# Patient Record
Sex: Male | Born: 1950 | Race: White | Hispanic: No | Marital: Married | Smoking: Never smoker
Health system: Southern US, Community
[De-identification: ages and names within clinical notes are randomized; demographics above are authoritative.]

## PROBLEM LIST (undated history)

## (undated) DIAGNOSIS — B159 Hepatitis A without hepatic coma: Secondary | ICD-10-CM

## (undated) DIAGNOSIS — G43909 Migraine, unspecified, not intractable, without status migrainosus: Secondary | ICD-10-CM

## (undated) DIAGNOSIS — E785 Hyperlipidemia, unspecified: Secondary | ICD-10-CM

## (undated) DIAGNOSIS — I10 Essential (primary) hypertension: Secondary | ICD-10-CM

## (undated) DIAGNOSIS — E119 Type 2 diabetes mellitus without complications: Secondary | ICD-10-CM

## (undated) DIAGNOSIS — J342 Deviated nasal septum: Secondary | ICD-10-CM

## (undated) DIAGNOSIS — K635 Polyp of colon: Secondary | ICD-10-CM

## (undated) DIAGNOSIS — N400 Enlarged prostate without lower urinary tract symptoms: Secondary | ICD-10-CM

## (undated) DIAGNOSIS — B009 Herpesviral infection, unspecified: Secondary | ICD-10-CM

## (undated) HISTORY — DX: Essential (primary) hypertension: I10

## (undated) HISTORY — PX: NASAL SEPTUM SURGERY: SHX37

## (undated) HISTORY — DX: Migraine, unspecified, not intractable, without status migrainosus: G43.909

## (undated) HISTORY — DX: Hepatitis a without hepatic coma: B15.9

## (undated) HISTORY — DX: Herpesviral infection, unspecified: B00.9

## (undated) HISTORY — DX: Hyperlipidemia, unspecified: E78.5

## (undated) HISTORY — DX: Type 2 diabetes mellitus without complications: E11.9

## (undated) HISTORY — DX: Deviated nasal septum: J34.2

## (undated) HISTORY — DX: Benign prostatic hyperplasia without lower urinary tract symptoms: N40.0

## (undated) HISTORY — DX: Polyp of colon: K63.5

---

## 2010-02-01 LAB — HM COLONOSCOPY

## 2011-12-20 DIAGNOSIS — N400 Enlarged prostate without lower urinary tract symptoms: Secondary | ICD-10-CM

## 2011-12-20 HISTORY — DX: Benign prostatic hyperplasia without lower urinary tract symptoms: N40.0

## 2012-12-26 ENCOUNTER — Other Ambulatory Visit: Payer: Self-pay

## 2012-12-26 ENCOUNTER — Other Ambulatory Visit: Payer: Self-pay | Admitting: Family Medicine

## 2012-12-26 LAB — COMPLETE METABOLIC PANEL WITH GFR
ALT: 53 U/L (ref 0–53)
AST: 46 U/L — ABNORMAL HIGH (ref 0–37)
Albumin: 4.5 g/dL (ref 3.5–5.2)
Alkaline Phosphatase: 36 U/L — ABNORMAL LOW (ref 39–117)
BUN: 22 mg/dL (ref 6–23)
CO2: 24 mEq/L (ref 19–32)
Calcium: 10.5 mg/dL (ref 8.4–10.5)
Chloride: 105 mEq/L (ref 96–112)
Creat: 1.23 mg/dL (ref 0.50–1.35)
GFR, Est African American: 74 mL/min
GFR, Est Non African American: 64 mL/min
Glucose, Bld: 72 mg/dL (ref 70–99)
Potassium: 4.3 mEq/L (ref 3.5–5.3)
Sodium: 141 mEq/L (ref 135–145)
Total Bilirubin: 0.5 mg/dL (ref 0.3–1.2)
Total Protein: 7.7 g/dL (ref 6.0–8.3)

## 2012-12-26 LAB — CBC WITH DIFFERENTIAL/PLATELET
Basophils Absolute: 0 10*3/uL (ref 0.0–0.1)
Basophils Relative: 0 % (ref 0–1)
Eosinophils Absolute: 0.2 10*3/uL (ref 0.0–0.7)
Eosinophils Relative: 3 % (ref 0–5)
HCT: 39.5 % (ref 39.0–52.0)
Hemoglobin: 13.1 g/dL (ref 13.0–17.0)
Lymphocytes Relative: 24 % (ref 12–46)
Lymphs Abs: 1.7 10*3/uL (ref 0.7–4.0)
MCH: 28.5 pg (ref 26.0–34.0)
MCHC: 33.2 g/dL (ref 30.0–36.0)
MCV: 85.9 fL (ref 78.0–100.0)
Monocytes Absolute: 0.6 10*3/uL (ref 0.1–1.0)
Monocytes Relative: 8 % (ref 3–12)
Neutro Abs: 4.8 10*3/uL (ref 1.7–7.7)
Neutrophils Relative %: 65 % (ref 43–77)
Platelets: 311 10*3/uL (ref 150–400)
RBC: 4.6 MIL/uL (ref 4.22–5.81)
RDW: 14.1 % (ref 11.5–15.5)
WBC: 7.3 10*3/uL (ref 4.0–10.5)

## 2012-12-26 LAB — HEMOGLOBIN A1C
Hgb A1c MFr Bld: 8.6 % — ABNORMAL HIGH (ref <5.7)
Mean Plasma Glucose: 200 mg/dL — ABNORMAL HIGH (ref <117)

## 2012-12-27 LAB — VITAMIN D 25 HYDROXY (VIT D DEFICIENCY, FRACTURES): Vit D, 25-Hydroxy: 40 ng/mL (ref 30–89)

## 2012-12-28 ENCOUNTER — Encounter: Payer: Self-pay | Admitting: *Deleted

## 2013-01-02 ENCOUNTER — Ambulatory Visit (INDEPENDENT_AMBULATORY_CARE_PROVIDER_SITE_OTHER): Payer: BC Managed Care – PPO | Admitting: Family Medicine

## 2013-01-02 ENCOUNTER — Encounter: Payer: Self-pay | Admitting: Family Medicine

## 2013-01-02 VITALS — BP 118/76 | HR 89 | Ht 72.0 in | Wt 209.0 lb

## 2013-01-02 DIAGNOSIS — E663 Overweight: Secondary | ICD-10-CM

## 2013-01-02 DIAGNOSIS — E785 Hyperlipidemia, unspecified: Secondary | ICD-10-CM

## 2013-01-02 DIAGNOSIS — N400 Enlarged prostate without lower urinary tract symptoms: Secondary | ICD-10-CM

## 2013-01-02 DIAGNOSIS — IMO0001 Reserved for inherently not codable concepts without codable children: Secondary | ICD-10-CM

## 2013-01-02 DIAGNOSIS — I10 Essential (primary) hypertension: Secondary | ICD-10-CM

## 2013-01-02 MED ORDER — INSULIN DETEMIR 100 UNIT/ML ~~LOC~~ SOLN: SUBCUTANEOUS | Status: DC

## 2013-01-02 MED ORDER — DAPAGLIFLOZIN PROPANEDIOL 10 MG PO TABS: 10.0000 mg | ORAL_TABLET | Freq: Every day | ORAL | Status: DC

## 2013-01-02 MED ORDER — FENOFIBRATE 160 MG PO TABS: 160.0000 mg | ORAL_TABLET | Freq: Every day | ORAL | Status: DC

## 2013-01-02 MED ORDER — SILODOSIN 8 MG PO CAPS: 8.0000 mg | ORAL_CAPSULE | Freq: Every day | ORAL | Status: AC

## 2013-01-02 MED ORDER — ATORVASTATIN CALCIUM 80 MG PO TABS: 80.0000 mg | ORAL_TABLET | Freq: Every day | ORAL | Status: DC

## 2013-01-02 MED ORDER — LISINOPRIL 20 MG PO TABS: 20.0000 mg | ORAL_TABLET | Freq: Every day | ORAL | Status: DC

## 2013-01-02 MED ORDER — PIOGLITAZONE HCL 45 MG PO TABS: 45.0000 mg | ORAL_TABLET | Freq: Every day | ORAL | Status: DC

## 2013-01-02 MED ORDER — SITAGLIPTIN PHOS-METFORMIN HCL 50-1000 MG PO TABS: 1.0000 | ORAL_TABLET | Freq: Two times a day (BID) | ORAL | Status: DC

## 2013-01-02 MED ORDER — NATEGLINIDE 120 MG PO TABS: 120.0000 mg | ORAL_TABLET | Freq: Three times a day (TID) | ORAL | Status: DC

## 2013-01-02 NOTE — Patient Instructions (Addendum)
1)  Type II DM- Your A1c is too high so you need to work on diet and exercise more.  Let's try adding some [place].  Start with 5 mg and increase to 10 mg. We'll recheck your A1c in 3.5 months.  Also look into the V-Go system.     Diabetes and Exercise Regular exercise is important and can help:   Control blood glucose (sugar).  Decrease blood pressure.    Control blood lipids (cholesterol, triglycerides).  Improve overall health. BENEFITS FROM EXERCISE  Improved fitness.  Improved flexibility.  Improved endurance.  Increased bone density.  Weight control.  Increased muscle strength.  Decreased body fat.  Improvement of the body's use of insulin, a hormone.  Increased insulin sensitivity.  Reduction of insulin needs.  Reduced stress and tension.  Helps you feel better. People with diabetes who add exercise to their lifestyle gain additional benefits, including:  Weight loss.  Reduced appetite.  Improvement of the body's use of blood glucose.  Decreased risk factors for heart disease:  Lowering of cholesterol and triglycerides.  Raising the level of good cholesterol (high-density lipoproteins, HDL).  Lowering blood sugar.  Decreased blood pressure. TYPE 1 DIABETES AND EXERCISE  Exercise will usually lower your blood glucose.  If blood glucose is greater than 240 mg/dl, check urine ketones. If ketones are present, do not exercise.  Location of the insulin injection sites may need to be adjusted with exercise. Avoid injecting insulin into areas of the body that will be exercised. For example, avoid injecting insulin into:  The arms when playing tennis.  The legs when jogging. For more information, discuss this with your caregiver.  Keep a record of:  Food intake.  Type and amount of exercise.  Expected peak times of insulin action.  Blood glucose levels. Do this before, during, and after exercise. Review your records with your caregiver.  This will help you to develop guidelines for adjusting food intake and insulin amounts.  TYPE 2 DIABETES AND EXERCISE  Regular physical activity can help control blood glucose.  Exercise is important because it may:  Increase the body's sensitivity to insulin.  Improve blood glucose control.  Exercise reduces the risk of heart disease. It decreases serum cholesterol and triglycerides. It also lowers blood pressure.  Those who take insulin or oral hypoglycemic agents should watch for signs of hypoglycemia. These signs include dizziness, shaking, sweating, chills, and confusion.  Body water is lost during exercise. It must be replaced. This will help to avoid loss of body fluids (dehydration) or heat stroke. Be sure to talk to your caregiver before starting an exercise program to make sure it is safe for you. Remember, any activity is better than none.  Document Released: 11/27/2003 Document Revised: 11/29/2011 Document Reviewed: 03/13/2009 [facility] Patient Information 2013 [place], [place].

## 2013-01-02 NOTE — Progress Notes (Signed)
Subjective:     Patient ID: Nicholas [name], male   DOB: 1951/04/14, 62 y.o.   MRN: [id number]  HPI Nicholas [name] is here today to discuss the medical conditions listed below.  We are going over his most recent lab results.  Overall, he has done well since his last office visit.  He needs some of his medications refilled.   1)  Hyperlipidemia - He is taking his Lipitor and fenofibrate without difficulty.    2)  Type II DM - He has not been working on his diet as much as he knows that he should.   3)  Stress - He has less stress at home since his daughter and granddaughter have moved back to [place] but he has more stress at work.     4)  Hypertension - His pressure is well controlled on lisinopril.    Review of Systems  Constitutional: Positive for activity change (He has not been as active this winter.  ) and fatigue.  Respiratory: Negative for chest tightness and shortness of breath.   Cardiovascular: Negative for chest pain and leg swelling.  Gastrointestinal: Negative for abdominal pain and blood in stool.  Endocrine: Negative for polydipsia, polyphagia and polyuria.  Genitourinary: Negative for urgency and frequency.  Musculoskeletal: Negative for joint swelling and arthralgias.  Skin: Negative for color change.  Neurological: Negative for numbness.  Hematological: Negative.   Psychiatric/Behavioral:       He has a lot of stress at work.         Objective:   Physical Exam  Constitutional: Vital signs are normal. He appears well-nourished. No distress.  HENT:  Head: Normocephalic.  Eyes: Conjunctivae are normal. No scleral icterus.  Neck: No thyromegaly present.  Cardiovascular: Normal rate, regular rhythm and normal heart sounds.   Pulmonary/Chest: Effort normal and breath sounds normal.  Abdominal: Distention: He carries his weight in his belly.    Musculoskeletal: He exhibits no edema and no tenderness.  Neurological: He is alert.  Skin: Skin is warm and dry.  Psychiatric: His  behavior is normal. Judgment and thought content normal.  Stressed        Assessment:     Type II Diabetes Hyperlipidemia HTN BPH    Plan:   1)  His A1c is higher than last check (8.5 vs 7.7).  He was given refills for Janumet, Starlix, Actos and  Levemir.  We'll add some Farxiga to help get his sugar under better control.  We also discussed the V-Go system.  He was given information to review the system.     2)  Refilled his Lipitor.    3)  Refilled the [place].    4)  He is to work harder on his diet and exercise.    TIME 30 MINUTES:  MORE THAN 50 % OF TIME WAS INVOLVED IN COUNSELING.

## 2013-01-05 ENCOUNTER — Encounter: Payer: Self-pay | Admitting: Family Medicine

## 2013-01-05 DIAGNOSIS — N400 Enlarged prostate without lower urinary tract symptoms: Secondary | ICD-10-CM | POA: Insufficient documentation

## 2013-01-05 DIAGNOSIS — I10 Essential (primary) hypertension: Secondary | ICD-10-CM | POA: Insufficient documentation

## 2013-01-05 DIAGNOSIS — IMO0001 Reserved for inherently not codable concepts without codable children: Secondary | ICD-10-CM | POA: Insufficient documentation

## 2013-01-05 DIAGNOSIS — E663 Overweight: Secondary | ICD-10-CM | POA: Insufficient documentation

## 2013-01-25 ENCOUNTER — Other Ambulatory Visit: Payer: Self-pay | Admitting: Family Medicine

## 2013-01-31 ENCOUNTER — Other Ambulatory Visit: Payer: Self-pay | Admitting: *Deleted

## 2013-01-31 MED ORDER — INSULIN PEN NEEDLE 32G X 6 MM MISC: 100.0000 | Freq: Four times a day (QID) | Status: DC

## 2013-04-03 ENCOUNTER — Other Ambulatory Visit: Payer: Self-pay | Admitting: *Deleted

## 2013-04-03 DIAGNOSIS — E785 Hyperlipidemia, unspecified: Secondary | ICD-10-CM

## 2013-04-04 ENCOUNTER — Other Ambulatory Visit: Payer: BC Managed Care – PPO

## 2013-04-05 LAB — LIPID PANEL
Cholesterol: 84 mg/dL (ref 0–200)
HDL: 29 mg/dL — ABNORMAL LOW (ref >39)
LDL Cholesterol: 34 mg/dL (ref 0–99)
Total CHOL/HDL Ratio: 2.9 Ratio
Triglycerides: 107 mg/dL (ref <150)
VLDL: 21 mg/dL (ref 0–40)

## 2013-04-05 LAB — COMPLETE METABOLIC PANEL WITH GFR
ALT: 43 U/L (ref 0–53)
AST: 39 U/L — ABNORMAL HIGH (ref 0–37)
Albumin: 4.3 g/dL (ref 3.5–5.2)
Alkaline Phosphatase: 31 U/L — ABNORMAL LOW (ref 39–117)
BUN: 23 mg/dL (ref 6–23)
CO2: 26 mEq/L (ref 19–32)
Calcium: 10.2 mg/dL (ref 8.4–10.5)
Chloride: 105 mEq/L (ref 96–112)
Creat: 1.1 mg/dL (ref 0.50–1.35)
GFR, Est African American: 84 mL/min
GFR, Est Non African American: 73 mL/min
Glucose, Bld: 53 mg/dL — ABNORMAL LOW (ref 70–99)
Potassium: 4.6 mEq/L (ref 3.5–5.3)
Sodium: 140 mEq/L (ref 135–145)
Total Bilirubin: 0.5 mg/dL (ref 0.3–1.2)
Total Protein: 7.3 g/dL (ref 6.0–8.3)

## 2013-04-12 ENCOUNTER — Ambulatory Visit (INDEPENDENT_AMBULATORY_CARE_PROVIDER_SITE_OTHER): Payer: BC Managed Care – PPO | Admitting: Family Medicine

## 2013-04-12 ENCOUNTER — Encounter: Payer: Self-pay | Admitting: Family Medicine

## 2013-04-12 VITALS — BP 122/75 | HR 87 | Ht 72.0 in | Wt 195.0 lb

## 2013-04-12 DIAGNOSIS — B009 Herpesviral infection, unspecified: Secondary | ICD-10-CM

## 2013-04-12 DIAGNOSIS — E663 Overweight: Secondary | ICD-10-CM

## 2013-04-12 DIAGNOSIS — R7401 Elevation of levels of liver transaminase levels: Secondary | ICD-10-CM

## 2013-04-12 DIAGNOSIS — E119 Type 2 diabetes mellitus without complications: Secondary | ICD-10-CM | POA: Insufficient documentation

## 2013-04-12 DIAGNOSIS — IMO0001 Reserved for inherently not codable concepts without codable children: Secondary | ICD-10-CM

## 2013-04-12 DIAGNOSIS — E785 Hyperlipidemia, unspecified: Secondary | ICD-10-CM

## 2013-04-12 DIAGNOSIS — I10 Essential (primary) hypertension: Secondary | ICD-10-CM

## 2013-04-12 LAB — POCT GLYCOSYLATED HEMOGLOBIN (HGB A1C): Hemoglobin A1C: 6.3

## 2013-04-12 MED ORDER — FAMCICLOVIR 500 MG PO TABS: ORAL_TABLET | ORAL | Status: DC

## 2013-04-12 MED ORDER — CANAGLIFLOZIN 300 MG PO TABS: 300.0000 | ORAL_TABLET | Freq: Every day | ORAL | Status: DC

## 2013-04-12 NOTE — Assessment & Plan Note (Signed)
He has done great with his weight loss on the NutraSystem program.  He is going to continue with this for another 3 months.

## 2013-04-12 NOTE — Assessment & Plan Note (Signed)
His BP is very well controlled on the Zestril.  If he continues to lose weight, he may need to decrease his dosage to keep from getting lightheaded.  He will do this if needed.

## 2013-04-12 NOTE — Assessment & Plan Note (Signed)
His AST was elevated to 46 at his last check in April.  It is down today to 39 which could be due to his improving his diet and weight loss.  I asked about his alcohol use and he says that he only drinks occasionally.  We discussed that the elevation could be due to a "fatty liver" which may get worse when he stops the Actos.  We'll recheck his level in 3 months.

## 2013-04-12 NOTE — Assessment & Plan Note (Signed)
Refilled his Famvir.

## 2013-04-12 NOTE — Patient Instructions (Addendum)
1)  Type II DM - Hold the Actos and Levemir and change from [place] to Invokana 300 mg daily.     Diabetes and Standards of Medical Care  Diabetes is complicated. You may find that your diabetes team includes a dietitian, nurse, diabetes educator, eye doctor, and more. To help everyone know what is going on and to help you get the care you deserve, the following schedule of care was developed to help keep you on track. Below are the tests, exams, vaccines, medicines, education, and plans you will need. A1c test  Performed at least 2 times a year if you are meeting treatment goals.  Performed 4 times a year if therapy has changed or if you are not meeting treatment goals. Blood pressure test  Performed at every routine medical visit. The goal is less than 120/80 mmHg. Dental exam  Follow up with the dentist regularly. Eye exam  Diagnosed with type 1 diabetes as a child: Get an exam upon reaching the age of 10 years or older and having had diabetes for 3 5 years. Yearly eye exams are recommended after that initial eye exam.  Diagnosed with type 1 diabetes as an adult: Get an exam within 5 years of diagnosis and then yearly.  Diagnosed with type 2 diabetes: Get an exam as soon as possible after the diagnosis and then yearly. Foot care exam  Visual foot exams are performed at every routine medical visit. The exams check for cuts, injuries, or other problems with the feet.  A comprehensive foot exam should be done yearly. This includes visual inspection as well as assessing foot pulses and testing for loss of sensation. Kidney function test (urine microalbumin)  Performed once a year.  Type 1 diabetes: The first test is performed 5 years after diagnosis.  Type 2 diabetes: The first test is performed at the time of diagnosis.  A serum creatinine and estimated glomerular filtration rate (eGFR) test is done once a year to tell the level of chronic kidney disease (CKD), if present. Lipid  profile (Cholesterol, HDL, LDL, Triglycerides)  Performed every 5 years for most people.  The goal for LDL is less than 100 mg/dl. If at high risk, the goal is less than 70 mg/dl.  The goal for HDL is 40 mg/dl 50 mg/dl for men and 50 mg/dl 60 mg/dl for women. An HDL cholesterol of 60 mg/dL or higher gives some protection against heart disease.  The goal for triglycerides is less than 150 mg/dl. Influenza vaccine, pneumococcal vaccine, and hepatitis B vaccine  The influenza vaccine is recommended yearly.  The pneumococcal vaccine is generally given once in a lifetime. However, there are some instances when another vaccination is recommended. Check with your caregiver.  The hepatitis B vaccine is also recommended for adults with diabetes. Diabetes self-management education  Recommended at diagnosis and ongoing as needed. Treatment plan  Reviewed at every medical visit. Document Released: 07/04/2009 Document Revised: 08/23/2012 Document Reviewed: 03/09/2011 [facility] Patient Information 2014 [place], [place].

## 2013-04-12 NOTE — Assessment & Plan Note (Signed)
His A1c is much better since adding [place] and with his watching his diet.  He admits to being nervous about Actos so he is going to hold it for now.  He is rarely using Levemir so he is going to stop this and is to be better about taking his Starlix before meals.  Since he is stopping the Actos and Levemir, we'll change him to [facility] which should give more lowering than the [place].  We'll recheck his A1c in 3 months.

## 2013-04-12 NOTE — Progress Notes (Signed)
Subjective:    Patient ID: Nicholas [name], male    DOB: November 08, 1950, 62 y.o.   MRN: [id number]  HPI  Nicholas [name] is here today to go over his most recent lab results, get medication refills and to follow up on the conditions listed below:  1)  Type II DM:  He is taking a combination of Starlix, Farxiga, Actos and Janumet.  He is only taking his Levemir if he knows that he is going to be eating more carbs than usual.  He is currently following a Nutra-System program for weight loss and has done well with it.    2)  HSV:  He needs a refill on his Famvir which he takes for fever blisters.  3)  Hypertension: His blood pressure continues to be well controlled with his lisinopril.   4)  Cholesterol:  He is taking a combination of Lipitor 80 mg every other day and Fenofibrate daily.     Review of Systems  Constitutional: Negative for fatigue and unexpected weight change (He has lost weight due to improving his diet and increasing his exercise.  ).  HENT: Negative.   Eyes: Positive for visual disturbance (He is up to date on his eye exam).  Respiratory: Negative for chest tightness and shortness of breath.   Cardiovascular: Negative for chest pain, palpitations and leg swelling.  Gastrointestinal: Negative.   Endocrine: Negative for polydipsia, polyphagia and polyuria.  Genitourinary: Negative.   Musculoskeletal: Negative.   Skin: Negative.   Neurological: Negative for light-headedness.  Psychiatric/Behavioral: Negative.    Past Medical History  Diagnosis Date  . Diabetes mellitus without complication     Eye exam - 2011  . Migraine headache   . Hyperlipidemia   . Hypertension   . Deviated septum   . Hepatitis A   . Colon polyp     Dr. Vernell Barrier, it was recommended that he have a follow-up colonoscopy in 2010.  Marland Kitchen BPH (benign prostatic hypertrophy) 12/20/2011    Dr. Cleatrice Burke  . Acute upper respiratory infections of other multiple sites   . HSV-1 (herpes simplex virus 1) infection     Family  History  Problem Relation Age of Onset  . Diabetes Mother   . Neuropathy Mother   . Diabetes Father   . Cancer Father     Lung Cancer  . Diabetes Sister   . Diabetes Brother    History   Social History Narrative   Marital Status:  Married Financial planner)    Children:  Daughter (Nicholas [name]) Sons (Nicholas [name], Nicholas [name])   Pets:  Dogs (2)    Living Situation: Lives with spouse, daughter and granddaughter    Occupation: Psychologist, sport and exercise   Education:  Engineer, maintenance (IT)   Tobacco Use/Exposure:  None    Alcohol Use:  Occasional   Drug Use:  None   Diet:  Regular   Exercise:  None   Hobbies:  Computers      Objective:   Physical Exam  Constitutional: He appears well-developed and well-nourished. No distress.  HENT:  Head: Normocephalic.  Nose: Nose normal.  Mouth/Throat: Oropharynx is clear and moist.  Eyes: Conjunctivae are normal. No scleral icterus.  Neck: Neck supple. No thyromegaly present.  Cardiovascular: Normal rate, regular rhythm and normal heart sounds.   Pulmonary/Chest: Effort normal and breath sounds normal.  Abdominal: Soft. He exhibits no mass. There is no tenderness.  Musculoskeletal: Normal range of motion. He exhibits no edema and no tenderness.  Lymphadenopathy:    He has no cervical  adenopathy.  Neurological: He is alert.  Skin: Skin is warm and dry. No rash noted.  Psychiatric: He has a normal mood and affect. His behavior is normal. Judgment and thought content normal.       Assessment & Plan:

## 2013-04-12 NOTE — Assessment & Plan Note (Signed)
His lipid panel is much lower than it needs to be.  He is currently taking the Lipitor QOD along with the fenofibrate daily.  We discussed him taking the Lipitor 1 per week.  He'll do this and we'll see what his level is at his next check.

## 2013-04-13 LAB — MICROALBUMIN, URINE: Microalb, Ur: 0.5 mg/dL (ref 0.00–1.89)

## 2013-06-25 ENCOUNTER — Other Ambulatory Visit: Payer: Self-pay | Admitting: Family Medicine

## 2013-07-17 ENCOUNTER — Encounter: Payer: Self-pay | Admitting: Family Medicine

## 2013-07-17 ENCOUNTER — Ambulatory Visit (INDEPENDENT_AMBULATORY_CARE_PROVIDER_SITE_OTHER): Payer: BC Managed Care – PPO | Admitting: Family Medicine

## 2013-07-17 VITALS — BP 109/74 | HR 89 | Resp 16 | Wt 200.0 lb

## 2013-07-17 DIAGNOSIS — Z23 Encounter for immunization: Secondary | ICD-10-CM | POA: Insufficient documentation

## 2013-07-17 DIAGNOSIS — E785 Hyperlipidemia, unspecified: Secondary | ICD-10-CM

## 2013-07-17 DIAGNOSIS — R7401 Elevation of levels of liver transaminase levels: Secondary | ICD-10-CM

## 2013-07-17 DIAGNOSIS — E663 Overweight: Secondary | ICD-10-CM

## 2013-07-17 DIAGNOSIS — IMO0001 Reserved for inherently not codable concepts without codable children: Secondary | ICD-10-CM

## 2013-07-17 LAB — POCT GLYCOSYLATED HEMOGLOBIN (HGB A1C): Hemoglobin A1C: 6.8

## 2013-07-17 MED ORDER — DAPAGLIFLOZIN PROPANEDIOL 10 MG PO TABS: 10.0000 mg | ORAL_TABLET | Freq: Every day | ORAL | Status: DC

## 2013-07-17 MED ORDER — ATORVASTATIN CALCIUM 80 MG PO TABS: 80.0000 mg | ORAL_TABLET | Freq: Every day | ORAL | Status: DC

## 2013-07-17 MED ORDER — NATEGLINIDE 120 MG PO TABS: 120.0000 mg | ORAL_TABLET | Freq: Three times a day (TID) | ORAL | Status: DC

## 2013-07-17 MED ORDER — SITAGLIPTIN PHOS-METFORMIN HCL 50-1000 MG PO TABS: 1.0000 | ORAL_TABLET | Freq: Two times a day (BID) | ORAL | Status: DC

## 2013-07-17 MED ORDER — CANAGLIFLOZIN 300 MG PO TABS: 300.0000 | ORAL_TABLET | Freq: Every day | ORAL | Status: DC

## 2013-07-17 NOTE — Assessment & Plan Note (Signed)
We discussed bariatric surgery options.  If he could qualify for the sleeve gastrectomy, this would essentially cure his diabetes.  He will look into this.

## 2013-07-17 NOTE — Assessment & Plan Note (Signed)
The plan was to recheck his LFTs but we decided to wait another 3 months so he can get back on his diet.

## 2013-07-17 NOTE — Assessment & Plan Note (Signed)
Refilled his atorvastatin.

## 2013-07-17 NOTE — Patient Instructions (Signed)
1)  Blood Sugar - You have proven to yourself that if you are really good with diet and exercise then you can control your weight and sugar.  The plan for the next 3 months is Janumet 50/1000 2 x a day plus Starlix 3 x per day and either Farxiga 10 mg or Invokana 300 mg.  You may want to consider reading Dr. Andris Baumann book on "The End of Diabetes".  Try to exercise at least 1 hour per day.  We can recheck your A1c in 3 months.  If you do follow Dr. Rebekah Chesterfield plan, you will definitely need to decrease or even stop your diabetes medications.  Order to remove Starlix, Janumet then [place] or Invokana.     Diabetes and Exercise Diabetes mellitus is a common, chronic disease, in which the pancreas is unable to adequately control blood glucose (sugar) levels. There are 2 types of diabetes. Type 1 diabetes patients are unable to produce insulin, a hormone that causes sugar in the blood to be stored in the body. People with type 1 diabetes may compensate by giving themselves injections of insulin. Type 2 diabetes involves not producing adequate amounts of insulin to control blood glucose levels. People with type 2 diabetes control their blood glucose by monitoring their food intake or by taking medicine. Exercise is an important part of diabetes treatment. During exercise, the muscles use a greater amount of glucose from the blood for energy. This lowers your blood glucose, which is the same effect you would get from taking insulin. It has been shown that endurance athletes are more sensitive to insulin than inactive people. SYMPTOMS  Many people with a mild case of diabetes have no symptoms. However, if left uncontrolled, diabetes can lead to several complications that could be prevented with treatment of the disease. General symptoms of diabetes include:   Frequent urination (polyuria).  Frequent thirst and drinking (polydipsia).  Increased food consumption (polyphagia).  Fatigue.  Poor exercise  performance.  Blurred vision.  Inflammation of the vagina (vaginitis) caused by fungal infections.  Skin infections (uncommon).  Numbness in the feet,caused by nerve injury.  Kidney disease. CAUSES  The cause of most cases of diabetes is unknown. In children, diabetes is often due to an autoimmune response to the cells in the pancreas that make insulin. It is also linked with other diseases, such as cystic fibrosis. Diabetes may have a genetic link. PREVENTION  Athletes should strive to begin exercise with blood glucose in a well-controlled state.  Feet should always be kept clean and dry.  Activities in which low blood sugar levels cannot be treated easily (scuba diving, rock climbing, swimming) should be avoided.  Anticipate alterations in diet or training to avoid low blood sugar (hypoglycemia) and high blood sugar (hyperglycemia).  Athletes should try to increase sugar consumption after strenuous exercise to avoid hypoglycemia.  Short-acting insulin should not be injected into an actively exercising muscle. The athlete should rest the injection site for about 1 hour after exercise.  Patients with diabetes should get routine checkups of the feet to prevent complications. PROGNOSIS  Exercise provides many benefits to the person with diabetes:   Reduced body fat.  Lower blood pressure.  Often, reduced need for medicines.  Improved exercise tolerance.  Lower insulin levels.  Weight loss.  Improved lipid profile (decreased cholesterol and low-density lipoproteins). RELATED COMPLICATIONS  If performed incorrectly, exercise can result in complications of diabetes:   Poor control of blood sugar, when exercise is performed at  the wrong time.  Increase in renal disease, from loss of body fluids (dehydration).  Increased risk of nerve injury (neuropathy) when performing exercises that increase foot injury.  Increased risk of eye problems when performingactivities that  involve breath holding or lowering or jarring the head.  Increased risk of sudden death from exercise in patients with heart disease.  Worsening of hypertension with heavy lifting (more than 10 lb/4.5 kg). Altered blood glucose and insulin dose as a result of mild illness that produces loss of appetite.  Altered uptake of insulin after injection when insulin injection site is changed. NOTE: Exercise can lower blood glucose effectively, but the effects are short-lasting (no more than a couple of days). Exercise has been shown to improve your sensitivity to insulin. This may alter how your body responds to a given dose of injected insulin. It is important for every patient with diabetes to know how his or her body may react to exercise, and to adjust insulin dosages accordingly. TREATMENT  Eat about 1 to 3 hours before exercise.  Check blood glucose immediately before and after exercise.  Stop exercise if blood glucose is more than 250 mg/dL.  Stop exercise if blood glucose is less than 100 mg/dL.  Do not exercise within 1 hour of an insulin injection.  Be prepared to treat low blood glucose while exercising. Keep some sugar product with you, such as a candy bar.  For prolonged exercise, use a sports drink to maintain your glucose level.  Replace used up glucose in the body after exercise.  Consume fluids during and after exercise to avoid dehydration. SEEK MEDICAL CARE IF:  You have vision changes after a run.  You notice a loss of sensation in your feet after exercise.  You have increased numbness, tingling, or pins and needles sensations after exercise.  You have chest pain during or after exercise.  You have a fast, irregular heartbeat (palpitations) during or after exercise.  Your exercise tolerance gets worse.  You have fainting or dizzy spells for brief periods during or after exercise. Document Released: 09/06/2005 Document Revised: 11/29/2011 Document Reviewed:  12/19/2008 [facility] Patient Information 2014 [place], [place].

## 2013-07-17 NOTE — Progress Notes (Signed)
Subjective:    Patient ID: Nicholas [name], male    DOB: 01-16-1951, 62 y.o.   MRN: [id number]  HPI  Nicholas [name] is here today to discuss his Type II Diabetes.  At his last visit, he was eating Nutrisystem and had done great with weight loss and improving his sugars.  His A1c had dropped to 6.3% and he got down to 195 lbs.  He was unable to continue on the program due to some other financial obligations and has gained back some weight (5 lbs).  The plan at his last visit (assuming he was going to continue on his diet) was that he was going to stop the Levemir and Actos and change from [place] to [place].  Since he did not continue on his diet and he had a lot of Actos, he continued taking it in addition to the Janumet 50/1000 and Farxiga 10 mg.  He tried the samples of Invokana and developed hives. He is not sure if this reaction was due to the Invokana or a laundry detergent he was using.  He has a month of Invokana and would like to try it again.     Review of Systems  Constitutional: Positive for unexpected weight change.  HENT: Negative.   Eyes: Negative.   Respiratory: Negative.   Cardiovascular: Negative.   Gastrointestinal: Negative.   Endocrine: Negative.   Genitourinary: Negative.   Musculoskeletal: Negative.   Skin: Negative.   Allergic/Immunologic: Negative.   Neurological: Negative.   Hematological: Negative.   Psychiatric/Behavioral: Negative.     Past Medical History  Diagnosis Date  . Diabetes mellitus without complication     Eye exam - 2011  . Migraine headache   . Hyperlipidemia   . Hypertension   . Deviated septum   . Hepatitis A   . Colon polyp     Dr. Vernell Barrier, it was recommended that he have a follow-up colonoscopy in 2010.  Marland Kitchen BPH (benign prostatic hypertrophy) 12/20/2011    Dr. Cleatrice Burke  . Acute upper respiratory infections of other multiple sites   . HSV-1 (herpes simplex virus 1) infection      Family History  Problem Relation Age of Onset  . Diabetes Mother    . Neuropathy Mother   . Diabetes Father   . Cancer Father     Lung Cancer  . Diabetes Sister   . Diabetes Brother     History   Social History Narrative   Marital Status:  Married Financial planner)    Children:  Daughter (Meta) Sons Burman Riis, [place])   Pets:  Dogs (2)    Living Situation: Lives with spouse, daughter and granddaughter    Occupation: Psychologist, sport and exercise   Education:  Engineer, maintenance (IT)   Tobacco Use/Exposure:  None    Alcohol Use:  Occasional   Drug Use:  None   Diet:  Regular   Exercise:  None   Hobbies:  Computers       Objective:   Physical Exam  Vitals reviewed. Constitutional: He is oriented to person, place, and time. He appears well-developed and well-nourished. No distress.  HENT:  Head: Normocephalic.  Eyes: No scleral icterus.  Neck: Neck supple. No thyromegaly present.  Cardiovascular: Normal rate, regular rhythm and normal heart sounds.   Pulmonary/Chest: Effort normal and breath sounds normal.  Musculoskeletal: Normal range of motion. He exhibits no edema.  Neurological: He is alert and oriented to person, place, and time.  Skin: Skin is warm and dry. No rash noted.  Psychiatric:  He has a normal mood and affect. His behavior is normal. Judgment and thought content normal.          Assessment & Plan:

## 2013-07-17 NOTE — Assessment & Plan Note (Addendum)
His A1c has increased to 6.8%.  He is going to challenge himself again with the Invokana.  He is also going to work harder on diet and exercise.  His medications were refilled.

## 2013-08-23 ENCOUNTER — Encounter: Payer: Self-pay | Admitting: *Deleted

## 2013-10-16 ENCOUNTER — Other Ambulatory Visit: Payer: Self-pay | Admitting: *Deleted

## 2013-10-16 DIAGNOSIS — R5383 Other fatigue: Secondary | ICD-10-CM

## 2013-10-16 DIAGNOSIS — R5381 Other malaise: Secondary | ICD-10-CM

## 2013-10-16 DIAGNOSIS — I10 Essential (primary) hypertension: Secondary | ICD-10-CM

## 2013-10-16 DIAGNOSIS — E785 Hyperlipidemia, unspecified: Secondary | ICD-10-CM

## 2013-10-16 DIAGNOSIS — E119 Type 2 diabetes mellitus without complications: Secondary | ICD-10-CM

## 2013-10-17 ENCOUNTER — Other Ambulatory Visit (INDEPENDENT_AMBULATORY_CARE_PROVIDER_SITE_OTHER): Payer: BC Managed Care – PPO

## 2013-10-17 LAB — COMPLETE METABOLIC PANEL WITH GFR
ALT: 35 U/L (ref 0–53)
AST: 23 U/L (ref 0–37)
Albumin: 4.1 g/dL (ref 3.5–5.2)
Alkaline Phosphatase: 35 U/L — ABNORMAL LOW (ref 39–117)
BUN: 19 mg/dL (ref 6–23)
CO2: 27 mEq/L (ref 19–32)
Calcium: 10 mg/dL (ref 8.4–10.5)
Chloride: 105 mEq/L (ref 96–112)
Creat: 1.27 mg/dL (ref 0.50–1.35)
GFR, Est African American: 71 mL/min
GFR, Est Non African American: 61 mL/min
Glucose, Bld: 188 mg/dL — ABNORMAL HIGH (ref 70–99)
Potassium: 4.6 mEq/L (ref 3.5–5.3)
Sodium: 139 mEq/L (ref 135–145)
Total Bilirubin: 0.4 mg/dL (ref 0.2–1.2)
Total Protein: 7 g/dL (ref 6.0–8.3)

## 2013-10-17 LAB — LIPID PANEL
Cholesterol: 110 mg/dL (ref 0–200)
HDL: 22 mg/dL — ABNORMAL LOW (ref >39)
LDL Cholesterol: 20 mg/dL (ref 0–99)
Total CHOL/HDL Ratio: 5 Ratio
Triglycerides: 340 mg/dL — ABNORMAL HIGH (ref <150)
VLDL: 68 mg/dL — ABNORMAL HIGH (ref 0–40)

## 2013-10-17 LAB — TSH: TSH: 1.117 u[IU]/mL (ref 0.350–4.500)

## 2013-10-17 LAB — CBC WITH DIFFERENTIAL/PLATELET
Basophils Absolute: 0 10*3/uL (ref 0.0–0.1)
Basophils Relative: 0 % (ref 0–1)
Eosinophils Absolute: 0.1 10*3/uL (ref 0.0–0.7)
Eosinophils Relative: 2 % (ref 0–5)
HCT: 41.9 % (ref 39.0–52.0)
Hemoglobin: 14.3 g/dL (ref 13.0–17.0)
Lymphocytes Relative: 25 % (ref 12–46)
Lymphs Abs: 1.6 10*3/uL (ref 0.7–4.0)
MCH: 29.5 pg (ref 26.0–34.0)
MCHC: 34.1 g/dL (ref 30.0–36.0)
MCV: 86.6 fL (ref 78.0–100.0)
Monocytes Absolute: 0.7 10*3/uL (ref 0.1–1.0)
Monocytes Relative: 11 % (ref 3–12)
Neutro Abs: 4.1 10*3/uL (ref 1.7–7.7)
Neutrophils Relative %: 62 % (ref 43–77)
Platelets: 277 10*3/uL (ref 150–400)
RBC: 4.84 MIL/uL (ref 4.22–5.81)
RDW: 13.8 % (ref 11.5–15.5)
WBC: 6.5 10*3/uL (ref 4.0–10.5)

## 2013-10-17 LAB — HEMOGLOBIN A1C
Hgb A1c MFr Bld: 8.2 % — ABNORMAL HIGH (ref <5.7)
Mean Plasma Glucose: 189 mg/dL — ABNORMAL HIGH (ref <117)

## 2013-10-24 ENCOUNTER — Ambulatory Visit (INDEPENDENT_AMBULATORY_CARE_PROVIDER_SITE_OTHER): Payer: BC Managed Care – PPO | Admitting: Family Medicine

## 2013-10-24 ENCOUNTER — Encounter: Payer: Self-pay | Admitting: Family Medicine

## 2013-10-24 VITALS — BP 117/70 | HR 92 | Resp 16 | Ht 72.0 in | Wt 200.0 lb

## 2013-10-24 DIAGNOSIS — R202 Paresthesia of skin: Secondary | ICD-10-CM

## 2013-10-24 DIAGNOSIS — IMO0001 Reserved for inherently not codable concepts without codable children: Secondary | ICD-10-CM

## 2013-10-24 DIAGNOSIS — E1165 Type 2 diabetes mellitus with hyperglycemia: Principal | ICD-10-CM

## 2013-10-24 DIAGNOSIS — R209 Unspecified disturbances of skin sensation: Secondary | ICD-10-CM

## 2013-10-24 DIAGNOSIS — R200 Anesthesia of skin: Secondary | ICD-10-CM

## 2013-10-24 DIAGNOSIS — E781 Pure hyperglyceridemia: Secondary | ICD-10-CM

## 2013-10-24 MED ORDER — GLIPIZIDE ER 10 MG PO TB24: 10.0000 mg | ORAL_TABLET | Freq: Every day | ORAL | Status: DC

## 2013-10-24 MED ORDER — OMEGA-3-ACID ETHYL ESTERS 1 G PO CAPS: 2.0000 g | ORAL_CAPSULE | Freq: Two times a day (BID) | ORAL | Status: DC

## 2013-10-24 MED ORDER — CANAGLIFLOZIN 300 MG PO TABS: 300.0000 | ORAL_TABLET | Freq: Every day | ORAL | Status: DC

## 2013-10-24 NOTE — Progress Notes (Signed)
Subjective:    Patient ID: Nicholas [name], male    DOB: 1951-09-04, 63 y.o.   MRN: [id number]  HPI  Nicholas [name] is here today to go over her most recent lab results and discuss the conditions listed below:   1)  Type II DM - He is currently taking Farxiga (10 mg daily) and Starlix.  He is doing well with both medications.  He needs a refill on it.  He was unsure if he should continue taking Invokana.  He does not remember if he has a prescription for it.    2)  Hand Numbness - He has been experiencing numbness in his left hand and fifth digit for the past three weeks.    3)  Hyperlipidemia - He is doing fine on Lovaza and needs to have it refilled.     Review of Systems  Constitutional: Negative for fatigue and unexpected weight change.  HENT: Negative.   Respiratory: Negative for shortness of breath.   Cardiovascular: Negative for chest pain, palpitations and leg swelling.  Gastrointestinal: Negative.   Genitourinary: Negative.   Musculoskeletal: Negative for myalgias.  Skin: Negative.   Neurological: Positive for numbness.       Left hand   Psychiatric/Behavioral: Negative.     Past Medical History  Diagnosis Date  . Diabetes mellitus without complication     Eye exam - 2011  . Migraine headache   . Hyperlipidemia   . Hypertension   . Deviated septum   . Hepatitis A   . Colon polyp     Dr. Vernell Barrier, it was recommended that he have a follow-up colonoscopy in 2010.  Marland Kitchen BPH (benign prostatic hypertrophy) 12/20/2011    Dr. Cleatrice Burke  . HSV-1 (herpes simplex virus 1) infection      History reviewed. No pertinent past surgical history.   History   Social History Narrative   Marital Status:  Married Financial planner)    Children:  Daughter (Meta) Sons Burman Riis, [place])   Pets:  Dogs (2)    Living Situation: Lives with spouse   Occupation: Psychologist, sport and exercise   Education:  Engineer, maintenance (IT)   Tobacco Use/Exposure:  None    Alcohol Use:  Occasional   Drug Use:  None   Diet:  Regular     Exercise:  None   Hobbies:  Computers     Family History  Problem Relation Age of Onset  . Diabetes Mother   . Neuropathy Mother   . Diabetes Father   . Cancer Father     Lung Cancer  . Diabetes Sister   . Diabetes Brother      Current Outpatient Prescriptions on File Prior to Visit  Medication Sig Dispense Refill  . aspirin (ASPIRIN ADULT LOW STRENGTH) 81 MG EC tablet Take 81 mg by mouth daily. Swallow whole.      Marland Kitchen atorvastatin (LIPITOR) 80 MG tablet Take 1 tablet (80 mg total) by mouth daily.  90 tablet  1  . Dapagliflozin Propanediol (FARXIGA) 10 MG TABS Take 10 mg by mouth daily.  30 tablet  5  . famciclovir (FAMVIR) 500 MG tablet Take 1 tab daily for prevention or 3 tabs at onset of fever blister  30 tablet  3  . fenofibrate 160 MG tablet Take 1 tablet (160 mg total) by mouth daily.  90 tablet  3  . lisinopril (PRINIVIL,ZESTRIL) 20 MG tablet Take 1 tablet (20 mg total) by mouth daily.  90 tablet  3  . nateglinide (STARLIX) 120  MG tablet Take 1 tablet (120 mg total) by mouth 3 (three) times daily before meals.  270 tablet  1  . pioglitazone (ACTOS) 45 MG tablet Take 1 tablet (45 mg total) by mouth daily.  90 tablet  1  . silodosin (RAPAFLO) 8 MG CAPS capsule Take 1 capsule (8 mg total) by mouth daily with breakfast.  90 capsule  3  . sitaGLIPtin-metformin (JANUMET) 50-1000 MG per tablet Take 1 tablet by mouth 2 (two) times daily with a meal.  180 tablet  1   No current facility-administered medications on file prior to visit.     No Known Allergies   Immunization History  Administered Date(s) Administered  . Influenza Whole 10/01/2009, 07/09/2011, 06/27/2012  . Influenza,inj,Quad PF,36+ Mos 07/17/2013  . Td 09/24/2005  . Tdap 06/27/2012        Objective:   Physical Exam  Nursing note and vitals reviewed. Constitutional: He is oriented to person, place, and time. He appears well-nourished. No distress.  HENT:  Head: Normocephalic.  Eyes: No scleral icterus.   Neck: Neck supple. No thyromegaly present.  Cardiovascular: Normal rate, regular rhythm and normal heart sounds.  Exam reveals no gallop and no friction rub.   No murmur heard. Pulmonary/Chest: Breath sounds normal. No respiratory distress. He exhibits no tenderness.  Musculoskeletal: He exhibits no edema.  Neurological: He is alert and oriented to person, place, and time.  Skin: Skin is warm and dry. No rash noted.  Psychiatric: He has a normal mood and affect. His behavior is normal. Judgment and thought content normal.      Assessment & Plan:    Nicholas [name] was seen today for medication management.  Diagnoses and associated orders for this visit:  Type II or unspecified type diabetes mellitus without mention of complication, uncontrolled - Canagliflozin (INVOKANA) 300 MG TABS; Take 300 tablets (90,000 mg total) by mouth daily. - glipiZIDE (GLUCOTROL XL) 10 MG 24 hr tablet; Take 1 tablet (10 mg total) by mouth daily with breakfast.  High triglycerides - omega-3 acid ethyl esters (LOVAZA) 1 G capsule; Take 2 capsules (2 g total) by mouth 2 (two) times daily.  Numbness and tingling in left hand   TIME SPENT "FACE TO FACE" WITH PATIENT -  30 MINS

## 2013-10-24 NOTE — Patient Instructions (Signed)
1)  Cholesterol - Decrease your atorvastatin to 1 per week; Continue on the fenofibrate and add back the Lovaza to get your triglycerides down.    2)  Blood Sugar -  We are changing you from [place] back to Invokana and changing you from Starlix before every meal to Glucotrol XL in the am.  You will continue on Janumet 2 x per day and you may also add back the Actos 1/2 tab if you decide not to do Nutri System.  Continue to work on diet and exercise. Get your eyes checked.     Diabetes and Exercise Exercising regularly is important. It is not just about losing weight. It has many health benefits, such as:  Improving your overall fitness, flexibility, and endurance.  Increasing your bone density.  Helping with weight control.  Decreasing your body fat.  Increasing your muscle strength.  Reducing stress and tension.  Improving your overall health. People with diabetes who exercise gain additional benefits because exercise:  Reduces appetite.  Improves the body's use of blood sugar (glucose).  Helps lower or control blood glucose.  Decreases blood pressure.  Helps control blood lipids (such as cholesterol and triglycerides).  Improves the body's use of the hormone insulin by:  Increasing the body's insulin sensitivity.  Reducing the body's insulin needs.  Decreases the risk for heart disease because exercising:  Lowers cholesterol and triglycerides levels.  Increases the levels of good cholesterol (such as high-density lipoproteins [HDL]) in the body.  Lowers blood glucose levels. YOUR ACTIVITY PLAN  Choose an activity that you enjoy and set realistic goals. Your health care provider or diabetes educator can help you make an activity plan that works for you. You can break activities into 2 or 3 sessions throughout the day. Doing so is as good as one long session. Exercise ideas include:  Taking the dog for a walk.  Taking the stairs instead of the elevator.  Dancing  to your favorite song.  Doing your favorite exercise with a friend. RECOMMENDATIONS FOR EXERCISING WITH TYPE 1 OR TYPE 2 DIABETES   Check your blood glucose before exercising. If blood glucose levels are greater than 240 mg/dL, check for urine ketones. Do not exercise if ketones are present.  Avoid injecting insulin into areas of the body that are going to be exercised. For example, avoid injecting insulin into:  The arms when playing tennis.  The legs when jogging.  Keep a record of:  Food intake before and after you exercise.  Expected peak times of insulin action.  Blood glucose levels before and after you exercise.  The type and amount of exercise you have done.  Review your records with your health care provider. Your health care provider will help you to develop guidelines for adjusting food intake and insulin amounts before and after exercising.  If you take insulin or oral hypoglycemic agents, watch for signs and symptoms of hypoglycemia. They include:  Dizziness.  Shaking.  Sweating.  Chills.  Confusion.  Drink plenty of water while you exercise to prevent dehydration or heat stroke. Body water is lost during exercise and must be replaced.  Talk to your health care provider before starting an exercise program to make sure it is safe for you. Remember, almost any type of activity is better than none. Document Released: 11/27/2003 Document Revised: 05/09/2013 Document Reviewed: 02/13/2013 [facility] Patient Information 2014 [place], [place].  Insulin Resistance Blood sugar (glucose) levels are controlled by a hormone called insulin. Insulin is made by  your pancreas. When your blood glucose goes up, insulin is released into your blood. Insulin is required for your body to function normally. However, your body can become resistant to your own insulin or to insulin given to treat diabetes. In either case, insulin resistance can lead to serious problems. These problems  include:  Type 2 diabetes.  Heart disease.  High blood pressure.  Stroke.  Polycystic ovary syndrome.  Fatty liver. CAUSES  Insulin resistance can develop for many different reasons. It is more likely to happen in people with these conditions or characteristics:  Obesity.  Inactivity.  Pregnancy.  High blood pressure.  Stress.  Steroid use.  Infection or severe illness.  Increased levels of cholesterol and triglycerides. SYMPTOMS  There are no symptoms. You may have symptoms related to the various complications of insulin resistance.  DIAGNOSIS  Several different things can make your caregiver suspect you have insulin resistance. These include:  High blood glucose (hyperglycemia).  Abnormal cholesterol levels.  High uric acid levels.  Changes related to blood pressure.  Changes related to inflammation. Insulin resistance can be determined with blood tests. An elevated insulin level when you have not eaten might suggest resistance. Other more complicated tests are sometimes necessary. TREATMENT  Lifestyle changes are the most important treatment for insulin resistance.   If you are overweight and you have insulin resistance, you can improve your insulin sensitivity by losing weight.  Moderate exercise for 30 40 minutes, 4 days a week, can improve insulin sensitivity. Some medicines can also help improve your insulin sensitivity. Your caregiver can discuss these with you if they are appropriate.  HOME CARE INSTRUCTIONS   Do not smoke.  Keep your weight at a healthy level.  Get exercise.  If you have diabetes, follow your caregiver's directions.  If you have high blood pressure, follow your caregiver's directions.  Only take prescription medicines for pain, fever, or discomfort as directed by your caregiver. SEEK MEDICAL CARE IF:   You are diabetic and you are having problems keeping your blood glucose levels at target range.  You are having episodes  of low blood glucose (hypoglycemia).  You feel you might be having side effects from your medicines.  You have symptoms of an illness that is not improving after 3 4 days.  You have a sore or wound that is not healing.  You notice a change in vision or a new problem with your vision. SEEK IMMEDIATE MEDICAL CARE IF:   Your blood glucose goes below 70, especially if you have confusion, lightheadedness, or other symptoms with it.  Your blood glucose is very high (as advised by your caregiver) twice in a row.  You pass out.  You have chest pain or trouble breathing.  You have a sudden, severe headache.  You have sudden weakness in one arm or one leg.  You have sudden difficulty speaking or swallowing.  You develop vomiting or diarrhea that is getting worse or not improving after 1 day. Document Released: 10/26/2005 Document Revised: 03/07/2012 Document Reviewed: 02/15/2013 [facility] Patient Information 2014 [place], [place].

## 2013-12-03 ENCOUNTER — Other Ambulatory Visit: Payer: Self-pay | Admitting: Family Medicine

## 2014-01-22 ENCOUNTER — Encounter: Payer: Self-pay | Admitting: Family Medicine

## 2014-01-22 ENCOUNTER — Ambulatory Visit (INDEPENDENT_AMBULATORY_CARE_PROVIDER_SITE_OTHER): Payer: BC Managed Care – PPO | Admitting: Family Medicine

## 2014-01-22 ENCOUNTER — Encounter (INDEPENDENT_AMBULATORY_CARE_PROVIDER_SITE_OTHER): Payer: Self-pay

## 2014-01-22 VITALS — BP 136/76 | HR 83 | Resp 16 | Ht 73.0 in | Wt 206.0 lb

## 2014-01-22 DIAGNOSIS — E781 Pure hyperglyceridemia: Secondary | ICD-10-CM

## 2014-01-22 DIAGNOSIS — I10 Essential (primary) hypertension: Secondary | ICD-10-CM

## 2014-01-22 DIAGNOSIS — E1165 Type 2 diabetes mellitus with hyperglycemia: Secondary | ICD-10-CM

## 2014-01-22 DIAGNOSIS — E785 Hyperlipidemia, unspecified: Secondary | ICD-10-CM

## 2014-01-22 DIAGNOSIS — IMO0001 Reserved for inherently not codable concepts without codable children: Secondary | ICD-10-CM

## 2014-01-22 DIAGNOSIS — B009 Herpesviral infection, unspecified: Secondary | ICD-10-CM

## 2014-01-22 LAB — POCT GLYCOSYLATED HEMOGLOBIN (HGB A1C): Hemoglobin A1C: 7.4

## 2014-01-22 MED ORDER — PIOGLITAZONE HCL 45 MG PO TABS: 45.0000 mg | ORAL_TABLET | Freq: Every day | ORAL | Status: DC

## 2014-01-22 MED ORDER — FENOFIBRATE 160 MG PO TABS: 160.0000 mg | ORAL_TABLET | Freq: Every day | ORAL | Status: AC

## 2014-01-22 MED ORDER — LISINOPRIL 20 MG PO TABS: 20.0000 mg | ORAL_TABLET | Freq: Every day | ORAL | Status: DC

## 2014-01-22 MED ORDER — SITAGLIPTIN PHOS-METFORMIN HCL 50-1000 MG PO TABS: 1.0000 | ORAL_TABLET | Freq: Two times a day (BID) | ORAL | Status: DC

## 2014-01-22 MED ORDER — FAMCICLOVIR 500 MG PO TABS: ORAL_TABLET | ORAL | Status: AC

## 2014-01-22 NOTE — Patient Instructions (Signed)
1)  Type II DM - Your A1c has improved a little 7.4% down from 8.2%.  Consider getting Dr. Rebekah ChesterfieldFuhrman's book "The End of Diabetes" and follow it exclusively for 3 months to see what you can do about coming off of some of your medications.  Increase your exercise.    Diabetes and Exercise Exercising regularly is important. It is not just about losing weight. It has many health benefits, such as:  Improving your overall fitness, flexibility, and endurance.  Increasing your bone density.  Helping with weight control.  Decreasing your body fat.  Increasing your muscle strength.  Reducing stress and tension.  Improving your overall health. People with diabetes who exercise gain additional benefits because exercise:  Reduces appetite.  Improves the body's use of blood sugar (glucose).  Helps lower or control blood glucose.  Decreases blood pressure.  Helps control blood lipids (such as cholesterol and triglycerides).  Improves the body's use of the hormone insulin by:  Increasing the body's insulin sensitivity.  Reducing the body's insulin needs.  Decreases the risk for heart disease because exercising:  Lowers cholesterol and triglycerides levels.  Increases the levels of good cholesterol (such as high-density lipoproteins [HDL]) in the body.  Lowers blood glucose levels. YOUR ACTIVITY PLAN  Choose an activity that you enjoy and set realistic goals. Your health care provider or diabetes educator can help you make an activity plan that works for you. You can break activities into 2 or 3 sessions throughout the day. Doing so is as good as one long session. Exercise ideas include:  Taking the dog for a walk.  Taking the stairs instead of the elevator.  Dancing to your favorite song.  Doing your favorite exercise with a friend. RECOMMENDATIONS FOR EXERCISING WITH TYPE 1 OR TYPE 2 DIABETES   Check your blood glucose before exercising. If blood glucose levels are greater than  240 mg/dL, check for urine ketones. Do not exercise if ketones are present.  Avoid injecting insulin into areas of the body that are going to be exercised. For example, avoid injecting insulin into:  The arms when playing tennis.  The legs when jogging.  Keep a record of:  Food intake before and after you exercise.  Expected peak times of insulin action.  Blood glucose levels before and after you exercise.  The type and amount of exercise you have done.  Review your records with your health care provider. Your health care provider will help you to develop guidelines for adjusting food intake and insulin amounts before and after exercising.  If you take insulin or oral hypoglycemic agents, watch for signs and symptoms of hypoglycemia. They include:  Dizziness.  Shaking.  Sweating.  Chills.  Confusion.  Drink plenty of water while you exercise to prevent dehydration or heat stroke. Body water is lost during exercise and must be replaced.  Talk to your health care provider before starting an exercise program to make sure it is safe for you. Remember, almost any type of activity is better than none. Document Released: 11/27/2003 Document Revised: 05/09/2013 Document Reviewed: 02/13/2013 [facility] Patient Information 2014 [place], [place].

## 2014-01-22 NOTE — Progress Notes (Signed)
Subjective:    Patient ID: Nicholas [name], male    DOB: July 13, 1951, 63 y.o.   MRN: [id number]  HPI  Nicholas [name] is here today to get medicarion refills and to discuss the conditions listed below:   1)  Type II DM - He is currently taking Invokana and Janumet. He hasn't been checking his sugars at home.  2)  Hyperlipidemia - He is doing fine on Lovaza and Lipitor and needs to have it refilled.   3)  Blood Pressure - He is currently taking Lisinopril.  He would like refills.   4)  Fever blisters - He is currently taking Famvir  He is doing well.  He recently had a fever blister 3 weeks ago.     Review of Systems  Constitutional: Negative for fatigue and unexpected weight change.  HENT: Negative.   Respiratory: Negative for shortness of breath.   Cardiovascular: Negative for chest pain, palpitations and leg swelling.  Gastrointestinal: Negative.   Genitourinary: Negative.   Musculoskeletal: Negative for myalgias.  Skin: Negative.   Psychiatric/Behavioral: Negative.   All other systems reviewed and are negative.    Past Medical History  Diagnosis Date  . Diabetes mellitus without complication     Eye exam - 2011  . Migraine headache   . Hyperlipidemia   . Hypertension   . Deviated septum   . Hepatitis A   . Colon polyp     Dr. Vernell Barrierraelos, it was recommended that he have a follow-up colonoscopy in 2010.  Marland Kitchen. BPH (benign prostatic hypertrophy) 12/20/2011    Dr. Cleatrice Burkeoughlin  . HSV-1 (herpes simplex virus 1) infection      History reviewed. No pertinent past surgical history.   History   Social History Narrative   Marital Status:  Married Financial planner([place])    Children:  Daughter (Meta) Sons Burman Riis([place], [place])   Pets:  Dogs (2)    Living Situation: Lives with spouse   Occupation: Psychologist, sport and exerciseystem Analyst Programmer   Education:  Engineer, maintenance (IT)College Graduate   Tobacco Use/Exposure:  None    Alcohol Use:  Occasional   Drug Use:  None   Diet:  Regular   Exercise:  None   Hobbies:  Computers     Family History    Problem Relation Age of Onset  . Diabetes Mother   . Neuropathy Mother   . Diabetes Father   . Cancer Father     Lung Cancer  . Diabetes Sister   . Diabetes Brother      Current Outpatient Prescriptions on File Prior to Visit  Medication Sig Dispense Refill  . aspirin (ASPIRIN ADULT LOW STRENGTH) 81 MG EC tablet Take 81 mg by mouth daily. Swallow whole.      Marland Kitchen. atorvastatin (LIPITOR) 80 MG tablet Take 1 tablet (80 mg total) by mouth daily.  90 tablet  1  . Canagliflozin (INVOKANA) 300 MG TABS Take 300 tablets (90,000 mg total) by mouth daily.  90 tablet  1  . glipiZIDE (GLUCOTROL XL) 10 MG 24 hr tablet Take 1 tablet (10 mg total) by mouth daily with breakfast.  90 tablet  1  . omega-3 acid ethyl esters (LOVAZA) 1 G capsule Take 2 capsules (2 g total) by mouth 2 (two) times daily.  360 capsule  1   No current facility-administered medications on file prior to visit.     No Known Allergies   Immunization History  Administered Date(s) Administered  . Influenza Whole 10/01/2009, 07/09/2011, 06/27/2012  . Influenza,inj,Quad PF,36+ Mos 07/17/2013  .  Td 09/24/2005  . Tdap 06/27/2012       Objective:   Physical Exam  Nursing note and vitals reviewed. Constitutional: He is oriented to person, place, and time. He appears well-nourished. No distress.  HENT:  Head: Normocephalic.  Eyes: No scleral icterus.  Neck: Neck supple. No thyromegaly present.  Cardiovascular: Normal rate, regular rhythm and normal heart sounds.  Exam reveals no gallop and no friction rub.   No murmur heard. Pulmonary/Chest: Breath sounds normal. No respiratory distress. He exhibits no tenderness.  Musculoskeletal: He exhibits no edema.  Neurological: He is alert and oriented to person, place, and time.  Skin: Skin is warm and dry. No rash noted.  Psychiatric: He has a normal mood and affect. His behavior is normal. Judgment and thought content normal.      Assessment & Plan:  Nicholas [name] was seen today for  medication management.  Diagnoses and associated orders for this visit:  Other and unspecified hyperlipidemia  Essential hypertension, benign - lisinopril (PRINIVIL,ZESTRIL) 20 MG tablet; Take 1 tablet (20 mg total) by mouth daily.  HSV-1 infection - famciclovir (FAMVIR) 500 MG tablet; Take 1 tab daily for prevention or 3 tabs at onset of fever blister  Type II or unspecified type diabetes mellitus without mention of complication, uncontrolled - POCT HgB A1C - sitaGLIPtin-metformin (JANUMET) 50-1000 MG per tablet; Take 1 tablet by mouth 2 (two) times daily with a meal. - pioglitazone (ACTOS) 45 MG tablet; Take 1 tablet (45 mg total) by mouth daily.  High triglycerides - fenofibrate 160 MG tablet; Take 1 tablet (160 mg total) by mouth daily.   TIME SPENT "FACE TO FACE" WITH PATIENT -  30 MINS

## 2014-03-21 ENCOUNTER — Other Ambulatory Visit: Payer: Self-pay | Admitting: *Deleted

## 2014-03-21 DIAGNOSIS — I10 Essential (primary) hypertension: Secondary | ICD-10-CM

## 2014-03-21 DIAGNOSIS — E785 Hyperlipidemia, unspecified: Secondary | ICD-10-CM

## 2014-03-25 ENCOUNTER — Other Ambulatory Visit: Payer: BC Managed Care – PPO

## 2014-03-25 LAB — COMPLETE METABOLIC PANEL WITH GFR
ALT: 35 U/L (ref 0–53)
AST: 24 U/L (ref 0–37)
Albumin: 4.5 g/dL (ref 3.5–5.2)
Alkaline Phosphatase: 34 U/L — ABNORMAL LOW (ref 39–117)
BUN: 35 mg/dL — ABNORMAL HIGH (ref 6–23)
CO2: 28 mEq/L (ref 19–32)
Calcium: 10.2 mg/dL (ref 8.4–10.5)
Chloride: 104 mEq/L (ref 96–112)
Creat: 1.16 mg/dL (ref 0.50–1.35)
GFR, Est African American: 79 mL/min
GFR, Est Non African American: 68 mL/min
Glucose, Bld: 142 mg/dL — ABNORMAL HIGH (ref 70–99)
Potassium: 4.9 mEq/L (ref 3.5–5.3)
Sodium: 140 mEq/L (ref 135–145)
Total Bilirubin: 0.4 mg/dL (ref 0.2–1.2)
Total Protein: 7.2 g/dL (ref 6.0–8.3)

## 2014-03-25 LAB — CBC WITH DIFFERENTIAL/PLATELET
Basophils Absolute: 0 10*3/uL (ref 0.0–0.1)
Basophils Relative: 0 % (ref 0–1)
Eosinophils Absolute: 0.2 10*3/uL (ref 0.0–0.7)
Eosinophils Relative: 3 % (ref 0–5)
HCT: 41.1 % (ref 39.0–52.0)
Hemoglobin: 14.1 g/dL (ref 13.0–17.0)
Lymphocytes Relative: 34 % (ref 12–46)
Lymphs Abs: 2.3 10*3/uL (ref 0.7–4.0)
MCH: 29.3 pg (ref 26.0–34.0)
MCHC: 34.3 g/dL (ref 30.0–36.0)
MCV: 85.3 fL (ref 78.0–100.0)
Monocytes Absolute: 0.7 10*3/uL (ref 0.1–1.0)
Monocytes Relative: 10 % (ref 3–12)
Neutro Abs: 3.6 10*3/uL (ref 1.7–7.7)
Neutrophils Relative %: 53 % (ref 43–77)
Platelets: 284 10*3/uL (ref 150–400)
RBC: 4.82 MIL/uL (ref 4.22–5.81)
RDW: 14.3 % (ref 11.5–15.5)
WBC: 6.7 10*3/uL (ref 4.0–10.5)

## 2014-03-25 LAB — LIPID PANEL
Cholesterol: 153 mg/dL (ref 0–200)
HDL: 27 mg/dL — ABNORMAL LOW (ref >39)
LDL Cholesterol: 63 mg/dL (ref 0–99)
Total CHOL/HDL Ratio: 5.7 Ratio
Triglycerides: 313 mg/dL — ABNORMAL HIGH (ref <150)
VLDL: 63 mg/dL — ABNORMAL HIGH (ref 0–40)

## 2014-04-01 ENCOUNTER — Encounter: Payer: Self-pay | Admitting: Family Medicine

## 2014-04-01 ENCOUNTER — Ambulatory Visit (INDEPENDENT_AMBULATORY_CARE_PROVIDER_SITE_OTHER): Payer: BC Managed Care – PPO | Admitting: Family Medicine

## 2014-04-01 VITALS — BP 130/69 | HR 86 | Resp 16 | Ht 73.0 in | Wt 206.0 lb

## 2014-04-01 DIAGNOSIS — I10 Essential (primary) hypertension: Secondary | ICD-10-CM

## 2014-04-01 DIAGNOSIS — E781 Pure hyperglyceridemia: Secondary | ICD-10-CM

## 2014-04-01 DIAGNOSIS — IMO0001 Reserved for inherently not codable concepts without codable children: Secondary | ICD-10-CM

## 2014-04-01 DIAGNOSIS — E785 Hyperlipidemia, unspecified: Secondary | ICD-10-CM

## 2014-04-01 DIAGNOSIS — E1165 Type 2 diabetes mellitus with hyperglycemia: Secondary | ICD-10-CM

## 2014-04-01 MED ORDER — GLIPIZIDE ER 10 MG PO TB24: 10.0000 mg | ORAL_TABLET | Freq: Every day | ORAL | Status: DC

## 2014-04-01 MED ORDER — OMEGA-3-ACID ETHYL ESTERS 1 G PO CAPS: 2.0000 g | ORAL_CAPSULE | Freq: Two times a day (BID) | ORAL | Status: DC

## 2014-04-01 MED ORDER — CRESTOR 40 MG PO TABS: 40.0000 mg | ORAL_TABLET | Freq: Every day | ORAL | Status: AC

## 2014-04-01 MED ORDER — CANAGLIFLOZIN 300 MG PO TABS: 300.0000 | ORAL_TABLET | Freq: Every day | ORAL | Status: AC

## 2014-04-01 NOTE — Patient Instructions (Signed)
1)  Cholesterol -   We are changing you from atorvastatin to Crestor 20 mg so take 1/2 of the 40 mg.  You will remain on the fenofibrate along with the Lovaza.   If you do this and follow Dr. Rebekah ChesterfieldFuhrman's plan, you should get your triglycerides back to how they were after you did the HCG diet.   2)  Blood Sugar - Your FBS was 40 points lower so I anticipate that your A1c will also be better. We'll recheck it in 3 months.      Triglycerides, TG, TRIG This is a test to check your risk of developing heart disease. It is often done as part of a lipid profile during a regular medical exam or if you are being treated for high triglycerides. This test measures the amount of triglycerides in your blood. Triglycerides are the body's storage form for fat. Most triglycerides are found in fat tissue. Some triglycerides circulate in the blood to provide fuel for muscles to work. Extra triglycerides are found in the blood after eating a meal when fat is being sent from the gut to fat tissue for storage. The test for triglycerides should be done when you are fasting and no extra triglycerides from a recent meal are present.  SAMPLE COLLECTION The test for triglycerides uses a blood sample. Most often, the blood sample is collected using a needle to collect blood from a vein. Sometimes triglycerides are measured using a drop of blood collected by puncturing the skin on a finger. Testing should be done when you are fasting. For 12 to 14 hours before the test, only water is permitted. In addition, alcohol should not be consumed for the 24 hours just before the test. If you are diabetic and your blood sugar is out of control, triglycerides will be very high. NORMAL FINDINGS  Adult/elderly  Male: 40-160 mg/dL or 4.09-8.110.45-1.81 mmol/L (SI units)  Male: 35-135 mg/dL or 9.14-7.820.40-1.52 mmol/L (SI units)  0-61 years  Male: 30-86 mg/dL  Male: 95-6232-99 mg/dL  1-306-61 years  Male: 86-57831-108 mg/dL  Male: 46-96235-114 mg/dL  95-2812-61  years  Male: 41-32436-138 mg/dL  Male: 40-10241-138 mg/dL  72-5316-61 years  Male: 66-44040-163 mg/dL  Male: 34-74240-128 mg/dL Ranges for normal findings may vary among different laboratories and hospitals. You should always check with your doctor after having lab work or other tests done to discuss the meaning of your test results and whether your values are considered within normal limits. MEANING OF TEST  Your caregiver will go over the test results with you and discuss the importance and meaning of your results, as well as treatment options and the need for additional tests if necessary. OBTAINING THE TEST RESULTS It is your responsibility to obtain your test results. Ask the lab or department performing the test when and how you will get your results. Document Released: 10/09/2004 Document Revised: 11/29/2011 Document Reviewed: 08/18/2008 [facility] Patient Information 2015 [place]ExitCare, [place]. This information is not intended to replace advice given to you by your health care provider. Make sure you discuss any questions you have with your health care provider.

## 2014-04-01 NOTE — Progress Notes (Signed)
Subjective:    Patient ID: Nicholas [name], male    DOB: 02-Jul-1951, 63 y.o.   MRN: [id number]  HPI   Nicholas [name] is here today to go over his recent lab results. He is needing to get refills. We are going over the following:  1)  Hypertension:  He is doing well on the lisinopril.  2)  Hyperlipidemia:  He is doing well on the Lovaza, fenobirate and Lipitor.   3)  Type II DM:  He is is taking Invokana, Actos, Glipizide and Janumet. He admits that he does not keep check of his sugars at home.    Review of Systems  Constitutional: Negative for activity change, appetite change and fatigue.  Cardiovascular: Negative for chest pain, palpitations and leg swelling.  Endocrine: Negative for polydipsia, polyphagia and polyuria.  Psychiatric/Behavioral: Negative for behavioral problems. The patient is not nervous/anxious.   All other systems reviewed and are negative.    Past Medical History  Diagnosis Date  . Diabetes mellitus without complication     Eye exam - 2011  . Migraine headache   . Hyperlipidemia   . Hypertension   . Deviated septum   . Hepatitis A   . Colon polyp     Dr. Vernell Barrier, it was recommended that he have a follow-up colonoscopy in 2010.  Marland Kitchen BPH (benign prostatic hypertrophy) 12/20/2011    Dr. Cleatrice Burke  . HSV-1 (herpes simplex virus 1) infection      History reviewed. No pertinent past surgical history.   History   Social History Narrative   Marital Status:  Married Financial planner)    Children:  Daughter (Meta) Sons Burman Riis, Latimer)   Pets:  Dogs (2)    Living Situation: Lives with spouse   Occupation: Psychologist, sport and exercise   Education:  Engineer, maintenance (IT)   Tobacco Use/Exposure:  None    Alcohol Use:  Occasional   Drug Use:  None   Diet:  Regular   Exercise:  None   Hobbies:  Computers     Family History  Problem Relation Age of Onset  . Diabetes Mother   . Neuropathy Mother   . Diabetes Father   . Cancer Father     Lung Cancer  . Diabetes Sister   .  Diabetes Brother      Current Outpatient Prescriptions on File Prior to Visit  Medication Sig Dispense Refill  . aspirin (ASPIRIN ADULT LOW STRENGTH) 81 MG EC tablet Take 81 mg by mouth daily. Swallow whole.      Marland Kitchen atorvastatin (LIPITOR) 80 MG tablet Take 1 tablet (80 mg total) by mouth daily.  90 tablet  1  . famciclovir (FAMVIR) 500 MG tablet Take 1 tab daily for prevention or 3 tabs at onset of fever blister  90 tablet  1  . fenofibrate 160 MG tablet Take 1 tablet (160 mg total) by mouth daily.  90 tablet  3  . lisinopril (PRINIVIL,ZESTRIL) 20 MG tablet Take 1 tablet (20 mg total) by mouth daily.  90 tablet  3  . pioglitazone (ACTOS) 45 MG tablet Take 1 tablet (45 mg total) by mouth daily.  90 tablet  1  . sitaGLIPtin-metformin (JANUMET) 50-1000 MG per tablet Take 1 tablet by mouth 2 (two) times daily with a meal.  180 tablet  1   No current facility-administered medications on file prior to visit.     No Known Allergies   Immunization History  Administered Date(s) Administered  . Influenza Whole 10/01/2009, 07/09/2011, 06/27/2012  .  Influenza,inj,Quad PF,36+ Mos 07/17/2013  . Td 09/24/2005  . Tdap 06/27/2012       Objective:   Physical Exam  Vitals reviewed. Constitutional: He is oriented to person, place, and time. He appears well-nourished.  HENT:  Head: Normocephalic.  Neck: Normal range of motion.  Pulmonary/Chest: Effort normal.  Neurological: He is alert and oriented to person, place, and time.  Skin: Skin is warm and dry.  Psychiatric: He has a normal mood and affect. His behavior is normal. Judgment and thought content normal.      Assessment & Plan:    Nicholas [name] was seen today for medication management.  Diagnoses and associated orders for this visit:  Essential hypertension, benign  Other and unspecified hyperlipidemia - CRESTOR 40 MG tablet; Take 1 tablet (40 mg total) by mouth daily.  Type II or unspecified type diabetes mellitus without mention of  complication, uncontrolled - glipiZIDE (GLUCOTROL XL) 10 MG 24 hr tablet; Take 1 tablet (10 mg total) by mouth daily with breakfast. - Canagliflozin (INVOKANA) 300 MG TABS; Take 300 tablets (90,000 mg total) by mouth daily.  High triglycerides - omega-3 acid ethyl esters (LOVAZA) 1 G capsule; Take 2 capsules (2 g total) by mouth 2 (two) times daily.   TIME SPENT "FACE TO FACE" WITH PATIENT -  30 MINS

## 2015-03-27 ENCOUNTER — Telehealth: Payer: Self-pay | Admitting: Family Medicine

## 2015-03-27 NOTE — Telephone Encounter (Signed)
[facility] 2 pages to Dr. Alberteen SamZanard

## 2017-05-19 ENCOUNTER — Encounter (HOSPITAL_COMMUNITY): Payer: Self-pay | Admitting: Emergency Medicine

## 2017-05-19 ENCOUNTER — Emergency Department (HOSPITAL_COMMUNITY)
Admission: EM | Admit: 2017-05-19 | Discharge: 2017-05-19 | Disposition: A | Discharge status: Home / Self Care | Payer: BLUE CROSS/BLUE SHIELD | Attending: Emergency Medicine | Admitting: Emergency Medicine

## 2017-05-19 DIAGNOSIS — E119 Type 2 diabetes mellitus without complications: Secondary | ICD-10-CM | POA: Diagnosis not present

## 2017-05-19 DIAGNOSIS — I10 Essential (primary) hypertension: Secondary | ICD-10-CM | POA: Insufficient documentation

## 2017-05-19 DIAGNOSIS — M79604 Pain in right leg: Secondary | ICD-10-CM | POA: Diagnosis present

## 2017-05-19 DIAGNOSIS — Z7982 Long term (current) use of aspirin: Secondary | ICD-10-CM | POA: Insufficient documentation

## 2017-05-19 LAB — D-DIMER, QUANTITATIVE (NOT AT ARMC)

## 2017-05-19 NOTE — ED Provider Notes (Signed)
[facility]-EMERGENCY DEPT Provider Note   CSN: [id number] Arrival date & time: 05/19/17  1606     History   Chief Complaint Chief Complaint  Patient presents with  . Leg Pain    right    HPI Nicholas [name] is a 66 y.o. male with DM, Hepatitis A, HTN who presents to the ED with right leg pain. The pain started 2 days ago. The pain started suddenly while he was sitting. Patient reports he took ASA and it seemed to help a little. Today he was taking the steps at work and had a sharp shooting pain in the back of the knee. Patient denies hx of DVT. 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 He recently had a coworker pass away due to a blood clot which make the patient worry. He denies any recent car trips or plane trips. Patient denies chest pain or shortness of breath.  HPI  Past Medical History:  Diagnosis Date  . BPH (benign prostatic hypertrophy) 12/20/2011   Dr. Cleatrice Burke  . Colon polyp    Dr. Vernell Barrier, it was recommended that he have a follow-up colonoscopy in 2010.  Marland Kitchen Deviated septum   . Diabetes mellitus without complication [facility])    Eye exam - 2011  . Hepatitis A   . HSV-1 (herpes simplex virus 1) infection   . Hyperlipidemia   . Hypertension   . Migraine headache     Patient Active Problem List   Diagnosis Date Noted  . Other and unspecified hyperlipidemia 07/17/2013  . Need for prophylactic vaccination and inoculation against influenza 07/17/2013  . Type II or unspecified type diabetes mellitus without mention of complication, not stated as uncontrolled 04/12/2013  . 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 Elevated transaminase measurement 04/12/2013  . Type II or unspecified type diabetes mellitus without mention of complication, uncontrolled 01/05/2013  . Essential hypertension, benign 01/05/2013  . BPH (benign prostatic hyperplasia) 01/05/2013  . Overweight(278.02) 01/05/2013    Past Surgical History:  Procedure Laterality Date  . NASAL SEPTUM SURGERY          Home Medications    Prior to Admission medications   Medication Sig Start Date End Date Taking? Authorizing Provider  aspirin (ASPIRIN ADULT LOW STRENGTH) 81 MG EC tablet Take 81 mg by mouth daily. Swallow whole.    [provider]  atorvastatin (LIPITOR) 80 MG tablet Take 1 tablet (80 mg total) by mouth daily. 07/17/13 07/17/14  Zanard, Hinton Dyer, MD  CRESTOR 40 MG tablet Take 1 tablet (40 mg total) by mouth daily. 04/01/14 04/02/15  Gillian Scarce, MD  fenofibrate 160 MG tablet Take 1 tablet (160 mg total) by mouth daily. 01/22/14 02/11/15  Gillian Scarce, MD  glipiZIDE (GLUCOTROL XL) 10 MG 24 hr tablet Take 1 tablet (10 mg total) by mouth daily with breakfast. 04/01/14 04/01/15  Zanard, Hinton Dyer, MD  lisinopril (PRINIVIL,ZESTRIL) 20 MG tablet Take 1 tablet (20 mg total) by mouth daily. 01/22/14 02/11/15  Gillian Scarce, MD  omega-3 acid ethyl esters (LOVAZA) 1 G capsule Take 2 capsules (2 g total) by mouth 2 (two) times daily. 04/01/14 04/01/15  Gillian Scarce, MD  pioglitazone (ACTOS) 45 MG tablet Take 1 tablet (45 mg total) by mouth daily. 01/22/14 02/11/15  Gillian Scarce, MD  sitaGLIPtin-metformin (JANUMET) 50-1000 MG per tablet Take 1 tablet by mouth 2 (two) times daily with a meal. 01/22/14 01/22/15  Zanard, Hinton Dyer, MD    Family History  Family History  Problem Relation Age of Onset  . Diabetes Mother   . Neuropathy Mother   . Diabetes Father   . Cancer Father        Lung Cancer  . Diabetes Sister   . Diabetes Brother     Social History Social History  Substance Use Topics  . Smoking status: Never Smoker  . Smokeless tobacco: Never Used  . Alcohol use Yes     Comment: He says that he only drinks occasionally.       Allergies   Patient has no known allergies.   Review of Systems Review of Systems  Constitutional: Negative for chills and fever.  HENT: Negative.   Eyes: Negative for redness, itching and visual disturbance.  Respiratory: Negative for cough,  shortness of breath and wheezing.   Cardiovascular: Negative for chest pain and palpitations. Leg swelling: right leg.  Gastrointestinal: Negative for abdominal pain, nausea and vomiting.  Genitourinary: Negative for dysuria, flank pain, frequency and urgency.  Musculoskeletal: Positive for joint swelling (right knee, ankle). Negative for back pain.  Skin: Negative for rash and wound.  Neurological: Negative for dizziness and headaches.  Hematological: Does not bruise/bleed easily.  Psychiatric/Behavioral: Negative for confusion. The patient is not nervous/anxious.      Physical Exam Updated Vital Signs BP (!) 157/80 (BP Location: Right Arm)   Pulse 83   Temp 97.7 F (36.5 C) (Oral)   Resp 20   Ht 6' (1.829 m)   Wt 83.9 kg (185 lb)   SpO2 95%   BMI 25.09 kg/m   Physical Exam  Constitutional: He is oriented to person, place, and time. He appears well-developed and well-nourished. No distress.  HENT:  Head: Normocephalic and atraumatic.  Eyes: EOM are normal.  Neck: Neck supple.  Cardiovascular: Normal rate and regular rhythm.   Pulmonary/Chest: Effort normal and breath sounds normal.  Abdominal: Soft. There is no tenderness.  Musculoskeletal:       Right knee: He exhibits normal range of motion, no effusion, no ecchymosis, no deformity, no laceration and no erythema. Swelling: mild. Tenderness found.       Legs: Tenderness to the posterior aspect of the right knee, no calf tenderness, compartments soft. Pedal pulses 2+, adequate circulation. Slight swelling of the right ankle.   Neurological: He is alert and oriented to person, place, and time. No cranial nerve deficit.  Skin: Skin is warm and dry.  Psychiatric: He has a normal mood and affect.  Nursing note and vitals reviewed.    ED Treatments / Results  Labs (all labs ordered are listed, but only abnormal results are displayed) Labs Reviewed  D-DIM[facility], QUANTITATIVE ([facility])    Radiology No results  found.  Procedures Procedures (including critical care time)  Medications Ordered in ED Medications - No data to display   Initial Impression / Assessment and Plan / ED Course  I have reviewed the triage vital signs and the nursing notes.  66 y.o. male with right leg with normal d-dimer and appear safe for d/c. Knee brace applied to right knee, discussed lab results and plan of care. Encouraged patient to f/u with PCP.   Final Clinical Impressions(s) / ED Diagnoses   Final diagnoses:  Right leg pain    New Prescriptions New Prescriptions   No medications on file     Janne Napoleoneese, Hope M, NP 05/19/17 2124    Shaune PollackIsaacs, Cameron, MD 05/20/17 [phone]

## 2017-05-19 NOTE — ED Notes (Signed)
Dr. Issacs at bedside 

## 2017-05-19 NOTE — Discharge Instructions (Signed)
The blood test we did tonight to check for possible blood clot is negative. Wear the brace for support and comfort and take tylenol and ibuprofen for pain. Follow up with Dr. Alberteen SamZanard.  Return here as needed.

## 2017-05-19 NOTE — ED Triage Notes (Signed)
Patient c/o right leg pain x 2 days.  Patient worse with weight bearing but intermittent.  Patient denies swelling.

## 2020-01-14 ENCOUNTER — Encounter (HOSPITAL_BASED_OUTPATIENT_CLINIC_OR_DEPARTMENT_OTHER): Payer: Self-pay | Admitting: Emergency Medicine

## 2020-01-14 ENCOUNTER — Emergency Department (HOSPITAL_BASED_OUTPATIENT_CLINIC_OR_DEPARTMENT_OTHER): Payer: BC Managed Care – PPO

## 2020-01-14 ENCOUNTER — Other Ambulatory Visit: Payer: Self-pay

## 2020-01-14 ENCOUNTER — Inpatient Hospital Stay (HOSPITAL_BASED_OUTPATIENT_CLINIC_OR_DEPARTMENT_OTHER)
Admission: EM | Admit: 2020-01-14 | Discharge: 2020-02-02 | DRG: 023 | Disposition: A | Discharge status: Rehabilitation Facility | Payer: BC Managed Care – PPO | Attending: Neurosurgery | Admitting: Neurosurgery

## 2020-01-14 DIAGNOSIS — M791 Myalgia, unspecified site: Secondary | ICD-10-CM | POA: Diagnosis not present

## 2020-01-14 DIAGNOSIS — I729 Aneurysm of unspecified site: Secondary | ICD-10-CM

## 2020-01-14 DIAGNOSIS — R29810 Facial weakness: Secondary | ICD-10-CM | POA: Diagnosis not present

## 2020-01-14 DIAGNOSIS — R0989 Other specified symptoms and signs involving the circulatory and respiratory systems: Secondary | ICD-10-CM | POA: Diagnosis not present

## 2020-01-14 DIAGNOSIS — S065X9A Traumatic subdural hemorrhage with loss of consciousness of unspecified duration, initial encounter: Secondary | ICD-10-CM | POA: Diagnosis not present

## 2020-01-14 DIAGNOSIS — J90 Pleural effusion, not elsewhere classified: Secondary | ICD-10-CM | POA: Diagnosis not present

## 2020-01-14 DIAGNOSIS — I69190 Apraxia following nontraumatic intracerebral hemorrhage: Secondary | ICD-10-CM | POA: Diagnosis not present

## 2020-01-14 DIAGNOSIS — Z23 Encounter for immunization: Secondary | ICD-10-CM

## 2020-01-14 DIAGNOSIS — R471 Dysarthria and anarthria: Secondary | ICD-10-CM | POA: Diagnosis present

## 2020-01-14 DIAGNOSIS — R140 Abdominal distension (gaseous): Secondary | ICD-10-CM

## 2020-01-14 DIAGNOSIS — I4729 Other ventricular tachycardia: Secondary | ICD-10-CM

## 2020-01-14 DIAGNOSIS — E876 Hypokalemia: Secondary | ICD-10-CM | POA: Diagnosis not present

## 2020-01-14 DIAGNOSIS — E871 Hypo-osmolality and hyponatremia: Secondary | ICD-10-CM | POA: Diagnosis not present

## 2020-01-14 DIAGNOSIS — I10 Essential (primary) hypertension: Secondary | ICD-10-CM | POA: Diagnosis present

## 2020-01-14 DIAGNOSIS — M62838 Other muscle spasm: Secondary | ICD-10-CM | POA: Diagnosis not present

## 2020-01-14 DIAGNOSIS — Z7984 Long term (current) use of oral hypoglycemic drugs: Secondary | ICD-10-CM

## 2020-01-14 DIAGNOSIS — D696 Thrombocytopenia, unspecified: Secondary | ICD-10-CM | POA: Diagnosis not present

## 2020-01-14 DIAGNOSIS — Z66 Do not resuscitate: Secondary | ICD-10-CM | POA: Diagnosis not present

## 2020-01-14 DIAGNOSIS — J9601 Acute respiratory failure with hypoxia: Secondary | ICD-10-CM | POA: Diagnosis not present

## 2020-01-14 DIAGNOSIS — K759 Inflammatory liver disease, unspecified: Secondary | ICD-10-CM | POA: Diagnosis not present

## 2020-01-14 DIAGNOSIS — E1165 Type 2 diabetes mellitus with hyperglycemia: Secondary | ICD-10-CM | POA: Diagnosis present

## 2020-01-14 DIAGNOSIS — R0902 Hypoxemia: Secondary | ICD-10-CM

## 2020-01-14 DIAGNOSIS — E785 Hyperlipidemia, unspecified: Secondary | ICD-10-CM | POA: Diagnosis present

## 2020-01-14 DIAGNOSIS — E8770 Fluid overload, unspecified: Secondary | ICD-10-CM | POA: Diagnosis not present

## 2020-01-14 DIAGNOSIS — G8191 Hemiplegia, unspecified affecting right dominant side: Secondary | ICD-10-CM | POA: Diagnosis not present

## 2020-01-14 DIAGNOSIS — B159 Hepatitis A without hepatic coma: Secondary | ICD-10-CM | POA: Diagnosis present

## 2020-01-14 DIAGNOSIS — I169 Hypertensive crisis, unspecified: Secondary | ICD-10-CM | POA: Diagnosis present

## 2020-01-14 DIAGNOSIS — E1159 Type 2 diabetes mellitus with other circulatory complications: Secondary | ICD-10-CM | POA: Diagnosis not present

## 2020-01-14 DIAGNOSIS — Z833 Family history of diabetes mellitus: Secondary | ICD-10-CM

## 2020-01-14 DIAGNOSIS — E559 Vitamin D deficiency, unspecified: Secondary | ICD-10-CM | POA: Diagnosis present

## 2020-01-14 DIAGNOSIS — I609 Nontraumatic subarachnoid hemorrhage, unspecified: Principal | ICD-10-CM

## 2020-01-14 DIAGNOSIS — J9690 Respiratory failure, unspecified, unspecified whether with hypoxia or hypercapnia: Secondary | ICD-10-CM

## 2020-01-14 DIAGNOSIS — I472 Ventricular tachycardia: Secondary | ICD-10-CM | POA: Diagnosis not present

## 2020-01-14 DIAGNOSIS — D62 Acute posthemorrhagic anemia: Secondary | ICD-10-CM | POA: Diagnosis present

## 2020-01-14 DIAGNOSIS — I454 Nonspecific intraventricular block: Secondary | ICD-10-CM

## 2020-01-14 DIAGNOSIS — I67848 Other cerebrovascular vasospasm and vasoconstriction: Secondary | ICD-10-CM | POA: Diagnosis present

## 2020-01-14 DIAGNOSIS — I77810 Thoracic aortic ectasia: Secondary | ICD-10-CM | POA: Diagnosis present

## 2020-01-14 DIAGNOSIS — Z801 Family history of malignant neoplasm of trachea, bronchus and lung: Secondary | ICD-10-CM

## 2020-01-14 DIAGNOSIS — R000 Tachycardia, unspecified: Secondary | ICD-10-CM | POA: Diagnosis not present

## 2020-01-14 DIAGNOSIS — E860 Dehydration: Secondary | ICD-10-CM | POA: Diagnosis present

## 2020-01-14 DIAGNOSIS — N400 Enlarged prostate without lower urinary tract symptoms: Secondary | ICD-10-CM | POA: Diagnosis present

## 2020-01-14 DIAGNOSIS — M25561 Pain in right knee: Secondary | ICD-10-CM | POA: Diagnosis not present

## 2020-01-14 DIAGNOSIS — R451 Restlessness and agitation: Secondary | ICD-10-CM | POA: Diagnosis not present

## 2020-01-14 DIAGNOSIS — J9811 Atelectasis: Secondary | ICD-10-CM | POA: Diagnosis not present

## 2020-01-14 DIAGNOSIS — Z20822 Contact with and (suspected) exposure to covid-19: Secondary | ICD-10-CM | POA: Diagnosis present

## 2020-01-14 DIAGNOSIS — I639 Cerebral infarction, unspecified: Secondary | ICD-10-CM | POA: Diagnosis not present

## 2020-01-14 DIAGNOSIS — R7309 Other abnormal glucose: Secondary | ICD-10-CM | POA: Diagnosis not present

## 2020-01-14 DIAGNOSIS — R29717 NIHSS score 17: Secondary | ICD-10-CM | POA: Diagnosis not present

## 2020-01-14 DIAGNOSIS — Z452 Encounter for adjustment and management of vascular access device: Secondary | ICD-10-CM | POA: Diagnosis not present

## 2020-01-14 DIAGNOSIS — Z794 Long term (current) use of insulin: Secondary | ICD-10-CM | POA: Diagnosis not present

## 2020-01-14 DIAGNOSIS — B009 Herpesviral infection, unspecified: Secondary | ICD-10-CM | POA: Diagnosis present

## 2020-01-14 DIAGNOSIS — I69292 Facial weakness following other nontraumatic intracranial hemorrhage: Secondary | ICD-10-CM | POA: Diagnosis not present

## 2020-01-14 DIAGNOSIS — Z298 Encounter for other specified prophylactic measures: Secondary | ICD-10-CM | POA: Diagnosis not present

## 2020-01-14 DIAGNOSIS — I4510 Unspecified right bundle-branch block: Secondary | ICD-10-CM | POA: Diagnosis present

## 2020-01-14 DIAGNOSIS — I951 Orthostatic hypotension: Secondary | ICD-10-CM | POA: Diagnosis not present

## 2020-01-14 DIAGNOSIS — Z9289 Personal history of other medical treatment: Secondary | ICD-10-CM

## 2020-01-14 DIAGNOSIS — R509 Fever, unspecified: Secondary | ICD-10-CM

## 2020-01-14 DIAGNOSIS — G8194 Hemiplegia, unspecified affecting left nondominant side: Secondary | ICD-10-CM | POA: Diagnosis not present

## 2020-01-14 DIAGNOSIS — R011 Cardiac murmur, unspecified: Secondary | ICD-10-CM | POA: Diagnosis not present

## 2020-01-14 DIAGNOSIS — Z7189 Other specified counseling: Secondary | ICD-10-CM | POA: Diagnosis not present

## 2020-01-14 DIAGNOSIS — T82534A Leakage of infusion catheter, initial encounter: Secondary | ICD-10-CM

## 2020-01-14 DIAGNOSIS — E782 Mixed hyperlipidemia: Secondary | ICD-10-CM | POA: Diagnosis not present

## 2020-01-14 DIAGNOSIS — G43909 Migraine, unspecified, not intractable, without status migrainosus: Secondary | ICD-10-CM | POA: Diagnosis present

## 2020-01-14 DIAGNOSIS — I63231 Cerebral infarction due to unspecified occlusion or stenosis of right carotid arteries: Secondary | ICD-10-CM | POA: Diagnosis not present

## 2020-01-14 DIAGNOSIS — R7401 Elevation of levels of liver transaminase levels: Secondary | ICD-10-CM | POA: Diagnosis not present

## 2020-01-14 DIAGNOSIS — I69254 Hemiplegia and hemiparesis following other nontraumatic intracranial hemorrhage affecting left non-dominant side: Secondary | ICD-10-CM | POA: Diagnosis present

## 2020-01-14 DIAGNOSIS — Z7982 Long term (current) use of aspirin: Secondary | ICD-10-CM

## 2020-01-14 DIAGNOSIS — E7800 Pure hypercholesterolemia, unspecified: Secondary | ICD-10-CM | POA: Diagnosis not present

## 2020-01-14 DIAGNOSIS — I63513 Cerebral infarction due to unspecified occlusion or stenosis of bilateral middle cerebral arteries: Secondary | ICD-10-CM | POA: Diagnosis not present

## 2020-01-14 DIAGNOSIS — G8110 Spastic hemiplegia affecting unspecified side: Secondary | ICD-10-CM | POA: Diagnosis not present

## 2020-01-14 DIAGNOSIS — I63511 Cerebral infarction due to unspecified occlusion or stenosis of right middle cerebral artery: Secondary | ICD-10-CM | POA: Diagnosis not present

## 2020-01-14 DIAGNOSIS — D72829 Elevated white blood cell count, unspecified: Secondary | ICD-10-CM | POA: Diagnosis not present

## 2020-01-14 LAB — COMPREHENSIVE METABOLIC PANEL
ALT: 41 U/L (ref 0–44)
AST: 28 U/L (ref 15–41)
Albumin: 5.2 g/dL — ABNORMAL HIGH (ref 3.5–5.0)
Alkaline Phosphatase: 44 U/L (ref 38–126)
Anion gap: 18 — ABNORMAL HIGH (ref 5–15)
BUN: 27 mg/dL — ABNORMAL HIGH (ref 8–23)
CO2: 20 mmol/L — ABNORMAL LOW (ref 22–32)
Calcium: 9.7 mg/dL (ref 8.9–10.3)
Chloride: 101 mmol/L (ref 98–111)
Creatinine, Ser: 1.13 mg/dL (ref 0.61–1.24)
GFR calc Af Amer: >60 mL/min (ref >60)
GFR calc non Af Amer: >60 mL/min (ref >60)
Glucose, Bld: 343 mg/dL — ABNORMAL HIGH (ref 70–99)
Potassium: 3.9 mmol/L (ref 3.5–5.1)
Sodium: 139 mmol/L (ref 135–145)
Total Bilirubin: 1.3 mg/dL — ABNORMAL HIGH (ref 0.3–1.2)
Total Protein: 8.8 g/dL — ABNORMAL HIGH (ref 6.5–8.1)

## 2020-01-14 LAB — CBC WITH DIFFERENTIAL/PLATELET
Abs Immature Granulocytes: 0.07 10*3/uL (ref 0.00–0.07)
Basophils Absolute: 0 10*3/uL (ref 0.0–0.1)
Basophils Relative: 0 %
Eosinophils Absolute: 0 10*3/uL (ref 0.0–0.5)
Eosinophils Relative: 0 %
HCT: 46.7 % (ref 39.0–52.0)
Hemoglobin: 15.9 g/dL (ref 13.0–17.0)
Immature Granulocytes: 1 %
Lymphocytes Relative: 2 %
Lymphs Abs: 0.4 10*3/uL — ABNORMAL LOW (ref 0.7–4.0)
MCH: 29.1 pg (ref 26.0–34.0)
MCHC: 34 g/dL (ref 30.0–36.0)
MCV: 85.4 fL (ref 80.0–100.0)
Monocytes Absolute: 0.5 10*3/uL (ref 0.1–1.0)
Monocytes Relative: 3 %
Neutro Abs: 13.7 10*3/uL — ABNORMAL HIGH (ref 1.7–7.7)
Neutrophils Relative %: 94 %
Platelets: 232 10*3/uL (ref 150–400)
RBC: 5.47 MIL/uL (ref 4.22–5.81)
RDW: 12.8 % (ref 11.5–15.5)
WBC: 14.6 10*3/uL — ABNORMAL HIGH (ref 4.0–10.5)
nRBC: 0 % (ref 0.0–0.2)

## 2020-01-14 LAB — POCT I-STAT EG7
Acid-Base Excess: 0 mmol/L (ref 0.0–2.0)
Bicarbonate: 22.9 mmol/L (ref 20.0–28.0)
Calcium, Ion: 1.21 mmol/L (ref 1.15–1.40)
HCT: 49 % (ref 39.0–52.0)
Hemoglobin: 16.7 g/dL (ref 13.0–17.0)
O2 Saturation: 90 %
Potassium: 3.9 mmol/L (ref 3.5–5.1)
Sodium: 141 mmol/L (ref 135–145)
TCO2: 24 mmol/L (ref 22–32)
pCO2, Ven: 33.5 mmHg — ABNORMAL LOW (ref 44.0–60.0)
pH, Ven: 7.442 — ABNORMAL HIGH (ref 7.250–7.430)
pO2, Ven: 55 mmHg — ABNORMAL HIGH (ref 32.0–45.0)

## 2020-01-14 LAB — LACTIC ACID, PLASMA
Lactic Acid, Venous: 3.2 mmol/L (ref 0.5–1.9)
Lactic Acid, Venous: 2.7 mmol/L (ref 0.5–1.9)

## 2020-01-14 LAB — URINALYSIS, COMPLETE (UACMP) WITH MICROSCOPIC
Bilirubin Urine: NEGATIVE
Glucose, UA: >=500 mg/dL — AB
Ketones, ur: 15 mg/dL — AB
Leukocytes,Ua: NEGATIVE
Nitrite: NEGATIVE
Protein, ur: 100 mg/dL — AB
Specific Gravity, Urine: 1.015 (ref 1.005–1.030)
pH: 6 (ref 5.0–8.0)

## 2020-01-14 LAB — MRSA PCR SCREENING: MRSA by PCR: NEGATIVE

## 2020-01-14 LAB — HEMOGLOBIN A1C
Hgb A1c MFr Bld: 7.7 % — ABNORMAL HIGH (ref 4.8–5.6)
Mean Plasma Glucose: 174.29 mg/dL

## 2020-01-14 LAB — PROTIME-INR
INR: 1.1 (ref 0.8–1.2)
Prothrombin Time: 14.1 seconds (ref 11.4–15.2)

## 2020-01-14 LAB — GLUCOSE, CAPILLARY
Glucose-Capillary: 369 mg/dL — ABNORMAL HIGH (ref 70–99)
Glucose-Capillary: 419 mg/dL — ABNORMAL HIGH (ref 70–99)
Glucose-Capillary: 302 mg/dL — ABNORMAL HIGH (ref 70–99)

## 2020-01-14 LAB — RESPIRATORY PANEL BY RT PCR (FLU A&B, COVID)
Influenza A by PCR: NEGATIVE
Influenza B by PCR: NEGATIVE
SARS Coronavirus 2 by RT PCR: NEGATIVE

## 2020-01-14 LAB — APTT: aPTT: 27 seconds (ref 24–36)

## 2020-01-14 LAB — HIV ANTIBODY (ROUTINE TESTING W REFLEX): HIV Screen 4th Generation wRfx: NONREACTIVE

## 2020-01-14 LAB — CBG MONITORING, ED: Glucose-Capillary: 324 mg/dL — ABNORMAL HIGH (ref 70–99)

## 2020-01-14 LAB — AMMONIA: Ammonia: 17 umol/L (ref 9–35)

## 2020-01-14 IMAGING — DX DG CHEST 1V PORT
1 series · 1 of 1 positions shown · non-contrast
Comparison: None.

CLINICAL DATA: Altered mental status.  Headache.

EXAM:
PORTABLE CHEST 1 VIEW

[chest ap]
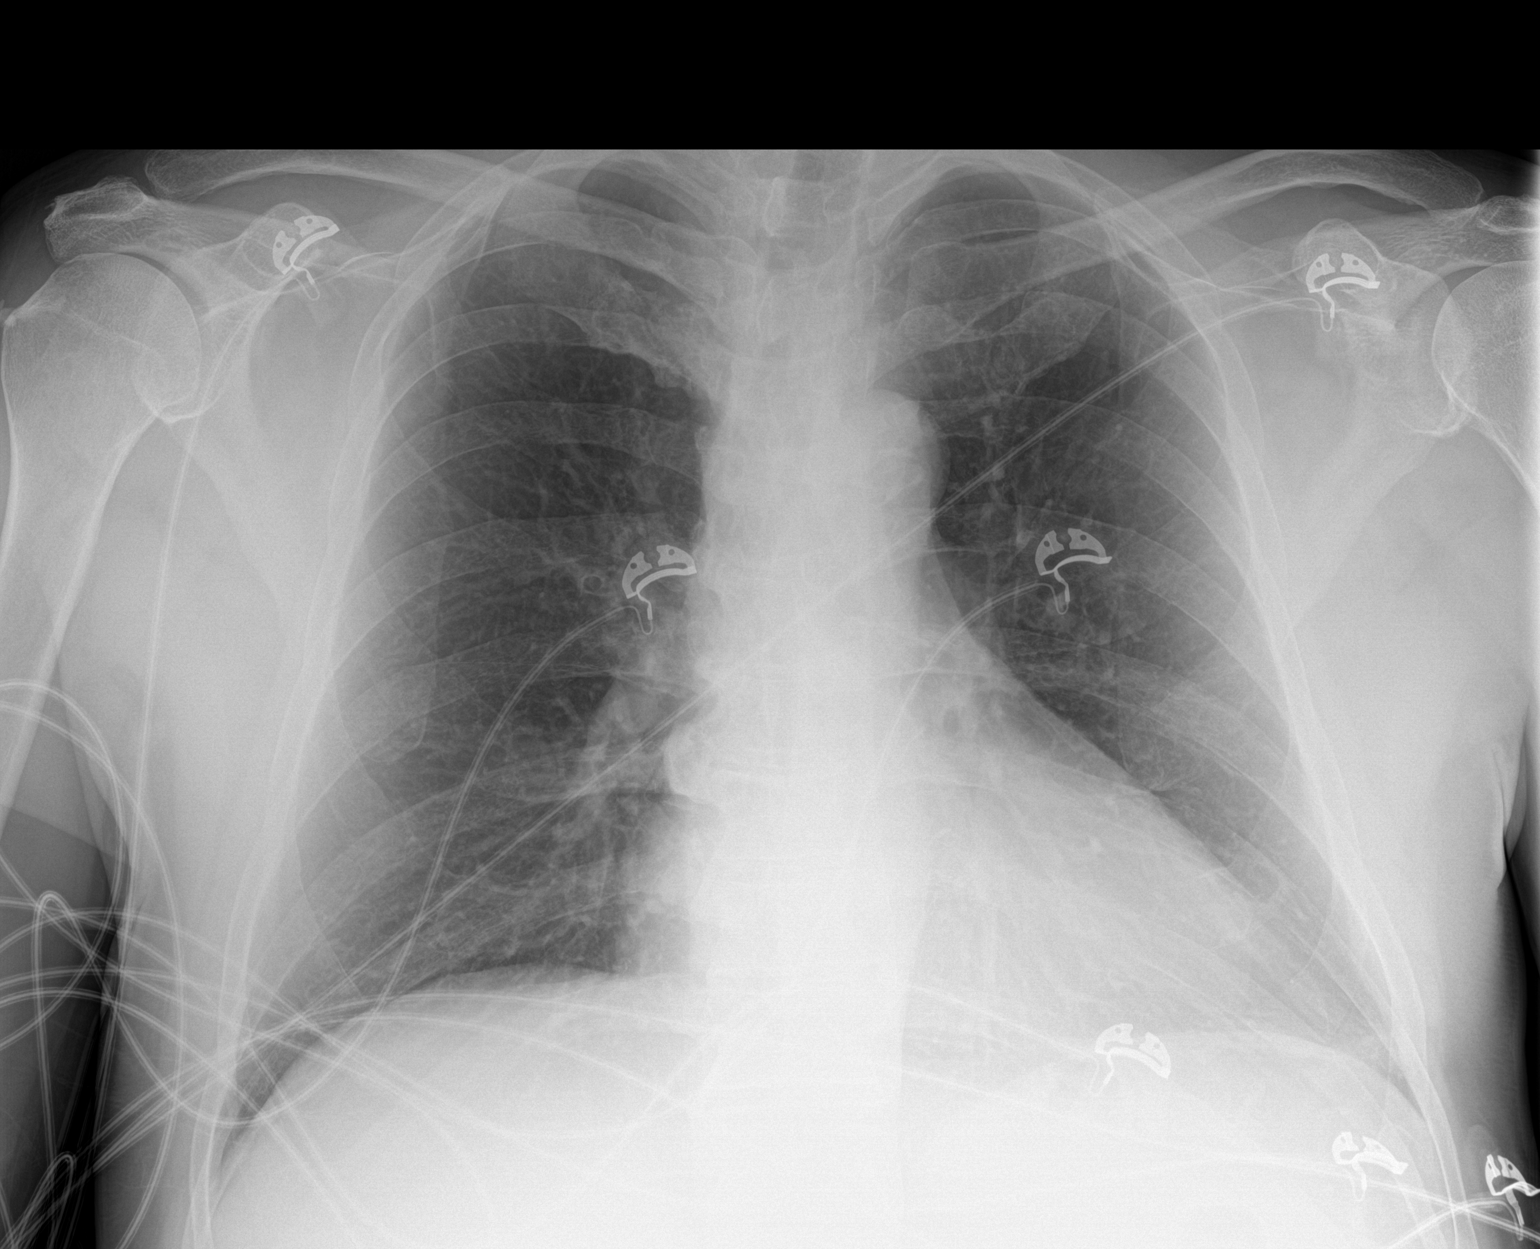

[1 of 1 positions shown; findings below may reference images not displayed]

FINDINGS: There is slight left base atelectasis. Lungs elsewhere are clear.
Heart is upper normal in size with pulmonary vascularity normal. No
adenopathy. No bone lesions.
IMPRESSION: Slight left base atelectasis. Lungs otherwise clear. Heart upper
normal in size.

## 2020-01-14 IMAGING — CT CT ANGIO NECK
2 of 11 series · 8 of 33 positions shown · IV contrast (Omnipaque)
Comparison: None.

CLINICAL DATA: Acute headache.  Normal neuro exam.

EXAM:
CT ANGIOGRAPHY HEAD AND NECK
TECHNIQUE: Multidetector CT imaging of the head and neck was performed using
the standard protocol during bolus administration of intravenous
contrast. Multiplanar CT image reconstructions and MIPs were
obtained to evaluate the vascular anatomy. Carotid stenosis
measurements (when applicable) are obtained utilizing NASCET
criteria, using the distal internal carotid diameter as the
denominator.
CONTRAST:  100mL OMNIPAQUE IOHEXOL 350 MG/ML SOLN

[Series 9: cta head/neck · axial · 0.63mm/px · z∈[-320,-40]mm · 6 of 196 slices shown]
[im 28/196  soft-tissue]
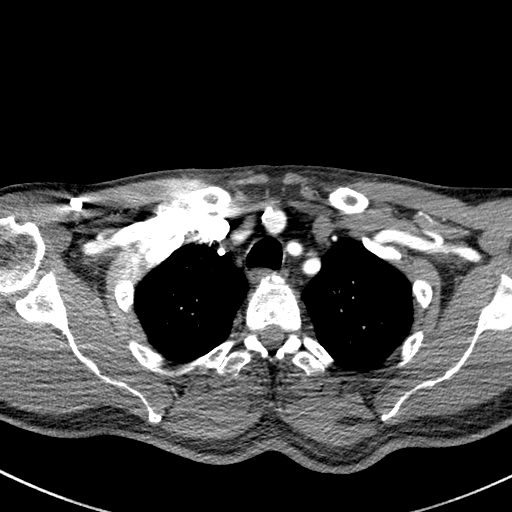
[im 56/196  bone]
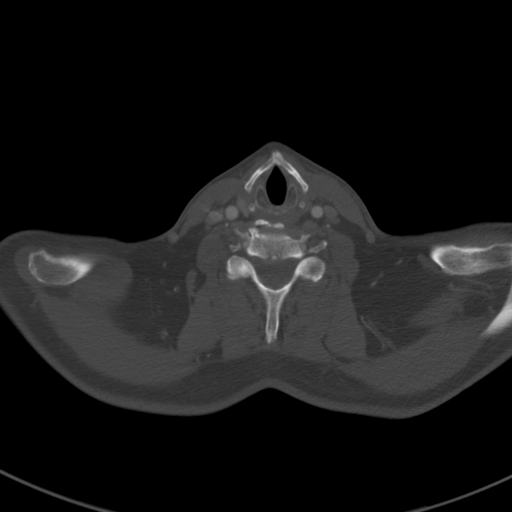
[im 84/196  soft-tissue]
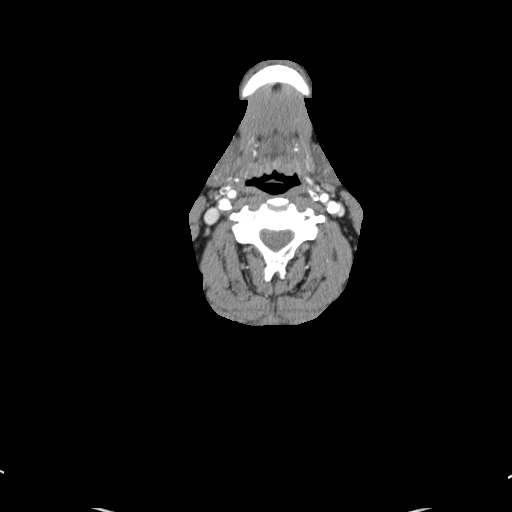
[im 112/196  bone]
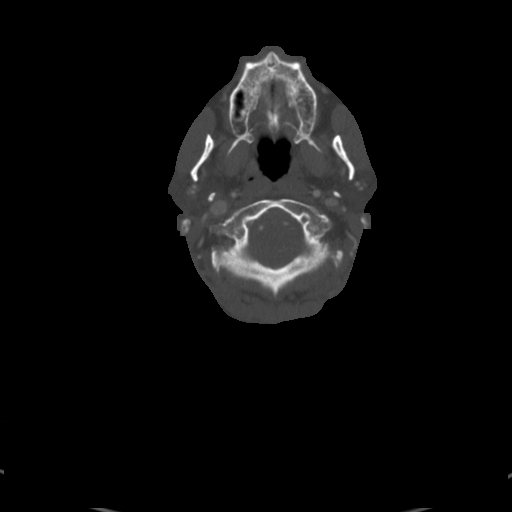
[im 140/196  soft-tissue]
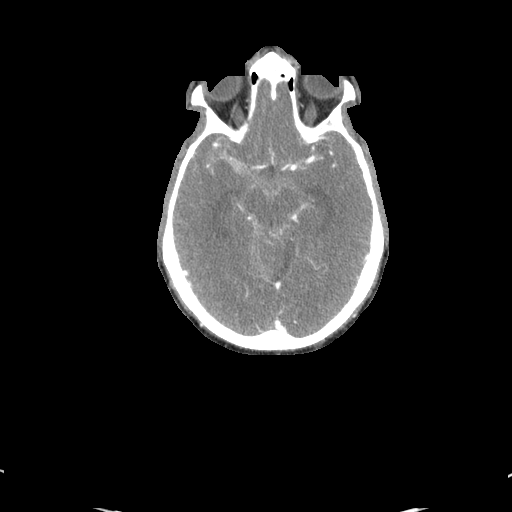
[im 168/196  bone]
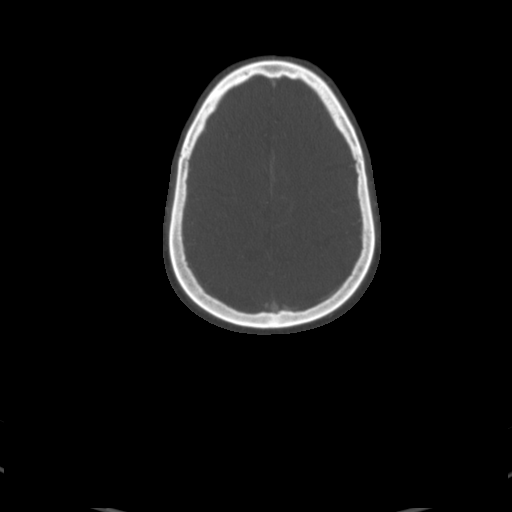

[Series 14: axial thick · axial · 0.49mm/px · z∈[-247,-117]mm · 2 of 79 slices shown]
[im 27/79  soft-tissue]
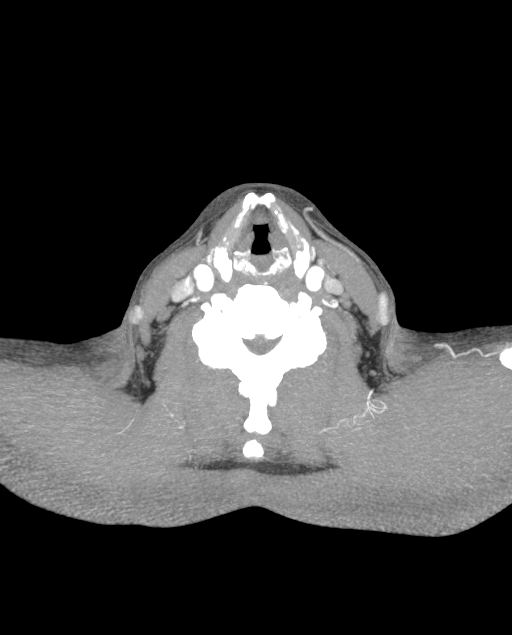
[im 53/79  soft-tissue]
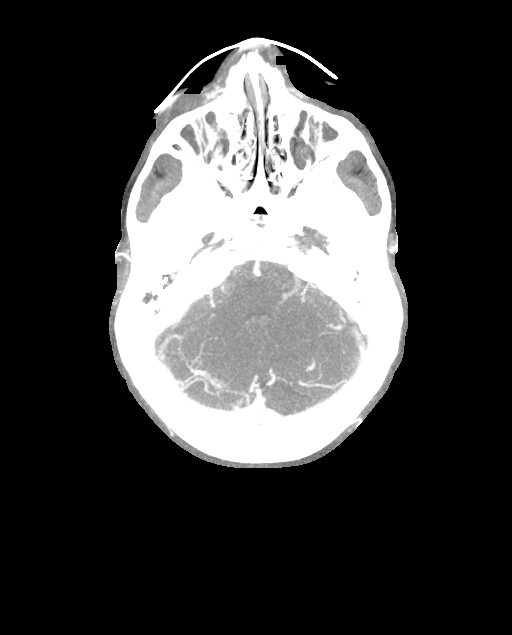

[8 of 33 positions shown; findings below may reference images not displayed]

FINDINGS: CT HEAD FINDINGS

Brain: Large volume acute subarachnoid hemorrhage. Large amount of
subarachnoid blood is seen in the basilar cisterns and extending
into the sylvian fissure bilaterally right greater than left.
Interhemispheric subarachnoid hemorrhage is present. Hemorrhage
along the right tentorium medially may represent subdural or
subarachnoid hemorrhage.

Generalized atrophy without hydrocephalus. No acute infarct or mass.

Vascular: Limited vascular evaluation due to subarachnoid
hemorrhage.

Skull: Negative

Sinuses: Negative

Orbits: Negative

Review of the MIP images confirms the above findings

CTA NECK FINDINGS

Aortic arch: Standard branching. Imaged portion shows no evidence of
aneurysm or dissection. No significant stenosis of the major arch
vessel origins. Mild atherosclerotic calcification aortic arch.

Right carotid system: Atherosclerotic calcification right carotid
bifurcation. Approximately 25% diameter stenosis right internal
carotid artery.

Left carotid system: Atherosclerotic calcification left carotid
bifurcation and carotid bulb. 50% diameter stenosis proximal left
internal carotid artery.

Vertebral arteries: Both vertebral arteries widely patent to the
basilar without significant stenosis.

Skeleton: Mild degenerative changes cervical spine without acute
skeletal abnormality.

Other neck: Negative for mass or adenopathy in the neck.

Upper chest: Negative

Review of the MIP images confirms the above findings

CTA HEAD FINDINGS

Anterior circulation: Atherosclerotic calcification in the cavernous
carotid bilaterally with mild to moderate stenosis bilaterally. No
aneurysm.

Anterior and middle cerebral arteries patent bilaterally without
large vessel occlusion. No aneurysm.

Posterior circulation: Both vertebral arteries patent to the
basilar. Basilar is small in caliber but patent. Superior cerebellar
arteries patent bilaterally. Posterior cerebral arteries patent
bilaterally. Fetal origin left posterior cerebral artery. No
aneurysm in the posterior circulation.

Venous sinuses: Normal venous enhancement.

Anatomic variants: None

Review of the MIP images confirms the above findings
IMPRESSION: 1. Large volume subarachnoid hemorrhage, right greater than left.
Possible right tentorial subdural hemorrhage versus subarachnoid
hemorrhage. Hemorrhage pattern is most likely due to aneurysm
rupture however no aneurysm identified on CTA. Recommend catheter
angiogram for further evaluation.
2. Negative for hydrocephalus.  No acute infarct.
3. Small caliber basilar artery could represent basal spasm.
diameter stenosis proximal left internal carotid artery. Mild to
moderate stenosis in the cavernous carotid bilaterally due to
atherosclerotic calcification
5. These results were called by telephone at the time of
interpretation on [DATE] at [DATE] to provider BEDE ,
who verbally acknowledged these results.

## 2020-01-14 IMAGING — CT CT ANGIO HEAD
2 of 11 series · 8 of 33 positions shown · IV contrast (Omnipaque)
Comparison: None.

CLINICAL DATA: Acute headache.  Normal neuro exam.

EXAM:
CT ANGIOGRAPHY HEAD AND NECK
TECHNIQUE: Multidetector CT imaging of the head and neck was performed using
the standard protocol during bolus administration of intravenous
contrast. Multiplanar CT image reconstructions and MIPs were
obtained to evaluate the vascular anatomy. Carotid stenosis
measurements (when applicable) are obtained utilizing NASCET
criteria, using the distal internal carotid diameter as the
denominator.
CONTRAST:  100mL OMNIPAQUE IOHEXOL 350 MG/ML SOLN

[Series 9: cta head/neck · axial · 0.63mm/px · z∈[-320,-40]mm · 6 of 196 slices shown]
[im 28/196  soft-tissue]
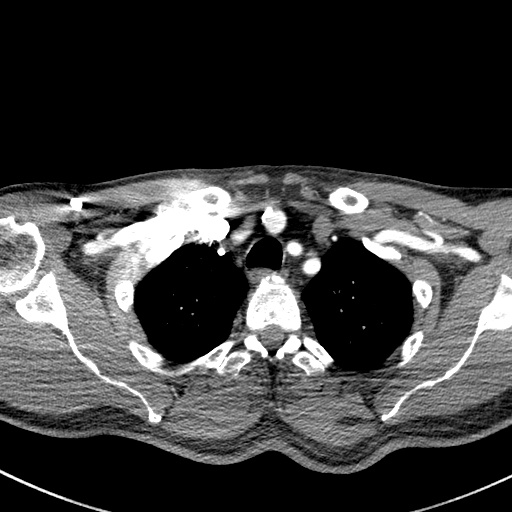
[im 56/196  bone]
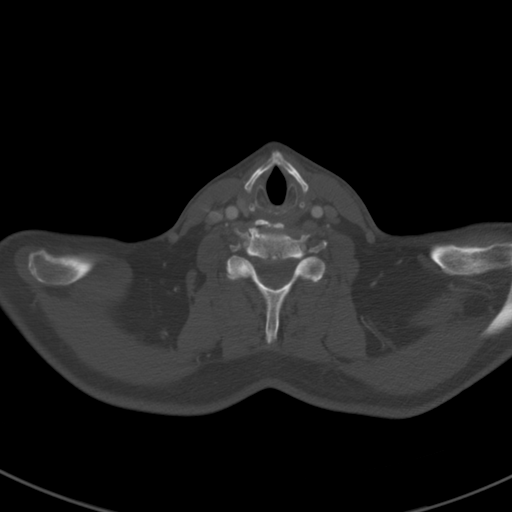
[im 84/196  soft-tissue]
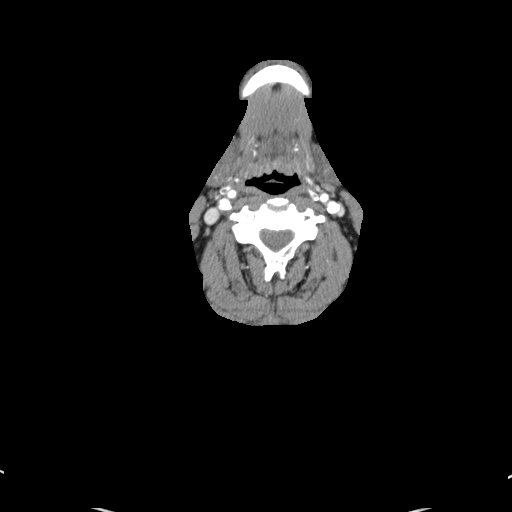
[im 112/196  bone]
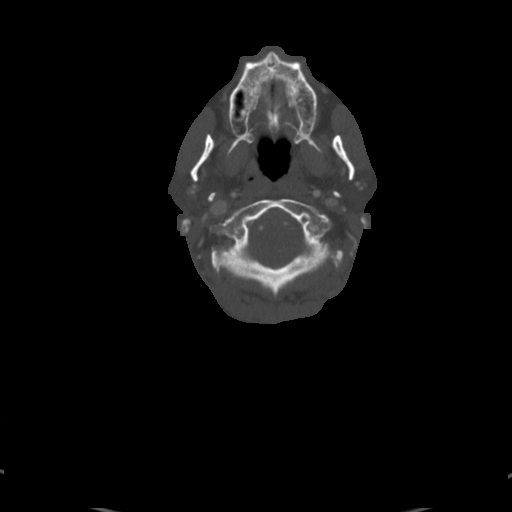
[im 140/196  soft-tissue]
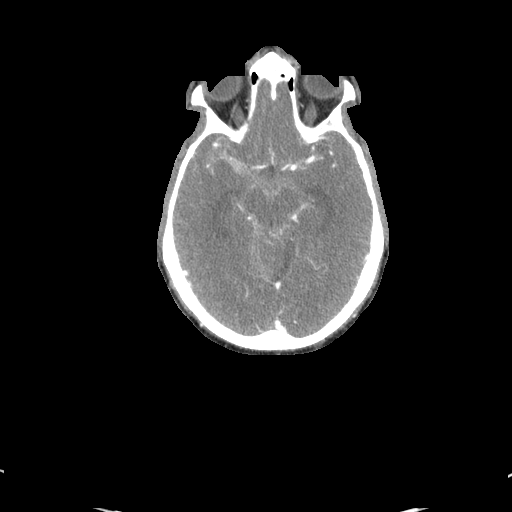
[im 168/196  bone]
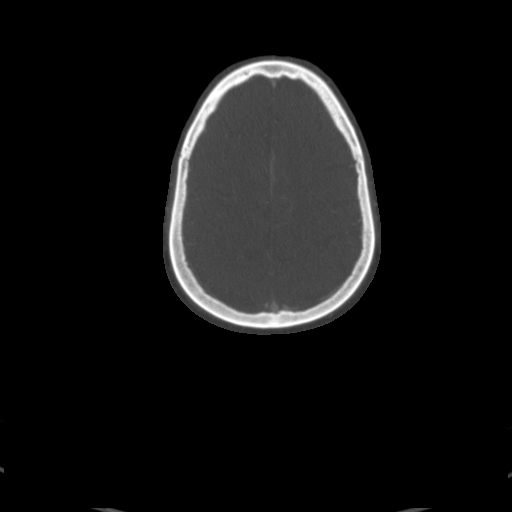

[Series 14: axial thick · axial · 0.49mm/px · z∈[-247,-117]mm · 2 of 79 slices shown]
[im 27/79  soft-tissue]
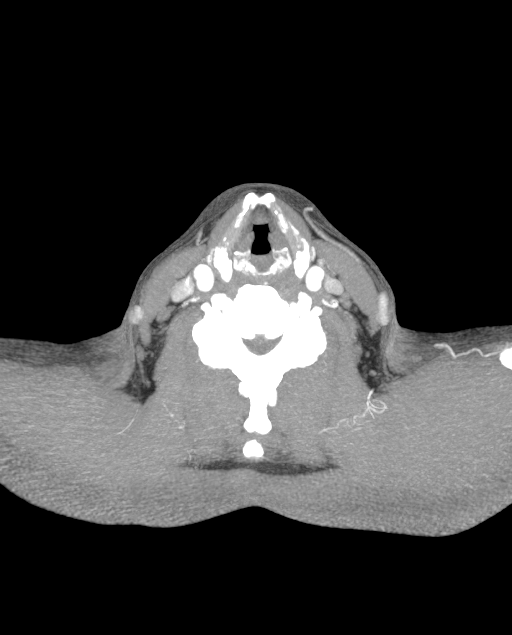
[im 53/79  soft-tissue]
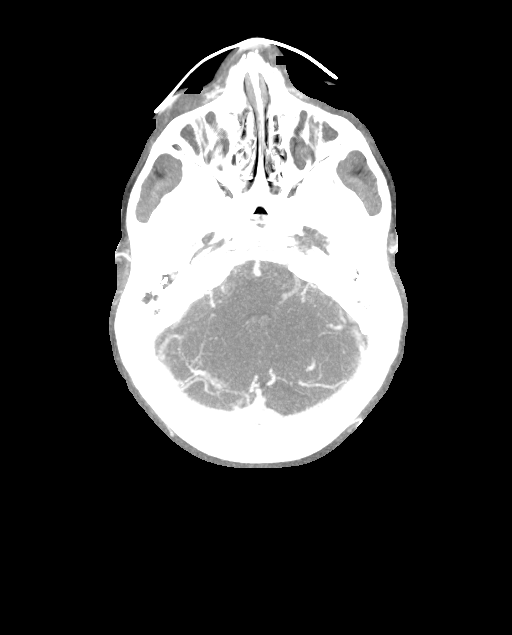

[8 of 33 positions shown; findings below may reference images not displayed]

FINDINGS: CT HEAD FINDINGS

Brain: Large volume acute subarachnoid hemorrhage. Large amount of
subarachnoid blood is seen in the basilar cisterns and extending
into the sylvian fissure bilaterally right greater than left.
Interhemispheric subarachnoid hemorrhage is present. Hemorrhage
along the right tentorium medially may represent subdural or
subarachnoid hemorrhage.

Generalized atrophy without hydrocephalus. No acute infarct or mass.

Vascular: Limited vascular evaluation due to subarachnoid
hemorrhage.

Skull: Negative

Sinuses: Negative

Orbits: Negative

Review of the MIP images confirms the above findings

CTA NECK FINDINGS

Aortic arch: Standard branching. Imaged portion shows no evidence of
aneurysm or dissection. No significant stenosis of the major arch
vessel origins. Mild atherosclerotic calcification aortic arch.

Right carotid system: Atherosclerotic calcification right carotid
bifurcation. Approximately 25% diameter stenosis right internal
carotid artery.

Left carotid system: Atherosclerotic calcification left carotid
bifurcation and carotid bulb. 50% diameter stenosis proximal left
internal carotid artery.

Vertebral arteries: Both vertebral arteries widely patent to the
basilar without significant stenosis.

Skeleton: Mild degenerative changes cervical spine without acute
skeletal abnormality.

Other neck: Negative for mass or adenopathy in the neck.

Upper chest: Negative

Review of the MIP images confirms the above findings

CTA HEAD FINDINGS

Anterior circulation: Atherosclerotic calcification in the cavernous
carotid bilaterally with mild to moderate stenosis bilaterally. No
aneurysm.

Anterior and middle cerebral arteries patent bilaterally without
large vessel occlusion. No aneurysm.

Posterior circulation: Both vertebral arteries patent to the
basilar. Basilar is small in caliber but patent. Superior cerebellar
arteries patent bilaterally. Posterior cerebral arteries patent
bilaterally. Fetal origin left posterior cerebral artery. No
aneurysm in the posterior circulation.

Venous sinuses: Normal venous enhancement.

Anatomic variants: None

Review of the MIP images confirms the above findings
IMPRESSION: 1. Large volume subarachnoid hemorrhage, right greater than left.
Possible right tentorial subdural hemorrhage versus subarachnoid
hemorrhage. Hemorrhage pattern is most likely due to aneurysm
rupture however no aneurysm identified on CTA. Recommend catheter
angiogram for further evaluation.
2. Negative for hydrocephalus.  No acute infarct.
3. Small caliber basilar artery could represent basal spasm.
diameter stenosis proximal left internal carotid artery. Mild to
moderate stenosis in the cavernous carotid bilaterally due to
atherosclerotic calcification
5. These results were called by telephone at the time of
interpretation on [DATE] at [DATE] to provider BEDE ,
who verbally acknowledged these results.

## 2020-01-14 MED ORDER — NIMODIPINE 30 MG PO CAPS
60.0000 mg | ORAL_CAPSULE | ORAL | Status: DC
  Administered 2020-01-15 – 2020-01-19 (×19): 60 mg via ORAL
  Filled 2020-01-14 (×14): qty 2

## 2020-01-14 MED ORDER — LEVETIRACETAM IN NACL 500 MG/100ML IV SOLN
500.0000 mg | Freq: Two times a day (BID) | INTRAVENOUS | Status: DC
  Administered 2020-01-14 – 2020-01-16 (×4): 500 mg via INTRAVENOUS
  Filled 2020-01-14 (×5): qty 100

## 2020-01-14 MED ORDER — DEXAMETHASONE SODIUM PHOSPHATE 10 MG/ML IJ SOLN
10.0000 mg | Freq: Four times a day (QID) | INTRAMUSCULAR | Status: DC
  Administered 2020-01-14 – 2020-01-15 (×4): 10 mg via INTRAVENOUS
  Filled 2020-01-14 (×6): qty 1

## 2020-01-14 MED ORDER — VITAMIN D 25 MCG (1000 UNIT) PO TABS
1000.0000 [IU] | ORAL_TABLET | Freq: Every day | ORAL | Status: DC
  Administered 2020-01-16 – 2020-01-30 (×14): 1000 [IU] via ORAL
  Filled 2020-01-14 (×16): qty 1

## 2020-01-14 MED ORDER — PANTOPRAZOLE SODIUM 40 MG IV SOLR
40.0000 mg | Freq: Every day | INTRAVENOUS | Status: DC
  Administered 2020-01-14 – 2020-01-15 (×2): 40 mg via INTRAVENOUS
  Filled 2020-01-14 (×2): qty 40

## 2020-01-14 MED ORDER — VANCOMYCIN HCL IN DEXTROSE 1-5 GM/200ML-% IV SOLN
1000.0000 mg | Freq: Once | INTRAVENOUS | Status: DC
  Filled 2020-01-14: qty 200

## 2020-01-14 MED ORDER — NICARDIPINE HCL IN NACL 20-0.86 MG/200ML-% IV SOLN
3.0000 mg/h | INTRAVENOUS | Status: DC
  Administered 2020-01-14: 5 mg/h via INTRAVENOUS
  Filled 2020-01-14 (×2): qty 200

## 2020-01-14 MED ORDER — INSULIN DEGLUDEC 200 UNIT/ML ~~LOC~~ SOPN: 35.0000 [IU] | PEN_INJECTOR | Freq: Every day | SUBCUTANEOUS | Status: DC

## 2020-01-14 MED ORDER — HYDROMORPHONE HCL 1 MG/ML IJ SOLN
1.0000 mg | Freq: Once | INTRAMUSCULAR | Status: AC
  Administered 2020-01-14: 1 mg via INTRAVENOUS
  Filled 2020-01-14: qty 1

## 2020-01-14 MED ORDER — VANCOMYCIN HCL IN DEXTROSE 1-5 GM/200ML-% IV SOLN: 1000.0000 mg | Freq: Once | INTRAVENOUS | Status: DC

## 2020-01-14 MED ORDER — INSULIN ASPART 100 UNIT/ML ~~LOC~~ SOLN
0.0000 [IU] | SUBCUTANEOUS | Status: DC
  Administered 2020-01-14: 20 [IU] via SUBCUTANEOUS
  Administered 2020-01-14: 15 [IU] via SUBCUTANEOUS
  Administered 2020-01-15 (×2): 11 [IU] via SUBCUTANEOUS
  Administered 2020-01-15: 20 [IU] via SUBCUTANEOUS
  Administered 2020-01-15: 7 [IU] via SUBCUTANEOUS
  Administered 2020-01-15: 15 [IU] via SUBCUTANEOUS
  Administered 2020-01-16: 4 [IU] via SUBCUTANEOUS
  Administered 2020-01-16: 15 [IU] via SUBCUTANEOUS
  Administered 2020-01-16: 4 [IU] via SUBCUTANEOUS

## 2020-01-14 MED ORDER — METRONIDAZOLE IN NACL 5-0.79 MG/ML-% IV SOLN
500.0000 mg | Freq: Once | INTRAVENOUS | Status: DC
  Filled 2020-01-14: qty 100

## 2020-01-14 MED ORDER — INSULIN DETEMIR 100 UNIT/ML ~~LOC~~ SOLN
50.0000 [IU] | SUBCUTANEOUS | Status: DC
  Filled 2020-01-14: qty 0.5

## 2020-01-14 MED ORDER — DAPAGLIFLOZIN PRO-METFORMIN ER 10-1000 MG PO TB24: 1.0000 | ORAL_TABLET | Freq: Every day | ORAL | Status: DC

## 2020-01-14 MED ORDER — VANCOMYCIN HCL 2000 MG/400ML IV SOLN
2000.0000 mg | INTRAVENOUS | Status: DC
  Administered 2020-01-14: 15:00:00 2000 mg via INTRAVENOUS
  Filled 2020-01-14: qty 400

## 2020-01-14 MED ORDER — INSULIN ASPART 100 UNIT/ML ~~LOC~~ SOLN
0.0000 [IU] | SUBCUTANEOUS | Status: DC
  Administered 2020-01-14: 11 [IU] via SUBCUTANEOUS

## 2020-01-14 MED ORDER — SODIUM CHLORIDE 0.9 % IV SOLN
2.0000 g | Freq: Three times a day (TID) | INTRAVENOUS | Status: DC
  Administered 2020-01-14: 2 g via INTRAVENOUS
  Filled 2020-01-14 (×2): qty 2

## 2020-01-14 MED ORDER — LISINOPRIL 20 MG PO TABS
20.0000 mg | ORAL_TABLET | Freq: Every day | ORAL | Status: DC
  Administered 2020-01-14 – 2020-01-17 (×4): 20 mg via ORAL
  Filled 2020-01-14: qty 1
  Filled 2020-01-14: qty 2
  Filled 2020-01-14 (×3): qty 1

## 2020-01-14 MED ORDER — CHLORHEXIDINE GLUCONATE CLOTH 2 % EX PADS
6.0000 | MEDICATED_PAD | Freq: Every day | CUTANEOUS | Status: DC
  Administered 2020-01-15 – 2020-02-02 (×19): 6 via TOPICAL

## 2020-01-14 MED ORDER — SODIUM CHLORIDE 0.9 % IV SOLN
INTRAVENOUS | Status: AC
  Filled 2020-01-14: qty 2

## 2020-01-14 MED ORDER — FENOFIBRATE 160 MG PO TABS
160.0000 mg | ORAL_TABLET | Freq: Every day | ORAL | Status: DC
  Administered 2020-01-16 – 2020-01-23 (×7): 160 mg via ORAL
  Filled 2020-01-14 (×10): qty 1

## 2020-01-14 MED ORDER — INSULIN DETEMIR 100 UNIT/ML ~~LOC~~ SOLN: 25.0000 [IU] | SUBCUTANEOUS | Status: DC

## 2020-01-14 MED ORDER — ACETAMINOPHEN 325 MG PO TABS
650.0000 mg | ORAL_TABLET | ORAL | Status: DC | PRN
  Administered 2020-01-19 – 2020-01-27 (×10): 650 mg via ORAL
  Filled 2020-01-14 (×10): qty 2

## 2020-01-14 MED ORDER — ACETAMINOPHEN 160 MG/5ML PO SOLN
650.0000 mg | ORAL | Status: DC | PRN
  Administered 2020-01-18 – 2020-02-02 (×7): 650 mg
  Filled 2020-01-14 (×7): qty 20.3

## 2020-01-14 MED ORDER — SODIUM CHLORIDE 0.9 % IV SOLN: 1000.0000 mL | INTRAVENOUS | Status: DC

## 2020-01-14 MED ORDER — STROKE: EARLY STAGES OF RECOVERY BOOK
Freq: Once | Status: AC
  Filled 2020-01-14: qty 1

## 2020-01-14 MED ORDER — INSULIN DETEMIR 100 UNIT/ML ~~LOC~~ SOLN
30.0000 [IU] | Freq: Once | SUBCUTANEOUS | Status: AC
  Administered 2020-01-14: 30 [IU] via SUBCUTANEOUS
  Filled 2020-01-14: qty 0.3

## 2020-01-14 MED ORDER — HYDROMORPHONE HCL 1 MG/ML IJ SOLN
0.5000 mg | INTRAMUSCULAR | Status: DC | PRN
  Administered 2020-01-15 – 2020-01-17 (×4): 0.5 mg via INTRAVENOUS
  Filled 2020-01-14 (×4): qty 1

## 2020-01-14 MED ORDER — ACETAMINOPHEN 650 MG RE SUPP: 650.0000 mg | RECTAL | Status: DC | PRN

## 2020-01-14 MED ORDER — INSULIN DETEMIR 100 UNIT/ML ~~LOC~~ SOLN
20.0000 [IU] | SUBCUTANEOUS | Status: DC
  Administered 2020-01-14: 20 [IU] via SUBCUTANEOUS
  Filled 2020-01-14: qty 0.2

## 2020-01-14 MED ORDER — CLEVIDIPINE BUTYRATE 0.5 MG/ML IV EMUL
0.0000 mg/h | INTRAVENOUS | Status: DC
  Administered 2020-01-14: 23:00:00 11 mg/h via INTRAVENOUS
  Administered 2020-01-14: 1 mg/h via INTRAVENOUS
  Administered 2020-01-15: 12 mg/h via INTRAVENOUS
  Administered 2020-01-15: 21 mg/h via INTRAVENOUS
  Administered 2020-01-15: 10 mg/h via INTRAVENOUS
  Administered 2020-01-15: 21 mg/h via INTRAVENOUS
  Administered 2020-01-15: 15 mg/h via INTRAVENOUS
  Administered 2020-01-15: 21 mg/h via INTRAVENOUS
  Administered 2020-01-15: 15 mg/h via INTRAVENOUS
  Administered 2020-01-16: 9 mg/h via INTRAVENOUS
  Filled 2020-01-14 (×2): qty 50
  Filled 2020-01-14: qty 100
  Filled 2020-01-14 (×3): qty 50
  Filled 2020-01-14 (×2): qty 100
  Filled 2020-01-14: qty 50

## 2020-01-14 MED ORDER — SODIUM CHLORIDE 0.9 % IV SOLN: INTRAVENOUS | Status: DC

## 2020-01-14 MED ORDER — VANCOMYCIN HCL 2000 MG/400ML IV SOLN
2000.0000 mg | INTRAVENOUS | Status: DC
  Filled 2020-01-14: qty 400

## 2020-01-14 MED ORDER — ONDANSETRON HCL 4 MG/2ML IJ SOLN
4.0000 mg | Freq: Four times a day (QID) | INTRAMUSCULAR | Status: DC
  Administered 2020-01-14 – 2020-01-15 (×3): 4 mg via INTRAVENOUS
  Filled 2020-01-14 (×2): qty 2

## 2020-01-14 MED ORDER — INSULIN ASPART 100 UNIT/ML ~~LOC~~ SOLN: 0.0000 [IU] | Freq: Three times a day (TID) | SUBCUTANEOUS | Status: DC

## 2020-01-14 MED ORDER — INSULIN ASPART 100 UNIT/ML ~~LOC~~ SOLN: 4.0000 [IU] | Freq: Three times a day (TID) | SUBCUTANEOUS | Status: DC

## 2020-01-14 MED ORDER — SODIUM CHLORIDE 0.9 % IV SOLN
2.0000 g | Freq: Once | INTRAVENOUS | Status: AC
  Administered 2020-01-14: 2 g via INTRAVENOUS
  Filled 2020-01-14: qty 2

## 2020-01-14 MED ORDER — ROSUVASTATIN CALCIUM 20 MG PO TABS
40.0000 mg | ORAL_TABLET | Freq: Every day | ORAL | Status: DC
  Administered 2020-01-16 – 2020-01-31 (×15): 40 mg via ORAL
  Filled 2020-01-14 (×17): qty 2

## 2020-01-14 MED ORDER — INSULIN ASPART 100 UNIT/ML FLEXPEN: 75.0000 [IU] | PEN_INJECTOR | Freq: Every day | SUBCUTANEOUS | Status: DC

## 2020-01-14 MED ORDER — NICARDIPINE HCL IN NACL 20-0.86 MG/200ML-% IV SOLN
0.0000 mg/h | INTRAVENOUS | Status: DC
  Administered 2020-01-14: 16:00:00 15 mg/h via INTRAVENOUS
  Administered 2020-01-14: 5 mg/h via INTRAVENOUS
  Filled 2020-01-14: qty 200

## 2020-01-14 MED ORDER — ONDANSETRON HCL 4 MG/2ML IJ SOLN
INTRAMUSCULAR | Status: AC
  Filled 2020-01-14: qty 2

## 2020-01-14 MED ORDER — ACETAMINOPHEN-CODEINE #3 300-30 MG PO TABS
1.0000 | ORAL_TABLET | ORAL | Status: DC | PRN
  Administered 2020-01-15 – 2020-01-16 (×2): 1 via ORAL
  Administered 2020-01-16 (×2): 2 via ORAL
  Administered 2020-01-17: 1 via ORAL
  Administered 2020-01-17: 2 via ORAL
  Filled 2020-01-14: qty 1
  Filled 2020-01-14: qty 2
  Filled 2020-01-14: qty 1
  Filled 2020-01-14: qty 2
  Filled 2020-01-14 (×2): qty 1
  Filled 2020-01-14: qty 2

## 2020-01-14 MED ORDER — INSULIN DETEMIR 100 UNIT/ML FLEXPEN: 20.0000 [IU] | PEN_INJECTOR | SUBCUTANEOUS | Status: DC

## 2020-01-14 MED ORDER — SENNOSIDES-DOCUSATE SODIUM 8.6-50 MG PO TABS
1.0000 | ORAL_TABLET | Freq: Two times a day (BID) | ORAL | Status: DC
  Administered 2020-01-15 – 2020-02-01 (×19): 1 via ORAL
  Filled 2020-01-14 (×22): qty 1

## 2020-01-14 MED ORDER — NIMODIPINE 6 MG/ML PO SOLN
60.0000 mg | ORAL | Status: DC
  Administered 2020-01-18 – 2020-01-19 (×4): 60 mg
  Filled 2020-01-14 (×5): qty 10

## 2020-01-14 MED ORDER — ATORVASTATIN CALCIUM 80 MG PO TABS: 80.0000 mg | ORAL_TABLET | Freq: Every day | ORAL | Status: DC

## 2020-01-14 MED ORDER — SODIUM CHLORIDE 0.9 % IV SOLN
INTRAVENOUS | Status: DC | PRN
  Administered 2020-01-14: 11:00:00 250 mL via INTRAVENOUS

## 2020-01-14 MED ORDER — IOHEXOL 350 MG/ML SOLN
100.0000 mL | Freq: Once | INTRAVENOUS | Status: AC | PRN
  Administered 2020-01-14: 100 mL via INTRAVENOUS

## 2020-01-14 MED ORDER — LABETALOL HCL 5 MG/ML IV SOLN
5.0000 mg | INTRAVENOUS | Status: DC | PRN
  Administered 2020-01-15 – 2020-01-17 (×8): 5 mg via INTRAVENOUS
  Filled 2020-01-14 (×6): qty 4

## 2020-01-14 NOTE — ED Notes (Signed)
Attempted report x1. 

## 2020-01-14 NOTE — ED Notes (Signed)
Assisted pt with urinal in bed.

## 2020-01-14 NOTE — Progress Notes (Signed)
Pharmacy Antibiotic Note  Nicholas [name] is a 68 y.o. male admitted on 01/14/2020 with sepsis.  Pharmacy has been consulted for Vancomycin and Cefepime dosing. CrCl 70 ml/min.  Vancomycin 2000 mg IV Q 24 hrs. Goal AUC 400-550. Expected AUC: 486 SCr used: 1.13  Plan: Cefepime 2 grams IV q8hr Vanc 2000 mg IV q24 hr Monitor renal function, C&S and vanc levels as needed  Height: 6' (182.9 cm) Weight: 90.7 kg (200 lb) IBW/kg (Calculated) : 77.6  Temp (24hrs), Avg:98.9 F (37.2 C), Min:98.6 F (37 C), Max:99.1 F (37.3 C)  Recent Labs  Lab 01/14/20 1021  WBC 14.6*  CREATININE 1.13  LATICACIDVEN 3.2*    Estimated Creatinine Clearance: 70.6 mL/min (by C-G formula based on SCr of 1.13 mg/dL).    No Known Allergies  Antimicrobials this admission: Cefep 4/26 >>  Vanc 4/26 >>   Thank you for allowing pharmacy to be a part of this patient's care.  Jeanella Cara, PharmD, [facility] Clinical Pharmacist Please see AMION for all Pharmacists' Contact Phone Numbers 01/14/2020, 11:29 AM

## 2020-01-14 NOTE — ED Notes (Signed)
Wife at bedside.

## 2020-01-14 NOTE — ED Notes (Signed)
Patient transported to CT 

## 2020-01-14 NOTE — Consult Note (Signed)
NAME:  HEMI CHACKO, MRN:  [id number], DOB:  [birth year], LOS: 0 ADMISSION DATE:  01/14/2020, CONSULTATION DATE:  01/14/20 REFERRING MD:  Donalee Citrin, CHIEF COMPLAINT:  SAH   Brief History   69 y/o M with Hx of DM, HTN and BPH presents as transfer from [facility] with large SAH, aneurysm suspected but not visualized on CTA. Plan for Angiogram on 4/27. PCCM consulted for BP control and other medical issues.  History of present illness   Mr Savastano is a 69 y/o M with Hx of DM, HTN and BPH presents as transfer from [facility] with large SAH. He began to have headache the evening PTA. He awoke the next morning with confusion, nausea, and vomiting. EMS was called and he was transported to [facility] where CTA shoe SAH. Neursurgery is primary, PCCM consulted for BP control and other medical issues.  Past Medical History  DM HTN BPH  Significant Hospital Events   4/36: Admit  Consults:  PCCM  Procedures:    Significant Diagnostic Tests:  4/26 CT Angiogram Head Neck: IMPRESSION: 1. Large volume subarachnoid hemorrhage, right greater than left. Possible right tentorial subdural hemorrhage versus subarachnoid hemorrhage. 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 Recommend catheter angiogram for further evaluation. 2. Negative for hydrocephalus.  No acute infarct. 3. Small caliber basilar artery could represent basal spasm. 4. 25% diameter stenosis proximal right internal carotid artery. 50% diameter stenosis proximal left internal carotid artery. Mild to moderate stenosis in the cavernous carotid bilaterally due to atherosclerotic calcification  Micro Data:  4/26 Coronavirus PCR: Negative 4/26 Blood Cultures: Pending  Antimicrobials:  Vancomycin 4/16 Cefepime 4/16 Flagyl 4/16  Interim history/subjective:  Arousable to voice. Alert and oriented x1-2 (name and hospital). Reports headache is improving.  Forgets why he is in the hospital.  Objective   Blood pressure (!) 149/65, pulse (!) 127, temperature 99.1 F (37.3 C), temperature source Axillary, resp. rate (!) 21, height 6' (1.829 m), weight 90.7 kg, SpO2 92 %.        Intake/Output Summary (Last 24 hours) at 01/14/2020 1823 Last data filed at 01/14/2020 1439 Gross per 24 hour  Intake 106.3 ml  Output 900 ml  Net -793.7 ml   Filed Weights   01/14/20 0936  Weight: 90.7 kg    Examination: General: Alert, Middle aged male, NAD HENT: [place]/AT, Moist MM, Lungs: CTAB Cardiovascular: RRR, systolic murmur Abdomen: Soft, protuberant abdomen, BS+ Extremities: No edema. Warm and dry Neuro: Patient is awake, alert, oriented x1-2 II: Pupils equal, round, and reactive to light.   III,IV, VI: EOMI without ptosis or diploplia.  V: Facial sensation is symmetric to light touch VII: Facial movement is symmetric.  VIII: hearing is intact to voice XII: tongue is midline without atrophy or fasciculations.  Motor: Good effort thorughout, at Least 5/5 bilateral UE, 5/5 bilateral lower extremitiy  Sensory: Sensation is grossly intact bilateral UEs & LEs  Resolved Hospital Problem list    Assessment & Plan:   HTN Subarachnoid Hemorrhage: Severe H/A x1 day followed by N/V and confusion. 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 Hunter & Hess classification Grade III.  - Primary Management per neurosurgery - Keep BP <140 systolic - Cleviprex gtt, PRN labetalol, home ACE-I - Keep Euvolemic, NS @ 75cc/hr, I/O, Daily Weight - Keppra, Dexamethasone, Nimodipine per Neuro (okay to hold nimodipine while NPO today) - PRN Zofran for  nausea - PRN Dilaudid for pain - Follow BMP  Diabetes: Most Recent PCP note from [facility] on 10/04/2019 indicated Levemir 75U Daily, Novolog 25U TID AC, and Oral Medicaitons - Levemir 50U Daily - Resistant SSI  HLD: - Continue Fenofibrate, and rosuvastatin when able to tolerate  PO  Leukocytosis: Likely reactive in the setting of SAH. Broad spectrum Abx ordered in ED due to concern for possible Sepsis on initial presentation. - D/C antibiotics - Follow up Blood Cultures - Monitor Fever Curve, CBC  Murmur: - Check Baseline Echocardiogram  Best practice:  Diet: NPO Pain/Anxiety/Delirium protocol (if indicated): Dilauded PRN VAP protocol (if indicated): N/A DVT prophylaxis: SCDs GI prophylaxis: PPI Glucose control: Levemir, SSI Mobility: BR Code Status: Full Family Communication: Updated at bedside Disposition:   Labs   CBC: Recent Labs  Lab 01/14/20 1021 01/14/20 1022  WBC 14.6*  --   NEUTROABS 13.7*  --   HGB 15.9 16.7  HCT 46.7 49.0  MCV 85.4  --   PLT 232  --     Basic Metabolic Panel: Recent Labs  Lab 01/14/20 1021 01/14/20 1022  NA 139 141  K 3.9 3.9  CL 101  --   CO2 20*  --   GLUCOSE 343*  --   BUN 27*  --   CREATININE 1.13  --   CALCIUM 9.7  --    GFR: Estimated Creatinine Clearance: 70.6 mL/min (by C-G formula based on SCr of 1.13 mg/dL). Recent Labs  Lab 01/14/20 1021 01/14/20 1230  WBC 14.6*  --   LATICACIDVEN 3.2* 2.7*    Liver Function Tests: Recent Labs  Lab 01/14/20 1021  AST 28  ALT 41  ALKPHOS 44  BILITOT 1.3*  PROT 8.8*  ALBUMIN 5.2*   No results for input(s): LIPASE, AMYLASE in the last 168 hours. Recent Labs  Lab 01/14/20 1021  AMMONIA 17    ABG    Component Value Date/Time   HCO3 22.9 01/14/2020 1022   TCO2 24 01/14/2020 1022   O2SAT 90.0 01/14/2020 1022     Coagulation Profile: Recent Labs  Lab 01/14/20 1022  INR 1.1    Cardiac Enzymes: No results for input(s): CKTOTAL, CKMB, CKMBINDEX, TROPONINI in the last 168 hours.  HbA1C: Hemoglobin A1C  Date/Time Value Ref Range Status  01/22/2014 10:09 AM 7.4  Final   Hgb A1c MFr Bld  Date/Time Value Ref Range Status  01/14/2020 05:15 PM 7.7 (H) 4.8 - 5.6 % Final    Comment:    (NOTE) Pre diabetes:           5.7%-6.4% Diabetes:              >6.4% Glycemic control for   <7.0% adults with diabetes   10/16/2013 08:55 AM 8.2 (H) <5.7 % Final    Comment:                                                                           According to the ADA Clinical Practice Recommendations for 2011, when HbA1c is used as a screening test:     >=6.5%   Diagnostic of Diabetes Mellitus            (if abnormal result is  confirmed)   5.7-6.4%   Increased risk of developing Diabetes Mellitus   References:Diagnosis and Classification of Diabetes Mellitus,Diabetes Care,2011,34(Suppl 1):S62-S69 and Standards of Medical Care in         Diabetes - 2011,Diabetes Care,2011,34 (Suppl 1):S11-S61.      CBG: Recent Labs  Lab 01/14/20 1020 01/14/20 1742  GLUCAP 324* 369*    Review of Systems:   Negative except as per HPI.  Past Medical History  He,  has a past medical history of BPH (benign prostatic hypertrophy) (12/20/2011), Colon polyp, Deviated septum, Diabetes mellitus without complication (HCC), Hepatitis A, HSV-1 (herpes simplex virus 1) infection, Hyperlipidemia, Hypertension, and Migraine headache.   Surgical History    Past Surgical History:  Procedure Laterality Date  . NASAL SEPTUM SURGERY       Social History   reports that he has never smoked. He has never used smokeless tobacco. He reports current alcohol use. He reports that he does not use drugs.   Family History   His family history includes Cancer in his father; Diabetes in his brother, father, mother, and sister; Neuropathy in his mother.   Allergies No Known Allergies   Home Medications  Prior to Admission medications   Medication Sig Start Date End Date Taking? Authorizing Provider  aspirin (ASPIRIN ADULT LOW STRENGTH) 81 MG EC tablet Take 81 mg by mouth daily. Swallow whole.   Yes [provider]  atorvastatin (LIPITOR) 80 MG tablet Take 1 tablet (80 mg total) by mouth daily. 07/17/13 01/14/20 Yes Zanard, Hinton Dyer, MD   cholecalciferol (VITAMIN D3) 25 MCG (1000 UNIT) tablet Take 1,000 Units by mouth daily.   Yes [provider]  CRESTOR 40 MG tablet Take 1 tablet (40 mg total) by mouth daily. 04/01/14 01/14/20 Yes Zanard, Hinton Dyer, MD  fenofibrate 160 MG tablet Take 1 tablet (160 mg total) by mouth daily. 01/22/14 01/14/20 Yes Zanard, Hinton Dyer, MD  insulin aspart (NOVOLOG) 100 UNIT/ML FlexPen Inject 75 Units into the skin daily.  10/04/19 10/03/20 Yes [provider]  insulin degludec (TRESIBA) 200 UNIT/ML FlexTouch Pen Inject 35 Units into the skin daily.    Yes [provider]  insulin detemir (LEVEMIR) 100 UNIT/ML FlexPen Inject 20 Units into the skin See admin instructions. Sliding scale : Takes 15 units if blood sugar more than 300. Max 20 units. 10/04/19 10/03/20 Yes [provider]  lisinopril (PRINIVIL,ZESTRIL) 20 MG tablet Take 1 tablet (20 mg total) by mouth daily. 01/22/14 01/14/20 Yes Zanard, Hinton Dyer, MD  XIGDUO XR 06-999 MG TB24 Take 1 tablet by mouth daily. 08/24/19  Yes [provider]  glipiZIDE (GLUCOTROL XL) 10 MG 24 hr tablet Take 1 tablet (10 mg total) by mouth daily with breakfast. Patient not taking: Reported on 01/14/2020 04/01/14 01/14/20  Gillian Scarce, MD  omega-3 acid ethyl esters (LOVAZA) 1 G capsule Take 2 capsules (2 g total) by mouth 2 (two) times daily. Patient not taking: Reported on 01/14/2020 04/01/14 04/01/15  Gillian Scarce, MD  pioglitazone (ACTOS) 45 MG tablet Take 1 tablet (45 mg total) by mouth daily. Patient not taking: Reported on 01/14/2020 01/22/14 02/11/15  Gillian Scarce, MD  sitaGLIPtin-metformin (JANUMET) 50-1000 MG per tablet Take 1 tablet by mouth 2 (two) times daily with a meal. Patient not taking: Reported on 01/14/2020 01/22/14 01/22/15  Gillian Scarce, MD     Critical care time:   Ginette Otto, DO IM PGY-3

## 2020-01-14 NOTE — ED Notes (Signed)
Report provided to [facility] for transport

## 2020-01-14 NOTE — Progress Notes (Addendum)
Inpatient Diabetes Program Recommendations  AACE/ADA: New Consensus Statement on Inpatient Glycemic Control (2015)  Target Ranges:  Prepandial:   less than 140 mg/dL      Peak postprandial:   less than 180 mg/dL (1-2 hours)      Critically ill patients:  140 - 180 mg/dL   Lab Results  Component Value Date   GLUCAP 324 (H) 01/14/2020   HGBA1C 7.4 01/22/2014    Review of Glycemic Control  Diabetes history: DM 2 Outpatient Diabetes medications: conflicting insulin written in home regimen with both tresiba and Levemir, High dose Novolog once a day Current orders for Inpatient glycemic control:  Tresiba 36 units daily Levemir 15 units if glucose <300, 20 units iof glucose > 300 Novolog 75 units Daily Dapagliflozin-metformin 06-999 Daily Novolog 4 units tid meal coverage Novolog 0-15 units tid   Inpatient Diabetes Program Recommendations:    PT NPO. Consider Levemir and Novolog only at this time.  -  Consider Levemir 20 units Q24 hours.  -  Novolog 0-15 units Q4 hours while NPO.  -  D/c meal coverage at this time.   - D/c all oral meds at this time as oral medication not recommended for inpatient management.  Will follow glucose trends.  Discussed with Verlin Dike, NP over secure chat. Orders adjusted with read back co-signature. Noted Decadron 10 mg Q6 hours. Will need insulin titration tomorrow of insulin. Most likely need bid dosing of Levemir.  Thanks,  Christena Deem RN, MSN, BC-ADM Inpatient Diabetes Coordinator Team Pager [phone] (8a-5p)

## 2020-01-14 NOTE — ED Triage Notes (Signed)
Pt to ED via [facility] with c/o HA and neck pain, onset last night; reported to EMS that he did not take any of his morning meds, but did take an oxycontin that was rx'd to his significant other

## 2020-01-14 NOTE — ED Notes (Signed)
Pt very restless in bed

## 2020-01-14 NOTE — Progress Notes (Signed)
Notified bedside nurse of need to draw repeat lactic acid @ 1221.

## 2020-01-14 NOTE — H&P (Signed)
Nicholas [name] is an 69 y.o. male.   HPI:  69 year old male presented to the ED today for persistent headaches that have gradually worsened since yesterday evening. His wife is the main historian today since the patient is in so much pain. After dinner she states that he was complaining of a severe headache. He went to bed and woke up this morning "not feeling himself." He reports nausea and vomiting this morning. Wife called EMS who then brought him to [facility]. CTA was performed and patient was transferred to [facility]. Denies any dizziness. His wife does think he's a little confused. Does report a history of hypertension. Currently on cardene drip.   Past Medical History:  Diagnosis Date  . BPH (benign prostatic hypertrophy) 12/20/2011   Dr. Cleatrice Burke  . Colon polyp    Dr. Vernell Barrier, it was recommended that he have a follow-up colonoscopy in 2010.  Marland Kitchen Deviated septum   . Diabetes mellitus without complication [facility])    Eye exam - 2011  . Hepatitis A   . HSV-1 (herpes simplex virus 1) infection   . Hyperlipidemia   . Hypertension   . Migraine headache     Past Surgical History:  Procedure Laterality Date  . NASAL SEPTUM SURGERY      No Known Allergies  Social History   Tobacco Use  . Smoking status: Never Smoker  . Smokeless tobacco: Never Used  Substance Use Topics  . Alcohol use: Yes    Comment: He says that he only drinks occasionally.      Family History  Problem Relation Age of Onset  . Diabetes Mother   . Neuropathy Mother   . Diabetes Father   . Cancer Father        Lung Cancer  . Diabetes Sister   . Diabetes Brother      Review of Systems  Positive ROS: as above  All other systems have been reviewed and were otherwise negative with the exception of those mentioned in the HPI and as above.  Objective: Vital signs in last 24 hours: Temp:  [98.4 F (36.9 C)-99.1 F (37.3 C)] 98.4 F (36.9 C) (04/26 1345) Pulse Rate:  [105-136] 128 (04/26 1445) Resp:   [16-30] 23 (04/26 1445) BP: (124-219)/(46-109) 143/58 (04/26 1445) SpO2:  [90 %-99 %] 96 % (04/26 1445) Weight:  [90.7 kg] 90.7 kg (04/26 0936)  General Appearance: Alert, cooperative, no distress, appears stated age Head: Normocephalic, without obvious abnormality, atraumatic Eyes: PERRL, conjunctiva/corneas clear, EOM's intact, fundi benign, both eyes      Lungs:  respirations unlabored Heart: Regular rate and rhythm  NEUROLOGIC:   Mental status: A&O x4, no aphasia, good attention span, Memory and fund of knowledge Motor Exam - grossly normal, normal tone and bulk Sensory Exam - grossly normal Reflexes: symmetric, no pathologic reflexes, No Hoffman's, No clonus Coordination - grossly normal Gait - not tested Balance -not tested Cranial Nerves: I: smell Not tested  II: visual acuity  OS: na    OD: na  II: visual fields Full to confrontation  II: pupils Equal, round, reactive to light  III,VII: ptosis None  III,IV,VI: extraocular muscles  Full ROM  V: mastication Normal  V: facial light touch sensation  Normal  V,VII: corneal reflex  Present  VII: facial muscle function - upper  Normal  VII: facial muscle function - lower Normal  VIII: hearing Not tested  IX: soft palate elevation  Normal  IX,X: gag reflex Present  XI:  trapezius strength  5/5  XI: sternocleidomastoid strength 5/5  XI: neck flexion strength  5/5  XII: tongue strength  Normal    Data Review Lab Results  Component Value Date   WBC 14.6 (H) 01/14/2020   HGB 16.7 01/14/2020   HCT 49.0 01/14/2020   MCV 85.4 01/14/2020   PLT 232 01/14/2020   Lab Results  Component Value Date   NA 141 01/14/2020   K 3.9 01/14/2020   CL 101 01/14/2020   CO2 20 (L) 01/14/2020   BUN 27 (H) 01/14/2020   CREATININE 1.13 01/14/2020   GLUCOSE 343 (H) 01/14/2020   Lab Results  Component Value Date   INR 1.1 01/14/2020    Radiology: CT Angio Head W or Wo Contrast  Result Date: 01/14/2020 CLINICAL DATA:  Acute  headache.  Normal neuro exam. EXAM: CT ANGIOGRAPHY HEAD AND NECK TECHNIQUE: Multidetector CT imaging of the head and neck was performed using the standard protocol during bolus administration of intravenous contrast. Multiplanar CT image reconstructions and MIPs were obtained to evaluate the vascular anatomy. Carotid stenosis measurements (when applicable) are obtained utilizing NASCET criteria, using the distal internal carotid diameter as the denominator. CONTRAST:  143mL OMNIPAQUE IOHEXOL 350 MG/ML SOLN COMPARISON:  None. FINDINGS: CT HEAD FINDINGS Brain: Large volume acute subarachnoid hemorrhage. Large amount of subarachnoid blood is seen in the basilar cisterns and extending into the sylvian fissure bilaterally right greater than left. Interhemispheric subarachnoid hemorrhage is present. Hemorrhage along the right tentorium medially may represent subdural or subarachnoid hemorrhage. Generalized atrophy without hydrocephalus. No acute infarct or mass. Vascular: Limited vascular evaluation due to subarachnoid hemorrhage. Skull: Negative Sinuses: Negative Orbits: Negative Review of the MIP images confirms the above findings CTA NECK FINDINGS Aortic arch: Standard branching. Imaged portion shows no evidence of aneurysm or dissection. No significant stenosis of the major arch vessel origins. Mild atherosclerotic calcification aortic arch. Right carotid system: Atherosclerotic calcification right carotid bifurcation. Approximately 25% diameter stenosis right internal carotid artery. Left carotid system: Atherosclerotic calcification left carotid bifurcation and carotid bulb. 50% diameter stenosis proximal left internal carotid artery. Vertebral arteries: Both vertebral arteries widely patent to the basilar without significant stenosis. Skeleton: Mild degenerative changes cervical spine without acute skeletal abnormality. Other neck: Negative for mass or adenopathy in the neck. Upper chest: Negative Review of the MIP  images confirms the above findings CTA HEAD FINDINGS Anterior circulation: Atherosclerotic calcification in the cavernous carotid bilaterally with mild to moderate stenosis bilaterally. No aneurysm. Anterior and middle cerebral arteries patent bilaterally without large vessel occlusion. No aneurysm. Posterior circulation: Both vertebral arteries patent to the basilar. Basilar is small in caliber but patent. Superior cerebellar arteries patent bilaterally. Posterior cerebral arteries patent bilaterally. Fetal origin left posterior cerebral artery. No aneurysm in the posterior circulation. Venous sinuses: Normal venous enhancement. Anatomic variants: None Review of the MIP images confirms the above findings IMPRESSION: 1. Large volume subarachnoid hemorrhage, right greater than left. Possible right tentorial subdural hemorrhage versus subarachnoid hemorrhage. 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 Recommend catheter angiogram for further evaluation. 2. Negative for hydrocephalus.  No acute infarct. 3. Small caliber basilar artery could represent basal spasm. 4. 25% diameter stenosis proximal right internal carotid artery. 50% diameter stenosis proximal left internal carotid artery. Mild to moderate stenosis in the cavernous carotid bilaterally due to atherosclerotic calcification 5. These results were called by telephone at the time of interpretation on 01/14/2020 at 11:47 am to provider ABIGAIL HARRIS ,  who verbally acknowledged these results. Electronically Signed   By: Marlan Palau M.D.   On: 01/14/2020 11:49   CT Angio Neck W and/or Wo Contrast  Result Date: 01/14/2020 CLINICAL DATA:  Acute headache.  Normal neuro exam. EXAM: CT ANGIOGRAPHY HEAD AND NECK TECHNIQUE: Multidetector CT imaging of the head and neck was performed using the standard protocol during bolus administration of intravenous contrast. Multiplanar CT image reconstructions and MIPs  were obtained to evaluate the vascular anatomy. Carotid stenosis measurements (when applicable) are obtained utilizing NASCET criteria, using the distal internal carotid diameter as the denominator. CONTRAST:  OMNIPAQUE IOHEXOL 350 MG/ML SOLN COMPARISON:  None. FINDINGS: CT HEAD FINDINGS Brain: Large volume acute subarachnoid hemorrhage. Large amount of subarachnoid blood is seen in the basilar cisterns and extending into the sylvian fissure bilaterally right greater than left. Interhemispheric subarachnoid hemorrhage is present. Hemorrhage along the right tentorium medially may represent subdural or subarachnoid hemorrhage. Generalized atrophy without hydrocephalus. No acute infarct or mass. Vascular: Limited vascular evaluation due to subarachnoid hemorrhage. Skull: Negative Sinuses: Negative Orbits: Negative Review of the MIP images confirms the above findings CTA NECK FINDINGS Aortic arch: Standard branching. Imaged portion shows no evidence of aneurysm or dissection. No significant stenosis of the major arch vessel origins. Mild atherosclerotic calcification aortic arch. Right carotid system: Atherosclerotic calcification right carotid bifurcation. Approximately 25% diameter stenosis right internal carotid artery. Left carotid system: Atherosclerotic calcification left carotid bifurcation and carotid bulb. 50% diameter stenosis proximal left internal carotid artery. Vertebral arteries: Both vertebral arteries widely patent to the basilar without significant stenosis. Skeleton: Mild degenerative changes cervical spine without acute skeletal abnormality. Other neck: Negative for mass or adenopathy in the neck. Upper chest: Negative Review of the MIP images confirms the above findings CTA HEAD FINDINGS Anterior circulation: Atherosclerotic calcification in the cavernous carotid bilaterally with mild to moderate stenosis bilaterally. No aneurysm. Anterior and middle cerebral arteries patent bilaterally  without large vessel occlusion. No aneurysm. Posterior circulation: Both vertebral arteries patent to the basilar. Basilar is small in caliber but patent. Superior cerebellar arteries patent bilaterally. Posterior cerebral arteries patent bilaterally. Fetal origin left posterior cerebral artery. No aneurysm in the posterior circulation. Venous sinuses: Normal venous enhancement. Anatomic variants: None Review of the MIP images confirms the above findings IMPRESSION: 1. Large volume subarachnoid hemorrhage, right greater than left. Possible right tentorial subdural hemorrhage versus subarachnoid hemorrhage. 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 Recommend catheter angiogram for further evaluation. 2. Negative for hydrocephalus.  No acute infarct. 3. Small caliber basilar artery could represent basal spasm. 4. 25% diameter stenosis proximal right internal carotid artery. 50% diameter stenosis proximal left internal carotid artery. Mild to moderate stenosis in the cavernous carotid bilaterally due to atherosclerotic calcification 5. These results were called by telephone at the time of interpretation on 01/14/2020 at 11:47 am to provider ABIGAIL HARRIS , who verbally acknowledged these results. Electronically Signed   By: Marlan Palau M.D.   On: 01/14/2020 11:49   DG Chest Port 1 View  Result Date: 01/14/2020 CLINICAL DATA:  Altered mental status.  Headache. EXAM: PORTABLE CHEST 1 VIEW COMPARISON:  None. FINDINGS: There is slight left base atelectasis. Lungs elsewhere are clear. Heart is upper normal in size with pulmonary vascularity normal. No adenopathy. No bone lesions. IMPRESSION: Slight left base atelectasis. Lungs otherwise clear. Heart upper normal in size. Electronically Signed   By: Bretta Bang III M.D.   On: 01/14/2020 10:04  Assessment/Plan: 69 year old male presented to the ED today after worsening headaches. CTA shows a large SAH  in the basilar cisterns extending into bilateral sylvian fissures . SAH is most likelyy from aneurysm rupture however no aneurysm was visualized on CTA. Will plan to admit to ICU and have Dr. Conchita Paris perform a catheter angiogram. Discussed plan with his wife and she understood. Continue pain control and blood pressure management.   Tiana Loft Trysten Bernard 01/14/2020 3:13 PM

## 2020-01-14 NOTE — ED Provider Notes (Signed)
[facility] EMERGENCY DEPARTMENT Provider Note   CSN: [id number] Arrival date & time: 01/14/20  7510     History Chief Complaint  Patient presents with  . Headache  . Neck Pain    Nicholas [name] is a 69 y.o. male 69 year old male with a past medical history of migraine headaches, hypertension, hyperlipidemia, HSV 1, hepatitis A, diabetes and BPH who presents emergency department with altered mental status.  There is a level 5 caveat due to confusion.  History is gathered by EMS, patient at bedside and review of EMR.  According to EMS they were called out for the patient severe headache.  The patient has not had any of his daily medications today.  He apparently took his partners OxyContin medication this morning due to severe headache but it did not help.  Is confused about his time lightened but reports that yesterday he felt "his blood sugar going directly to his head."  He said this occurred just after eating pizza.  He took extra insulin which made him "feel funny."  He had an onset of a headache which reached peak intensity within 30 minutes.  He states that it is severe, he relates it to his neck predominantly.  He said that it is different from previous headaches.  He complains of body aches.  He denies photophobia or phonophobia.  He has gotten both of his Materna vaccinations for COVID-19.  His second vaccination was sometime last month.  The patient tells me that the year is 2021, that today is Saturday, and that the month is "still may." (It is currently Monday, January 14, 2020.)  Patient states that he does not know where he is.  HPI     Past Medical History:  Diagnosis Date  . BPH (benign prostatic hypertrophy) 12/20/2011   Dr. Cleatrice Burke  . Colon polyp    Dr. Vernell Barrier, it was recommended that he have a follow-up colonoscopy in 2010.  Marland Kitchen Deviated septum   . Diabetes mellitus without complication [facility])    Eye exam - 2011  . Hepatitis A   . HSV-1 (herpes simplex virus 1)  infection   . Hyperlipidemia   . Hypertension   . Migraine headache     Patient Active Problem List   Diagnosis Date Noted  . Other and unspecified hyperlipidemia 07/17/2013  . Need for prophylactic vaccination and inoculation against influenza 07/17/2013  . Type II or unspecified type diabetes mellitus without mention of complication, not stated as uncontrolled 04/12/2013  . 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 Elevated transaminase measurement 04/12/2013  . Type II or unspecified type diabetes mellitus without mention of complication, uncontrolled 01/05/2013  . Essential hypertension, benign 01/05/2013  . BPH (benign prostatic hyperplasia) 01/05/2013  . Overweight(278.02) 01/05/2013    Past Surgical History:  Procedure Laterality Date  . NASAL SEPTUM SURGERY         Family History  Problem Relation Age of Onset  . Diabetes Mother   . Neuropathy Mother   . Diabetes Father   . Cancer Father        Lung Cancer  . Diabetes Sister   . Diabetes Brother     Social History   Tobacco Use  . Smoking status: Never Smoker  . Smokeless tobacco: Never Used  Substance Use Topics  . Alcohol use: Yes    Comment: He says that he only drinks occasionally.    . Drug use: No    Home Medications Prior to Admission medications   Medication  Sig Start Date End Date Taking? Authorizing Provider  aspirin (ASPIRIN ADULT LOW STRENGTH) 81 MG EC tablet Take 81 mg by mouth daily. Swallow whole.    [provider]  atorvastatin (LIPITOR) 80 MG tablet Take 1 tablet (80 mg total) by mouth daily. 07/17/13 07/17/14  Zanard, Hinton Dyerobyn K, MD  CRESTOR 40 MG tablet Take 1 tablet (40 mg total) by mouth daily. 04/01/14 04/02/15  Gillian ScarceZanard, Robyn K, MD  fenofibrate 160 MG tablet Take 1 tablet (160 mg total) by mouth daily. 01/22/14 02/11/15  Gillian ScarceZanard, Robyn K, MD  glipiZIDE (GLUCOTROL XL) 10 MG 24 hr tablet Take 1 tablet (10 mg total) by mouth daily with breakfast. 04/01/14 04/01/15  Zanard, Hinton Dyerobyn K, MD  lisinopril  (PRINIVIL,ZESTRIL) 20 MG tablet Take 1 tablet (20 mg total) by mouth daily. 01/22/14 02/11/15  Gillian ScarceZanard, Robyn K, MD  omega-3 acid ethyl esters (LOVAZA) 1 G capsule Take 2 capsules (2 g total) by mouth 2 (two) times daily. 04/01/14 04/01/15  Gillian ScarceZanard, Robyn K, MD  pioglitazone (ACTOS) 45 MG tablet Take 1 tablet (45 mg total) by mouth daily. 01/22/14 02/11/15  Gillian ScarceZanard, Robyn K, MD  sitaGLIPtin-metformin (JANUMET) 50-1000 MG per tablet Take 1 tablet by mouth 2 (two) times daily with a meal. 01/22/14 01/22/15  Zanard, Hinton Dyerobyn K, MD    Allergies    Patient has no known allergies.  Review of Systems   Review of Systems Ten systems reviewed and are negative for acute change, except as noted in the HPI.   Physical Exam Updated Vital Signs BP (!) 219/106 (BP Location: Right Arm)   Pulse (!) 106   Temp 98.6 F (37 C) (Oral)   Resp 18   Ht 6' (1.829 m)   Wt 90.7 kg   SpO2 95%   BMI 27.12 kg/m   Physical Exam Vitals and nursing note reviewed.  Constitutional:      General: He is not in acute distress.    Appearance: He is well-developed. He is ill-appearing. He is not diaphoretic.  HENT:     Head: Normocephalic and atraumatic.  Eyes:     General: No scleral icterus.    Conjunctiva/sclera: Conjunctivae normal.     Pupils: Pupils are equal, round, and reactive to light.     Comments: No horizontal, vertical or rotational nystagmus  Neck:     Comments: Full active and passive ROM without pain No midline or paraspinal tenderness No nuchal rigidity or meningeal signs Cardiovascular:     Rate and Rhythm: Regular rhythm. Tachycardia present.     Heart sounds: Normal heart sounds.  Pulmonary:     Effort: Pulmonary effort is normal. No respiratory distress.     Breath sounds: Normal breath sounds. No wheezing or rales.  Abdominal:     General: Bowel sounds are normal.     Palpations: Abdomen is soft.     Tenderness: There is no abdominal tenderness. There is no guarding or rebound.  Musculoskeletal:          General: Normal range of motion.     Cervical back: Normal range of motion and neck supple.  Lymphadenopathy:     Cervical: No cervical adenopathy.  Skin:    General: Skin is warm and dry.     Findings: No rash.  Neurological:     Mental Status: He is alert. He is confused.     GCS: GCS eye subscore is 4. GCS verbal subscore is 5. GCS motor subscore is 6.     Cranial  Nerves: No cranial nerve deficit.     Motor: No abnormal muscle tone.     Coordination: Coordination normal.     Deep Tendon Reflexes: Reflexes are normal and symmetric.     Comments: Mental Status:  Alert, and confused. Speech fluent without evidence of aphasia. Able to follow 2 step commands without difficulty.  Cranial Nerves:  II:  Peripheral visual fields grossly normal, pupils equal, round, reactive to light III,IV, VI: ptosis not present, extra-ocular motions intact bilaterally  V,VII: smile symmetric, facial light touch sensation equal VIII: hearing grossly normal bilaterally  IX,X: midline uvula rise  XI: bilateral shoulder shrug equal and strong XII: midline tongue extension  Motor:  5/5 in upper and lower extremities bilaterally including strong and equal grip strength and dorsiflexion/plantar flexion Sensory: Pinprick and light touch normal in all extremities.  Deep Tendon Reflexes: 2+ and symmetric  Cerebellar: normal finger-to-nose with bilateral upper extremities Gait: Deferred CV: distal pulses bounding  Psychiatric:        Behavior: Behavior normal.        Thought Content: Thought content normal.        Judgment: Judgment normal.     ED Results / Procedures / Treatments   Labs (all labs ordered are listed, but only abnormal results are displayed) Labs Reviewed  COMPREHENSIVE METABOLIC PANEL - Abnormal; Notable for the following components:      Result Value   CO2 20 (*)    Glucose, Bld 343 (*)    BUN 27 (*)    Total Protein 8.8 (*)    Albumin 5.2 (*)    Total Bilirubin 1.3 (*)     Anion gap 18 (*)    All other components within normal limits  CBC WITH DIFFERENTIAL/PLATELET - Abnormal; Notable for the following components:   WBC 14.6 (*)    Neutro Abs 13.7 (*)    Lymphs Abs 0.4 (*)    All other components within normal limits  URINALYSIS, COMPLETE (UACMP) WITH MICROSCOPIC - Abnormal; Notable for the following components:   Glucose, UA >=500 (*)    Hgb urine dipstick TRACE (*)    Ketones, ur 15 (*)    Protein, ur 100 (*)    Bacteria, UA FEW (*)    All other components within normal limits  LACTIC ACID, PLASMA - Abnormal; Notable for the following components:   Lactic Acid, Venous 3.2 (*)    All other components within normal limits  LACTIC ACID, PLASMA - Abnormal; Notable for the following components:   Lactic Acid, Venous 2.7 (*)    All other components within normal limits  CBG MONITORING, ED - Abnormal; Notable for the following components:   Glucose-Capillary 324 (*)    All other components within normal limits  POCT I-STAT EG7 - Abnormal; Notable for the following components:   pH, Ven 7.442 (*)    pCO2, Ven 33.5 (*)    pO2, Ven 55.0 (*)    All other components within normal limits  RESPIRATORY PANEL BY RT PCR (FLU A&B, COVID)  CULTURE, BLOOD (ROUTINE X 2)  CULTURE, BLOOD (ROUTINE X 2)  URINE CULTURE  AMMONIA  APTT  PROTIME-INR  LACTIC ACID, PLASMA  I-STAT VENOUS BLOOD GAS, ED  CBG MONITORING, ED    EKG EKG Interpretation  Date/Time:  Monday January 14 2020 09:53:11 EDT Ventricular Rate:  105 PR Interval:    QRS Duration: 85 QT Interval:  349 QTC Calculation: 462 R Axis:   39 Text Interpretation: Sinus tachycardia Confirmed  by Kennis Carina [id number]) on 01/14/2020 10:50:26 AM   Radiology CT Angio Head W or Wo Contrast  Result Date: 01/14/2020 CLINICAL DATA:  Acute headache.  Normal neuro exam. EXAM: CT ANGIOGRAPHY HEAD AND NECK TECHNIQUE: Multidetector CT imaging of the head and neck was performed using the standard protocol during bolus  administration of intravenous contrast. Multiplanar CT image reconstructions and MIPs were obtained to evaluate the vascular anatomy. Carotid stenosis measurements (when applicable) are obtained utilizing NASCET criteria, using the distal internal carotid diameter as the denominator. CONTRAST:  OMNIPAQUE IOHEXOL 350 MG/ML SOLN COMPARISON:  None. FINDINGS: CT HEAD FINDINGS Brain: Large volume acute subarachnoid hemorrhage. Large amount of subarachnoid blood is seen in the basilar cisterns and extending into the sylvian fissure bilaterally right greater than left. Interhemispheric subarachnoid hemorrhage is present. Hemorrhage along the right tentorium medially may represent subdural or subarachnoid hemorrhage. Generalized atrophy without hydrocephalus. No acute infarct or mass. Vascular: Limited vascular evaluation due to subarachnoid hemorrhage. Skull: Negative Sinuses: Negative Orbits: Negative Review of the MIP images confirms the above findings CTA NECK FINDINGS Aortic arch: Standard branching. Imaged portion shows no evidence of aneurysm or dissection. No significant stenosis of the major arch vessel origins. Mild atherosclerotic calcification aortic arch. Right carotid system: Atherosclerotic calcification right carotid bifurcation. Approximately 25% diameter stenosis right internal carotid artery. Left carotid system: Atherosclerotic calcification left carotid bifurcation and carotid bulb. 50% diameter stenosis proximal left internal carotid artery. Vertebral arteries: Both vertebral arteries widely patent to the basilar without significant stenosis. Skeleton: Mild degenerative changes cervical spine without acute skeletal abnormality. Other neck: Negative for mass or adenopathy in the neck. Upper chest: Negative Review of the MIP images confirms the above findings CTA HEAD FINDINGS Anterior circulation: Atherosclerotic calcification in the cavernous carotid bilaterally with mild to moderate stenosis  bilaterally. No aneurysm. Anterior and middle cerebral arteries patent bilaterally without large vessel occlusion. No aneurysm. Posterior circulation: Both vertebral arteries patent to the basilar. Basilar is small in caliber but patent. Superior cerebellar arteries patent bilaterally. Posterior cerebral arteries patent bilaterally. Fetal origin left posterior cerebral artery. No aneurysm in the posterior circulation. Venous sinuses: Normal venous enhancement. Anatomic variants: None Review of the MIP images confirms the above findings IMPRESSION: 1. Large volume subarachnoid hemorrhage, right greater than left. Possible right tentorial subdural hemorrhage versus subarachnoid hemorrhage. 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 Recommend catheter angiogram for further evaluation. 2. Negative for hydrocephalus.  No acute infarct. 3. Small caliber basilar artery could represent basal spasm. 4. 25% diameter stenosis proximal right internal carotid artery. 50% diameter stenosis proximal left internal carotid artery. Mild to moderate stenosis in the cavernous carotid bilaterally due to atherosclerotic calcification 5. These results were called by telephone at the time of interpretation on 01/14/2020 at 11:47 am to provider Jowell Bossi , who verbally acknowledged these results. Electronically Signed   By: Marlan Palau M.D.   On: 01/14/2020 11:49   CT Angio Neck W and/or Wo Contrast  Result Date: 01/14/2020 CLINICAL DATA:  Acute headache.  Normal neuro exam. EXAM: CT ANGIOGRAPHY HEAD AND NECK TECHNIQUE: Multidetector CT imaging of the head and neck was performed using the standard protocol during bolus administration of intravenous contrast. Multiplanar CT image reconstructions and MIPs were obtained to evaluate the vascular anatomy. Carotid stenosis measurements (when applicable) are obtained utilizing NASCET criteria, using the distal internal carotid  diameter as the denominator. CONTRAST:  OMNIPAQUE IOHEXOL 350 MG/ML SOLN COMPARISON:  None. FINDINGS: CT HEAD FINDINGS Brain: Large volume acute subarachnoid hemorrhage. Large amount of subarachnoid blood is seen in the basilar cisterns and extending into the sylvian fissure bilaterally right greater than left. Interhemispheric subarachnoid hemorrhage is present. Hemorrhage along the right tentorium medially may represent subdural or subarachnoid hemorrhage. Generalized atrophy without hydrocephalus. No acute infarct or mass. Vascular: Limited vascular evaluation due to subarachnoid hemorrhage. Skull: Negative Sinuses: Negative Orbits: Negative Review of the MIP images confirms the above findings CTA NECK FINDINGS Aortic arch: Standard branching. Imaged portion shows no evidence of aneurysm or dissection. No significant stenosis of the major arch vessel origins. Mild atherosclerotic calcification aortic arch. Right carotid system: Atherosclerotic calcification right carotid bifurcation. Approximately 25% diameter stenosis right internal carotid artery. Left carotid system: Atherosclerotic calcification left carotid bifurcation and carotid bulb. 50% diameter stenosis proximal left internal carotid artery. Vertebral arteries: Both vertebral arteries widely patent to the basilar without significant stenosis. Skeleton: Mild degenerative changes cervical spine without acute skeletal abnormality. Other neck: Negative for mass or adenopathy in the neck. Upper chest: Negative Review of the MIP images confirms the above findings CTA HEAD FINDINGS Anterior circulation: Atherosclerotic calcification in the cavernous carotid bilaterally with mild to moderate stenosis bilaterally. No aneurysm. Anterior and middle cerebral arteries patent bilaterally without large vessel occlusion. No aneurysm. Posterior circulation: Both vertebral arteries patent to the basilar. Basilar is small in caliber but patent. Superior cerebellar  arteries patent bilaterally. Posterior cerebral arteries patent bilaterally. Fetal origin left posterior cerebral artery. No aneurysm in the posterior circulation. Venous sinuses: Normal venous enhancement. Anatomic variants: None Review of the MIP images confirms the above findings IMPRESSION: 1. Large volume subarachnoid hemorrhage, right greater than left. Possible right tentorial subdural hemorrhage versus subarachnoid hemorrhage. 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 Recommend catheter angiogram for further evaluation. 2. Negative for hydrocephalus.  No acute infarct. 3. Small caliber basilar artery could represent basal spasm. 4. 25% diameter stenosis proximal right internal carotid artery. 50% diameter stenosis proximal left internal carotid artery. Mild to moderate stenosis in the cavernous carotid bilaterally due to atherosclerotic calcification 5. These results were called by telephone at the time of interpretation on 01/14/2020 at 11:47 am to provider Nimsi Males , who verbally acknowledged these results. Electronically Signed   By: Franchot Gallo M.D.   On: 01/14/2020 11:49   DG Chest Port 1 View  Result Date: 01/14/2020 CLINICAL DATA:  Altered mental status.  Headache. EXAM: PORTABLE CHEST 1 VIEW COMPARISON:  None. FINDINGS: There is slight left base atelectasis. Lungs elsewhere are clear. Heart is upper normal in size with pulmonary vascularity normal. No adenopathy. No bone lesions. IMPRESSION: Slight left base atelectasis. Lungs otherwise clear. Heart upper normal in size. Electronically Signed   By: Lowella Grip III M.D.   On: 01/14/2020 10:04    Procedures .Critical Care Performed by: Margarita Mail, PA-C Authorized by: Margarita Mail, PA-C   Critical care provider statement:    Critical care time (minutes):  75   Critical care time was exclusive of:  Separately billable procedures and treating other patients    Critical care was necessary to treat or prevent imminent or life-threatening deterioration of the following conditions:  CNS failure or compromise   Critical care was time spent personally by me on the following activities:  Discussions with consultants, evaluation of patient's response to treatment, examination of patient, ordering and performing treatments and interventions, ordering and review of laboratory studies, ordering  and review of radiographic studies, pulse oximetry, re-evaluation of patient's condition, obtaining history from patient or surrogate, review of old charts and interpretation of cardiac output measurements   (including critical care time)  Medications Ordered in ED Medications - No data to display  ED Course  I have reviewed the triage vital signs and the nursing notes.  Pertinent labs & imaging results that were available during my care of the patient were reviewed by me and considered in my medical decision making (see chart for details).  Clinical Course as of Jan 14 1303  Mon Jan 14, 2020  1049 WBC(!): 14.6 [AH]  1151 Patient CT scan concerning for a ruptured aneurysm/subarachnoid hemorrhage.  I have started a Cardene drip for aggressive blood pressure management with a goal of a systolic pressure between 140 and 160.  I discussed the reading with Dr. Chestine Spore of radiology.  Currently awaiting callback from neurosurgery.  I updated the patient who states that his headache is better and asks me to call his wife.  I also discussed the case with his wife.  She tells me that last night his headache came on around 64.  She states that he had severe headache and has never had anything like that before.  He had severe difficulty walking had multiple episodes of vomiting.  This morning when he awoke he was still confused and off of his baseline.  She gave him one of her pain medications prior to arrival but it did not help.   [AH]  1228 I spoke with Dr. Wynetta Emery Of neurosurgery- Will  do an ER-ER transfer (discussed with Dr. Madilyn Hook). Plan for IR neuro-intervention. Patient's wife at bedside and updated.   [AH]    Clinical Course User Index [AH] Arthor Captain, PA-C   MDM Rules/Calculators/A&P                      CC:AMS/HA VS: Notably hypetensive and tachycardic TI:[id number] is gathered by patient  and wife. Previous records obtained and reviewed. DDX:The patient's complaint of headache/Ams involves an extensive number of diagnostic and treatment options, and is a complaint that carries with it a high risk of complications, morbidity, and potential mortality. Given the large differential diagnosis, medical decision making is of high complexity. Emergent considerations for headache include subarachnoid hemorrhage, meningitis, temporal arteritis, glaucoma, cerebral ischemia, carotid/vertebral dissection, intracranial tumor, Venous sinus thrombosis, carbon monoxide poisoning, acute or chronic subdural hemorrhage.  Other considerations include: Migraine, Cluster headache, Hypertension, Caffeine, alcohol, or drug withdrawal, Pseudotumor cerebri, Arteriovenous malformation, Head injury, Neurocysticercosis, Post-lumbar puncture, Preeclampsia, Tension headache, Sinusitis, Cervical arthritis, Refractive error causing strain, Dental abscess, Otitis media, Temporomandibular joint syndrome, Depression, Somatoform disorder (eg, somatization) Trigeminal neuralgia, Glossopharyngeal neuralgia. Labs: I ordered reviewed and interpreted labs which include blood gap slightly alkalotic pH likely due to hyperventilation.  CBG shows a glucose of 324.  CMP shows some dehydration, low bicarb.  Anion gap likely secondary to the patient's lactic acidosis which is elevated and potentially due to high dehydration from his vomiting last night.  Other considerations for potential sepsis patient was treated as such.  Patient also has a leukocytosis supporting that diagnosis however after returning patient's imaging I  think this is likely more related to acute phase reaction and dehydration.  Patient's ammonia level within normal limits.  Blood cultures pending.  Covid test is negative. Imaging: I ordered and reviewed images which included 1 view chest x-ray, CT angiogram of the head and neck. I independently visualized and  interpreted all imaging. Significant findings include acute, large volume intracranial hemorrhage suggestive of aneurysmal pattern. There are no acute, significant findings on  the patient's 1 view chest x-ray. EKG: Sinus tachycardia at a rate of 105 Consults: Spoke with Dr. Chestine Spore of radiology and Dr. Wynetta Emery of neurosurgery MDM: Patient here with severe headache sudden onset yesterday.  Differential diagnosis at first concerning for altered mental status included the above differential diagnosis.  Patient initially treated as a sepsis as he fit protocol however I do not think he has sepsis as the cause of his reaction.  I think this is very likely dehydration related from vomiting last night.  He has an obvious intracranial hemorrhage.  Patient will be sent to [facility] ER with plan to go to the IR suite for intervention.  I have updated his wife who is at bedside. Patient disposition: Admit The patient appears reasonably stabilized for admission considering the current resources, flow, and capabilities available in the ED at this time, and I doubt any other [facility] requiring further screening and/or treatment in the ED prior to admission.        Final Clinical Impression(s) / ED Diagnoses Final diagnoses:  Subarachnoid hemorrhage [facility])    Rx / DC Orders ED Discharge Orders    None       Arthor Captain, PA-C 01/14/20 1522    Sabas Sous, MD 01/16/20 [phone]

## 2020-01-14 NOTE — ED Provider Notes (Signed)
  Physical Exam  BP (!) 124/46   Pulse (!) 120   Temp 98.4 F (36.9 C) (Oral)   Resp 17   Ht 6' (1.829 m)   Wt 90.7 kg   SpO2 99%   BMI 27.12 kg/m   Physical Exam  ED Course/Procedures   Clinical Course as of Jan 14 1444  Mon Jan 14, 2020  1049 WBC(!): 14.6 [AH]  1151 Patient CT scan concerning for a ruptured aneurysm/subarachnoid hemorrhage.  I have started a Cardene drip for aggressive blood pressure management with a goal of a systolic pressure between 140 and 160.  I discussed the reading with Dr. Chestine Spore of radiology.  Currently awaiting callback from neurosurgery.  I updated the patient who states that his headache is better and asks me to call his wife.  I also discussed the case with his wife.  She tells me that last night his headache came on around 59.  She states that he had severe headache and has never had anything like that before.  He had severe difficulty walking had multiple episodes of vomiting.  This morning when he awoke he was still confused and off of his baseline.  She gave him one of her pain medications prior to arrival but it did not help.   [AH]  1228 I spoke with Dr. Wynetta Emery Of neurosurgery- Will do an ER-ER transfer (discussed with Dr. Madilyn Hook). Plan for IR neuro-intervention. Patient's wife at bedside and updated.   [AH]    Clinical Course User Index [AH] Arthor Captain, PA-C    Procedures  MDM  Transfer from [facility].  Subarachnoid hemorrhage.  No clear aneurysm.  Had reported been taken to interventional radiology but sent to the ER.  Will discuss with neurosurgery about admission  Discussed with Dr. Wynetta Emery.  Pending for [facility] ICU bed.  Admission order put in.  He is in the OR but a staff will see in the ER      Benjiman Core, MD 01/14/20 1445

## 2020-01-15 ENCOUNTER — Inpatient Hospital Stay (HOSPITAL_COMMUNITY): Payer: BC Managed Care – PPO

## 2020-01-15 ENCOUNTER — Inpatient Hospital Stay (HOSPITAL_COMMUNITY): Payer: BC Managed Care – PPO | Admitting: Certified Registered Nurse Anesthetist

## 2020-01-15 ENCOUNTER — Other Ambulatory Visit (HOSPITAL_COMMUNITY): Payer: Self-pay

## 2020-01-15 ENCOUNTER — Encounter (HOSPITAL_COMMUNITY)
Admission: EM | Disposition: A | Discharge status: Rehabilitation Facility | Payer: Self-pay | Source: Home / Self Care | Attending: Neurosurgery

## 2020-01-15 DIAGNOSIS — R011 Cardiac murmur, unspecified: Secondary | ICD-10-CM

## 2020-01-15 DIAGNOSIS — I639 Cerebral infarction, unspecified: Secondary | ICD-10-CM | POA: Diagnosis not present

## 2020-01-15 DIAGNOSIS — I609 Nontraumatic subarachnoid hemorrhage, unspecified: Secondary | ICD-10-CM

## 2020-01-15 HISTORY — PX: IR ANGIO VERTEBRAL SEL VERTEBRAL BILAT MOD SED: IMG5369

## 2020-01-15 HISTORY — PX: IR ANGIO INTRA EXTRACRAN SEL INTERNAL CAROTID BILAT MOD SED: IMG5363

## 2020-01-15 HISTORY — PX: RADIOLOGY WITH ANESTHESIA: SHX6223

## 2020-01-15 HISTORY — PX: IR ANGIO EXTERNAL CAROTID SEL EXT CAROTID BILAT MOD SED: IMG5372

## 2020-01-15 LAB — BASIC METABOLIC PANEL
Anion gap: 10 (ref 5–15)
BUN: 35 mg/dL — ABNORMAL HIGH (ref 8–23)
CO2: 23 mmol/L (ref 22–32)
Calcium: 9.5 mg/dL (ref 8.9–10.3)
Chloride: 112 mmol/L — ABNORMAL HIGH (ref 98–111)
Creatinine, Ser: 1.18 mg/dL (ref 0.61–1.24)
GFR calc Af Amer: >60 mL/min (ref >60)
GFR calc non Af Amer: >60 mL/min (ref >60)
Glucose, Bld: 238 mg/dL — ABNORMAL HIGH (ref 70–99)
Potassium: 3.8 mmol/L (ref 3.5–5.1)
Sodium: 145 mmol/L (ref 135–145)

## 2020-01-15 LAB — CBC
HCT: 45.6 % (ref 39.0–52.0)
Hemoglobin: 15.2 g/dL (ref 13.0–17.0)
MCH: 29.9 pg (ref 26.0–34.0)
MCHC: 33.3 g/dL (ref 30.0–36.0)
MCV: 89.6 fL (ref 80.0–100.0)
Platelets: 256 10*3/uL (ref 150–400)
RBC: 5.09 MIL/uL (ref 4.22–5.81)
RDW: 13.4 % (ref 11.5–15.5)
WBC: 12.1 10*3/uL — ABNORMAL HIGH (ref 4.0–10.5)
nRBC: 0 % (ref 0.0–0.2)

## 2020-01-15 LAB — GLUCOSE, CAPILLARY
Glucose-Capillary: 337 mg/dL — ABNORMAL HIGH (ref 70–99)
Glucose-Capillary: 223 mg/dL — ABNORMAL HIGH (ref 70–99)
Glucose-Capillary: 287 mg/dL — ABNORMAL HIGH (ref 70–99)
Glucose-Capillary: 324 mg/dL — ABNORMAL HIGH (ref 70–99)
Glucose-Capillary: 303 mg/dL — ABNORMAL HIGH (ref 70–99)
Glucose-Capillary: 277 mg/dL — ABNORMAL HIGH (ref 70–99)
Glucose-Capillary: 353 mg/dL — ABNORMAL HIGH (ref 70–99)
Glucose-Capillary: 314 mg/dL — ABNORMAL HIGH (ref 70–99)

## 2020-01-15 LAB — ECHOCARDIOGRAM COMPLETE
Height: 72 in
Weight: 3200 oz

## 2020-01-15 LAB — URINE CULTURE: Culture: NO GROWTH

## 2020-01-15 LAB — SURGICAL PCR SCREEN
MRSA, PCR: NEGATIVE
Staphylococcus aureus: NEGATIVE

## 2020-01-15 LAB — PHOSPHORUS: Phosphorus: 1.4 mg/dL — ABNORMAL LOW (ref 2.5–4.6)

## 2020-01-15 LAB — MAGNESIUM: Magnesium: 2.2 mg/dL (ref 1.7–2.4)

## 2020-01-15 IMAGING — XA IR CAROTID INTERNAL HEAD/NECK BILAT  (MS)
10 of 13 series · 12 of 24 positions shown · IV contrast (IODINE)
Comparison: none

PROCEDURE:
DIAGNOSTIC CEREBRAL ANGIOGRAM
HISTORY: The patient is a 68-year-old man presenting to the hospital with
sudden onset of severe neck pain headache without other focal
neurologic deficit. CT scan demonstrated diffuse basal subarachnoid
hemorrhage while CT angiogram was negative. He therefore presents
today for further workup with diagnostic cerebral angiogram.
TECHNIQUE: CATHETERS AND WIRES
5-French JB-1 catheter

[Series 1: cerebral care 2 · 2 acquisitions, 1 frame shown (1 of 9)]
[im 1/2]
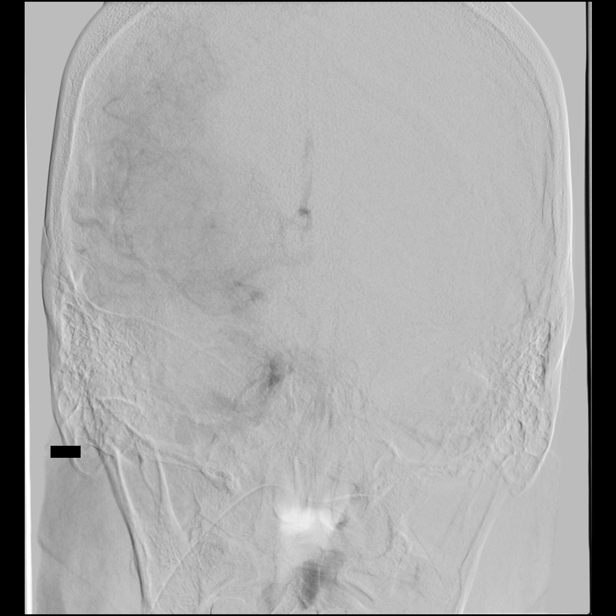

[Series 2: cerebral care 2 · 2 acquisitions, 1 frame shown (2 of 9)]
[im 1/2]
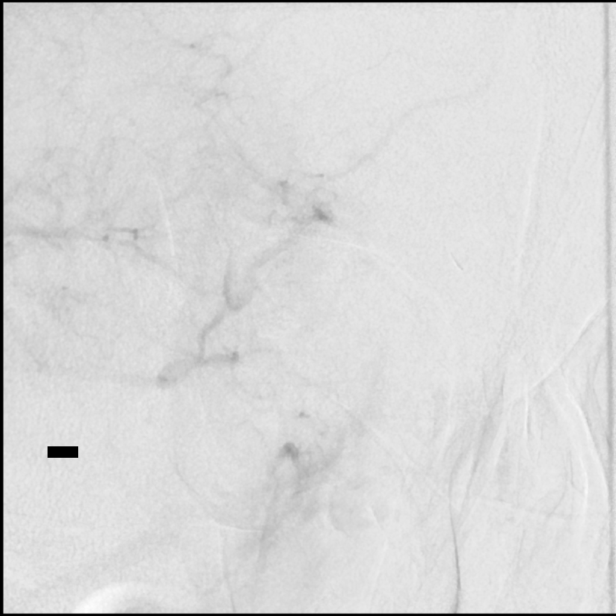

[Series 4: cerebral care 2 · 2 acquisitions, 1 frame shown (3 of 9)]
[im 1/2]
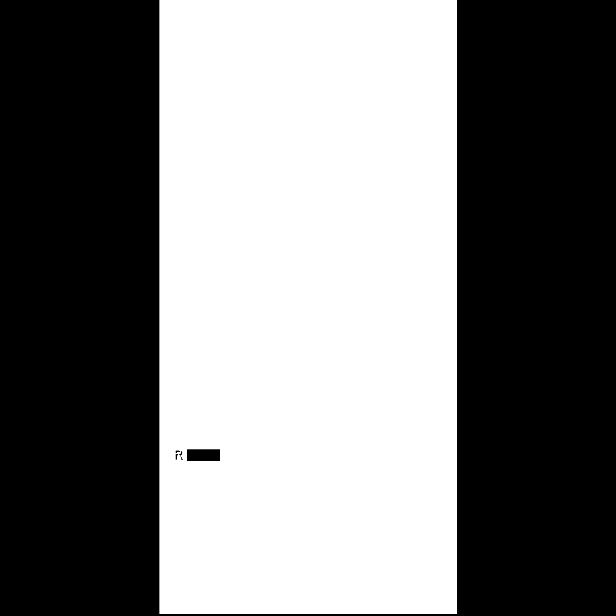

[Series 5: cerebral care 2 · 2 acquisitions, 1 frame shown (4 of 9)]
[im 1/2]
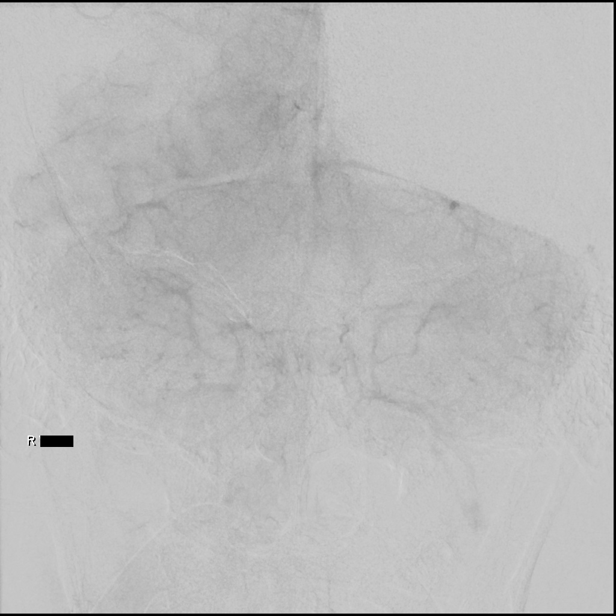

[Series 6: cerebral care 2 · 2 acquisitions, 1 frame shown (5 of 9)]
[im 1/2]
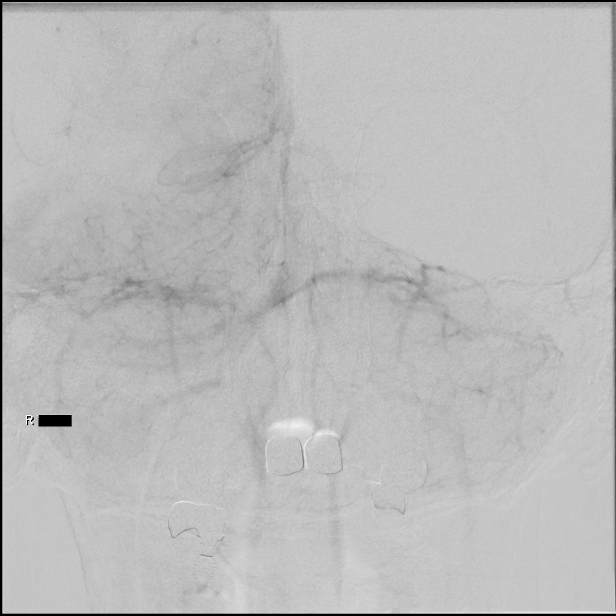

[Series 8: cerebral care 2 · 2 acquisitions, 1 frame shown (6 of 9)]
[im 1/2]
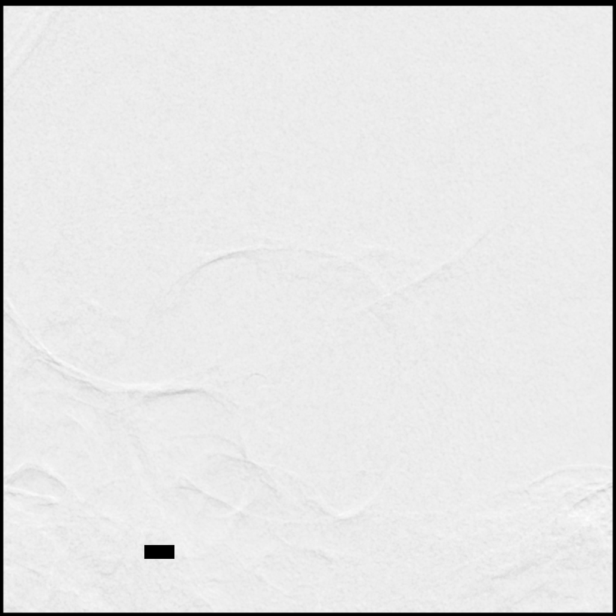

[Series 9: cerebral care 2 · 2 acquisitions, 1 frame shown (7 of 9)]
[im 1/2]
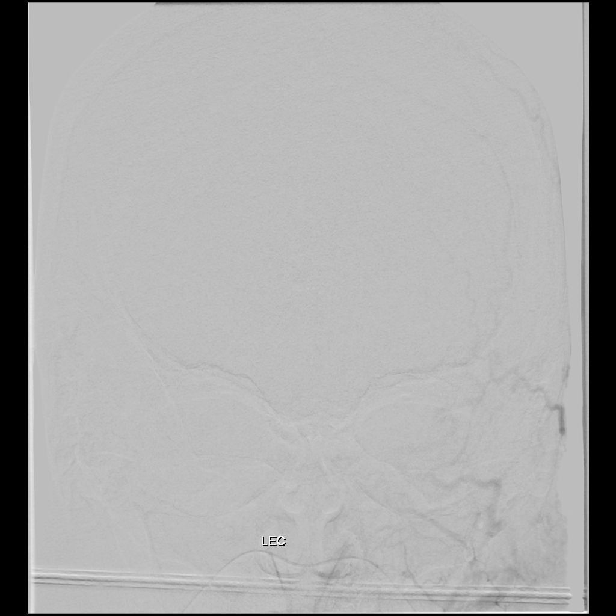

[Series 10: cerebral care 2 · 2 acquisitions, 1 frame shown (8 of 9)]
[im 1/2]
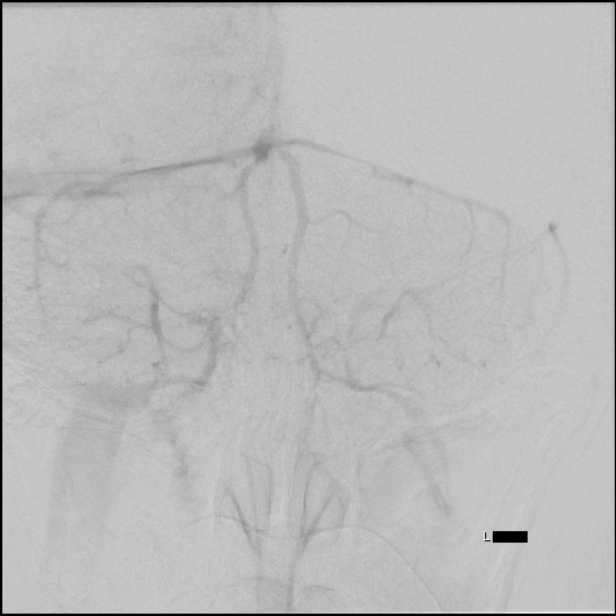

[Series 11: cerebral care 2 · 2 acquisitions, 1 frame shown (9 of 9)]
[im 1/2]
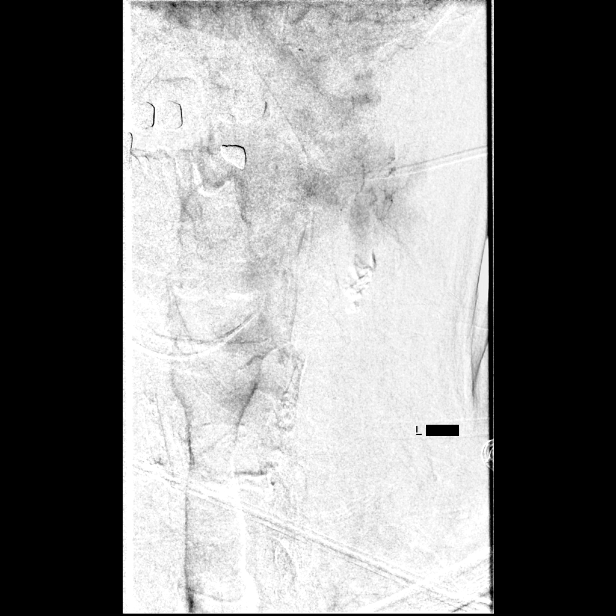

[Series 300: dr. (person_name) · 3 of 27 slices shown]
[im 3/27]
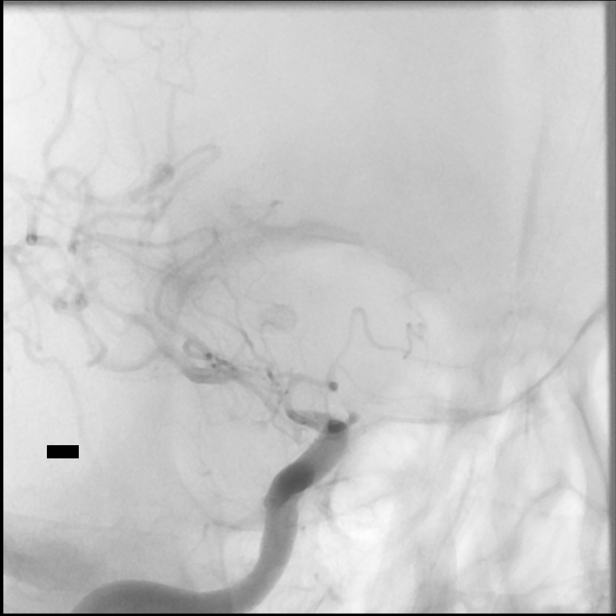
[im 15/27]
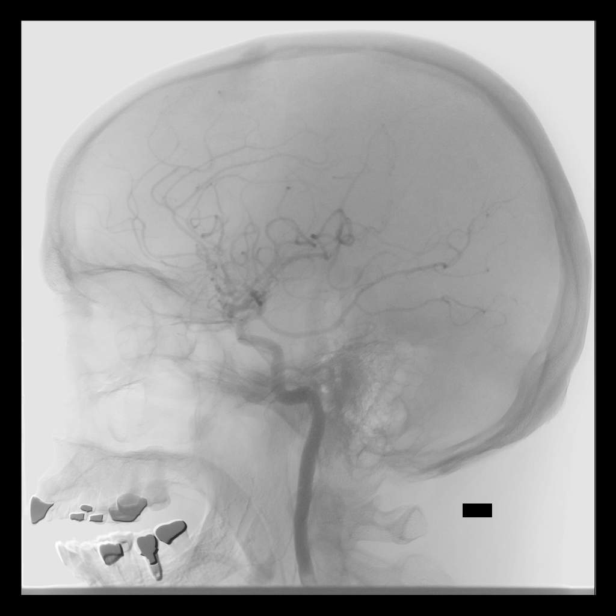
[im 27/27]
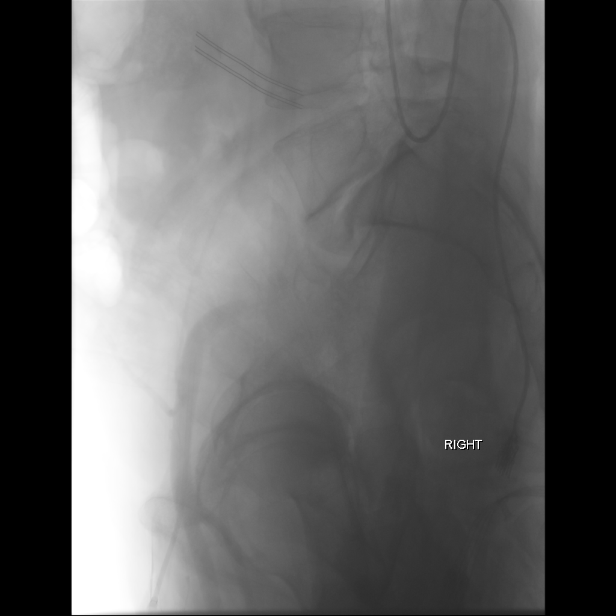

[12 of 24 positions shown; findings below may reference images not displayed]

ACCESS:
The technical aspects of the procedure as well as its potential
risks and benefits were reviewed with the patient. These risks
included but were not limited bleeding, infection, allergic
reaction, damage to organs or vital structures, stroke,
non-diagnostic procedure, and the catastrophic outcomes of heart
attack, coma, and death. With an understanding of these risks,
informed consent was obtained and witnessed. The patient was placed
in the supine position on the angiography table and the skin of
right groin prepped in the usual sterile fashion.

The procedure was performed under local anesthesia (1%-solution of
bicarbonate-buffered Lidocaine) and conscious sedation administered
by the anesthesia service.

A 5- French sheath was introduced in the right common femoral artery
using Seldinger technique. A fluoro-phase sequence was used to
document the sheath position.

MEDICATIONS:
HEPARIN: 0 Units total.

CONTRAST:  cc, Omnipaque 300

FLUOROSCOPY TIME:  FLUOROSCOPY TIME: See IR records
0.035" glidewire

VESSELS CATHETERIZED
Right internal carotid

Right external carotid

Left internal carotid

Left external carotid

Left vertebral

Right vertebral

Right common femoral

VESSELS STUDIED
Right internal carotid, head

Right external carotid head

Left internal carotid, head

Left external carotid, head

Left vertebral

Right vertebral

Right femoral

PROCEDURAL NARRATIVE
A 5-Fr JB-1 catheter was advanced over a 0.035 glidewire into the
aortic arch. The above vessels were then sequentially catheterized
and cervical / cerebral angiograms taken. After review of images,
the catheter was removed without incident.
FINDINGS: Right internal carotid, head:

Injection reveals the presence of a widely patent ICA, M1, and A1
segments and their branches. The distal supraclinoid segment the
right internal carotid artery is significantly tapered suggesting
significant vasospasm with the ICA terminus measuring 1.3 mm in
maximal dimension. There is also significant narrowing of the
proximal portions of the right M1. The anterior cerebral artery on
the right and its distal territory is not visualized on this right
carotid injection. There is significant washout of the right middle
cerebral artery territory due to sluggish flow and likely collateral
flow from the contralateral internal carotid. No aneurysms are
identified however the right posterior communicating artery is not
well visualized due to spasm. The venous sinuses are widely patent.

Right external carotid, head:

The visualized cranial branches of the right external carotid artery
are unremarkable. There is no early intracranial venous drainage
identified.

Left internal carotid, head:

Injection reveals the presence of a widely patent ICA, A1, and M1
segments and their branches. No aneurysms, arteriovenous
malformations, or high-flow fistulas are identified. There is good
flow through the anterior communicating artery with visualization of
the contralateral A1 and contralateral middle cerebral artery
territory. The parenchymal and venous phases are normal. The venous
sinuses are widely patent.

Left external carotid, head:

The visualized cranial branches of the left external carotid artery
are unremarkable. Early venous drainage is seen

Right vertebral:

Injection reveals the presence of a widely patent vertebral artery.
No luminal regularities or pseudoaneurysm/dissection is seen in the
cervical segments of the right vertebral artery. This leads to a
widely patent basilar artery that terminates in right P1. The left
P1 is hypoplastic and not visualized. The basilar apex is normal. No
aneurysms AVMs, or fistulas are seen. The parenchymal and venous
phases are normal. The venous sinuses are widely patent.

Left vertebral:

The vertebral artery is widely patent. No cervical dissections or
pseudoaneurysms are seen. No PICA aneurysm is seen. See basilar
description above.

Right femoral:

Normal vessel. No significant atherosclerotic disease. Arterial
sheath in adequate position.

DISPOSITION:
Upon completion of the study, the femoral sheath was removed and
hemostasis obtained using a 5-Fr ExoSeal closure device. Good
proximal and distal lower extremity pulses were documented upon
achievement of hemostasis. The procedure was well tolerated and no
early complications were observed. The patient was transferred to
the intensive care unit to lay flat for 2 hours.
IMPRESSION: 1. Significant vasospasm involving the distal supraclinoid right
internal carotid artery and proximal right M1. This is the cause of
significant flow limitation with good collateral flow from the
contralateral carotid through the anterior communicating artery.

2. No aneurysms, AVMs, or high-flow fistulas are identified,
although the right posterior communicating artery region is poorly
visualized due to the above described vasospasm.

The preliminary results of this procedure were shared with the
patient and the patient's family.

## 2020-01-15 SURGERY — IR WITH ANESTHESIA
Anesthesia: Monitor Anesthesia Care

## 2020-01-15 MED ORDER — LIDOCAINE HCL 1 % IJ SOLN
INTRAMUSCULAR | Status: AC
  Filled 2020-01-15: qty 20

## 2020-01-15 MED ORDER — FENTANYL CITRATE (PF) 100 MCG/2ML IJ SOLN
25.0000 ug | INTRAMUSCULAR | Status: DC | PRN
  Administered 2020-01-15: 50 ug via INTRAVENOUS

## 2020-01-15 MED ORDER — PNEUMOCOCCAL VAC POLYVALENT 25 MCG/0.5ML IJ INJ
0.5000 mL | INJECTION | INTRAMUSCULAR | Status: AC
  Administered 2020-01-17: 0.5 mL via INTRAMUSCULAR
  Filled 2020-01-15: qty 0.5

## 2020-01-15 MED ORDER — CLEVIDIPINE BUTYRATE 0.5 MG/ML IV EMUL
INTRAVENOUS | Status: AC
  Filled 2020-01-15: qty 50

## 2020-01-15 MED ORDER — POTASSIUM PHOSPHATES 15 MMOLE/5ML IV SOLN
30.0000 mmol | Freq: Once | INTRAVENOUS | Status: AC
  Administered 2020-01-15: 30 mmol via INTRAVENOUS
  Filled 2020-01-15: qty 10

## 2020-01-15 MED ORDER — PROMETHAZINE HCL 25 MG/ML IJ SOLN: 6.2500 mg | INTRAMUSCULAR | Status: DC | PRN

## 2020-01-15 MED ORDER — FENTANYL CITRATE (PF) 100 MCG/2ML IJ SOLN
INTRAMUSCULAR | Status: AC
  Filled 2020-01-15: qty 2

## 2020-01-15 MED ORDER — INSULIN DETEMIR 100 UNIT/ML ~~LOC~~ SOLN
50.0000 [IU] | SUBCUTANEOUS | Status: DC
  Administered 2020-01-15 – 2020-01-17 (×3): 50 [IU] via SUBCUTANEOUS
  Filled 2020-01-15 (×4): qty 0.5

## 2020-01-15 MED ORDER — LIDOCAINE HCL 1 % IJ SOLN
INTRAMUSCULAR | Status: AC | PRN
  Administered 2020-01-15: 10 mL

## 2020-01-15 MED ORDER — ONDANSETRON HCL 4 MG/2ML IJ SOLN
4.0000 mg | Freq: Four times a day (QID) | INTRAMUSCULAR | Status: DC | PRN
  Administered 2020-01-16 – 2020-01-17 (×4): 4 mg via INTRAVENOUS
  Filled 2020-01-15 (×4): qty 2

## 2020-01-15 MED ORDER — MEPERIDINE HCL 25 MG/ML IJ SOLN: 6.2500 mg | INTRAMUSCULAR | Status: DC | PRN

## 2020-01-15 MED ORDER — PROPOFOL 500 MG/50ML IV EMUL
INTRAVENOUS | Status: DC | PRN
  Administered 2020-01-15: 25 ug/kg/min via INTRAVENOUS

## 2020-01-15 MED ORDER — IOHEXOL 300 MG/ML  SOLN
150.0000 mL | Freq: Once | INTRAMUSCULAR | Status: AC | PRN
  Administered 2020-01-15: 58 mL via INTRA_ARTERIAL

## 2020-01-15 MED ORDER — HYDRALAZINE HCL 20 MG/ML IJ SOLN
10.0000 mg | Freq: Four times a day (QID) | INTRAMUSCULAR | Status: DC | PRN
  Administered 2020-01-15 – 2020-01-18 (×5): 10 mg via INTRAVENOUS
  Filled 2020-01-15 (×6): qty 1

## 2020-01-15 NOTE — Brief Op Note (Signed)
  NEUROSURGERY BRIEF OPERATIVE  NOTE   PREOP DX: Subarachnoid Hemorrhage  POSTOP DX: Same  PROCEDURE: Diagnostic cerebral angiogram  SURGEON: Dr. Lisbeth Renshaw, MD  ANESTHESIA: IV Sedation with Local (with anesthesia)  EBL: Minimal  SPECIMENS: None  COMPLICATIONS: None  CONDITION: Stable to recovery  FINDINGS (Full report in [facility]): 1. Significant focal vasospasm involving the distal supraclinoid right ICA and proximal right M1 and A1 with notable flow limitation through the right carotid. There is good collateral flow from the contralateral ICA through the Acom. 2. No aneurysms, AVM, or fistula is identified however the right posterior communicating artery is not well visualized due to the above vasospasm. Attention on follow-up is warranted. '

## 2020-01-15 NOTE — Anesthesia Postprocedure Evaluation (Signed)
Anesthesia Post Note  Patient: Nicholas [name]  Procedure(s) Performed: IR WITH ANESTHESIA (N/A )     Patient location during evaluation: PACU Anesthesia Type: MAC Level of consciousness: awake and alert Pain management: pain level controlled Vital Signs Assessment: post-procedure vital signs reviewed and stable Respiratory status: spontaneous breathing, nonlabored ventilation, respiratory function stable and patient connected to nasal cannula oxygen Cardiovascular status: stable and blood pressure returned to baseline Postop Assessment: no apparent nausea or vomiting Anesthetic complications: no    Last Vitals:  Vitals:   01/15/20 1530 01/15/20 1541  BP:  136/70  Pulse: (!) 103 97  Resp: 14 15  Temp:  36.6 C  SpO2: 94% 95%    Last Pain:  Vitals:   01/15/20 1541  TempSrc:   PainSc: 4                  Tiajuana Amass

## 2020-01-15 NOTE — Transfer of Care (Signed)
Immediate Anesthesia Transfer of Care Note  Patient: Nicholas [name]  Procedure(s) Performed: IR WITH ANESTHESIA (N/A )  Patient Location: PACU  Anesthesia Type:MAC  Level of Consciousness: awake, alert  and oriented  Airway & Oxygen Therapy: Patient connected to nasal cannula oxygen  Post-op Assessment: Report given to RN and Post -op Vital signs reviewed and stable  Post vital signs: Reviewed and stable  Last Vitals:  Vitals Value Taken Time  BP 150/71 01/15/20 1509  Temp    Pulse 103 01/15/20 1515  Resp 16 01/15/20 1515  SpO2 92 % 01/15/20 1515  Vitals shown include unvalidated device data.  Last Pain:  Vitals:   01/15/20 1200  TempSrc:   PainSc: 2          Complications: No apparent anesthesia complications

## 2020-01-15 NOTE — Progress Notes (Signed)
Anesthesia present for case 

## 2020-01-15 NOTE — Progress Notes (Signed)
\  Echocardiogram 2D Echocardiogram has been performed.  Nicholas [name] 01/15/2020, 11:36 AM

## 2020-01-15 NOTE — Progress Notes (Signed)
OT Cancellation Note  Patient Details Name: Nicholas [name] MRN: [id number] DOB: November 29, 1950   Cancelled Treatment:    Reason Eval/Treat Not Completed: Active bedrest order. Pt with strict bed rest order and per RN may be going to angiogram today. Will return as schedule allows.   Lacharles Altschuler M Kayen Grabel Rayansh Herbst MSOT, OTR/L Acute Rehab Pager: [phone] Office: [phone] 01/15/2020, 9:57 AM

## 2020-01-15 NOTE — Progress Notes (Signed)
Pt came to PACU & dc to ICU on Cleviprex drip at 1ml, 12 mg/hr.

## 2020-01-15 NOTE — Anesthesia Procedure Notes (Signed)
Arterial Line Insertion Start/End4/27/2021 1:57 PM, 01/15/2020 1:58 PM Performed by: Rosalio Macadamia, CRNA, CRNA  Patient location: OR. Preanesthetic checklist: patient identified, IV checked, site marked, risks and benefits discussed, surgical consent, monitors and equipment checked, pre-op evaluation, timeout performed and anesthesia consent Lidocaine 1% used for infiltration and patient sedated Left, radial was placed Catheter size: 20 G Hand hygiene performed  and maximum sterile barriers used   Attempts: 1 Procedure performed without using ultrasound guided technique. Following insertion, dressing applied. Post procedure assessment: normal  Patient tolerated the procedure well with no immediate complications.

## 2020-01-15 NOTE — Anesthesia Preprocedure Evaluation (Addendum)
Anesthesia Evaluation  Patient identified by MRN, date of birth, ID band Patient awake    Reviewed: Allergy & Precautions, NPO status , Patient's Chart, lab work & pertinent test results  Airway Mallampati: II  TM Distance: >3 FB Neck ROM: Full    Dental no notable dental hx. (+) Dental Advisory Given   Pulmonary neg pulmonary ROS,    Pulmonary exam normal breath sounds clear to auscultation       Cardiovascular hypertension, Pt. on medications Normal cardiovascular exam Rhythm:Regular Rate:Normal     Neuro/Psych  Headaches, negative psych ROS   GI/Hepatic negative GI ROS, (+) Hepatitis -  Endo/Other  negative endocrine ROSdiabetes  Renal/GU negative Renal ROS     Musculoskeletal negative musculoskeletal ROS (+)   Abdominal   Peds  Hematology negative hematology ROS (+)   Anesthesia Other Findings   Reproductive/Obstetrics                             Anesthesia Physical Anesthesia Plan  ASA: IV  Anesthesia Plan: General and MAC   Post-op Pain Management:    Induction: Intravenous  PONV Risk Score and Plan: 2 and Ondansetron, Dexamethasone and Treatment may vary due to age or medical condition  Airway Management Planned: Oral ETT and Natural Airway  Additional Equipment: Arterial line  Intra-op Plan:   Post-operative Plan: Extubation in OR  Informed Consent: I have reviewed the patients History and Physical, chart, labs and discussed the procedure including the risks, benefits and alternatives for the proposed anesthesia with the patient or authorized representative who has indicated his/her understanding and acceptance.     Dental advisory given  Plan Discussed with: CRNA  Anesthesia Plan Comments:        Anesthesia Quick Evaluation

## 2020-01-15 NOTE — Progress Notes (Signed)
PT Cancellation Note  Patient Details Name: Nicholas [name] MRN: [id number] DOB: 1951/08/27   Cancelled Treatment:    Reason Eval/Treat Not Completed: Active bedrest order. Per RN pt potentially going for angiogram today. PT to hold until pt stable to progress mobility and PT evaluation completed.   Lewis Shock, PT, DPT Acute Rehabilitation Services Pager #: [phone] Office #: [phone]     Iona Hansen 01/15/2020, 9:53 AM

## 2020-01-15 NOTE — Progress Notes (Signed)
Per PACU nurse, sheath pulled at 1452

## 2020-01-15 NOTE — Progress Notes (Signed)
Transcranial Doppler  Date POD PCO2 HCT BP  MCA ACA PCA OPHT SIPH VERT Basilar  4/27 MS     Right  Left   197  64   *  105   12  -17   27  23    150  29   -23  -31   -36           Right  Left                                            Right  Left                                             Right  Left                                             Right  Left                                            Right  Left                                            Right  Left                                        MCA = Middle Cerebral Artery      OPHT = Opthalmic Artery     BASILAR = Basilar Artery   ACA = Anterior Cerebral Artery     SIPH = Carotid Siphon PCA = Posterior Cerebral Artery   VERT = Verterbral Artery                   Normal MCA = 62+\-12 ACA = 50+\-12 PCA = 42+\-23  *Unable to insonate 01/15/2020- Right Lindegaard ratio=7.3, left Lindegaard ratio=1.68  01/15/2020 9:53 AM 01/17/2020., MHA, RVT, RDCS, RDMS

## 2020-01-15 NOTE — Progress Notes (Signed)
  NEUROSURGERY PROGRESS NOTE   Admission history reviewed in EMR and with patient and family. Briefly, the patient reports sudden onset of posterior neck pain and headache day prior to admission.  He had some associated blurry vision which has resolved.  No numbness, tingling, or weakness of the extremities.  Of note, he has a history of medically controlled hypertension and type 2 diabetes.  He is a nonsmoker, without any known family history of intracranial aneurysms or hemorrhage.  EXAM:  BP (!) 146/72 (BP Location: Left Arm) Comment: PRN labetalol given  Pulse 99   Temp 99.3 F (37.4 C) (Axillary)   Resp 20   Ht 6' (1.829 m)   Wt 90.7 kg   SpO2 92%   BMI 27.12 kg/m   Awake, alert, oriented  Speech fluent, appropriate  CN grossly intact  5/5 BUE/BLE   IMAGING:   CT head demonstrates diffuse subarachnoid hemorrhage within the basal cisterns, sylvian fissure, and interhemispheric fissure.  There is a small amount of blood layering within the inferior portion of the 4th ventricle, as well as hemorrhage within the left-sided paramedian posterior fossa arachnoid cyst.    CT angiogram was also reviewed which does demonstrate evidence of vasospasm or stenosis involving the proximal segments of the anterior cerebral arteries bilaterally.  I do not see any intracranial aneurysms.  IMPRESSION:  69 y.o. 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  I spoke at length with the patient and his family regarding the imaging findings thus far. I explained to them that intracranial aneurysm was the most common non-traumatic cause for [facility] and that the definitive diagnosis is made by diagnostic angiogram. I also explained to them the possible treatment options for intracranial aneurysms including endovascular coiling and open clip ligation. The risks of the angiogram, coiling, and surgical  clipping were also reviewed to include stroke and aneurysm re-rupture leading to weakness/paralysis/coma/death, infection, SZ, hydrocephalus. We also discussed the possibility of negative angiogram in which case we would likely continue to observe and repeat the angiogram in a few days.  The patient and his family understood our discussion and he provided consent to proceed with diagnostic angiogram and the appropriate treatment for any identified aneurysm.  All their questions this morning were answered.

## 2020-01-15 NOTE — Progress Notes (Signed)
Palliative Medicine RN Note: Consult order noted. Dr Phillips Odor will be seeing Mr Kissner either later today or tomorrow morning; I left a message on Mrs Jelinek's cell phone to let her know. Note that he may be going for angiogram today.   Margret Chance Shellsea Borunda, RN, BSN, [facility] Palliative Medicine Team 01/15/2020 10:40 AM Office [phone]

## 2020-01-15 NOTE — Progress Notes (Signed)
PULMONARY / CRITICAL CARE MEDICINE   Name: Nicholas [name] MRN: [id number] DOB: Feb 14, 1951    ADMISSION DATE:  01/14/2020 CONSULTATION DATE:  01/14/20   CHIEF COMPLAINT: Change in mental status, acute severe HA and neck pain  HISTORY OF PRESENT ILLNESS:   83yr M presented for persistent headaches which gradually worsened. The patient stated that after dinner the headache was suddenly extremely intense and agonizing. Patient has a history of migranes and stated this was unlike any migraine he had previously which is typically after a serious of visual auras and photophobia. There was no prodrome symptoms that suggested a migraine to the patient. On presentation the patients wife was the main historian.   When the patient woke up the next morning he stated he was not feeling well, with nausea and vomiting. Patient was transported by EMS to [facility] for imaging and transferred to [facility] for further evaluation.  This morning when discussed progression of symptoms, the patient stated he felt this was due to malpositioning of his neck while in bed and this is what caused the severe neck and head pain. He described obvious meningeal signs such as neck irritation which was more pronounced when moving his neck in any position.  No numbness, tingling, or weakness of the extremities.     PAST MEDICAL HISTORY :  He  has a past medical history of BPH (benign prostatic hypertrophy) (12/20/2011), Colon polyp, Deviated septum, Diabetes mellitus without complication (HCC), Hepatitis A, HSV-1 (herpes simplex virus 1) infection, Hyperlipidemia, Hypertension, and Migraine headache.  PAST SURGICAL HISTORY: He  has a past surgical history that includes Nasal septum surgery.  No Known Allergies  No current facility-administered medications on file prior to encounter.   Current Outpatient Medications on File Prior to Encounter  Medication Sig  . aspirin (ASPIRIN ADULT LOW STRENGTH) 81 MG EC tablet Take 81  mg by mouth daily. Swallow whole.  Marland Kitchen atorvastatin (LIPITOR) 80 MG tablet Take 1 tablet (80 mg total) by mouth daily.  . cholecalciferol (VITAMIN D3) 25 MCG (1000 UNIT) tablet Take 1,000 Units by mouth daily.  . CRESTOR 40 MG tablet Take 1 tablet (40 mg total) by mouth daily.  . fenofibrate 160 MG tablet Take 1 tablet (160 mg total) by mouth daily.  . insulin aspart (NOVOLOG) 100 UNIT/ML FlexPen Inject 75 Units into the skin daily.   . insulin degludec (TRESIBA) 200 UNIT/ML FlexTouch Pen Inject 35 Units into the skin daily.   . insulin detemir (LEVEMIR) 100 UNIT/ML FlexPen Inject 20 Units into the skin See admin instructions. Sliding scale : Takes 15 units if blood sugar more than 300. Max 20 units.  Marland Kitchen lisinopril (PRINIVIL,ZESTRIL) 20 MG tablet Take 1 tablet (20 mg total) by mouth daily.  Marland Kitchen XIGDUO XR 06-999 MG TB24 Take 1 tablet by mouth daily.  Marland Kitchen glipiZIDE (GLUCOTROL XL) 10 MG 24 hr tablet Take 1 tablet (10 mg total) by mouth daily with breakfast. (Patient not taking: Reported on 01/14/2020)  . omega-3 acid ethyl esters (LOVAZA) 1 G capsule Take 2 capsules (2 g total) by mouth 2 (two) times daily. (Patient not taking: Reported on 01/14/2020)  . pioglitazone (ACTOS) 45 MG tablet Take 1 tablet (45 mg total) by mouth daily. (Patient not taking: Reported on 01/14/2020)  . sitaGLIPtin-metformin (JANUMET) 50-1000 MG per tablet Take 1 tablet by mouth 2 (two) times daily with a meal. (Patient not taking: Reported on 01/14/2020)    FAMILY HISTORY:  His He indicated that his mother is  alive. He indicated that his father is deceased. He indicated that his sister is alive. He indicated that his brother is alive.   SOCIAL HISTORY: He  reports that he has never smoked. He has never used smokeless tobacco. He reports current alcohol use. He reports that he does not use drugs.   VITAL SIGNS: BP (!) 146/72 (BP Location: Left Arm) Comment: PRN labetalol given  Pulse 99   Temp 99.3 F (37.4 C) (Axillary)    Resp 20   Ht 6' (1.829 m)   Wt 90.7 kg   SpO2 92%   BMI 27.12 kg/m   HEMODYNAMICS: N/A     VENTILATOR SETTINGS: N/A    INTAKE / OUTPUT: I/O last 3 completed shifts: In: 2879.3 [I.V.:2157.8; IV Piggyback:721.5] Out: 2150 [Urine:2150]  PHYSICAL EXAMINATION: Physical Exam Vitals reviewed.      General Appearance: Alert, cooperative, no distress, appears stated age Head: Normocephalic, without obvious abnormality, atraumatic Eyes: PERRL, conjunctiva/corneas clear, EOM's intact, fundi benign, both eyes      Lungs:  respirations unlabored, no pathologic lung sounds noted.  Heart: Regular rate and rhythm  NEUROLOGIC:   Mental status: A&O x4, no aphasia, good attention span, Memory and fund of knowledge Motor Exam - grossly normal. Moves all extremities with appropriate strength Sensory Exam - grossly normal Reflexes: symmetric, no pathologic reflexes, No Hoffman's, No clonus Coordination - grossly normal Gait - not tested Balance -not tested Cranial Nerves: CN motor function normal, mild right sided tongue deviation on protrusion, uvula midline. PEERLA, EOM intact, Hearing intact, facial motor function in tact.   LABS:  BMET Recent Labs  Lab 01/14/20 1021 01/14/20 1022 01/15/20 0452  NA 139 141 145  K 3.9 3.9 3.8  CL 101  --  112*  CO2 20*  --  23  BUN 27*  --  35*  CREATININE 1.13  --  1.18  GLUCOSE 343*  --  238*    Electrolytes Recent Labs  Lab 01/14/20 1021 01/15/20 0452  CALCIUM 9.7 9.5  MG  --  2.2  PHOS  --  1.4*    CBC Recent Labs  Lab 01/14/20 1021 01/14/20 1022 01/15/20 0452  WBC 14.6*  --  12.1*  HGB 15.9 16.7 15.2  HCT 46.7 49.0 45.6  PLT 232  --  256    Coag's Recent Labs  Lab 01/14/20 1022  APTT 27  INR 1.1    Sepsis Markers Recent Labs  Lab 01/14/20 1021 01/14/20 1230  LATICACIDVEN 3.2* 2.7*    ABG No results for input(s): PHART, PCO2ART, PO2ART in the last 168 hours.  Liver Enzymes Recent Labs  Lab  01/14/20 1021  AST 28  ALT 41  ALKPHOS 44  BILITOT 1.3*  ALBUMIN 5.2*    Cardiac Enzymes No results for input(s): TROPONINI, PROBNP in the last 168 hours.  Glucose Recent Labs  Lab 01/14/20 1020 01/14/20 1742 01/14/20 2000 01/14/20 2317 01/15/20 0319 01/15/20 0746  GLUCAP 324* 369* 419* 302* 223* 287*    Imaging CT Angio Head W or Wo Contrast  Result Date: 01/14/2020 CLINICAL DATA:  Acute headache.  Normal neuro exam. EXAM: CT ANGIOGRAPHY HEAD AND NECK TECHNIQUE: Multidetector CT imaging of the head and neck was performed using the standard protocol during bolus administration of intravenous contrast. Multiplanar CT image reconstructions and MIPs were obtained to evaluate the vascular anatomy. Carotid stenosis measurements (when applicable) are obtained utilizing NASCET criteria, using the distal internal carotid diameter as the denominator. CONTRAST:  OMNIPAQUE IOHEXOL  350 MG/ML SOLN COMPARISON:  None. FINDINGS: CT HEAD FINDINGS Brain: Large volume acute subarachnoid hemorrhage. Large amount of subarachnoid blood is seen in the basilar cisterns and extending into the sylvian fissure bilaterally right greater than left. Interhemispheric subarachnoid hemorrhage is present. Hemorrhage along the right tentorium medially may represent subdural or subarachnoid hemorrhage. Generalized atrophy without hydrocephalus. No acute infarct or mass. Vascular: Limited vascular evaluation due to subarachnoid hemorrhage. Skull: Negative Sinuses: Negative Orbits: Negative Review of the MIP images confirms the above findings CTA NECK FINDINGS Aortic arch: Standard branching. Imaged portion shows no evidence of aneurysm or dissection. No significant stenosis of the major arch vessel origins. Mild atherosclerotic calcification aortic arch. Right carotid system: Atherosclerotic calcification right carotid bifurcation. Approximately 25% diameter stenosis right internal carotid artery. Left carotid system:  Atherosclerotic calcification left carotid bifurcation and carotid bulb. 50% diameter stenosis proximal left internal carotid artery. Vertebral arteries: Both vertebral arteries widely patent to the basilar without significant stenosis. Skeleton: Mild degenerative changes cervical spine without acute skeletal abnormality. Other neck: Negative for mass or adenopathy in the neck. Upper chest: Negative Review of the MIP images confirms the above findings CTA HEAD FINDINGS Anterior circulation: Atherosclerotic calcification in the cavernous carotid bilaterally with mild to moderate stenosis bilaterally. No aneurysm. Anterior and middle cerebral arteries patent bilaterally without large vessel occlusion. No aneurysm. Posterior circulation: Both vertebral arteries patent to the basilar. Basilar is small in caliber but patent. Superior cerebellar arteries patent bilaterally. Posterior cerebral arteries patent bilaterally. Fetal origin left posterior cerebral artery. No aneurysm in the posterior circulation. Venous sinuses: Normal venous enhancement. Anatomic variants: None Review of the MIP images confirms the above findings IMPRESSION: 1. Large volume subarachnoid hemorrhage, right greater than left. Possible right tentorial subdural hemorrhage versus subarachnoid hemorrhage. 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 Recommend catheter angiogram for further evaluation. 2. Negative for hydrocephalus.  No acute infarct. 3. Small caliber basilar artery could represent basal spasm. 4. 25% diameter stenosis proximal right internal carotid artery. 50% diameter stenosis proximal left internal carotid artery. Mild to moderate stenosis in the cavernous carotid bilaterally due to atherosclerotic calcification 5. These results were called by telephone at the time of interpretation on 01/14/2020 at 11:47 am to provider ABIGAIL HARRIS , who verbally acknowledged these results.  Electronically Signed   By: Marlan Palau M.D.   On: 01/14/2020 11:49   CT Angio Neck W and/or Wo Contrast  Result Date: 01/14/2020 CLINICAL DATA:  Acute headache.  Normal neuro exam. EXAM: CT ANGIOGRAPHY HEAD AND NECK TECHNIQUE: Multidetector CT imaging of the head and neck was performed using the standard protocol during bolus administration of intravenous contrast. Multiplanar CT image reconstructions and MIPs were obtained to evaluate the vascular anatomy. Carotid stenosis measurements (when applicable) are obtained utilizing NASCET criteria, using the distal internal carotid diameter as the denominator. CONTRAST:  OMNIPAQUE IOHEXOL 350 MG/ML SOLN COMPARISON:  None. FINDINGS: CT HEAD FINDINGS Brain: Large volume acute subarachnoid hemorrhage. Large amount of subarachnoid blood is seen in the basilar cisterns and extending into the sylvian fissure bilaterally right greater than left. Interhemispheric subarachnoid hemorrhage is present. Hemorrhage along the right tentorium medially may represent subdural or subarachnoid hemorrhage. Generalized atrophy without hydrocephalus. No acute infarct or mass. Vascular: Limited vascular evaluation due to subarachnoid hemorrhage. Skull: Negative Sinuses: Negative Orbits: Negative Review of the MIP images confirms the above findings CTA NECK FINDINGS Aortic arch: Standard branching. Imaged portion shows no evidence of  aneurysm or dissection. No significant stenosis of the major arch vessel origins. Mild atherosclerotic calcification aortic arch. Right carotid system: Atherosclerotic calcification right carotid bifurcation. Approximately 25% diameter stenosis right internal carotid artery. Left carotid system: Atherosclerotic calcification left carotid bifurcation and carotid bulb. 50% diameter stenosis proximal left internal carotid artery. Vertebral arteries: Both vertebral arteries widely patent to the basilar without significant stenosis. Skeleton: Mild  degenerative changes cervical spine without acute skeletal abnormality. Other neck: Negative for mass or adenopathy in the neck. Upper chest: Negative Review of the MIP images confirms the above findings CTA HEAD FINDINGS Anterior circulation: Atherosclerotic calcification in the cavernous carotid bilaterally with mild to moderate stenosis bilaterally. No aneurysm. Anterior and middle cerebral arteries patent bilaterally without large vessel occlusion. No aneurysm. Posterior circulation: Both vertebral arteries patent to the basilar. Basilar is small in caliber but patent. Superior cerebellar arteries patent bilaterally. Posterior cerebral arteries patent bilaterally. Fetal origin left posterior cerebral artery. No aneurysm in the posterior circulation. Venous sinuses: Normal venous enhancement. Anatomic variants: None Review of the MIP images confirms the above findings IMPRESSION: 1. Large volume subarachnoid hemorrhage, right greater than left. Possible right tentorial subdural hemorrhage versus subarachnoid hemorrhage. 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 Recommend catheter angiogram for further evaluation. 2. Negative for hydrocephalus.  No acute infarct. 3. Small caliber basilar artery could represent basal spasm. 4. 25% diameter stenosis proximal right internal carotid artery. 50% diameter stenosis proximal left internal carotid artery. Mild to moderate stenosis in the cavernous carotid bilaterally due to atherosclerotic calcification 5. These results were called by telephone at the time of interpretation on 01/14/2020 at 11:47 am to provider ABIGAIL HARRIS , who verbally acknowledged these results. Electronically Signed   By: Marlan Palauharles  Clark M.D.   On: 01/14/2020 11:49   DG Chest Port 1 View  Result Date: 01/14/2020 CLINICAL DATA:  Altered mental status.  Headache. EXAM: PORTABLE CHEST 1 VIEW COMPARISON:  None. FINDINGS: There is slight left base  atelectasis. Lungs elsewhere are clear. Heart is upper normal in size with pulmonary vascularity normal. No adenopathy. No bone lesions. IMPRESSION: Slight left base atelectasis. Lungs otherwise clear. Heart upper normal in size. Electronically Signed   By: Bretta BangWilliam  Woodruff III M.D.   On: 01/14/2020 10:04     STUDIES:  MRSA PCR neg Blood and urine cultures pending  CULTURES: Negative Viral (influenza A/B and COVID)   ANTIBIOTICS: Cefepime 2g IV q8hr Vanc 2000mg  IV q24hr Monitor renal function and dosing as needed   SIGNIFICANT EVENTS: No acute events overnight  LINES/TUBES: Peripheral IV line Anterior: Proximal R. Forearm (placed today)   DISCUSSION: The patient and his family have been compliant and understand the likely diagnosis and the process to work up, treat and preventive measures to be taken from now on. Neurosurgery will be discussing further diagnostic testing and treatment with the patient and family. Patient is able to comprehend and effectively communicate his thought process.    ASSESSMENT / PLAN:  Primary concern for the patient is diagnosis and further management of SAH. The patient is stable with full mental capacity and no neurologic deficit. Pain has been managed and the patient has been pleasant and answering questions. Motor function is normal, no spasticity is noted.  [facility].  PULMONARY A: Slight left base atelectasis. Lungs otherwise clear. Patient is afebrile without cough or any chest pain. Pulm exam revealed clear lung fields.  P:  Dexamethasone 10mg  IV q6hrs   CARDIOVASCULAR A: Patient has hx of HTN.    P:  -2D echocardiogram to be completed today -Rosuvastatin 40mg  tab qd PO -Lisinopril 20mg  tablet qd PO  RENAL A:  Patient has had increasing BUN and Cr in the setting of HTN and T2DM. The patient is likely to be undergoing angiogram later today and would need monitoring post procedure.   P:   -CTM  electrolytes and renal function -Maintain BP -IV 0.9% NS prn   GASTROINTESTINAL A:  Maintain proper GI function, patient currently has no complicated GI symptoms, nausea. Prophy care for reflux and constipation for functional stability.    P:   Pantoprazole 40mg  IV  Senna-Docusate    HEMATOLOGIC A:  Patient is hematologically stable, no anemia, slightly elevated WBC but afebrile. No concerning labs pursuant to hematological treatment regiments   P: N/A   INFECTIOUS A:  Mild WBC elevation, AG normal, patient is afebrile. Infectious treatment not indicated at this time   P:   -D/C Cefepime 2g IV q8hr -D/CVanc 2000mg  IV q24hr   ENDOCRINE A:  Glycemic control . Patient presented moderately hyperglycemic. Home treatment plan was considered inadequate by Diabetic coordination and re-evaluated.     P:  Diabetes Coordination- Appreciate recommendations PT NPO. Consider Levemir and Novolog only at this time.  -  Consider Levemir 20 units Q24 hours. -  Novolog 0-15 units Q4 hours while NPO. -  D/c meal coverage at this time.  - D/c all oral meds at this time as oral medication not recommended for inpatient management - Potassium phosphate infusion 73mmol in 5% dextrose   NEUROLOGIC A:  CT head demonstrates diffuse subarachnoid hemorrhage within the basal cisterns, sylvian fissure, and interhemispheric fissure.    Prior CT angiogram  Reviewed demonstrate evidence of vasospasm or stenosis involving the proximal segments of the anterior cerebral arteries bilaterally but was not diagnostic  P:   -Nimodipine 60mg  PO qd -Levitioracetam IVPB 500mg /157mL q12 IV - Diagnostic angiogram likely today, possible treatment of any identified aneurysm. RASS goal:     FAMILY  - Wife present at bedside  - Inter-disciplinary family meet or Palliative Care meeting due by:  day [place]. [place] Student 01/15/2020, 8:50 AM

## 2020-01-16 ENCOUNTER — Inpatient Hospital Stay (HOSPITAL_COMMUNITY): Payer: BC Managed Care – PPO

## 2020-01-16 DIAGNOSIS — I67848 Other cerebrovascular vasospasm and vasoconstriction: Secondary | ICD-10-CM

## 2020-01-16 DIAGNOSIS — Z7189 Other specified counseling: Secondary | ICD-10-CM

## 2020-01-16 DIAGNOSIS — I609 Nontraumatic subarachnoid hemorrhage, unspecified: Secondary | ICD-10-CM

## 2020-01-16 LAB — CBC
HCT: 43.4 % (ref 39.0–52.0)
Hemoglobin: 14.2 g/dL (ref 13.0–17.0)
MCH: 29.6 pg (ref 26.0–34.0)
MCHC: 32.7 g/dL (ref 30.0–36.0)
MCV: 90.6 fL (ref 80.0–100.0)
Platelets: 231 10*3/uL (ref 150–400)
RBC: 4.79 MIL/uL (ref 4.22–5.81)
RDW: 13.8 % (ref 11.5–15.5)
WBC: 23.8 10*3/uL — ABNORMAL HIGH (ref 4.0–10.5)
nRBC: 0 % (ref 0.0–0.2)

## 2020-01-16 LAB — BASIC METABOLIC PANEL
Anion gap: 12 (ref 5–15)
BUN: 41 mg/dL — ABNORMAL HIGH (ref 8–23)
CO2: 22 mmol/L (ref 22–32)
Calcium: 8.9 mg/dL (ref 8.9–10.3)
Chloride: 109 mmol/L (ref 98–111)
Creatinine, Ser: 1 mg/dL (ref 0.61–1.24)
GFR calc Af Amer: >60 mL/min (ref >60)
GFR calc non Af Amer: >60 mL/min (ref >60)
Glucose, Bld: 135 mg/dL — ABNORMAL HIGH (ref 70–99)
Potassium: 4.3 mmol/L (ref 3.5–5.1)
Sodium: 143 mmol/L (ref 135–145)

## 2020-01-16 LAB — PHOSPHORUS: Phosphorus: 3.6 mg/dL (ref 2.5–4.6)

## 2020-01-16 LAB — MAGNESIUM: Magnesium: 2.1 mg/dL (ref 1.7–2.4)

## 2020-01-16 LAB — GLUCOSE, CAPILLARY
Glucose-Capillary: 177 mg/dL — ABNORMAL HIGH (ref 70–99)
Glucose-Capillary: 167 mg/dL — ABNORMAL HIGH (ref 70–99)
Glucose-Capillary: 201 mg/dL — ABNORMAL HIGH (ref 70–99)
Glucose-Capillary: 176 mg/dL — ABNORMAL HIGH (ref 70–99)
Glucose-Capillary: 189 mg/dL — ABNORMAL HIGH (ref 70–99)
Glucose-Capillary: 228 mg/dL — ABNORMAL HIGH (ref 70–99)

## 2020-01-16 LAB — TRIGLYCERIDES: Triglycerides: 448 mg/dL — ABNORMAL HIGH (ref <150)

## 2020-01-16 MED ORDER — LEVETIRACETAM 500 MG PO TABS
500.0000 mg | ORAL_TABLET | Freq: Two times a day (BID) | ORAL | Status: DC
  Administered 2020-01-16 – 2020-01-17 (×3): 500 mg via ORAL
  Filled 2020-01-16 (×4): qty 1

## 2020-01-16 MED ORDER — SIMETHICONE 80 MG PO CHEW
160.0000 mg | CHEWABLE_TABLET | Freq: Four times a day (QID) | ORAL | Status: DC | PRN
  Administered 2020-01-16 – 2020-01-17 (×5): 160 mg via ORAL
  Filled 2020-01-16 (×5): qty 2

## 2020-01-16 MED ORDER — INSULIN ASPART 100 UNIT/ML ~~LOC~~ SOLN
0.0000 [IU] | Freq: Three times a day (TID) | SUBCUTANEOUS | Status: DC
  Administered 2020-01-16: 4 [IU] via SUBCUTANEOUS
  Administered 2020-01-16 – 2020-01-17 (×2): 7 [IU] via SUBCUTANEOUS
  Administered 2020-01-17 (×2): 4 [IU] via SUBCUTANEOUS
  Administered 2020-01-18: 7 [IU] via SUBCUTANEOUS
  Administered 2020-01-18: 4 [IU] via SUBCUTANEOUS
  Administered 2020-01-19: 7 [IU] via SUBCUTANEOUS
  Administered 2020-01-19 (×2): 11 [IU] via SUBCUTANEOUS

## 2020-01-16 MED ORDER — PANTOPRAZOLE SODIUM 40 MG PO TBEC
40.0000 mg | DELAYED_RELEASE_TABLET | Freq: Every day | ORAL | Status: DC
  Administered 2020-01-16: 40 mg via ORAL
  Filled 2020-01-16: qty 1

## 2020-01-16 MED ORDER — INSULIN ASPART 100 UNIT/ML ~~LOC~~ SOLN
5.0000 [IU] | Freq: Three times a day (TID) | SUBCUTANEOUS | Status: DC
  Administered 2020-01-18: 5 [IU] via SUBCUTANEOUS

## 2020-01-16 NOTE — Evaluation (Signed)
Physical Therapy Evaluation Patient Details Name: Nicholas [name] MRN: [id number] DOB: 1951-07-29 Today's Date: 01/16/2020   History of Present Illness  69 year old male presented to the ED today for persistent headaches that have gradually worsened since yesterday evening accompanied by nausea and vomiting. Spouse acknowledges some confusion is present. CTA demonstrating a large SAH in the basilar cisterns extending into bilateral sylvian fissures. Pt underwent cerebral angiogram on 4/27.  Clinical Impression  Pt presents to PT with deficits in gait, balance, endurance. Pt is generally unsteady during OOB activity this session with initial posterior LOB upon standing and ataxic gait intermittently during session with 2 instances of mild L knee buckling. Pt requiring PT/OT assistance to maintain balance and reduce falls risk. Pt will benefit from continued acute PT POC and aggressive mobilization to improve balance and gait quality and restore independence.    Follow Up Recommendations Home health PT;Supervision/Assistance - 24 hour(may progress to outpatient or no PT needs)    Equipment Recommendations  None recommended by PT(no needs, pt owns necessary DME)    Recommendations for Other Services       Precautions / Restrictions Precautions Precautions: Fall Restrictions Weight Bearing Restrictions: No      Mobility  Bed Mobility Overal bed mobility: Needs Assistance Bed Mobility: Supine to Sit     Supine to sit: Supervision        Transfers Overall transfer level: Needs assistance Equipment used: None Transfers: Sit to/from Stand Sit to Stand: Min assist         General transfer comment: initial posterior LOB once coming to standing  Ambulation/Gait Ambulation/Gait assistance: Min assist Gait Distance (Feet): 30 Feet Assistive device: 1 person hand held assist Gait Pattern/deviations: Step-to pattern Gait velocity: reduced Gait velocity interpretation: <1.8 ft/sec,  indicate of risk for recurrent falls General Gait Details: pt with short step to gait progressing to step through gait, two L knee buckles noted, generally unsteady  Stairs            Wheelchair Mobility    Modified Rankin (Stroke Patients Only) Modified Rankin (Stroke Patients Only) Pre-Morbid Rankin Score: No symptoms Modified Rankin: Moderately severe disability     Balance Overall balance assessment: Needs assistance Sitting-balance support: No upper extremity supported;Feet supported Sitting balance-Leahy Scale: Fair Sitting balance - Comments: close supervision donning socks at edge of bed   Standing balance support: Single extremity supported Standing balance-Leahy Scale: Fair Standing balance comment: minG-minA for dynamic standign balance                             Pertinent Vitals/Pain Pain Assessment: Faces Faces Pain Scale: Hurts little more Pain Location: head Pain Descriptors / Indicators: Aching Pain Intervention(s): Monitored during session    Home Living Family/patient expects to be discharged to:: Private residence Living Arrangements: Spouse/significant other Available Help at Discharge: Family;Available 24 hours/day Type of Home: House Home Access: Stairs to enter Entrance Stairs-Rails: Right Entrance Stairs-Number of Steps: 3 Home Layout: One level Home Equipment: Walker - 2 wheels;Crutches;Bedside commode      Prior Function Level of Independence: Independent         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        Extremity/Trunk Assessment   Upper Extremity Assessment Upper Extremity Assessment: Defer to OT evaluation    Lower Extremity Assessment Lower Extremity Assessment: Overall WFL for tasks assessed    Cervical / Trunk Assessment Cervical /  Trunk Assessment: Normal  Communication   Communication: No difficulties  Cognition Arousal/Alertness: Awake/alert Behavior During Therapy: WFL for  tasks assessed/performed Overall Cognitive Status: Within Functional Limits for tasks assessed                                        General Comments General comments (skin integrity, edema, etc.): VSS on RA    Exercises     Assessment/Plan    PT Assessment Patient needs continued PT services  PT Problem List Decreased activity tolerance;Decreased balance;Decreased mobility;Pain       PT Treatment Interventions DME instruction;Gait training;Stair training;Functional mobility training;Therapeutic activities;Therapeutic exercise;Balance training;Neuromuscular re-education;Patient/family education    PT Goals (Current goals can be found in the Care Plan section)  Acute Rehab PT Goals Patient Stated Goal: To return to independence PT Goal Formulation: With patient Time For Goal Achievement: 01/30/20 Potential to Achieve Goals: Good Additional Goals Additional Goal #1: Pt will maintain dynamic standing balance within 10 inches of his base of support with unilateral UE support of the LRAD and supervision.    Frequency Min 4X/week   Barriers to discharge        Co-evaluation               AM-PAC PT "6 Clicks" Mobility  Outcome Measure Help needed turning from your back to your side while in a flat bed without using bedrails?: None Help needed moving from lying on your back to sitting on the side of a flat bed without using bedrails?: None Help needed moving to and from a bed to a chair (including a wheelchair)?: A Little Help needed standing up from a chair using your arms (e.g., wheelchair or bedside chair)?: A Little Help needed to walk in hospital room?: A Little Help needed climbing 3-5 steps with a railing? : A Lot 6 Click Score: 19    End of Session   Activity Tolerance: Patient tolerated treatment well Patient left: in chair;with call bell/phone within reach;with chair alarm set Nurse Communication: Mobility status PT Visit Diagnosis:  Unsteadiness on feet (R26.81);Other abnormalities of gait and mobility (R26.89)    Time: 9604-5409 PT Time Calculation (min) (ACUTE ONLY): 28 min   Charges:   PT Evaluation $PT Eval Moderate Complexity: 1 Mod          Arlyss Gandy, PT, DPT Acute Rehabilitation Pager: [phone]   Arlyss Gandy 01/16/2020, 11:31 AM

## 2020-01-16 NOTE — Progress Notes (Signed)
PULMONARY / CRITICAL CARE MEDICINE   Name: Nicholas [name] MRN: [id number] DOB: 1951-01-25    ADMISSION DATE:  01/14/2020 CONSULTATION DATE:  01/14/20   CHIEF COMPLAINT: Change in mental status, acute severe HA and neck pain  HISTORY OF PRESENT ILLNESS:   69yr M presented for persistent headaches which gradually worsened. The patient stated that after dinner the headache was suddenly extremely intense and agonizing. Patient has a history of migranes and stated this was unlike any migraine he had previously which is typically after a serious of visual auras and photophobia. There was no prodrome symptoms that suggested a migraine to the patient. On presentation the patients wife was the main historian.   When the patient woke up the next morning he stated he was not feeling well, with nausea and vomiting. Patient was transported by EMS to [facility] for imaging and transferred to [facility] for further evaluation.  This morning when discussed progression of symptoms, the patient stated he felt this was due to malpositioning of his neck while in bed and this is what caused the severe neck and head pain. He described obvious meningeal signs such as neck irritation which was more pronounced when moving his neck in any position.  No numbness, tingling, or weakness of the extremities.     PAST MEDICAL HISTORY :  He  has a past medical history of BPH (benign prostatic hypertrophy) (12/20/2011), Colon polyp, Deviated septum, Diabetes mellitus without complication (HCC), Hepatitis A, HSV-1 (herpes simplex virus 1) infection, Hyperlipidemia, Hypertension, and Migraine headache.  PAST SURGICAL HISTORY: He  has a past surgical history that includes Nasal septum surgery; IR ANGIO INTRA EXTRACRAN SEL INTERNAL CAROTID BILAT MOD SED (01/15/2020); IR ANGIO VERTEBRAL SEL VERTEBRAL BILAT MOD SED (01/15/2020); and IR ANGIO EXTERNAL CAROTID SEL EXT CAROTID BILAT MOD SED (01/15/2020).  No Known Allergies  No current  facility-administered medications on file prior to encounter.   Current Outpatient Medications on File Prior to Encounter  Medication Sig  . aspirin (ASPIRIN ADULT LOW STRENGTH) 81 MG EC tablet Take 81 mg by mouth daily. Swallow whole.  Marland Kitchen atorvastatin (LIPITOR) 80 MG tablet Take 1 tablet (80 mg total) by mouth daily.  . cholecalciferol (VITAMIN D3) 25 MCG (1000 UNIT) tablet Take 1,000 Units by mouth daily.  . CRESTOR 40 MG tablet Take 1 tablet (40 mg total) by mouth daily.  . fenofibrate 160 MG tablet Take 1 tablet (160 mg total) by mouth daily.  . insulin aspart (NOVOLOG) 100 UNIT/ML FlexPen Inject 75 Units into the skin daily.   . insulin degludec (TRESIBA) 200 UNIT/ML FlexTouch Pen Inject 35 Units into the skin daily.   . insulin detemir (LEVEMIR) 100 UNIT/ML FlexPen Inject 20 Units into the skin See admin instructions. Sliding scale : Takes 15 units if blood sugar more than 300. Max 20 units.  Marland Kitchen lisinopril (PRINIVIL,ZESTRIL) 20 MG tablet Take 1 tablet (20 mg total) by mouth daily.  Marland Kitchen XIGDUO XR 06-999 MG TB24 Take 1 tablet by mouth daily.  Marland Kitchen glipiZIDE (GLUCOTROL XL) 10 MG 24 hr tablet Take 1 tablet (10 mg total) by mouth daily with breakfast. (Patient not taking: Reported on 01/14/2020)  . omega-3 acid ethyl esters (LOVAZA) 1 G capsule Take 2 capsules (2 g total) by mouth 2 (two) times daily. (Patient not taking: Reported on 01/14/2020)  . pioglitazone (ACTOS) 45 MG tablet Take 1 tablet (45 mg total) by mouth daily. (Patient not taking: Reported on 01/14/2020)  . sitaGLIPtin-metformin (JANUMET) 50-1000 MG per  tablet Take 1 tablet by mouth 2 (two) times daily with a meal. (Patient not taking: Reported on 01/14/2020)    FAMILY HISTORY:  His He indicated that his mother is alive. He indicated that his father is deceased. He indicated that his sister is alive. He indicated that his brother is alive.   SOCIAL HISTORY: He  reports that he has never smoked. He has never used smokeless tobacco. He  reports current alcohol use. He reports that he does not use drugs.   VITAL SIGNS: BP 127/69   Pulse 80   Temp 98.4 F (36.9 C) (Axillary)   Resp 19   Ht 6' (1.829 m)   Wt 90.7 kg   SpO2 91%   BMI 27.12 kg/m   HEMODYNAMICS: N/A     VENTILATOR SETTINGS: N/A    INTAKE / OUTPUT: I/O last 3 completed shifts: In: 3893 [P.O.:1040; I.V.:2621.9; IV Piggyback:231.2] Out: 2300 [Urine:2300]  PHYSICAL EXAMINATION: Physical Exam Vitals reviewed.      General Appearance: Alert, cooperative, no distress, appears stated age Head: Normocephalic, without obvious abnormality, atraumatic Eyes: PERRL, conjunctiva/corneas clear, EOM's intact, fundi benign, both eyes      Lungs:  respirations unlabored, no pathologic lung sounds noted.  Heart: Regular rate and rhythm  NEUROLOGIC:   Mental status: A&O x4, no aphasia, good attention span, Memory and fund of knowledge Motor Exam - grossly normal. Moves all extremities with appropriate strength Sensory Exam - grossly normal Reflexes: symmetric, no pathologic reflexes, No Hoffman's, No clonus Coordination - grossly normal Gait - not tested Balance -not tested Cranial Nerves: CN motor function normal, mild right sided tongue deviation on protrusion, uvula midline. PEERLA, EOM intact, Hearing intact, facial motor function in tact.   LABS:  BMET Recent Labs  Lab 01/14/20 1021 01/14/20 1021 01/14/20 1022 01/15/20 0452 01/16/20 0416  NA 139   < > 141 145 143  K 3.9   < > 3.9 3.8 4.3  CL 101  --   --  112* 109  CO2 20*  --   --  23 22  BUN 27*  --   --  35* 41*  CREATININE 1.13  --   --  1.18 1.00  GLUCOSE 343*  --   --  238* 135*   < > = values in this interval not displayed.    Electrolytes Recent Labs  Lab 01/14/20 1021 01/15/20 0452 01/16/20 0416  CALCIUM 9.7 9.5 8.9  MG  --  2.2 2.1  PHOS  --  1.4* 3.6    CBC Recent Labs  Lab 01/14/20 1021 01/14/20 1021 01/14/20 1022 01/15/20 0452 01/16/20 0416  WBC 14.6*   --   --  12.1* 23.8*  HGB 15.9   < > 16.7 15.2 14.2  HCT 46.7   < > 49.0 45.6 43.4  PLT 232  --   --  256 231   < > = values in this interval not displayed.    Coag's Recent Labs  Lab 01/14/20 1022  APTT 27  INR 1.1    Sepsis Markers Recent Labs  Lab 01/14/20 1021 01/14/20 1230  LATICACIDVEN 3.2* 2.7*    ABG No results for input(s): PHART, PCO2ART, PO2ART in the last 168 hours.  Liver Enzymes Recent Labs  Lab 01/14/20 1021  AST 28  ALT 41  ALKPHOS 44  BILITOT 1.3*  ALBUMIN 5.2*    Cardiac Enzymes No results for input(s): TROPONINI, PROBNP in the last 168 hours.  Glucose Recent Labs  Lab  01/15/20 1119 01/15/20 1506 01/15/20 1645 01/15/20 1921 01/15/20 2257 01/16/20 0316  GLUCAP 324* 303* 277* 353* 314* 177*    Imaging ECHOCARDIOGRAM COMPLETE  Result Date: 01/15/2020    ECHOCARDIOGRAM REPORT   Patient Name:   Vanessa RalphsGARY S Arizola Date of Exam: 01/15/2020 Medical Rec #:  [id number]   Height:       72.0 in Accession #:    [id number]  Weight:       200.0 lb Date of Birth:  Jan 29, 1951   BSA:          2.131 m Patient Age:    68 years    BP:           146/72 mmHg Patient Gender: M           HR:           99 bpm. Exam Location:  Inpatient Procedure: 2D Echo Indications:    Murmur 785.2 / R01.1  History:        Patient has no prior history of Echocardiogram examinations.                 Risk Factors:Hypertension, Diabetes and Dyslipidemia. Hepatitis                 A subarachnoid hemorrhage.  Sonographer:    Leta Junglingiffany Cooper RDCS Referring Phys: [id number] Plummer CRAM IMPRESSIONS  1. Normal LV systolic function; proximal septal thickening; grade 1 diastolic dysfunction; mildly dilated aortic root; scerotic aortic valve.  2. Left ventricular ejection fraction, by estimation, is 70 to 75%. The left ventricle has hyperdynamic function. The left ventricle has no regional wall motion abnormalities. There is mild left ventricular hypertrophy of the basal segment. Left ventricular diastolic  parameters are consistent with Grade I diastolic dysfunction (impaired relaxation).  3. Right ventricular systolic function is normal. The right ventricular size is normal.  4. The mitral valve is normal in structure. No evidence of mitral valve regurgitation. No evidence of mitral stenosis.  5. The aortic valve is tricuspid. Aortic valve regurgitation is not visualized. Mild to moderate aortic valve sclerosis/calcification is present, without any evidence of aortic stenosis.  6. Aortic dilatation noted. There is mild dilatation of the aortic root measuring 38 mm.  7. The inferior vena cava is normal in size with greater than 50% respiratory variability, suggesting right atrial pressure of 3 mmHg. FINDINGS  Left Ventricle: Left ventricular ejection fraction, by estimation, is 70 to 75%. The left ventricle has hyperdynamic function. The left ventricle has no regional wall motion abnormalities. The left ventricular internal cavity size was normal in size. There is mild left ventricular hypertrophy of the basal segment. Left ventricular diastolic parameters are consistent with Grade I diastolic dysfunction (impaired relaxation). Right Ventricle: The right ventricular size is normal.Right ventricular systolic function is normal. Left Atrium: Left atrial size was normal in size. Right Atrium: Right atrial size was normal in size. Pericardium: There is no evidence of pericardial effusion. Mitral Valve: The mitral valve is normal in structure. There is mild calcification of the mitral valve leaflet(s). Normal mobility of the mitral valve leaflets. No evidence of mitral valve regurgitation. No evidence of mitral valve stenosis. Tricuspid Valve: The tricuspid valve is normal in structure. Tricuspid valve regurgitation is trivial. No evidence of tricuspid stenosis. Aortic Valve: The aortic valve is tricuspid. Aortic valve regurgitation is not visualized. Mild to moderate aortic valve sclerosis/calcification is present,  without any evidence of aortic stenosis. Pulmonic Valve: The pulmonic valve was normal in structure.  Pulmonic valve regurgitation is trivial. No evidence of pulmonic stenosis. Aorta: Aortic dilatation noted. There is mild dilatation of the aortic root measuring 38 mm. Venous: The inferior vena cava is normal in size with greater than 50% respiratory variability, suggesting right atrial pressure of 3 mmHg. IAS/Shunts: No atrial level shunt detected by color flow Doppler. Additional Comments: Normal LV systolic function; proximal septal thickening; grade 1 diastolic dysfunction; mildly dilated aortic root; scerotic aortic valve.  LEFT VENTRICLE PLAX 2D LVIDd:         4.50 cm  Diastology LVIDs:         2.60 cm  LV e' lateral:   6.48 cm/s LV PW:         0.90 cm  LV E/e' lateral: 15.7 LV IVS:        1.70 cm  LV e' medial:    7.18 cm/s LVOT diam:     2.20 cm  LV E/e' medial:  14.2 LV SV:         63 LV SV Index:   30 LVOT Area:     3.80 cm  RIGHT VENTRICLE RV S prime:     15.90 cm/s TAPSE (M-mode): 2.3 cm LEFT ATRIUM           Index LA diam:      4.10 cm 1.92 cm/m LA Vol (A2C): 36.6 ml 17.18 ml/m LA Vol (A4C): 47.0 ml 22.06 ml/m  AORTIC VALVE LVOT Vmax:   99.00 cm/s LVOT Vmean:  71.100 cm/s LVOT VTI:    0.167 m  AORTA Ao Asc diam: 3.80 cm MITRAL VALVE MV Area (PHT): 4.31 cm     SHUNTS MV Decel Time: 176 msec     Systemic VTI:  0.17 m MV E velocity: 102.00 cm/s  Systemic Diam: 2.20 cm MV A velocity: 104.00 cm/s MV E/A ratio:  0.98 Kirk Ruths MD Electronically signed by Kirk Ruths MD Signature Date/Time: 01/15/2020/2:22:50 PM    Final    VAS [place] TRANSCRANIAL DOPPLER  Result Date: 01/16/2020  Transcranial Doppler Indications: Subarachnoid hemorrhage. Comparison Study: No prior study Performing Technologist: Maudry Mayhew MHA, RDMS, RVT, RDCS  Examination Guidelines: A complete evaluation includes B-mode imaging, spectral Doppler, color Doppler, and power Doppler as needed of all accessible portions of each  vessel. Bilateral testing is considered an integral part of a complete examination. Limited examinations for reoccurring indications may be performed as noted.  +----------+-------------+----------+-----------+-------+ RIGHT TCD Right VM (cm)Depth (cm)PulsatilityComment +----------+-------------+----------+-----------+-------+ MCA          197.00                 1.08            +----------+-------------+----------+-----------+-------+ Term ICA     163.00                 1.02            +----------+-------------+----------+-----------+-------+ PCA           12.00                 3.00            +----------+-------------+----------+-----------+-------+ Opthalmic     27.00                 2.11            +----------+-------------+----------+-----------+-------+ ICA siphon   150.00                 1.03            +----------+-------------+----------+-----------+-------+  Vertebral    -23.00                 1.58            +----------+-------------+----------+-----------+-------+ Distal ICA    27.00                 1.92            +----------+-------------+----------+-----------+-------+  +----------+------------+----------+-----------+-------+ LEFT TCD  Left VM (cm)Depth (cm)PulsatilityComment +----------+------------+----------+-----------+-------+ MCA          64.00                 1.67            +----------+------------+----------+-----------+-------+ ACA         -105.00                1.03            +----------+------------+----------+-----------+-------+ Term ICA     112.00                0.81            +----------+------------+----------+-----------+-------+ PCA          17.00                 1.34            +----------+------------+----------+-----------+-------+ Opthalmic    25.00                 1.62            +----------+------------+----------+-----------+-------+ ICA siphon   29.00                 1.29             +----------+------------+----------+-----------+-------+ Vertebral    -31.00                1.50            +----------+------------+----------+-----------+-------+ Distal ICA   38.00                 1.20            +----------+------------+----------+-----------+-------+  +------------+-------+-------+             VM cm/sComment +------------+-------+-------+ Prox Basilar-36.00         +------------+-------+-------+ +----------------------+---+ Right Lindegaard Ratio7.3 +----------------------+---+ +---------------------+----+ Left Lindegaard Ratio1.68 +---------------------+----+  Summary:  Elevated mean flow velocities in right middle cerebral, terminal ICA and carotid siphon suggestive of moderate vasospasm and elevated left anterior cerebral and terminal ICA mean flow velocities suggest mild vasospasm. *See table(s) above for TCD measurements and observations.  Diagnosing physician: Delia Heady MD Electronically signed by Delia Heady MD on 01/16/2020 at 6:52:29 AM.    Final    IR ANGIO INTRA EXTRACRAN SEL INTERNAL CAROTID BILAT MOD SED  Result Date: 01/15/2020 PROCEDURE: DIAGNOSTIC CEREBRAL ANGIOGRAM HISTORY: The patient is a 69 year old man presenting to the hospital with sudden onset of severe neck pain headache without other focal neurologic deficit. CT scan demonstrated diffuse basal subarachnoid hemorrhage while CT angiogram was negative. He therefore presents today for further workup with diagnostic cerebral angiogram. ACCESS: The technical aspects of the procedure as well as its potential risks and benefits were reviewed with the patient. These risks included but were not limited bleeding, infection, allergic reaction, damage to organs or vital structures, stroke, non-diagnostic procedure, and the catastrophic outcomes of heart attack, coma, and death. With an understanding of these risks, informed consent was obtained and witnessed. The  patient was placed in the supine  position on the angiography table and the skin of right groin prepped in the usual sterile fashion. The procedure was performed under local anesthesia (1%-solution of bicarbonate-buffered Lidocaine) and conscious sedation administered by the anesthesia service. A 5- French sheath was introduced in the right common femoral artery using Seldinger technique. A fluoro-phase sequence was used to document the sheath position. MEDICATIONS: HEPARIN: 0 Units total. CONTRAST:  cc, Omnipaque 300 FLUOROSCOPY TIME:  FLUOROSCOPY TIME: See IR records TECHNIQUE: CATHETERS AND WIRES 5-French JB-1 catheter 0.035" glidewire VESSELS CATHETERIZED Right internal carotid Right external carotid Left internal carotid Left external carotid Left vertebral Right vertebral Right common femoral VESSELS STUDIED Right internal carotid, head Right external carotid head Left internal carotid, head Left external carotid, head Left vertebral Right vertebral Right femoral PROCEDURAL NARRATIVE A 5-Fr JB-1 catheter was advanced over a 0.035 glidewire into the aortic arch. The above vessels were then sequentially catheterized and cervical / cerebral angiograms taken. After review of images, the catheter was removed without incident. FINDINGS: Right internal carotid, head: Injection reveals the presence of a widely patent ICA, M1, and A1 segments and their branches. The distal supraclinoid segment the right internal carotid artery is significantly tapered suggesting significant vasospasm with the ICA terminus measuring 1.3 mm in maximal dimension. There is also significant narrowing of the proximal portions of the right M1. The anterior cerebral artery on the right and its distal territory is not visualized on this right carotid injection. There is significant washout of the right middle cerebral artery territory due to sluggish flow and likely collateral flow from the contralateral internal carotid. No aneurysms are identified however the right posterior  communicating artery is not well visualized due to spasm. The venous sinuses are widely patent. Right external carotid, head: The visualized cranial branches of the right external carotid artery are unremarkable. There is no early intracranial venous drainage identified. Left internal carotid, head: Injection reveals the presence of a widely patent ICA, A1, and M1 segments and their branches. No aneurysms, arteriovenous malformations, or high-flow fistulas are identified. There is good flow through the anterior communicating artery with visualization of the contralateral A1 and contralateral middle cerebral artery territory. The parenchymal and venous phases are normal. The venous sinuses are widely patent. Left external carotid, head: The visualized cranial branches of the left external carotid artery are unremarkable. Early venous drainage is seen Right vertebral: Injection reveals the presence of a widely patent vertebral artery. No luminal regularities or pseudoaneurysm/dissection is seen in the cervical segments of the right vertebral artery. This leads to a widely patent basilar artery that terminates in right P1. The left P1 is hypoplastic and not visualized. The basilar apex is normal. No aneurysms AVMs, or fistulas are seen. The parenchymal and venous phases are normal. The venous sinuses are widely patent. Left vertebral: The vertebral artery is widely patent. No cervical dissections or pseudoaneurysms are seen. No PICA aneurysm is seen. See basilar description above. Right femoral: Normal vessel. No significant atherosclerotic disease. Arterial sheath in adequate position. DISPOSITION: Upon completion of the study, the femoral sheath was removed and hemostasis obtained using a 5-Fr ExoSeal closure device. Good proximal and distal lower extremity pulses were documented upon achievement of hemostasis. The procedure was well tolerated and no early complications were observed. The patient was transferred to  the intensive care unit to lay flat for 2 hours. IMPRESSION: 1. Significant vasospasm involving the distal supraclinoid right internal carotid artery and proximal right M1.  This is the cause of significant flow limitation with good collateral flow from the contralateral carotid through the anterior communicating artery. 2. No aneurysms, AVMs, or high-flow fistulas are identified, although the right posterior communicating artery region is poorly visualized due to the above described vasospasm. The preliminary results of this procedure were shared with the patient and the patient's family. Electronically Signed   By: Lisbeth Renshaw   On: 01/15/2020 14:51   IR ANGIO VERTEBRAL SEL VERTEBRAL BILAT MOD SED  Result Date: 01/15/2020 PROCEDURE: DIAGNOSTIC CEREBRAL ANGIOGRAM HISTORY: The patient is a 69 year old man presenting to the hospital with sudden onset of severe neck pain headache without other focal neurologic deficit. CT scan demonstrated diffuse basal subarachnoid hemorrhage while CT angiogram was negative. He therefore presents today for further workup with diagnostic cerebral angiogram. ACCESS: The technical aspects of the procedure as well as its potential risks and benefits were reviewed with the patient. These risks included but were not limited bleeding, infection, allergic reaction, damage to organs or vital structures, stroke, non-diagnostic procedure, and the catastrophic outcomes of heart attack, coma, and death. With an understanding of these risks, informed consent was obtained and witnessed. The patient was placed in the supine position on the angiography table and the skin of right groin prepped in the usual sterile fashion. The procedure was performed under local anesthesia (1%-solution of bicarbonate-buffered Lidocaine) and conscious sedation administered by the anesthesia service. A 5- French sheath was introduced in the right common femoral artery using Seldinger technique. A fluoro-phase  sequence was used to document the sheath position. MEDICATIONS: HEPARIN: 0 Units total. CONTRAST:  cc, Omnipaque 300 FLUOROSCOPY TIME:  FLUOROSCOPY TIME: See IR records TECHNIQUE: CATHETERS AND WIRES 5-French JB-1 catheter 0.035" glidewire VESSELS CATHETERIZED Right internal carotid Right external carotid Left internal carotid Left external carotid Left vertebral Right vertebral Right common femoral VESSELS STUDIED Right internal carotid, head Right external carotid head Left internal carotid, head Left external carotid, head Left vertebral Right vertebral Right femoral PROCEDURAL NARRATIVE A 5-Fr JB-1 catheter was advanced over a 0.035 glidewire into the aortic arch. The above vessels were then sequentially catheterized and cervical / cerebral angiograms taken. After review of images, the catheter was removed without incident. FINDINGS: Right internal carotid, head: Injection reveals the presence of a widely patent ICA, M1, and A1 segments and their branches. The distal supraclinoid segment the right internal carotid artery is significantly tapered suggesting significant vasospasm with the ICA terminus measuring 1.3 mm in maximal dimension. There is also significant narrowing of the proximal portions of the right M1. The anterior cerebral artery on the right and its distal territory is not visualized on this right carotid injection. There is significant washout of the right middle cerebral artery territory due to sluggish flow and likely collateral flow from the contralateral internal carotid. No aneurysms are identified however the right posterior communicating artery is not well visualized due to spasm. The venous sinuses are widely patent. Right external carotid, head: The visualized cranial branches of the right external carotid artery are unremarkable. There is no early intracranial venous drainage identified. Left internal carotid, head: Injection reveals the presence of a widely patent ICA, A1, and M1  segments and their branches. No aneurysms, arteriovenous malformations, or high-flow fistulas are identified. There is good flow through the anterior communicating artery with visualization of the contralateral A1 and contralateral middle cerebral artery territory. The parenchymal and venous phases are normal. The venous sinuses are widely patent. Left external carotid, head: The visualized  cranial branches of the left external carotid artery are unremarkable. Early venous drainage is seen Right vertebral: Injection reveals the presence of a widely patent vertebral artery. No luminal regularities or pseudoaneurysm/dissection is seen in the cervical segments of the right vertebral artery. This leads to a widely patent basilar artery that terminates in right P1. The left P1 is hypoplastic and not visualized. The basilar apex is normal. No aneurysms AVMs, or fistulas are seen. The parenchymal and venous phases are normal. The venous sinuses are widely patent. Left vertebral: The vertebral artery is widely patent. No cervical dissections or pseudoaneurysms are seen. No PICA aneurysm is seen. See basilar description above. Right femoral: Normal vessel. No significant atherosclerotic disease. Arterial sheath in adequate position. DISPOSITION: Upon completion of the study, the femoral sheath was removed and hemostasis obtained using a 5-Fr ExoSeal closure device. Good proximal and distal lower extremity pulses were documented upon achievement of hemostasis. The procedure was well tolerated and no early complications were observed. The patient was transferred to the intensive care unit to lay flat for 2 hours. IMPRESSION: 1. Significant vasospasm involving the distal supraclinoid right internal carotid artery and proximal right M1. This is the cause of significant flow limitation with good collateral flow from the contralateral carotid through the anterior communicating artery. 2. No aneurysms, AVMs, or high-flow fistulas  are identified, although the right posterior communicating artery region is poorly visualized due to the above described vasospasm. The preliminary results of this procedure were shared with the patient and the patient's family. Electronically Signed   By: Lisbeth Renshaw   On: 01/15/2020 14:51   IR ANGIO EXTERNAL CAROTID SEL EXT CAROTID BILAT MOD SED  Result Date: 01/15/2020 PROCEDURE: DIAGNOSTIC CEREBRAL ANGIOGRAM HISTORY: The patient is a 69 year old man presenting to the hospital with sudden onset of severe neck pain headache without other focal neurologic deficit. CT scan demonstrated diffuse basal subarachnoid hemorrhage while CT angiogram was negative. He therefore presents today for further workup with diagnostic cerebral angiogram. ACCESS: The technical aspects of the procedure as well as its potential risks and benefits were reviewed with the patient. These risks included but were not limited bleeding, infection, allergic reaction, damage to organs or vital structures, stroke, non-diagnostic procedure, and the catastrophic outcomes of heart attack, coma, and death. With an understanding of these risks, informed consent was obtained and witnessed. The patient was placed in the supine position on the angiography table and the skin of right groin prepped in the usual sterile fashion. The procedure was performed under local anesthesia (1%-solution of bicarbonate-buffered Lidocaine) and conscious sedation administered by the anesthesia service. A 5- French sheath was introduced in the right common femoral artery using Seldinger technique. A fluoro-phase sequence was used to document the sheath position. MEDICATIONS: HEPARIN: 0 Units total. CONTRAST:  cc, Omnipaque 300 FLUOROSCOPY TIME:  FLUOROSCOPY TIME: See IR records TECHNIQUE: CATHETERS AND WIRES 5-French JB-1 catheter 0.035" glidewire VESSELS CATHETERIZED Right internal carotid Right external carotid Left internal carotid Left external carotid Left  vertebral Right vertebral Right common femoral VESSELS STUDIED Right internal carotid, head Right external carotid head Left internal carotid, head Left external carotid, head Left vertebral Right vertebral Right femoral PROCEDURAL NARRATIVE A 5-Fr JB-1 catheter was advanced over a 0.035 glidewire into the aortic arch. The above vessels were then sequentially catheterized and cervical / cerebral angiograms taken. After review of images, the catheter was removed without incident. FINDINGS: Right internal carotid, head: Injection reveals the presence of a widely patent ICA, M1,  and A1 segments and their branches. The distal supraclinoid segment the right internal carotid artery is significantly tapered suggesting significant vasospasm with the ICA terminus measuring 1.3 mm in maximal dimension. There is also significant narrowing of the proximal portions of the right M1. The anterior cerebral artery on the right and its distal territory is not visualized on this right carotid injection. There is significant washout of the right middle cerebral artery territory due to sluggish flow and likely collateral flow from the contralateral internal carotid. No aneurysms are identified however the right posterior communicating artery is not well visualized due to spasm. The venous sinuses are widely patent. Right external carotid, head: The visualized cranial branches of the right external carotid artery are unremarkable. There is no early intracranial venous drainage identified. Left internal carotid, head: Injection reveals the presence of a widely patent ICA, A1, and M1 segments and their branches. No aneurysms, arteriovenous malformations, or high-flow fistulas are identified. There is good flow through the anterior communicating artery with visualization of the contralateral A1 and contralateral middle cerebral artery territory. The parenchymal and venous phases are normal. The venous sinuses are widely patent. Left  external carotid, head: The visualized cranial branches of the left external carotid artery are unremarkable. Early venous drainage is seen Right vertebral: Injection reveals the presence of a widely patent vertebral artery. No luminal regularities or pseudoaneurysm/dissection is seen in the cervical segments of the right vertebral artery. This leads to a widely patent basilar artery that terminates in right P1. The left P1 is hypoplastic and not visualized. The basilar apex is normal. No aneurysms AVMs, or fistulas are seen. The parenchymal and venous phases are normal. The venous sinuses are widely patent. Left vertebral: The vertebral artery is widely patent. No cervical dissections or pseudoaneurysms are seen. No PICA aneurysm is seen. See basilar description above. Right femoral: Normal vessel. No significant atherosclerotic disease. Arterial sheath in adequate position. DISPOSITION: Upon completion of the study, the femoral sheath was removed and hemostasis obtained using a 5-Fr ExoSeal closure device. Good proximal and distal lower extremity pulses were documented upon achievement of hemostasis. The procedure was well tolerated and no early complications were observed. The patient was transferred to the intensive care unit to lay flat for 2 hours. IMPRESSION: 1. Significant vasospasm involving the distal supraclinoid right internal carotid artery and proximal right M1. This is the cause of significant flow limitation with good collateral flow from the contralateral carotid through the anterior communicating artery. 2. No aneurysms, AVMs, or high-flow fistulas are identified, although the right posterior communicating artery region is poorly visualized due to the above described vasospasm. The preliminary results of this procedure were shared with the patient and the patient's family. Electronically Signed   By: Lisbeth Renshaw   On: 01/15/2020 14:51     STUDIES:  MRSA PCR neg Blood and urine cultures  pending  CULTURES: Negative Viral (influenza A/B and COVID)   ANTIBIOTICS: Cefepime 2g IV q8hr Vanc 2000mg  IV q24hr Monitor renal function and dosing as needed   SIGNIFICANT EVENTS: No acute events overnight  LINES/TUBES: Peripheral IV line Anterior: Proximal R. Forearm (placed today)    DISCUSSION:  Primary concern for the patient is diagnosis and further management of SAH. The patient is stable with full mental capacity and no neurologic deficit. Pain has been managed and the patient has been pleasant and answering questions. Motor function is normal, no spasticity is noted. Further work up for [facility] is underway.   ASSESSMENT /  PLAN: Patient has not had any acute changes overnight. Pt has remained afebrile, denies any acute onset severe pain in chest, head or periphery. Pt has been urinating without difficulty and denies any SOB, dysphagia or changes in vision. Pt has had steady increase in BUN with its largest increase occurring between 2 most recent measurements. The pt did have contrast angiogram yesterday which can explain further increase as all other electrolytes levels have normalized. There is a slight increase in patients serum AG (currently 12), however, in conjunction with elevated BUN and high normal range creatinine post angiography can indicate transient mild contrast induced nephropathy. This is worth mentioning due to underlying DM, HTN, HLD and new status suggesting pt blood glucose is not as controlled as pt described per A1c.   Orders - Serum lactic acid - Dietary consultation - Diabetic Coordinator consult   SUBARACHNOID HEMORRHAGE CT on admission demonstrated diffuse SAH within basal cisterns sylvian fissure, and interhemispheric fissure. 4/27 Focal vasospasm involving supraclinoid right ICA and proximal right M1 and A1 with notable flow limitation through the right carotid with good contralateral flow. Neurologically at baseline.  Plan -CTM-Supportive  care -Maintain BP target range SBP 140-160 to maximize possible perfusion - Repeat CT angiogram  - Nimodipine  PO qd - Levitioracetam IVPB /131mL q12 oral   - Labetalol  IV q30min PRN  -Dexamethasone  IV q6hrs  DIABETES MELLITUS (POORLY CONTROLLED) - Hyperglycemia Patient historical recount of use and management of blood sugars is not consistent per coordinator. Revised plan has been implemented while on unit resulting in down trend in glucose levels, however, patient is still mildly hyperglycemic. Patient states glycemic control is managed at home. HbA1c 7.7  Plan -Consult Diabetic Coordinator appreciate recommendations -Start insulin Aspart 5 units SQ TID w meals (4/28) -Cont insulin Detemir 50 unit SQ q24hrs -CTM renal function   HTN- Controlled under protocol of targeted BP for neuro-protection.  Echo reveals diastolic dysfunction grade 1 w mild LVH and normal ejection fraction. No signs of mural thrombosis, or valvular vegetations. Aortic root dilation is present with sclerosis of valve, ejection fraction preserved .   Plan -Rosuvastatin  tab qd PO -Lisinopril  tablet qd PO -Hydralazine  IV q6hrs PRN -Fenofibrate tab  (hyperTG) -CTM      FAMILY  - Wife present at bedside  - Inter-disciplinary family meet or Palliative Care meeting due by:  day 7   Sreya Froio C. [facility]  Medical Student 01/16/2020, 7:52 AM

## 2020-01-16 NOTE — Progress Notes (Signed)
  NEUROSURGERY PROGRESS NOTE   No issues overnight.  Continued headache, neck pain and nausea.  Pain not as severe No new N/T/W  EXAM:  BP 127/69   Pulse 80   Temp 98.4 F (36.9 C) (Axillary)   Resp 19   Ht 6' (1.829 m)   Wt 90.7 kg   SpO2 91%   BMI 27.12 kg/m   Awake, alert, oriented  Speech fluent, appropriate  CN grossly intact  5/5 BUE/BLE  Access site: c/d/i, no hematoma  IMPRESSION/PLAN 69 y.o. male male angio negative SAH Hunt-Hess 2 Fisher 3/4 Day #2. Focal vasospasm involving supraclinoid right ICA and proximal right M1 and A1 with notable flow limitation through the right carotid with good contralateral flow. Neurologically at baseline - continue supportive care, nimotop - TCDs - PT/OT

## 2020-01-16 NOTE — Evaluation (Addendum)
Occupational Therapy Evaluation Patient Details Name: Nicholas [name] MRN: [id number] DOB: 07-09-1951 Today's Date: 01/16/2020    History of Present Illness 69 year old male presented to the ED today for persistent headaches that have gradually worsened since yesterday evening accompanied by nausea and vomiting. Spouse acknowledges some confusion is present. CTA demonstrating a large SAH in the basilar cisterns extending into bilateral sylvian fissures. Pt underwent cerebral angiogram on 4/27.   Clinical Impression   This 69 y/o male presents with the above. PTA pt reports very independent with ADL, iADL and functional mobility. Pt currently requiring overall minA for functional mobility within room (via HHA), as pt mildly unsteady and with slight LOB with initial standing and attempts to take steps. He currently requires minA for LB ADL, setup/supervision for seated UB ADL. Pt will benefit from continued acute OT services and currently recommend follow up [facility] services, however pending progress pt may reach point of returning home with no follow up. Anticipate pt to progress well. Will continue to follow acutely.  BP start of session 131/48 End of session seated in recliner 146/60.     Follow Up Recommendations  Home health OT;Supervision/Assistance - 24 hour(may progress to no follow up)    Equipment Recommendations  Tub/shower seat(vs none, pending progress)           Precautions / Restrictions Precautions Precautions: Fall Restrictions Weight Bearing Restrictions: No      Mobility Bed Mobility Overal bed mobility: Needs Assistance Bed Mobility: Supine to Sit     Supine to sit: Supervision        Transfers Overall transfer level: Needs assistance Equipment used: None Transfers: Sit to/from Stand Sit to Stand: Min assist         General transfer comment: initial posterior LOB once coming to standing    Balance Overall balance assessment: Needs  assistance Sitting-balance support: No upper extremity supported;Feet supported Sitting balance-Leahy Scale: Fair Sitting balance - Comments: close supervision donning socks at edge of bed   Standing balance support: Single extremity supported Standing balance-Leahy Scale: Fair Standing balance comment: minG-minA for dynamic standign balance                           ADL either performed or assessed with clinical judgement   ADL Overall ADL's : Needs assistance/impaired Eating/Feeding: Modified independent;Sitting   Grooming: Minimal assistance;Standing   Upper Body Bathing: Set up;Supervision/ safety;Sitting   Lower Body Bathing: Minimal assistance;Sit to/from stand   Upper Body Dressing : Set up;Sitting   Lower Body Dressing: Minimal assistance;Sit to/from stand Lower Body Dressing Details (indicate cue type and reason): pt able to don socks seated EOB, minA standing balance Toilet Transfer: Minimal assistance;+2 for safety/equipment;Ambulation Toilet Transfer Details (indicate cue type and reason): simulated via transfer to recliner, room level mobility Toileting- Clothing Manipulation and Hygiene: Minimal assistance;Sit to/from stand       Functional mobility during ADLs: Minimal assistance;+2 for safety/equipment General ADL Comments: pt unsteady with moblity today requiring minA, denies dizziness or vision changes     Vision Baseline Vision/History: Wears glasses Wears Glasses: At all times Patient Visual Report: Other (comment)(difficult to tell as he doesn't have his glasses here) Additional Comments: pt's spouse to bring glasses, pt tending to maintain L eye closed but he reports that's because his vision is poor without them, he denies changes in vision, no apparent deficits noted today      Perception     Praxis  Pertinent Vitals/Pain Pain Assessment: Faces Faces Pain Scale: Hurts little more Pain Location: head Pain Descriptors / Indicators:  Aching Pain Intervention(s): Monitored during session     Hand Dominance Right   Extremity/Trunk Assessment Upper Extremity Assessment Upper Extremity Assessment: Overall WFL for tasks assessed   Lower Extremity Assessment Lower Extremity Assessment: Defer to PT evaluation   Cervical / Trunk Assessment Cervical / Trunk Assessment: Normal   Communication Communication Communication: No difficulties   Cognition Arousal/Alertness: Awake/alert Behavior During Therapy: WFL for tasks assessed/performed Overall Cognitive Status: Within Functional Limits for tasks assessed                                 General Comments: may benefit from higher level asessment, WFL for basic tasks today, pt asking appropriate questinos   General Comments  VSS on RA    Exercises     Shoulder Instructions      Home Living Family/patient expects to be discharged to:: Private residence Living Arrangements: Spouse/significant other Available Help at Discharge: Family;Available 24 hours/day Type of Home: House Home Access: Stairs to enter [employer] of Steps: 3 Entrance Stairs-Rails: Right Home Layout: One level     Bathroom Shower/Tub: Chief Strategy Officer: Standard     Home Equipment: Environmental consultant - 2 wheels;Crutches;Bedside commode          Prior Functioning/Environment Level of Independence: Independent        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 Problem List: Decreased strength;Decreased activity tolerance;Impaired balance (sitting and/or standing);Impaired vision/perception;Decreased knowledge of use of DME or AE      OT Treatment/Interventions: Self-care/ADL training;Therapeutic exercise;Neuromuscular education;Energy conservation;DME and/or AE instruction;Therapeutic activities;Patient/family education;Balance training;Cognitive remediation/compensation    OT Goals(Current goals can be found in the care plan section) Acute Rehab OT  Goals Patient Stated Goal: To return to independence OT Goal Formulation: With patient Time For Goal Achievement: 01/30/20 Potential to Achieve Goals: Good  OT Frequency: Min 2X/week   Barriers to D/C:            Co-evaluation PT/OT/SLP Co-Evaluation/Treatment: Yes Reason for Co-Treatment: Complexity of the patient's impairments (multi-system involvement);For patient/therapist safety;To address functional/ADL transfers   OT goals addressed during session: ADL's and self-care      AM-PAC OT "6 Clicks" Daily Activity     Outcome Measure Help from another person eating meals?: None Help from another person taking care of personal grooming?: A Little Help from another person toileting, which includes using toliet, bedpan, or urinal?: A Little Help from another person bathing (including washing, rinsing, drying)?: A Little Help from another person to put on and taking off regular upper body clothing?: A Little Help from another person to put on and taking off regular lower body clothing?: A Little 6 Click Score: 19   End of Session Equipment Utilized During Treatment: Gait belt Nurse Communication: Mobility status  Activity Tolerance: Patient tolerated treatment well Patient left: in chair;with call bell/phone within reach;with chair alarm set  OT Visit Diagnosis: Unsteadiness on feet (R26.81);Other abnormalities of gait and mobility (R26.89)                Time: 1607-3710 OT Time Calculation (min): 33 min Charges:  OT General Charges $OT Visit: 1 Visit OT Evaluation $OT Eval Moderate Complexity: 1 Mod  Marcy Siren, OT Acute Rehabilitation Services Pager [phone] Office [phone]   Orlando Penner 01/16/2020, 2:09 PM

## 2020-01-16 NOTE — Progress Notes (Signed)
Transcranial Doppler  Date POD PCO2 HCT BP  MCA ACA PCA OPHT SIPH VERT Basilar  4/27 MS     Right  Left   197  64   *  105   12  -17   27  23    150  29   -23  -31   -36      4/28,rs     Right  Left   134  131   -31  -92   38  -24   57  50   153  136   -18  -31   -42           Right  Left                                             Right  Left                                             Right  Left                                            Right  Left                                            Right  Left                                        MCA = Middle Cerebral Artery      OPHT = Opthalmic Artery     BASILAR = Basilar Artery   ACA = Anterior Cerebral Artery     SIPH = Carotid Siphon PCA = Posterior Cerebral Artery   VERT = Verterbral Artery                   Normal MCA = 62+\-12 ACA = 50+\-12 PCA = 42+\-23  *Unable to insonate 01/15/2020- Right [place]ard ratio=7.3, left [place]ard ratio=1.68 01/16/20 - Right [place]ard ratio = 3.3, left [place]ard ratio = 6.2 Constance Whittle, BS, RDMS, RVT

## 2020-01-17 DIAGNOSIS — I67848 Other cerebrovascular vasospasm and vasoconstriction: Secondary | ICD-10-CM | POA: Diagnosis not present

## 2020-01-17 LAB — CBC
HCT: 44.1 % (ref 39.0–52.0)
Hemoglobin: 14.2 g/dL (ref 13.0–17.0)
MCH: 29.2 pg (ref 26.0–34.0)
MCHC: 32.2 g/dL (ref 30.0–36.0)
MCV: 90.6 fL (ref 80.0–100.0)
Platelets: 196 10*3/uL (ref 150–400)
RBC: 4.87 MIL/uL (ref 4.22–5.81)
RDW: 13.7 % (ref 11.5–15.5)
WBC: 17.2 10*3/uL — ABNORMAL HIGH (ref 4.0–10.5)
nRBC: 0 % (ref 0.0–0.2)

## 2020-01-17 LAB — GLUCOSE, CAPILLARY
Glucose-Capillary: 189 mg/dL — ABNORMAL HIGH (ref 70–99)
Glucose-Capillary: 183 mg/dL — ABNORMAL HIGH (ref 70–99)
Glucose-Capillary: 188 mg/dL — ABNORMAL HIGH (ref 70–99)
Glucose-Capillary: 216 mg/dL — ABNORMAL HIGH (ref 70–99)
Glucose-Capillary: 208 mg/dL — ABNORMAL HIGH (ref 70–99)

## 2020-01-17 LAB — BASIC METABOLIC PANEL
Anion gap: 9 (ref 5–15)
BUN: 23 mg/dL (ref 8–23)
CO2: 25 mmol/L (ref 22–32)
Calcium: 8.3 mg/dL — ABNORMAL LOW (ref 8.9–10.3)
Chloride: 107 mmol/L (ref 98–111)
Creatinine, Ser: 0.76 mg/dL (ref 0.61–1.24)
GFR calc Af Amer: >60 mL/min (ref >60)
GFR calc non Af Amer: >60 mL/min (ref >60)
Glucose, Bld: 182 mg/dL — ABNORMAL HIGH (ref 70–99)
Potassium: 4 mmol/L (ref 3.5–5.1)
Sodium: 141 mmol/L (ref 135–145)

## 2020-01-17 LAB — TRIGLYCERIDES: Triglycerides: 413 mg/dL — ABNORMAL HIGH (ref <150)

## 2020-01-17 LAB — PHOSPHORUS: Phosphorus: 2.8 mg/dL (ref 2.5–4.6)

## 2020-01-17 LAB — MAGNESIUM: Magnesium: 1.8 mg/dL (ref 1.7–2.4)

## 2020-01-17 MED ORDER — HYDROMORPHONE HCL 1 MG/ML IJ SOLN
0.5000 mg | INTRAMUSCULAR | Status: DC | PRN
  Administered 2020-01-17 – 2020-02-02 (×24): 0.5 mg via INTRAVENOUS
  Filled 2020-01-17 (×26): qty 1

## 2020-01-17 MED ORDER — LABETALOL HCL 5 MG/ML IV SOLN
5.0000 mg | INTRAVENOUS | Status: DC | PRN
  Administered 2020-01-17 – 2020-01-18 (×7): 5 mg via INTRAVENOUS
  Filled 2020-01-17 (×6): qty 4

## 2020-01-17 MED ORDER — OXYCODONE-ACETAMINOPHEN 5-325 MG PO TABS
1.0000 | ORAL_TABLET | ORAL | Status: DC | PRN
  Administered 2020-01-17 – 2020-02-02 (×34): 1 via ORAL
  Filled 2020-01-17 (×36): qty 1

## 2020-01-17 NOTE — Progress Notes (Signed)
PULMONARY / CRITICAL CARE MEDICINE   Name: Nicholas [name] MRN: [id number] DOB: 03/05/1951    ADMISSION DATE:  01/14/2020 CONSULTATION DATE:  01/14/20   CHIEF COMPLAINT: Change in mental status, acute severe HA and neck pain  HISTORY OF PRESENT ILLNESS: LOS 3    35yr M presented for persistent headaches which gradually worsened. The patient stated that after dinner the headache was suddenly extremely intense and agonizing. Patient has a history of migranes and stated this was unlike any migraine he had previously which is typically after a serious of visual auras and photophobia. There was no prodrome symptoms that suggested a migraine to the patient. On presentation the patients wife was the main historian.   When the patient woke up the next morning he stated he was not feeling well, with nausea and vomiting. Patient was transported by EMS to [facility] for imaging and transferred to [facility] for further evaluation.  4/28- morning when discussed progression of symptoms, the patient stated he felt this was due to malpositioning of his neck while in bed and this is what caused the severe neck and head pain. He described obvious meningeal signs such as neck irritation which was more pronounced when moving his neck in any position.  No numbness, tingling, or weakness of the extremities.     4/30- Pt has been informed angiography had to be repeated as his angiography was inconclusive. Pt has stated he has constant headache that ranges between 2-5/10 in intensity. Furthermore the patient is concerned that he gets hiccups while taking sips of his water/juice.   PAST MEDICAL HISTORY :  He  has a past medical history of BPH (benign prostatic hypertrophy) (12/20/2011), Colon polyp, Deviated septum, Diabetes mellitus without complication (HCC), Hepatitis A, HSV-1 (herpes simplex virus 1) infection, Hyperlipidemia, Hypertension, and Migraine headache.  PAST SURGICAL HISTORY: He  has a past surgical  history that includes Nasal septum surgery; IR ANGIO INTRA EXTRACRAN SEL INTERNAL CAROTID BILAT MOD SED (01/15/2020); IR ANGIO VERTEBRAL SEL VERTEBRAL BILAT MOD SED (01/15/2020); IR ANGIO EXTERNAL CAROTID SEL EXT CAROTID BILAT MOD SED (01/15/2020); and Radiology with anesthesia (N/A, 01/15/2020).  No Known Allergies  No current facility-administered medications on file prior to encounter.   Current Outpatient Medications on File Prior to Encounter  Medication Sig  . aspirin (ASPIRIN ADULT LOW STRENGTH) 81 MG EC tablet Take 81 mg by mouth daily. Swallow whole.  Marland Kitchen atorvastatin (LIPITOR) 80 MG tablet Take 1 tablet (80 mg total) by mouth daily.  . cholecalciferol (VITAMIN D3) 25 MCG (1000 UNIT) tablet Take 1,000 Units by mouth daily.  . CRESTOR 40 MG tablet Take 1 tablet (40 mg total) by mouth daily.  . fenofibrate 160 MG tablet Take 1 tablet (160 mg total) by mouth daily.  . insulin aspart (NOVOLOG) 100 UNIT/ML FlexPen Inject 75 Units into the skin daily.   . insulin degludec (TRESIBA) 200 UNIT/ML FlexTouch Pen Inject 35 Units into the skin daily.   . insulin detemir (LEVEMIR) 100 UNIT/ML FlexPen Inject 20 Units into the skin See admin instructions. Sliding scale : Takes 15 units if blood sugar more than 300. Max 20 units.  Marland Kitchen lisinopril (PRINIVIL,ZESTRIL) 20 MG tablet Take 1 tablet (20 mg total) by mouth daily.  Marland Kitchen XIGDUO XR 06-999 MG TB24 Take 1 tablet by mouth daily.  Marland Kitchen glipiZIDE (GLUCOTROL XL) 10 MG 24 hr tablet Take 1 tablet (10 mg total) by mouth daily with breakfast. (Patient not taking: Reported on 01/14/2020)  . omega-3 acid  ethyl esters (LOVAZA) 1 G capsule Take 2 capsules (2 g total) by mouth 2 (two) times daily. (Patient not taking: Reported on 01/14/2020)  . pioglitazone (ACTOS) 45 MG tablet Take 1 tablet (45 mg total) by mouth daily. (Patient not taking: Reported on 01/14/2020)  . sitaGLIPtin-metformin (JANUMET) 50-1000 MG per tablet Take 1 tablet by mouth 2 (two) times daily with a meal.  (Patient not taking: Reported on 01/14/2020)    FAMILY HISTORY:  His He indicated that his mother is alive. He indicated that his father is deceased. He indicated that his sister is alive. He indicated that his brother is alive.   SOCIAL HISTORY: He  reports that he has never smoked. He has never used smokeless tobacco. He reports current alcohol use. He reports that he does not use drugs.   VITAL SIGNS: BP (!) 160/84   Pulse 78   Temp 98.9 F (37.2 C) (Oral)   Resp 15   Ht 6' (1.829 m)   Wt 90.7 kg   SpO2 92%   BMI 27.12 kg/m   HEMODYNAMICS: N/A     VENTILATOR SETTINGS: N/A    INTAKE / OUTPUT: I/O last 3 completed shifts: In: 6205 [P.O.:1480; I.V.:4625; IV Piggyback:100] Out: 2875 [Urine:2875]  PHYSICAL EXAMINATION: Physical Exam Vitals reviewed.      General Appearance: Alert, cooperative, no distress, appears stated age Head: Normocephalic, without obvious abnormality, atraumatic Eyes: PERRL, conjunctiva/corneas clear, EOM's intact, fundi benign, both eyes      Lungs:  respirations unlabored, no pathologic lung sounds noted.  Heart: Regular rate and rhythm  NEUROLOGIC:   Mental status: A&O x4, no aphasia, good attention span, Memory and fund of knowledge Motor Exam - grossly normal. Moves all extremities with appropriate strength Sensory Exam - grossly normal Reflexes: symmetric, no pathologic reflexes, No Hoffman's, No clonus Coordination - grossly normal Gait - not tested Balance -not tested Cranial Nerves: CN motor function normal, mild right sided tongue deviation on protrusion, uvula midline. PEERLA, EOM intact, Hearing intact, facial motor function in tact.   LABS:  BMET Recent Labs  Lab 01/14/20 1021 01/14/20 1021 01/14/20 1022 01/15/20 0452 01/16/20 0416  NA 139   < > 141 145 143  K 3.9   < > 3.9 3.8 4.3  CL 101  --   --  112* 109  CO2 20*  --   --  23 22  BUN 27*  --   --  35* 41*  CREATININE 1.13  --   --  1.18 1.00  GLUCOSE 343*   --   --  238* 135*   < > = values in this interval not displayed.    Electrolytes Recent Labs  Lab 01/14/20 1021 01/15/20 0452 01/16/20 0416  CALCIUM 9.7 9.5 8.9  MG  --  2.2 2.1  PHOS  --  1.4* 3.6    CBC Recent Labs  Lab 01/15/20 0452 01/16/20 0416 01/17/20 0250  WBC 12.1* 23.8* 17.2*  HGB 15.2 14.2 14.2  HCT 45.6 43.4 44.1  PLT 256 231 196    Coag's Recent Labs  Lab 01/14/20 1022  APTT 27  INR 1.1    Sepsis Markers Recent Labs  Lab 01/14/20 1021 01/14/20 1230  LATICACIDVEN 3.2* 2.7*    ABG No results for input(s): PHART, PCO2ART, PO2ART in the last 168 hours.  Liver Enzymes Recent Labs  Lab 01/14/20 1021  AST 28  ALT 41  ALKPHOS 44  BILITOT 1.3*  ALBUMIN 5.2*    Cardiac Enzymes  No results for input(s): TROPONINI, PROBNP in the last 168 hours.  Glucose Recent Labs  Lab 01/16/20 1121 01/16/20 1519 01/16/20 1924 01/16/20 2315 01/17/20 0315 01/17/20 0731  GLUCAP 201* 176* 189* 228* 189* 183*    Imaging VAS [place] TRANSCRANIAL DOPPLER  Result Date: 01/16/2020  Transcranial Doppler Indications: Subarachnoid hemorrhage. History: ?? Intracranial aneurysms - significant vasospasm involving the distal supraclinoid right ICA and proximal right M1 on 4/27 IR angio. Performing Technologist: Marilynne Halsted RDMS, RVT  Examination Guidelines: A complete evaluation includes B-mode imaging, spectral Doppler, color Doppler, and power Doppler as needed of all accessible portions of each vessel. Bilateral testing is considered an integral part of a complete examination. Limited examinations for reoccurring indications may be performed as noted.  +----------+-------------+----------+-----------+-------+ RIGHT TCD Right VM (cm)Depth (cm)PulsatilityComment +----------+-------------+----------+-----------+-------+ MCA          134.00      54.00       1.1            +----------+-------------+----------+-----------+-------+ ACA          -31.00                   1.1            +----------+-------------+----------+-----------+-------+ Term ICA      53.00                  .72            +----------+-------------+----------+-----------+-------+ PCA           38.00                  1.5            +----------+-------------+----------+-----------+-------+ Opthalmic     57.00                  2.2            +----------+-------------+----------+-----------+-------+ ICA siphon   153.00      69.00      0.87            +----------+-------------+----------+-----------+-------+ Vertebral    -18.00                                 +----------+-------------+----------+-----------+-------+ Distal ICA    40.00                                 +----------+-------------+----------+-----------+-------+  +----------+------------+----------+-----------+------------------+ LEFT TCD  Left VM (cm)Depth (cm)Pulsatility     Comment       +----------+------------+----------+-----------+------------------+ MCA          131.00     81.00       1.0    contralateral side +----------+------------+----------+-----------+------------------+ ACA          -92.00     60.00       1.0                       +----------+------------+----------+-----------+------------------+ Term ICA     37.00                  1.4                       +----------+------------+----------+-----------+------------------+ PCA          24.00  1.1            P2         +----------+------------+----------+-----------+------------------+ Opthalmic    50.00                  1.7                       +----------+------------+----------+-----------+------------------+ ICA siphon   136.00     73.00       1.0                       +----------+------------+----------+-----------+------------------+ Vertebral    -31.00                                           +----------+------------+----------+-----------+------------------+ Distal ICA    21.00                                            +----------+------------+----------+-----------+------------------+  +------------+-------+-------------+             VM cm/s   Comment    +------------+-------+-------------+ Prox Basilar-42.00               +------------+-------+-------------+ Dist Basilar       not insonated +------------+-------+-------------+ +----------------------+---+ Right Lindegaard Ratio3.3 +----------------------+---+ +---------------------+---+ Left Lindegaard Ratio6.2 +---------------------+---+  Summary:  Elevated right middle cerebral and carotid siphon and left middle cerebral artery and carotid siphon mean flow velocities suggest mild vasospasm. Slight improvement compared with prior TCD study 01/14/20 *See table(s) above for TCD measurements and observations.  Diagnosing physician: Delia HeadyPramod Sethi MD Electronically signed by Delia HeadyPramod Sethi MD on 01/16/2020 at 5:31:27 PM.    Final      STUDIES:  MRSA PCR neg Blood and urine cultures pending  CULTURES: Negative Viral (influenza A/B and COVID)   ANTIBIOTICS: (D/C) Cefepime 2g IV q8hr Vanc 2000mg  IV q24hr Monitor renal function and dosing as needed   SIGNIFICANT EVENTS: No acute events overnight  LINES/TUBES: Peripheral IV 01/15/20 Anterior;Proximal;Right Forearm 2 days  Peripheral IV 01/15/20 Left Wrist 2 days  [REMOVED] Peripheral IV 01/14/20 Right Forearm less than 1 day  [REMOVED] Peripheral IV 01/14/20 Right Hand less than 1 day  [REMOVED] Peripheral IV 01/15/20 Right Forearm    Active Patient/Airways  None      DISCUSSION:  Primary concern for the patient is diagnosis and further management of SAH. The patient is stable with full mental capacity and no neurologic deficit. Pain has been managed and the patient has been pleasant and answering questions. Motor function is normal, no spasticity is noted. [facility].  Patient is to remain on fall  precautions   ASSESSMENT / PLAN: 4/29- Patient has not had any acute changes overnight. Pt has remained afebrile, denies any acute onset severe pain in chest, head or periphery. Pt has been urinating without difficulty and denies any SOB, dysphagia or changes in vision. Pt has had steady increase in BUN with its largest increase occurring between 2 most recent measurements. The pt did have contrast angiogram yesterday which can explain further increase as all other electrolytes levels have normalized. There is a slight increase in patients serum AG (currently 12), however, in conjunction with elevated BUN and high normal range  creatinine post angiography can indicate transient mild contrast induced nephropathy. This is worth mentioning due to underlying DM, HTN, HLD and new status suggesting pt blood glucose is not as controlled as pt described per A1c.   -4/30- No acute events overnight. Pt headache remains stable with some intermittent increases after changes in position. Pt stated that the pain never went above 5/10 but did express he would like some pain medication PRN in the event of intermittent increases in pain. Pt had PRN Diludad for pain but did not take. Recent changes with insulin managements has continued to down trend blood glucose readings. BP has been maintained in range with current regiment and no changes have been made. Limiting IV fluid administration would be ideal in order to maintain proper volume status and BP.  Orders - D/C hydromorphone - D/C pantoprazole 40mg  tab  - Start oxycodone-percocet 5-325mg  tab 14hrs PO PRN pain - Cont acetaminophen 650mg  q4hrs PRN pain - Cont Ondansetron Mg IV PRN nausea/vomiting - blood glucose monitoring - Diabetic Coordinator consult   SUBARACHNOID HEMORRHAGE CT on admission demonstrated diffuse SAH within basal cisterns sylvian fissure, and interhemispheric fissure. 4/27 Focal vasospasm involving supraclinoid right ICA and proximal right M1  and A1 with notable flow limitation through the right carotid with good contralateral flow. Neurologically at baseline.  Plan -CTM-Supportive care/Neuro checks -Maintain BP target range SBP 140-160 to maximize possible perfusion - Trend Transcranial US doppler studies - Repeat CT angiogram per recommendation - Nimodipine 60mg  PO qd - Levitioracetam IVPB 500mg /128mL q12 oral   - Labetalol 5mg  IV q65min PRN  - Dexamethasone 10mg  IV q6hrs  DIABETES MELLITUS (CONTROLLED) - Hyperglycemia Patient historical recount of use and management of blood sugars is not consistent per coordinator. Revised plan has been implemented while on unit resulting in down trend in glucose levels, however, patient is still mildly hyperglycemic. Patient states glycemic control is managed at home. HbA1c 7.7  Plan -Consult Diabetic Coordinator appreciate recommendations -Cont insulin Aspart 0-20units SQ TID w meals (4/28) -Cont insulin Aspart 5units SQ TID w meals (4/28) -Cont insulin Detemir 50 unit SQ q24hrs (4/27)    HTN- Controlled under protocol of targeted BP for neuro-protection.  Echo reveals diastolic dysfunction grade 1 w mild LVH and normal ejection fraction. No signs of mural thrombosis, or valvular vegetations. Aortic root dilation is present with sclerosis of valve, ejection fraction preserved .  Plan -Rosuvastatin 40mg  tab qd PO -Lisinopril 20mg  tablet qd PO -Hydralazine 10mg  IV q6hrs PRN -Fenofibrate tab 160mg  (hyperTG) -CTM    FAMILY  - Wife present at bedside  - Inter-disciplinary family meet or Palliative Care meeting due by:  day [place]. [place] Student 01/17/2020, 7:58 AM

## 2020-01-17 NOTE — Progress Notes (Signed)
Palliative Care Consultation Reason: Supportive Services, Advance Care Planning  69 yo man s/p non-traumatic SAH, DM2 and hypertension. Currently stable with angiogram negative for aneurysm but notable vasospasm. He has a severe HA and neck pain. Cognition seems to be intact.  I met with patient, his wife and his son who requested palliative and supportive services in the setting of acute serious illness. I allowed for time and space for them to discuss events and their impact on them mentally and emotionally.  Supported excellent care they are receiving from neurosurgical team.   They expressed an interest in [place], he has no documents in the ACP platform in [facility]. They completed documents many years ago-these need to be updated. They tell me this event has reminded them about how important these conversations are to them. I provided information on the My Chart ACP tool for them to review.  Recommendations 1. Supportive Care and Counseling as needed 2. Symptom Management per primary service in the context of acute neurological event. I added some simethecone (he complained of new onset dramatic belching and bloating, reflux). He is on PPI. 3. ACP: Once patient is able to fully participate will assist with document completion.  Will see only as needed and follow up with ACP prior to discharge.  Time: 30 min Greater than 50%  of this time was spent counseling and coordinating care related to the above assessment and plan.  Lane Hacker, DO Palliative Medicine

## 2020-01-17 NOTE — Progress Notes (Signed)
Pt walked 2 laps around unit with RN without complications.  Pt complaining of worsened HA.  Notified Dr. Denese Killings with CCM.  New orders for prn dilauded.  Pt resting comfortably now.

## 2020-01-17 NOTE — Progress Notes (Signed)
Per Dr. Conchita Paris, OK with sbp greater than 160.  Told by MD to treat when >170.

## 2020-01-17 NOTE — Progress Notes (Signed)
Physical Therapy Treatment Patient Details Name: Nicholas [name] MRN: [id number] DOB: 07-Dec-1950 Today's Date: 01/17/2020    History of Present Illness 69 year old male presented to the ED today for persistent headaches that have gradually worsened since yesterday evening accompanied by nausea and vomiting. Spouse acknowledges some confusion is present. CTA demonstrating a large SAH in the basilar cisterns extending into bilateral sylvian fissures. Pt underwent cerebral angiogram on 4/27.    PT Comments    Pt tolerated treatment well with improved balance and activity tolerance. Pt does continue to demonstrate dynamic gait and balance deficits with 2 minor LOB when performing head turns while ambulating. Pt with improved step length and foot clearance this session. Pt reports improved vision with use of glasses this session, likely contributing to improvements in balance. Pt will continue to benefit from aggressive mobilization and PT POC to aide in a return to independent mobility.   Follow Up Recommendations  Outpatient PT;Supervision/Assistance - 24 hour     Equipment Recommendations  None recommended by PT    Recommendations for Other Services       Precautions / Restrictions Precautions Precautions: Fall Precaution Comments: SBP<140 Restrictions Weight Bearing Restrictions: No    Mobility  Bed Mobility Overal bed mobility: Needs Assistance Bed Mobility: Supine to Sit     Supine to sit: Supervision        Transfers Overall transfer level: Needs assistance Equipment used: None Transfers: Sit to/from Stand Sit to Stand: Min guard            Ambulation/Gait Ambulation/Gait assistance: Min guard Gait Distance (Feet): 400 Feet Assistive device: None Gait Pattern/deviations: Step-through pattern Gait velocity: functional Gait velocity interpretation: 1.31 - 2.62 ft/sec, indicative of limited community ambulator General Gait Details: pt with slowed gait initially,  increasing speed with increased ambulation distances. Pt performing lateral and vertical head turns with 2 LOB, able to minimally change gait speeds at this time   Stairs             Wheelchair Mobility    Modified Rankin (Stroke Patients Only) Modified Rankin (Stroke Patients Only) Pre-Morbid Rankin Score: No symptoms Modified Rankin: Moderately severe disability     Balance Overall balance assessment: Needs assistance Sitting-balance support: No upper extremity supported;Feet supported Sitting balance-Leahy Scale: Good Sitting balance - Comments: supervision   Standing balance support: No upper extremity supported Standing balance-Leahy Scale: Fair Standing balance comment: close supervision                            Cognition Arousal/Alertness: Awake/alert Behavior During Therapy: WFL for tasks assessed/performed Overall Cognitive Status: Within Functional Limits for tasks assessed                                        Exercises      General Comments General comments (skin integrity, edema, etc.): pt hypertensive to 179/78 after ambulation, RN notified      Pertinent Vitals/Pain Pain Assessment: Faces Faces Pain Scale: Hurts little more Pain Location: head Pain Descriptors / Indicators: Aching Pain Intervention(s): Monitored during session    Home Living                      Prior Function            PT Goals (current goals can now be found in  the care plan section) Acute Rehab PT Goals Patient Stated Goal: To return to independence Progress towards PT goals: Progressing toward goals    Frequency    Min 4X/week      PT Plan Current plan remains appropriate    Co-evaluation              AM-PAC PT "6 Clicks" Mobility   Outcome Measure  Help needed turning from your back to your side while in a flat bed without using bedrails?: None Help needed moving from lying on your back to sitting on the  side of a flat bed without using bedrails?: None Help needed moving to and from a bed to a chair (including a wheelchair)?: A Little Help needed standing up from a chair using your arms (e.g., wheelchair or bedside chair)?: A Little Help needed to walk in hospital room?: A Little Help needed climbing 3-5 steps with a railing? : A Lot 6 Click Score: 19    End of Session   Activity Tolerance: Patient tolerated treatment well Patient left: in bed;with call bell/phone within reach;with bed alarm set;with family/visitor present Nurse Communication: Mobility status PT Visit Diagnosis: Unsteadiness on feet (R26.81);Other abnormalities of gait and mobility (R26.89)     Time: 9767-3419 PT Time Calculation (min) (ACUTE ONLY): 19 min  Charges:  $Gait Training: 8-22 mins                    Zenaida Niece, PT, DPT Acute Rehabilitation Pager: [phone]    Zenaida Niece 01/17/2020, 12:56 PM

## 2020-01-18 ENCOUNTER — Inpatient Hospital Stay (HOSPITAL_COMMUNITY): Payer: BC Managed Care – PPO

## 2020-01-18 ENCOUNTER — Encounter (HOSPITAL_COMMUNITY): Payer: Self-pay | Admitting: Anesthesiology

## 2020-01-18 ENCOUNTER — Other Ambulatory Visit (HOSPITAL_COMMUNITY): Payer: BC Managed Care – PPO

## 2020-01-18 ENCOUNTER — Inpatient Hospital Stay (HOSPITAL_COMMUNITY): Payer: BC Managed Care – PPO | Admitting: Registered Nurse

## 2020-01-18 ENCOUNTER — Encounter (HOSPITAL_COMMUNITY)
Admission: EM | Disposition: A | Discharge status: Rehabilitation Facility | Payer: Self-pay | Source: Home / Self Care | Attending: Neurosurgery

## 2020-01-18 DIAGNOSIS — J9601 Acute respiratory failure with hypoxia: Secondary | ICD-10-CM | POA: Diagnosis not present

## 2020-01-18 HISTORY — PX: IR ENDOVASC INTRACRANIAL INF OTHER THAN THROMBO ART INC DIAG ANGIO: IMG6088

## 2020-01-18 HISTORY — PX: IR PTA VASOSPASM INITIAL: IMG2346

## 2020-01-18 HISTORY — PX: IR ENDOVASC INTRACRANIAL INF OTHER THAN THROMBO ART INC DIAG ANGIO EA ADD: IMG6089

## 2020-01-18 HISTORY — PX: RADIOLOGY WITH ANESTHESIA: SHX6223

## 2020-01-18 HISTORY — PX: IR PTA VASOSPASM ADD DIFF: IMG2348

## 2020-01-18 LAB — GLUCOSE, CAPILLARY
Glucose-Capillary: 243 mg/dL — ABNORMAL HIGH (ref 70–99)
Glucose-Capillary: 168 mg/dL — ABNORMAL HIGH (ref 70–99)
Glucose-Capillary: 167 mg/dL — ABNORMAL HIGH (ref 70–99)
Glucose-Capillary: 183 mg/dL — ABNORMAL HIGH (ref 70–99)
Glucose-Capillary: 182 mg/dL — ABNORMAL HIGH (ref 70–99)
Glucose-Capillary: 177 mg/dL — ABNORMAL HIGH (ref 70–99)

## 2020-01-18 LAB — COOXEMETRY PANEL
Carboxyhemoglobin: 1.4 % (ref 0.5–1.5)
Methemoglobin: 0.4 % (ref 0.0–1.5)
O2 Saturation: 98.5 %
Total hemoglobin: 13.4 g/dL (ref 12.0–16.0)

## 2020-01-18 IMAGING — CT CT ANGIO HEAD
4 of 10 series · 12 of 47 positions shown · IV contrast (Omni 300)
Comparison: [DATE]

CLINICAL DATA: Vasospasm, subarachnoid hemorrhage

EXAM:
CT ANGIOGRAPHY HEAD
TECHNIQUE: Multidetector CT imaging of the head was performed using the
standard protocol during bolus administration of intravenous
contrast. Multiplanar CT image reconstructions and MIPs were
obtained to evaluate the vascular anatomy.
CONTRAST:  50mL OMNIPAQUE IOHEXOL 350 MG/ML SOLN

[Series 6: head bone · axial · 0.41mm/px · z∈[+1401,+1503]mm · 4 of 86 slices shown]
[im 18/86  bone]
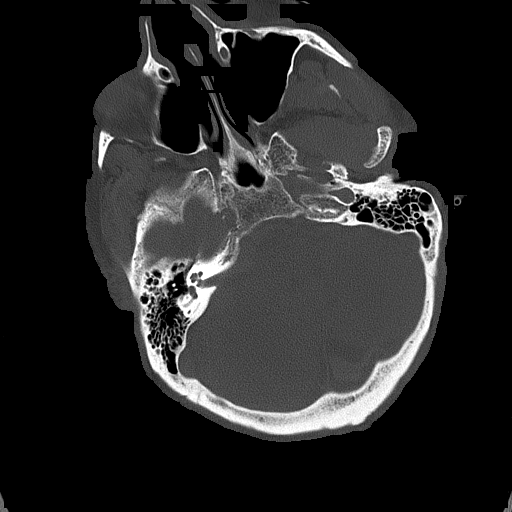
[im 35/86  bone]
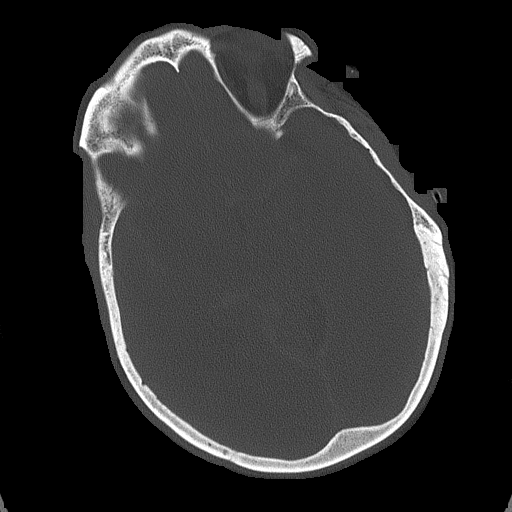
[im 52/86  bone]
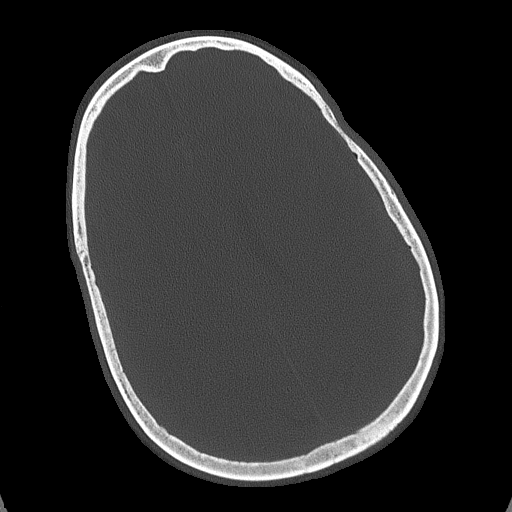
[im 69/86  bone]
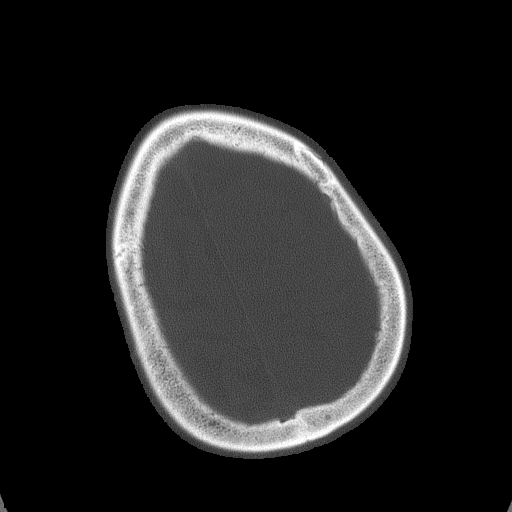

[Series 7: coronal · coronal · 0.33mm/px · 3 of 68 slices shown]
[im 14/68  brain]
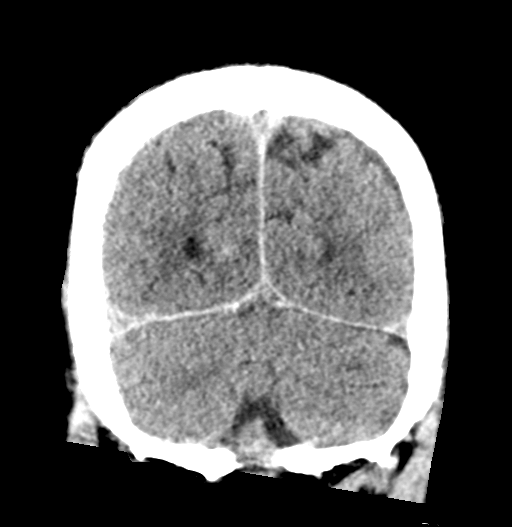
[im 27/68  brain]
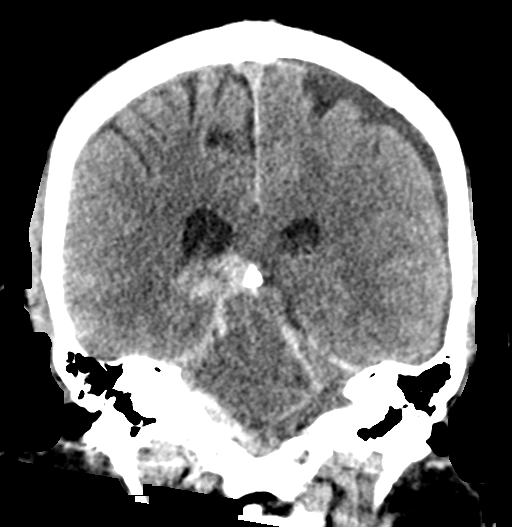
[im 41/68  brain]
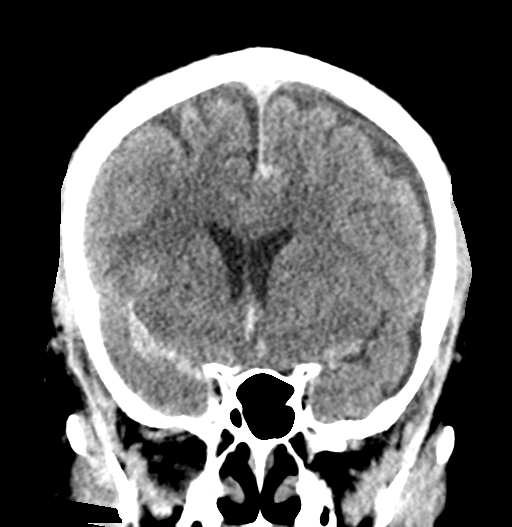

[Series 8: sagittal · sagittal · 0.34mm/px · 1 of 58 slices shown]
[im 16/58  brain]
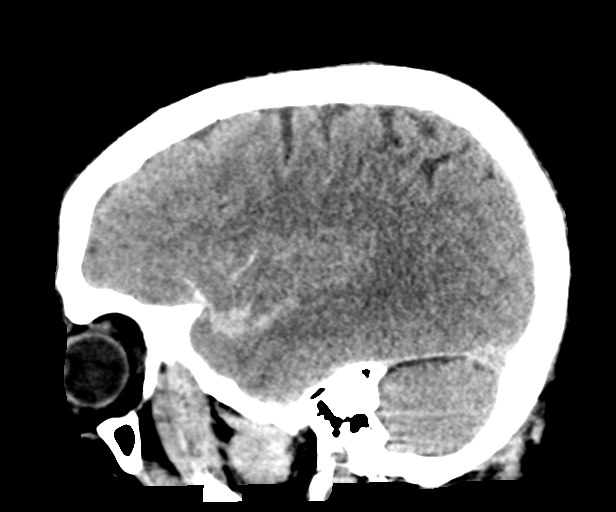

[Series 9: cow 2.0 · axial · 0.45mm/px · z∈[+1380,+1482]mm · 4 of 85 slices shown]
[im 17/85  brain]
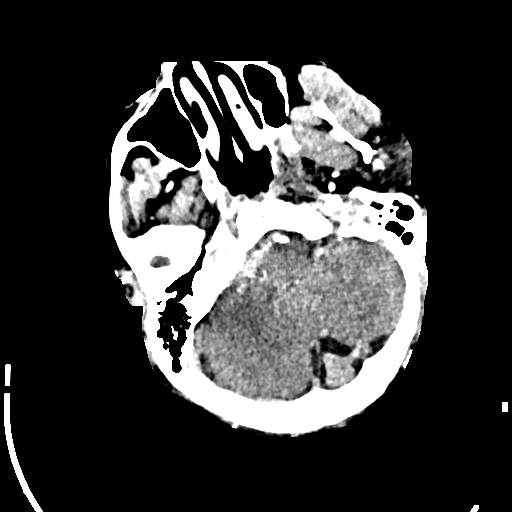
[im 34/85  bone]
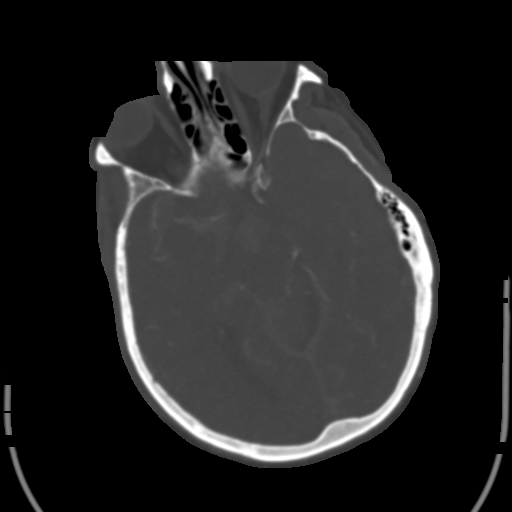
[im 51/85  brain]
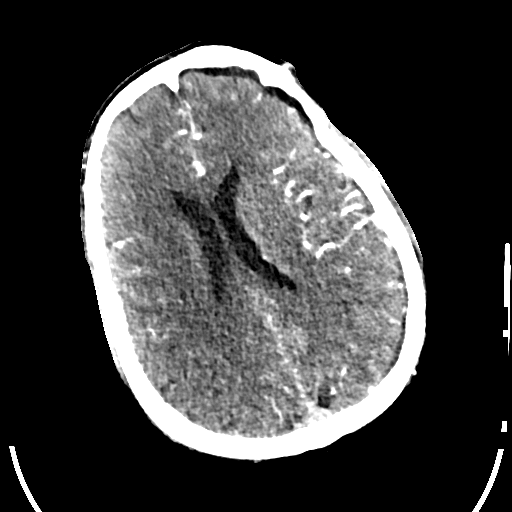
[im 68/85  bone]
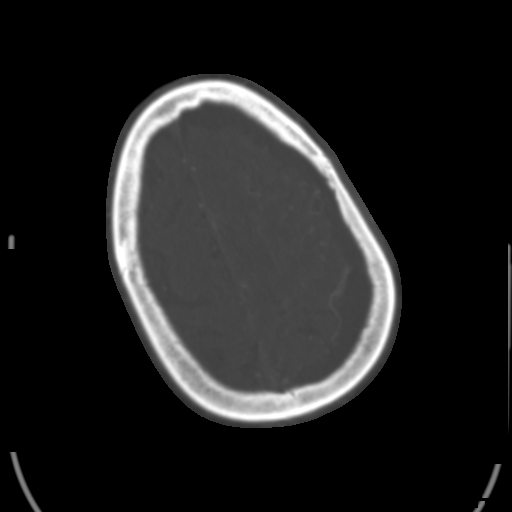

[12 of 47 positions shown; findings below may reference images not displayed]

FINDINGS: CT HEAD

Brain: Subarachnoid hemorrhage is present within the basal cisterns
eccentric to the right extending along the interhemispheric fissure
and right sylvian fissure. There is some sulcal subarachnoid
hemorrhage as well as hemorrhage within the cisterna magna. New
hypoattenuating left subdural collection is present measuring 4 mm
in thickness. Extra-axial hyperdense hemorrhage is present along the
right tentorium. Overall volume of hyperdense hemorrhage has
decreased since the prior study.

Ventricles have decreased in size and there is no hydrocephalus. New
mild rightward midline shift measuring 6 mm. No new loss of
gray-white differentiation.

Vascular: There is intracranial atherosclerotic calcification at the
skull base.

Skull: Unremarkable.

Sinuses: Minor mucosal thickening.

Orbits: Unremarkable.

CTA HEAD

Anterior circulation: Significantly diminished flow within the
visualized right internal carotid artery with no discernible
enhancement at and beyond the cavernous segment. Right middle and
anterior cerebral arteries are patent. Mildly decreased caliber of
the right M1 MCA. Diminished distal right MCA branch filling. Left
intracranial internal carotid is patent with calcified plaque
causing mild stenosis. Left anterior and middle cerebral arteries
are patent. There is narrowing of the left A1 ACA with severe
stenosis near the origin.

Posterior circulation: Intracranial vertebral arteries are patent.
Posterior inferior cerebellar arteries are patent basilar artery is
patent with diffuse irregularity of the distal portion, which is
present on the prior study. However, there is now superimposed
severe stenosis. Superior cerebellar arteries are patent with
probable stenosis. Posterior cerebral arteries are patent. There is
fetal origin of the left posterior cerebral artery.

Venous sinuses: Not well evaluated
IMPRESSION: Overall decreased volume of subarachnoid hemorrhage since
[DATE]. New left subdural hygroma and subcentimeter rightward
midline shift. Resolution of hydrocephalus.

Further significantly diminished flow within the right ICA with no
enhancement visualized at and beyond the cavernous segment. Mild
increased right M1 MCA narrowing and diminished distal right MCA
branch filling. New narrowing of the left A1 ACA with severe
stenosis near the origin.

Persistent irregularity of the distal basilar artery with new
superimposed severe stenosis.

## 2020-01-18 IMAGING — DX DG CHEST 1V PORT
1 series · 1 of 1 positions shown · non-contrast
Comparison: [KG] hours earlier today.

CLINICAL DATA: 68-year-old male central line placement.

EXAM:
PORTABLE CHEST 1 VIEW

[chest ap]
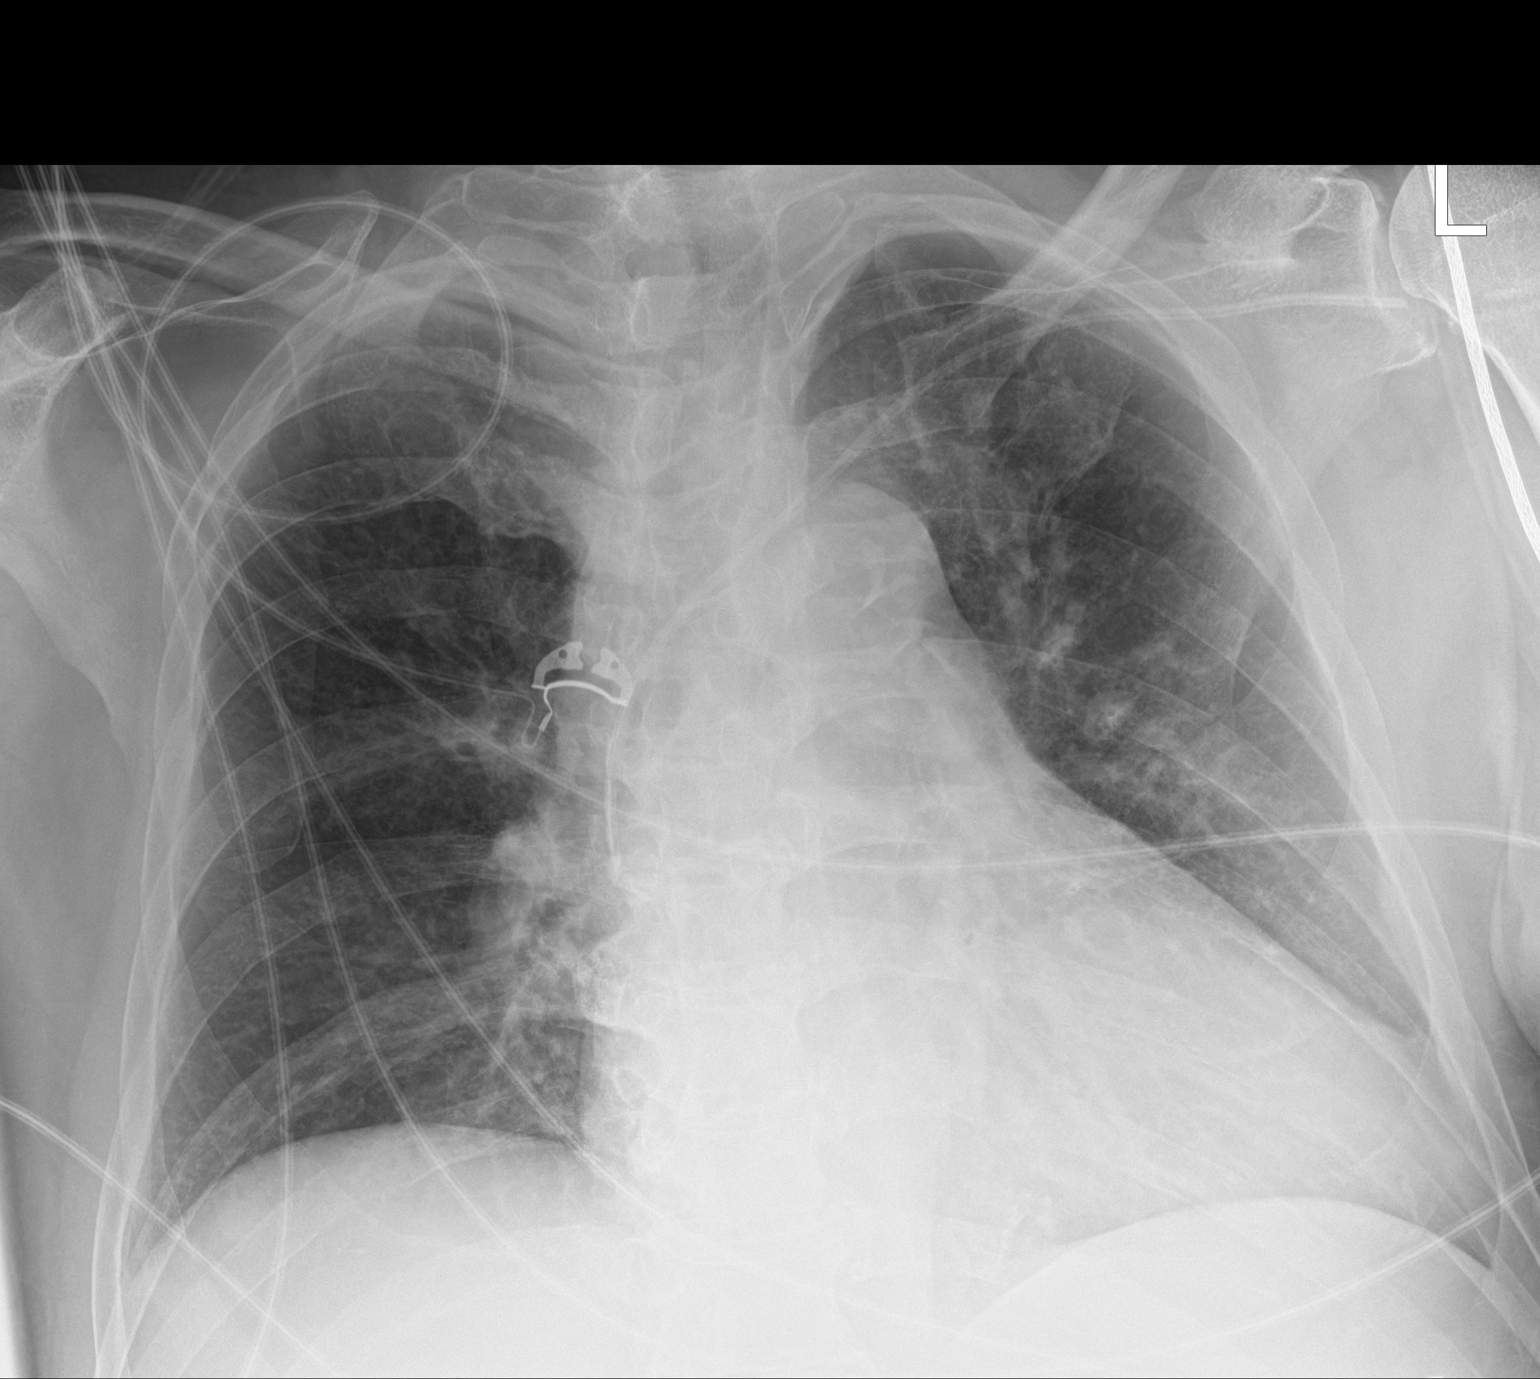

[1 of 1 positions shown; findings below may reference images not displayed]

FINDINGS: Portable AP semi upright view at [KG] hours. Left subclavian
approach central line now in place, tip at the lower SVC level.
Stable cardiac size and mediastinal contours. No pneumothorax.
Allowing for portable technique the lungs appear clear. Visualized
tracheal air column is within normal limits. No acute osseous
abnormality identified.
IMPRESSION: 1. Left subclavian approach central line placed, tip at the lower
SVC level.
2. No pneumothorax or acute cardiopulmonary abnormality.

## 2020-01-18 IMAGING — XA IR ENDOVASCULAR INTRACRANIAL INFUSION OTHER THAN THROMBOLYSIS AR
9 of 13 series · 10 of 24 positions shown · IV contrast (IODINE)
Comparison: none

PROCEDURE:
DIAGNOSTIC CEREBRAL ANGIOGRAM

BALLOON ANGIOPLASTY OF RIGHT INTERNAL CAROTID ARTERY
BALLOON ANGIOPLASTY OF RIGHT MIDDLE CEREBRAL ARTERY
BALLOON ANGIOPLASTY OF BASILAR ARTERY
CHEMICAL ANGIOPLASTY OF RIGHT INTERNAL CAROTID ARTERY
CHEMICAL ANGIOPLASTY OF LEFT INTERNAL CAROTID ARTERY
HISTORY: The patient is a 68-year-old man on his fourth hospital day admitted
for subarachnoid hemorrhage. He initially underwent angiogram a few
days ago which was negative for intracranial aneurysm however at
that time did demonstrate significant primarily right-sided
supraclinoid internal carotid artery vasospasm. The patient has been
monitored in the intensive care unit with stable neurologic exam
until this morning when he was noted to have progressively worsening
left hemiplegia, hemi neglect, and gaze preference to the right. CT
angiogram revealed near occlusion of the right internal carotid
artery and significant basilar spasm. He therefore presents
emergently for diagnostic cerebral angiogram and likely angioplasty.
TECHNIQUE: CATHETERS AND WIRES
5-French envoy MPD guide catheter

[Series 1: cerebral care 2 · 2 acquisitions, 1 frame shown (1 of 8)]
[im 1/2]
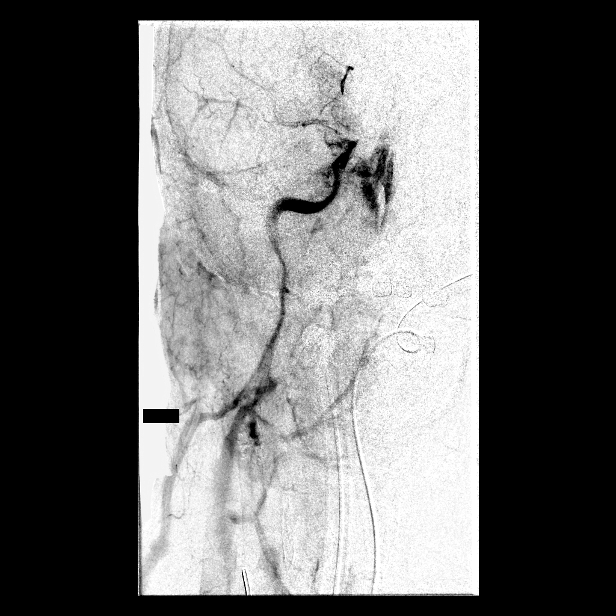

[Series 3: cerebral care 2 · 2 acquisitions, 1 frame shown (2 of 8)]
[im 1/2]
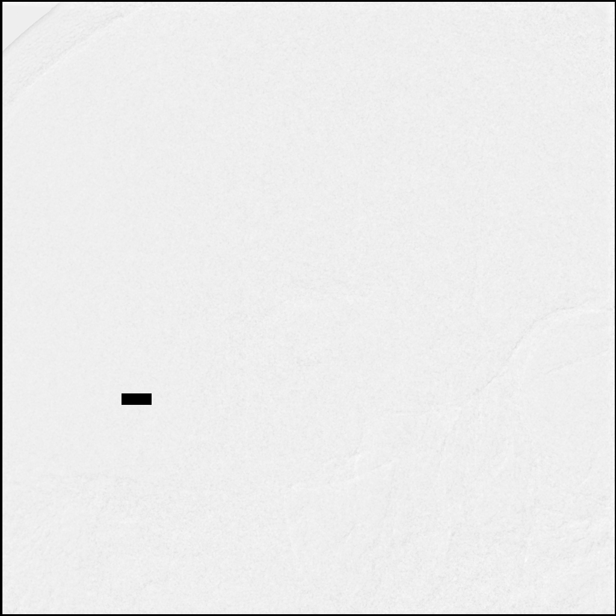

[Series 5: cerebral care 2 · 2 acquisitions, 1 frame shown (3 of 8)]
[im 1/2]
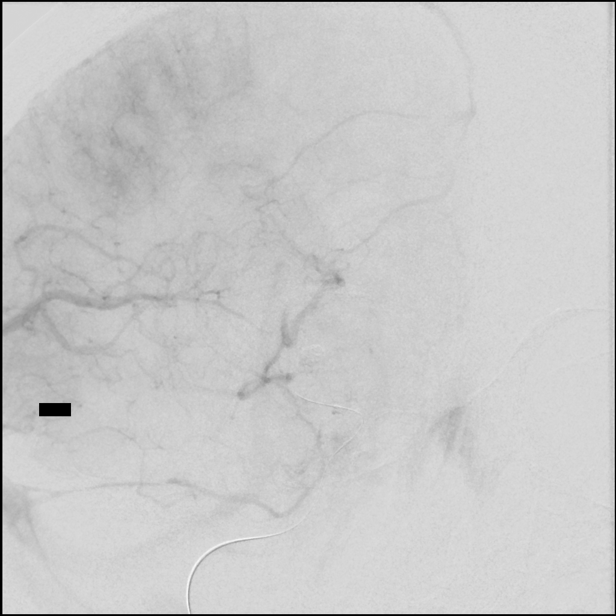

[Series 8: cerebral care 2 · 2 acquisitions, 1 frame shown (4 of 8)]
[im 1/2]
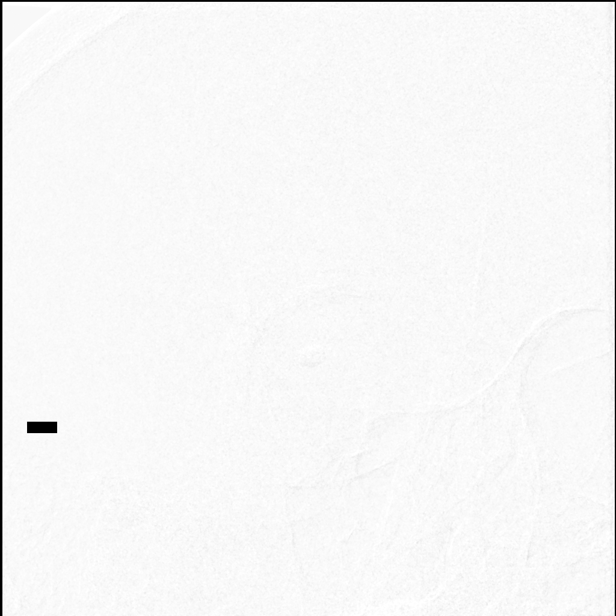

[Series 9: cerebral care 2 · 2 acquisitions, 1 frame shown (5 of 8)]
[im 1/2]
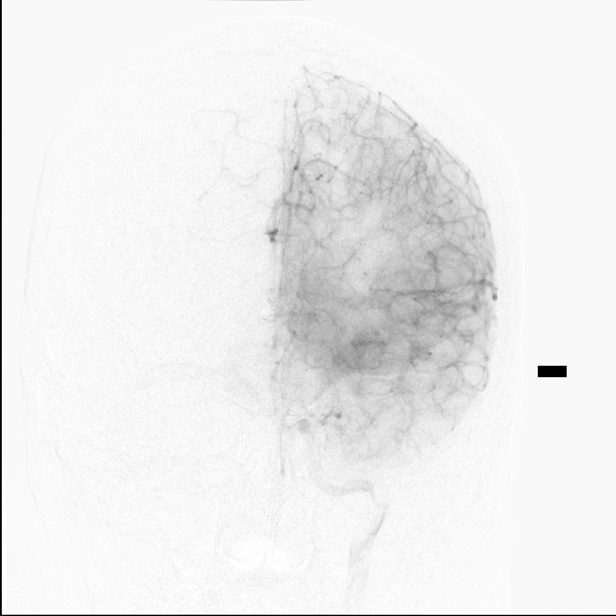

[Series 11: cerebral care 2 · 2 acquisitions, 1 frame shown (6 of 8)]
[im 1/2]
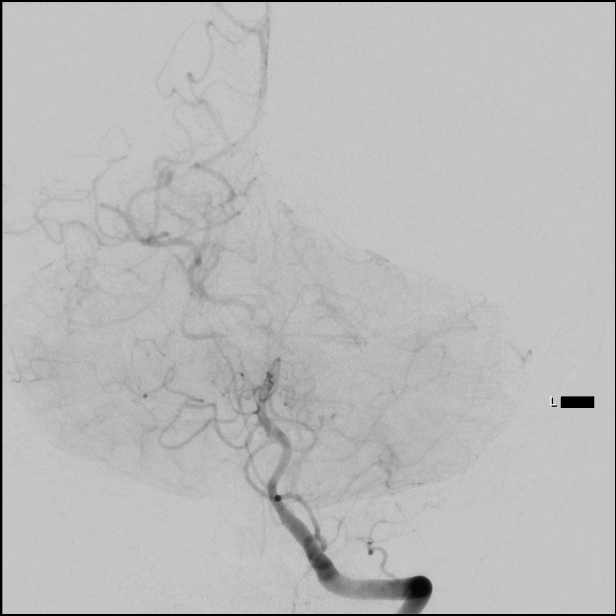

[Series 13: cerebral care 2 · 2 acquisitions, 1 frame shown (7 of 8)]
[im 1/2]
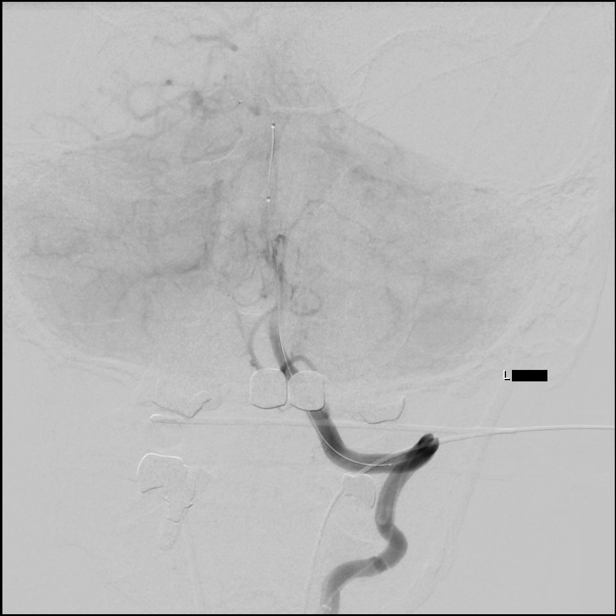

[Series 15: cerebral care 2 · 2 acquisitions, 1 frame shown (8 of 8)]
[im 1/2]
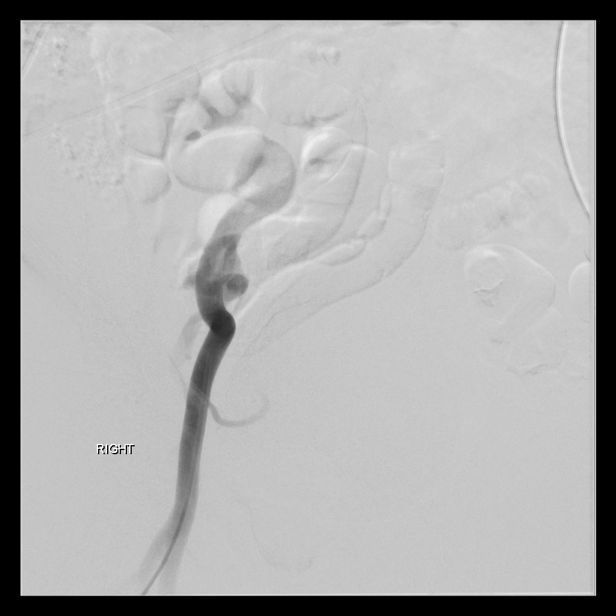

[Series 300: dr. (person_name) · 2 of 27 slices shown]
[im 8/27]
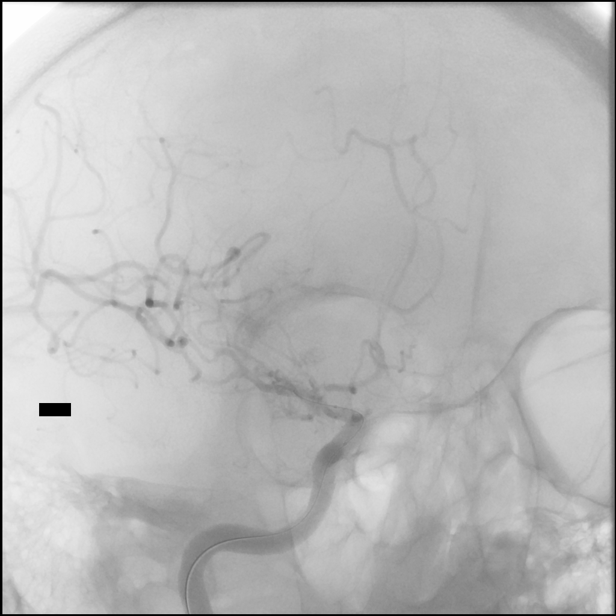
[im 19/27]
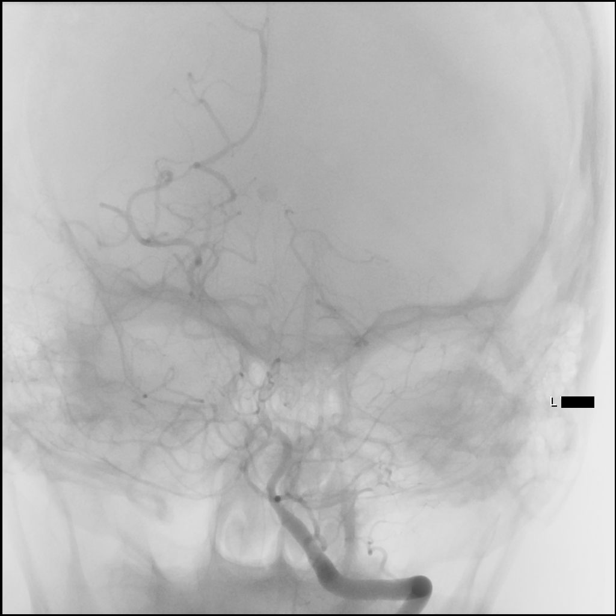

[10 of 24 positions shown; findings below may reference images not displayed]

ACCESS:
The technical aspects of the procedure as well as its potential
risks and benefits were reviewed with the patient's wife. These
risks included but were not limited to stroke, intracranial
hemorrhage, bleeding, infection, allergic reaction, damage to organs
or vital structures, stroke, non-diagnostic procedure, and the
catastrophic outcomes of heart attack, coma, and death. With an
understanding of these risks, informed consent was obtained and
witnessed. The patient was placed in the supine position on the
angiography table and the skin of right groin prepped in the usual
sterile fashion. The procedure was performed under general
anesthesia. A 5-French sheath was introduced in the right common
femoral artery using Seldinger technique.

MEDICATIONS:
HEPARIN: [UU] Units total.

VERAPAMIL: 15mg Intra-arterial

NITROGLYCERIN: Approximately [UU] intra-arterial

CONTRAST:  80mL OMNIPAQUE IOHEXOL 300 MG/ML SOLN, 10mL OMNIPAQUE
IOHEXOL 300 MG/ML SOLNcc, Omnipaque 300

FLUOROSCOPY TIME:  FLUOROSCOPY TIME: See IR records
Transform SC 4mm x 7mm balloon catheter

Transform C 3mm x 15mm balloon catheter

VESSELS CATHETERIZED
Right internal carotid

Left internal carotid

Left vertebral

Basilar artery

Right common femoral

VESSELS STUDIED
Right internal carotid, head

Left internal carotid, head

Left vertebral

Right common femoral

PROCEDURAL NARRATIVE
A 5-Fr MPD guide catheter was introduced over the Glidewire and
advanced over the aortic arch. The right common carotid artery was
catheterized, and angiogram was taken revealing severe right-sided
supraclinoid internal carotid artery spasm. Initially, 10 mg of
verapamil was injected intra-arterially into the right internal
carotid artery. Angiogram did not reveal significant improvement in
the spasm. I therefore introduced 1 mg of nitroglycerin into the 1 m
heparinized flush back connected to the guide catheter. The 4 mm x 7
mm supra compliant balloon catheter was prepared on the back table.
The balloon catheter was then introduced over the microwire and
advanced into the supraclinoid internal carotid artery. Multiple
inflations of the balloon catheter cause migration of the catheter
due to the significant stenosis about the supraclinoid segment. I
therefore removed this balloon catheter and the above 3 mm x 15 mm
compliant balloon was prepared in a similar fashion and advanced
over the microwire. The balloon was then placed across the
supraclinoid segment of the internal carotid artery, and successive
inflations of the balloon were performed over the supraclinoid
segment into the M1 segment on the right. After angioplasty,
angiogram was taken which demonstrated significant improvement in
the caliber of the supraclinoid internal carotid artery with
restoration of flow through the right middle cerebral artery
territory.

At this point the balloon catheter was removed, and the guide
catheter was withdrawn down into the aortic arch and the left
internal carotid artery was catheterized. Angiogram did reveal
spasms slightly worsened in comparison to the prior angiogram
primarily involving the supraclinoid internal carotid artery. The
spasm does also involve the left A1 segment which is relatively
small. At this point I elected to proceed with chemical angioplasty
as I think it would be extremely difficult to advance a balloon
catheter into the left A1 given the angle of origin and the very
small size of the vessel. 5 mg of verapamil was therefore introduced
intra-arterially.

At this point the guide catheter was used to select the left
vertebral artery. Angiogram revealed severe spasm of the basilar
artery. The glidewire was introduced and the guide catheter was
tracked over the Glidewire into the distal V2 segment. Under roadmap
guidance, the above 3 mm x 15 mm compliant balloon was introduced
over the microwire and advanced into the basilar artery. Again with
multiple successive inflations of the balloon, the basilar artery
was angioplasty. Final angiogram was taken revealing improvement in
the caliber of the basilar artery and perfusion of the posterior
circulation. Balloon catheter was then removed and final angiogram
was taken. The guide catheter was then removed without incident.
FINDINGS: Right internal carotid, head:

Injection reveals significantly decreased flow through the right
internal carotid artery, although the carotid is patent to the
supraclinoid segment. There is significant flow limitation and
contrast stasis within this vessel. There is minimal change in
amount of flow or vessel caliber after administration of 10 mg of
verapamil. Angiograms taken after balloon angioplasty of the
supraclinoid internal carotid artery and the proximal M1 reveal
improved caliber of the supraclinoid internal carotid artery. No
filling defects or dissection flaps are identified. There is the
restoration of flow through the right middle cerebral artery
territory post angioplasty.

Left internal carotid, head:

Injection reveals the presence of a patent internal carotid artery,
with moderate vasospasm involving the supraclinoid segment. In
comparison to the prior angiogram, flow to the contralateral ACA and
MCA territory is no longer visualized. Angiogram taken after
administration of 5 mg of verapamil shows minimal change in vessel
caliber. There is flash filling of the contralateral ACA territory
however.

Left vertebral:

Injection reveals the presence of a patent left vertebral artery,
with severe vasospasm involving the length of the basilar artery.
Post angioplasty angiogram reveals significant improvement in the
caliber of the basilar artery, with improved flow through the
posterior circulation. No filling defects or dissection flaps are
identified.

Right femoral:

Normal vessel. No significant atherosclerotic disease. Arterial
sheath in adequate position.

DISPOSITION:
Upon completion of the study, the femoral sheath was removed and
hemostasis obtained using a 5-Fr ExoSeal closure device. Good
proximal and distal lower extremity pulses were documented upon
achievement of hemostasis. The procedure was well tolerated and no
early complications were observed.

The patient was transferred to the postanesthesia care unit in
stable hemodynamic condition.
IMPRESSION: 1. Severe vasospasm involving the supraclinoid segments of the
internal carotid arteries bilaterally and the length of the basilar
artery. There is significant improvement in vessel caliber and
distal territory perfusion after balloon angioplasty of the right
internal carotid artery, right middle cerebral artery, and basilar
artery.

The preliminary results of this procedure were shared with the
patient's family.

## 2020-01-18 IMAGING — DX DG CHEST 1V PORT
1 series · 1 of 1 positions shown · non-contrast
Comparison: No prior.

CLINICAL DATA: Hypoxia.

EXAM:
PORTABLE CHEST 1 VIEW

[chest ap]
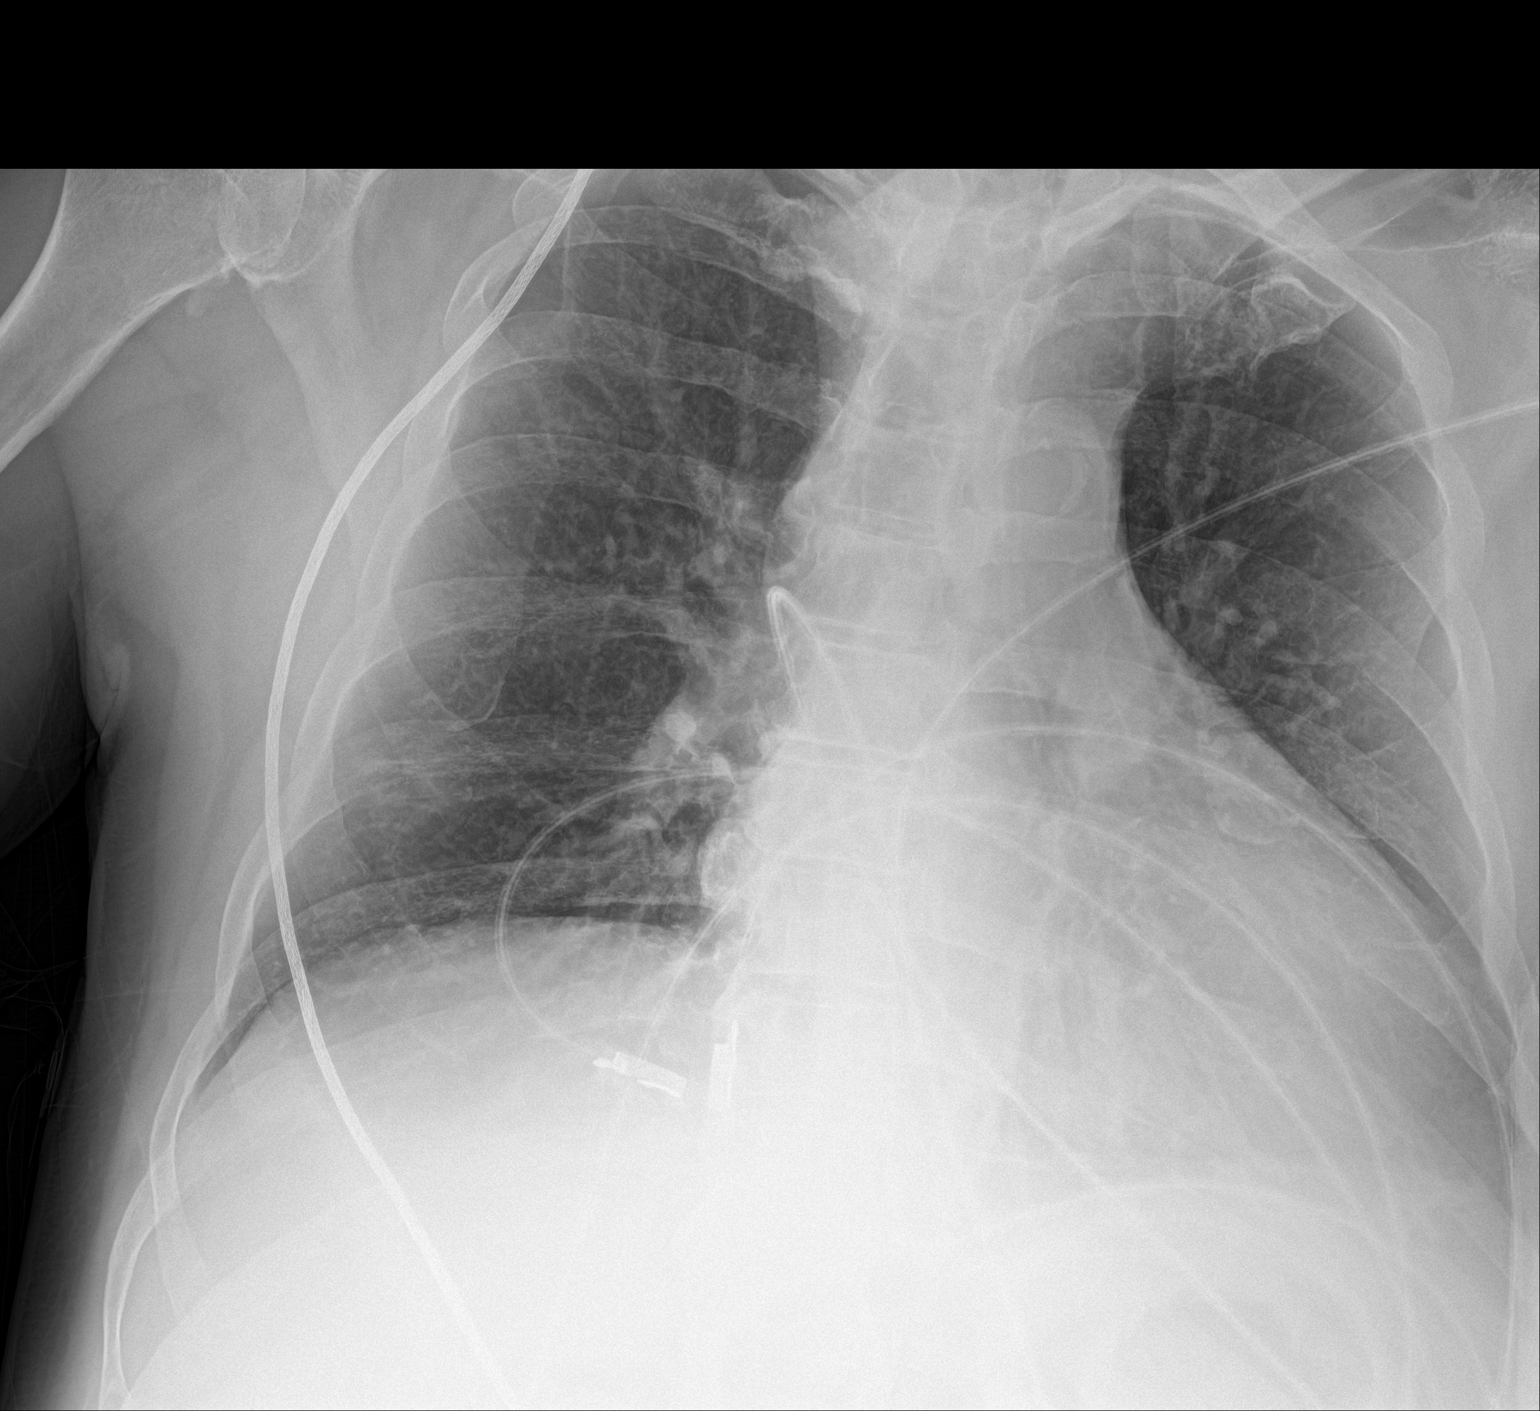

[1 of 1 positions shown; findings below may reference images not displayed]

FINDINGS: Mediastinum and hilar structures normal. Cardiomegaly. No pulmonary
venous congestion. Low lung volumes. Mild bibasilar infiltrates. No
pleural effusion or pneumothorax.
IMPRESSION: 1.  Cardiomegaly.  No pulmonary venous congestion.

2.  Low lung volumes.  Mild bibasilar infiltrates.

## 2020-01-18 SURGERY — IR WITH ANESTHESIA
Anesthesia: General

## 2020-01-18 MED ORDER — VERAPAMIL HCL 2.5 MG/ML IV SOLN
INTRAVENOUS | Status: AC
  Filled 2020-01-18: qty 2

## 2020-01-18 MED ORDER — INSULIN ASPART 100 UNIT/ML ~~LOC~~ SOLN
10.0000 [IU] | Freq: Three times a day (TID) | SUBCUTANEOUS | Status: DC
  Administered 2020-01-19 – 2020-01-24 (×13): 10 [IU] via SUBCUTANEOUS

## 2020-01-18 MED ORDER — MILRINONE LACTATE IN DEXTROSE 20-5 MG/100ML-% IV SOLN
0.5000 ug/kg/min | INTRAVENOUS | Status: DC
  Filled 2020-01-18: qty 100

## 2020-01-18 MED ORDER — ROCURONIUM BROMIDE 10 MG/ML (PF) SYRINGE
PREFILLED_SYRINGE | INTRAVENOUS | Status: DC | PRN
  Administered 2020-01-18: 60 mg via INTRAVENOUS
  Administered 2020-01-18 (×2): 20 mg via INTRAVENOUS

## 2020-01-18 MED ORDER — MILRINONE LACTATE IN DEXTROSE 20-5 MG/100ML-% IV SOLN
0.2500 ug/kg/min | INTRAVENOUS | Status: DC
  Administered 2020-01-18: 0.25 ug/kg/min via INTRAVENOUS
  Filled 2020-01-18: qty 100

## 2020-01-18 MED ORDER — LIDOCAINE 2% (20 MG/ML) 5 ML SYRINGE
INTRAMUSCULAR | Status: DC | PRN
  Administered 2020-01-18: 100 mg via INTRAVENOUS

## 2020-01-18 MED ORDER — PROPOFOL 10 MG/ML IV BOLUS
INTRAVENOUS | Status: DC | PRN
  Administered 2020-01-18: 200 mg via INTRAVENOUS

## 2020-01-18 MED ORDER — PHENYLEPHRINE HCL-NACL 10-0.9 MG/250ML-% IV SOLN
0.0000 ug/min | INTRAVENOUS | Status: DC
  Administered 2020-01-18: 20 ug/min via INTRAVENOUS
  Administered 2020-01-19: 400 ug/min via INTRAVENOUS
  Filled 2020-01-18: qty 500
  Filled 2020-01-18: qty 250

## 2020-01-18 MED ORDER — INSULIN DETEMIR 100 UNIT/ML ~~LOC~~ SOLN
30.0000 [IU] | SUBCUTANEOUS | Status: DC
  Administered 2020-01-18: 30 [IU] via SUBCUTANEOUS
  Filled 2020-01-18 (×2): qty 0.3

## 2020-01-18 MED ORDER — HEPARIN SODIUM (PORCINE) 1000 UNIT/ML IJ SOLN
INTRAMUSCULAR | Status: DC | PRN
Start: 2020-01-18 — End: 2020-01-18
  Administered 2020-01-18: 2000 [IU] via INTRAVENOUS

## 2020-01-18 MED ORDER — IOHEXOL 300 MG/ML  SOLN
150.0000 mL | Freq: Once | INTRAMUSCULAR | Status: AC | PRN
  Administered 2020-01-18: 10 mL via INTRA_ARTERIAL

## 2020-01-18 MED ORDER — SODIUM CHLORIDE 0.9 % IV SOLN
INTRAVENOUS | Status: DC | PRN
Start: 2020-01-18 — End: 2020-01-18

## 2020-01-18 MED ORDER — NITROGLYCERIN 1 MG/10 ML FOR IR/CATH LAB
INTRA_ARTERIAL | Status: AC
  Filled 2020-01-18: qty 10

## 2020-01-18 MED ORDER — FENTANYL CITRATE (PF) 250 MCG/5ML IJ SOLN
INTRAMUSCULAR | Status: DC | PRN
  Administered 2020-01-18 (×2): 50 ug via INTRAVENOUS

## 2020-01-18 MED ORDER — ALBUMIN HUMAN 5 % IV SOLN
25.0000 g | Freq: Once | INTRAVENOUS | Status: AC
  Administered 2020-01-18: 25 g via INTRAVENOUS
  Filled 2020-01-18: qty 500

## 2020-01-18 MED ORDER — VERAPAMIL HCL 2.5 MG/ML IV SOLN
INTRAVENOUS | Status: AC
  Filled 2020-01-18: qty 4

## 2020-01-18 MED ORDER — LACTATED RINGERS IV SOLN: INTRAVENOUS | Status: DC | PRN

## 2020-01-18 MED ORDER — PHENYLEPHRINE HCL-NACL 10-0.9 MG/250ML-% IV SOLN
0.0000 ug/min | INTRAVENOUS | Status: DC
  Administered 2020-01-18: 10:00:00 25 ug/min via INTRAVENOUS
  Filled 2020-01-18: qty 250

## 2020-01-18 MED ORDER — SUCCINYLCHOLINE CHLORIDE 20 MG/ML IJ SOLN
INTRAMUSCULAR | Status: DC | PRN
  Administered 2020-01-18: 80 mg via INTRAVENOUS

## 2020-01-18 MED ORDER — LEVETIRACETAM IN NACL 500 MG/100ML IV SOLN
500.0000 mg | Freq: Two times a day (BID) | INTRAVENOUS | Status: DC
  Administered 2020-01-18 – 2020-01-20 (×4): 500 mg via INTRAVENOUS
  Filled 2020-01-18 (×4): qty 100

## 2020-01-18 MED ORDER — SUGAMMADEX SODIUM 200 MG/2ML IV SOLN
INTRAVENOUS | Status: DC | PRN
  Administered 2020-01-18 (×2): 100 mg via INTRAVENOUS

## 2020-01-18 MED ORDER — PHENYLEPHRINE HCL (PRESSORS) 10 MG/ML IV SOLN
INTRAVENOUS | Status: DC | PRN
  Administered 2020-01-18 (×2): 80 ug via INTRAVENOUS

## 2020-01-18 MED ORDER — IOHEXOL 300 MG/ML  SOLN
150.0000 mL | Freq: Once | INTRAMUSCULAR | Status: AC | PRN
  Administered 2020-01-18: 80 mL via INTRA_ARTERIAL

## 2020-01-18 MED ORDER — ONDANSETRON HCL 4 MG/2ML IJ SOLN
INTRAMUSCULAR | Status: DC | PRN
  Administered 2020-01-18: 4 mg via INTRAVENOUS

## 2020-01-18 MED ORDER — VERAPAMIL HCL 2.5 MG/ML IV SOLN
INTRAVENOUS | Status: DC | PRN
  Administered 2020-01-18: 5 mg via INTRA_ARTERIAL
  Administered 2020-01-18: 10 mg via INTRA_ARTERIAL

## 2020-01-18 MED ORDER — IOHEXOL 350 MG/ML SOLN
50.0000 mL | Freq: Once | INTRAVENOUS | Status: AC | PRN
  Administered 2020-01-18: 11:00:00 50 mL via INTRAVENOUS

## 2020-01-18 MED ORDER — FENTANYL CITRATE (PF) 100 MCG/2ML IJ SOLN
INTRAMUSCULAR | Status: AC
  Filled 2020-01-18: qty 2

## 2020-01-18 NOTE — Procedures (Signed)
Critical care central line insertion note  Indication: administration of vasoactive drugs, CVP monitoring  Consent obtained: from wife.  Preprocedural timeout called: yes.  Full aseptic bundle use: yes  Ultrasound guidance: yes    Real-time guidance, wire visualized in vein.  Line inserted: 70F 20cm TLC  Insertion depth: 18cm  Vessel used: left subclavian  Number of attempts: 1  Line placement confirmation: by CXR.  X-ray performed: tip projects over SVC. No pneumothorax.  Lynnell Catalan, MD [facility] ICU Physician [facility]  Pager: [phone] Mobile: [phone] After hours: [phone].  11/13/2018, 6:28 PM

## 2020-01-18 NOTE — Progress Notes (Signed)
OT Cancellation Note  Patient Details Name: Nicholas [name] MRN: [id number] DOB: [birth year]   Cancelled Treatment:    Reason Eval/Treat Not Completed: Medical issues which prohibited therapy;Patient at procedure or test/ unavailable(Transport to CT.  RN reporting decrease in neuro status.) Will return as schedule allows.  Grey Rakestraw M Quanetta Truss Laurian Edrington MSOT, OTR/L Acute Rehab Pager: [phone] Office: [phone] 01/18/2020, 10:42 AM

## 2020-01-18 NOTE — Progress Notes (Signed)
Dr Conchita Paris came to evaluate pt. Order received to initiate Neo gtt to increase SBP. Will con't to closely monitor pt's neuro status and will notify of changes.

## 2020-01-18 NOTE — Anesthesia Preprocedure Evaluation (Addendum)
Anesthesia Evaluation  Patient identified by MRN, date of birth, ID band Patient confused    Reviewed: Allergy & Precautions, Patient's Chart, lab work & pertinent test resultsPreop documentation limited or incomplete due to emergent nature of procedure.  Airway Mallampati: III  TM Distance: >3 FB Neck ROM: Full    Dental no notable dental hx. (+) Dental Advisory Given, Teeth Intact   Pulmonary neg pulmonary ROS,    Pulmonary exam normal breath sounds clear to auscultation       Cardiovascular hypertension, Pt. on medications negative cardio ROS Normal cardiovascular exam Rhythm:Regular Rate:Normal     Neuro/Psych  Headaches, S/p angio a couple days ago- per NSG, Pt has become left hemiparetic with significant dysarthria and right gaze preference.  Emergent CTA done: significant narrowing of distal cavernous/supralincoid RICA, unclear but appears to be trace flow through the cervical/cavernous RICA. There is also narrowing of the right MCA and the distal basilar  negative psych ROS   GI/Hepatic negative GI ROS, Neg liver ROS,   Endo/Other  negative endocrine ROSdiabetes, Type 2, Insulin Dependent, Oral Hypoglycemic Agents  Renal/GU negative Renal ROS  negative genitourinary   Musculoskeletal negative musculoskeletal ROS (+)   Abdominal Normal abdominal exam  (+)   Peds negative pediatric ROS (+)  Hematology negative hematology ROS (+) hct 44.1   Anesthesia Other Findings Code stroke   Reproductive/Obstetrics negative OB ROS                            Anesthesia Physical Anesthesia Plan  ASA: IV and emergent  Anesthesia Plan: General   Post-op Pain Management:    Induction: Intravenous and Rapid sequence  PONV Risk Score and Plan: Ondansetron and Treatment may vary due to age or medical condition  Airway Management Planned: Oral ETT  Additional Equipment: Arterial  line  Intra-op Plan:   Post-operative Plan: Extubation in OR and Possible Post-op intubation/ventilation  Informed Consent: I have reviewed the patients History and Physical, chart, labs and discussed the procedure including the risks, benefits and alternatives for the proposed anesthesia with the patient or authorized representative who has indicated his/her understanding and acceptance.     Dental advisory given  Plan Discussed with: CRNA  Anesthesia Plan Comments: (Following some commands, good airway reflexes preop. Full stomach- full meal around 9am, RSI. Will d/w surgeon possibility of extubation. )       Anesthesia Quick Evaluation

## 2020-01-18 NOTE — Procedures (Signed)
Arterial Catheter Insertion Procedure Note Nicholas [name] [id number] April 03, 1951  Procedure: Insertion of Arterial Catheter  Indications: Blood pressure monitoring  Procedure Details Consent: Risks of procedure as well as the alternatives and risks of each were explained to the (patient/caregiver).  Consent for procedure obtained. Time Out: Verified patient identification, verified procedure, site/side was marked, verified correct patient position, special equipment/implants available, medications/allergies/relevent history reviewed, required imaging and test results available.  Performed  Maximum sterile technique was used including antiseptics, cap, gloves, hand hygiene, mask and sheet. Skin prep: Chlorhexidine; local anesthetic administered 20 gauge catheter was inserted into right radial artery using the Seldinger technique. Exchanged for longer catheter over guidewire. ULTRASOUND GUIDANCE USED: NO   Evaluation Blood flow good; BP tracing good. Complications: No apparent complications.   Nicholas [name] 01/18/2020

## 2020-01-18 NOTE — Progress Notes (Signed)
At pt bedside to do 1000 neuro exam and administer scheduled meds. Pt more lethargic. Pt unable to sip water from cup to take meds. Speech slurred. See flowsheet for complete assessment and mNIH.  Dr. Denese Killings notified and CTA ordered. Dr. Conchita Paris notified about change, that Neo gtt is being started and that pt will go to CT scan.

## 2020-01-18 NOTE — Anesthesia Postprocedure Evaluation (Signed)
Anesthesia Post Note  Patient: Nicholas [name]  Procedure(s) Performed: IR WITH ANESTHESIA (N/A )     Patient location during evaluation: PACU Anesthesia Type: General Level of consciousness: sedated Pain management: pain level controlled Vital Signs Assessment: post-procedure vital signs reviewed and stable Respiratory status: spontaneous breathing and respiratory function stable Cardiovascular status: stable Postop Assessment: no apparent nausea or vomiting Anesthetic complications: no    Last Vitals:  Vitals:   01/18/20 1429 01/18/20 1430  BP:  (!) 181/79  Pulse: 71 69  Resp: 18 18  Temp:    SpO2: (!) 88% 90%    Last Pain:  Vitals:   01/18/20 1445  TempSrc:   PainSc: 0-No pain                 Mandeep Kiser DANIEL

## 2020-01-18 NOTE — Progress Notes (Signed)
See flowsheet for complete assessment and mNIH. Pt c/o pain to head, rates 3/10. PRN Percocet given in attempt to reduce pain yet preserve neuro status. Pt hypertensive. Order to maintain SBP <170. PRN Labetolol given per PRN order. Scheduled Nimotop given. See MAR for exact times. Pt remains with drift to LUE and LLE. Dr. Denese Killings aware and is going to contact Dr. Bo Mcclintock.

## 2020-01-18 NOTE — Progress Notes (Addendum)
Critical Care Attending Progress Note   Patient sent to CTA and found to have severe vasospasm.  Underwent 3 vessel angioplasty with good result.   Initiated Hemodynamic augmentation. Patient spontaneously hypertensive to >220 SBP.  Have added milrinone.  Presently remains hemiplegic on the left side with right gaze preference. Remains alert and oriented and moves right side to command.   POC echocardiogram shows normal LV size with hyperdynamic function and no RWMA. EF of 78%. Normal RV function. Mild AV calcification at cusps but significant valvular lesions. Unable to assess RVSP. IVC 2cm with 25% variation with respiration estimated CVP of 10.  Grade 1 diastolic dysfunction, Normal LV filling pressure.  CO estimated at 6L/min by VTI. CI: 2.7  Continue with current augmentation to limit extent of secondary ischemic injury.  Follow CVP and trial albumin if needs fluids.  CRITICAL CARE Performed by: Lynnell Catalan   Total critical care time: 60 minutes of additional critical care time.  Critical care time was exclusive of separately billable procedures and treating other patients.  Critical care was necessary to treat or prevent imminent or life-threatening deterioration.  Critical care was time spent personally by me on the following activities: development of treatment plan with patient and/or surrogate as well as nursing, discussions with consultants, evaluation of patient's response to treatment, examination of patient, obtaining history from patient or surrogate, ordering and performing treatments and interventions, ordering and review of laboratory studies, ordering and review of radiographic studies, pulse oximetry, re-evaluation of patient's condition and participation in multidisciplinary rounds.  Lynnell Catalan, MD [facility] ICU Physician [facility] [place] Critical [facility]  Pager: [phone] Mobile: [phone] After hours: [phone].

## 2020-01-18 NOTE — Brief Op Note (Signed)
  NEUROSURGERY BRIEF OPERATIVE  NOTE   PREOP DX: Cerebral Vasospasm  POSTOP DX: Same  PROCEDURE:  1. Diagnostic cerebral angiogram 2. Balloon angioplasty of right internal carotid artery, right middle cerebral artery 3. Balloon angioplasty of basilar artery 4. Chemical angioplasty of right internal carotid artery, left internal carotid artery, basilar artery  SURGEON: Dr. Lisbeth Renshaw, MD  ANESTHESIA: GETA  EBL: Minimal  SPECIMENS: None  COMPLICATIONS: None  CONDITION: Stable to recovery  FINDINGS (Full report in [facility]): 1. 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  There is significant improvement in vessel caliber and distal territory perfusion after balloon angioplasty of the right internal carotid artery, right middle cerebral artery, and basilar artery

## 2020-01-18 NOTE — Transfer of Care (Signed)
Immediate Anesthesia Transfer of Care Note  Patient: Nicholas [name]  Procedure(s) Performed: IR WITH ANESTHESIA (N/A )  Patient Location: PACU  Anesthesia Type:General  Level of Consciousness: drowsy  Airway & Oxygen Therapy: Patient Spontanous Breathing and Patient connected to face mask oxygen  Post-op Assessment: Report given to RN and Post -op Vital signs reviewed and stable  Post vital signs: Reviewed and stable  Last Vitals:  Vitals Value Taken Time  BP 184/79 01/18/20 1412  Temp    Pulse 86 01/18/20 1414  Resp 17 01/18/20 1414  SpO2 87 % 01/18/20 1414  Vitals shown include unvalidated device data.  Last Pain:  Vitals:   01/18/20 0800  TempSrc: Oral  PainSc:       Patients Stated Pain Goal: 0 (01/16/20 1100)  Complications: No apparent anesthesia complications

## 2020-01-18 NOTE — Sedation Documentation (Signed)
Nitroglycerin 1mg /65ml added to 9m ns md is  admin. Per pressure line arterially

## 2020-01-18 NOTE — Anesthesia Procedure Notes (Signed)
Arterial Line Insertion Start/End4/30/2021 12:10 PM, 01/18/2020 12:15 PM Performed by: Lannie Fields, DO, anesthesiologist  Patient location: Pre-op. Preanesthetic checklist: patient identified, IV checked, site marked, risks and benefits discussed, surgical consent, monitors and equipment checked, pre-op evaluation, timeout performed and anesthesia consent Lidocaine 1% used for infiltration Right, radial was placed Catheter size: 20 Fr Hand hygiene performed  and maximum sterile barriers used   Attempts: 1 Procedure performed without using ultrasound guided technique. Following insertion, dressing applied. Post procedure assessment: normal and unchanged  Patient tolerated the procedure well with no immediate complications.

## 2020-01-18 NOTE — Progress Notes (Signed)
MD Nundkumar notifed of pt return from CT. He is reviewing film now. 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

## 2020-01-18 NOTE — Progress Notes (Signed)
Pt taken to CT scan at approx 1045. Upon arrival to scan, pt incontinent of urine, worsening R gaze, flaccid LUE. Pt on Neo gtt to maintain SBP up to 200. Pt returned to unit. See flowsheet for complete NIH. Dr. Conchita Paris notified by nursing staff that scan was complete. Orders received to prepare pt for IR. Two attempts made to call wife, but no answer. Wife returned to pt room as we were leaving for IR and updated by Dr. Conchita Paris and Dr. Pearlean Brownie. Care transferred to IR staff.

## 2020-01-18 NOTE — Progress Notes (Signed)
  NEUROSURGERY PROGRESS NOTE   No issues overnight but pt more lethargic this am, cont to c/o HA.  EXAM:  BP (!) 181/78   Pulse 67   Temp 98.2 F (36.8 C) (Oral)   Resp 18   Ht 6' (1.829 m)   Wt 90.7 kg   SpO2 90%   BMI 27.12 kg/m   Awake, alert, oriented  Speech fluent Mild left facial droop, tongue ML  Good strength RUE, BLE. 4/5 LUE with drift  IMPRESSION:  69 y.o. male SAH d#4 with decline in exam likely a result of spasm  PLAN: - Goal SBP up to , will get CTA prior to starting pressor support if needed - TCD today - Will need to stop narcotics as they seem to lower his BP. Can try fioricet - Cont close obs

## 2020-01-18 NOTE — Progress Notes (Signed)
Reason for Consult:stroke Referring Physician: Dr Betti CruzNundkumar  Nicholas [name] is an 69 y.o. male.  HPI: Mr. Nicholas [name] is a 69 year old Caucasian male with past medical history of diabetes, hypertension, benign prostatic hypertrophy who presented on 01/15/2020 from [facility] for evaluation of a sudden onset of headache which began the evening prior to admission.  He woke the next morning with confusion nausea vomiting any EMS were called to transport [facility] where CT scan of the head was obtained which showed large volume subarachnoid hemorrhage involving the right side greater than left with right tentorial subdural hemorrhage.  CT angiogram showed 25% diameter stenosis proximal right ICA and 50% proximal left ICA but no definite aneurysms were noted.  He underwent diagnostic cerebral catheter angiogram on 01/15/2020 by Dr. Conchita ParisNundkumar which showed significant vasospasm involving distal supraclinoid right ICA and proximal right M1 but no definite aneurysms or AVMs were noted.  Patient was apparently doing well until this morning when he was noted as having mild left upper extremity drift but still able to carry out a conversation.  Later this morning during rounds it was noticed that he had fixed right gaze deviation as well as increased left arm and some left leg weakness as well.  He was still able to communicate and speak.  Repeat CT angiogram was obtained which I personally reviewed Today which shows significant decrease in caliber of the right MCA and ACA with almost lack of flow in the right cavernous carotid and reduced flow in the cervical right ICA.  Patient was given albumin bolus as well as started on induced hypertension.  Stroke team was consulted to help manage stroke.  History obtained from his wife is patient has remote history of migraines which are infrequent once every 2 3 months or so never requiring him to seek medical help.  He has no history of strokes TIAs seizures.   Does not smoke or drink.  Is quite healthy and physically active.  There is no family history of aneurysms or intracerebral hemorrhage or strokes Past Medical History:  Diagnosis Date  . BPH (benign prostatic hypertrophy) 12/20/2011   Dr. Cleatrice Burkeoughlin  . Colon polyp    Dr. Vernell Barrierraelos, it was recommended that he have a follow-up colonoscopy in 2010.  Marland Kitchen. Deviated septum   . Diabetes mellitus without complication [facility](HCC)    Eye exam - 2011  . Hepatitis A   . HSV-1 (herpes simplex virus 1) infection   . Hyperlipidemia   . Hypertension   . Migraine headache     Past Surgical History:  Procedure Laterality Date  . IR ANGIO EXTERNAL CAROTID SEL EXT CAROTID BILAT MOD SED  01/15/2020  . IR ANGIO INTRA EXTRACRAN SEL INTERNAL CAROTID BILAT MOD SED  01/15/2020  . IR ANGIO VERTEBRAL SEL VERTEBRAL BILAT MOD SED  01/15/2020  . NASAL SEPTUM SURGERY    . RADIOLOGY WITH ANESTHESIA N/A 01/15/2020   Procedure: IR WITH ANESTHESIA;  Surgeon: Lisbeth RenshawNundkumar, Neelesh, MD;  [facility];  Service: Radiology;  Laterality: N/A;    Family History  Problem Relation Age of Onset  . Diabetes Mother   . Neuropathy Mother   . Diabetes Father   . Cancer Father        Lung Cancer  . Diabetes Sister   . Diabetes Brother     Social History:  reports that he has never smoked. He has never used smokeless tobacco. He reports current alcohol use. He reports that he  does not use drugs.  Allergies: No Known Allergies  Medications: I have reviewed the patient's current medications.  Results for orders placed or performed during the hospital encounter of 01/14/20 (from the past 48 hour(s))  Glucose, capillary     Status: Abnormal   Collection Time: 01/16/20  3:19 PM  Result Value Ref Range   Glucose-Capillary 176 (H) 70 - 99 mg/dL    Comment: Glucose reference range applies only to samples taken after fasting for at least 8 hours.   Comment 1 Notify RN    Comment 2 Document in Chart   Glucose, capillary     Status: Abnormal    Collection Time: 01/16/20  7:24 PM  Result Value Ref Range   Glucose-Capillary 189 (H) 70 - 99 mg/dL    Comment: Glucose reference range applies only to samples taken after fasting for at least 8 hours.  Glucose, capillary     Status: Abnormal   Collection Time: 01/16/20 11:15 PM  Result Value Ref Range   Glucose-Capillary 228 (H) 70 - 99 mg/dL    Comment: Glucose reference range applies only to samples taken after fasting for at least 8 hours.  Basic metabolic panel     Status: Abnormal   Collection Time: 01/17/20  2:50 AM  Result Value Ref Range   Sodium 141 135 - 145 mmol/L   Potassium 4.0 3.5 - 5.1 mmol/L   Chloride 107 98 - 111 mmol/L   CO2 25 22 - 32 mmol/L   Glucose, Bld 182 (H) 70 - 99 mg/dL    Comment: Glucose reference range applies only to samples taken after fasting for at least 8 hours.   BUN 23 8 - 23 mg/dL   Creatinine, Ser 2.69 0.61 - 1.24 mg/dL   Calcium 8.3 (L) 8.9 - 10.3 mg/dL   GFR calc non Af Amer >60 >60 mL/min   GFR calc Af Amer >60 >60 mL/min   Anion gap 9 5 - 15    Comment: Performed at [facility], [address] [place], [place] [address]  CBC     Status: Abnormal   Collection Time: 01/17/20  2:50 AM  Result Value Ref Range   WBC 17.2 (H) 4.0 - 10.5 K/uL   RBC 4.87 4.22 - 5.81 MIL/uL   Hemoglobin 14.2 13.0 - 17.0 g/dL   HCT 27.0 35.0 - 09.3 %   MCV 90.6 80.0 - 100.0 fL   MCH 29.2 26.0 - 34.0 pg   MCHC 32.2 30.0 - 36.0 g/dL   RDW 81.8 29.9 - 37.1 %   Platelets 196 150 - 400 K/uL   nRBC 0.0 0.0 - 0.2 %    Comment: Performed at [facility], [address] [place], [place] [address]  Magnesium     Status: None   Collection Time: 01/17/20  2:50 AM  Result Value Ref Range   Magnesium 1.8 1.7 - 2.4 mg/dL    Comment: Performed at [facility], [address] [place], [place] [address]  Phosphorus     Status: None   Collection Time: 01/17/20  2:50 AM  Result Value Ref Range   Phosphorus 2.8 2.5 - 4.6 mg/dL    Comment: Performed  at [facility], [address], [place], [place] [address]  Triglycerides     Status: Abnormal   Collection Time: 01/17/20  2:50 AM  Result Value Ref Range   Triglycerides 413 (H) <150 mg/dL    Comment: Performed  at [facility], [place] [address] [place], [place] [address]  Glucose, capillary     Status: Abnormal   Collection Time: 01/17/20  3:15 AM  Result Value Ref Range   Glucose-Capillary 189 (H) 70 - 99 mg/dL    Comment: Glucose reference range applies only to samples taken after fasting for at least 8 hours.  Glucose, capillary     Status: Abnormal   Collection Time: 01/17/20  7:31 AM  Result Value Ref Range   Glucose-Capillary 183 (H) 70 - 99 mg/dL    Comment: Glucose reference range applies only to samples taken after fasting for at least 8 hours.  Glucose, capillary     Status: Abnormal   Collection Time: 01/17/20 11:13 AM  Result Value Ref Range   Glucose-Capillary 188 (H) 70 - 99 mg/dL    Comment: Glucose reference range applies only to samples taken after fasting for at least 8 hours.  Glucose, capillary     Status: Abnormal   Collection Time: 01/17/20  2:52 PM  Result Value Ref Range   Glucose-Capillary 216 (H) 70 - 99 mg/dL    Comment: Glucose reference range applies only to samples taken after fasting for at least 8 hours.  Glucose, capillary     Status: Abnormal   Collection Time: 01/17/20 10:14 PM  Result Value Ref Range   Glucose-Capillary 208 (H) 70 - 99 mg/dL    Comment: Glucose reference range applies only to samples taken after fasting for at least 8 hours.  Glucose, capillary     Status: Abnormal   Collection Time: 01/18/20  7:21 AM  Result Value Ref Range   Glucose-Capillary 243 (H) 70 - 99 mg/dL    Comment: Glucose reference range applies only to samples taken after fasting for at least 8 hours.    CT ANGIO HEAD W OR WO CONTRAST  Result Date: 01/18/2020 CLINICAL DATA:  Vasospasm, subarachnoid hemorrhage EXAM: CT ANGIOGRAPHY HEAD TECHNIQUE:  Multidetector CT imaging of the head was performed using the standard protocol during bolus administration of intravenous contrast. Multiplanar CT image reconstructions and MIPs were obtained to evaluate the vascular anatomy. CONTRAST:  73mL OMNIPAQUE IOHEXOL 350 MG/ML SOLN COMPARISON:  01/15/2020 FINDINGS: CT HEAD Brain: Subarachnoid hemorrhage is present within the basal cisterns eccentric to the right extending along the interhemispheric fissure and right sylvian fissure. There is some sulcal subarachnoid hemorrhage as well as hemorrhage within the cisterna magna. New hypoattenuating left subdural collection is present measuring 4 mm in thickness. Extra-axial hyperdense hemorrhage is present along the right tentorium. Overall volume of hyperdense hemorrhage has decreased since the prior study. Ventricles have decreased in size and there is no hydrocephalus. New mild rightward midline shift measuring 6 mm. No new loss of gray-white differentiation. Vascular: There is intracranial atherosclerotic calcification at the skull base. Skull: Unremarkable. Sinuses: Minor mucosal thickening. Orbits: Unremarkable. CTA HEAD Anterior circulation: Significantly diminished flow within the visualized right internal carotid artery with no discernible enhancement at and beyond the cavernous segment. Right middle and anterior cerebral arteries are patent. Mildly decreased caliber of the right M1 MCA. Diminished distal right MCA branch filling. Left intracranial internal carotid is patent with calcified plaque causing mild stenosis. Left anterior and middle cerebral arteries are patent. There is narrowing of the left A1 ACA with severe stenosis near the origin. Posterior circulation: Intracranial vertebral arteries are patent. Posterior inferior cerebellar arteries are patent basilar artery is patent with diffuse irregularity of the distal portion, which is present on the  prior study. However, there is now superimposed severe  stenosis. Superior cerebellar arteries are patent with probable stenosis. Posterior cerebral arteries are patent. There is fetal origin of the left posterior cerebral artery. Venous sinuses: Not well evaluated IMPRESSION: Overall decreased volume of subarachnoid hemorrhage since 01/15/2020. New left subdural hygroma and subcentimeter rightward midline shift. Resolution of hydrocephalus. Further significantly diminished flow within the right ICA with no enhancement visualized at and beyond the cavernous segment. Mild increased right M1 MCA narrowing and diminished distal right MCA branch filling. New narrowing of the left A1 ACA with severe stenosis near the origin. Persistent irregularity of the distal basilar artery with new superimposed severe stenosis. Electronically Signed   By: Guadlupe Spanish M.D.   On: 01/18/2020 11:44   DG CHEST PORT 1 VIEW  Result Date: 01/18/2020 CLINICAL DATA:  Hypoxia. EXAM: PORTABLE CHEST 1 VIEW COMPARISON:  No prior. FINDINGS: Mediastinum and hilar structures normal. Cardiomegaly. No pulmonary venous congestion. Low lung volumes. Mild bibasilar infiltrates. No pleural effusion or pneumothorax. IMPRESSION: 1.  Cardiomegaly.  No pulmonary venous congestion. 2.  Low lung volumes.  Mild bibasilar infiltrates. Electronically Signed   By: Maisie Fus  Register   On: 01/18/2020 09:30   CT-scan of the brain MRI examination of the brain.   CT-scan of the brain  Ultrasound   ROS Blood pressure (!) 217/97, pulse 90, temperature 98.2 F (36.8 C), temperature source Oral, resp. rate 20, height 6' (1.829 m), weight 90.7 kg, SpO2 95 %. Physical Exam Pleasant middle-age Caucasian male not in distress. . Afebrile. Head is nontraumatic. Neck is supple without bruit.    Cardiac exam no murmur or gallop. Lungs are clear to auscultation. Distal pulses are well felt. Neurological Exam :  Patient is awake mildly drowsy but follows commands appropriately.  He has right head and gaze deviation.  He  is unable to look to the left.  He blinks to threat on the right but not on the left.  He has significant left hemineglect does not recognize his left arm.  Pupils are equal reactive.  Left lower facial weakness.  Tongue midline.  Left hemiplegia with left upper extremity 0/5 strength with left lower extremity 2/5 strength and with repeated cues is able to move the left leg.  Withdraws left leg to pain briskly but not the left upper extremity.  Purposeful antigravity movement on the right side.  Profound left hemineglect.  Unable to feel sensation on the left.    Assessment/: : 69 year old male with aneurysm negative subarachnoid hemorrhage 3 days ago with documented right terminal ICA and MCA vasospasm with neurological decline suggestive of the right hemispheric infarct likely due to's progression of right cavernous ICA with near occlusion. PLAN : Recommend emergent diagnostic cerebral catheter angiogram with treatment of vasospasm with intra-arterial papaverine/nitroglycerin or angioplasty if necessary.  If there is a luminal thrombus may need mechanical thrombectomy as well.  Institute triple H therapy with hypervolemia, hemodilution and hypertension to keep mean arterial pressure elevated.  Continue Nimodipine 60 mg 5 times daily and keep normal thermic and normoglycemic.  DVT and GI prophylaxis.  Long discussion with Dr. Conchita Paris and patient's wife and answered questions.  Discussed with Dr. Lynnell Catalan critical care medicine. This patient is critically ill and at significant risk of neurological worsening, death and care requires constant monitoring of vital signs, hemodynamics,respiratory and cardiac monitoring, extensive review of multiple databases, frequent neurological assessment, discussion with family, other specialists and medical decision making of high complexity.I have made any  additions or clarifications directly to the above note.This critical care time does not reflect procedure time, or  teaching time or supervisory time of PA/NP/Med Resident etc but could involve care discussion time.  I spent 50 minutes of neurocritical care time  in the care of  this patient.     Delia Heady 01/18/2020, 1:49 PM    Note: This document was prepared with digital dictation and possible smart phrase technology. Any transcriptional errors that result from this process are unintentional.

## 2020-01-18 NOTE — Anesthesia Procedure Notes (Signed)
Procedure Name: Intubation Date/Time: 01/18/2020 12:09 PM Performed by: Trinna Post., CRNA Pre-anesthesia Checklist: Patient identified, Emergency Drugs available, Suction available, Patient being monitored and Timeout performed Patient Re-evaluated:Patient Re-evaluated prior to induction Oxygen Delivery Method: Circle system utilized Preoxygenation: Pre-oxygenation with 100% oxygen Induction Type: IV induction, Rapid sequence and Cricoid Pressure applied Laryngoscope Size: Mac and 4 Grade View: Grade I Tube size: 7.5 mm Number of attempts: 1 Airway Equipment and Method: Stylet Placement Confirmation: ETT inserted through vocal cords under direct vision,  positive ETCO2 and breath sounds checked- equal and bilateral Secured at: 22 cm Tube secured with: Tape Dental Injury: Teeth and Oropharynx as per pre-operative assessment

## 2020-01-18 NOTE — Progress Notes (Signed)
  NEUROSURGERY PROGRESS NOTE   No issues overnight. Pt with unchanged HA, otherwise no new complaints.  EXAM:  BP (!) 181/78   Pulse 67   Temp 98.2 F (36.8 C) (Oral)   Resp 18   Ht 6' (1.829 m)   Wt 90.7 kg   SpO2 90%   BMI 27.12 kg/m   Awake, alert, oriented  Speech fluent, appropriate  CN grossly intact  5/5 BUE/BLE   IMPRESSION:  68 y.o. male SAH d#3, initial CTA and angio negative. Neurologically stable  PLAN: - Cont nimotop - TCD tomorrow - OOB  - Cont obs for spasm

## 2020-01-18 NOTE — Progress Notes (Signed)
Bedside report to PACU RN groin/pulses assessed.

## 2020-01-18 NOTE — Progress Notes (Signed)
1142 I contacted Dr Conchita Paris again of pts status. He replied immediately that pt is to be taken to IR.  Pt's wife was called. IR was called. CRNA Thayer Ohm was called. Dr Conchita Paris to bedside at 1145. Pts wife arrived and was updated by Dr Conchita Paris. Pt was taken to IR by RN at 1155. Handoff to [facility] and [employer].

## 2020-01-18 NOTE — Progress Notes (Signed)
NAME:  Nicholas [name], MRN:  [id number], DOB:  October 18, 1950, LOS: 4 ADMISSION DATE:  01/14/2020, CONSULTATION DATE:  01/14/2020 REFERRING MD:  Wynetta Emery - [place] [facility], CHIEF COMPLAINT:  SAH   HPI/course in hospital  69 year old man who presented with 24h of progressive nuchal rigidity and was found to have a HH 2 Fisher 3 SAH  Both CTA and catheter angiography negative for aneurysm due to presence of vasospasm.   Has maintained normal examination   Past Medical History   Past Medical History:  Diagnosis Date  . BPH (benign prostatic hypertrophy) 12/20/2011   Dr. Cleatrice Burke  . Colon polyp    Dr. Vernell Barrier, it was recommended that he have a follow-up colonoscopy in 2010.  Marland Kitchen Deviated septum   . Diabetes mellitus without complication [facility])    Eye exam - 2011  . Hepatitis A   . HSV-1 (herpes simplex virus 1) infection   . Hyperlipidemia   . Hypertension   . Migraine headache      Past Surgical History:  Procedure Laterality Date  . IR ANGIO EXTERNAL CAROTID SEL EXT CAROTID BILAT MOD SED  01/15/2020  . IR ANGIO INTRA EXTRACRAN SEL INTERNAL CAROTID BILAT MOD SED  01/15/2020  . IR ANGIO VERTEBRAL SEL VERTEBRAL BILAT MOD SED  01/15/2020  . NASAL SEPTUM SURGERY    . RADIOLOGY WITH ANESTHESIA N/A 01/15/2020   Procedure: IR WITH ANESTHESIA;  Surgeon: Lisbeth Renshaw, MD;  Location: [facility] OR;  Service: Radiology;  Laterality: N/A;     Interim history/subjective:  Increasing cephalgia with increase analgesic requirement.  Hypertension increasingly difficult to control. Increased left arm and leg drift.  Objective   Blood pressure (!) 182/87, pulse 74, temperature 98.2 F (36.8 C), temperature source Oral, resp. rate 18, height 6' (1.829 m), weight 90.7 kg, SpO2 92 %.        Intake/Output Summary (Last 24 hours) at 01/18/2020 0850 Last data filed at 01/18/2020 0813 Gross per 24 hour  Intake 3148.53 ml  Output 400 ml  Net 2748.53 ml   Filed Weights   01/14/20 0936 01/15/20 1237  Weight: 90.7 kg  90.7 kg    Examination: Physical Exam Vitals reviewed.  Constitutional:      Appearance: Normal appearance.  HENT:     Head: Atraumatic.     Mouth/Throat:     Mouth: Mucous membranes are moist.  Eyes:     Extraocular Movements: Extraocular movements intact.  Neck:     Vascular: No JVD.  Cardiovascular:     Rate and Rhythm: Normal rate.     Pulses: Normal pulses.     Heart sounds: Murmur present. Systolic murmur present with a grade of 2/6. Gallop present. S4 sounds present.   Pulmonary:     Effort: Pulmonary effort is normal.     Breath sounds: Examination of the right-lower field reveals rales. Examination of the left-lower field reveals rales. Rales present.  Abdominal:     General: Abdomen is flat.     Palpations: Abdomen is soft.  Genitourinary:    Penis: Normal.   Musculoskeletal:     Cervical back: Rigidity present.     Right lower leg: No edema.     Left lower leg: No edema.  Neurological:     Mental Status: He is oriented to person, place, and time and easily aroused. He is lethargic.     GCS: GCS eye subscore is 4. GCS verbal subscore is 5. GCS motor subscore is 6.  Cranial Nerves: Cranial nerves are intact.     Motor: Weakness and pronator drift present.     Comments: 4/5 weakness on left side      Ancillary tests (personally reviewed)  CBC: Recent Labs  Lab 01/14/20 1021 01/14/20 1022 01/15/20 0452 01/16/20 0416 01/17/20 0250  WBC 14.6*  --  12.1* 23.8* 17.2*  NEUTROABS 13.7*  --   --   --   --   HGB 15.9 16.7 15.2 14.2 14.2  HCT 46.7 49.0 45.6 43.4 44.1  MCV 85.4  --  89.6 90.6 90.6  PLT 232  --  256 231 196    Basic Metabolic Panel: Recent Labs  Lab 01/14/20 1021 01/14/20 1022 01/15/20 0452 01/16/20 0416 01/17/20 0250  NA 139 141 145 143 141  K 3.9 3.9 3.8 4.3 4.0  CL 101  --  112* 109 107  CO2 20*  --  23 22 25   GLUCOSE 343*  --  238* 135* 182*  BUN 27*  --  35* 41* 23  CREATININE 1.13  --  1.18 1.00 0.76  CALCIUM 9.7  --  9.5  8.9 8.3*  MG  --   --  2.2 2.1 1.8  PHOS  --   --  1.4* 3.6 2.8   GFR: Estimated Creatinine Clearance: 99.7 mL/min (by C-G formula based on SCr of 0.76 mg/dL). Recent Labs  Lab 01/14/20 1021 01/14/20 1230 01/15/20 0452 01/16/20 0416 01/17/20 0250  WBC 14.6*  --  12.1* 23.8* 17.2*  LATICACIDVEN 3.2* 2.7*  --   --   --     Liver Function Tests: Recent Labs  Lab 01/14/20 1021  AST 28  ALT 41  ALKPHOS 44  BILITOT 1.3*  PROT 8.8*  ALBUMIN 5.2*   No results for input(s): LIPASE, AMYLASE in the last 168 hours. Recent Labs  Lab 01/14/20 1021  AMMONIA 17    ABG    Component Value Date/Time   HCO3 22.9 01/14/2020 1022   TCO2 24 01/14/2020 1022   O2SAT 90.0 01/14/2020 1022     Coagulation Profile: Recent Labs  Lab 01/14/20 1022  INR 1.1    Cardiac Enzymes: No results for input(s): CKTOTAL, CKMB, CKMBINDEX, TROPONINI in the last 168 hours.  HbA1C: Hemoglobin A1C  Date/Time Value Ref Range Status  01/22/2014 10:09 AM 7.4  Final   Hgb A1c MFr Bld  Date/Time Value Ref Range Status  01/14/2020 05:15 PM 7.7 (H) 4.8 - 5.6 % Final    Comment:    (NOTE) Pre diabetes:          5.7%-6.4% Diabetes:              >6.4% Glycemic control for   <7.0% adults with diabetes   10/16/2013 08:55 AM 8.2 (H) <5.7 % Final    Comment:                                                                           According to the ADA Clinical Practice Recommendations for 2011, when HbA1c is used as a screening test:     >=6.5%   Diagnostic of Diabetes Mellitus            (if abnormal result is confirmed)  5.7-6.4%   Increased risk of developing Diabetes Mellitus   References:Diagnosis and Classification of Diabetes Mellitus,Diabetes IRCV,8938,10(FBPZW 1):S62-S69 and Standards of Medical Care in         Diabetes - 2011,Diabetes CHEN,2778,24 (Suppl 1):S11-S61.      CBG: Recent Labs  Lab 01/17/20 0731 01/17/20 1113 01/17/20 1452 01/17/20 2214 01/18/20 0721  GLUCAP 183*  188* 216* 208* 243*   ECHO 4/27: Left ventricular ejection fraction, by estimation, is 70 to 75%. The left ventricle has hyperdynamic function. The left ventricle has no regional wall motion abnormalities. The left ventricular internal cavity size was normal in size. There is mild left ventricular hypertrophy of the basal segment. Left ventricular diastolic parameters are consistent with Grade I diastolic dysfunction (impaired relaxation).  Assessment & Plan:   Critically ill due to acute SAH complicated by vasospasm. Still suspect presence of aneurysm. Increasing BP and headache are concerning and may herald worsening vasospasm - liberalize BP goals, augment if necessary -CTA if no improvement with BP at 200  Hypoxia likely due to atelectasis -CXR - Incentive spirometry  Type 2 diabetes with poor control at baseline   Daily Goals Checklist  Pain/Anxiety/Delirium protocol (if indicated): oxycodone/hydromorphone prn. Neuro vitals: every 1 hours AED's: Keppra prophylaxis VAP protocol (if indicated): not intubated. Respiratory support goals: Incentive spirometry Blood pressure target: 180-200 - augment if necessary DVT prophylaxis: SCD's - start heparin Nutrition Status: Oral diabetic diet GI prophylaxis: not indicated. Fluid status goals: 0.9% NS at 125 - continue aggressive normovolemia Urinary catheter: voiding spontaneously Central lines: PIV only Glucose control: inadequate control - increase mealtime coverage Mobility/therapy needs: Bed rest with suspicion of worsening vasospasm Antibiotic de-escalation: none Home medication reconciliation: on hold. Daily labs: CBC, BMP Code Status: full Family Communication: Updated at bedside. Disposition: ICU  CRITICAL CARE Performed by: Kipp Brood   Total critical care time: 35 minutes  Critical care time was exclusive of separately billable procedures and treating other patients.  Critical care was necessary to treat or  prevent imminent or life-threatening deterioration.  Critical care was time spent personally by me on the following activities: development of treatment plan with patient and/or surrogate as well as nursing, discussions with consultants, evaluation of patient's response to treatment, examination of patient, obtaining history from patient or surrogate, ordering and performing treatments and interventions, ordering and review of laboratory studies, ordering and review of radiographic studies, pulse oximetry, re-evaluation of patient's condition and participation in multidisciplinary rounds.  Kipp Brood, MD [facility] ICU Physician [place]  Pager: [phone] Mobile: [phone] After hours: [phone].     01/18/2020, 8:50 AM

## 2020-01-18 NOTE — Progress Notes (Signed)
  NEUROSURGERY PROGRESS NOTE   Pt has become left hemiparetic with significant dysarthria and right gaze preference. CTA completed.  EXAM:  BP (!) 190/83   Pulse 77   Temp 98.2 F (36.8 C) (Oral)   Resp (!) 22   Ht 6' (1.829 m)   Wt 90.7 kg   SpO2 93%   BMI 27.12 kg/m   Awake, alert, oriented  Speech dysarthric but fluent  Left facial droop, tongue midline Rightward gaze 0/5 LUE, >antigravity (4-/5) LLE Good strength RUE/RLE  CTA reviewed demonstrating significant narrowing of distal cavernous/supralincoid RICA, unclear but appears to be trace flow through the cervical/cavernous RICA. There is also narrowing of the right MCA and the distal basilar  IMPRESSION:  69 y.o. 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 We discussed possibility of angioplasty with medication, balloon, or even possibility of thrombectomy. I did tell her that the endovascular treatment of spasm can be temporary with progression of stroke after procedure. We discussed risks of the procedure. Her questions were answered and provided consent on her husband's behalf to proceed.

## 2020-01-19 ENCOUNTER — Inpatient Hospital Stay (HOSPITAL_COMMUNITY): Payer: BC Managed Care – PPO

## 2020-01-19 DIAGNOSIS — I729 Aneurysm of unspecified site: Secondary | ICD-10-CM | POA: Diagnosis not present

## 2020-01-19 DIAGNOSIS — I609 Nontraumatic subarachnoid hemorrhage, unspecified: Principal | ICD-10-CM

## 2020-01-19 DIAGNOSIS — D72829 Elevated white blood cell count, unspecified: Secondary | ICD-10-CM

## 2020-01-19 DIAGNOSIS — E1159 Type 2 diabetes mellitus with other circulatory complications: Secondary | ICD-10-CM

## 2020-01-19 DIAGNOSIS — E782 Mixed hyperlipidemia: Secondary | ICD-10-CM

## 2020-01-19 DIAGNOSIS — R509 Fever, unspecified: Secondary | ICD-10-CM

## 2020-01-19 DIAGNOSIS — I63513 Cerebral infarction due to unspecified occlusion or stenosis of bilateral middle cerebral arteries: Secondary | ICD-10-CM

## 2020-01-19 LAB — URINALYSIS, COMPLETE (UACMP) WITH MICROSCOPIC
Bacteria, UA: NONE SEEN
Bilirubin Urine: NEGATIVE
Glucose, UA: >=500 mg/dL — AB
Hgb urine dipstick: NEGATIVE
Ketones, ur: NEGATIVE mg/dL
Leukocytes,Ua: NEGATIVE
Nitrite: NEGATIVE
Protein, ur: 30 mg/dL — AB
Specific Gravity, Urine: 1.008 (ref 1.005–1.030)
pH: 7 (ref 5.0–8.0)

## 2020-01-19 LAB — BASIC METABOLIC PANEL
Anion gap: 10 (ref 5–15)
Anion gap: 12 (ref 5–15)
BUN: 14 mg/dL (ref 8–23)
BUN: 12 mg/dL (ref 8–23)
CO2: 27 mmol/L (ref 22–32)
CO2: 29 mmol/L (ref 22–32)
Calcium: 8.6 mg/dL — ABNORMAL LOW (ref 8.9–10.3)
Calcium: 8.7 mg/dL — ABNORMAL LOW (ref 8.9–10.3)
Chloride: 106 mmol/L (ref 98–111)
Chloride: 104 mmol/L (ref 98–111)
Creatinine, Ser: 0.8 mg/dL (ref 0.61–1.24)
Creatinine, Ser: 0.87 mg/dL (ref 0.61–1.24)
GFR calc Af Amer: >60 mL/min (ref >60)
GFR calc Af Amer: >60 mL/min (ref >60)
GFR calc non Af Amer: >60 mL/min (ref >60)
GFR calc non Af Amer: >60 mL/min (ref >60)
Glucose, Bld: 209 mg/dL — ABNORMAL HIGH (ref 70–99)
Glucose, Bld: 228 mg/dL — ABNORMAL HIGH (ref 70–99)
Potassium: 3.6 mmol/L (ref 3.5–5.1)
Potassium: 3.2 mmol/L — ABNORMAL LOW (ref 3.5–5.1)
Sodium: 143 mmol/L (ref 135–145)
Sodium: 145 mmol/L (ref 135–145)

## 2020-01-19 LAB — LIPID PANEL
Cholesterol: 88 mg/dL (ref 0–200)
HDL: 26 mg/dL — ABNORMAL LOW (ref >40)
LDL Cholesterol: 37 mg/dL (ref 0–99)
Total CHOL/HDL Ratio: 3.4 RATIO
Triglycerides: 124 mg/dL (ref <150)
VLDL: 25 mg/dL (ref 0–40)

## 2020-01-19 LAB — CULTURE, BLOOD (ROUTINE X 2)
Culture: NO GROWTH
Culture: NO GROWTH
Special Requests: ADEQUATE
Special Requests: ADEQUATE

## 2020-01-19 LAB — CBC
HCT: 40.2 % (ref 39.0–52.0)
Hemoglobin: 13 g/dL (ref 13.0–17.0)
MCH: 29.3 pg (ref 26.0–34.0)
MCHC: 32.3 g/dL (ref 30.0–36.0)
MCV: 90.5 fL (ref 80.0–100.0)
Platelets: 224 10*3/uL (ref 150–400)
RBC: 4.44 MIL/uL (ref 4.22–5.81)
RDW: 13.4 % (ref 11.5–15.5)
WBC: 14.8 10*3/uL — ABNORMAL HIGH (ref 4.0–10.5)
nRBC: 0 % (ref 0.0–0.2)

## 2020-01-19 LAB — COOXEMETRY PANEL
Carboxyhemoglobin: 1.6 % — ABNORMAL HIGH (ref 0.5–1.5)
Methemoglobin: 0.8 % (ref 0.0–1.5)
O2 Saturation: 96.9 %
Total hemoglobin: 13.1 g/dL (ref 12.0–16.0)

## 2020-01-19 LAB — GLUCOSE, CAPILLARY
Glucose-Capillary: 182 mg/dL — ABNORMAL HIGH (ref 70–99)
Glucose-Capillary: 231 mg/dL — ABNORMAL HIGH (ref 70–99)
Glucose-Capillary: 258 mg/dL — ABNORMAL HIGH (ref 70–99)
Glucose-Capillary: 273 mg/dL — ABNORMAL HIGH (ref 70–99)
Glucose-Capillary: 281 mg/dL — ABNORMAL HIGH (ref 70–99)
Glucose-Capillary: 297 mg/dL — ABNORMAL HIGH (ref 70–99)

## 2020-01-19 LAB — RAPID URINE DRUG SCREEN, HOSP PERFORMED
Amphetamines: NOT DETECTED
Barbiturates: NOT DETECTED
Benzodiazepines: NOT DETECTED
Cocaine: NOT DETECTED
Opiates: NOT DETECTED
Tetrahydrocannabinol: POSITIVE — AB

## 2020-01-19 LAB — OSMOLALITY, URINE: Osmolality, Ur: 377 mOsm/kg (ref 300–900)

## 2020-01-19 IMAGING — DX DG CHEST 1V PORT
1 series · 1 of 1 positions shown · non-contrast
Comparison: [DATE]

CLINICAL DATA: Fever

EXAM:
PORTABLE CHEST 1 VIEW

[chest ap]
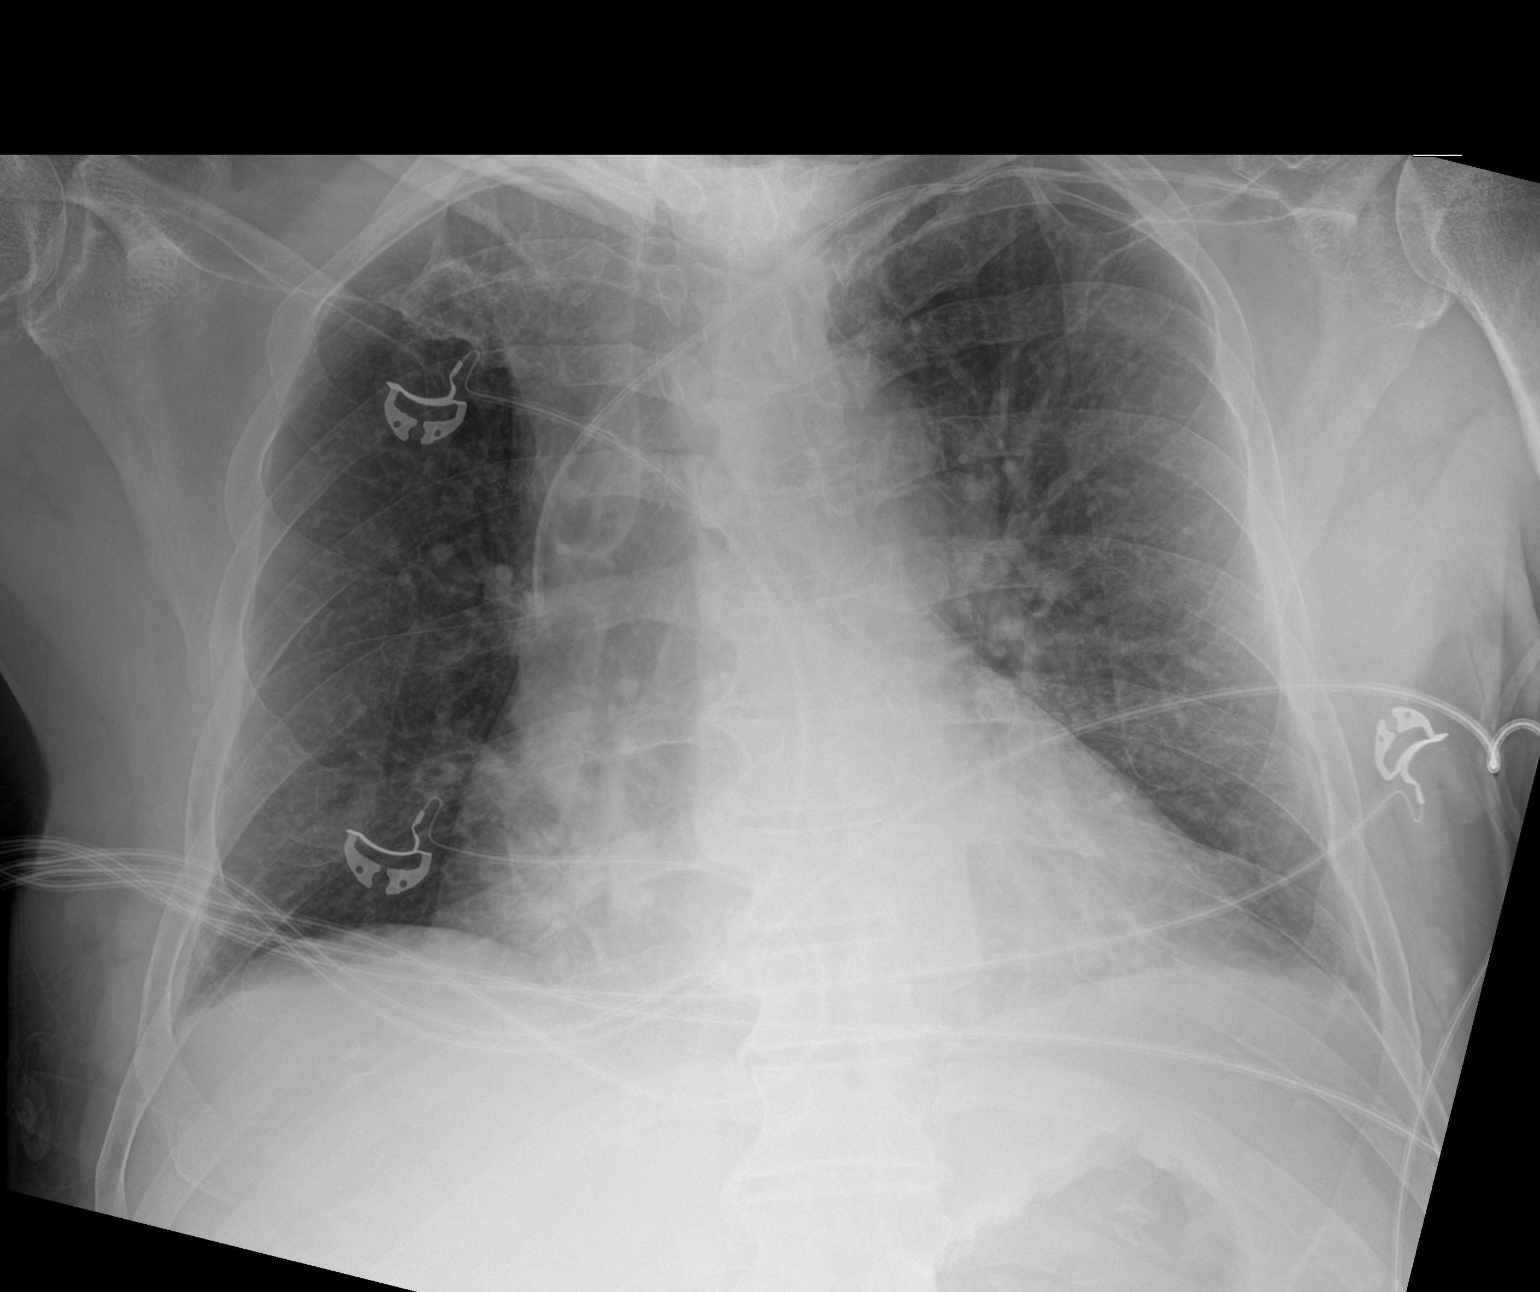

[1 of 1 positions shown; findings below may reference images not displayed]

FINDINGS: Cardiomegaly. Left chest vascular catheter remains in unchanged
position, tip over the mid SVC. Both lungs are clear. The visualized
skeletal structures are unremarkable.
IMPRESSION: Cardiomegaly without acute abnormality of the lungs in AP portable
projection.

## 2020-01-19 IMAGING — MR MR MRA HEAD W/O CM
9 of 11 series · 30 of 48 positions shown · non-contrast
Comparison: None.

CLINICAL DATA: Subarachnoid hemorrhage with vasospasm, follow-up

EXAM:
MRI HEAD WITHOUT CONTRAST
MRA HEAD WITHOUT CONTRAST
TECHNIQUE: Multiplanar, multiecho pulse sequences of the brain and surrounding
structures were obtained without intravenous contrast. Angiographic
images of the head were obtained using MRA technique without
contrast.

[Series 5: DWI · axial · 3.0mm · 0.88mm/px · z∈[-42,+105]mm · 7 of 108 slices shown (1 of 4)]
[im 1/108]
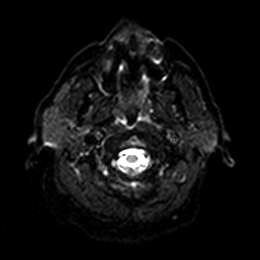
[im 18/108]
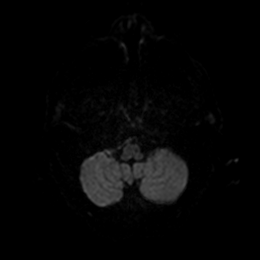
[im 36/108]
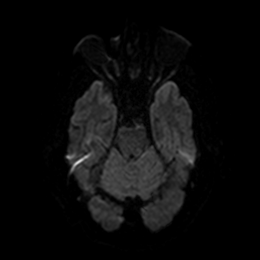
[im 54/108]
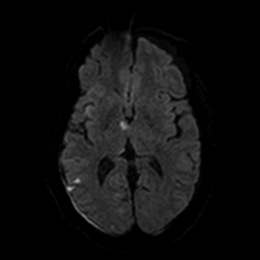
[im 72/108]
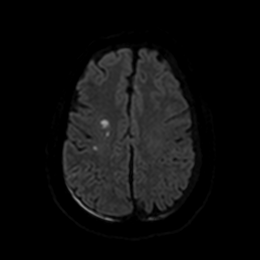
[im 90/108]
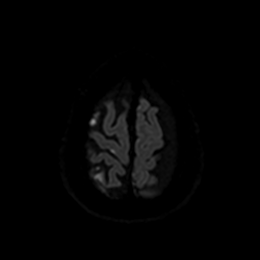
[im 108/108]
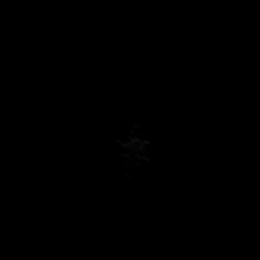

[Series 6: DWI · axial · 3.0mm · 0.88mm/px · z∈[-42,+105]mm · 3 of 51 slices shown (2 of 4)]
[im 1/51]
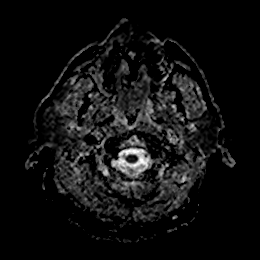
[im 26/51]
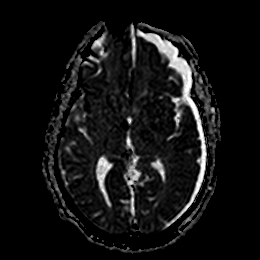
[im 51/51]
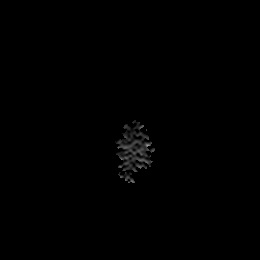

[Series 7: DWI · coronal · 4.0mm · 0.88mm/px · 5 of 76 slices shown (3 of 4)]
[im 1/76]
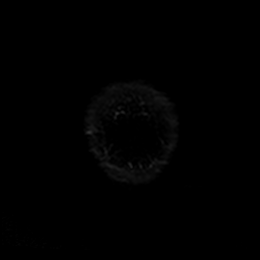
[im 19/76]
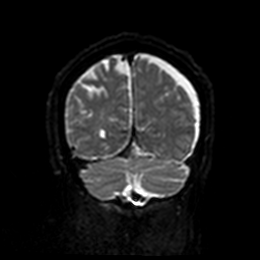
[im 38/76]
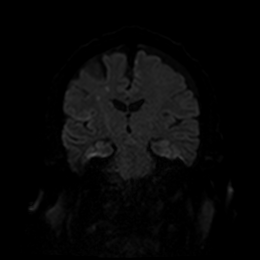
[im 57/76]
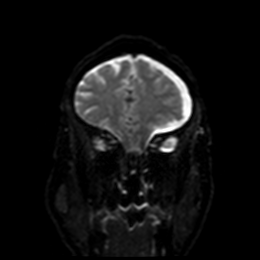
[im 76/76]
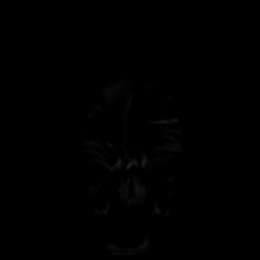

[Series 8: DWI · coronal · 4.0mm · 0.88mm/px · 3 of 38 slices shown (4 of 4)]
[im 1/38]
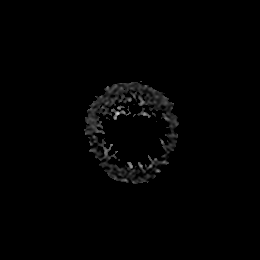
[im 19/38]
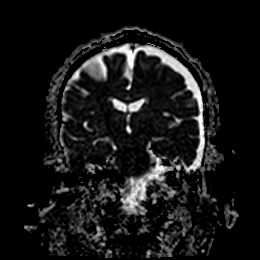
[im 38/38]
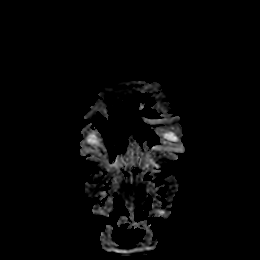

[Series 15: swi_images · axial · 3.0mm · 0.90mm/px · z∈[-47,+102]mm · 4 of 56 slices shown]
[im 1/56]
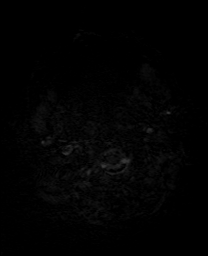
[im 19/56]
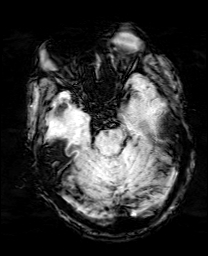
[im 37/56]
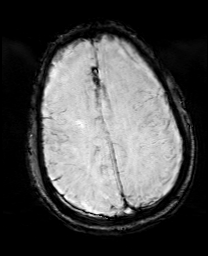
[im 56/56]
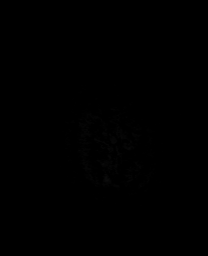

[Series 17: FLAIR · axial · 5.0mm · 0.45mm/px · z∈[-38,+98]mm · 2 of 26 slices shown]
[im 1/26]
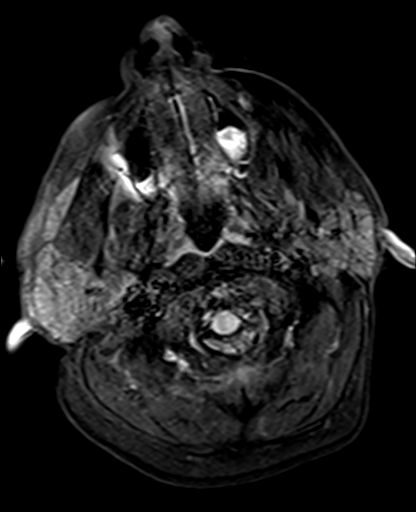
[im 26/26]
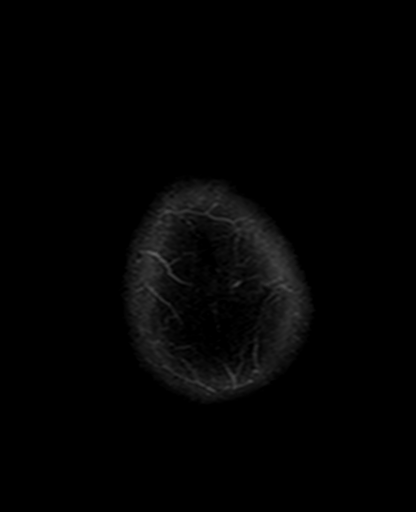

[Series 18: T1 · sagittal · 5.0mm · 0.75mm/px · 2 of 25 slices shown]
[im 1/25]
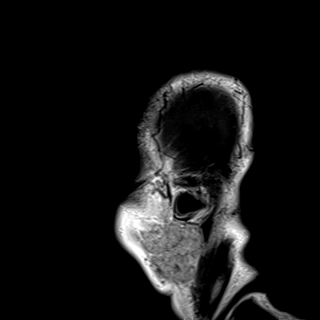
[im 25/25]
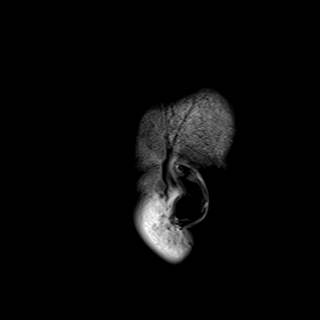

[Series 19: T2 · axial · 5.0mm · 0.72mm/px · z∈[-41,+95]mm · 2 of 26 slices shown (1 of 2)]
[im 1/26]
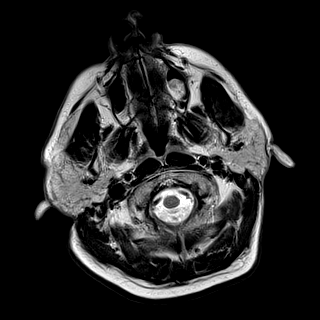
[im 26/26]
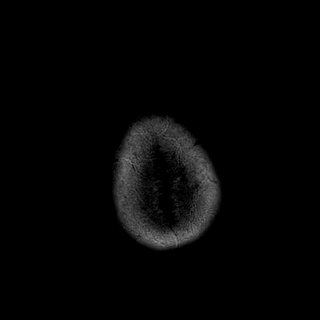

[Series 21: T2 · coronal · 5.0mm · 0.34mm/px · 2 of 31 slices shown (2 of 2)]
[im 1/31]
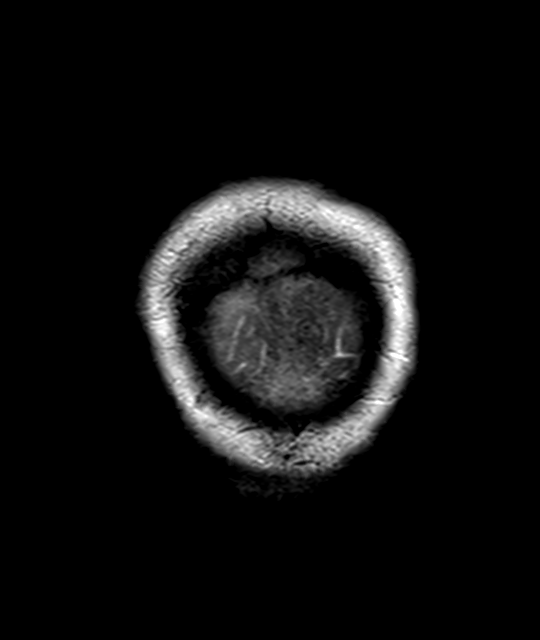
[im 31/31]
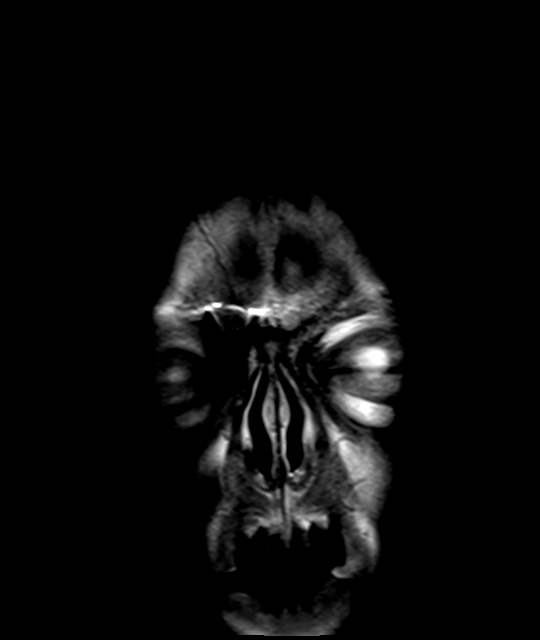

[30 of 48 positions shown; findings below may reference images not displayed]

FINDINGS: MRI HEAD

Brain: There are scattered small foci of restricted the right
cerebral hemisphere including the medial thalamus.

Thin primarily CSF intensity subdural collections again identified
along the left cerebral convexity. There is some T2 FLAIR
hyperintensity dependently likely reflecting blood products.
Additional very thin T2 for FLAIR hyperintense subdural collection
is present along the posterior right cerebral convexity. There is
stable rightward midline shift measuring 6 mm. Basal and sulcal
subarachnoid hemorrhage is present with similar distribution to
prior CTA.

Vascular: Major vessel flow voids at the skull base are preserved.
Decreased caliber of the distal basilar artery flow void.

Skull and upper cervical spine: Normal marrow signal is preserved.

Sinuses/Orbits: Mild paranasal sinus mucosal thickening. Orbits are
unremarkable.

Other: Sella is unremarkable.  Mastoid air cells are clear.

MRA HEAD

Suboptimal visualization of proximal intracranial vessels due to
intrinsic T1 hyperintensity of hemorrhage.

There is preserved flow related enhancement of the intracranial
right internal carotid artery to the level of the clinoid. There is
severe stenosis of the supraclinoid portion. This reflects
improvement from the [DATE] CTA with appearance now similar to
the [DATE] CTA.

Preserved flow related enhancement of the intracranial left
vertebral artery with moderate stenosis of the supraclinoid portion.

Right M1 MCA is difficult to evaluate but there is likely some
stenosis. Left middle cerebral artery is patent. The anterior
cerebral arteries are patent. There is likely persistent narrowing
of the left A1 ACA with severe stenosis near the origin.

Intracranial vertebral arteries are patent. The distal basilar is
not well evaluated but persistent stenosis is suspected. Right
posterior cerebral artery is patent, noting that the P1 segment is
not well evaluated. There is fetal origin of the left posterior
cerebral artery.
IMPRESSION: Multiple small acute infarcts the right cerebral hemisphere
primarily involving the anterior greater than posterior
circulations.

Thin left larger than right subdural collections with stable mild
rightward midline shift.

Residual subarachnoid hemorrhage likely similar to prior CT.

Suboptimal vascular evaluation due to intrinsic T1 hyperintensity of
hemorrhage. Improved flow within the intracranial right ICA compared
to [DATE] CTA. Appearance is now similar to [DATE] CTA with
persistent severe stenosis of the supraclinoid portion. Likely
persistent left A1 ACA stenosis and distal basilar stenosis.

## 2020-01-19 IMAGING — MR MR HEAD W/O CM
9 of 11 series · 30 of 48 positions shown · non-contrast
Comparison: None.

CLINICAL DATA: Subarachnoid hemorrhage with vasospasm, follow-up

EXAM:
MRI HEAD WITHOUT CONTRAST
MRA HEAD WITHOUT CONTRAST
TECHNIQUE: Multiplanar, multiecho pulse sequences of the brain and surrounding
structures were obtained without intravenous contrast. Angiographic
images of the head were obtained using MRA technique without
contrast.

[Series 5: DWI · axial · 3.0mm · 0.88mm/px · z∈[-42,+105]mm · 7 of 108 slices shown (1 of 4)]
[im 1/108]
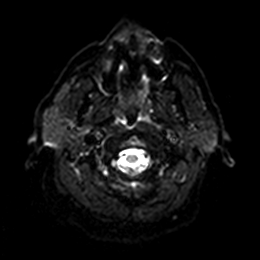
[im 18/108]
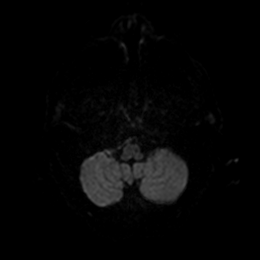
[im 36/108]
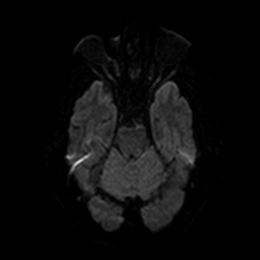
[im 54/108]
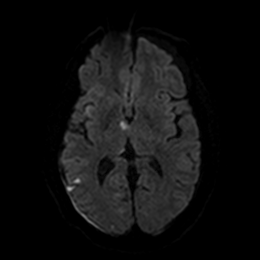
[im 72/108]
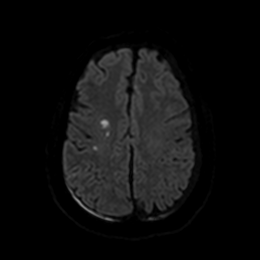
[im 90/108]
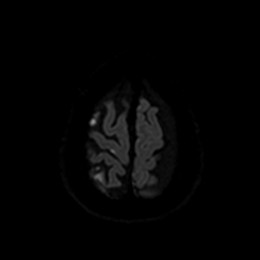
[im 108/108]
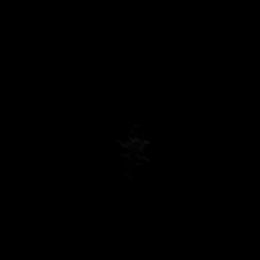

[Series 6: DWI · axial · 3.0mm · 0.88mm/px · z∈[-42,+105]mm · 3 of 51 slices shown (2 of 4)]
[im 1/51]
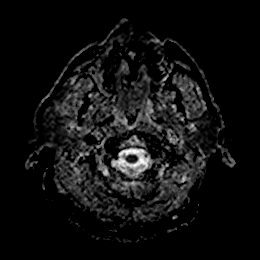
[im 26/51]
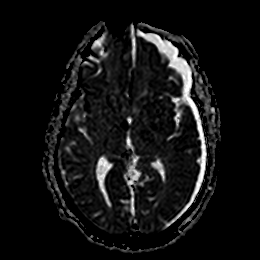
[im 51/51]
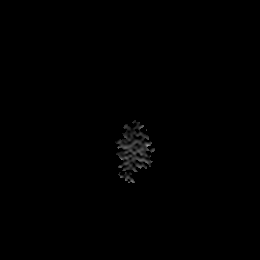

[Series 7: DWI · coronal · 4.0mm · 0.88mm/px · 5 of 76 slices shown (3 of 4)]
[im 1/76]
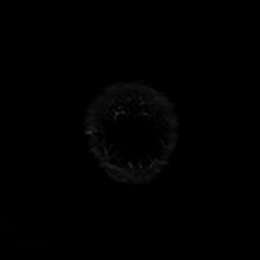
[im 19/76]
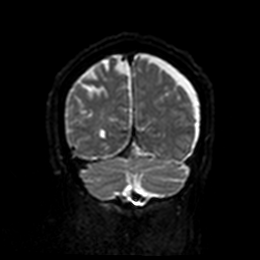
[im 38/76]
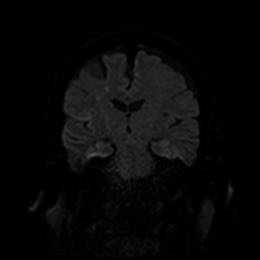
[im 57/76]
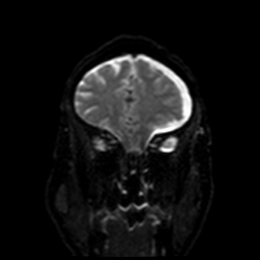
[im 76/76]
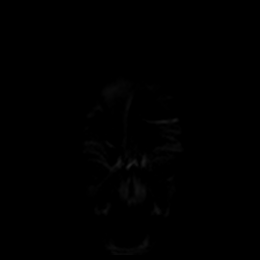

[Series 8: DWI · coronal · 4.0mm · 0.88mm/px · 3 of 38 slices shown (4 of 4)]
[im 1/38]
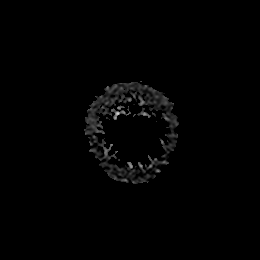
[im 19/38]
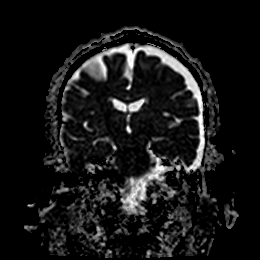
[im 38/38]
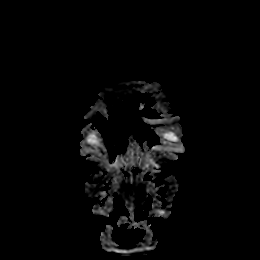

[Series 15: swi_images · axial · 3.0mm · 0.90mm/px · z∈[-47,+102]mm · 4 of 56 slices shown]
[im 1/56]
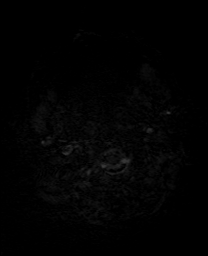
[im 19/56]
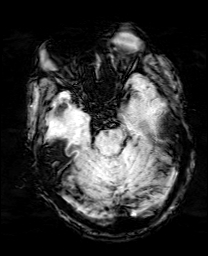
[im 37/56]
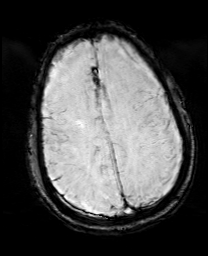
[im 56/56]
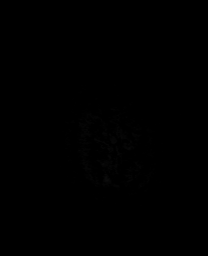

[Series 17: FLAIR · axial · 5.0mm · 0.45mm/px · z∈[-38,+98]mm · 2 of 26 slices shown]
[im 1/26]
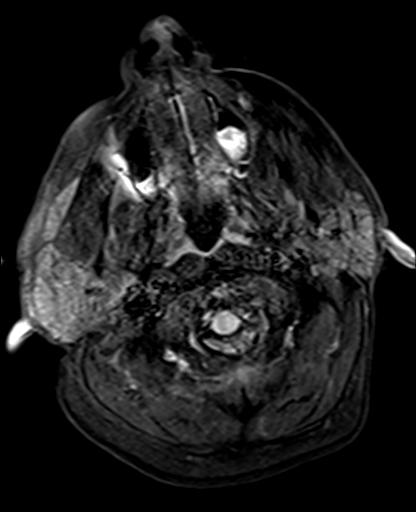
[im 26/26]
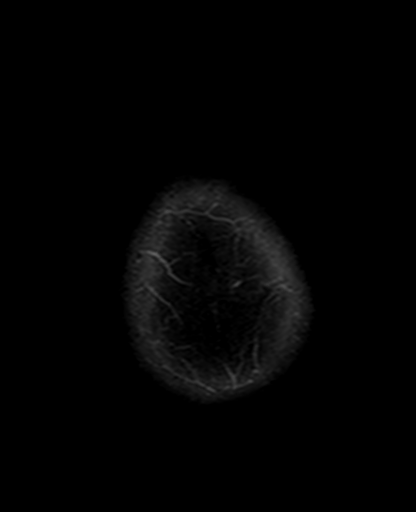

[Series 18: T1 · sagittal · 5.0mm · 0.75mm/px · 2 of 25 slices shown]
[im 1/25]
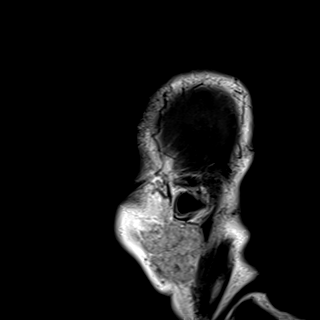
[im 25/25]
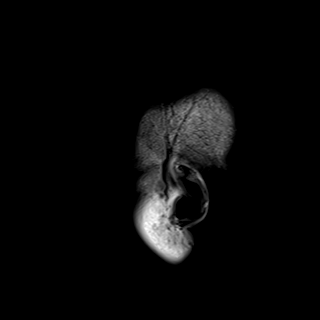

[Series 19: T2 · axial · 5.0mm · 0.72mm/px · z∈[-41,+95]mm · 2 of 26 slices shown (1 of 2)]
[im 1/26]
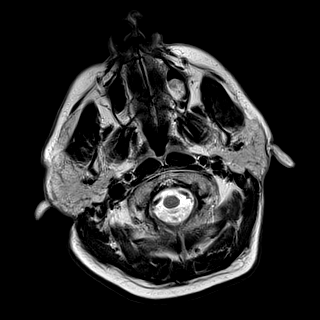
[im 26/26]
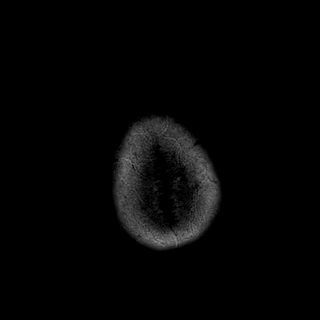

[Series 21: T2 · coronal · 5.0mm · 0.34mm/px · 2 of 31 slices shown (2 of 2)]
[im 1/31]
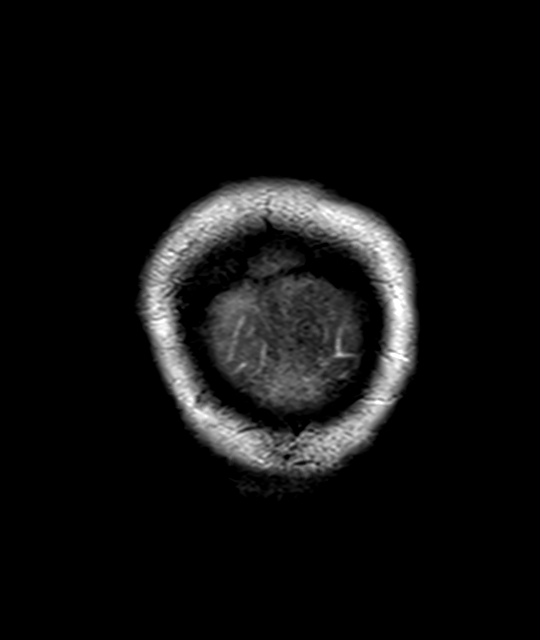
[im 31/31]
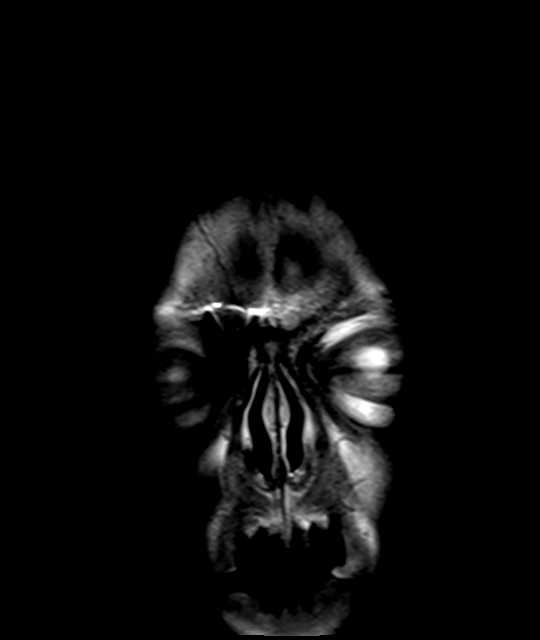

[30 of 48 positions shown; findings below may reference images not displayed]

FINDINGS: MRI HEAD

Brain: There are scattered small foci of restricted the right
cerebral hemisphere including the medial thalamus.

Thin primarily CSF intensity subdural collections again identified
along the left cerebral convexity. There is some T2 FLAIR
hyperintensity dependently likely reflecting blood products.
Additional very thin T2 for FLAIR hyperintense subdural collection
is present along the posterior right cerebral convexity. There is
stable rightward midline shift measuring 6 mm. Basal and sulcal
subarachnoid hemorrhage is present with similar distribution to
prior CTA.

Vascular: Major vessel flow voids at the skull base are preserved.
Decreased caliber of the distal basilar artery flow void.

Skull and upper cervical spine: Normal marrow signal is preserved.

Sinuses/Orbits: Mild paranasal sinus mucosal thickening. Orbits are
unremarkable.

Other: Sella is unremarkable.  Mastoid air cells are clear.

MRA HEAD

Suboptimal visualization of proximal intracranial vessels due to
intrinsic T1 hyperintensity of hemorrhage.

There is preserved flow related enhancement of the intracranial
right internal carotid artery to the level of the clinoid. There is
severe stenosis of the supraclinoid portion. This reflects
improvement from the [DATE] CTA with appearance now similar to
the [DATE] CTA.

Preserved flow related enhancement of the intracranial left
vertebral artery with moderate stenosis of the supraclinoid portion.

Right M1 MCA is difficult to evaluate but there is likely some
stenosis. Left middle cerebral artery is patent. The anterior
cerebral arteries are patent. There is likely persistent narrowing
of the left A1 ACA with severe stenosis near the origin.

Intracranial vertebral arteries are patent. The distal basilar is
not well evaluated but persistent stenosis is suspected. Right
posterior cerebral artery is patent, noting that the P1 segment is
not well evaluated. There is fetal origin of the left posterior
cerebral artery.
IMPRESSION: Multiple small acute infarcts the right cerebral hemisphere
primarily involving the anterior greater than posterior
circulations.

Thin left larger than right subdural collections with stable mild
rightward midline shift.

Residual subarachnoid hemorrhage likely similar to prior CT.

Suboptimal vascular evaluation due to intrinsic T1 hyperintensity of
hemorrhage. Improved flow within the intracranial right ICA compared
to [DATE] CTA. Appearance is now similar to [DATE] CTA with
persistent severe stenosis of the supraclinoid portion. Likely
persistent left A1 ACA stenosis and distal basilar stenosis.

## 2020-01-19 MED ORDER — NIMODIPINE 6 MG/ML PO SOLN
30.0000 mg | ORAL | Status: DC
  Administered 2020-01-19 – 2020-01-22 (×25): 30 mg
  Filled 2020-01-19 (×20): qty 10

## 2020-01-19 MED ORDER — POTASSIUM CHLORIDE 20 MEQ PO PACK
40.0000 meq | PACK | ORAL | Status: AC
  Administered 2020-01-19 (×2): 40 meq via ORAL
  Filled 2020-01-19 (×2): qty 2

## 2020-01-19 MED ORDER — NIMODIPINE 6 MG/ML PO SOLN
60.0000 mg | ORAL | Status: DC
  Filled 2020-01-19: qty 10

## 2020-01-19 MED ORDER — NOREPINEPHRINE 4 MG/250ML-% IV SOLN
0.0000 ug/min | INTRAVENOUS | Status: DC
  Administered 2020-01-19: 02:00:00 5 ug/min via INTRAVENOUS
  Administered 2020-01-19 – 2020-01-20 (×3): 25 ug/min via INTRAVENOUS
  Administered 2020-01-22: 2 ug/min via INTRAVENOUS
  Administered 2020-01-24: 8 ug/min via INTRAVENOUS
  Administered 2020-01-24: 20 ug/min via INTRAVENOUS
  Administered 2020-01-25: 10:00:00 6 ug/min via INTRAVENOUS
  Administered 2020-01-25: 18:00:00 30 ug/min via INTRAVENOUS
  Administered 2020-01-25: 16 ug/min via INTRAVENOUS
  Administered 2020-01-26: 16:00:00 24 ug/min via INTRAVENOUS
  Administered 2020-01-26: 14 ug/min via INTRAVENOUS
  Administered 2020-01-26: 18 ug/min via INTRAVENOUS
  Administered 2020-01-26: 22:00:00 15 ug/min via INTRAVENOUS
  Administered 2020-01-26: 08:00:00 18 ug/min via INTRAVENOUS
  Administered 2020-01-27: 03:00:00 14 ug/min via INTRAVENOUS
  Filled 2020-01-19 (×24): qty 250

## 2020-01-19 MED ORDER — NIMODIPINE 30 MG PO CAPS
30.0000 mg | ORAL_CAPSULE | ORAL | Status: DC
  Administered 2020-01-19 – 2020-01-22 (×9): 30 mg via ORAL
  Filled 2020-01-19 (×8): qty 1

## 2020-01-19 MED ORDER — ALBUMIN HUMAN 5 % IV SOLN
25.0000 g | Freq: Once | INTRAVENOUS | Status: AC
  Administered 2020-01-19: 25 g via INTRAVENOUS
  Filled 2020-01-19: qty 500

## 2020-01-19 MED ORDER — CALCIUM CARBONATE 1250 (500 CA) MG PO TABS
1.0000 | ORAL_TABLET | Freq: Two times a day (BID) | ORAL | Status: AC
  Administered 2020-01-19 – 2020-01-21 (×4): 500 mg via ORAL
  Filled 2020-01-19 (×4): qty 1

## 2020-01-19 MED ORDER — SODIUM CHLORIDE 0.9% FLUSH
10.0000 mL | Freq: Two times a day (BID) | INTRAVENOUS | Status: DC
  Administered 2020-01-19 – 2020-01-28 (×13): 10 mL

## 2020-01-19 MED ORDER — POTASSIUM CHLORIDE CRYS ER 20 MEQ PO TBCR: 40.0000 meq | EXTENDED_RELEASE_TABLET | ORAL | Status: DC

## 2020-01-19 MED ORDER — PHENYLEPHRINE CONCENTRATED 100MG/250ML (0.4 MG/ML) INFUSION SIMPLE
0.0000 ug/min | INTRAVENOUS | Status: DC
  Administered 2020-01-19 (×2): 400 ug/min via INTRAVENOUS
  Administered 2020-01-19: 180 ug/min via INTRAVENOUS
  Administered 2020-01-20 – 2020-01-21 (×6): 400 ug/min via INTRAVENOUS
  Administered 2020-01-21: 390 ug/min via INTRAVENOUS
  Administered 2020-01-21 – 2020-01-22 (×2): 400 ug/min via INTRAVENOUS
  Administered 2020-01-22: 390 ug/min via INTRAVENOUS
  Administered 2020-01-22: 400 ug/min via INTRAVENOUS
  Administered 2020-01-22: 390 ug/min via INTRAVENOUS
  Administered 2020-01-22: 400 ug/min via INTRAVENOUS
  Administered 2020-01-23 (×2): 390 ug/min via INTRAVENOUS
  Administered 2020-01-23 – 2020-01-27 (×16): 400 ug/min via INTRAVENOUS
  Administered 2020-01-27: 375 ug/min via INTRAVENOUS
  Administered 2020-01-28: 300 ug/min via INTRAVENOUS
  Administered 2020-01-28: 350 ug/min via INTRAVENOUS
  Filled 2020-01-19 (×52): qty 250

## 2020-01-19 MED ORDER — NIMODIPINE 30 MG PO CAPS: 30.0000 mg | ORAL_CAPSULE | ORAL | Status: DC

## 2020-01-19 MED ORDER — INSULIN DETEMIR 100 UNIT/ML ~~LOC~~ SOLN
20.0000 [IU] | Freq: Two times a day (BID) | SUBCUTANEOUS | Status: DC
  Administered 2020-01-19 (×2): 20 [IU] via SUBCUTANEOUS
  Filled 2020-01-19 (×4): qty 0.2

## 2020-01-19 MED ORDER — SODIUM CHLORIDE 0.9% FLUSH: 10.0000 mL | INTRAVENOUS | Status: DC | PRN

## 2020-01-19 MED ORDER — ALBUMIN HUMAN 5 % IV SOLN
25.0000 g | Freq: Four times a day (QID) | INTRAVENOUS | Status: AC
  Administered 2020-01-19 – 2020-01-20 (×4): 25 g via INTRAVENOUS
  Filled 2020-01-19 (×4): qty 500

## 2020-01-19 NOTE — Progress Notes (Signed)
eLink Physician-Brief Progress Note Patient Name: Nicholas [name] DOB: 12/21/1950 MRN: [id number]   Date of Service  01/19/2020  HPI/Events of Note  Pt SBP down to 160 mmHg despite max dose Phenylephrine, Pt with normal bi-ventricular function on ECHO and LV is hyperdynamic, IVC also consistent with low RA pressures.  eICU Interventions  Norepinephrine infusion added, Primacor discontinued, Albumin 5 % 25 gm iv bolus x 1 ordered.        Thomasene Lot Quince Santana 01/19/2020, 1:34 AM
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
## 2020-01-19 NOTE — Plan of Care (Signed)
  Problem: Education: Goal: Knowledge of disease or condition will improve Outcome: Progressing   

## 2020-01-19 NOTE — Progress Notes (Signed)
STROKE TEAM PROGRESS NOTE   INTERVAL HISTORY His wife and son are at the bedside.  Pt lying in bed, lethargic and drowsy, but still arousable, following commands on the right. Left hemiplegia. However, as per Dr. Maurice Small and RN, pt LLE was 3/5 this morning. BP stable at 180s, but low BP recorded after nimodipine 60mg  dose. Will change to 30mg  Q2h. Will give albumin 4 doses, continue pressors for BP goal 180-220. Will do MRI and MRA for further evaluation. Tmax this am 100.7, will check UA and CXR.    OBJECTIVE Vitals:   01/19/20 0345 01/19/20 0400 01/19/20 0415 01/19/20 0459  BP: (!) 197/85 (!) 196/93 (!) 199/86   Pulse: 80 80 82   Resp: 20 (!) 21 (!) 21   Temp:  (!) 101 F (38.3 C)    TempSrc:  Oral    SpO2: 92% 93% 93%   Weight:    86.4 kg  Height:        CBC:  Recent Labs  Lab 01/14/20 1021 01/14/20 1022 01/17/20 0250 01/19/20 0456  WBC 14.6*   < > 17.2* 14.8*  NEUTROABS 13.7*  --   --   --   HGB 15.9   < > 14.2 13.0  HCT 46.7   < > 44.1 40.2  MCV 85.4   < > 90.6 90.5  PLT 232   < > 196 224   < > = values in this interval not displayed.    Basic Metabolic Panel:  Recent Labs  Lab 01/16/20 0416 01/17/20 0250  NA 143 141  K 4.3 4.0  CL 109 107  CO2 22 25  GLUCOSE 135* 182*  BUN 41* 23  CREATININE 1.00 0.76  CALCIUM 8.9 8.3*  MG 2.1 1.8  PHOS 3.6 2.8    Lipid Panel:     Component Value Date/Time   CHOL 153 03/25/2014 0934   TRIG 413 (H) 01/17/2020 0250   HDL 27 (L) 03/25/2014 0934   CHOLHDL 5.7 03/25/2014 0934   VLDL 63 (H) 03/25/2014 0934   LDLCALC 63 03/25/2014 0934   HgbA1c:  Lab Results  Component Value Date   HGBA1C 7.7 (H) 01/14/2020   Urine Drug Screen: No results found for: LABOPIA, COCAINSCRNUR, LABBENZ, AMPHETMU, THCU, LABBARB  Alcohol Level No results found for: ETH  IMAGING  CT ANGIO HEAD W OR WO CONTRAST 01/18/2020 IMPRESSION:   Overall decreased volume of subarachnoid hemorrhage since 01/15/2020.   New left subdural hygroma  and subcentimeter rightward midline shift.   Resolution of hydrocephalus.   Further significantly diminished flow within the right ICA with no enhancement visualized at and beyond the cavernous segment.   Mild increased right M1 MCA narrowing and diminished distal right MCA branch filling.   New narrowing of the left A1 ACA with severe stenosis near the origin.   Persistent irregularity of the distal basilar artery with new superimposed severe stenosis.   IR PTA Vasospasm Initial IR PTA Vasospasm Add Diff IR ENDOVASC INTRACRANIAL INF OTHER THAN THROMBO ART INC DIAG ANGIO IR ENDOVASC INTRACRANIAL INF OTHER THAN THROMBO ART INC DIAG ANGIO EA ADD 01/18/2020 IMPRESSION:   Severe vasospasm involving the supraclinoid segments of the internal carotid arteries bilaterally and the length of the basilar artery.   There is significant improvement in vessel caliber and distal territory perfusion after balloon angioplasty of the right internal carotid artery, right middle cerebral artery, and basilar artery.   DG CHEST PORT 1 VIEW 01/18/2020 IMPRESSION:  1. Left subclavian approach central  line placed, tip at the lower SVC level.  2. No pneumothorax or acute cardiopulmonary abnormality.  DG CHEST PORT 1 VIEW 01/18/2020 IMPRESSION:  1.  Cardiomegaly.  No pulmonary venous congestion.  2.  Low lung volumes.  Mild bibasilar infiltrates.   Transthoracic Echocardiogram  01/15/2020 IMPRESSIONS  1. Normal LV systolic function; proximal septal thickening; grade 1  diastolic dysfunction; mildly dilated aortic root; scerotic aortic valve.  2. Left ventricular ejection fraction, by estimation, is 70 to 75%. The  left ventricle has hyperdynamic function. The left ventricle has no  regional wall motion abnormalities. There is mild left ventricular  hypertrophy of the basal segment. Left  ventricular diastolic parameters are consistent with Grade I diastolic  dysfunction (impaired relaxation).  3.  Right ventricular systolic function is normal. The right ventricular  size is normal.  4. The mitral valve is normal in structure. No evidence of mitral valve  regurgitation. No evidence of mitral stenosis.  5. The aortic valve is tricuspid. Aortic valve regurgitation is not  visualized. Mild to moderate aortic valve sclerosis/calcification is  present, without any evidence of aortic stenosis.  6. Aortic dilatation noted. There is mild dilatation of the aortic root  measuring 38 mm.  7. The inferior vena cava is normal in size with greater than 50%  respiratory variability, suggesting right atrial pressure of 3 mmHg.   Transcranial Doppler 01/16/2020 Summary:  Elevated right middle cerebral and carotid siphon and left middle cerebral artery and carotid siphon mean flow velocities suggest mild vasospasm.  Slight improvement compared with prior TCD study 01/14/20   ECG - ST rate 105 BPM. (See cardiology reading for complete details)  EEG - not ordered    PHYSICAL EXAM  Temp:  [98.2 F (36.8 C)-101.6 F (38.7 C)] 100.7 F (38.2 C) (05/01 0800) Pulse Rate:  [66-116] 78 (05/01 0845) Resp:  [13-24] 23 (05/01 0845) BP: (136-222)/(53-110) 209/92 (05/01 0845) SpO2:  [87 %-97 %] 96 % (05/01 0845) Arterial Line BP: (101-301)/(59-165) 101/98 (05/01 0130) Weight:  [86.4 kg] 86.4 kg (05/01 0459)  General - Well nourished, well developed lethargic and drowsy but arousable.  Ophthalmologic - fundi not visualized due to noncooperation.  Cardiovascular - Regular rate and rhythm.  Neuro - lethargic and drowsy but arousable. Need repetitive stimulation to keep eye opening. Able to answer questions appropriately, orientated to place and people and age. B/l CN VI imcomplete gaze. Mild right ptosis, but PERRL. Seems to have left hemianopia, not blinking to visual threat on the left. Left facial droop, tongue protrusion to the left. Left hemiplegic except slight withdraw to pain on LLE. RUE and  RLE at least 4/5 and following commands. B/l babinski positive. Sensation, coordination not cooperative and gait not tested.   ASSESSMENT/PLAN Nicholas [name] is a 69 y.o. male with history of diabetes, migraines, herpes simplex virus 1, hypertension, hyperlipidemia, hepatitis A, and benign prostatic hypertrophy transported [facility] after developing sudden onset of severe headache initially with mild left upper extremity drift but still able to carry on a conversation. Later he developed fixed right gaze deviation as well as increased left arm and left leg weakness but was still able to communicate and speak.  He did not receive IV t-PA due to ICH.  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 50% diameter stenosis proximal left internal carotid artery. Mild to moderate stenosis in the cavernous carotid bilaterally due to atherosclerotic calcification  CTA head 4/30 - Overall decreased volume of subarachnoid hemorrhage since 01/15/2020. Diminished flow within the right ICA with no enhancement visualized at and beyond the cavernous segment. Mild increased right M1 MCA narrowing and diminished distal right MCA branch filling. New narrowing of the left A1 ACA with severe stenosis near the origin. Persistent irregularity of the distal basilar artery with new superimposed severe stenosis.  IR - significant improvement in vessel caliber and distal territory perfusion after balloon angioplasty of the right internal carotid artery, right middle cerebral artery, and basilar artery  MRI head - pending  MRA head - pending  TCD 4/28 - Elevated right middle cerebral and carotid siphon and left middle cerebral artery and carotid siphon mean flow velocities  suggest mild vasospasm. Slight improvement compared with prior TCD study 01/14/20   Carotid Doppler - not ordered  TCD 4/30 pending  2D Echo - EF 70 - 75%. Grade I diastolic dysfunction (impaired relaxation).   Sars Corona Virus 2 - negative  LDL - 37  HgbA1c - 7.7  UDS - pending  VTE prophylaxis - SCDs  aspirin 81 mg daily prior to admission, now on No antithrombotic  Ongoing aggressive stroke risk factor management  Therapy recommendations:  pending  Disposition:  Pending  Stroke - due to vasospasm  CTA head 4/30 - Overall decreased volume of subarachnoid hemorrhage since 01/15/2020. Diminished flow within the right ICA with no enhancement visualized at and beyond the cavernous segment. Mild increased right M1 MCA narrowing and diminished distal right MCA branch filling. New narrowing of the left A1 ACA with severe stenosis near the origin. Persistent irregularity of the distal basilar artery with new superimposed severe stenosis.  IR - significant improvement in vessel caliber and distal territory perfusion after balloon angioplasty of the right internal carotid artery, right middle cerebral artery, and basilar artery  MRI head - pending  MRA head - pending  BP goal 180-220  On Neo, Levophed  Will add albumin 4 doses  Change nimodipine 60mg  Q4h to 30mg  Q2h  Hypertension Triple H therepy  Home BP meds: Lisinopril  Current BP meds: Nimodipine   SBP goal - 180 - 220   On Levophed and Neo  Hyperlipidemia  Home Lipid lowering medication: Lipitor 80 mg daily and Fenofibrate (Crestor 40 mg daily) Lovaza  LDL - 37, goal < 70  Current lipid lowering medication: Crestor 40 mg daily and Fenofibrate ; (statin contraindicated with ICH)  Continue statin at discharge  Diabetes  Home diabetic meds: insulin, glucotrol, Actos, Janumet, Xigduo  Current diabetic meds: insulin  HgbA1c 7.7, goal < 7.0  SSI  CBG monitoring  Fever   Temp  101->100.7  Leukocytosis WBCs - 14.6->17.2->14.8  Urine culture 4/26 -> NG  CXR 5/1 pending  UA pending  Other Stroke Risk Factors  Advanced age  ETOH use, advised to drink no more than 1 alcoholic beverage per day.  Migraines  Other Active Problems  Code status - Full code  Keppra seizure prophelaxis  Hospital day # 5  This patient is critically ill due to stroke, SAH with cerebral vasospasm, left hemiplegia and at significant risk of neurological worsening, death form cerebral edema, recurrent stroke, recurrent bleeding seizure, hydrocephalus. This patient's care requires constant monitoring of vital signs, hemodynamics, respiratory and cardiac monitoring, review of multiple databases, neurological assessment, discussion  with family, other specialists and medical decision making of high complexity. I spent 45 minutes of neurocritical care time in the care of this patient. I had long discussion with wife and son at bedside, updated pt current condition, treatment plan and potential prognosis, and answered all the questions. They expressed understanding and appreciation. I also discussed with Dr. Zada Finders.   Rosalin Hawking, MD PhD Stroke Neurology 01/19/2020 12:01 PM  To contact Stroke Continuity provider, please refer to [email or web address] After hours, contact General Neurology

## 2020-01-19 NOTE — Plan of Care (Signed)
  Problem: Nutrition: Goal: Risk of aspiration will decrease Outcome: Progressing   

## 2020-01-19 NOTE — Progress Notes (Addendum)
NAME:  Nicholas [name], MRN:  [id number], DOB:  [birth year], LOS: 5 ADMISSION DATE:  01/14/2020, CONSULTATION DATE:  01/14/2020 REFERRING MD:  Lowella Petties , CHIEF COMPLAINT:  SAH   Brief History   69 year old man who presented with 24h of progressive nuchal rigidity and was found to have a HH 2 Fisher 3 SAH  Both CTA and catheter angiography negative for aneurysm due to presence of vasospasm.   Has maintained normal examination   Past Medical History   Past Medical History:  Diagnosis Date  . BPH (benign prostatic hypertrophy) 12/20/2011   Dr. Cleatrice Burke  . Colon polyp    Dr. Vernell Barrier, it was recommended that he have a follow-up colonoscopy in 2010.  Marland Kitchen Deviated septum   . Diabetes mellitus without complication [facility])    Eye exam - 2011  . Hepatitis A   . HSV-1 (herpes simplex virus 1) infection   . Hyperlipidemia   . Hypertension   . Migraine headache     Significant Hospital Events   Admitted 4/26  Consults:  PCCM  Procedures:  CVL 4/30 A-line 4/30 Diagnostic cerebral angiogram with balloon angioplasty 4/30  Significant Diagnostic Tests:  CTA head and neck 4/26 > 1. Large volume subarachnoid hemorrhage, right greater than left. Possible right tentorial subdural hemorrhage versus subarachnoid hemorrhage. 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 Recommend catheter angiogram for further evaluation. 2. Negative for hydrocephalus.  No acute infarct. 3. Small caliber basilar artery could represent basal spasm. 4. 25% diameter stenosis proximal right internal carotid artery. 50% diameter stenosis proximal left internal carotid artery. Mild to moderate stenosis in the cavernous carotid bilaterally due to atherosclerotic calcification 5. These results were called by telephone at the time of interpretation on 01/14/2020 at 11:47 am to provider ABIGAIL HARRIS , who verbally acknowledged these results.  CTA head 4/30 > Overall  decreased volume of subarachnoid hemorrhage since 01/15/2020. New left subdural hygroma and subcentimeter rightward midline shift. Resolution of hydrocephalus.  Further significantly diminished flow within the right ICA with no enhancement visualized at and beyond the cavernous segment. Mild increased right M1 MCA narrowing and diminished distal right MCA branch filling. New narrowing of the left A1 ACA with severe stenosis near the origin.  Persistent irregularity of the distal basilar artery with new superimposed severe stenosis.  Micro Data:  COVID 4/26 > negative MRSA PCR 4/26 > negative  Urine culture 4/26 > negative  Blood culture 4/26 > negative   Antimicrobials:    Interim history/subjective:  Seen lying in bed in no acute distress, he is alert and oriented x3. Denies any acute complaints  Positive 6L. Significant urine output of ~6L in the last 24 hrs with 2.1L out since midnight   Objective   Blood pressure (!) 193/70, pulse 77, temperature (!) 101 F (38.3 C), temperature source Oral, resp. rate (!) 23, height 6' (1.829 m), weight 86.4 kg, SpO2 93 %. CVP:  [6 mmHg-13 mmHg] 13 mmHg      Intake/Output Summary (Last 24 hours) at 01/19/2020 0707 Last data filed at 01/19/2020 0600 Gross per 24 hour  Intake 5101.74 ml  Output 6775 ml  Net -1673.26 ml   Filed Weights   01/14/20 0936 01/15/20 1237 01/19/20 0459  Weight: 90.7 kg 90.7 kg 86.4 kg    Examination: General: Middle aged male ling in bed in NAD  HEENT: [place]/AT, MM pink/moist, PERRL, able to actively turn neck from right side preference to  midline Neuro: Alert and oriented x3, 4/5 weakness of left upper and lower extremity CV: s1s2 regular rate and rhythm, no murmur, rubs, or gallops,  PULM:  Clear to ascultation bilaterally, no increased work of breathing, no added breath sounds GI: soft, bowel sounds active in all 4 quadrants, non-tender, non-distended Extremities: warm/dry, no edema  Skin: no rashes or  lesions  Resolved Hospital Problem list     Assessment & Plan:   Critically ill due to acute SAH complicated by vasospasm.  -S/P diagnostic cerebral angiogram with balloon angioplasty mentation is greatly improved morning of 5/1 he is alert and oriented x3 P: Management per neurology  Maintain neuro protective measures; goal for eurothermia, euglycemia, eunatermia, normoxia,  Nutrition and bowel regiment  Seizure precautions  AEDs per neurology  Continue goal for hypervolemia, hemodilution, and hypertension Continue pressor support to support cerebral blood flow, second pressor added overnight  Frequent neuro checks    Hypoxia likely due to atelectasis P: Continue supplemental oxygen for SPO2 goal >92 Encourage pulmonary hygiene  Follow intermittent CXR  Type 2 diabetes with poor control at baseline P: SSI CBG checks Q4 Continue long acting insulin   New onset fever  -Spike in temp overnight with T-max 101.6. No acute signs of infection at present.  P: Trend WBC and fever curve  No indications for antibiotics at this time   Significant urine output with concern for development of DI - Significant urine output of ~6L in the last 24 hrs with 2.1L out since midnight  P: Obtain urine specific gravity  Check urine osmolality Closely monitor urine output   Best practice:  Diet: SLP eval to assess appropriate diet Pain/Anxiety/Delirium protocol (if indicated): PRNs VAP protocol (if indicated): Pulmonary hygiene  DVT prophylaxis: SCD GI prophylaxis:PPI Glucose control: SSI Mobility: Bedrest Code Status: Full Family Communication: Per primary  Disposition: Neuro ICU   Labs   CBC: Recent Labs  Lab 01/14/20 1021 01/14/20 1021 01/14/20 1022 01/15/20 0452 01/16/20 0416 01/17/20 0250 01/19/20 0456  WBC 14.6*  --   --  12.1* 23.8* 17.2* 14.8*  NEUTROABS 13.7*  --   --   --   --   --   --   HGB 15.9   < > 16.7 15.2 14.2 14.2 13.0  HCT 46.7   < > 49.0 45.6 43.4  44.1 40.2  MCV 85.4  --   --  89.6 90.6 90.6 90.5  PLT 232  --   --  256 231 196 224   < > = values in this interval not displayed.    Basic Metabolic Panel: Recent Labs  Lab 01/14/20 1021 01/14/20 1021 01/14/20 1022 01/15/20 0452 01/16/20 0416 01/17/20 0250 01/19/20 0456  NA 139   < > 141 145 143 141 143  K 3.9   < > 3.9 3.8 4.3 4.0 3.6  CL 101  --   --  112* 109 107 106  CO2 20*  --   --  23 22 25 27   GLUCOSE 343*  --   --  238* 135* 182* 209*  BUN 27*  --   --  35* 41* 23 14  CREATININE 1.13  --   --  1.18 1.00 0.76 0.80  CALCIUM 9.7  --   --  9.5 8.9 8.3* 8.6*  MG  --   --   --  2.2 2.1 1.8  --   PHOS  --   --   --  1.4* 3.6 2.8  --    < > =  values in this interval not displayed.   GFR: Estimated Creatinine Clearance: 99.7 mL/min (by C-G formula based on SCr of 0.8 mg/dL). Recent Labs  Lab 01/14/20 1021 01/14/20 1021 01/14/20 1230 01/15/20 0452 01/16/20 0416 01/17/20 0250 01/19/20 0456  WBC 14.6*   < >  --  12.1* 23.8* 17.2* 14.8*  LATICACIDVEN 3.2*  --  2.7*  --   --   --   --    < > = values in this interval not displayed.    Liver Function Tests: Recent Labs  Lab 01/14/20 1021  AST 28  ALT 41  ALKPHOS 44  BILITOT 1.3*  PROT 8.8*  ALBUMIN 5.2*   No results for input(s): LIPASE, AMYLASE in the last 168 hours. Recent Labs  Lab 01/14/20 1021  AMMONIA 17    ABG    Component Value Date/Time   HCO3 22.9 01/14/2020 1022   TCO2 24 01/14/2020 1022   O2SAT 96.9 01/19/2020 0456     Coagulation Profile: Recent Labs  Lab 01/14/20 1022  INR 1.1    Cardiac Enzymes: No results for input(s): CKTOTAL, CKMB, CKMBINDEX, TROPONINI in the last 168 hours.  HbA1C: Hemoglobin A1C  Date/Time Value Ref Range Status  01/22/2014 10:09 AM 7.4  Final   Hgb A1c MFr Bld  Date/Time Value Ref Range Status  01/14/2020 05:15 PM 7.7 (H) 4.8 - 5.6 % Final    Comment:    (NOTE) Pre diabetes:          5.7%-6.4% Diabetes:              >6.4% Glycemic control for    <7.0% adults with diabetes   10/16/2013 08:55 AM 8.2 (H) <5.7 % Final    Comment:                                                                           According to the ADA Clinical Practice Recommendations for 2011, when HbA1c is used as a screening test:     >=6.5%   Diagnostic of Diabetes Mellitus            (if abnormal result is confirmed)   5.7-6.4%   Increased risk of developing Diabetes Mellitus   References:Diagnosis and Classification of Diabetes Mellitus,Diabetes Care,2011,34(Suppl 1):S62-S69 and Standards of Medical Care in         Diabetes - 2011,Diabetes Care,2011,34 (Suppl 1):S11-S61.      CBG: Recent Labs  Lab 01/18/20 1527 01/18/20 1717 01/18/20 1903 01/18/20 2310 01/19/20 0311  GLUCAP 167* 183* 182* 177* 182*     Critical care time:    Performed by: Delfin Gant  Total critical care time: 38 minutes  Critical care time was exclusive of separately billable procedures and treating other patients.  Critical care was necessary to treat or prevent imminent or life-threatening deterioration.  Critical care was time spent personally by me on the following activities: development of treatment plan with patient and/or surrogate as well as nursing, discussions with consultants, evaluation of patient's response to treatment, examination of patient, obtaining history from patient or surrogate, ordering and performing treatments and interventions, ordering and review of laboratory studies, ordering and review of radiographic studies, pulse oximetry and re-evaluation of patient's condition.  Delfin Gant, NP-C [facility] Contact / Pager information can be found on Amion  01/19/2020, 7:24 AM

## 2020-01-19 NOTE — Progress Notes (Signed)
Neurosurgery Service Progress Note  Subjective: Clinical spasm yesterday s/p angioplasty, maintaining pressures well, no complaints this morning  Objective: Vitals:   01/19/20 0800 01/19/20 0815 01/19/20 0830 01/19/20 0845  BP: (!) 203/88 (!) 218/110 (!) 199/90 (!) 209/92  Pulse: 78 79 82 78  Resp: 18 20 16  (!) 23  Temp: (!) 100.7 F (38.2 C)     TempSrc: Axillary     SpO2: 96% 97% 97% 96%  Weight:      Height:       Temp (24hrs), Avg:99.7 F (37.6 C), Min:98.2 F (36.8 C), Max:101.6 F (38.7 C)  CBC Latest Ref Rng & Units 01/19/2020 01/17/2020 01/16/2020  WBC 4.0 - 10.5 K/uL 14.8(H) 17.2(H) 23.8(H)  Hemoglobin 13.0 - 17.0 g/dL 13.0 14.2 14.2  Hematocrit 39.0 - 52.0 % 40.2 44.1 43.4  Platelets 150 - 400 K/uL 224 196 231   BMP Latest Ref Rng & Units 01/19/2020 01/17/2020 01/16/2020  Glucose 70 - 99 mg/dL 209(H) 182(H) 135(H)  BUN 8 - 23 mg/dL 14 23 41(H)  Creatinine 0.61 - 1.24 mg/dL 0.80 0.76 1.00  Sodium 135 - 145 mmol/L 143 141 143  Potassium 3.5 - 5.1 mmol/L 3.6 4.0 4.3  Chloride 98 - 111 mmol/L 106 107 109  CO2 22 - 32 mmol/L 27 25 22   Calcium 8.9 - 10.3 mg/dL 8.6(L) 8.3(L) 8.9    Intake/Output Summary (Last 24 hours) at 01/19/2020 0910 Last data filed at 01/19/2020 0600 Gross per 24 hour  Intake 4853.26 ml  Output 6375 ml  Net -1521.74 ml    Current Facility-Administered Medications:  .  0.9 %  sodium chloride infusion, , Intravenous, PRN, Maudie Flakes, MD, Last Rate: 0 mL/hr at 01/14/20 1126, New Bag at 01/18/20 1157 .  0.9 %  sodium chloride infusion, , Intravenous, Continuous, Consuella Lose, MD, Last Rate: 125 mL/hr at 01/19/20 0600, Rate Verify at 01/19/20 0600 .  acetaminophen (TYLENOL) tablet 650 mg, 650 mg, Oral, Q4H PRN **OR** acetaminophen (TYLENOL) 160 MG/5ML solution 650 mg, 650 mg, Per Tube, Q4H PRN, 650 mg at 01/19/20 0447 **OR** acetaminophen (TYLENOL) suppository 650 mg, 650 mg, Rectal, Q4H PRN, Kary Kos, MD .  Chlorhexidine Gluconate Cloth 2 %  PADS 6 each, 6 each, Topical, Daily, Kary Kos, MD, 6 each at 01/18/20 1130 .  cholecalciferol (VITAMIN D3) tablet 1,000 Units, 1,000 Units, Oral, Daily, Kary Kos, MD, 1,000 Units at 01/17/20 0956 .  fenofibrate tablet 160 mg, 160 mg, Oral, Daily, Kary Kos, MD, 160 mg at 01/17/20 0957 .  HYDROmorphone (DILAUDID) injection 0.5 mg, 0.5 mg, Intravenous, Q2H PRN, Agarwala, Ravi, MD, 0.5 mg at 01/18/20 0440 .  insulin aspart (novoLOG) injection 0-20 Units, 0-20 Units, Subcutaneous, TID WC, Kipp Brood, MD, 7 Units at 01/19/20 0856 .  insulin aspart (novoLOG) injection 10 Units, 10 Units, Subcutaneous, TID WC, Agarwala, Ravi, MD .  insulin detemir (LEVEMIR) injection 20 Units, 20 Units, Subcutaneous, BID, Davis, Whitney F, NP .  levETIRAcetam (KEPPRA) IVPB 500 mg/100 mL premix, 500 mg, Intravenous, Q12H, Consuella Lose, MD, Stopped at 01/19/20 0503 .  niMODipine (NIMOTOP) capsule 60 mg, 60 mg, Oral, Q4H, 60 mg at 01/18/20 0821 **OR** niMODipine (NYMALIZE) 6 MG/ML oral solution 60 mg, 60 mg, Per Tube, Q4H, Kary Kos, MD, 60 mg at 01/19/20 0447 .  norepinephrine (LEVOPHED) 4mg  in 2103mL premix infusion, 0-40 mcg/min, Intravenous, Titrated, Ogan, Okoronkwo U, MD, Last Rate: 18.75 mL/hr at 01/19/20 0600, 5 mcg/min at 01/19/20 0600 .  ondansetron (ZOFRAN) injection 4 mg, 4  mg, Intravenous, Q6H PRN, Donalee Citrin, MD, 4 mg at 01/17/20 2310 .  oxyCODONE-acetaminophen (PERCOCET/ROXICET) 5-325 MG per tablet 1 tablet, 1 tablet, Oral, Q4H PRN, Lynnell Catalan, MD, 1 tablet at 01/18/20 2123 .  phenylephrine CONCENTRATED 100mg  in sodium chloride 0.9% (0.4mg /mL) infusion, 0-400 mcg/min, Intravenous, Titrated, , MD, Last Rate: 60 mL/hr at 01/19/20 0600, 400 mcg/min at 01/19/20 0600 .  rosuvastatin (CRESTOR) tablet 40 mg, 40 mg, Oral, Daily, 03/20/20, MD, 40 mg at 01/17/20 0956 .  senna-docusate (Senokot-S) tablet 1 tablet, 1 tablet, Oral, BID, 01/19/20, MD, 1 tablet at 01/17/20 2138 .   simethicone (MYLICON) chewable tablet 160 mg, 160 mg, Oral, QID PRN, 2139 L, DO, 160 mg at 01/17/20 1002 .  sodium chloride flush (NS) 0.9 % injection 10-40 mL, 10-40 mL, Intracatheter, Q12H, 03-12-1997, MD, 10 mL at 01/19/20 0100 .  sodium chloride flush (NS) 0.9 % injection 10-40 mL, 10-40 mL, Intracatheter, PRN, 03-12-1997, MD   Physical Exam: Somnolent, Ox3, PERRL but keeps right eye shut and has difficulty opening it, gaze neutral but does have some nystagmus and can't do full EOMs with some R INO, face with some left sided facial weakness but appears UMN pattern with some forehead sparing, symmetric facial sensation, tongue midline, +L SCM weakness with difficulty turning head to the left LUE/LLE 4-/5 diffusely  Assessment & Plan: 69 y.o. man w/ angio neg SAH, clinical spasm of basilar and R ICA s/p angioplasty, on [place] Tx.   -L sided function recovered, has some residual deficit but good exam to follow. Difficult CN exam, but appears to have some residual R INO and SCM weakness, likely 2/2 vertebrobasilar spasm -continue SBP>200 -CCM recs  71  01/19/20 9:10 AM

## 2020-01-19 NOTE — Progress Notes (Signed)
Transcranial Doppler  Date POD PCO2 HCT BP  MCA ACA PCA OPHT SIPH VERT Basilar  4/27 MS     Right  Left   197  64   *  105   12  -17   27  23    150  29   -23  -31   -36      4/28,rs     Right  Left   134  131   -31  -92   38  -24   57  50   153  136   -18  -31   -42      5/1 GC     Right  Left   96  50   *  *   30  37   30  25   14   95   -60  -22   -63            Right  Left                                             Right  Left                                            Right  Left                                            Right  Left                                        MCA = Middle Cerebral Artery      OPHT = Opthalmic Artery     BASILAR = Basilar Artery   ACA = Anterior Cerebral Artery     SIPH = Carotid Siphon PCA = Posterior Cerebral Artery   VERT = Verterbral Artery                   Normal MCA = 62+\-12 ACA = 50+\-12 PCA = 42+\-23  *Unable to insonate 01/15/2020- Right Lind[place]ard ratio=7.3, left Lind[place]ard ratio=1.68 01/16/20 - Right Lindegaard ratio = 3.3, left Lindegaard ratio = 6.2  01/19/20 12:43 PM 01/18/20 RVT

## 2020-01-19 NOTE — Progress Notes (Signed)
This RN entered patient's room to find him with n/o AMS and increased paresis on left side, patient with slurred speech and increased droop on R side of face. Patient BP at this time 147/50, titrated up on vasopressors. Dr. Roda Shutters at bedside, stat MRI/MRA ordered.   [name], RN

## 2020-01-20 ENCOUNTER — Inpatient Hospital Stay (HOSPITAL_COMMUNITY): Payer: BC Managed Care – PPO

## 2020-01-20 DIAGNOSIS — J9601 Acute respiratory failure with hypoxia: Secondary | ICD-10-CM

## 2020-01-20 DIAGNOSIS — I609 Nontraumatic subarachnoid hemorrhage, unspecified: Secondary | ICD-10-CM | POA: Diagnosis not present

## 2020-01-20 DIAGNOSIS — E7800 Pure hypercholesterolemia, unspecified: Secondary | ICD-10-CM

## 2020-01-20 DIAGNOSIS — Z452 Encounter for adjustment and management of vascular access device: Secondary | ICD-10-CM

## 2020-01-20 LAB — BASIC METABOLIC PANEL
Anion gap: 11 (ref 5–15)
BUN: 12 mg/dL (ref 8–23)
CO2: 28 mmol/L (ref 22–32)
Calcium: 8.9 mg/dL (ref 8.9–10.3)
Chloride: 102 mmol/L (ref 98–111)
Creatinine, Ser: 0.81 mg/dL (ref 0.61–1.24)
GFR calc Af Amer: >60 mL/min (ref >60)
GFR calc non Af Amer: >60 mL/min (ref >60)
Glucose, Bld: 342 mg/dL — ABNORMAL HIGH (ref 70–99)
Potassium: 3.5 mmol/L (ref 3.5–5.1)
Sodium: 141 mmol/L (ref 135–145)

## 2020-01-20 LAB — CBC
HCT: 40.6 % (ref 39.0–52.0)
Hemoglobin: 13.2 g/dL (ref 13.0–17.0)
MCH: 29.3 pg (ref 26.0–34.0)
MCHC: 32.5 g/dL (ref 30.0–36.0)
MCV: 90.2 fL (ref 80.0–100.0)
Platelets: 218 10*3/uL (ref 150–400)
RBC: 4.5 MIL/uL (ref 4.22–5.81)
RDW: 13.2 % (ref 11.5–15.5)
WBC: 18.5 10*3/uL — ABNORMAL HIGH (ref 4.0–10.5)
nRBC: 0 % (ref 0.0–0.2)

## 2020-01-20 LAB — GLUCOSE, CAPILLARY
Glucose-Capillary: 354 mg/dL — ABNORMAL HIGH (ref 70–99)
Glucose-Capillary: 266 mg/dL — ABNORMAL HIGH (ref 70–99)
Glucose-Capillary: 228 mg/dL — ABNORMAL HIGH (ref 70–99)
Glucose-Capillary: 148 mg/dL — ABNORMAL HIGH (ref 70–99)
Glucose-Capillary: 194 mg/dL — ABNORMAL HIGH (ref 70–99)
Glucose-Capillary: 290 mg/dL — ABNORMAL HIGH (ref 70–99)

## 2020-01-20 LAB — MAGNESIUM: Magnesium: 1.9 mg/dL (ref 1.7–2.4)

## 2020-01-20 LAB — PHOSPHORUS: Phosphorus: 1.2 mg/dL — ABNORMAL LOW (ref 2.5–4.6)

## 2020-01-20 LAB — COOXEMETRY PANEL
Carboxyhemoglobin: 1.4 % (ref 0.5–1.5)
Methemoglobin: 0.8 % (ref 0.0–1.5)
O2 Saturation: 96.1 %
Total hemoglobin: 13.9 g/dL (ref 12.0–16.0)

## 2020-01-20 LAB — OSMOLALITY, URINE: Osmolality, Ur: 408 mOsm/kg (ref 300–900)

## 2020-01-20 IMAGING — DX DG CHEST 1V PORT
1 series · 1 of 1 positions shown · non-contrast
Comparison: [DATE]

CLINICAL DATA: Endotracheal tube placement.

EXAM:
PORTABLE CHEST 1 VIEW

[chest ap]
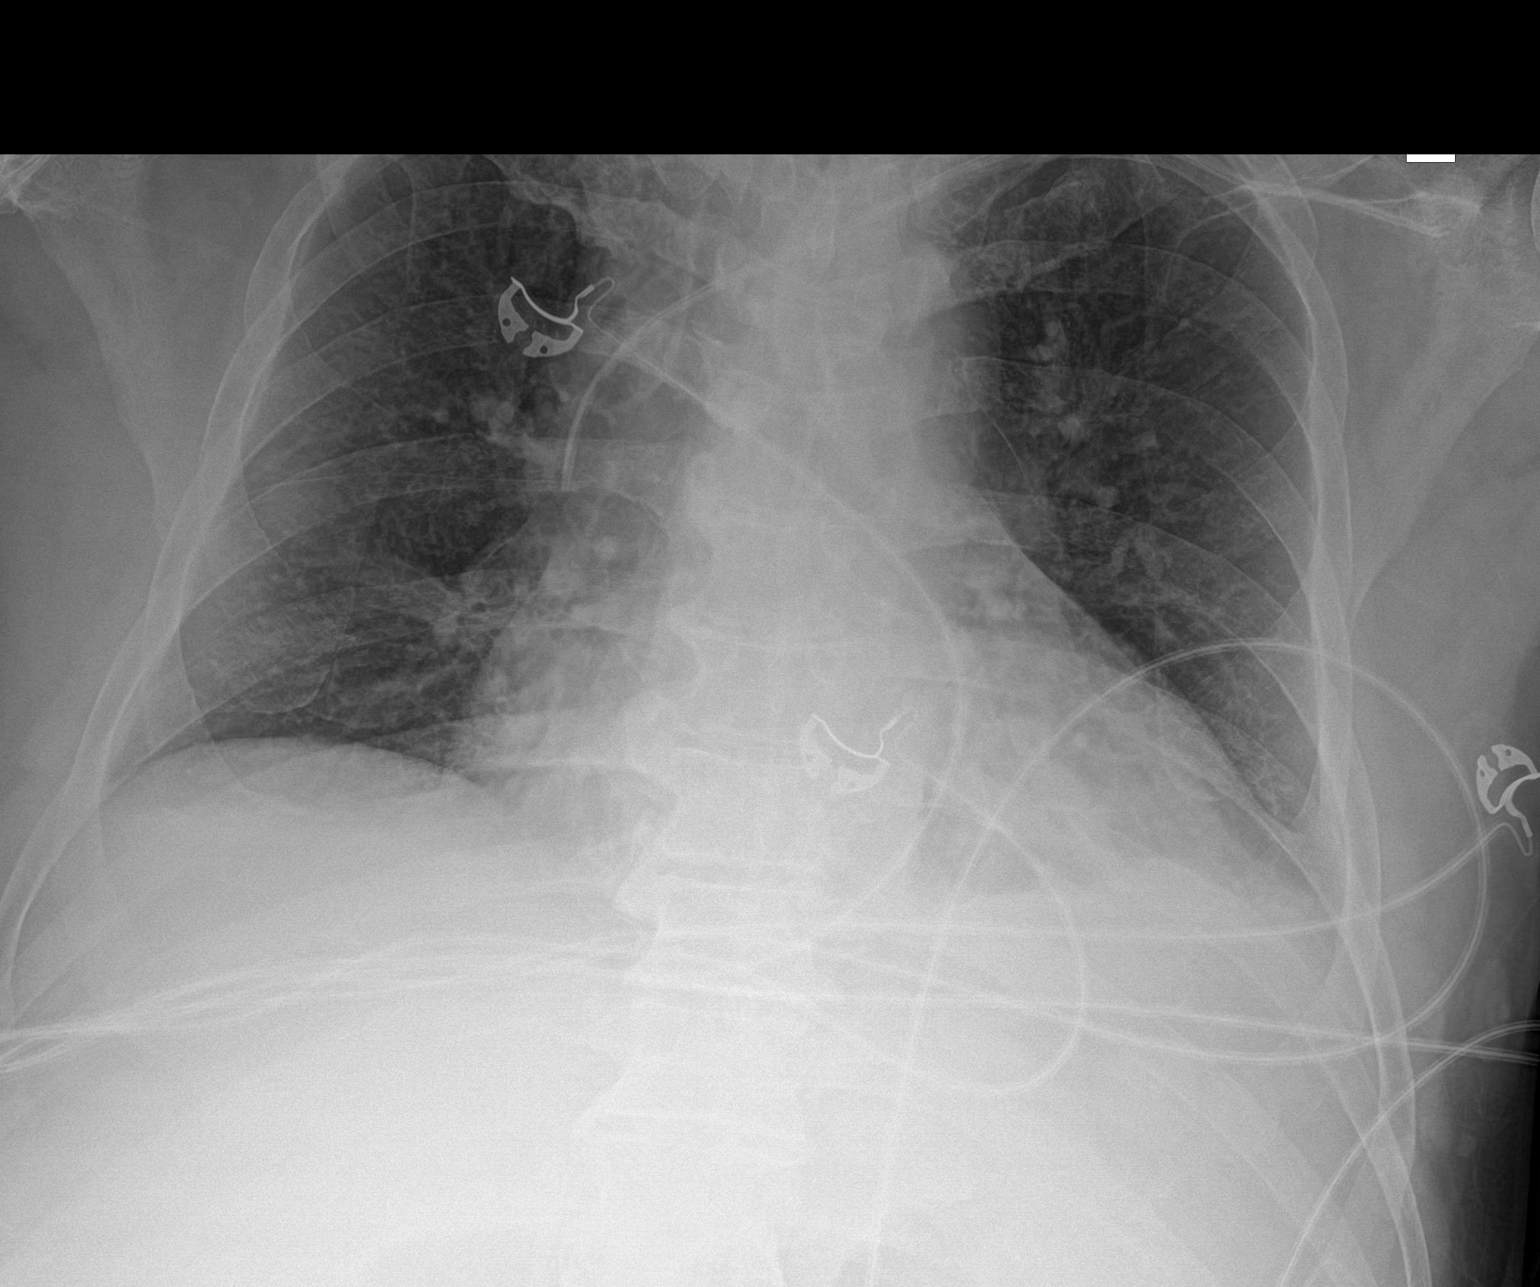

[1 of 1 positions shown; findings below may reference images not displayed]

FINDINGS: Left subclavian central venous catheter unchanged. No evidence of
endotracheal tube. Lungs are somewhat hypoinflated with minimal left
basilar opacification likely atelectasis. Mild stable cardiomegaly.
Remainder the exam is unchanged.
IMPRESSION: 1. Minimal left base opacification likely atelectasis. Mild stable
cardiomegaly.

2. Left subclavian central venous catheter unchanged. No
visualization of endotracheal tube.

## 2020-01-20 MED ORDER — MAGNESIUM SULFATE 2 GM/50ML IV SOLN
2.0000 g | Freq: Once | INTRAVENOUS | Status: AC
  Administered 2020-01-20: 09:00:00 2 g via INTRAVENOUS
  Filled 2020-01-20: qty 50

## 2020-01-20 MED ORDER — LEVETIRACETAM 500 MG PO TABS
500.0000 mg | ORAL_TABLET | Freq: Two times a day (BID) | ORAL | Status: DC
  Administered 2020-01-20 – 2020-01-21 (×3): 500 mg via ORAL
  Filled 2020-01-20 (×3): qty 1

## 2020-01-20 MED ORDER — INSULIN ASPART 100 UNIT/ML ~~LOC~~ SOLN
0.0000 [IU] | SUBCUTANEOUS | Status: DC
  Administered 2020-01-20: 12 [IU] via SUBCUTANEOUS
  Administered 2020-01-20: 8 [IU] via SUBCUTANEOUS
  Administered 2020-01-20: 4 [IU] via SUBCUTANEOUS
  Administered 2020-01-20: 2 [IU] via SUBCUTANEOUS
  Administered 2020-01-20: 20 [IU] via SUBCUTANEOUS
  Administered 2020-01-20: 12 [IU] via SUBCUTANEOUS
  Administered 2020-01-21: 8 [IU] via SUBCUTANEOUS
  Administered 2020-01-21: 4 [IU] via SUBCUTANEOUS
  Administered 2020-01-21: 12 [IU] via SUBCUTANEOUS
  Administered 2020-01-21: 4 [IU] via SUBCUTANEOUS
  Administered 2020-01-21: 8 [IU] via SUBCUTANEOUS
  Administered 2020-01-22 (×2): 2 [IU] via SUBCUTANEOUS
  Administered 2020-01-22 (×2): 8 [IU] via SUBCUTANEOUS
  Administered 2020-01-22 – 2020-01-23 (×2): 2 [IU] via SUBCUTANEOUS
  Administered 2020-01-23: 4 [IU] via SUBCUTANEOUS
  Administered 2020-01-23: 8 [IU] via SUBCUTANEOUS
  Administered 2020-01-23: 4 [IU] via SUBCUTANEOUS
  Administered 2020-01-23 – 2020-01-24 (×2): 8 [IU] via SUBCUTANEOUS
  Administered 2020-01-24: 4 [IU] via SUBCUTANEOUS
  Administered 2020-01-24 (×2): 8 [IU] via SUBCUTANEOUS
  Administered 2020-01-24 – 2020-01-25 (×4): 2 [IU] via SUBCUTANEOUS
  Administered 2020-01-25: 20:00:00 4 [IU] via SUBCUTANEOUS
  Administered 2020-01-25: 8 [IU] via SUBCUTANEOUS
  Administered 2020-01-25 (×2): 2 [IU] via SUBCUTANEOUS
  Administered 2020-01-26 (×3): 4 [IU] via SUBCUTANEOUS
  Administered 2020-01-26: 2 [IU] via SUBCUTANEOUS
  Administered 2020-01-26 – 2020-01-27 (×3): 4 [IU] via SUBCUTANEOUS
  Administered 2020-01-27 – 2020-01-28 (×5): 2 [IU] via SUBCUTANEOUS
  Administered 2020-01-28 – 2020-01-29 (×2): 4 [IU] via SUBCUTANEOUS
  Administered 2020-01-29: 16 [IU] via SUBCUTANEOUS
  Administered 2020-01-30: 2 [IU] via SUBCUTANEOUS

## 2020-01-20 MED ORDER — SODIUM PHOSPHATES 45 MMOLE/15ML IV SOLN
30.0000 mmol | Freq: Once | INTRAVENOUS | Status: AC
  Administered 2020-01-20: 30 mmol via INTRAVENOUS
  Filled 2020-01-20: qty 10

## 2020-01-20 MED ORDER — BISACODYL 5 MG PO TBEC
10.0000 mg | DELAYED_RELEASE_TABLET | Freq: Every day | ORAL | Status: DC
  Administered 2020-01-20 – 2020-01-29 (×5): 10 mg via ORAL
  Filled 2020-01-20 (×6): qty 2

## 2020-01-20 MED ORDER — MAGNESIUM HYDROXIDE 400 MG/5ML PO SUSP
30.0000 mL | Freq: Every day | ORAL | Status: DC
  Administered 2020-01-20 – 2020-01-30 (×8): 30 mL via ORAL
  Filled 2020-01-20 (×9): qty 30

## 2020-01-20 MED ORDER — INSULIN DETEMIR 100 UNIT/ML ~~LOC~~ SOLN
30.0000 [IU] | Freq: Two times a day (BID) | SUBCUTANEOUS | Status: DC
  Administered 2020-01-20 – 2020-01-21 (×3): 30 [IU] via SUBCUTANEOUS
  Filled 2020-01-20 (×4): qty 0.3

## 2020-01-20 MED ORDER — POTASSIUM CHLORIDE 10 MEQ/50ML IV SOLN
10.0000 meq | INTRAVENOUS | Status: AC
  Administered 2020-01-20 (×2): 10 meq via INTRAVENOUS
  Filled 2020-01-20 (×2): qty 50

## 2020-01-20 MED ORDER — VASOPRESSIN 20 UNIT/ML IV SOLN
0.0400 [IU]/min | INTRAVENOUS | Status: DC
  Administered 2020-01-20 – 2020-01-28 (×12): 0.04 [IU]/min via INTRAVENOUS
  Filled 2020-01-20 (×16): qty 2

## 2020-01-20 MED ORDER — POTASSIUM CHLORIDE 10 MEQ/50ML IV SOLN
10.0000 meq | INTRAVENOUS | Status: DC
Start: 2020-01-20 — End: 2020-01-20

## 2020-01-20 MED ORDER — BISACODYL 10 MG RE SUPP: 10.0000 mg | Freq: Every day | RECTAL | Status: DC | PRN

## 2020-01-20 NOTE — Progress Notes (Signed)
Neurosurgery Service Progress Note  Subjective: No repeat episodes of clinical spasm, having some arrhythmia as pressor requirements increase  Objective: Vitals:   01/20/20 0745 01/20/20 0800 01/20/20 0815 01/20/20 0830  BP: (!) 173/62 (!) 169/74 (!) 170/59 (!) 155/62  Pulse:  82 96 (!) 103  Resp: (!) 22 (!) 22 (!) 21 (!) 23  Temp:  (!) 100.7 F (38.2 C)    TempSrc:  Axillary    SpO2:  97% (!) 82% (!) 88%  Weight:      Height:       Temp (24hrs), Avg:100 F (37.8 C), Min:97.9 F (36.6 C), Max:101.2 F (38.4 C)  CBC Latest Ref Rng & Units 01/20/2020 01/19/2020 01/17/2020  WBC 4.0 - 10.5 K/uL 18.5(H) 14.8(H) 17.2(H)  Hemoglobin 13.0 - 17.0 g/dL 16.1 09.6 04.5  Hematocrit 39.0 - 52.0 % 40.6 40.2 44.1  Platelets 150 - 400 K/uL 218 224 196   BMP Latest Ref Rng & Units 01/20/2020 01/19/2020 01/19/2020  Glucose 70 - 99 mg/dL 409(W) 119(J) 478(G)  BUN 8 - 23 mg/dL 12 12 14   Creatinine 0.61 - 1.24 mg/dL 9.56 2.13  Sodium 135 - 145 mmol/L 141 145 143  Potassium 3.5 - 5.1 mmol/L 3.5 3.2(L) 3.6  Chloride 98 - 111 mmol/L 102 104 106  CO2 22 - 32 mmol/L 28 29 27   Calcium 8.9 - 10.3 mg/dL 8.9 0.86) )    Intake/Output Summary (Last 24 hours) at 01/20/2020 0840 Last data filed at 01/20/2020 0800 Gross per 24 hour  Intake 8528.3 ml  Output 03/21/2020 ml  Net -4121.7 ml    Current Facility-Administered Medications:  .  0.9 %  sodium chloride infusion, , Intravenous, PRN, 03/21/2020, MD, Last Rate: 0 mL/hr at 01/14/20 1126, New Bag at 01/18/20 1157 .  0.9 %  sodium chloride infusion, , Intravenous, Continuous, 01/16/20, MD, Last Rate: 125 mL/hr at 01/20/20 0800, Rate Verify at 01/20/20 0800 .  acetaminophen (TYLENOL) tablet 650 mg, 650 mg, Oral, Q4H PRN, 650 mg at 01/19/20 1043 **OR** acetaminophen (TYLENOL) 160 MG/5ML solution 650 mg, 650 mg, Per Tube, Q4H PRN, 650 mg at 01/20/20 0501 **OR** acetaminophen (TYLENOL) suppository 650 mg, 650 mg, Rectal, Q4H PRN, 03/20/20, MD .   bisacodyl (DULCOLAX) EC tablet 10 mg, 10 mg, Oral, Q0600, 03/21/20 F, NP .  bisacodyl (DULCOLAX) suppository 10 mg, 10 mg, Rectal, Daily PRN, Donalee Citrin F, NP .  calcium carbonate (OS-CAL - dosed in mg of elemental calcium) tablet 500 mg of elemental calcium, 1 tablet, Oral, BID WC, Raymon Mutton F, NP, 500 mg of elemental calcium at 01/19/20 1738 .  Chlorhexidine Gluconate Cloth 2 % PADS 6 each, 6 each, Topical, Daily, Raymon Mutton, MD, 6 each at 01/19/20 1400 .  cholecalciferol (VITAMIN D3) tablet 1,000 Units, 1,000 Units, Oral, Daily, Donalee Citrin, MD, 1,000 Units at 01/19/20 1042 .  fenofibrate tablet 160 mg, 160 mg, Oral, Daily, Donalee Citrin, MD, 160 mg at 01/19/20 1023 .  HYDROmorphone (DILAUDID) injection 0.5 mg, 0.5 mg, Intravenous, Q2H PRN, Agarwala, Ravi, MD, 0.5 mg at 01/20/20 0739 .  insulin aspart (novoLOG) injection 0-24 Units, 0-24 Units, Subcutaneous, Q4H, 03/21/20, MD, 12 Units at 01/20/20 [phone] .  insulin aspart (novoLOG) injection 10 Units, 10 Units, Subcutaneous, TID WC, 03/21/20, MD, 10 Units at 01/20/20 0738 .  insulin detemir (LEVEMIR) injection 30 Units, 30 Units, Subcutaneous, BID, Lynnell Catalan, Whitney F, NP .  levETIRAcetam (KEPPRA) IVPB 500 mg/100 mL premix, 500 mg,  Intravenous, Q12H, Consuella Lose, MD, Stopped at 01/20/20 0501 .  magnesium hydroxide (MILK OF MAGNESIA) suspension 30 mL, 30 mL, Oral, Daily, Candee Furbish, MD .  magnesium sulfate IVPB 2 g 50 mL, 2 g, Intravenous, Once, Merlene Laughter F, NP .  niMODipine (NIMOTOP) capsule 30 mg, 30 mg, Oral, Q2H, 30 mg at 01/19/20 1544 **OR** niMODipine (NYMALIZE) 6 MG/ML oral solution 30 mg, 30 mg, Per Tube, Q2H, Consuella Lose, MD, 30 mg at 01/20/20 0622 .  norepinephrine (LEVOPHED) 4mg  in 211mL premix infusion, 0-40 mcg/min, Intravenous, Titrated, Ogan, Okoronkwo U, MD, Last Rate: 93.8 mL/hr at 01/20/20 0800, 25 mcg/min at 01/20/20 0800 .  ondansetron (ZOFRAN) injection 4 mg, 4 mg, Intravenous, Q6H  PRN, Kary Kos, MD, 4 mg at 01/17/20 2310 .  oxyCODONE-acetaminophen (PERCOCET/ROXICET) 5-325 MG per tablet 1 tablet, 1 tablet, Oral, Q4H PRN, Kipp Brood, MD, 1 tablet at 01/19/20 1853 .  phenylephrine CONCENTRATED 100mg  in sodium chloride 0.9% 245mL (0.4mg /mL) infusion, 0-400 mcg/min, Intravenous, Titrated, Consuella Lose, MD, Last Rate: 60 mL/hr at 01/20/20 0800, 400 mcg/min at 01/20/20 0800 .  rosuvastatin (CRESTOR) tablet 40 mg, 40 mg, Oral, Daily, Kary Kos, MD, 40 mg at 01/19/20 1042 .  senna-docusate (Senokot-S) tablet 1 tablet, 1 tablet, Oral, BID, Kary Kos, MD, 1 tablet at 01/19/20 2300 .  simethicone (MYLICON) chewable tablet 160 mg, 160 mg, Oral, QID PRN, Lane Hacker L, DO, 160 mg at 01/17/20 1002 .  sodium chloride flush (NS) 0.9 % injection 10-40 mL, 10-40 mL, Intracatheter, Q12H, Greta Doom, MD, 10 mL at 01/19/20 2117 .  sodium chloride flush (NS) 0.9 % injection 10-40 mL, 10-40 mL, Intracatheter, PRN, Greta Doom, MD .  sodium phosphate 30 mmol in dextrose 5 % 250 mL infusion, 30 mmol, Intravenous, Once, Ogan, Kerry Kass, MD, Last Rate: 43 mL/hr at 01/20/20 0800, Rate Verify at 01/20/20 0800   Physical Exam: Mildly somnolent, Ox3, PERRL but keeps right eye shut and has difficulty opening it, EOM today but difficult exam due to frequent purposeful closure of R eye, looks like he gets diplopia and shuts that eye to get rid of it, face with some left sided facial weakness but appears UMN pattern with some forehead sparing, symmetric facial sensation, tongue midline, +L SCM weakness with difficulty turning head to the left LUE/LLE 4-/5 diffusely  Assessment & Plan: 69 y.o. man w/ angio neg SAH, clinical spasm of basilar and R ICA s/p angioplasty, on [place] Tx.   -continue SBP 180-220, will get TCDs tomorrow, exam stable -CCM recs  Judith Part  01/20/20 8:40 AM

## 2020-01-20 NOTE — Evaluation (Signed)
Clinical/Bedside Swallow Evaluation Patient Details  Name: Nicholas [name] MRN: [id number] Date of Birth: 1951-07-15  Today's Date: 01/20/2020 Time: SLP Start Time (ACUTE ONLY): 58 SLP Stop Time (ACUTE ONLY): 1035 SLP Time Calculation (min) (ACUTE ONLY): 15 min  Past Medical History:  Past Medical History:  Diagnosis Date  . BPH (benign prostatic hypertrophy) 12/20/2011   Dr. Thomasene Mohair  . Colon polyp    Dr. Valli Glance, it was recommended that he have a follow-up colonoscopy in 2010.  Marland Kitchen Deviated septum   . Diabetes mellitus without complication [facility])    Eye exam - 2011  . Hepatitis A   . HSV-1 (herpes simplex virus 1) infection   . Hyperlipidemia   . Hypertension   . Migraine headache    Past Surgical History:  Past Surgical History:  Procedure Laterality Date  . IR ANGIO EXTERNAL CAROTID SEL EXT CAROTID BILAT MOD SED  01/15/2020  . IR ANGIO INTRA EXTRACRAN SEL INTERNAL CAROTID BILAT MOD SED  01/15/2020  . IR ANGIO VERTEBRAL SEL VERTEBRAL BILAT MOD SED  01/15/2020  . IR ENDOVASC INTRACRANIAL INF OTHER THAN THROMBO ART INC DIAG ANGIO  01/18/2020  . IR ENDOVASC INTRACRANIAL INF OTHER THAN THROMBO ART INC DIAG ANGIO EA ADD  01/18/2020  . IR PTA VASOSPASM ADD DIFF  01/18/2020  . IR PTA VASOSPASM INITIAL  01/18/2020  . NASAL SEPTUM SURGERY    . RADIOLOGY WITH ANESTHESIA N/A 01/15/2020   Procedure: IR WITH ANESTHESIA;  Surgeon: Consuella Lose, MD;  Location: [facility];  Service: Radiology;  Laterality: N/A;   HPI:  Pt is a 69 year old male presented to the ED today for persistent headaches that have gradually worsened since yesterday evening. CT head 4/26: Large volume subarachnoid hemorrhage, right greater than left. Possible right tentorial subdural hemorrhage versus subarachnoid hemorrhage. Nicholas [name] on 4/27. 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   Assessment / Plan / Recommendation Clinical Impression  Pt was seen for bedside swallow evaluation and she denied a history of dysphagia. Pt's son reported that the pt has been asymptomatic of aspiration since admission but RN stated that the pt's status waxes and wanes with his pressure. Oral mechanism exam was significant for left-sided facial and lingual weakness. Pt exhibited coughing with the first set of consecutive swallows of thin liquids when his positioning was sub-optimal but exhibited no further signs of aspiration with any solids/liquids. Mastication time was mildly increased but functional. Considering pt's reported fluctuating status and instance of coughing noted with consecutive swallows of thin liquids, SLP will see pt for one additional visit to ensure diet tolerance. SLP Visit Diagnosis: Dysphagia, unspecified (R13.10)    Aspiration Risk  Mild aspiration risk    Diet Recommendation Regular;Thin liquid   Liquid Administration via: Cup;Straw Medication Administration: Whole meds with liquid Supervision: Staff to assist with self feeding Compensations: Slow rate Postural Changes: Seated upright at 90 degrees    Other  Recommendations Oral Care Recommendations: Oral care BID   Follow up Recommendations None      Frequency and Duration min 2x/week  1 week       Prognosis        Swallow Study   General Date of Onset: 01/19/20 HPI: Pt is a 69 year old male presented to the ED today for persistent headaches that have gradually worsened since yesterday evening. CT head 4/26:  Large volume subarachnoid hemorrhage, right greater than left. Possible right tentorial subdural hemorrhage versus subarachnoid hemorrhage. [name] on 4/27. 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 Type of Study: Bedside  Swallow Evaluation Previous Swallow Assessment: None Diet Prior to this Study: Regular;Thin liquids Temperature Spikes Noted: No Respiratory Status: Nasal cannula History of Recent Intubation: Yes Length of Intubations (days): 1 days(for procedure) Date extubated: 01/15/20 Behavior/Cognition: Alert;Cooperative;Pleasant mood Oral Cavity Assessment: Within Functional Limits Oral Care Completed by SLP: No Oral Cavity - Dentition: Adequate natural dentition Vision: Functional for self-feeding Self-Feeding Abilities: Able to feed self Patient Positioning: Upright in bed;Postural control adequate for testing Baseline Vocal Quality: Normal Volitional Cough: Strong Volitional Swallow: Able to elicit    Oral/Motor/Sensory Function Overall Oral Motor/Sensory Function: Mild impairment Facial ROM: Reduced left;Suspected CN VII (facial) dysfunction Facial Symmetry: Within Functional Limits Facial Strength: Reduced left;Suspected CN VII (facial) dysfunction Facial Sensation: Within Functional Limits Lingual ROM: Reduced left;Suspected CN XII (hypoglossal) dysfunction Lingual Symmetry: Within Functional Limits Lingual Strength: Reduced;Suspected CN XII (hypoglossal) dysfunction Lingual Sensation: Within Functional Limits Velum: Within Functional Limits Mandible: Within Functional Limits   Ice Chips Ice chips: Within functional limits Presentation: Spoon   Thin Liquid Thin Liquid: Impaired Presentation: Straw;Cup Pharyngeal  Phase Impairments: Cough - Immediate(With initial set of consecutive swallows when pt was recline)    Nectar Thick Nectar Thick Liquid: Not tested   Honey Thick Honey Thick Liquid: Not tested   Puree Puree: Within functional limits Presentation: Spoon   Solid     Solid: Within functional limits Presentation: Self Fed     Nicholas [name] I. Nicholas Clock, MS, CCC-SLP Acute Rehabilitation Services Office number [phone] Pager [phone]  [name] 01/20/2020,1:01 PM

## 2020-01-20 NOTE — Progress Notes (Signed)
eLink Physician-Brief Progress Note Patient Name: Nicholas [name] DOB: 02/01/1951 MRN: [id number]   Date of Service  01/20/2020  HPI/Events of Note  Pt with frequent ectopy which is likely secondary to increased adrenergic tone associated with SAH and vasospasm, electrolytes have been sent to exclude electrolyte abnormalities as contributing factor.  eICU Interventions  Follow up electrolytes, monitor for changes in neurological status.        Thomasene Lot Randi Poullard 01/20/2020, 2:04 AM

## 2020-01-20 NOTE — Progress Notes (Signed)
[facility] ADULT ICU REPLACEMENT PROTOCOL FOR AM LAB REPLACEMENT ONLY  The patient does apply for the [facility] Adult ICU Electrolyte Replacment Protocol based on the criteria listed below:   1. Is GFR >/= 40 ml/min? Yes.    Patient's GFR today is >60 2. Is urine output >/= 0.5 ml/kg/hr for the last 6 hours? Yes.   Patient's UOP is 10.03 ml/kg/hr 3. Is BUN < 60 mg/dL? Yes.    Patient's BUN today is 12 4. Abnormal electrolyte(s): K+ 3.5 5. Ordered repletion with: protocol 6. If a panic level lab has been reported, has the CCM MD in charge been notified? Yes.  .   Physician:  Dr. Cecile Sheerer, Lilia Argue 01/20/2020 5:11 AM

## 2020-01-20 NOTE — Progress Notes (Signed)
NAME:  Nicholas [name], MRN:  [id number], DOB:  08-Aug-1951, LOS: 6 ADMISSION DATE:  01/14/2020, CONSULTATION DATE:  01/14/2020 REFERRING MD:  Lowella Petties , CHIEF COMPLAINT:  SAH   Brief History   69 year old man who presented with 24h of progressive nuchal rigidity and was found to have a HH 2 Fisher 3 SAH  Both CTA and catheter angiography negative for aneurysm due to presence of vasospasm.   Has maintained normal examination   Past Medical History   Past Medical History:  Diagnosis Date  . BPH (benign prostatic hypertrophy) 12/20/2011   Dr. Cleatrice Burke  . Colon polyp    Dr. Vernell Barrier, it was recommended that he have a follow-up colonoscopy in 2010.  Marland Kitchen Deviated septum   . Diabetes mellitus without complication [facility])    Eye exam - 2011  . Hepatitis A   . HSV-1 (herpes simplex virus 1) infection   . Hyperlipidemia   . Hypertension   . Migraine headache     Significant Hospital Events   Admitted 4/26  Consults:  PCCM  Procedures:  CVL 4/30 A-line 4/30 Diagnostic cerebral angiogram with balloon angioplasty 4/30  Significant Diagnostic Tests:  CTA head and neck 4/26 > 1. Large volume subarachnoid hemorrhage, right greater than left. Possible right tentorial subdural hemorrhage versus subarachnoid hemorrhage. 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 Recommend catheter angiogram for further evaluation. 2. Negative for hydrocephalus.  No acute infarct. 3. Small caliber basilar artery could represent basal spasm. 4. 25% diameter stenosis proximal right internal carotid artery. 50% diameter stenosis proximal left internal carotid artery. Mild to moderate stenosis in the cavernous carotid bilaterally due to atherosclerotic calcification 5. These results were called by telephone at the time of interpretation on 01/14/2020 at 11:47 am to provider ABIGAIL HARRIS , who verbally acknowledged these results.  CTA head 4/30 > Overall  decreased volume of subarachnoid hemorrhage since 01/15/2020. New left subdural hygroma and subcentimeter rightward midline shift. Resolution of hydrocephalus.  Further significantly diminished flow within the right ICA with no enhancement visualized at and beyond the cavernous segment. Mild increased right M1 MCA narrowing and diminished distal right MCA branch filling. New narrowing of the left A1 ACA with severe stenosis near the origin.  Persistent irregularity of the distal basilar artery with new superimposed severe stenosis.  MRI brain 5/1 > Multiple small acute infarcts the right cerebral hemisphere primarily involving the anterior greater than posterior circulations.  Thin left larger than right subdural collections with stable mild rightward midline shift.  Residual subarachnoid hemorrhage likely similar to prior CT.  Suboptimal vascular evaluation due to intrinsic T1 hyperintensity of hemorrhage. Improved flow within the intracranial right ICA compared to 01/18/2020 CTA. Appearance is now similar to 01/14/2020 CTA with persistent severe stenosis of the supraclinoid portion. Likely persistent left A1 ACA stenosis and distal basilar stenosis.  Micro Data:  COVID 4/26 > negative MRSA PCR 4/26 > negative  Urine culture 4/26 > negative  Blood culture 4/26 > negative   Antimicrobials:    Interim history/subjective:  Having some ectopy overnight. A number of electrolyte abnormalities are being corrected. He is increasing in left sided strength. No BM since admission.  Objective   Blood pressure (!) 193/160, pulse 93, temperature 99.2 F (37.3 C), temperature source Axillary, resp. rate (!) 25, height 6' (1.829 m), weight 87.8 kg, SpO2 95 %. CVP:  [5 mmHg-15 mmHg] 9 mmHg      Intake/Output Summary (Last  24 hours) at 01/20/2020 0717 Last data filed at 01/20/2020 0630 Gross per 24 hour  Intake 8691.28 ml  Output 12650 ml  Net -3958.72 ml   Filed Weights    01/15/20 1237 01/19/20 0459 01/20/20 0446  Weight: 90.7 kg 86.4 kg 87.8 kg    Examination: GEN: no acute distress HEENT: MMM, trachea midline CV: RRR, ext warm PULM: clear, no accessory muscle use GI: Protuberant, hypoactive BS EXT: No edema NEURO: Moving left upper/lower against gravity, right is purposeful and following commands, right gaze preference PSYCH: RASS 0 SKIN: No rashes  Foley with yellow urine Sugars up K slightly low Phos low Mg Low All being repleted WBC up  Resolved Hospital Problem list     Assessment & Plan:   Critically ill due to acute SAH complicated by vasospasm.  -S/P diagnostic cerebral angiogram with balloon angioplasty with good improvement Multiple small acute infarcts the right cerebral hemisphere- from vasospasm  P: Management per neurology  Maintain neuro protective measures; goal for eurothermia, euglycemia, eunatermia, normoxia, Nutrition and bowel regiment  Seizure precautions  AEDs per neurology  Continue hyervolemia, hemodilution, and hypertension to ensure adequate cerebral perfusion  Continue pressor support for targeted hypertension, SBP goal > 200 Frequent neuro checks   Hypoxia likely due to atelectasis P: Continue supplemental oxygen for SPO2 goal > 92 Encourage pulmonary hygiene  Follow intermittent CXR   Type 2 diabetes with poor control at baseline -Long acting insulin increased 5/1 but hyperglycemia persist  P: Continue SSI Increase Levimir again  CBG check q4  Intermittent fevers No acute signs of infection at present. Likely secondary to neurological process including acute bleed  P: Trend CBC and fever curve  Aim for normothermia  Follow closely for any indication on antibiotics, Pct could be helpful if needed  Significant urine output with concern for development of DI - SG not c/w DI to date P: Trend urine specific gravity 1.015 > 1.008 Trend urine osmolarity 377 > (pending 5/2) Closely monitor  urine output and volume status, maintain euvolemia  Ectopy Hypokalemia  Hypophosphatemia Hypomagnesemia P: Supplement  Monitor tele after electrolytes repleted, can do low dose amio PRN  Best practice:  Diet: diabetic Pain/Anxiety/Delirium protocol (if indicated): PRNs VAP protocol (if indicated): Pulmonary hygiene  DVT prophylaxis: SCD GI prophylaxis:PPI Glucose control: see above Mobility: Bedrest Code Status: Full Family Communication: Per primary  Disposition: Neuro ICU    The patient is critically ill with multiple organ systems failure and requires high complexity decision making for assessment and support, frequent evaluation and titration of therapies, application of advanced monitoring technologies and extensive interpretation of multiple databases. Critical Care Time devoted to patient care services described in this note independent of APP/resident time (if applicable)  is 32 minutes.   Erskine Emery MD [facility] 01/20/2020 8:27 AM Personal pager: [phone] If unanswered, please page CCM On-call: [phone]

## 2020-01-20 NOTE — Progress Notes (Signed)
eLink Physician-Brief Progress Note Patient Name: Nicholas [name] DOB: April 25, 1951 MRN: [id number]   Date of Service  01/20/2020  HPI/Events of Note  K+ 3.5,  Phos 1.2  eICU Interventions  KCL 10 meq via central line Q 1 hour x 2, Phosphorus 30 mmol iv bolus x 1        [name] U Fabiha Rougeau 01/20/2020, 5:16 AM

## 2020-01-20 NOTE — Progress Notes (Signed)
eLink Physician-Brief Progress Note Patient Name: Nicholas [name] DOB: 1950-10-05 MRN: [id number]   Date of Service  01/20/2020  HPI/Events of Note  Sub-optimal glycemic control.  eICU Interventions  CBG monitoring changed to Q 4 hours with SSI coverage.        Thomasene Lot [name] 01/20/2020, 3:57 AM

## 2020-01-20 NOTE — Progress Notes (Signed)
STROKE TEAM PROGRESS NOTE   INTERVAL HISTORY Wife and son and RN are at the bedside. Pt lying in bed, sleepy but easily arousable and following commands. Orientated. Left UE and LE strength improved from yesterday, more so on the LUE than LLE. Pt BP able to maintain at 170s, but needed now 3 pressors. Completed albumin and still on IVF. His glucose level still high and insulin dose adjusted by CCM. MRI yesterday showed scattered small infarcts at right MCA. Vasospasm improved from 4/30 but still has b/l ICA siphon high grade stenosis.     OBJECTIVE Vitals:   01/20/20 0615 01/20/20 0630 01/20/20 0700 01/20/20 0715  BP: (!) 183/85 (!) 193/160 (!) 184/72 (!) 177/81  Pulse: 84 93 81 88  Resp: (!) 23 (!) 25 (!) 24 (!) 26  Temp: 99.2 F (37.3 C)     TempSrc: Axillary     SpO2: 94% 95% 93% 93%  Weight:      Height:        CBC:  Recent Labs  Lab 01/14/20 1021 01/14/20 1022 01/19/20 0456 01/20/20 0110  WBC 14.6*   < > 14.8* 18.5*  NEUTROABS 13.7*  --   --   --   HGB 15.9   < > 13.0 13.2  HCT 46.7   < > 40.2 40.6  MCV 85.4   < > 90.5 90.2  PLT 232   < > 224 218   < > = values in this interval not displayed.    Basic Metabolic Panel:  Recent Labs  Lab 01/17/20 0250 01/19/20 0456 01/19/20 1420 01/20/20 0110 01/20/20 0201  NA 141   < > 145 141  --   K 4.0   < > 3.2* 3.5  --   CL 107   < > 104 102  --   CO2 25   < > 29 28  --   GLUCOSE 182*   < > 228* 342*  --   BUN 23   < > 12 12  --   CREATININE 0.76   < > 0.87 0.81  --   CALCIUM 8.3*   < > 8.7* 8.9  --   MG 1.8  --   --   --  1.9  PHOS 2.8  --   --   --  1.2*   < > = values in this interval not displayed.    Lipid Panel:     Component Value Date/Time   CHOL 88 01/19/2020 0456   TRIG 124 01/19/2020 0456   HDL 26 (L) 01/19/2020 0456   CHOLHDL 3.4 01/19/2020 0456   VLDL 25 01/19/2020 0456   LDLCALC 37 01/19/2020 0456   HgbA1c:  Lab Results  Component Value Date   HGBA1C 7.7 (H) 01/14/2020   Urine Drug Screen:      Component Value Date/Time   LABOPIA NONE DETECTED 01/19/2020 1058   COCAINSCRNUR NONE DETECTED 01/19/2020 1058   LABBENZ NONE DETECTED 01/19/2020 1058   AMPHETMU NONE DETECTED 01/19/2020 1058   THCU POSITIVE (A) 01/19/2020 1058   LABBARB NONE DETECTED 01/19/2020 1058    Alcohol Level No results found for: ETH  IMAGING  CT ANGIO HEAD W OR WO CONTRAST 01/18/2020 IMPRESSION:   Overall decreased volume of subarachnoid hemorrhage since 01/15/2020.   New left subdural hygroma and subcentimeter rightward midline shift.   Resolution of hydrocephalus.   Further significantly diminished flow within the right ICA with no enhancement visualized at and beyond the cavernous segment.   Mild  increased right M1 MCA narrowing and diminished distal right MCA branch filling.   New narrowing of the left A1 ACA with severe stenosis near the origin.   Persistent irregularity of the distal basilar artery with new superimposed severe stenosis.   IR PTA Vasospasm Initial IR PTA Vasospasm Add Diff IR ENDOVASC INTRACRANIAL INF OTHER THAN THROMBO ART INC DIAG ANGIO IR ENDOVASC INTRACRANIAL INF OTHER THAN THROMBO ART INC DIAG ANGIO EA ADD 01/18/2020 IMPRESSION:   Severe vasospasm involving the supraclinoid segments of the internal carotid arteries bilaterally and the length of the basilar artery.   There is significant improvement in vessel caliber and distal territory perfusion after balloon angioplasty of the right internal carotid artery, right middle cerebral artery, and basilar artery.   MRI / MRA Head/ Brain 01/19/2020 IMPRESSION:  Multiple small acute infarcts the right cerebral hemisphere primarily involving the anterior greater than posterior circulations.  Thin left larger than right subdural collections with stable mild rightward midline shift.  Residual subarachnoid hemorrhage likely similar to prior CT.  Suboptimal vascular evaluation due to intrinsic T1 hyperintensity of  hemorrhage. Improved flow within the intracranial right ICA compared to 01/18/2020 CTA.   Appearance is now similar to 01/14/2020 CTA with persistent severe stenosis of the supraclinoid portion. Likely persistent left A1 ACA stenosis and distal basilar stenosis.  DG CHEST PORT 1 VIEW 01/18/2020 IMPRESSION:  1. Left subclavian approach central line placed, tip at the lower SVC level.  2. No pneumothorax or acute cardiopulmonary abnormality.  DG CHEST PORT 1 VIEW 01/18/2020 IMPRESSION:  1.  Cardiomegaly.  No pulmonary venous congestion.  2.  Low lung volumes.  Mild bibasilar infiltrates.   DG Chest Port 1 View 12/09/19 IMPRESSION: Cardiomegaly without acute abnormality of the lungs in AP portable projection.  Transthoracic Echocardiogram  01/15/2020 IMPRESSIONS  1. Normal LV systolic function; proximal septal thickening; grade 1  diastolic dysfunction; mildly dilated aortic root; scerotic aortic valve.  2. Left ventricular ejection fraction, by estimation, is 70 to 75%. The  left ventricle has hyperdynamic function. The left ventricle has no  regional wall motion abnormalities. There is mild left ventricular  hypertrophy of the basal segment. Left  ventricular diastolic parameters are consistent with Grade I diastolic  dysfunction (impaired relaxation).  3. Right ventricular systolic function is normal. The right ventricular  size is normal.  4. The mitral valve is normal in structure. No evidence of mitral valve  regurgitation. No evidence of mitral stenosis.  5. The aortic valve is tricuspid. Aortic valve regurgitation is not  visualized. Mild to moderate aortic valve sclerosis/calcification is  present, without any evidence of aortic stenosis.  6. Aortic dilatation noted. There is mild dilatation of the aortic root  measuring 38 mm.  7. The inferior vena cava is normal in size with greater than 50%  respiratory variability, suggesting right atrial pressure of 3 mmHg.    Transcranial Doppler 01/16/2020 Summary:  Elevated right middle cerebral and carotid siphon and left middle cerebral artery and carotid siphon mean flow velocities suggest mild vasospasm.  Slight improvement compared with prior TCD study 01/14/20   ECG - ST rate 105 BPM. (See cardiology reading for complete details)   PHYSICAL EXAM  Temp:  [97.9 F (36.6 C)-101.2 F (38.4 C)] 99.2 F (37.3 C) (05/02 0615) Pulse Rate:  [74-127] 88 (05/02 0715) Resp:  [16-31] 26 (05/02 0715) BP: (126-220)/(48-160) 177/81 (05/02 0715) SpO2:  [91 %-98 %] 93 % (05/02 0715) Weight:  [87.8 kg] 87.8 kg (05/02 0446)  General - Well nourished, well developed, drowsy but easily arousable.  Ophthalmologic - fundi not visualized due to noncooperation.  Cardiovascular - Regular rate and rhythm.  Neuro - drowsy but easily arousable. Needed repetitive stimulation to keep eye opening. Able to answer questions appropriately, orientated to place and people and age, no dysarthria. B/l CN VI imcomplete gaze. Mild right ptosis, but PERRL. Inconsistently blinking to visual threat bilaterally. Left facial droop, tongue protrusion to the left. Left UE 3/5 proximal and 2/5 distal finger grip, LLE 2/5 proximal and 3/5 distally. RUE and RLE at least 4/5 and following commands. B/l babinski positive. Sensation subjectively symmetrical, coordination intact right FTN but not cooperative on the left FTN and gait not tested.   ASSESSMENT/PLAN Mr. Nicholas [name] is a 69 y.o. male with history of diabetes, migraines, herpes simplex virus 1, hypertension, hyperlipidemia, hepatitis A, and benign prostatic hypertrophy transported [facility] after developing sudden onset of severe headache initially with mild left upper extremity drift but still able to carry on a conversation. Later he developed fixed right gaze deviation as well as increased left arm and left leg weakness but was still able to communicate and speak.  He  did not receive IV t-PA due to ICH.  SAH with vasospasm - extensive angio-negative SAH with b/l ICA, MCA, BA vasospasm s/p angioplasty  Resultant left hemiplegia  CT head 4/26 - Large volume subarachnoid hemorrhage, right greater than left  CTA H/N 4/26 - 25% diameter stenosis proximal right internal carotid artery. 50% diameter stenosis proximal left internal carotid artery. Mild to moderate stenosis in the cavernous carotid bilaterally due to atherosclerotic calcification  CTA head 4/30 - Overall decreased volume of subarachnoid hemorrhage since 01/15/2020. Diminished flow within the right ICA with no enhancement visualized at and beyond the cavernous segment. Mild increased right M1 MCA narrowing and diminished distal right MCA branch filling. New narrowing of the left A1 ACA with severe stenosis near the origin. Persistent irregularity of the distal basilar artery with new superimposed severe stenosis.  IR - significant improvement in vessel caliber and distal territory perfusion after balloon angioplasty of the right internal carotid artery, right middle cerebral artery, and basilar artery  MRI head - Multiple small acute infarcts the right cerebral hemisphere primarily involving the anterior greater than posterior circulations. Thin left larger than right subdural collections with stable mild rightward midline shift.   MRA head - Improved flow within the intracranial right ICA compared to 01/18/2020 CTA. Appearance is now similar to 01/14/2020 CTA with persistent severe stenosis of the supraclinoid portion. Likely persistent left A1 ACA stenosis and distal basilar stenosis.  TCD 4/28 - Elevated right middle cerebral and carotid siphon and left middle cerebral artery and carotid siphon mean flow velocities suggest mild vasospasm. Slight improvement compared with prior TCD study 01/14/20   Carotid Doppler - not ordered  TCD 5/1- pending  2D Echo - EF 70 - 75%. Grade I diastolic  dysfunction (impaired relaxation).   Sars Corona Virus 2 - negative  LDL - 37  HgbA1c - 7.7  UDS - positive for THCU  VTE prophylaxis - SCDs  aspirin 81 mg daily prior to admission, now on No antithrombotic  Ongoing aggressive stroke risk factor management  Therapy recommendations:  pending  Disposition:  Pending  Stroke - due to vasospasm  CTA head 4/30 - Overall decreased volume of subarachnoid hemorrhage since 01/15/2020. Diminished flow within the right ICA with no enhancement visualized at and beyond the cavernous  segment. Mild increased right M1 MCA narrowing and diminished distal right MCA branch filling. New narrowing of the left A1 ACA with severe stenosis near the origin. Persistent irregularity of the distal basilar artery with new superimposed severe stenosis.  IR - significant improvement in vessel caliber and distal territory perfusion after balloon angioplasty of the right internal carotid artery, right middle cerebral artery, and basilar artery  MRI head - Multiple small acute infarcts the right cerebral hemisphere primarily involving the anterior greater than posterior circulations. Thin left larger than right subdural collections with stable mild rightward midline shift.   MRA head - Improved flow within the intracranial right ICA compared to 01/18/2020 CTA. Appearance is now similar to 01/14/2020 CTA with persistent severe stenosis of the supraclinoid portion. Likely persistent left A1 ACA stenosis and distal basilar stenosis.  BP goal 180-220  On Neo, Levophed, vasopressin, IVF  completed albumin 4 doses  Change nimodipine 60mg  Q4h to 30mg  Q2h  BP stable at 170s  Hypertension Triple H therepy  Home BP meds: Lisinopril  Current BP meds: Nimodipine   SBP goal - 180 - 220   On Levophed and Neo and vasopressin  On IVF @125   Completed albumin  Hyperlipidemia  Home Lipid lowering medication: Lipitor 80 mg daily and Fenofibrate (Crestor 40 mg  daily) Lovaza  LDL - 37, goal < 70  Current lipid lowering medication: Crestor 40 mg daily and Fenofibrate ; (statin contraindicated with ICH)  Continue statin at discharge  Diabetes  Home diabetic meds: insulin, glucotrol, Actos, Janumet, Xigduo  Current diabetic meds: levemir 30 bid, novolog 10 u tid  HgbA1c 7.7, goal < 7.0  SSI  CBG monitoring  Fever   Tmax 101->100.7->101.2->100.4  Leukocytosis WBCs - 14.6->17.2->14.8->18.5  Urine culture 4/26 -> NG  CXR 5/1 - Cardiomegaly without acute abnormality of the lungs in AP portable projection.  UA - neg  blood culture pending  Other Stroke Risk Factors  Advanced age  ETOH use, advised to drink no more than 1 alcoholic beverage per day.  Migraines  Other Active Problems  Code status - Full code  Keppra seizure prophelaxis   Hospital day # 6  This patient is critically ill due to stroke, SAH with cerebral vasospasm, left hemiplegia and at significant risk of neurological worsening, death form cerebral edema, recurrent stroke, recurrent bleeding seizure, hydrocephalus. This patient's care requires constant monitoring of vital signs, hemodynamics, respiratory and cardiac monitoring, review of multiple databases, neurological assessment, discussion with family, other specialists and medical decision making of high complexity. I spent 45 minutes of neurocritical care time in the care of this patient. I had long discussion with wife and son at bedside, updated pt current condition, treatment plan and potential prognosis, and answered all the questions. They expressed understanding and appreciation.  , MD PhD Stroke Neurology 01/20/2020 6:56 PM  To contact Stroke Continuity provider, please refer to 5/26. After hours, contact General Neurology

## 2020-01-21 ENCOUNTER — Other Ambulatory Visit (HOSPITAL_COMMUNITY): Payer: Self-pay

## 2020-01-21 ENCOUNTER — Other Ambulatory Visit (HOSPITAL_COMMUNITY): Payer: BC Managed Care – PPO

## 2020-01-21 ENCOUNTER — Other Ambulatory Visit: Payer: Self-pay | Admitting: Interventional Radiology

## 2020-01-21 ENCOUNTER — Inpatient Hospital Stay (HOSPITAL_COMMUNITY): Payer: BC Managed Care – PPO

## 2020-01-21 ENCOUNTER — Encounter (HOSPITAL_COMMUNITY)
Admission: EM | Disposition: A | Discharge status: Rehabilitation Facility | Payer: Self-pay | Source: Home / Self Care | Attending: Neurosurgery

## 2020-01-21 DIAGNOSIS — I63511 Cerebral infarction due to unspecified occlusion or stenosis of right middle cerebral artery: Secondary | ICD-10-CM

## 2020-01-21 DIAGNOSIS — I609 Nontraumatic subarachnoid hemorrhage, unspecified: Secondary | ICD-10-CM

## 2020-01-21 LAB — CBC
HCT: 47.3 % (ref 39.0–52.0)
Hemoglobin: 15.5 g/dL (ref 13.0–17.0)
MCH: 28.8 pg (ref 26.0–34.0)
MCHC: 32.8 g/dL (ref 30.0–36.0)
MCV: 87.9 fL (ref 80.0–100.0)
Platelets: 214 10*3/uL (ref 150–400)
RBC: 5.38 MIL/uL (ref 4.22–5.81)
RDW: 13.4 % (ref 11.5–15.5)
WBC: 20.5 10*3/uL — ABNORMAL HIGH (ref 4.0–10.5)
nRBC: 0 % (ref 0.0–0.2)

## 2020-01-21 LAB — GLUCOSE, CAPILLARY
Glucose-Capillary: 221 mg/dL — ABNORMAL HIGH (ref 70–99)
Glucose-Capillary: 246 mg/dL — ABNORMAL HIGH (ref 70–99)
Glucose-Capillary: 270 mg/dL — ABNORMAL HIGH (ref 70–99)
Glucose-Capillary: 189 mg/dL — ABNORMAL HIGH (ref 70–99)
Glucose-Capillary: 164 mg/dL — ABNORMAL HIGH (ref 70–99)
Glucose-Capillary: 128 mg/dL — ABNORMAL HIGH (ref 70–99)

## 2020-01-21 LAB — BASIC METABOLIC PANEL
Anion gap: 10 (ref 5–15)
Anion gap: 11 (ref 5–15)
BUN: 17 mg/dL (ref 8–23)
BUN: 21 mg/dL (ref 8–23)
CO2: 26 mmol/L (ref 22–32)
CO2: 25 mmol/L (ref 22–32)
Calcium: 8.5 mg/dL — ABNORMAL LOW (ref 8.9–10.3)
Calcium: 8.3 mg/dL — ABNORMAL LOW (ref 8.9–10.3)
Chloride: 102 mmol/L (ref 98–111)
Chloride: 99 mmol/L (ref 98–111)
Creatinine, Ser: 0.82 mg/dL (ref 0.61–1.24)
Creatinine, Ser: 0.8 mg/dL (ref 0.61–1.24)
GFR calc Af Amer: >60 mL/min (ref >60)
GFR calc Af Amer: >60 mL/min (ref >60)
GFR calc non Af Amer: >60 mL/min (ref >60)
GFR calc non Af Amer: >60 mL/min (ref >60)
Glucose, Bld: 254 mg/dL — ABNORMAL HIGH (ref 70–99)
Glucose, Bld: 186 mg/dL — ABNORMAL HIGH (ref 70–99)
Potassium: 3.5 mmol/L (ref 3.5–5.1)
Potassium: 3.4 mmol/L — ABNORMAL LOW (ref 3.5–5.1)
Sodium: 138 mmol/L (ref 135–145)
Sodium: 135 mmol/L (ref 135–145)

## 2020-01-21 LAB — MAGNESIUM: Magnesium: 1.9 mg/dL (ref 1.7–2.4)

## 2020-01-21 LAB — COOXEMETRY PANEL
Carboxyhemoglobin: 1.4 % (ref 0.5–1.5)
Methemoglobin: 0.7 % (ref 0.0–1.5)
O2 Saturation: 79.7 %
Total hemoglobin: 15.7 g/dL (ref 12.0–16.0)

## 2020-01-21 LAB — PROCALCITONIN: Procalcitonin: 0.27 ng/mL

## 2020-01-21 SURGERY — IR WITH ANESTHESIA
Anesthesia: General

## 2020-01-21 MED ORDER — POTASSIUM CHLORIDE 20 MEQ/15ML (10%) PO SOLN
40.0000 meq | Freq: Once | ORAL | Status: AC
  Administered 2020-01-21: 40 meq via ORAL
  Filled 2020-01-21: qty 30

## 2020-01-21 MED ORDER — POTASSIUM CHLORIDE 10 MEQ/50ML IV SOLN
10.0000 meq | INTRAVENOUS | Status: AC
  Administered 2020-01-21 (×3): 10 meq via INTRAVENOUS
  Filled 2020-01-21 (×3): qty 50

## 2020-01-21 MED ORDER — HEPARIN SODIUM (PORCINE) 5000 UNIT/ML IJ SOLN
5000.0000 [IU] | Freq: Three times a day (TID) | INTRAMUSCULAR | Status: DC
  Administered 2020-01-21 – 2020-01-23 (×5): 5000 [IU] via SUBCUTANEOUS
  Filled 2020-01-21 (×4): qty 1

## 2020-01-21 MED ORDER — INSULIN DETEMIR 100 UNIT/ML ~~LOC~~ SOLN
35.0000 [IU] | Freq: Two times a day (BID) | SUBCUTANEOUS | Status: DC
  Administered 2020-01-21 – 2020-01-24 (×6): 35 [IU] via SUBCUTANEOUS
  Filled 2020-01-21 (×9): qty 0.35

## 2020-01-21 MED ORDER — POTASSIUM CHLORIDE 10 MEQ/50ML IV SOLN
10.0000 meq | INTRAVENOUS | Status: AC
  Administered 2020-01-21 (×3): 10 meq via INTRAVENOUS
  Filled 2020-01-21 (×3): qty 50

## 2020-01-21 MED ORDER — MAGNESIUM SULFATE 2 GM/50ML IV SOLN
2.0000 g | Freq: Once | INTRAVENOUS | Status: AC
  Administered 2020-01-21: 2 g via INTRAVENOUS
  Filled 2020-01-21: qty 50

## 2020-01-21 NOTE — Social Work (Signed)
CSW met with pt at bedside. CSW introduced self and explained her role. CSW completed sbirt with pt. Pt was able to shake head no. Pt scored a 0 on the sbirt scale. Pt denied alcohol use. Pt denied substance use. Pt did not need resources at this time.  Emeterio Reeve, Latanya Presser, [place] Social Worker [phone]

## 2020-01-21 NOTE — Progress Notes (Signed)
STROKE TEAM PROGRESS NOTE   INTERVAL HISTORY RN at bedside.  Patient lying in bed, eyes open, more awake alert.  Still has left-sided weakness, leg more than arm, however, stable from yesterday.  On 2 pressors, BP maintained at goal.  TCD pending today.  OBJECTIVE Vitals:   01/21/20 0800 01/21/20 0815 01/21/20 0830 01/21/20 0845  BP: (!) 188/97 (!) 189/90 (!) 190/104 (!) 182/96  Pulse: 86 87 89 82  Resp:      Temp: 99.7 F (37.6 C)     TempSrc: Oral     SpO2: 99% 100% 100% 100%  Weight:      Height:        CBC:  Recent Labs  Lab 01/20/20 0110 01/21/20 0535  WBC 18.5* 20.5*  HGB 13.2 15.5  HCT 40.6 47.3  MCV 90.2 87.9  PLT 218 161    Basic Metabolic Panel:  Recent Labs  Lab 01/17/20 0250 01/19/20 0456 01/20/20 0110 01/20/20 0201 01/21/20 0535  NA 141   < > 141  --  138  K 4.0   < > 3.5  --  3.5  CL 107   < > 102  --  102  CO2 25   < > 28  --  26  GLUCOSE 182*   < > 342*  --  254*  BUN 23   < > 12  --  17  CREATININE 0.76   < > 0.81  --  0.82  CALCIUM 8.3*   < > 8.9  --  8.5*  MG 1.8  --   --  1.9  --   PHOS 2.8  --   --  1.2*  --    < > = values in this interval not displayed.    Lipid Panel:     Component Value Date/Time   CHOL 88 01/19/2020 0456   TRIG 124 01/19/2020 0456   HDL 26 (L) 01/19/2020 0456   CHOLHDL 3.4 01/19/2020 0456   VLDL 25 01/19/2020 0456   LDLCALC 37 01/19/2020 0456   HgbA1c:  Lab Results  Component Value Date   HGBA1C 7.7 (H) 01/14/2020   Urine Drug Screen:     Component Value Date/Time   LABOPIA NONE DETECTED 01/19/2020 1058   COCAINSCRNUR NONE DETECTED 01/19/2020 1058   LABBENZ NONE DETECTED 01/19/2020 1058   AMPHETMU NONE DETECTED 01/19/2020 1058   THCU POSITIVE (A) 01/19/2020 1058   LABBARB NONE DETECTED 01/19/2020 1058     IMAGING past 24h No results found.   Transcranial Doppler 01/16/2020 Elevated right middle cerebral and carotid siphon and left middle cerebral artery and carotid siphon mean flow velocities  suggest mild vasospasm. Slight improvement compared with prior TCD study 01/14/20  01/19/2020 Improvement in vasospasm overall but persistent elevated right middle cerebral and left carotid siphon mean flow velocities suggestive of mild vasospasm.  01/21/2020 pending   PHYSICAL EXAM   Temp:  [97.6 F (36.4 C)-100.4 F (38 C)] 99.7 F (37.6 C) (05/03 0800) Pulse Rate:  [68-95] 82 (05/03 0845) Resp:  [13-25] 23 (05/03 0730) BP: (176-221)/(71-202) 182/96 (05/03 0845) SpO2:  [74 %-100 %] 100 % (05/03 0845) Weight:  [90.3 kg] 90.3 kg (05/03 0500)  General - Well nourished, well developed, eyes open, not in acute distress.  Ophthalmologic - fundi not visualized due to noncooperation.  Cardiovascular - Regular rate and rhythm.  Neuro - awake alert, eye open. Able to answer questions appropriately, orientated to place and people and age, no dysarthria. B/l CN VI  imcomplete gaze. Mild right ptosis, but PERRL. Inconsistently blinking to visual threat bilaterally. Left facial droop, tongue protrusion to the left. Left UE 3/5 proximal and 2+/5 distal finger grip, LLE 2/5 proximal and distally. RUE and RLE at least 4/5 and following commands. B/l babinski positive. Sensation subjectively symmetrical, coordination intact right FTN but not cooperative on the left FTN and gait not tested.   ASSESSMENT/PLAN Mr. Nicholas [name] is a 69 y.o. male with history of diabetes, migraines, herpes simplex virus 1, hypertension, hyperlipidemia, hepatitis A, and benign prostatic hypertrophy transported from [facility] after developing sudden onset of severe headache initially with mild left upper extremity drift but still able to carry on a conversation. Later he developed fixed right gaze deviation as well as increased left arm and left leg weakness but was still able to communicate and speak.  He did not receive IV t-PA due to ICH.  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 50% diameter stenosis proximal left internal carotid artery. Mild to moderate stenosis in the cavernous carotid bilaterally due to atherosclerotic calcification  CTA head 4/30 - Overall decreased volume of subarachnoid hemorrhage since 01/15/2020. Diminished flow within the right ICA with no enhancement visualized at and beyond the cavernous segment. Mild increased right M1 MCA narrowing and diminished distal right MCA branch filling. New narrowing of the left A1 ACA with severe stenosis near the origin. Persistent irregularity of the distal basilar artery with new superimposed severe stenosis.  IR - significant improvement in vessel caliber and distal territory perfusion after balloon angioplasty of the right internal carotid artery, right middle cerebral artery, and basilar artery  MRI head - Multiple small acute infarcts the right cerebral hemisphere primarily involving the anterior greater than posterior circulations. Thin left larger than right subdural collections with stable mild rightward midline shift.   MRA head - Improved flow within the intracranial right ICA compared to 01/18/2020 CTA. Appearance is now similar to 01/14/2020 CTA with persistent severe stenosis of the supraclinoid portion. Likely persistent left A1 ACA stenosis and distal basilar stenosis.  TCD 4/28 - Elevated right middle cerebral and carotid siphon and left middle cerebral artery and carotid siphon mean flow velocities suggest mild vasospasm. Slight improvement compared with prior TCD study 01/14/20   TCD 5/1- improvement but still mild vasospasm  TCD 5/3 - pending  2D Echo - EF 70 - 75%. Grade I diastolic dysfunction (impaired relaxation).   Sars Corona Virus 2 - negative  LDL - 37  HgbA1c - 7.7  UDS  - positive for THCU  VTE prophylaxis - SCDs  aspirin 81 mg daily prior to admission, now on No antithrombotic  Ongoing aggressive stroke risk factor management  Therapy recommendations:  pending  Disposition:  Pending  Stroke - due to vasospasm  CTA head 4/30 - Overall decreased volume of subarachnoid hemorrhage since 01/15/2020. Diminished flow within the right ICA with no enhancement visualized at and beyond the cavernous segment. Mild increased right M1 MCA narrowing and diminished distal right MCA branch filling. New narrowing of the left A1 ACA with severe stenosis near the origin. Persistent irregularity of the distal basilar artery with new superimposed severe stenosis.  IR - significant improvement in vessel caliber and distal territory perfusion after balloon angioplasty of  the right internal carotid artery, right middle cerebral artery, and basilar artery  MRI head - Multiple small acute infarcts the right cerebral hemisphere primarily involving the anterior greater than posterior circulations. Thin left larger than right subdural collections with stable mild rightward midline shift.   MRA head - Improved flow within the intracranial right ICA compared to 01/18/2020 CTA. Appearance is now similar to 01/14/2020 CTA with persistent severe stenosis of the supraclinoid portion. Likely persistent left A1 ACA stenosis and distal basilar stenosis.  BP goal 180-220  On Neo, vasopressin, IVF  completed albumin 4 doses  Change nimodipine 60mg  Q4h to 30mg  Q2h  BP 170-190s  Hypertension Triple H therepy  Home BP meds: Lisinopril  Current BP meds: Nimodipine   SBP goal - 180 - 220   On Neo and vasopressin  On IVF @125   Completed albumin  Hyperlipidemia  Home Lipid lowering medication: Lipitor 80 mg daily and Fenofibrate (Crestor 40 mg daily) Lovaza  LDL - 37, goal < 70  Current lipid lowering medication: Crestor 40 mg daily and Fenofibrate  Continue statin and  fenofibrate at discharge  Diabetes  Home diabetic meds: insulin, glucotrol, Actos, Janumet, Xigduo  Current diabetic meds: levemir 35 bid, novolog 10 u tid  HgbA1c 7.7, goal < 7.0  SSI  CBG monitoring  DM coordination consult placed  Fever   Tmax 100.7 -> afebrile  Leukocytosis WBCs - 14.8->18.5->20.5  Urine culture 4/26 neg   CXR 5/1 - Cardiomegaly without acute abnormality of the lungs in AP portable projection.  UA - neg  blood culture no growth < 24h   Other Stroke Risk Factors  Advanced age  ETOH use, advised to drink no more than 1 alcoholic beverage per day.  Migraines  Other Active Problems  Code status - Full code  Keppra seizure prophelaxis  Hospital day # 7  This patient is critically ill due to stroke, SAH with cerebral vasospasm, left hemiplegia and at significant risk of neurological worsening, death form cerebral edema, recurrent stroke, recurrent bleeding seizure, hydrocephalus. This patient's care requires constant monitoring of vital signs, hemodynamics, respiratory and cardiac monitoring, review of multiple databases, neurological assessment, discussion with family, other specialists and medical decision making of high complexity. I spent 35 minutes of neurocritical care time in the care of this patient.  , MD PhD Stroke Neurology 01/21/2020 10:41 AM  To contact Stroke Continuity provider, please refer to 5/26. After hours, contact General Neurology

## 2020-01-21 NOTE — Progress Notes (Signed)
OT Cancellation Note  Patient Details Name: Nicholas [name] MRN: [id number] DOB: 1950-10-01   Cancelled Treatment:    Reason Eval/Treat Not Completed: (P) Medical issues which prohibited therapy;Patient not medically ready  [name],[name] 01/21/2020, 9:09 AM  Luisa Dago, OT/L   Acute OT Clinical Specialist Acute Rehabilitation Services Pager [phone] Office [phone]

## 2020-01-21 NOTE — Progress Notes (Signed)
PT Cancellation Note  Patient Details Name: Nicholas [name] MRN: [id number] DOB: 1950-09-25   Cancelled Treatment:    Reason Eval/Treat Not Completed: Patient not medically ready. Pt now with high BP and heart complications that MD team is trying to figure out. Pt with medical decline since Friday. Acute PT to return as able, as appropriate.  Lewis Shock, PT, DPT Acute Rehabilitation Services Pager #: [phone] Office #: [phone]    Iona Hansen 01/21/2020, 8:25 AM

## 2020-01-21 NOTE — Progress Notes (Signed)
Transcranial Doppler  Date POD PCO2 HCT BP  MCA ACA PCA OPHT SIPH VERT Basilar  4/27 MS     Right  Left   197  64   *  105   12  -17   27  23    150  29   -23  -31   -36      4/28,rs     Right  Left   134  131   -31  -92   38  -24   57  50   153  136   -18  -31   -42      5/1 GC     Right  Left   96  50   *  *   30  37   30  25   14   95   -60  -22   -63      5/1 MR      Right  Left   127  59   -14  *   36  *   47  34   36  103   -42  -18   -62            Right  Left                                            Right  Left                                            Right  Left                                        MCA = Middle Cerebral Artery      OPHT = Opthalmic Artery     BASILAR = Basilar Artery   ACA = Anterior Cerebral Artery     SIPH = Carotid Siphon PCA = Posterior Cerebral Artery   VERT = Verterbral Artery                   Normal MCA = 62+\-12 ACA = 50+\-12 PCA = 42+\-23  *Unable to insonate 01/15/2020- Right Lindegaard ratio=7.3, left Lindegaard ratio=1.68 01/16/20 - Right Lindegaard ratio = 3.3, left Lindegaard ratio = 6.2  Tatem Fesler 01/21/2020 2:53 PM

## 2020-01-21 NOTE — Progress Notes (Signed)
  NEUROSURGERY PROGRESS NOTE   Pt seen and examined. No issues overnight. Minimal HA this am.  EXAM: Temp:  [97.6 F (36.4 C)-100.4 F (38 C)] 99.7 F (37.6 C) (05/03 0800) Pulse Rate:  [68-95] 85 (05/03 0600) Resp:  [13-26] 25 (05/03 0600) BP: (163-221)/(62-202) 190/92 (05/03 0600) SpO2:  [74 %-99 %] 96 % (05/03 0600) Weight:  [90.3 kg] 90.3 kg (05/03 0500) Intake/Output      05/02 0701 - 05/03 0700 05/03 0701 - 05/04 0700   P.O. 240    I.V. (mL/kg) 4899.4 (54.3)    IV Piggyback 319    Total Intake(mL/kg) 5458.4 (60.4)    Urine (mL/kg/hr) 6800 (3.1)    Stool 0    Total Output 6800    Net -1341.6         Stool Occurrence 5 x     Awake, alert, oriented Speech fluent Mild left central facial droop Good strength RUE/RLE 2-3/5 LUE, including 3/5 grip Minimal voluntary movement RLE, wiggles toes  LABS: Lab Results  Component Value Date   CREATININE 0.82 01/21/2020   BUN 17 01/21/2020   NA 138 01/21/2020   K 3.5 01/21/2020   CL 102 01/21/2020   CO2 26 01/21/2020   Lab Results  Component Value Date   WBC 20.5 (H) 01/21/2020   HGB 15.5 01/21/2020   HCT 47.3 01/21/2020   MCV 87.9 01/21/2020   PLT 214 01/21/2020    IMAGING: MRI/MRA 01/19/20 reviewed, demonstrates multiple right hemispheric strokes, MRA indicates improved spasm of the supraclinoid ICA bilaterally and basilar artery, although carotid spasm remains severe.  IMPRESSION: - 69 y.o. male SAH d# 7, angio negative with severe symptomatic 3 vessel vasospasm, responded reasonably well to chemical/mechanical angioplasty and hyperdynamic treatment. Unable to tolerate milrinone over weekend due to hypotension  PLAN: - Cont hyperdynamic therapy - SBP goal remains >180 mmHg, IVF hydration - TCD today - Cont Nimotop - PT/OT

## 2020-01-21 NOTE — Progress Notes (Signed)
PULMONARY / CRITICAL CARE MEDICINE   NAME:  Nicholas [name], MRN:  [id number], DOB:  Sep 23, 1950, LOS: 7 ADMISSION DATE:  01/14/2020, CONSULTATION DATE:  01/14/20 REFERRING MD: ED Physician, CHIEF COMPLAINT:  SAH  BRIEF HISTORY:    69 yo male who presented for 24 hours of progressive headache and nuchal rigidity with associated nausea, vomiting and confusion. Patient was transported by EMS to [facility] for imaging and transferred to [facility] for further evaluation.  Both CTA and catheter angiography negative for aneurysm due to presence of vasospasm. Severe symptomatic 3 vessel vasospasm and has responded reasonably well to chemical/mechanical angioplasty and hyperdynamic treatment.  SIGNIFICANT PAST MEDICAL HISTORY   He  has a past medical history of BPH (benign prostatic hypertrophy) (12/20/2011), Colon polyp, Deviated septum, Diabetes mellitus without complication (HCC), Hepatitis A, HSV-1 (herpes simplex virus 1) infection, Hyperlipidemia, Hypertension, and Migraine headache.  SIGNIFICANT EVENTS:  CVL 4/30 A-line 4/30 Diagnostic cerebral angiogram with balloon angioplasty 4/30  STUDIES:   Echo 4/27: LVEF 70-75%, grade I diastolic dysfunction TCD 5/1: Improvement in vasospasm overall but persistent elevated right middle  cerebral and left carotid siphon mean flow velocities suggestive of mild  vasospasm.  MRA head 5/1: Multiple small acute infarcts the right cerebral hemisphere primarily involving the anterior greater than posterior circulations. Thin left larger than right subdural collections with stable mild rightward midline shift. Residual subarachnoid hemorrhage likely similar to prior CT.  CULTURES:  COVID 4/26 > negative MRSA PCR 4/26 > negative  Blood culture 5/2: no growth <24 hours Urine culture 4/26: no growth  ANTIBIOTICS:  None  LINES/TUBES:  CVC triple lumen catheter (left subclavian) 01/18/20 Peripheral IV left wrist 01/18/20 Foley urinary catheter CONSULTANTS:   Neurology, neurosurgery SUBJECTIVE:  5/3: Pt is lying in bed in no acute distress, he is alert and oriented x3. Denies any acute complaints. Slowed responses but appropriate.  CONSTITUTIONAL: BP (!) 190/92   Pulse 85   Temp 99.7 F (37.6 C) (Oral)   Resp (!) 25   Ht 6' (1.829 m)   Wt 90.3 kg   SpO2 96%   BMI 27.00 kg/m   I/O last 3 completed shifts: In: 9961.9 [P.O.:240; I.V.:8011; IV Piggyback:1710.9] Out: 09326 [Urine:12650]  CVP:  [6 mmHg-23 mmHg] 9 mmHg     PHYSICAL EXAM: General: Pleasant male lying in bed, NAD  HEENT: Reader/AT, MM pink/moist, PERRL, able to actively turn neck from right side with preference to midline Neuro: Alert and oriented x3, 3/5 strength LLE, 4/5 strength LUE with decent grip. Mild left facial droop CV: regular rate and rhythm, no murmur, rubs, or gallops,  PULM:  Clear to ascultation bilaterally, no increased work of breathing GI: soft, bowel sounds active in all 4 quadrants, non-tender, non-distended Extremities: warm/dry, no edema  Skin: no rashes or lesions  IMAGING  CT ANGIO HEAD W OR WO CONTRAST 01/18/2020 IMPRESSION:   Overall decreased volume of subarachnoid hemorrhage since 01/15/2020.   New left subdural hygroma and subcentimeter rightward midline shift.   Resolution of hydrocephalus.   Further significantly diminished flow within the right ICA with no enhancement visualized at and beyond the cavernous segment.   Mild increased right M1 MCA narrowing and diminished distal right MCA branch filling.   New narrowing of the left A1 ACA with severe stenosis near the origin.   Persistent irregularity of the distal basilar artery with new superimposed severe stenosis.   IR PTA Vasospasm Initial IR PTA Vasospasm Add Diff IR ENDOVASC INTRACRANIAL INF OTHER THAN  THROMBO ART INC DIAG ANGIO IR ENDOVASC INTRACRANIAL INF OTHER THAN THROMBO ART INC DIAG ANGIO EA ADD 01/18/2020 IMPRESSION:   Severe vasospasm involving the supraclinoid  segments of the internal carotid arteries bilaterally and the length of the basilar artery.   There is significant improvement in vessel caliber and distal territory perfusion after balloon angioplasty of the right internal carotid artery, right middle cerebral artery, and basilar artery.   MRI / MRA Head/ Brain 01/19/2020 IMPRESSION:  Multiple small acute infarcts the right cerebral hemisphere primarily involving the anterior greater than posterior circulations.  Thin left larger than right subdural collections with stable mild rightward midline shift.  Residual subarachnoid hemorrhage likely similar to prior CT.  Suboptimal vascular evaluation due to intrinsic T1 hyperintensity of hemorrhage. Improved flow within the intracranial right ICA compared to 01/18/2020 CTA.   Appearance is now similar to 01/14/2020 CTA with persistent severe stenosis of the supraclinoid portion. Likely persistent left A1 ACA stenosis and distal basilar stenosis.  DG CHEST PORT 1 VIEW 01/18/2020 IMPRESSION:  1. Left subclavian approach central line placed, tip at the lower SVC level.  2. No pneumothorax or acute cardiopulmonary abnormality.  DG CHEST PORT 1 VIEW 01/18/2020 IMPRESSION:  1.  Cardiomegaly.  No pulmonary venous congestion.  2.  Low lung volumes.  Mild bibasilar infiltrates.   DG Chest Port 1 View 12/09/19 IMPRESSION: Cardiomegaly without acute abnormality of the lungs in AP portable projection.  Transthoracic Echocardiogram  01/15/2020 IMPRESSIONS  1. Normal LV systolic function; proximal septal thickening; grade 1  diastolic dysfunction; mildly dilated aortic root; scerotic aortic valve.  2. Left ventricular ejection fraction, by estimation, is 70 to 75%. The  left ventricle has hyperdynamic function. The left ventricle has no  regional wall motion abnormalities. There is mild left ventricular  hypertrophy of the basal segment. Left  ventricular diastolic parameters are  consistent with Grade I diastolic  dysfunction (impaired relaxation).  3. Right ventricular systolic function is normal. The right ventricular  size is normal.  4. The mitral valve is normal in structure. No evidence of mitral valve  regurgitation. No evidence of mitral stenosis.  5. The aortic valve is tricuspid. Aortic valve regurgitation is not  visualized. Mild to moderate aortic valve sclerosis/calcification is  present, without any evidence of aortic stenosis.  6. Aortic dilatation noted. There is mild dilatation of the aortic root  measuring 38 mm.  7. The inferior vena cava is normal in size with greater than 50%  respiratory variability, suggesting right atrial pressure of 3 mmHg.   Transcranial Doppler 01/16/2020 Summary:  Elevated right middle cerebral and carotid siphon and left middle cerebral artery and carotid siphon mean flow velocities suggest mild vasospasm.  Slight improvement compared with prior TCD study 01/14/20   ECG - ST rate 105 BPM. (See cardiology reading for complete details)  RESOLVED PROBLEM LIST  None ASSESSMENT AND PLAN   Mr. Duval is a 69 yo with a history of hypertension, migraines, HSV-1, diabetes, HLD, hepatitis A, and BPH who was transported from [facility] after suddenly developing a severe headache and nuchal rigidity with associated left hemiplegia. CTA revealed large SAH in the basilar cisterns extending into bilateral sylvian fissures. 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 He experienced clinical vasospasm of basilar and right ICA s/p angioplasty. He is currently on [facility] treatment.  #SAH with vasospasm -management per neurology -TCD today -continue HHH treatment -hypertension: goal SBP  180-220. On Neo, vasopressin. D/c Levophed yesterday d/t ectopy likely associated with increased adrenergic tone and electrolyte abnormalities. -Nimodipine 30mg   q2h. -hypervolemia: completed albumin 4 doses. IVF NS @ 125 mL/hr -hemodilution -continue frequent neuro checks. Maintain neuro protective measures including goal for normoxia, euglycemia, eunatremia, and eurothermia.  #Low grade fevers -most likely due to atelectasis vs neurogenic fever associated with SAH. Neurogenic fever can result from a disruption in the hypothalamic set point temperature -> abnormal increase in body temperature associated with injury to hypothalamus. -WBC 18.5 (5/2) -> 20.5 (5/3). Ordered CBC with diff tommorrow am. -Will check procal level  -encourage pulm hygiene -tmax 100.7 -cxr in am -cont to hold on abx for now.   #Prolonged QT interval -most likely d/t electrolyte derangements. Currently receiving calcium, mag, and K+ repletion -goals Mag >2 and K >4 -repeat levels at 1400 and replace again if necessary.  -will repeat BMP, mag tomorrow am  #Glycemic control -CBC monitoring q4h with SSI coverage -NovoLOG 10u TID -Will increase Levemir to 35u BID  SUMMARY OF TODAY'S PLAN:  Repeat BMP, mag, and CBC with diff tomorrow am. Will order procal to further evaluate low grade fever and rising WBC. Will increase levemir to 35u BID to improve glucose control.  Best Practice / Goals of Care / Disposition.   Diet: diabetic Pain/Anxiety/Delirium protocol (if indicated): PRNs VAP protocol (if indicated): Pulmonary hygiene  DVT prophylaxis: SCD GI prophylaxis:PPI Glucose control: see above Mobility: Bedrest Code Status: Full FAMILY DISCUSSIONS: Per primary DISPOSITION: continue Neuro ICU care  LABS  Glucose Recent Labs  Lab 01/20/20 1117 01/20/20 1509 01/20/20 1931 01/20/20 2309 01/21/20 0315 01/21/20 0724  GLUCAP 228* 148* 194* 290* 221* 246*    BMET Recent Labs  Lab 01/19/20 1420 01/20/20 0110 01/21/20 0535  NA 145 141 138  K 3.2* 3.5 3.5  CL 104 102 102  CO2 29 28 26   BUN 12 12 17   CREATININE 0.87 0.81 0.82  GLUCOSE 228* 342* 254*     Liver Enzymes Recent Labs  Lab 01/14/20 1021  AST 28  ALT 41  ALKPHOS 44  BILITOT 1.3*  ALBUMIN 5.2*    Electrolytes Recent Labs  Lab 01/16/20 0416 01/16/20 0416 01/17/20 0250 01/19/20 0456 01/19/20 1420 01/20/20 0110 01/20/20 0201 01/21/20 0535  CALCIUM 8.9   < > 8.3*   < > 8.7* 8.9  --  8.5*  MG 2.1  --  1.8  --   --   --  1.9  --   PHOS 3.6  --  2.8  --   --   --  1.2*  --    < > = values in this interval not displayed.    CBC Recent Labs  Lab 01/19/20 0456 01/20/20 0110 01/21/20 0535  WBC 14.8* 18.5* 20.5*  HGB 13.0 13.2 15.5  HCT 40.2 40.6 47.3  PLT 224 218 214    ABG No results for input(s): PHART, PCO2ART, PO2ART in the last 168 hours.  Coag's Recent Labs  Lab 01/14/20 1022  APTT 27  INR 1.1    Sepsis Markers Recent Labs  Lab 01/14/20 1021 01/14/20 1230  LATICACIDVEN 3.2* 2.7*    Cardiac Enzymes No results for input(s): TROPONINI, PROBNP in the last 168 hours.  Critical care time: The patient is critically ill with multiple organ systems failure and requires high complexity decision making for assessment and support, frequent evaluation and titration of therapies, application of advanced monitoring technologies and extensive interpretation of multiple databases.  Critical care  time 37 mins. This represents my time independent of the NPs time taking care of the pt. This is excluding procedures.    Briant Sites DO  Pulmonary and Critical Care 01/21/2020, 11:54 AM

## 2020-01-22 ENCOUNTER — Inpatient Hospital Stay (HOSPITAL_COMMUNITY): Payer: BC Managed Care – PPO

## 2020-01-22 DIAGNOSIS — I609 Nontraumatic subarachnoid hemorrhage, unspecified: Secondary | ICD-10-CM | POA: Diagnosis not present

## 2020-01-22 DIAGNOSIS — I63231 Cerebral infarction due to unspecified occlusion or stenosis of right carotid arteries: Secondary | ICD-10-CM

## 2020-01-22 LAB — BASIC METABOLIC PANEL
Anion gap: 8 (ref 5–15)
BUN: 23 mg/dL (ref 8–23)
CO2: 27 mmol/L (ref 22–32)
Calcium: 8.4 mg/dL — ABNORMAL LOW (ref 8.9–10.3)
Chloride: 102 mmol/L (ref 98–111)
Creatinine, Ser: 0.78 mg/dL (ref 0.61–1.24)
GFR calc Af Amer: >60 mL/min (ref >60)
GFR calc non Af Amer: >60 mL/min (ref >60)
Glucose, Bld: 167 mg/dL — ABNORMAL HIGH (ref 70–99)
Potassium: 3.7 mmol/L (ref 3.5–5.1)
Sodium: 137 mmol/L (ref 135–145)

## 2020-01-22 LAB — COOXEMETRY PANEL
Carboxyhemoglobin: 1.5 % (ref 0.5–1.5)
Methemoglobin: 0.6 % (ref 0.0–1.5)
O2 Saturation: 71.7 %
Total hemoglobin: 16.3 g/dL — ABNORMAL HIGH (ref 12.0–16.0)

## 2020-01-22 LAB — CBC WITH DIFFERENTIAL/PLATELET
Abs Immature Granulocytes: 0.2 10*3/uL — ABNORMAL HIGH (ref 0.00–0.07)
Basophils Absolute: 0.1 10*3/uL (ref 0.0–0.1)
Basophils Relative: 0 %
Eosinophils Absolute: 0 10*3/uL (ref 0.0–0.5)
Eosinophils Relative: 0 %
HCT: 49.3 % (ref 39.0–52.0)
Hemoglobin: 16.5 g/dL (ref 13.0–17.0)
Immature Granulocytes: 1 %
Lymphocytes Relative: 10 %
Lymphs Abs: 2 10*3/uL (ref 0.7–4.0)
MCH: 29.2 pg (ref 26.0–34.0)
MCHC: 33.5 g/dL (ref 30.0–36.0)
MCV: 87.3 fL (ref 80.0–100.0)
Monocytes Absolute: 2.5 10*3/uL — ABNORMAL HIGH (ref 0.1–1.0)
Monocytes Relative: 12 %
Neutro Abs: 15.4 10*3/uL — ABNORMAL HIGH (ref 1.7–7.7)
Neutrophils Relative %: 77 %
Platelets: 164 10*3/uL (ref 150–400)
RBC: 5.65 MIL/uL (ref 4.22–5.81)
RDW: 13.5 % (ref 11.5–15.5)
WBC: 20.2 10*3/uL — ABNORMAL HIGH (ref 4.0–10.5)
nRBC: 0 % (ref 0.0–0.2)

## 2020-01-22 LAB — GLUCOSE, CAPILLARY
Glucose-Capillary: 149 mg/dL — ABNORMAL HIGH (ref 70–99)
Glucose-Capillary: 215 mg/dL — ABNORMAL HIGH (ref 70–99)
Glucose-Capillary: 217 mg/dL — ABNORMAL HIGH (ref 70–99)
Glucose-Capillary: 137 mg/dL — ABNORMAL HIGH (ref 70–99)
Glucose-Capillary: 100 mg/dL — ABNORMAL HIGH (ref 70–99)
Glucose-Capillary: 113 mg/dL — ABNORMAL HIGH (ref 70–99)

## 2020-01-22 LAB — PROCALCITONIN: Procalcitonin: 0.27 ng/mL

## 2020-01-22 LAB — MAGNESIUM: Magnesium: 1.8 mg/dL (ref 1.7–2.4)

## 2020-01-22 LAB — PHOSPHORUS: Phosphorus: 2.7 mg/dL (ref 2.5–4.6)

## 2020-01-22 IMAGING — MR MR HEAD W/O CM
10 of 16 series · 28 of 48 positions shown · non-contrast
Comparison: Brain MRI and intracranial MRA [DATE] and earlier.

CLINICAL DATA: 68-year-old male who presented with acute headache
on [DATE] found to have subarachnoid hemorrhage and severe
intracranial Vasospasm which was treated with balloon angioplasty on
[DATE]. No intracranial aneurysm has been identified. Multiple
small acute infarcts in the right hemisphere on [DATE] MRI,
small subdural hematomas. Dense left hemiplegia now.

EXAM:
MRI HEAD WITHOUT CONTRAST
MRA HEAD WITHOUT CONTRAST
TECHNIQUE: Multiplanar, multiecho pulse sequences of the brain and surrounding
structures were obtained without intravenous contrast. Angiographic
images of the head were obtained using MRA technique without
contrast.

[Series 3: DWI · axial · 3.0mm · 1.09mm/px · z∈[-73,+78]mm · 6 of 104 slices shown (1 of 4)]
[im 1/104]
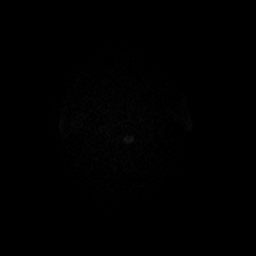
[im 21/104]
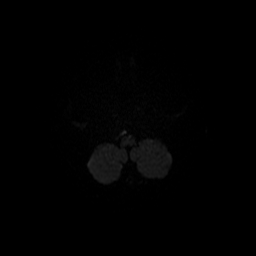
[im 42/104]
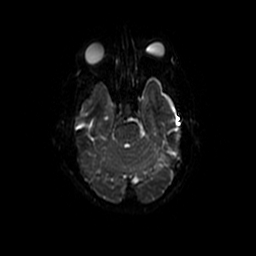
[im 62/104]
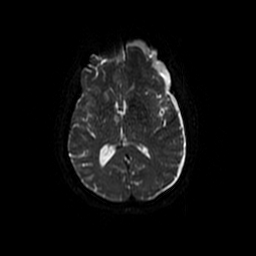
[im 83/104]
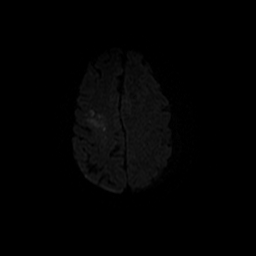
[im 104/104]
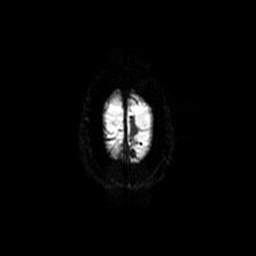

[Series 5: DWI · coronal · 5.0mm · 1.09mm/px · 4 of 66 slices shown (2 of 4)]
[im 1/66]
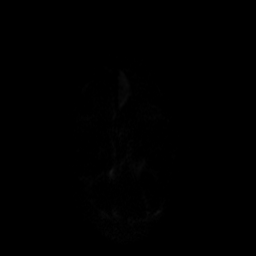
[im 22/66]
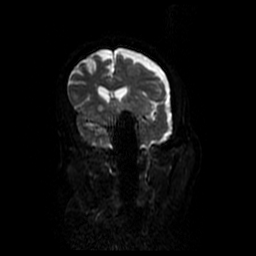
[im 44/66]
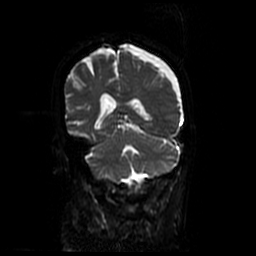
[im 66/66]
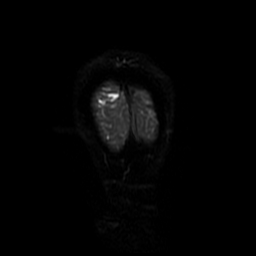

[Series 6: T1 · sagittal · 5.0mm · 0.47mm/px · 2 of 23 slices shown (1 of 2)]
[im 1/23]
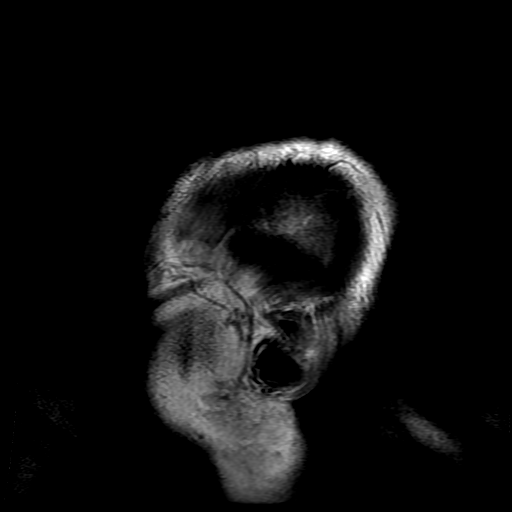
[im 23/23]
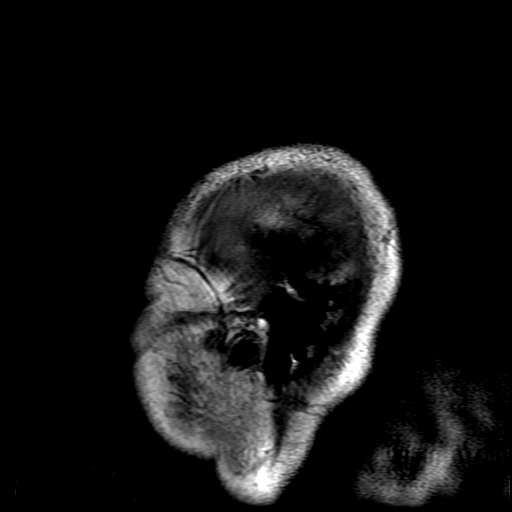

[Series 7: FLAIR · axial · 5.0mm · 0.43mm/px · z∈[-64,+90]mm · 2 of 27 slices shown]
[im 1/27]
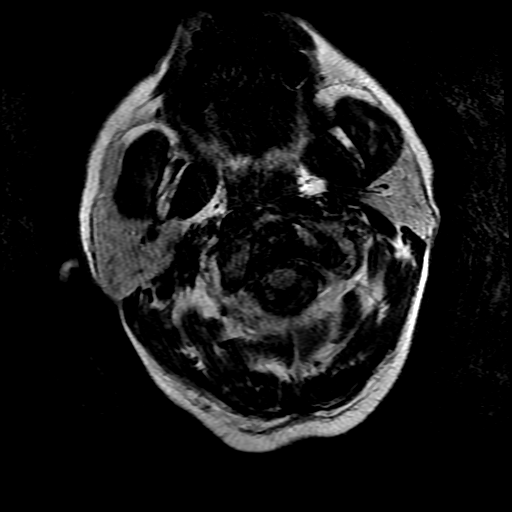
[im 27/27]
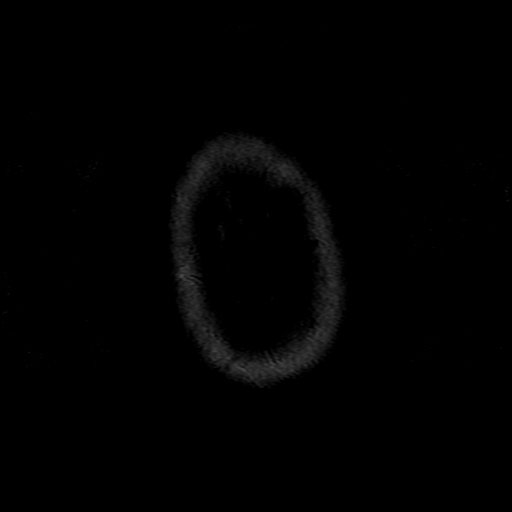

[Series 8: T2 · axial · 5.0mm · 0.43mm/px · z∈[-64,+90]mm · 2 of 27 slices shown (1 of 3)]
[im 1/27]
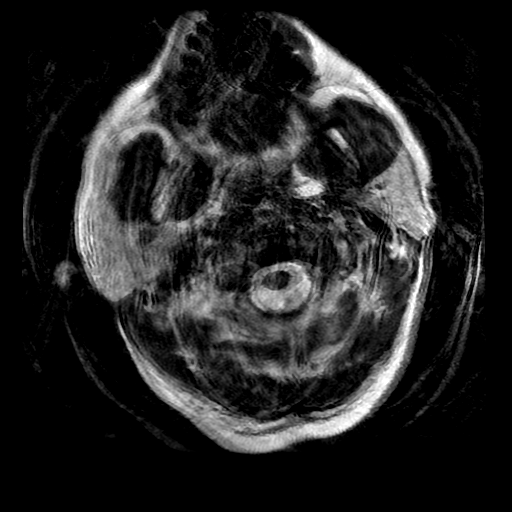
[im 27/27]
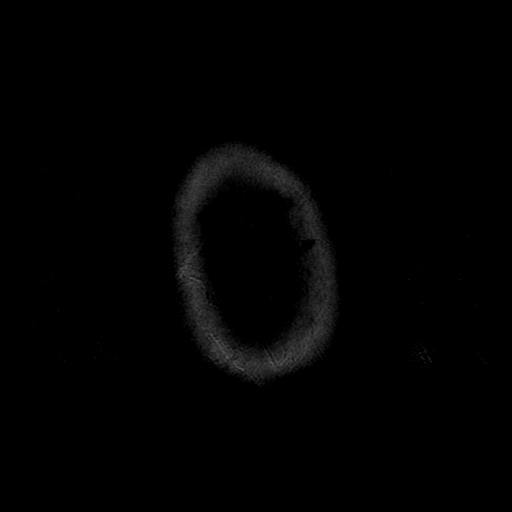

[Series 9: T2 · axial · 5.0mm · 0.43mm/px · z∈[-64,+90]mm · 2 of 27 slices shown (2 of 3)]
[im 1/27]
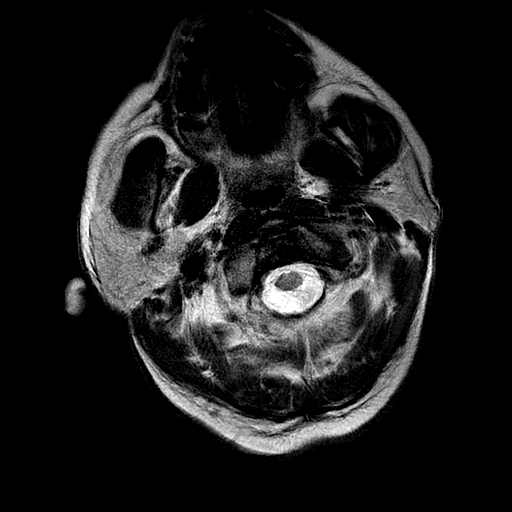
[im 27/27]
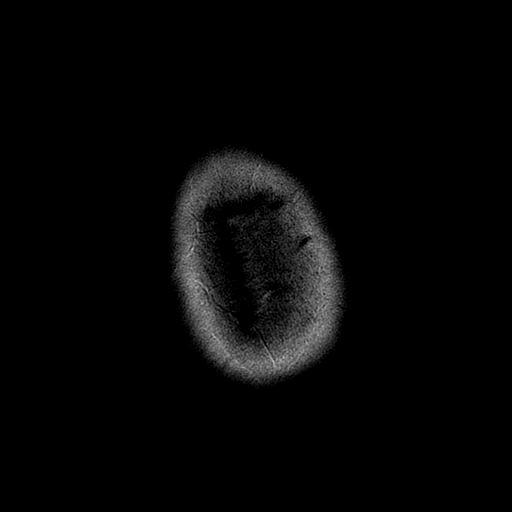

[Series 11: T1 · axial · 3.0mm · 0.47mm/px · z∈[-68,-42]mm · 2 of 108 slices shown (2 of 2)]
[im 1/108]
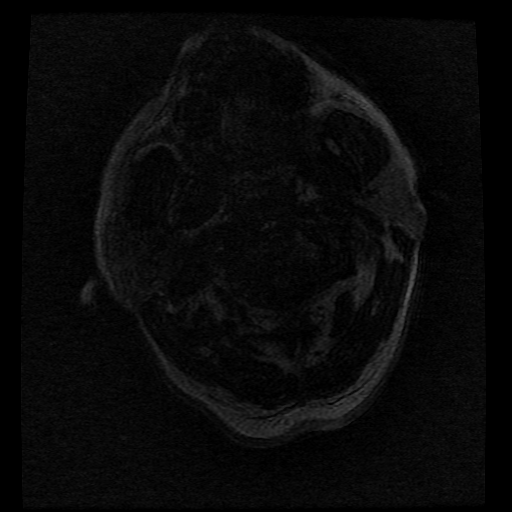
[im 18/108]
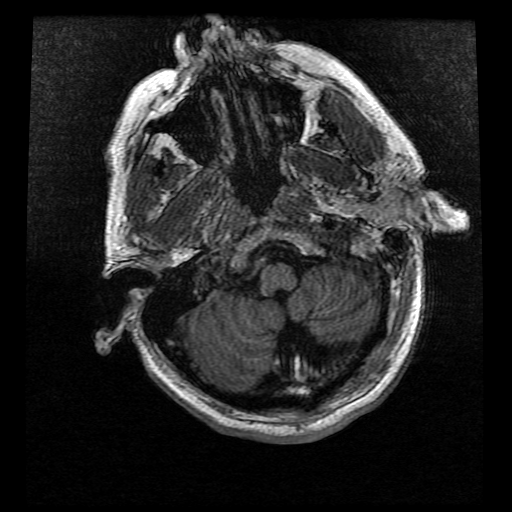

[Series 12: T2 · coronal · 5.0mm · 0.39mm/px · 2 of 25 slices shown (3 of 3)]
[im 1/25]
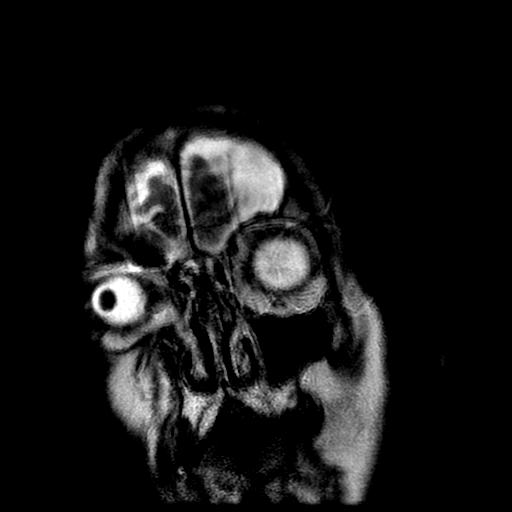
[im 25/25]
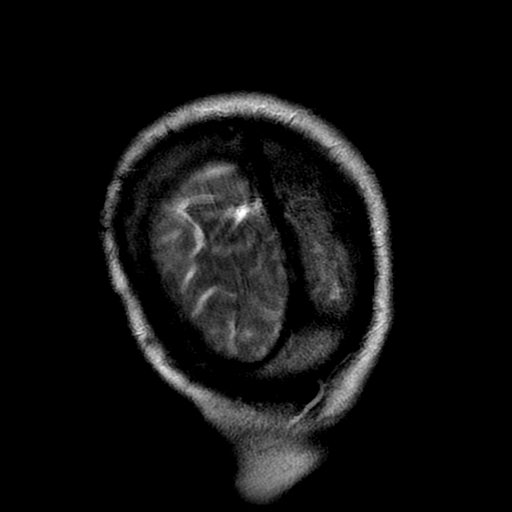

[Series 300: DWI · axial · 3.0mm · 1.09mm/px · z∈[-73,+78]mm · 4 of 52 slices shown (3 of 4)]
[im 1/52]
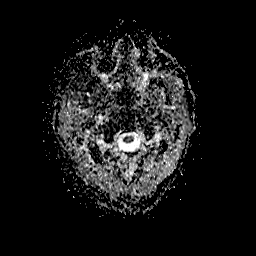
[im 18/52]
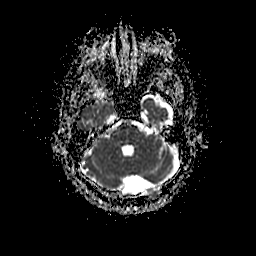
[im 35/52]
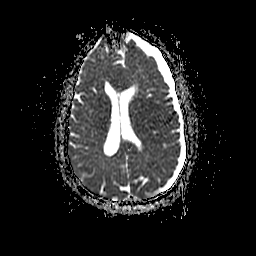
[im 52/52]
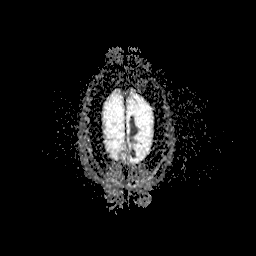

[Series 500: DWI · coronal · 5.0mm · 1.09mm/px · 2 of 33 slices shown (4 of 4)]
[im 1/33]
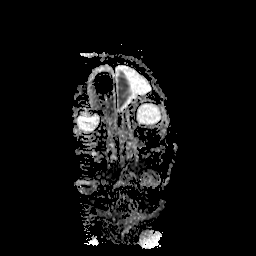
[im 33/33]
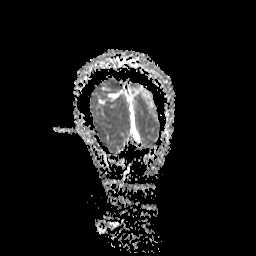

[28 of 48 positions shown; findings below may reference images not displayed]

FINDINGS: MRI HEAD FINDINGS

Brain: Left side subdural hematoma measures up to 9 mm thickness now
on coronal images, previously 6-7 mm). Unchanged signal within the
left subdural blood products, mildly heterogeneous FLAIR signal
within the left subdural hematoma. Trace right subdural blood mostly
along the parietal and occipital lobes appears decreased. Rightward
midline shift has increased to 7 mm (previously 6 mm).

No ventriculomegaly. And no significant intraventricular hemorrhage
despite continued basilar cistern predominant subarachnoid blood
which is best demonstrated on FLAIR and DWI.

Scattered small foci of restricted diffusion are redemonstrated in
the right hemisphere were new and/or increased cortical restricted
diffusion in the posterior right frontal and right parietal lobes
near the perirolandic cortex on series 3, image 45 today. Compare to
series 5, image 97 previously. Conspicuous restricted diffusion
along the ventral right thalamus and right cauda thalamic groove has
not significantly changed.

No convincing abnormal diffusion in the left hemisphere or posterior
fossa, and no other areas of significantly progressed diffusion
abnormality.

Associated gyriform cytotoxic edema in the right perirolandic
cortex. No malignant hemorrhagic transformation identified.

Negative cervicomedullary junction and pituitary.

Vascular: Major intracranial vascular flow voids appears stable.

Skull and upper cervical spine: Negative visible cervical spine,
bone marrow signal.

Sinuses/Orbits: Negative orbits. Stable mild paranasal sinus mucosal
thickening.

Other: Stable trace mastoid fluid.

MRA HEAD FINDINGS

Intracranial MRA today is more motion degraded in addition to some
vessel obscuration due to the T1 intrinsic subarachnoid blood.

Antegrade flow appears stable in the distal vertebral arteries and
basilar. No convincing vertebrobasilar stenosis. As before fetal
type PCA origins are suspected, more so the left. Largely obscured
PCA branch detail today.

Antegrade flow in both ICA siphons appears stable. Both carotid
termini appear patent. The right ACA A1 appears to be dominant as
before. Both MCA M1 segments are patent. But bilateral MCA and ACA
branch detail is largely obscured today.
IMPRESSION: 1. Mildly increased Left Subdural Hematoma since [DATE], now up
to 9 mm in thickness. Smaller right side subdural hematoma has
regressed, now trace.

2. Associated mildly increased rightward midline shift, now 7 mm.

3. Stable moderate volume basilar cistern predominant Subarachnoid
Hemorrhage with no significant intraventricular hemorrhage and no
ventriculomegaly.

4. Increased right hemisphere superior peri-Rolandic infarcts since
[DATE]. Cytotoxic edema with no associated parenchymal
hemorrhage or mass effect.

5. Otherwise stable multifocal right hemisphere ischemia, including
at the right caudothalamic groove.

6. Intracranial MRA is more motion degraded today, but flow within
the large vessels appear stable since [DATE]. Largely obscured
second order and distal branches today.

## 2020-01-22 IMAGING — DX DG CHEST 1V PORT
1 series · 1 of 1 positions shown · non-contrast
Comparison: [DATE]

CLINICAL DATA: Hypoxia/respiratory failure

EXAM:
PORTABLE CHEST 1 VIEW

[chest ap]
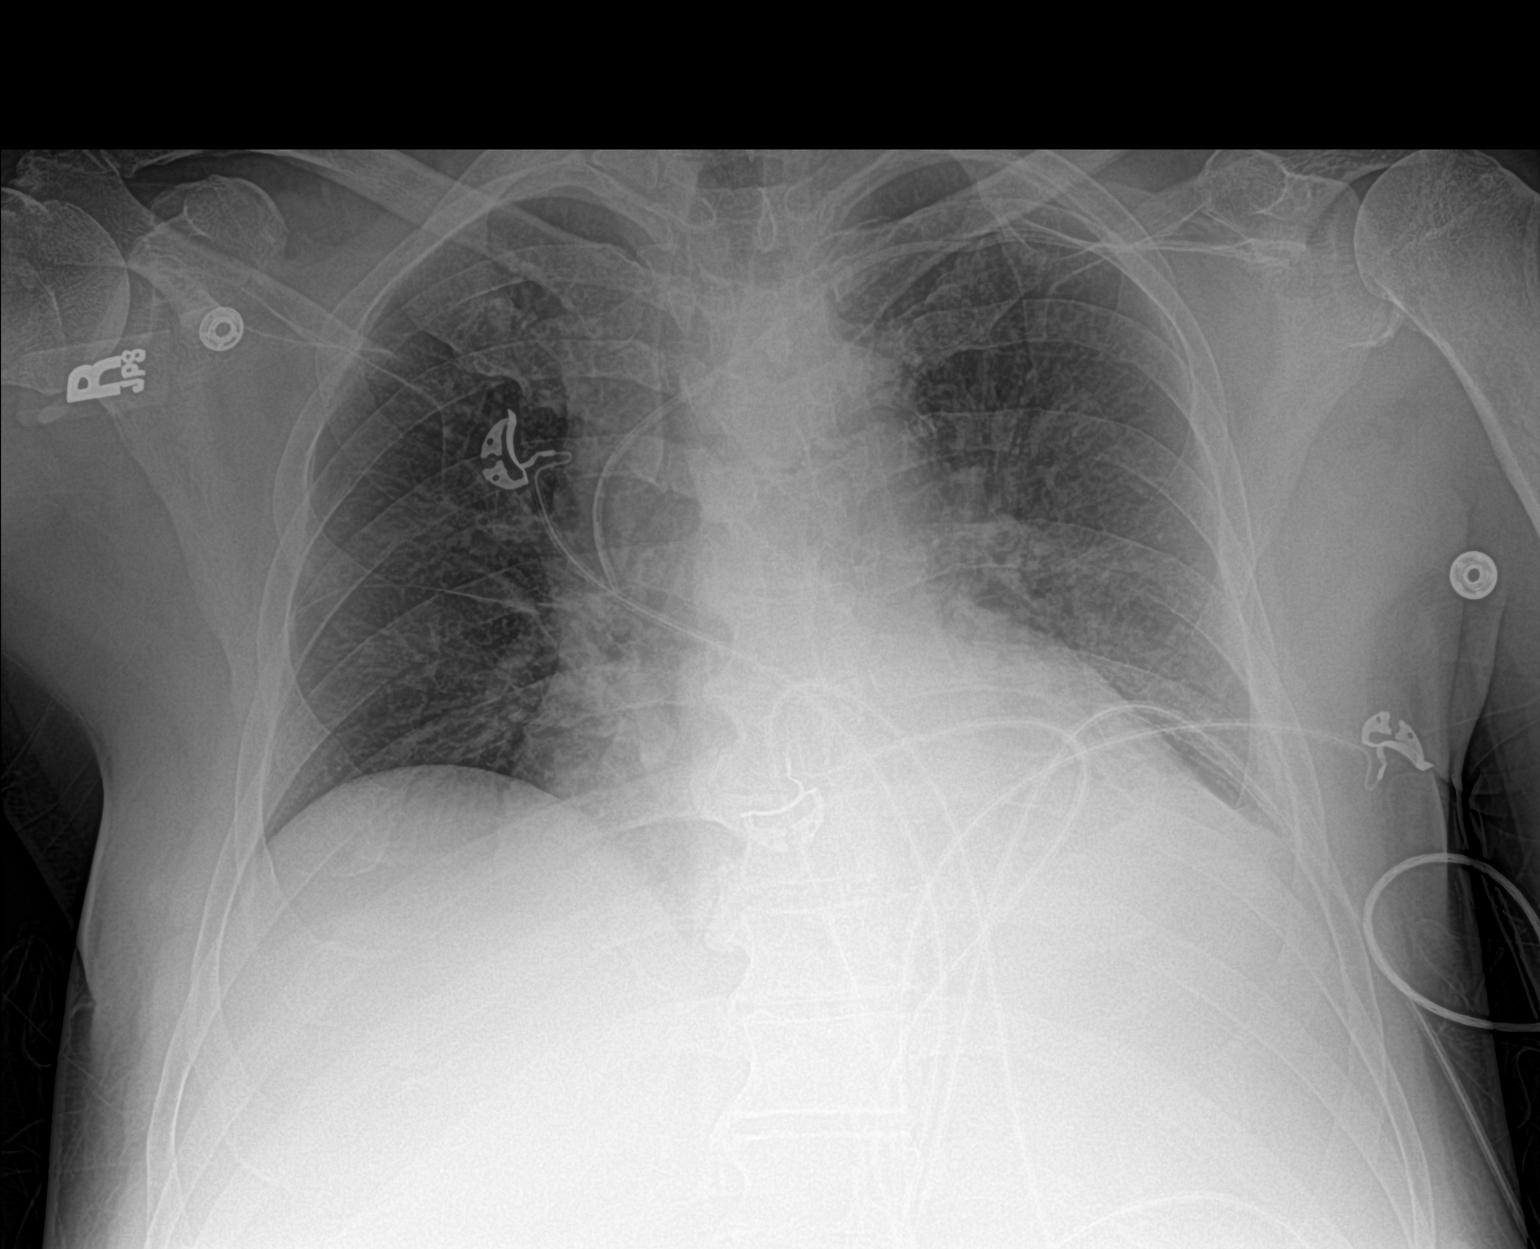

[1 of 1 positions shown; findings below may reference images not displayed]

FINDINGS: There is mild left base atelectasis. The lungs elsewhere are clear.
There is cardiomegaly with pulmonary vascularity normal. No
adenopathy. There is degenerative change in the thoracic spine.
IMPRESSION: Slight left base atelectasis. Lungs elsewhere clear. Stable cardiac
prominence. No adenopathy evident.

## 2020-01-22 MED ORDER — FENTANYL CITRATE (PF) 100 MCG/2ML IJ SOLN
25.0000 ug | INTRAMUSCULAR | Status: AC
  Administered 2020-01-22: 25 ug via INTRAVENOUS
  Filled 2020-01-22: qty 2

## 2020-01-22 MED ORDER — NIMODIPINE 30 MG PO CAPS
30.0000 mg | ORAL_CAPSULE | ORAL | Status: DC
  Administered 2020-01-24: 30 mg via ORAL
  Filled 2020-01-22 (×3): qty 1

## 2020-01-22 MED ORDER — MAGNESIUM SULFATE 4 GM/100ML IV SOLN
4.0000 g | Freq: Once | INTRAVENOUS | Status: AC
  Administered 2020-01-22: 4 g via INTRAVENOUS
  Filled 2020-01-22 (×2): qty 100

## 2020-01-22 MED ORDER — NIMODIPINE 6 MG/ML PO SOLN
30.0000 mg | ORAL | Status: DC
  Administered 2020-01-22 – 2020-01-31 (×104): 30 mg via ORAL
  Filled 2020-01-22 (×70): qty 10

## 2020-01-22 MED ORDER — LEVETIRACETAM 100 MG/ML PO SOLN
500.0000 mg | Freq: Two times a day (BID) | ORAL | Status: DC
  Administered 2020-01-22 – 2020-02-02 (×23): 500 mg via ORAL
  Filled 2020-01-22 (×23): qty 5

## 2020-01-22 NOTE — Progress Notes (Signed)
PULMONARY / CRITICAL CARE MEDICINE   NAME:  Nicholas [name], MRN:  [id number], DOB:  1950/11/15, LOS: 8 ADMISSION DATE:  01/14/2020, CONSULTATION DATE:  01/14/20 REFERRING MD: ED Physician, CHIEF COMPLAINT:  SAH  BRIEF HISTORY:    69 yo male who presented for 24 hours of progressive headache and nuchal rigidity with associated nausea, vomiting and confusion. Patient was transported by EMS to [facility] for imaging and transferred to [facility] for further evaluation.  Both CTA and catheter angiography negative for aneurysm due to presence of vasospasm. Severe symptomatic 3 vessel vasospasm and has responded reasonably well to chemical/mechanical angioplasty and hyperdynamic treatment.  SIGNIFICANT PAST MEDICAL HISTORY   He  has a past medical history of BPH (benign prostatic hypertrophy) (12/20/2011), Colon polyp, Deviated septum, Diabetes mellitus without complication (HCC), Hepatitis A, HSV-1 (herpes simplex virus 1) infection, Hyperlipidemia, Hypertension, and Migraine headache.  SIGNIFICANT EVENTS:  CVL 4/30 A-line 4/30 Diagnostic cerebral angiogram with balloon angioplasty 4/30  STUDIES:   Echo 4/27: LVEF 70-75%, grade I diastolic dysfunction TCD 5/1: Improvement in vasospasm overall but persistent elevated right middle  cerebral and left carotid siphon mean flow velocities suggestive of mild  vasospasm.  MRA head 5/1: Multiple small acute infarcts the right cerebral hemisphere primarily involving the anterior greater than posterior circulations. Thin left larger than right subdural collections with stable mild rightward midline shift. Residual subarachnoid hemorrhage likely similar to prior CT. TCD 5/3: Mildly elevated right middlle crebral and bilateral carotid siphon mean  flow velocities indicate mild vasospasm. Normal mean flow velocities in  remaining identified vessels of anterior and posterior cerebral circulations.Globally elevated pulsatility indices indicate increase in  intracranial pressure likely.   CULTURES:  COVID 4/26 > negative MRSA PCR 4/26 > negative  Blood culture 5/2: NGTD Urine culture 4/26: NGTD  ANTIBIOTICS:  None  LINES/TUBES:  CVC triple lumen catheter (left subclavian) 01/18/20 Peripheral IV left wrist 01/18/20 Foley urinary catheter  CONSULTANTS:  Neurology, neurosurgery  SUBJECTIVE:  5/3: Pt is lying in bed in no acute distress, he is alert and oriented x3. Denies any acute complaints. Slowed responses but appropriate.  5/4: No acute events overnight. Pt is lying comfortably in bed in no acute distress. Oriented to self, place, time with delayed responses. Remains on two vasopressors for escalated map goal per neuro/neurosx  CONSTITUTIONAL: BP (!) 206/97   Pulse 83   Temp 99.5 F (37.5 C) (Axillary)   Resp (!) 21   Ht 6' (1.829 m)   Wt 96.5 kg   SpO2 98%   BMI 28.85 kg/m   I/O last 3 completed shifts: In: 9299 [P.O.:1800; I.V.:7125.7; IV Piggyback:373.4] Out: 8325 [Urine:8300; Stool:25]  CVP:  [5 mmHg-30 mmHg] 7 mmHg     PHYSICAL EXAM: General: Pleasant male lying in bed, NAD  HEENT: [place]/AT, MM pink/moist, PERRL, able to actively turn neck from right side with preference to midline Neuro: Alert and oriented x3 but delayed responses, 2-3/5 strength LLE, 3/5 strength LUE with decent grip. Mild left facial droop CV: regular rate and rhythm, no murmur, rubs, or gallops,  PULM:  Clear to ascultation bilaterally, no increased work of breathing GI: soft, bowel sounds active in all 4 quadrants, non-tender, non-distended Extremities: warm/dry, no edema  Skin: no rashes or lesions  IMAGING  Transcranial Doppler 01/22/2020 Summary:  Mildly elevated right middlle crebral and bilateral carotid siphon mean  flow velocities indicate mild vasospasm. Normal mean flow velocities in  remaining identified vessels of anterior and posterior cerebral circulations.Globally elevated  pulsatility indices indicate increase in  intracranial pressure likely.   CT ANGIO HEAD W OR WO CONTRAST 01/18/2020 IMPRESSION:   Overall decreased volume of subarachnoid hemorrhage since 01/15/2020.   New left subdural hygroma and subcentimeter rightward midline shift.   Resolution of hydrocephalus.   Further significantly diminished flow within the right ICA with no enhancement visualized at and beyond the cavernous segment.   Mild increased right M1 MCA narrowing and diminished distal right MCA branch filling.   New narrowing of the left A1 ACA with severe stenosis near the origin.   Persistent irregularity of the distal basilar artery with new superimposed severe stenosis.   IR PTA Vasospasm Initial IR PTA Vasospasm Add Diff IR ENDOVASC INTRACRANIAL INF OTHER THAN THROMBO ART INC DIAG ANGIO IR ENDOVASC INTRACRANIAL INF OTHER THAN THROMBO ART INC DIAG ANGIO EA ADD 01/18/2020 IMPRESSION:   Severe vasospasm involving the supraclinoid segments of the internal carotid arteries bilaterally and the length of the basilar artery.   There is significant improvement in vessel caliber and distal territory perfusion after balloon angioplasty of the right internal carotid artery, right middle cerebral artery, and basilar artery.   MRI / MRA Head/ Brain 01/19/2020 IMPRESSION:  Multiple small acute infarcts the right cerebral hemisphere primarily involving the anterior greater than posterior circulations.  Thin left larger than right subdural collections with stable mild rightward midline shift.  Residual subarachnoid hemorrhage likely similar to prior CT.  Suboptimal vascular evaluation due to intrinsic T1 hyperintensity of hemorrhage. Improved flow within the intracranial right ICA compared to 01/18/2020 CTA.   Appearance is now similar to 01/14/2020 CTA with persistent severe stenosis of the supraclinoid portion. Likely persistent left A1 ACA stenosis and distal basilar stenosis.  DG CHEST PORT 1  VIEW 01/18/2020 IMPRESSION:  1. Left subclavian approach central line placed, tip at the lower SVC level.  2. No pneumothorax or acute cardiopulmonary abnormality.  DG CHEST PORT 1 VIEW 01/18/2020 IMPRESSION:  1.  Cardiomegaly.  No pulmonary venous congestion.  2.  Low lung volumes.  Mild bibasilar infiltrates.   DG Chest Port 1 View 12/09/19 IMPRESSION: Cardiomegaly without acute abnormality of the lungs in AP portable projection.  Transthoracic Echocardiogram  01/15/2020 IMPRESSIONS  1. Normal LV systolic function; proximal septal thickening; grade 1  diastolic dysfunction; mildly dilated aortic root; scerotic aortic valve.  2. Left ventricular ejection fraction, by estimation, is 70 to 75%. The  left ventricle has hyperdynamic function. The left ventricle has no  regional wall motion abnormalities. There is mild left ventricular  hypertrophy of the basal segment. Left  ventricular diastolic parameters are consistent with Grade I diastolic  dysfunction (impaired relaxation).  3. Right ventricular systolic function is normal. The right ventricular  size is normal.  4. The mitral valve is normal in structure. No evidence of mitral valve  regurgitation. No evidence of mitral stenosis.  5. The aortic valve is tricuspid. Aortic valve regurgitation is not  visualized. Mild to moderate aortic valve sclerosis/calcification is  present, without any evidence of aortic stenosis.  6. Aortic dilatation noted. There is mild dilatation of the aortic root  measuring 38 mm.  7. The inferior vena cava is normal in size with greater than 50%  respiratory variability, suggesting right atrial pressure of 3 mmHg.   Transcranial Doppler 01/16/2020 Summary:  Elevated right middle cerebral and carotid siphon and left middle cerebral artery and carotid siphon mean flow velocities suggest mild vasospasm.  Slight improvement compared with prior TCD study 01/14/20  RESOLVED PROBLEM LIST   None ASSESSMENT AND PLAN   Mr. Purtee is a 69 yo with a history of hypertension, migraines, HSV-1, diabetes, HLD, hepatitis A, and BPH who was transported from [facility] after suddenly developing a severe headache and nuchal rigidity with associated left hemiplegia. CTA revealed large SAH in the basilar cisterns extending into bilateral sylvian fissures. 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 He experienced clinical vasospasm of basilar and right ICA s/p angioplasty. He is currently on [facility] treatment.  #SAH with vasospasm -management per neurology -TCD yesterday revealed mild vasospasm with globally elevated pulsatility indices indicating increase in intracranial pressure likely.  -for MRA/MRI today to eval vasospasm -continue [place] treatment -hypertension: goal SBP 180-220. On Neo, vasopressin. D/c'd Levophed 5/2 d/t ectopy likely associated with increased adrenergic tone and electrolyte abnormalities. -Nimodipine 30mg  q2h. -hypervolemia: completed albumin 4 doses. IVF NS @ 125 mL/hr -hemodilution -continue frequent neuro checks. Maintain neuro protective measures including goal for normoxia, euglycemia, eunatremia, and eurothermia.  #Low grade fevers -most likely due to atelectasis vs neurogenic fever associated with SAH. Neurogenic fever can result from a disruption in the hypothalamic set point temperature -> abnormal increase in body temperature associated with injury to hypothalamus. -WBC 20.5 (5/3) ->20.2 (5/4). Continue to trend with daily CBC. -Procal level 0.27 not suggestive of infectious etiology. -encourage pulm hygiene -tmax 99.7 -cxr this am revealed slight left base atelectasis, otherwise unremarkable. -cont to hold on abx for now.   #Prolonged QT interval -most likely d/t electrolyte derangements. Received mag and K+ repletion yesterday -mag 1.9 (5/3), K+ 3.7 (5/4). Goals Mag >2 and K >4.  Mag and K+ given yesterday.  -will repeat BMP, mag tomorrow am and replace again if necessary. Will also order phos for tomorrow am. -ekg in am as well to follow   #Glycemic control - CBC monitoring q4h with SSI coverage - CBGs better controlled yesterday with increase in Levemir (avg 160's). Continue NovoLOG 10u TID and Levemir to 35u BID.   SUMMARY OF TODAY'S PLAN:  Repeat BMP, mag, and CBC with diff tomorrow am. Will also order phos level. Continue to hold abx for now given normal procal level and afebrile.  Best Practice / Goals of Care / Disposition.   Diet: diabetic Pain/Anxiety/Delirium protocol (if indicated): PRNs VAP protocol (if indicated): Pulmonary hygiene  DVT prophylaxis: SCD GI prophylaxis:PPI Glucose control: see above Mobility: Bedrest Code Status: Full FAMILY DISCUSSIONS: Per primary DISPOSITION: continue Neuro ICU care  LABS  Glucose Recent Labs  Lab 01/21/20 1103 01/21/20 1557 01/21/20 1916 01/21/20 2303 01/22/20 0302 01/22/20 0754  GLUCAP 270* 189* 164* 128* 149* 215*    BMET Recent Labs  Lab 01/21/20 0535 01/21/20 1515 01/22/20 0532  NA 138 135 137  K 3.5 3.4* 3.7  CL 102 99 102  CO2 26 25 27   BUN 17 21 23   CREATININE 0.82 0.80 0.78  GLUCOSE 254* 186* 167*    Liver Enzymes No results for input(s): AST, ALT, ALKPHOS, BILITOT, ALBUMIN in the last 168 hours.  Electrolytes Recent Labs  Lab 01/16/20 0416 01/16/20 0416 01/17/20 0250 01/19/20 0456 01/20/20 0110 01/20/20 0201 01/21/20 0535 01/21/20 1515 01/22/20 0532  CALCIUM 8.9   < > 8.3*   < >   < >  --  8.5* 8.3* 8.4*  MG 2.1   < > 1.8  --   --  1.9  --  1.9  --  PHOS 3.6  --  2.8  --   --  1.2*  --   --   --    < > = values in this interval not displayed.    CBC Recent Labs  Lab 01/20/20 0110 01/21/20 0535 01/22/20 0532  WBC 18.5* 20.5* 20.2*  HGB 13.2 15.5 16.5  HCT 40.6 47.3 49.3  PLT 218 214 164    ABG No results for input(s): PHART, PCO2ART, PO2ART in the  last 168 hours.  Coag's No results for input(s): APTT, INR in the last 168 hours.  Sepsis Markers Recent Labs  Lab 01/21/20 1108 01/22/20 0532  PROCALCITON 0.27 0.27    Cardiac Enzymes No results for input(s): TROPONINI, PROBNP in the last 168 hours.  Critical care time: The patient is critically ill with multiple organ systems failure and requires high complexity decision making for assessment and support, frequent evaluation and titration of therapies, application of advanced monitoring technologies and extensive interpretation of multiple databases.  Critical care time 35 mins. This represents my time independent of the NP's/resident/medicalstudents time taking care of the pt. This is excluding procedures.    Briant Sites DO [facility] 01/22/2020, 11:35 AM

## 2020-01-22 NOTE — Progress Notes (Signed)
Inpatient Diabetes Program Recommendations  AACE/ADA: New Consensus Statement on Inpatient Glycemic Control   Target Ranges:  Prepandial:   less than 140 mg/dL      Peak postprandial:   less than 180 mg/dL (1-2 hours)      Critically ill patients:  140 - 180 mg/dL  Results for Nicholas [name], Nicholas [name] (MRN [id number]) as of 01/22/2020 11:32  Ref. Range 01/22/2020 03:02 01/22/2020 07:54 01/22/2020 11:16  Glucose-Capillary Latest Ref Range: 70 - 99 mg/dL 527 (H)  Novolog 2 units 215 (H)  Novolog 18 units       Levemir 35 units   Results for Nicholas [name], Nicholas [name] (MRN [id number]) as of 01/22/2020 11:32  Ref. Range 01/21/2020 07:24 01/21/2020 10:22 01/21/2020 11:03 01/21/2020 15:57 01/21/2020 19:16 01/21/2020 22:38 01/21/2020 23:03  Glucose-Capillary Latest Ref Range: 70 - 99 mg/dL 144 (H)  Novolog 18 units        Levemir 30 units 270 (H)  Novolog 22 units 189 (H)  Novolog 14 units 164 (H)  Novolog 4 units        Levemir 35 units 128 (H)  Novolog 2 units   Review of Glycemic Control   Current orders for Inpatient glycemic control: Levemir 35 units BID, Novolog 0-24 units Q4H, Novolog 10 units TID with meals  Inpatient Diabetes Program Recommendations:   Insulin-Basal: Noted Levemir increased to 35 units BID on 01/21/20.  Insulin-Meal Coverage: If post prandial glucose is over 180 mg/dl today, please consider increasing meal coverage to Novolog 14 units TID with meals.  Thanks, Orlando Penner, RN, MSN, CDE Diabetes Coordinator Inpatient Diabetes Program [phone] (Team Pager from 8am to 5pm)

## 2020-01-22 NOTE — Evaluation (Signed)
Speech Language Pathology Evaluation Patient Details Name: Nicholas [name] MRN: [id number] DOB: 06/29/1951 Today's Date: 01/22/2020 Time: 4098-1191 SLP Time Calculation (min) (ACUTE ONLY): 13 min  Problem List:  Patient Active Problem List   Diagnosis Date Noted  . Acute respiratory failure with hypoxia (HCC)   . Aneurysm (HCC)   . Subarachnoid hemorrhage (HCC) 01/14/2020  . SAH (subarachnoid hemorrhage) (HCC) 01/14/2020  . Other and unspecified hyperlipidemia 07/17/2013  . Need for prophylactic vaccination and inoculation against influenza 07/17/2013  . Type II or unspecified type diabetes mellitus without mention of complication, not stated as uncontrolled 04/12/2013  . 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 Elevated transaminase measurement 04/12/2013  . Type II or unspecified type diabetes mellitus without mention of complication, uncontrolled 01/05/2013  . Essential hypertension, benign 01/05/2013  . BPH (benign prostatic hyperplasia) 01/05/2013  . Overweight(278.02) 01/05/2013   Past Medical History:  Past Medical History:  Diagnosis Date  . BPH (benign prostatic hypertrophy) 12/20/2011   Dr. Cleatrice Burke  . Colon polyp    Dr. Vernell Barrier, it was recommended that he have a follow-up colonoscopy in 2010.  Marland Kitchen Deviated septum   . Diabetes mellitus without complication [facility])    Eye exam - 2011  . Hepatitis A   . HSV-1 (herpes simplex virus 1) infection   . Hyperlipidemia   . Hypertension   . Migraine headache    Past Surgical History:  Past Surgical History:  Procedure Laterality Date  . IR ANGIO EXTERNAL CAROTID SEL EXT CAROTID BILAT MOD SED  01/15/2020  . IR ANGIO INTRA EXTRACRAN SEL INTERNAL CAROTID BILAT MOD SED  01/15/2020  . IR ANGIO VERTEBRAL SEL VERTEBRAL BILAT MOD SED  01/15/2020  . IR ENDOVASC INTRACRANIAL INF OTHER THAN THROMBO ART INC DIAG ANGIO  01/18/2020  . IR ENDOVASC INTRACRANIAL INF OTHER THAN THROMBO ART INC DIAG ANGIO EA ADD  01/18/2020  . IR PTA VASOSPASM ADD DIFF  01/18/2020   . IR PTA VASOSPASM INITIAL  01/18/2020  . NASAL SEPTUM SURGERY    . RADIOLOGY WITH ANESTHESIA N/A 01/15/2020   Procedure: IR WITH ANESTHESIA;  Surgeon: Lisbeth Renshaw, MD;  Location: [facility] OR;  Service: Radiology;  Laterality: N/A;  . RADIOLOGY WITH ANESTHESIA N/A 01/18/2020   Procedure: IR WITH ANESTHESIA;  Surgeon: Lisbeth Renshaw, MD;  Location: [facility] OR;  Service: Radiology;  Laterality: N/A;   HPI:  Pt is a 69 year old male presented to the ED today for persistent headaches that have gradually worsened since yesterday evening. CT head 4/26: Large volume subarachnoid hemorrhage, right greater than left. Possible right tentorial subdural hemorrhage versus subarachnoid hemorrhage. [name] on 4/27. 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   Assessment / Plan / Recommendation Clinical Impression  Pt has decreased awareness and processing skills, not able to identify any left-sided weakness even when given cues. His gaze preference is to the right. Pt does not communicate much spontaneously but will respond to questioning given delays for processing. He cannot solve simple math calculations and his storage of new information with immediate repetition is limited, making delayed recall very challenging. He needed Min cues from RN to identify who lives at home with him. 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    SLP Assessment  SLP Recommendation/Assessment: Patient needs  continued Speech [place] [facility] SLP Visit Diagnosis: Cognitive communication deficit (R41.841)    Follow Up Recommendations  24 hour supervision/assistance;Other (comment)(SLP f/u at next level of care)    Frequency and Duration min 2x/week  2  weeks      SLP Evaluation Cognition  Overall Cognitive Status: Impaired/Different from baseline Arousal/Alertness: Awake/alert Orientation Level: Oriented X4 Attention: Selective Selective Attention: Impaired Selective Attention Impairment: Verbal basic Memory: Impaired Memory Impairment: Storage deficit;Retrieval deficit;Decreased recall of new information Awareness: Impaired Awareness Impairment: Intellectual impairment;Emergent impairment Problem Solving: Impaired Problem Solving Impairment: Verbal complex Executive Function: Self Monitoring Self Monitoring: Impaired Self Monitoring Impairment: Functional basic Safety/Judgment: Impaired Comments: slow processing       Comprehension  Auditory Comprehension Overall Auditory Comprehension: Appears within functional limits for tasks assessed(simple)    Expression Expression Primary Mode of Expression: Verbal Verbal Expression Overall Verbal Expression: (limited verbal output but what he says is functional)   Oral / Motor  Motor Speech Overall Motor Speech: Appears within functional limits for tasks assessed   GO                     Osie Bond., M.A. [place] [facility] Services Pager [phone] Office [phone]  01/22/2020, 10:10 AM

## 2020-01-22 NOTE — Progress Notes (Signed)
  NEUROSURGERY PROGRESS NOTE   No issues overnight. Pt without complaint this am.  EXAM:  BP (!) 177/100   Pulse (!) 103   Temp 99.5 F (37.5 C) (Axillary)   Resp 20   Ht 6' (1.829 m)   Wt 96.5 kg   SpO2 98%   BMI 28.85 kg/m   Awake, alert, oriented  Speech fluent, appropriate, dysarthric Left facial droop Moving RUE/RLE well No voluntary movement of LUE/LLE. RN noted able to wiggle toes to command earlier  IMPRESSION:  69 y.o. male SAH d# 8 with slightly worsened dense left hemiparesis related to severe vasospasm  PLAN: - Cont to maintain hypertension SBP > and IVF hydration - Cont Nimotop - I have spoken with Dr. Roda Shutters, will get repeat MRI/MRA today. If there is worsened spasm compared to last MRA over weekend without large completed stroke would consider repeating angiogram and angioplasty. If on the other hand there is no significant change on MRI/MRA will likely continue with medical treatment.  I have reviewed the plan above with the patient's son at bedside. All questions today were answered.

## 2020-01-22 NOTE — Progress Notes (Signed)
Pt taken to MRI at 1340. Placed to MRI monitor and monitored throughout. See flowsheets for VS. Pt remained on Neo at 400 mcg/min, Vasopressin at 0.04 u/min, and NS at 125 on MRI IV pumps. MRI/MRA completed and pt returned to ICU. Tolerated transport well. Neuro exam unchanged. Dr. Conchita Paris notified of completed scan.

## 2020-01-22 NOTE — Progress Notes (Addendum)
Spoke with on-call Audree Bane NP with concerns about pt fluctuating BP and possibility of pt still with vasospasm; also concerns about restarting Levophed order regarding pt frequent ectopy and having new onset ectopy over the past few days with the start of Levo previously. After discussion, determined pt does need the hypertension, will restart Levo with caution monitoring pt HR and rhythm. Also discussed the possible need for an  A-line for better BP monitoring.

## 2020-01-22 NOTE — Progress Notes (Signed)
Occupational Therapy Re- Evaluation Patient Details Name: Nicholas [name] MRN: [id number] DOB: [birth year] Today's Date: 01/22/2020    History of Present Illness 69 year old male presented to the ED today for persistent headaches that have gradually worsened since yesterday evening accompanied by nausea and vomiting. Spouse acknowledges some confusion is present. CTA demonstrating a large SAH in the basilar cisterns extending into bilateral sylvian fissures. Pt underwent cerebral angiogram - no evidence of aneurysm, however, noted with vasospasm.  on 4/30, he developed Lt hemiparesis with Rt gaze preference.  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  PMH includes: HTN, hepatitis A, DM, BPH, HTN, migraine headaches   Clinical Impression   Pt seen in conjunction with PT for re-evaluation.  Pt now demonstrates Lt neglect with Rt gaze preference, impaired balance, Lt hemiparesis, generalized weakness, and impaired cognition.  He now requires mod - total A for ADLs, and mod A +2 for bed mobility.  He has had a significant change in status, therefore treatment plan, plan of care, and goals updated accordingly.  Recommend CIR level rehab at discharge.     Follow Up Recommendations  CIR;Supervision/Assistance - 24 hour    Equipment Recommendations  None recommended by OT    Recommendations for Other Services Rehab consult     Precautions / Restrictions Precautions Precautions: Fall Precaution Comments: SBP 180-220; Lt hemiparesis with Lt neglect       Mobility Bed Mobility Overal bed mobility: Needs Assistance Bed Mobility: Supine to Sit;Sit to Supine     Supine to sit: Max assist;+2 for physical assistance;+2 for safety/equipment Sit to supine: Max assist;+2 for physical assistance;+2 for safety/equipment   General bed mobility comments: Pt requires assist to move LEs off and onto  the bed and assist to lift and lower his trunk.  He is able to assist with pulling trunk upright   Transfers                 General transfer comment: Unable to safely attempt this date     Balance Overall balance assessment: Needs assistance Sitting-balance support: Feet supported;Single extremity supported Sitting balance-Leahy Scale: Poor Sitting balance - Comments: Pt initially required mod A to maintain EOB sitting, but progressed to min A.  He was able to activate Lt trunk after working on rotation of trunk to the Lt  Postural control: Left lateral lean                                 ADL either performed or assessed with clinical judgement   ADL Overall ADL's : Needs assistance/impaired Eating/Feeding: NPO;Sitting   Grooming: Wash/dry hands;Wash/dry face;Moderate assistance;Sitting;Brushing hair Grooming Details (indicate cue type and reason): assist for sitting balance, and to attend to Lt side of head and face  Upper Body Bathing: Maximal assistance;Sitting   Lower Body Bathing: Total assistance;Bed level   Upper Body Dressing : Total assistance;Sitting   Lower Body Dressing: Total assistance;Bed level   Toilet Transfer: Total assistance Toilet Transfer Details (indicate cue type and reason): unable to attempt  Toileting- Clothing Manipulation and Hygiene: Total assistance;Bed level       Functional mobility during ADLs: Maximal assistance;+2 for physical assistance(bed mobility ) General ADL Comments: Pt requires assist for sitting balance, trunk control, cues/assist for neglect      Vision Baseline Vision/History: Wears glasses  Wears Glasses: At all times Patient Visual Report: Other (comment) Additional Comments: Pt demonstrates Rt gaze preference.  When distraction reduced on the Rt, he will track 3/4 of the way to the Lt with max cues.  He will locate items in his Lt periphery      Perception Perception Perception Tested?:  Yes Perception Deficits: Inattention/neglect Inattention/Neglect: Does not attend to left visual field;Does not attend to left side of body Spatial deficits: Lt neglect    Praxis Praxis Praxis tested?: Within functional limits    Pertinent Vitals/Pain Pain Assessment: Faces Faces Pain Scale: Hurts little more Pain Location: back with transitional movements  Pain Descriptors / Indicators: Discomfort Pain Intervention(s): Monitored during session     Hand Dominance Right   Extremity/Trunk Assessment Upper Extremity Assessment Upper Extremity Assessment: LUE deficits/detail LUE Deficits / Details: Pt does not spontaneously attempt to use Lt UE.  with max prompting to move or lift Lt UE, he will demonstrate trace activation.   LUE Sensation: decreased light touch;decreased proprioception LUE Coordination: decreased fine motor;decreased gross motor   Lower Extremity Assessment Lower Extremity Assessment: Defer to PT evaluation   Cervical / Trunk Assessment Cervical / Trunk Assessment: Other exceptions Cervical / Trunk Exceptions: Pt maintains posterior pelvic tilt with thoracic  flexion.  Lt weakness noted.    Communication Communication Communication: No difficulties   Cognition Arousal/Alertness: Awake/alert Behavior During Therapy: Flat affect Overall Cognitive Status: Impaired/Different from baseline Area of Impairment: Attention;Memory;Following commands;Awareness;Problem solving                   Current Attention Level: Focused;Sustained Memory: Decreased short-term memory Following Commands: Follows one step commands inconsistently;Follows one step commands with increased time   Awareness: Intellectual Problem Solving: Slow processing;Decreased initiation;Difficulty sequencing;Requires verbal cues     General Comments  BP within parameters.  MD and RN present during session     Exercises     Shoulder Instructions      Home Living Family/patient  expects to be discharged to:: Private residence Living Arrangements: Spouse/significant other Available Help at Discharge: Family;Available 24 hours/day Type of Home: House Home Access: Stairs to enter [employer] of Steps: 3 Entrance Stairs-Rails: Right Home Layout: One level     Bathroom Shower/Tub: Teacher, early years/pre: Standard     Home Equipment: Environmental consultant - 2 wheels;Crutches;Bedside commode          Prior Functioning/Environment Level of Independence: Independent        Comments: working as Radiation protection practitioner Problem List: Decreased strength;Decreased range of motion;Decreased activity tolerance;Impaired balance (sitting and/or standing);Impaired vision/perception;Decreased coordination;Decreased cognition;Decreased safety awareness;Decreased knowledge of use of DME or AE;Cardiopulmonary status limiting activity;Impaired sensation;Impaired tone;Impaired UE functional use;Pain      OT Treatment/Interventions: Self-care/ADL training;Neuromuscular education;DME and/or AE instruction;Splinting;Therapeutic activities;Cognitive remediation/compensation;Visual/perceptual remediation/compensation;Patient/family education;Balance training    OT Goals(Current goals can be found in the care plan section) Acute Rehab OT Goals Patient Stated Goal: to be able to regain independence  OT Goal Formulation: With patient/family Time For Goal Achievement: 02/05/20 Potential to Achieve Goals: Good ADL Goals Pt Will Perform Grooming: with min assist;sitting Pt Will Perform Lower Body Bathing: with mod assist;sit to/from stand Pt Will Transfer to Toilet: with mod assist;stand pivot transfer;bedside commode Pt Will Perform Toileting - Clothing Manipulation and hygiene: with max assist;sit to/from stand  OT Frequency: Min 2X/week   Barriers to D/C: Decreased caregiver support  family currently unable to provide current level of assist  Co-evaluation  PT/OT/SLP Co-Evaluation/Treatment: Yes Reason for Co-Treatment: Complexity of the patient's impairments (multi-system involvement);Necessary to address cognition/behavior during functional activity;For patient/therapist safety;To address functional/ADL transfers   OT goals addressed during session: ADL's and self-care;Strengthening/ROM      AM-PAC OT "6 Clicks" Daily Activity     Outcome Measure Help from another person eating meals?: Total Help from another person taking care of personal grooming?: A Lot Help from another person toileting, which includes using toliet, bedpan, or urinal?: Total Help from another person bathing (including washing, rinsing, drying)?: A Lot Help from another person to put on and taking off regular upper body clothing?: Total Help from another person to put on and taking off regular lower body clothing?: Total 6 Click Score: 8   End of Session Nurse Communication: Mobility status  Activity Tolerance: Patient tolerated treatment well Patient left: in bed;with call bell/phone within reach;with family/visitor present;with nursing/sitter in room  OT Visit Diagnosis: Hemiplegia and hemiparesis;Cognitive communication deficit (R41.841) Symptoms and signs involving cognitive functions: Cerebral infarction Hemiplegia - Right/Left: Left Hemiplegia - dominant/non-dominant: Non-Dominant Hemiplegia - caused by: Cerebral infarction                Time: 9628-3662 OT Time Calculation (min): 55 min Charges:  OT General Charges $OT Visit: 1 Visit OT Evaluation $OT Eval Moderate Complexity: 1 Mod OT Treatments $Neuromuscular Re-education: 8-22 mins  Eber Jones., OTR/L Acute Rehabilitation Services Pager [phone] Office [phone]   Jeani Hawking M 01/22/2020, 1:19 PM

## 2020-01-22 NOTE — Progress Notes (Signed)
  Speech Language Pathology Treatment: Dysphagia  Patient Details Name: Nicholas [name] MRN: [id number] DOB: [birth year] Today's Date: 01/22/2020 Time: 0850-0900 SLP Time Calculation (min) (ACUTE ONLY): 10 min  Assessment / Plan / Recommendation Clinical Impression  Pt consumed breakfast meal with feeding assistance with no overt s/s of aspiration and no additional cues needed for use of aspiration precautions. Recommend continuing regular solids and thin liquids. No SLP f/u needed for dysphagia, but see SLP cognitive-linguistic evaluation for further recommendations.   HPI HPI: Pt is a 69 year old male presented to the ED today for persistent headaches that have gradually worsened since yesterday evening. CT head 4/26: Large volume subarachnoid hemorrhage, right greater than left. Possible right tentorial subdural hemorrhage versus subarachnoid hemorrhage. [name] on 4/27. 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      SLP Plan  Goals updated       Recommendations  Diet recommendations: Regular;Thin liquid Liquids provided via: Cup;Straw Medication Administration: Whole meds with liquid Supervision: Staff to assist with self feeding Compensations: Slow rate Postural Changes and/or Swallow Maneuvers: Seated upright 90 degrees                Oral Care Recommendations: Oral care BID Follow up Recommendations: None(for dysphagia' see cognitive-linguistic evaluation as well) SLP Visit Diagnosis: Dysphagia, unspecified (R13.10) Plan: Goals updated       GO                Mahala Menghini., M.A. CCC-SLP Acute Rehabilitation Services Pager [phone] Office [phone]  01/22/2020, 10:02 AM

## 2020-01-22 NOTE — Progress Notes (Addendum)
STROKE TEAM PROGRESS NOTE   INTERVAL HISTORY Son, PT/OT and RN are at bedside.  Patient was able to work with PT/OT sitting in bed. Left UE weakness fluctuating yesterday and flaccid overnight. LLE continued to be very weak near flaccid. TCD yesterday showed right MCA and b/l siphon mild vasospasm. BP 210 this am, on pressors  OBJECTIVE Vitals:   01/22/20 0920 01/22/20 0940 01/22/20 1000 01/22/20 1020  BP: (!) 211/86 (!) 202/92 (!) 210/92 (!) 177/100  Pulse: 87 80 85 (!) 103  Resp: 19 (!) 24 (!) 25 20  Temp:      TempSrc:      SpO2: 97% 98% 98% 98%  Weight:      Height:        CBC:  Recent Labs  Lab 01/21/20 0535 01/22/20 0532  WBC 20.5* 20.2*  NEUTROABS  --  15.4*  HGB 15.5 16.5  HCT 47.3 49.3  MCV 87.9 87.3  PLT 214 164    Basic Metabolic Panel:  Recent Labs  Lab 01/17/20 0250 01/19/20 0456 01/20/20 0201 01/21/20 0535 01/21/20 1515 01/22/20 0532  NA 141   < >  --    < > 135 137  K 4.0   < >  --    < > 3.4* 3.7  CL 107   < >  --    < > 99 102  CO2 25   < >  --    < > 25 27  GLUCOSE 182*   < >  --    < > 186* 167*  BUN 23   < >  --    < > 21 23  CREATININE 0.76   < >  --    < > 0.80 0.78  CALCIUM 8.3*   < >  --    < > 8.3* 8.4*  MG 1.8  --  1.9  --  1.9  --   PHOS 2.8  --  1.2*  --   --   --    < > = values in this interval not displayed.    Lipid Panel:     Component Value Date/Time   CHOL 88 01/19/2020 0456   TRIG 124 01/19/2020 0456   HDL 26 (L) 01/19/2020 0456   CHOLHDL 3.4 01/19/2020 0456   VLDL 25 01/19/2020 0456   LDLCALC 37 01/19/2020 0456   HgbA1c:  Lab Results  Component Value Date   HGBA1C 7.7 (H) 01/14/2020   Urine Drug Screen:     Component Value Date/Time   LABOPIA NONE DETECTED 01/19/2020 1058   COCAINSCRNUR NONE DETECTED 01/19/2020 1058   LABBENZ NONE DETECTED 01/19/2020 1058   AMPHETMU NONE DETECTED 01/19/2020 1058   THCU POSITIVE (A) 01/19/2020 1058   LABBARB NONE DETECTED 01/19/2020 1058     IMAGING past 24h DG CHEST  PORT 1 VIEW  Result Date: 01/22/2020 CLINICAL DATA:  Hypoxia/respiratory failure EXAM: PORTABLE CHEST 1 VIEW COMPARISON:  Jan 20, 2020 FINDINGS: There is mild left base atelectasis. The lungs elsewhere are clear. There is cardiomegaly with pulmonary vascularity normal. No adenopathy. There is degenerative change in the thoracic spine. IMPRESSION: Slight left base atelectasis. Lungs elsewhere clear. Stable cardiac prominence. No adenopathy evident. Electronically Signed   By: Bretta BangWilliam  Woodruff III M.D.   On: 01/22/2020 07:59   VAS US TRANSCRANIAL DOPPLER  Result Date: 01/22/2020  Transcranial Doppler Indications: Subarachnoid hemorrhage. Comparison Study: 01/19/20 previous Performing Technologist: Blanch MediaMegan Riddle RVS Supporting Technologist: Sherren Kernsandace Kanady RVS  Examination Guidelines: A  complete evaluation includes B-mode imaging, spectral Doppler, color Doppler, and power Doppler as needed of all accessible portions of each vessel. Bilateral testing is considered an integral part of a complete examination. Limited examinations for reoccurring indications may be performed as noted.  +----------+-------------+----------+-----------+-------+ RIGHT TCD Right VM (cm)Depth (cm)PulsatilityComment +----------+-------------+----------+-----------+-------+ MCA          127.00                 1.13            +----------+-------------+----------+-----------+-------+ ACA          -14.00                 1.22            +----------+-------------+----------+-----------+-------+ Term ICA     108.00                 1.54            +----------+-------------+----------+-----------+-------+ PCA           36.00                 1.35            +----------+-------------+----------+-----------+-------+ Opthalmic     47.00                 2.02            +----------+-------------+----------+-----------+-------+ ICA siphon    36.00                 1.13             +----------+-------------+----------+-----------+-------+ Vertebral    -42.00                 1.29            +----------+-------------+----------+-----------+-------+  +----------+------------+----------+-----------+-------+ LEFT TCD  Left VM (cm)Depth (cm)PulsatilityComment +----------+------------+----------+-----------+-------+ MCA          59.00                 1.37            +----------+------------+----------+-----------+-------+ Term ICA     43.00                 1.30            +----------+------------+----------+-----------+-------+ Opthalmic    34.00                 1.36            +----------+------------+----------+-----------+-------+ ICA siphon   103.00                1.10            +----------+------------+----------+-----------+-------+ Vertebral    -18.00                0.97            +----------+------------+----------+-----------+-------+  +------------+-------+-------+             VM cm/sComment +------------+-------+-------+ Prox Basilar-62.00         +------------+-------+-------+ Summary:  Mildly elevated right middlle crebral and bilateral carotid siphon mean flow velocities indicate mild vasospasm. Normal mean flow velocities in remaining identified vessels of anterior and posterior cerebral circulations.Globally elevated pulsatility indices indicate increase in intracranial pressure likely. *See table(s) above for TCD measurements and observations.  Diagnosing physician: Delia Heady MD Electronically signed by Delia Heady MD on 01/22/2020 at 8:57:11 AM.    Final  Transcranial Doppler 01/16/2020 Elevated right middle cerebral and carotid siphon and left middle cerebral artery and carotid siphon mean flow velocities suggest mild vasospasm. Slight improvement compared with prior TCD study 01/14/20  01/19/2020 Improvement in vasospasm overall but persistent elevated right middle cerebral and left carotid siphon mean flow velocities  suggestive of mild vasospasm.  01/21/2020 Mildly elevated right middlle crebral and bilateral carotid siphon mean  flow velocities indicate mild vasospasm. Normal mean flow velocities in  remaining identified vessels of anterior and posterior cerebral  circulations.Globally elevated pulsatility indices indicate increase in intracranial pressure likely.   PHYSICAL EXAM   Temp:  [97.9 F (36.6 C)-99.7 F (37.6 C)] 99.5 F (37.5 C) (05/04 0800) Pulse Rate:  [69-103] 103 (05/04 1020) Resp:  [12-27] 20 (05/04 1020) BP: (171-211)/(72-102) 177/100 (05/04 1020) SpO2:  [93 %-99 %] 98 % (05/04 1020) Weight:  [96.5 kg] 96.5 kg (05/04 0500)  General - Well nourished, well developed, not in acute distress.  Ophthalmologic - fundi not visualized due to noncooperation.  Cardiovascular - Regular rate and rhythm.  Neuro - awake alert, but lethargic, eye open. Able to answer questions appropriately, orientated to place and age, no dysarthria. B/l CN VI imcomplete gaze. Mild right ptosis, but PERRL. Inconsistently blinking to visual threat bilaterally. Left facial droop, tongue protrusion to the left. Left UE 0/5 proximal and distal, LLE 1/5 on pain stimulation. RUE and RLE at least 4/5 and following commands. B/l babinski positive. Sensation subjectively symmetrical, coordination intact right FTN and gait not tested.   ASSESSMENT/PLAN Mr. FINES KIMBERLIN is a 69 y.o. male with history of diabetes, migraines, herpes simplex virus 1, hypertension, hyperlipidemia, hepatitis A, and benign prostatic hypertrophy transported from [place] after developing sudden onset of severe headache initially with mild left upper extremity drift but still able to carry on a conversation. Later he developed fixed right gaze deviation as well as increased left arm and left leg weakness but was still able to communicate and speak.  He did not receive IV t-PA due to [place].  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 50% diameter stenosis proximal left internal carotid artery. Mild to moderate stenosis in the cavernous carotid bilaterally due to atherosclerotic calcification  CTA head 4/30 - Overall decreased volume of subarachnoid hemorrhage since 01/15/2020. Diminished flow within the right ICA with no enhancement visualized at and beyond the cavernous segment. Mild increased right M1 MCA narrowing and diminished distal right MCA branch filling. New narrowing of the left A1 ACA with severe stenosis near the origin. Persistent irregularity of the distal basilar artery with new superimposed severe stenosis.  IR - significant improvement in vessel caliber and distal territory perfusion after balloon angioplasty of the right internal carotid artery, right middle cerebral artery, and basilar artery  MRI head - Multiple small acute infarcts the right cerebral hemisphere primarily involving the anterior greater than posterior circulations. Thin left larger than right subdural collections with stable mild rightward midline shift.   MRA head - Improved flow within the intracranial right ICA compared to 01/18/2020 CTA. Appearance is now similar to 01/14/2020 CTA with persistent severe stenosis of the supraclinoid portion. Likely persistent left A1 ACA stenosis and distal basilar stenosis.  MRI and MRA head repeat pending  2D Echo -  EF 70 - 75%. Grade I diastolic dysfunction (impaired relaxation).   Sars Corona Virus 2 - negative  LDL - 37  HgbA1c - 7.7  UDS - positive for THC  VTE prophylaxis - heparin subq  aspirin 81 mg daily prior to admission, now on No antithrombotic  Ongoing aggressive stroke risk factor management  Therapy recommendations:   pending  Disposition:  Pending  Stroke - due to vasospasm  CTA head 4/30 - Overall decreased volume of subarachnoid hemorrhage since 01/15/2020. Diminished flow within the right ICA with no enhancement visualized at and beyond the cavernous segment. Mild increased right M1 MCA narrowing and diminished distal right MCA branch filling. New narrowing of the left A1 ACA with severe stenosis near the origin. Persistent irregularity of the distal basilar artery with new superimposed severe stenosis.  IR - significant improvement in vessel caliber and distal territory perfusion after balloon angioplasty of the right internal carotid artery, right middle cerebral artery, and basilar artery  MRI head - Multiple small acute infarcts the right cerebral hemisphere primarily involving the anterior greater than posterior circulations. Thin left larger than right subdural collections with stable mild rightward midline shift.   MRA head - Improved flow within the intracranial right ICA compared to 01/18/2020 CTA. Appearance is now similar to 01/14/2020 CTA with persistent severe stenosis of the supraclinoid portion. Likely persistent left A1 ACA stenosis and distal basilar stenosis.  MRI and MRA head repeat pending  TCD 4/28 - Elevated right middle cerebral and carotid siphon and left middle cerebral artery and carotid siphon mean flow velocities suggest mild vasospasm. Slight improvement compared with prior TCD study 01/14/20   TCD 5/1- improvement but still mild vasospasm  TCD 5/3 - mild right MCA and b/l ICA siphon vasospasm   BP goal 180-220  On Neo, vasopressin, IVF  completed albumin 4 doses  Change nimodipine 60mg  Q4h to 30mg  Q2h  BP 170-190s  Hypertension Triple H therepy  Home BP meds: Lisinopril  Current BP meds: Nimodipine   SBP goal - 180 - 220   On Neo and vasopressin  On IVF @125   Completed albumin  Hyperlipidemia  Home Lipid lowering medication: Lipitor 80 mg daily and  Fenofibrate (Crestor 40 mg daily) Lovaza  LDL - 37, goal < 70  Current lipid lowering medication: Crestor 40 mg daily and Fenofibrate  Continue statin and fenofibrate at discharge  Diabetes  Home diabetic meds: insulin, glucotrol, Actos, Janumet, Xigduo  Current diabetic meds: levemir 35 bid, novolog 10 u tid  HgbA1c 7.7, goal < 7.0  SSI  CBG monitoring  Hyperglycemia much improved  DM coordination consult placed  Fever   Tmax 100.7 -> afebrile  Leukocytosis WBCs - 14.8->18.5->20.5->20.2  Urine culture 4/26 neg   CXR 5/1 - Cardiomegaly without acute abnormality of the lungs in AP portable projection.  UA - neg  blood culture NGTD   Other Stroke Risk Factors  Advanced age  ETOH use, advised to drink no more than 1 alcoholic beverage per day.  Migraines  Other Active Problems  Code status - Full code  Keppra seizure prophelaxis  Hospital day # 8  This patient is critically ill due to stroke, SAH with cerebral vasospasm, left hemiplegia and at significant risk of neurological worsening, death form cerebral edema, recurrent stroke, recurrent bleeding seizure, hydrocephalus. This patient's care requires constant monitoring of vital signs, hemodynamics, respiratory and cardiac monitoring, review of multiple databases, neurological assessment, discussion with family, other specialists and medical decision making of high  complexity. I spent 40 minutes of neurocritical care time in the care of this patient. I had long discussion with son at bedside, updated pt current condition, treatment plan and potential prognosis, and answered all the questions. He expressed understanding and appreciation.    Marvel Plan, MD PhD Stroke Neurology 01/22/2020 10:34 AM  To contact Stroke Continuity provider, please refer to [email or web address]. After hours, contact General Neurology

## 2020-01-22 NOTE — Progress Notes (Signed)
I have reviewed the MRI/mra performed this afternoon. In particular, the MRA in comparison to the immediate post angioplasty MRA performed over this past weekend does not demonstrate any significant worsening of bilateral internal carotid artery vasopasm. We will therefore continue with hyperdynamic therapy rather than repeat angiogram / balloon angioplasty.

## 2020-01-22 NOTE — Progress Notes (Signed)
Physical Therapy Treatment - RE- EVALUATION  Patient Details Name: Nicholas [name] MRN: [id number] DOB: [birth year] Today's Date: 01/22/2020    History of Present Illness 69 year old male presented to the ED today for persistent headaches that have gradually worsened since yesterday evening accompanied by nausea and vomiting. Spouse acknowledges some confusion is present. CTA demonstrating a large SAH in the basilar cisterns extending into bilateral sylvian fissures. Pt underwent cerebral angiogram - no evidence of aneurysm, however, noted with vasospasm.  on 4/30, he developed Lt hemiparesis with Rt gaze preference.  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  PMH includes: HTN, hepatitis A, DM, BPH, HTN, migraine headaches    PT Comments    PT re-eval completed as pt was undergoing vasospasm and found to have a new stroke. Pt now with dense L hemiplegia, L sided inattention, impaired balance, and impaired sequencing. Pt was ambulating in the hallway last Thursday 4/29 without AD. Pt now requiring maxA x2 for EOB balance. Now recommending CIR upon d/c for maximal functional recovery. Acute PT to cont to follow.    Follow Up Recommendations  CIR     Equipment Recommendations  (TBD at next venue)    Recommendations for Other Services       Precautions / Restrictions Precautions Precautions: Fall Precaution Comments: SBP 180-220; Lt hemiparesis with Lt neglect  Restrictions Weight Bearing Restrictions: No    Mobility  Bed Mobility Overal bed mobility: Needs Assistance Bed Mobility: Supine to Sit;Sit to Supine     Supine to sit: Max assist;+2 for physical assistance;+2 for safety/equipment Sit to supine: Max assist;+2 for physical assistance;+2 for safety/equipment   General bed mobility comments: Pt requires assist to move LEs off and onto the bed and assist to lift and lower  his trunk.  He is able to assist with pulling trunk upright   Transfers                 General transfer comment: Unable to safely attempt this date   Ambulation/Gait                 Stairs             Wheelchair Mobility    Modified Rankin (Stroke Patients Only) Modified Rankin (Stroke Patients Only) Pre-Morbid Rankin Score: No symptoms Modified Rankin: Severe disability     Balance Overall balance assessment: Needs assistance Sitting-balance support: Feet supported;Single extremity supported Sitting balance-Leahy Scale: Poor Sitting balance - Comments: Pt initially required mod A to maintain EOB sitting, but progressed to min A.  He was able to activate Lt trunk after working on rotation of trunk to the Lt  Postural control: Left lateral lean                                  Cognition Arousal/Alertness: Awake/alert Behavior During Therapy: Flat affect Overall Cognitive Status: Impaired/Different from baseline Area of Impairment: Attention;Memory;Following commands;Awareness;Problem solving                   Current Attention Level: Focused;Sustained Memory: Decreased short-term memory Following Commands: Follows one step commands inconsistently;Follows one step commands with increased time   Awareness: Intellectual Problem Solving: Slow processing;Decreased initiation;Difficulty sequencing;Requires verbal cues General Comments: in supine pt unable to stay awake/focus for longer than 45-60 sec. once seated EOB  pt stayed engaged x 8 min      Exercises      General Comments General comments (skin integrity, edema, etc.): BP within paramenters however did have a couple of runs of Vtach, RN present and aware      Pertinent Vitals/Pain Pain Assessment: Faces Faces Pain Scale: Hurts even more Pain Location: back with transitional movements, hamstrings with LAQ at EOB Pain Descriptors / Indicators: Discomfort Pain  Intervention(s): Monitored during session    Home Living Family/patient expects to be discharged to:: Private residence Living Arrangements: Spouse/significant other Available Help at Discharge: Family;Available 24 hours/day Type of Home: House Home Access: Stairs to enter Entrance Stairs-Rails: Right Home Layout: One level Home Equipment: Walker - 2 wheels;Crutches;Bedside commode      Prior Function Level of Independence: Independent      Comments: working as Magazine features editor   PT Goals (current goals can now be found in the care plan section) Acute Rehab PT Goals Patient Stated Goal: to be able to regain independence  PT Goal Formulation: With patient Time For Goal Achievement: 02/05/20 Potential to Achieve Goals: Good Progress towards PT goals: Not progressing toward goals - comment    Frequency    Min 4X/week      PT Plan Discharge plan needs to be updated    Co-evaluation PT/OT/SLP Co-Evaluation/Treatment: Yes Reason for Co-Treatment: Complexity of the patient's impairments (multi-system involvement) PT goals addressed during session: Mobility/safety with mobility OT goals addressed during session: ADL's and self-care;Strengthening/ROM      AM-PAC PT "6 Clicks" Mobility   Outcome Measure  Help needed turning from your back to your side while in a flat bed without using bedrails?: Total Help needed moving from lying on your back to sitting on the side of a flat bed without using bedrails?: Total Help needed moving to and from a bed to a chair (including a wheelchair)?: Total Help needed standing up from a chair using your arms (e.g., wheelchair or bedside chair)?: Total Help needed to walk in hospital room?: Total Help needed climbing 3-5 steps with a railing? : Total 6 Click Score: 6    End of Session Equipment Utilized During Treatment: Oxygen Activity Tolerance: Patient tolerated treatment well Patient left: in bed;with call bell/phone within reach;with bed  alarm set;with family/visitor present;with nursing/sitter in room Nurse Communication: Mobility status PT Visit Diagnosis: Unsteadiness on feet (R26.81);Other abnormalities of gait and mobility (R26.89)     Time: 1696-7893 PT Time Calculation (min) (ACUTE ONLY): 34 min  Charges:                        Lewis Shock, PT, DPT Acute Rehabilitation Services Pager #: [phone] Office #: [phone]    Iona Hansen 01/22/2020, 1:59 PM

## 2020-01-22 NOTE — Progress Notes (Signed)
Rehab Admissions Coordinator Note:  Per PT and OT recommendation, this patient was screened by Cheri Rous for appropriateness for an Inpatient Acute Rehab Consult.  At this time, we will follow for additional therapy sessions to see if pt is able to continue to progress past EOB activities prior to requesting consult order.   Cheri Rous 01/22/2020, 3:51 PM  I can be reached at [phone].

## 2020-01-23 ENCOUNTER — Inpatient Hospital Stay (HOSPITAL_COMMUNITY): Payer: BC Managed Care – PPO

## 2020-01-23 DIAGNOSIS — I454 Nonspecific intraventricular block: Secondary | ICD-10-CM

## 2020-01-23 DIAGNOSIS — I609 Nontraumatic subarachnoid hemorrhage, unspecified: Secondary | ICD-10-CM

## 2020-01-23 DIAGNOSIS — I4729 Other ventricular tachycardia: Secondary | ICD-10-CM

## 2020-01-23 DIAGNOSIS — I67848 Other cerebrovascular vasospasm and vasoconstriction: Secondary | ICD-10-CM | POA: Diagnosis not present

## 2020-01-23 DIAGNOSIS — I472 Ventricular tachycardia: Secondary | ICD-10-CM

## 2020-01-23 LAB — BASIC METABOLIC PANEL
Anion gap: 12 (ref 5–15)
Anion gap: 9 (ref 5–15)
BUN: 22 mg/dL (ref 8–23)
BUN: 22 mg/dL (ref 8–23)
CO2: 26 mmol/L (ref 22–32)
CO2: 27 mmol/L (ref 22–32)
Calcium: 8.2 mg/dL — ABNORMAL LOW (ref 8.9–10.3)
Calcium: 8.2 mg/dL — ABNORMAL LOW (ref 8.9–10.3)
Chloride: 97 mmol/L — ABNORMAL LOW (ref 98–111)
Chloride: 100 mmol/L (ref 98–111)
Creatinine, Ser: 0.73 mg/dL (ref 0.61–1.24)
Creatinine, Ser: 0.79 mg/dL (ref 0.61–1.24)
GFR calc Af Amer: >60 mL/min (ref >60)
GFR calc Af Amer: >60 mL/min (ref >60)
GFR calc non Af Amer: >60 mL/min (ref >60)
GFR calc non Af Amer: >60 mL/min (ref >60)
Glucose, Bld: 178 mg/dL — ABNORMAL HIGH (ref 70–99)
Glucose, Bld: 193 mg/dL — ABNORMAL HIGH (ref 70–99)
Potassium: 3.3 mmol/L — ABNORMAL LOW (ref 3.5–5.1)
Potassium: 3.4 mmol/L — ABNORMAL LOW (ref 3.5–5.1)
Sodium: 135 mmol/L (ref 135–145)
Sodium: 136 mmol/L (ref 135–145)

## 2020-01-23 LAB — CBC WITH DIFFERENTIAL/PLATELET
Abs Immature Granulocytes: 0.22 10*3/uL — ABNORMAL HIGH (ref 0.00–0.07)
Basophils Absolute: 0 10*3/uL (ref 0.0–0.1)
Basophils Relative: 0 %
Eosinophils Absolute: 0 10*3/uL (ref 0.0–0.5)
Eosinophils Relative: 0 %
HCT: 44.5 % (ref 39.0–52.0)
Hemoglobin: 15.2 g/dL (ref 13.0–17.0)
Immature Granulocytes: 1 %
Lymphocytes Relative: 8 %
Lymphs Abs: 1.7 10*3/uL (ref 0.7–4.0)
MCH: 29.7 pg (ref 26.0–34.0)
MCHC: 34.2 g/dL (ref 30.0–36.0)
MCV: 86.9 fL (ref 80.0–100.0)
Monocytes Absolute: 2.2 10*3/uL — ABNORMAL HIGH (ref 0.1–1.0)
Monocytes Relative: 11 %
Neutro Abs: 15.9 10*3/uL — ABNORMAL HIGH (ref 1.7–7.7)
Neutrophils Relative %: 80 %
Platelets: 127 10*3/uL — ABNORMAL LOW (ref 150–400)
RBC: 5.12 MIL/uL (ref 4.22–5.81)
RDW: 13.6 % (ref 11.5–15.5)
WBC: 20.1 10*3/uL — ABNORMAL HIGH (ref 4.0–10.5)
nRBC: 0 % (ref 0.0–0.2)

## 2020-01-23 LAB — GLUCOSE, CAPILLARY
Glucose-Capillary: 152 mg/dL — ABNORMAL HIGH (ref 70–99)
Glucose-Capillary: 226 mg/dL — ABNORMAL HIGH (ref 70–99)
Glucose-Capillary: 242 mg/dL — ABNORMAL HIGH (ref 70–99)
Glucose-Capillary: 180 mg/dL — ABNORMAL HIGH (ref 70–99)
Glucose-Capillary: 198 mg/dL — ABNORMAL HIGH (ref 70–99)
Glucose-Capillary: 159 mg/dL — ABNORMAL HIGH (ref 70–99)

## 2020-01-23 LAB — MAGNESIUM: Magnesium: 1.8 mg/dL (ref 1.7–2.4)

## 2020-01-23 LAB — TSH: TSH: 0.736 u[IU]/mL (ref 0.350–4.500)

## 2020-01-23 MED ORDER — POTASSIUM CHLORIDE 10 MEQ/50ML IV SOLN
10.0000 meq | INTRAVENOUS | Status: AC
  Administered 2020-01-23 (×3): 10 meq via INTRAVENOUS
  Filled 2020-01-23 (×3): qty 50

## 2020-01-23 MED ORDER — METHOCARBAMOL 500 MG PO TABS
500.0000 mg | ORAL_TABLET | Freq: Three times a day (TID) | ORAL | Status: DC | PRN
  Administered 2020-01-23 – 2020-02-02 (×14): 500 mg via ORAL
  Filled 2020-01-23 (×14): qty 1

## 2020-01-23 MED ORDER — POTASSIUM CHLORIDE 10 MEQ/100ML IV SOLN
10.0000 meq | INTRAVENOUS | Status: AC
  Administered 2020-01-23 (×4): 10 meq via INTRAVENOUS
  Filled 2020-01-23 (×4): qty 100

## 2020-01-23 MED ORDER — FONDAPARINUX SODIUM 2.5 MG/0.5ML ~~LOC~~ SOLN
2.5000 mg | Freq: Every day | SUBCUTANEOUS | Status: DC
  Administered 2020-01-23 – 2020-01-28 (×6): 2.5 mg via SUBCUTANEOUS
  Filled 2020-01-23 (×6): qty 0.5

## 2020-01-23 MED ORDER — METOPROLOL TARTRATE 25 MG PO TABS
12.5000 mg | ORAL_TABLET | Freq: Two times a day (BID) | ORAL | Status: DC
  Administered 2020-01-23 – 2020-01-24 (×2): 12.5 mg via ORAL
  Filled 2020-01-23 (×2): qty 1

## 2020-01-23 MED ORDER — FONDAPARINUX SODIUM 7.5 MG/0.6ML ~~LOC~~ SOLN
7.5000 mg | Freq: Every day | SUBCUTANEOUS | Status: DC
  Filled 2020-01-23: qty 0.6

## 2020-01-23 MED ORDER — MAGNESIUM SULFATE 2 GM/50ML IV SOLN
2.0000 g | Freq: Once | INTRAVENOUS | Status: AC
  Administered 2020-01-23: 2 g via INTRAVENOUS
  Filled 2020-01-23: qty 50

## 2020-01-23 NOTE — Progress Notes (Signed)
Physical Therapy Treatment Patient Details Name: Nicholas [name] MRN: [id number] DOB: 27-Jan-1951 Today's Date: 01/23/2020    History of Present Illness 69 y.o. male admitted on 01/14/20 for HA and confusion found to have large volume SAH with bil ICA, MCA, BA vasospasm s/p angioplasty.  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 Pt also having runs of V-tach, so cardiology consulted.  Pt with significant PMH of DM, HTN, herpes simplex virus-1, hyperlipidemia, Hepatitis A.      PT Comments    Pt able to stand EOB today with two person assist (son providing second set of hands), wife present and encouraging pt.  He remains very weak with trace muscle activation in his left leg, R gaze, and significant cognitive impairments.  He would likely do well with stedy standing frame next session.  He remains highly appropriate for intensive post acute, multi disciplinary rehab.  PT will continue to follow acutely for safe mobility progression.   Follow Up Recommendations  CIR     Equipment Recommendations  Wheelchair (measurements PT);Wheelchair cushion (measurements PT);Hospital bed    Recommendations for Other Services Rehab consult     Precautions / Restrictions Precautions Precautions: Fall Precaution Comments: SBP 180-220; Lt hemiparesis with Lt neglect     Mobility  Bed Mobility Overal bed mobility: Needs Assistance Bed Mobility: Supine to Sit;Sit to Supine     Supine to sit: Mod assist;HOB elevated Sit to supine: Max assist;+2 for physical assistance   General bed mobility comments: Mod one person assist to come up to sitting EOB from maximally elevated HOB using R hand rail to right side.  Max two person assist to help return to supine (flat bed) to support trunk and lift both legs.   Transfers Overall transfer level: Needs assistance Equipment used: 2 person hand held assist Transfers: Sit to/from Stand Sit to  Stand: Mod assist;+2 physical assistance         General transfer comment: Two person heavy mod assist to come to standing EOB.  Son assisting on pt's R strong side, pt pulling on maximally inclined HOB bed rail, encouraged to lean towards his son as if going to hug him as he came up to standing to decrease his left lateral lean, left knee blocked and hips supported by PT, cues for upright posture  Ambulation/Gait             General Gait Details: unable at this time        Modified Rankin (Stroke Patients Only) Modified Rankin (Stroke Patients Only) Pre-Morbid Rankin Score: No symptoms Modified Rankin: Severe disability     Balance Overall balance assessment: Needs assistance Sitting-balance support: Feet supported;Single extremity supported Sitting balance-Leahy Scale: Zero Sitting balance - Comments: up to max assist EOB, did lateral leans to the right to try to deminish left pushing with strong right hand.  Reaching right, and reaching across midline to left side of body.  Postural control: Left lateral lean Standing balance support: Single extremity supported Standing balance-Leahy Scale: Poor Standing balance comment: heavy mod assist in standing to block L knee, weight shift body to the right, and support trunk.                             Cognition Arousal/Alertness: Awake/alert Behavior During Therapy: Flat affect Overall Cognitive Status: Impaired/Different from baseline Area of Impairment: Attention;Following commands;Memory;Safety/judgement;Awareness  Current Attention Level: Focused;Sustained Memory: Decreased short-term memory Following Commands: Follows one step commands inconsistently Safety/Judgement: Decreased awareness of safety;Decreased awareness of deficits Awareness: Intellectual Problem Solving: Slow processing;Decreased initiation;Difficulty sequencing;Requires verbal cues General Comments: Pt talking about  finding the right program/file throughout the session.  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  He is unaware of his right lean and when he stands he says he feels "heavy" but cannot localize that to his left sided weakness.        Exercises      General Comments        Pertinent Vitals/Pain Pain Assessment: Faces Faces Pain Scale: Hurts even more Pain Location: HA and lower back into legs Pain Descriptors / Indicators: Discomfort;Grimacing;Guarding Pain Intervention(s): Limited activity within patient's tolerance;Monitored during session;Repositioned           PT Goals (current goals can now be found in the care plan section) Acute Rehab PT Goals Patient Stated Goal: to get up on his feet Progress towards PT goals: Progressing toward goals    Frequency    Min 4X/week      PT Plan Current plan remains appropriate       AM-PAC PT "6 Clicks" Mobility   Outcome Measure  Help needed turning from your back to your side while in a flat bed without using bedrails?: A Lot Help needed moving from lying on your back to sitting on the side of a flat bed without using bedrails?: A Lot Help needed moving to and from a bed to a chair (including a wheelchair)?: Total Help needed standing up from a chair using your arms (e.g., wheelchair or bedside chair)?: A Lot Help needed to walk in hospital room?: Total Help needed climbing 3-5 steps with a railing? : Total 6 Click Score: 9    End of Session Equipment Utilized During Treatment: Oxygen Activity Tolerance: Patient limited by pain Patient left: in bed;with call bell/phone within reach;with bed alarm set;with family/visitor present Nurse Communication: Mobility status PT Visit Diagnosis: Muscle weakness (generalized) (M62.81);Hemiplegia and hemiparesis Hemiplegia - Right/Left: Left Hemiplegia - dominant/non-dominant: Non-dominant Hemiplegia - caused by: Nontraumatic intracerebral hemorrhage      Time: 0254-2706 PT Time Calculation (min) (ACUTE ONLY): 72 min   Corinna Capra, PT, DPT  Acute Rehabilitation [phone] pager [phone] office      Charges:  $Therapeutic Activity: 23-37 mins $Neuromuscular Re-education: 38-52 mins                    01/23/2020, 9:58 PM

## 2020-01-23 NOTE — Progress Notes (Signed)
Patient's wife expressing concerns over MRI conducted yesterday, facilitated call between wife and NSG. All questions answered satisfactorily at this time.  [name], RN

## 2020-01-23 NOTE — Progress Notes (Signed)
TCD       has been completed. Preliminary results can be found under CV proc through chart review. Nicholas [name], BS, RDMS, RVT   

## 2020-01-23 NOTE — Progress Notes (Signed)
Following patient at request of family for support during serious illness, ACP and symptom management. Patient complaining of severe neck pain and HA. He appears in moderate distress. He is able to converse at a basic level but also has periods where he is not logical and confused. Family with appropriate anxiety in this situation but are hopeful for his recovery.  Gave X1 dose of Fentanyl for pain- he has both oxycodone and IV hydromorphone ordered as well as tylenol. Will discuss with care team in AM.  Anderson Malta, DO Palliative Medicine

## 2020-01-23 NOTE — Progress Notes (Signed)
  NEUROSURGERY PROGRESS NOTE   No issues overnight.  No concerns this am  EXAM:  BP (!) 181/87   Pulse 92   Temp 98.7 F (37.1 C) (Axillary)   Resp (!) 21   Ht 6' (1.829 m)   Wt 96.5 kg   SpO2 92%   BMI 28.85 kg/m   Awake, alert, oriented  Speech fluent, appropriate, dysarthric Left facial droop, right CN3 palsy Moving RUE/RLE well Flickered left toes  IMPRESSION/PLAN 69 y.o. male  SAH d#9 with left hemiparesis related to severe vasospasm, now flickering left toes - Continue to maintain hypertension SBP > and IVF hydration - Continue Nimotop, supportive care - PT/OT

## 2020-01-23 NOTE — Progress Notes (Signed)
Patient converting into ventricular rhythm and sustaining for 5-10 minutes each time, patient assessed and is asymptomatic. Patient converts back into NSR with no interventions for now. CCM MD notified, new orders received.  [name], RN

## 2020-01-23 NOTE — Progress Notes (Signed)
Results for SYLER, NORCIA (MRN [id number]) as of 01/23/2020 14:21  Ref. Range 01/22/2020 23:07 01/23/2020 03:15 01/23/2020 07:45 01/23/2020 11:20  Glucose-Capillary Latest Ref Range: 70 - 99 mg/dL 700 (H) 174 (H) 944 (H) 242 (H)  Noted that CBGs have been greater than 200 mg/dl.  Recommend increasing Novolog to 12 units TID with meals if patient eats at least 50% of meal and blood sugars continue to be elevated.   Smith Mince RN BSN CDE Diabetes Coordinator Pager: [phone]  8am-5pm

## 2020-01-23 NOTE — Consult Note (Addendum)
Cardiology Consultation:   Patient ID: Nicholas [name] MRN: [id number]; DOB: 12/23/1950  Admit date: 01/14/2020 Date of Consult: 01/23/2020  Primary Care Provider: Gillian Scarce, MD Primary Cardiologist: Chrystie Nose, MD  Primary Electrophysiologist:  None   Patient Profile:   Nicholas [name] is a 69 y.o. male with a hx of BPH, colon polyp, deviated septum, DM2, hepatitis A, HSV-1 infection, HLD, HTN, migraine headache who was admitted for [facility] who is being seen today for the evaluation of wide complex tachycardia at the request of Dr. Conchita Paris.  History of Present Illness:   Nicholas [name] has no pmh of MI, stent, heart failure. No family history of cardiac issues. Denies tobacco, alcohol, and drug use. He was taking medications for HTN, HLD, and DM2.   The patient was [facility]/20 [facility] with nuchal rigidity and left hemiplegia found to have large SAH in the basilars cisterns extending into bilateral sylvian fissures. No aneurysm was visualized on CTA nor catheter angiogram. He had clinical vasospasm of basilar, MCA and right ICA s/p angioplasty. Cardiology was consulted for persistent and worsening WCT.  Labs: potassium 3.4 Glucose 193 Creatinine 0.79 Mag 1.8   On interview the patient says he is completely asymptomatic. No chest pain, palpitations, dizziness, lightheadedness. He does report some back pain.    Echo 01/15/20 showed EF 70-75%, G1DD, normal RV function, mild aortic dilation noted   Past Medical History:  Diagnosis Date  . BPH (benign prostatic hypertrophy) 12/20/2011   Dr. Cleatrice Burke  . Colon polyp    Dr. Vernell Barrier, it was recommended that he have a follow-up colonoscopy in 2010.  Marland Kitchen Deviated septum   . Diabetes mellitus without complication [facility])    Eye exam - 2011  . Hepatitis A   . HSV-1 (herpes simplex virus 1) infection   . Hyperlipidemia   . Hypertension   . Migraine headache     Past Surgical History:  Procedure Laterality Date  . IR ANGIO  EXTERNAL CAROTID SEL EXT CAROTID BILAT MOD SED  01/15/2020  . IR ANGIO INTRA EXTRACRAN SEL INTERNAL CAROTID BILAT MOD SED  01/15/2020  . IR ANGIO VERTEBRAL SEL VERTEBRAL BILAT MOD SED  01/15/2020  . IR ENDOVASC INTRACRANIAL INF OTHER THAN THROMBO ART INC DIAG ANGIO  01/18/2020  . IR ENDOVASC INTRACRANIAL INF OTHER THAN THROMBO ART INC DIAG ANGIO EA ADD  01/18/2020  . IR PTA VASOSPASM ADD DIFF  01/18/2020  . IR PTA VASOSPASM INITIAL  01/18/2020  . NASAL SEPTUM SURGERY    . RADIOLOGY WITH ANESTHESIA N/A 01/15/2020   Procedure: IR WITH ANESTHESIA;  Surgeon: Lisbeth Renshaw, MD;  Location: [facility] OR;  Service: Radiology;  Laterality: N/A;  . RADIOLOGY WITH ANESTHESIA N/A 01/18/2020   Procedure: IR WITH ANESTHESIA;  Surgeon: Lisbeth Renshaw, MD;  Location: [facility] OR;  Service: Radiology;  Laterality: N/A;     Home Medications:  Prior to Admission medications   Medication Sig Start Date End Date Taking? Authorizing Provider  aspirin (ASPIRIN ADULT LOW STRENGTH) 81 MG EC tablet Take 81 mg by mouth daily. Swallow whole.   Yes [provider]  atorvastatin (LIPITOR) 80 MG tablet Take 1 tablet (80 mg total) by mouth daily. 07/17/13 01/14/20 Yes Zanard, Hinton Dyer, MD  cholecalciferol (VITAMIN D3) 25 MCG (1000 UNIT) tablet Take 1,000 Units by mouth daily.   Yes [provider]  CRESTOR 40 MG tablet Take 1 tablet (40 mg total) by mouth daily. 04/01/14 01/14/20 Yes Zanard, Hinton Dyer, MD  fenofibrate 160 MG tablet Take 1 tablet (160 mg total) by mouth daily. 01/22/14 01/14/20 Yes Zanard, Hinton Dyer, MD  insulin aspart (NOVOLOG) 100 UNIT/ML FlexPen Inject 75 Units into the skin daily.  10/04/19 10/03/20 Yes [provider]  insulin degludec (TRESIBA) 200 UNIT/ML FlexTouch Pen Inject 35 Units into the skin daily.    Yes [provider]  insulin detemir (LEVEMIR) 100 UNIT/ML FlexPen Inject 20 Units into the skin See admin instructions. Sliding scale : Takes 15 units if blood sugar more than 300. Max  20 units. 10/04/19 10/03/20 Yes [provider]  lisinopril (PRINIVIL,ZESTRIL) 20 MG tablet Take 1 tablet (20 mg total) by mouth daily. 01/22/14 01/14/20 Yes Zanard, Hinton Dyer, MD  XIGDUO XR 06-999 MG TB24 Take 1 tablet by mouth daily. 08/24/19  Yes [provider]  glipiZIDE (GLUCOTROL XL) 10 MG 24 hr tablet Take 1 tablet (10 mg total) by mouth daily with breakfast. Patient not taking: Reported on 01/14/2020 04/01/14 01/14/20  Gillian Scarce, MD  omega-3 acid ethyl esters (LOVAZA) 1 G capsule Take 2 capsules (2 g total) by mouth 2 (two) times daily. Patient not taking: Reported on 01/14/2020 04/01/14 04/01/15  Gillian Scarce, MD  pioglitazone (ACTOS) 45 MG tablet Take 1 tablet (45 mg total) by mouth daily. Patient not taking: Reported on 01/14/2020 01/22/14 02/11/15  Gillian Scarce, MD  sitaGLIPtin-metformin (JANUMET) 50-1000 MG per tablet Take 1 tablet by mouth 2 (two) times daily with a meal. Patient not taking: Reported on 01/14/2020 01/22/14 01/22/15  Gillian Scarce, MD    Inpatient Medications: Scheduled Meds: . bisacodyl  10 mg Oral Q0600  . Chlorhexidine Gluconate Cloth  6 each Topical Daily  . cholecalciferol  1,000 Units Oral Daily  . fondaparinux (ARIXTRA) injection  2.5 mg Subcutaneous Q0600  . insulin aspart  0-24 Units Subcutaneous Q4H  . insulin aspart  10 Units Subcutaneous TID WC  . insulin detemir  35 Units Subcutaneous BID  . levETIRAcetam  500 mg Oral BID  . magnesium hydroxide  30 mL Oral Daily  . niMODipine  30 mg Oral Q2H   Or  . niMODipine  30 mg Oral Q2H  . rosuvastatin  40 mg Oral Daily  . senna-docusate  1 tablet Oral BID  . sodium chloride flush  10-40 mL Intracatheter Q12H   Continuous Infusions: . sodium chloride 0 mL/hr at 01/14/20 1126  . sodium chloride 125 mL/hr at 01/23/20 1700  . norepinephrine (LEVOPHED) Adult infusion Stopped (01/23/20 1459)  . phenylephrine (NEO-SYNEPHRINE) Adult infusion 400 mcg/min (01/23/20 1700)  . potassium chloride 50  mL/hr at 01/23/20 1645  . vasopressin (PITRESSIN) infusion - *FOR SHOCK* 0.04 Units/min (01/23/20 1700)   PRN Meds: sodium chloride, acetaminophen **OR** acetaminophen (TYLENOL) oral liquid 160 mg/5 mL **OR** acetaminophen, bisacodyl, HYDROmorphone (DILAUDID) injection, methocarbamol, oxyCODONE-acetaminophen, simethicone, sodium chloride flush  Allergies:   No Known Allergies  Social History:   Social History   Socioeconomic History  . Marital status: Married    Spouse name: Mary  . Number of children: 3  . Years of education: 10   . Highest education level: Not on file  Occupational History  . Occupation: SYSTEM ANALYST PROGRAMMER    Employer: [employer]  Tobacco Use  . Smoking status: Never Smoker  . Smokeless tobacco: Never Used  Substance and Sexual Activity  . Alcohol use: Yes    Comment: He says that he only drinks occasionally.    . Drug use: No  . Sexual  activity: Yes    Partners: Female  Other Topics Concern  . Not on file  Social History Narrative   Marital Status:  Married Nurse, learning disability)    Children:  Daughter (Meta) Sons Burton [place], [place])   Pets:  Dogs (2)    Living Situation: Lives with spouse   Occupation: Audiological scientist   Education:  Forensic psychologist   Tobacco Use/Exposure:  None    Alcohol Use:  Occasional   Drug Use:  None   Diet:  Regular   Exercise:  None   Hobbies:  Computers   Social Determinants of Radio broadcast assistant Strain:   . Difficulty of Paying Living Expenses:   Food Insecurity:   . Worried About Charity fundraiser in the Last Year:   . Arboriculturist in the Last Year:   Transportation Needs:   . Film/video editor (Medical):   Marland Kitchen Lack of Transportation (Non-Medical):   Physical Activity:   . Days of Exercise per Week:   . Minutes of Exercise per Session:   Stress:   . Feeling of Stress :   Social Connections:   . Frequency of Communication with Friends and Family:   . Frequency of Social Gatherings with Friends  and Family:   . Attends Religious Services:   . Active Member of Clubs or Organizations:   . Attends Archivist Meetings:   Marland Kitchen Marital Status:   Intimate Partner Violence:   . Fear of Current or Ex-Partner:   . Emotionally Abused:   Marland Kitchen Physically Abused:   . Sexually Abused:     Family History:   Family History  Problem Relation Age of Onset  . Diabetes Mother   . Neuropathy Mother   . Diabetes Father   . Cancer Father        Lung Cancer  . Diabetes Sister   . Diabetes Brother      ROS:  Please see the history of present illness.  All other ROS reviewed and negative.     Physical Exam/Data:   Vitals:   01/23/20 1600 01/23/20 1615 01/23/20 1630 01/23/20 1645  BP: (!) 184/73 (!) 195/87 (!) 169/94 (!) 169/77  Pulse: 79 (!) 101 79 82  Resp: 19 (!) 26 (!) 24 12  Temp: 97.6 F (36.4 C)     TempSrc: Axillary     SpO2: 96% 97% 97% 97%  Weight:      Height:        Intake/Output Summary (Last 24 hours) at 01/23/2020 1722 Last data filed at 01/23/2020 1700 Gross per 24 hour  Intake 4560.12 ml  Output 6150 ml  Net -1589.88 ml   Last 3 Weights 01/22/2020 01/21/2020 01/20/2020  Weight (lbs) 212 lb 11.9 oz 199 lb 1.2 oz 193 lb 9 oz  Weight (kg) 96.5 kg 90.3 kg 87.8 kg     Body mass index is 28.85 kg/m.  General:  Well nourished, well developed, in no acute distress HEENT: normal Lymph: no adenopathy Neck: no JVD Endocrine:  No thryomegaly Vascular: No carotid bruits; FA pulses 2+ bilaterally without bruits  Cardiac:  normal S1, S2; RRR; no murmur  Lungs:  clear to auscultation bilaterally, no wheezing, rhonchi or rales  Abd: firm abdomen, no hepatomegaly  Ext: Trace edema Musculoskeletal:  No deformities, BUE and BLE strength normal and equal Skin: warm and dry  Neuro:  Left hemiplagia Psych:  Normal affect   EKG:  The EKG was personally reviewed and demonstrates:  EKG shows narrow QRS followed by brief run of WCT RBBB morphology (rates 90-100bmp) ?retrograde P  waves and back to NSR  Telemetry:  Telemetry was personally reviewed and demonstrates:  Tele shows frequent ectopy over the last 2 days (reportedly going on for a while), PVCs and intermittent WCT with RBBB morphology and intermittent VT (longest run 7 beats).  Relevant CV Studies:  Echo 01/15/20 1. Normal LV systolic function; proximal septal thickening; grade 1  diastolic dysfunction; mildly dilated aortic root; scerotic aortic valve.  2. Left ventricular ejection fraction, by estimation, is 70 to 75%. The  left ventricle has hyperdynamic function. The left ventricle has no  regional wall motion abnormalities. There is mild left ventricular  hypertrophy of the basal segment. Left  ventricular diastolic parameters are consistent with Grade I diastolic  dysfunction (impaired relaxation).  3. Right ventricular systolic function is normal. The right ventricular  size is normal.  4. The mitral valve is normal in structure. No evidence of mitral valve  regurgitation. No evidence of mitral stenosis.  5. The aortic valve is tricuspid. Aortic valve regurgitation is not  visualized. Mild to moderate aortic valve sclerosis/calcification is  present, without any evidence of aortic stenosis.  6. Aortic dilatation noted. There is mild dilatation of the aortic root  measuring 38 mm.  7. The inferior vena cava is normal in size with greater than 50%  respiratory variability, suggesting right atrial pressure of 3 mmHg.   Laboratory Data:  High Sensitivity Troponin:  No results for input(s): TROPONINIHS in the last 720 hours.   Chemistry Recent Labs  Lab 01/22/20 0532 01/23/20 0530 01/23/20 1334  NA 137 135 136  K 3.7 3.3* 3.4*  CL 102 97* 100  CO2 27 26 27   GLUCOSE 167* 178* 193*  BUN 23 22 22   CREATININE 0.78 0.73 0.79  CALCIUM 8.4* 8.2* 8.2*  GFRNONAA >60 >60 >60  GFRAA >60 >60 >60  ANIONGAP 8 12 9     No results for input(s): PROT, ALBUMIN, AST, ALT, ALKPHOS, BILITOT in the  last 168 hours. Hematology Recent Labs  Lab 01/21/20 0535 01/22/20 0532 01/23/20 0530  WBC 20.5* 20.2* 20.1*  RBC 5.38 5.65 5.12  HGB 15.5 16.5 15.2  HCT 47.3 49.3 44.5  MCV 87.9 87.3 86.9  MCH 28.8 29.2 29.7  MCHC 32.8 33.5 34.2  RDW 13.4 13.5 13.6  PLT 214 164 127*   BNPNo results for input(s): BNP, PROBNP in the last 168 hours.  DDimer No results for input(s): DDIMER in the last 168 hours.   Radiology/Studies:  MR ANGIO HEAD WO CONTRAST  Result Date: 01/22/2020 CLINICAL DATA:  69 year old male who presented with acute headache on 01/14/2020 found to have subarachnoid hemorrhage and severe intracranial Vasospasm which was treated with balloon angioplasty on 01/18/2020. No intracranial aneurysm has been identified. 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 Dense left hemiplegia now. EXAM: MRI HEAD WITHOUT CONTRAST MRA HEAD WITHOUT CONTRAST TECHNIQUE: Multiplanar, multiecho pulse sequences of the brain and surrounding structures were obtained without intravenous contrast. Angiographic images of the head were obtained using MRA technique without contrast. COMPARISON:  Brain MRI and intracranial MRA 01/19/2020 and earlier. FINDINGS: MRI HEAD FINDINGS Brain: Left side subdural hematoma measures up to 9 mm thickness now on coronal images, previously 6-7 mm). Unchanged signal within the left subdural blood products, mildly heterogeneous FLAIR signal within the left subdural hematoma. Trace right subdural blood mostly along the parietal and occipital lobes appears  decreased. Rightward midline shift has increased to 7 mm (previously 6 mm). No ventriculomegaly. And no significant intraventricular hemorrhage despite continued basilar cistern predominant subarachnoid blood which is best demonstrated on FLAIR and DWI. Scattered small foci of restricted diffusion are redemonstrated in the right hemisphere were new and/or increased cortical  restricted diffusion in the posterior right frontal and right parietal lobes near the perirolandic cortex on series 3, image 45 today. Compare to series 5, image 97 previously. Conspicuous restricted diffusion along the ventral right thalamus and right cauda thalamic groove has not significantly changed. No convincing abnormal diffusion in the left hemisphere or posterior fossa, and no other areas of significantly progressed diffusion abnormality. Associated gyriform cytotoxic edema in the right perirolandic cortex. No malignant hemorrhagic transformation identified. Negative cervicomedullary junction and pituitary. Vascular: Major intracranial vascular flow voids appears stable. Skull and upper cervical spine: Negative visible cervical spine, bone marrow signal. Sinuses/Orbits: Negative orbits. Stable mild paranasal sinus mucosal thickening. Other: Stable trace mastoid fluid. MRA HEAD FINDINGS Intracranial MRA today is more motion degraded in addition to some vessel obscuration due to the T1 intrinsic subarachnoid blood. Antegrade flow appears stable in the distal vertebral arteries and basilar. No convincing vertebrobasilar stenosis. As before fetal type PCA origins are suspected, more so the left. Largely obscured PCA branch detail today. Antegrade flow in both ICA siphons appears stable. Both carotid termini appear patent. The right ACA A1 appears to be dominant as before. Both MCA M1 segments are patent. But bilateral MCA and ACA branch detail is largely obscured today. IMPRESSION: 1. Mildly increased Left Subdural Hematoma since 01/19/2020, now up to 9 mm in thickness. Smaller right side subdural hematoma has regressed, now trace. 2. Associated mildly increased rightward midline shift, now 7 mm. 3. Stable moderate volume basilar cistern predominant Subarachnoid Hemorrhage with no significant intraventricular hemorrhage and no ventriculomegaly. 4. Increased right hemisphere superior peri-Rolandic infarcts since  01/19/2020. Cytotoxic edema with no associated parenchymal hemorrhage or mass effect. 5. Otherwise stable multifocal right hemisphere ischemia, including at the right caudothalamic groove. 6. Intracranial MRA is more motion degraded today, but flow within the large vessels appear stable since 01/19/2020. Largely obscured second order and distal branches today. Electronically Signed   By: Odessa Fleming M.D.   On: 01/22/2020 15:22   MR BRAIN WO CONTRAST  Result Date: 01/22/2020 CLINICAL DATA:  69 year old male who presented with acute headache on 01/14/2020 found to have subarachnoid hemorrhage and severe intracranial Vasospasm which was treated with balloon angioplasty on 01/18/2020. No intracranial aneurysm has been identified. 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 Dense left hemiplegia now. EXAM: MRI HEAD WITHOUT CONTRAST MRA HEAD WITHOUT CONTRAST TECHNIQUE: Multiplanar, multiecho pulse sequences of the brain and surrounding structures were obtained without intravenous contrast. Angiographic images of the head were obtained using MRA technique without contrast. COMPARISON:  Brain MRI and intracranial MRA 01/19/2020 and earlier. FINDINGS: MRI HEAD FINDINGS Brain: Left side subdural hematoma measures up to 9 mm thickness now on coronal images, previously 6-7 mm). Unchanged signal within the left subdural blood products, mildly heterogeneous FLAIR signal within the left subdural hematoma. Trace right subdural blood mostly along the parietal and occipital lobes appears decreased. Rightward midline shift has increased to 7 mm (previously 6 mm). No ventriculomegaly. And no significant intraventricular hemorrhage despite continued basilar cistern predominant subarachnoid blood which is best demonstrated on FLAIR and DWI. Scattered small foci of restricted diffusion are redemonstrated in the right hemisphere were new  and/or increased cortical restricted diffusion in  the posterior right frontal and right parietal lobes near the perirolandic cortex on series 3, image 45 today. Compare to series 5, image 97 previously. Conspicuous restricted diffusion along the ventral right thalamus and right cauda thalamic groove has not significantly changed. No convincing abnormal diffusion in the left hemisphere or posterior fossa, and no other areas of significantly progressed diffusion abnormality. Associated gyriform cytotoxic edema in the right perirolandic cortex. No malignant hemorrhagic transformation identified. Negative cervicomedullary junction and pituitary. Vascular: Major intracranial vascular flow voids appears stable. Skull and upper cervical spine: Negative visible cervical spine, bone marrow signal. Sinuses/Orbits: Negative orbits. Stable mild paranasal sinus mucosal thickening. Other: Stable trace mastoid fluid. MRA HEAD FINDINGS Intracranial MRA today is more motion degraded in addition to some vessel obscuration due to the T1 intrinsic subarachnoid blood. Antegrade flow appears stable in the distal vertebral arteries and basilar. No convincing vertebrobasilar stenosis. As before fetal type PCA origins are suspected, more so the left. Largely obscured PCA branch detail today. Antegrade flow in both ICA siphons appears stable. Both carotid termini appear patent. The right ACA A1 appears to be dominant as before. Both MCA M1 segments are patent. But bilateral MCA and ACA branch detail is largely obscured today. IMPRESSION: 1. Mildly increased Left Subdural Hematoma since 01/19/2020, now up to 9 mm in thickness. Smaller right side subdural hematoma has regressed, now trace. 2. Associated mildly increased rightward midline shift, now 7 mm. 3. Stable moderate volume basilar cistern predominant Subarachnoid Hemorrhage with no significant intraventricular hemorrhage and no ventriculomegaly. 4. Increased right hemisphere superior peri-Rolandic infarcts since 01/19/2020. Cytotoxic  edema with no associated parenchymal hemorrhage or mass effect. 5. Otherwise stable multifocal right hemisphere ischemia, including at the right caudothalamic groove. 6. Intracranial MRA is more motion degraded today, but flow within the large vessels appear stable since 01/19/2020. Largely obscured second order and distal branches today. Electronically Signed   By: Odessa Fleming M.D.   On: 01/22/2020 15:22   DG CHEST PORT 1 VIEW  Result Date: 01/22/2020 CLINICAL DATA:  Hypoxia/respiratory failure EXAM: PORTABLE CHEST 1 VIEW COMPARISON:  Jan 20, 2020 FINDINGS: There is mild left base atelectasis. The lungs elsewhere are clear. There is cardiomegaly with pulmonary vascularity normal. No adenopathy. There is degenerative change in the thoracic spine. IMPRESSION: Slight left base atelectasis. Lungs elsewhere clear. Stable cardiac prominence. No adenopathy evident. Electronically Signed   By: Bretta Bang III M.D.   On: 01/22/2020 07:59   DG CHEST PORT 1 VIEW  Result Date: 01/20/2020 CLINICAL DATA:  Endotracheal tube placement. EXAM: PORTABLE CHEST 1 VIEW COMPARISON:  01/19/2020 FINDINGS: Left subclavian central venous catheter unchanged. No evidence of endotracheal tube. Lungs are somewhat hypoinflated with minimal left basilar opacification likely atelectasis. Mild stable cardiomegaly. Remainder the exam is unchanged. IMPRESSION: 1. Minimal left base opacification likely atelectasis. Mild stable cardiomegaly. 2. Left subclavian central venous catheter unchanged. No visualization of endotracheal tube. Electronically Signed   By: Elberta Fortis M.D.   On: 01/20/2020 10:45   VAS [place] TRANSCRANIAL DOPPLER  Result Date: 01/23/2020  Transcranial Doppler Indications: Subarachnoid hemorrhage. Performing Technologist: Jeb Levering RDMS, RVT  Examination Guidelines: A complete evaluation includes B-mode imaging, spectral Doppler, color Doppler, and power Doppler as needed of all accessible portions of each vessel. Bilateral  testing is considered an integral part of a complete examination. Limited examinations for reoccurring indications may be performed as noted.  +----------+-------------+----------+-----------+--------------+ RIGHT TCD Right VM (cm)Depth (cm)Pulsatility   Comment     +----------+-------------+----------+-----------+--------------+  MCA          194.00                 1.15                   +----------+-------------+----------+-----------+--------------+ ACA          -80.00                 0.54                   +----------+-------------+----------+-----------+--------------+ Term ICA                                    not visualized +----------+-------------+----------+-----------+--------------+ PCA           55.00                 1.27                   +----------+-------------+----------+-----------+--------------+ Opthalmic     28.00                 1.62                   +----------+-------------+----------+-----------+--------------+ ICA siphon    66.00                 1.03                   +----------+-------------+----------+-----------+--------------+ Vertebral    -37.00                 1.05                   +----------+-------------+----------+-----------+--------------+  +----------+------------+----------+-----------+--------------+ LEFT TCD  Left VM (cm)Depth (cm)Pulsatility   Comment     +----------+------------+----------+-----------+--------------+ MCA          81.00                 1.43                   +----------+------------+----------+-----------+--------------+ ACA          -87.00                1.21                   +----------+------------+----------+-----------+--------------+ PCA          34.00                 1.46                   +----------+------------+----------+-----------+--------------+ Opthalmic    26.00                  2.3                    +----------+------------+----------+-----------+--------------+ ICA siphon   80.00                 1.81                   +----------+------------+----------+-----------+--------------+ Vertebral                                  not visualized +----------+------------+----------+-----------+--------------+  +------------+-------+--------------+             VM  cm/s   Comment     +------------+-------+--------------+ Prox Basilar       not visualized +------------+-------+--------------+    Preliminary    VAS [place] TRANSCRANIAL DOPPLER  Result Date: 01/22/2020  Transcranial Doppler Indications: Subarachnoid hemorrhage. Comparison Study: 01/19/20 previous Performing Technologist: Blanch Media RVS Supporting Technologist: Sherren Kerns RVS  Examination Guidelines: A complete evaluation includes B-mode imaging, spectral Doppler, color Doppler, and power Doppler as needed of all accessible portions of each vessel. Bilateral testing is considered an integral part of a complete examination. Limited examinations for reoccurring indications may be performed as noted.  +----------+-------------+----------+-----------+-------+ RIGHT TCD Right VM (cm)Depth (cm)PulsatilityComment +----------+-------------+----------+-----------+-------+ MCA          127.00                 1.13            +----------+-------------+----------+-----------+-------+ ACA          -14.00                 1.22            +----------+-------------+----------+-----------+-------+ Term ICA     108.00                 1.54            +----------+-------------+----------+-----------+-------+ PCA           36.00                 1.35            +----------+-------------+----------+-----------+-------+ Opthalmic     47.00                 2.02            +----------+-------------+----------+-----------+-------+ ICA siphon    36.00                 1.13             +----------+-------------+----------+-----------+-------+ Vertebral    -42.00                 1.29            +----------+-------------+----------+-----------+-------+  +----------+------------+----------+-----------+-------+ LEFT TCD  Left VM (cm)Depth (cm)PulsatilityComment +----------+------------+----------+-----------+-------+ MCA          59.00                 1.37            +----------+------------+----------+-----------+-------+ Term ICA     43.00                 1.30            +----------+------------+----------+-----------+-------+ Opthalmic    34.00                 1.36            +----------+------------+----------+-----------+-------+ ICA siphon   103.00                1.10            +----------+------------+----------+-----------+-------+ Vertebral    -18.00                0.97            +----------+------------+----------+-----------+-------+  +------------+-------+-------+             VM cm/sComment +------------+-------+-------+ Prox Basilar-62.00         +------------+-------+-------+ Summary:  Mildly elevated right middlle crebral and bilateral carotid siphon mean flow velocities  indicate mild vasospasm. Normal mean flow velocities in remaining identified vessels of anterior and posterior cerebral circulations.Globally elevated pulsatility indices indicate increase in intracranial pressure likely. *See table(s) above for TCD measurements and observations.  Diagnosing physician: Delia Heady MD Electronically signed by Delia Heady MD on 01/22/2020 at 8:57:11 AM.    Final    {  Assessment and Plan:   WCT - EKG shows narrow QRS followed by brief run of WCT RBBB morphology (rates 90-100bmp) ?retrograde P waves and back to NSR - Tele shows frequent ectopy over the last 2 days (reportedly going on for a while), PVCs and intermittent WCT with RBBB morphology and intermittent VT (longest run 7 beats). - They tried switching patient off levophed  which didn't seem to help - check TSH - Echo 01/15/20 showed EF 70-75%, G1DD, normal RV function, mild aortic dilation noted - Patient is completely asymptomatic - Permissive hypertension on Nimodipine.  - Spoke with neuro, okay to try metoprolol 12.5mg  BID to reduce ectopy.  - keep K+>4 and Mag >2  SAH with vasospasm - management per neurology - recent MRI/MRA with no worsening vasospasm - allow for permissive hypertension, goal SBP>180, on vasopressin. On Nimodipine - On IVF - left hemiplegia - PT/OT/SLP  Prolonged Qt - EKG with QTc 564 ms - K+ goal >4 and Mag >2  Thrombocytopenia - concern for HIT - plt 224>127 - heparin held  For questions or updates, please contact [facility] Please consult [email or web address] for contact info under     Signed, Jeff Frieden David Stall, PA-C  01/23/2020 5:22 PM

## 2020-01-23 NOTE — Progress Notes (Signed)
STROKE TEAM PROGRESS NOTE   INTERVAL HISTORY RN and wife at bedside. Wife is sleeping in chair, did not wake her up. Pt awake alert and orientated. However, talking some stuff out of his head, consistent with mild delirium. His left UE and LE strength improved from yesterday. MRI showed mild extension of right MCA infarct and MRA stable.  TCD showed right MCA continued vasospasm but other vessels MFVs are WNL now.   OBJECTIVE Vitals:   01/23/20 0815 01/23/20 0830 01/23/20 0845 01/23/20 0900  BP: (!) 202/96 (!) 198/98 (!) 207/100 (!) 206/102  Pulse: 89 92 90 82  Resp: (!) 24 19 20  (!) 24  Temp:      TempSrc:      SpO2: 98% 97% 100% 99%  Weight:      Height:        CBC:  Recent Labs  Lab 01/22/20 0532 01/23/20 0530  WBC 20.2* 20.1*  NEUTROABS 15.4* 15.9*  HGB 16.5 15.2  HCT 49.3 44.5  MCV 87.3 86.9  PLT 164 127*    Basic Metabolic Panel:  Recent Labs  Lab 01/20/20 0201 01/21/20 0535 01/21/20 1515 01/22/20 0532 01/22/20 0902 01/23/20 0530  NA  --    < >  --  137  --  135  K  --    < >  --  3.7  --  3.3*  CL  --    < >  --  102  --  97*  CO2  --    < >  --  27  --  26  GLUCOSE  --    < >  --  167*  --  178*  BUN  --    < >  --  23  --  22  CREATININE  --    < >  --  0.78  --  0.73  CALCIUM  --    < >  --  8.4*  --  8.2*  MG 1.9   < >   < >  --  1.8 1.8  PHOS 1.2*  --   --  2.7  --   --    < > = values in this interval not displayed.   Lipid Panel:     Component Value Date/Time   CHOL 88 01/19/2020 0456   TRIG 124 01/19/2020 0456   HDL 26 (L) 01/19/2020 0456   CHOLHDL 3.4 01/19/2020 0456   VLDL 25 01/19/2020 0456   LDLCALC 37 01/19/2020 0456   HgbA1c:  Lab Results  Component Value Date   HGBA1C 7.7 (H) 01/14/2020   Urine Drug Screen:     Component Value Date/Time   LABOPIA NONE DETECTED 01/19/2020 1058   COCAINSCRNUR NONE DETECTED 01/19/2020 1058   LABBENZ NONE DETECTED 01/19/2020 1058   AMPHETMU NONE DETECTED 01/19/2020 1058   THCU POSITIVE (A)  01/19/2020 1058   LABBARB NONE DETECTED 01/19/2020 1058     IMAGING past 24h MR ANGIO HEAD WO CONTRAST  Result Date: 01/22/2020 CLINICAL DATA:  69 year old male who presented with acute headache on 01/14/2020 found to have subarachnoid hemorrhage and severe intracranial Vasospasm which was treated with balloon angioplasty on 01/18/2020. No intracranial aneurysm has been identified. 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 Dense left hemiplegia now. EXAM: MRI HEAD WITHOUT CONTRAST MRA HEAD WITHOUT CONTRAST TECHNIQUE: Multiplanar, multiecho pulse sequences of the brain and surrounding structures were obtained without intravenous contrast. Angiographic images of the head were  obtained using MRA technique without contrast. COMPARISON:  Brain MRI and intracranial MRA 01/19/2020 and earlier. FINDINGS: MRI HEAD FINDINGS Brain: Left side subdural hematoma measures up to 9 mm thickness now on coronal images, previously 6-7 mm). Unchanged signal within the left subdural blood products, mildly heterogeneous FLAIR signal within the left subdural hematoma. Trace right subdural blood mostly along the parietal and occipital lobes appears decreased. Rightward midline shift has increased to 7 mm (previously 6 mm). No ventriculomegaly. And no significant intraventricular hemorrhage despite continued basilar cistern predominant subarachnoid blood which is best demonstrated on FLAIR and DWI. Scattered small foci of restricted diffusion are redemonstrated in the right hemisphere were new and/or increased cortical restricted diffusion in the posterior right frontal and right parietal lobes near the perirolandic cortex on series 3, image 45 today. Compare to series 5, image 97 previously. Conspicuous restricted diffusion along the ventral right thalamus and right cauda thalamic groove has not significantly changed. No convincing abnormal diffusion in the left hemisphere or  posterior fossa, and no other areas of significantly progressed diffusion abnormality. Associated gyriform cytotoxic edema in the right perirolandic cortex. No malignant hemorrhagic transformation identified. Negative cervicomedullary junction and pituitary. Vascular: Major intracranial vascular flow voids appears stable. Skull and upper cervical spine: Negative visible cervical spine, bone marrow signal. Sinuses/Orbits: Negative orbits. Stable mild paranasal sinus mucosal thickening. Other: Stable trace mastoid fluid. MRA HEAD FINDINGS Intracranial MRA today is more motion degraded in addition to some vessel obscuration due to the T1 intrinsic subarachnoid blood. Antegrade flow appears stable in the distal vertebral arteries and basilar. No convincing vertebrobasilar stenosis. As before fetal type PCA origins are suspected, more so the left. Largely obscured PCA branch detail today. Antegrade flow in both ICA siphons appears stable. Both carotid termini appear patent. The right ACA A1 appears to be dominant as before. Both MCA M1 segments are patent. But bilateral MCA and ACA branch detail is largely obscured today. IMPRESSION: 1. Mildly increased Left Subdural Hematoma since 01/19/2020, now up to 9 mm in thickness. Smaller right side subdural hematoma has regressed, now trace. 2. Associated mildly increased rightward midline shift, now 7 mm. 3. Stable moderate volume basilar cistern predominant Subarachnoid Hemorrhage with no significant intraventricular hemorrhage and no ventriculomegaly. 4. Increased right hemisphere superior peri-Rolandic infarcts since 01/19/2020. Cytotoxic edema with no associated parenchymal hemorrhage or mass effect. 5. Otherwise stable multifocal right hemisphere ischemia, including at the right caudothalamic groove. 6. Intracranial MRA is more motion degraded today, but flow within the large vessels appear stable since 01/19/2020. Largely obscured second order and distal branches today.  Electronically Signed   By: Genevie Ann M.D.   On: 01/22/2020 15:22   MR BRAIN WO CONTRAST  Result Date: 01/22/2020 CLINICAL DATA:  69 year old male who presented with acute headache on 01/14/2020 found to have subarachnoid hemorrhage and severe intracranial Vasospasm which was treated with balloon angioplasty on 01/18/2020. No intracranial aneurysm has been identified. 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 Dense left hemiplegia now. EXAM: MRI HEAD WITHOUT CONTRAST MRA HEAD WITHOUT CONTRAST TECHNIQUE: Multiplanar, multiecho pulse sequences of the brain and surrounding structures were obtained without intravenous contrast. Angiographic images of the head were obtained using MRA technique without contrast. COMPARISON:  Brain MRI and intracranial MRA 01/19/2020 and earlier. FINDINGS: MRI HEAD FINDINGS Brain: Left side subdural hematoma measures up to 9 mm thickness now on coronal images, previously 6-7 mm). Unchanged signal within the left subdural blood products, mildly heterogeneous  FLAIR signal within the left subdural hematoma. Trace right subdural blood mostly along the parietal and occipital lobes appears decreased. Rightward midline shift has increased to 7 mm (previously 6 mm). No ventriculomegaly. And no significant intraventricular hemorrhage despite continued basilar cistern predominant subarachnoid blood which is best demonstrated on FLAIR and DWI. Scattered small foci of restricted diffusion are redemonstrated in the right hemisphere were new and/or increased cortical restricted diffusion in the posterior right frontal and right parietal lobes near the perirolandic cortex on series 3, image 45 today. Compare to series 5, image 97 previously. Conspicuous restricted diffusion along the ventral right thalamus and right cauda thalamic groove has not significantly changed. No convincing abnormal diffusion in the left hemisphere or posterior fossa, and  no other areas of significantly progressed diffusion abnormality. Associated gyriform cytotoxic edema in the right perirolandic cortex. No malignant hemorrhagic transformation identified. Negative cervicomedullary junction and pituitary. Vascular: Major intracranial vascular flow voids appears stable. Skull and upper cervical spine: Negative visible cervical spine, bone marrow signal. Sinuses/Orbits: Negative orbits. Stable mild paranasal sinus mucosal thickening. Other: Stable trace mastoid fluid. MRA HEAD FINDINGS Intracranial MRA today is more motion degraded in addition to some vessel obscuration due to the T1 intrinsic subarachnoid blood. Antegrade flow appears stable in the distal vertebral arteries and basilar. No convincing vertebrobasilar stenosis. As before fetal type PCA origins are suspected, more so the left. Largely obscured PCA branch detail today. Antegrade flow in both ICA siphons appears stable. Both carotid termini appear patent. The right ACA A1 appears to be dominant as before. Both MCA M1 segments are patent. But bilateral MCA and ACA branch detail is largely obscured today. IMPRESSION: 1. Mildly increased Left Subdural Hematoma since 01/19/2020, now up to 9 mm in thickness. Smaller right side subdural hematoma has regressed, now trace. 2. Associated mildly increased rightward midline shift, now 7 mm. 3. Stable moderate volume basilar cistern predominant Subarachnoid Hemorrhage with no significant intraventricular hemorrhage and no ventriculomegaly. 4. Increased right hemisphere superior peri-Rolandic infarcts since 01/19/2020. Cytotoxic edema with no associated parenchymal hemorrhage or mass effect. 5. Otherwise stable multifocal right hemisphere ischemia, including at the right caudothalamic groove. 6. Intracranial MRA is more motion degraded today, but flow within the large vessels appear stable since 01/19/2020. Largely obscured second order and distal branches today. Electronically Signed    By: Odessa Fleming M.D.   On: 01/22/2020 15:22     Transcranial Doppler 01/16/2020 Elevated right middle cerebral and carotid siphon and left middle cerebral artery and carotid siphon mean flow velocities suggest mild vasospasm. Slight improvement compared with prior TCD study 01/14/20  01/19/2020 Improvement in vasospasm overall but persistent elevated right middle cerebral and left carotid siphon mean flow velocities suggestive of mild vasospasm.  01/21/2020 Mildly elevated right middlle crebral and bilateral carotid siphon mean flow velocities indicate mild vasospasm. Normal mean flow velocities in remaining identified vessels of anterior and posterior cerebral circulations.Globally elevated pulsatility indices indicate increase in intracranial pressure likely. 01/23/2020 pending  PHYSICAL EXAM    Temp:  [98.2 F (36.8 C)-100 F (37.8 C)] 98.2 F (36.8 C) (05/05 0800) Pulse Rate:  [70-103] 82 (05/05 0900) Resp:  [13-29] 24 (05/05 0900) BP: (138-216)/(59-171) 206/102 (05/05 0900) SpO2:  [88 %-100 %] 99 % (05/05 0900)  General - Well nourished, well developed, not in acute distress.  Ophthalmologic - fundi not visualized due to noncooperation.  Cardiovascular - Regular rate and rhythm.  Neuro - awake alert, eye open. Able to answer questions appropriately, orientated to  place and age, no dysarthria. B/l CN VI imcomplete gaze. Mild right ptosis, but PERRL. Visual field full. Left facial droop, tongue protrusion to the left. Left UE 3-/5 proximal and 3/5 distal, LLE 2/5 proximal and 2-/5 distal toe wiggling. RUE and RLE at least 4/5 and following commands. B/l babinski positive. Sensation subjectively symmetrical, coordination intact right FTN and gait not tested.   ASSESSMENT/PLAN Nicholas [name] is a 69 y.o. male with history of diabetes, migraines, herpes simplex virus 1, hypertension, hyperlipidemia, hepatitis A, and benign prostatic hypertrophy transported [facility] after  developing sudden onset of severe headache initially with mild left upper extremity drift but still able to carry on a conversation. Later he developed fixed right gaze deviation as well as increased left arm and left leg weakness but was still able to communicate and speak.  He did not receive IV t-PA due to ICH.  SAH with vasospasm - extensive angio-negative SAH with b/l ICA, MCA, BA vasospasm s/p angioplasty  Resultant left hemiplegia  CT head 4/26 - Large volume subarachnoid hemorrhage, right greater than left  CTA H/N 4/26 - 25% diameter stenosis proximal right internal carotid artery. 50% diameter stenosis proximal left internal carotid artery. Mild to moderate stenosis in the cavernous carotid bilaterally due to atherosclerotic calcification  CTA head 4/30 - Overall decreased volume of subarachnoid hemorrhage since 01/15/2020. Diminished flow within the right ICA with no enhancement visualized at and beyond the cavernous segment. Mild increased right M1 MCA narrowing and diminished distal right MCA branch filling. New narrowing of the left A1 ACA with severe stenosis near the origin. Persistent irregularity of the distal basilar artery with new superimposed severe stenosis.  IR - significant improvement in vessel caliber and distal territory perfusion after balloon angioplasty of the right internal carotid artery, right middle cerebral artery, and basilar artery  MRI head 5/1 - Multiple small acute infarcts the right cerebral hemisphere primarily involving the anterior greater than posterior circulations. Thin left larger than right subdural collections with stable mild rightward midline shift.   MRA head 5/1 - Improved flow within the intracranial right ICA compared to 01/18/2020 CTA. Appearance is now similar to 01/14/2020 CTA with persistent severe stenosis of the supraclinoid portion. Likely persistent left A1 ACA stenosis and distal basilar stenosis.  MRI head repeat 5/4 mildly  increased L SDH up to 37mm thick, R SDH now trace only. Mild increase in midline shift to 8mm. Stable basilar cistern SAH. Increased R superior per-rolandic infarcts w/ edema.   MRA head repeat 5/4 large vessel flow stable.  2D Echo - EF 70 - 75%. Grade I diastolic dysfunction (impaired relaxation).   Sars Corona Virus 2 - negative  LDL - 37  HgbA1c - 7.7  UDS - positive for THC  VTE prophylaxis - heparin subq changed to arixtra  aspirin 81 mg daily prior to admission, now on No antithrombotic  Therapy recommendations:  CIR  Disposition:  Pending  Palliative care following for supportive care, counseling and sx management  Stroke - due to vasospasm  CTA head 4/30 - Overall decreased volume of subarachnoid hemorrhage since 01/15/2020. Diminished flow within the right ICA with no enhancement visualized at and beyond the cavernous segment. Mild increased right M1 MCA narrowing and diminished distal right MCA branch filling. New narrowing of the left A1 ACA with severe stenosis near the origin. Persistent irregularity of the distal basilar artery with new superimposed severe stenosis.  IR - significant improvement in vessel caliber  and distal territory perfusion after balloon angioplasty of the right internal carotid artery, right middle cerebral artery, and basilar artery  MRI head 5/1 - Multiple small acute infarcts the right cerebral hemisphere primarily involving the anterior greater than posterior circulations. Thin left larger than right subdural collections with stable mild rightward midline shift.   MRA head 5/1 - Improved flow within the intracranial right ICA compared to 01/18/2020 CTA. Appearance is now similar to 01/14/2020 CTA with persistent severe stenosis of the supraclinoid portion. Likely persistent left A1 ACA stenosis and distal basilar stenosis.  MRI head repeat 5/4 mildly increased L SDH up to 9mm thick, R SDH now trace only. Mild increase in midline shift to 7mm.  Stable basilar cistern SAH. Increased R superior per-rolandic infarcts w/ edema.   MRA head repeat 5/4 large vessel flow stable.  TCD 4/28 - Elevated right middle cerebral and carotid siphon and left middle cerebral artery and carotid siphon mean flow velocities suggest mild vasospasm. Slight improvement compared with prior TCD study 01/14/20   TCD 5/1- improvement but still mild vasospasm  TCD 5/3 - mild right MCA and b/l ICA siphon vasospasm   TCD 5/5 - persistent right MCA vasospasm, but other's MFV normalized   BP goal 180-220  On Neo, vasopressin, IVF  completed albumin 4 doses  Change nimodipine 60mg  Q4h to 30mg  Q2h  BP 170-190s  Hypertension Triple H therepy  Home BP meds: Lisinopril  Current BP meds: Nimodipine   SBP goal - 180 - 220   On Neo and vasopressin  On IVF @125   Completed albumin  Hyperlipidemia  Home Lipid lowering medication: Lipitor 80 mg daily and Fenofibrate (Crestor 40 mg daily) Lovaza  LDL - 37, goal < 70  Current lipid lowering medication: Crestor 40 mg daily and Fenofibrate  Continue statin and fenofibrate at discharge  Diabetes  Home diabetic meds: insulin, glucotrol, Actos, Janumet, Xigduo  Current diabetic meds: levemir 35 bid, novolog 10 u tid  HgbA1c 7.7, goal < 7.0  SSI 0-24 q 4h  CBG monitoring  Hyperglycemia much improved  DM coordinator following - recommends increase novolog to 12 tid if post prandial glu > 180  Fever   Tmax 100.7 -> afebrile  Leukocytosis WBCs - 14.8->18.5->20.5->20.2->20.1   Urine culture 4/26 neg   CXR 5/1 - Cardiomegaly without acute abnormality of the lungs in AP portable projection.  UA - neg  blood culture NGTD   Other Stroke Risk Factors  Advanced age  ETOH use, advised to drink no more than 1 alcoholic beverage per day.  Migraines  Other Active Problems  Code status - Full code  Keppra seizure prophylaxis  Thrombocytosis PLT 164->127. HIT panel sent. Sq heparin  [facility]  Hospital day # 9  This patient is critically ill due to stroke, SAH with cerebral vasospasm, left hemiplegia and at significant risk of neurological worsening, death form cerebral edema, recurrent stroke, recurrent bleeding seizure, hydrocephalus. This patient's care requires constant monitoring of vital signs, hemodynamics, respiratory and cardiac monitoring, review of multiple databases, neurological assessment, discussion with family, other specialists and medical decision making of high complexity. I spent 35 minutes of neurocritical care time in the care of this patient.  Marvel PlanJindong Shonette Rhames, MD PhD Stroke Neurology 01/23/2020 9:11 AM  To contact Stroke Continuity provider, please refer to [email or web address]. After hours, contact General Neurology

## 2020-01-23 NOTE — Progress Notes (Addendum)
PULMONARY / CRITICAL CARE MEDICINE   NAME:  Nicholas [name], MRN:  [id number], DOB:  1951-07-15, LOS: 9 ADMISSION DATE:  01/14/2020, CONSULTATION DATE:  01/14/20 REFERRING MD: ED Physician, CHIEF COMPLAINT:  SAH  BRIEF HISTORY:    69 yo male who presented for 24 hours of progressive headache and nuchal rigidity with associated nausea, vomiting and confusion. Patient was transported by EMS to [facility] for imaging and transferred to [facility] for further evaluation.  Both CTA and catheter angiography negative for aneurysm due to presence of vasospasm. Severe symptomatic 3 vessel vasospasm and has responded reasonably well to chemical/mechanical angioplasty and hyperdynamic treatment.   SIGNIFICANT PAST MEDICAL HISTORY   He  has a past medical history of BPH (benign prostatic hypertrophy) (12/20/2011), Colon polyp, Deviated septum, Diabetes mellitus without complication ([place]), Hepatitis A, HSV-1 (herpes simplex virus 1) infection, Hyperlipidemia, Hypertension, and Migraine headache.  SIGNIFICANT EVENTS:  4/30  Diagnostic cerebral angiogram with balloon angioplasty 4/30 5/3: Pt is lying in bed in no acute distress, he is alert and oriented x3. Denies any acute complaints. Slowed responses but appropriate.  5/4: No acute events overnight. Pt is lying comfortably in bed in no acute distress. Oriented to self, place, time with delayed responses. Remains on two vasopressors for escalated map goal per neuro/neurosx  STUDIES:   Echo 4/27 1. Normal LV systolic function; proximal septal thickening; grade 1  diastolic dysfunction; mildly dilated aortic root; scerotic aortic valve.  2. Left ventricular ejection fraction, by estimation, is 70 to 75%. The  left ventricle has hyperdynamic function. The left ventricle has no  regional wall motion abnormalities. There is mild left ventricular  hypertrophy of the basal segment. Left  ventricular diastolic parameters are consistent with Grade I diastolic   dysfunction (impaired relaxation).  3. Right ventricular systolic function is normal. The right ventricular  size is normal.  4. The mitral valve is normal in structure. No evidence of mitral valve  regurgitation. No evidence of mitral stenosis.  5. The aortic valve is tricuspid. Aortic valve regurgitation is not  visualized. Mild to moderate aortic valve sclerosis/calcification is  present, without any evidence of aortic stenosis.  6. Aortic dilatation noted. There is mild dilatation of the aortic root  measuring 38 mm.  7. The inferior vena cava is normal in size with greater than 50%  respiratory variability, suggesting right atrial pressure of 3 mmHg.   Transcranial Doppler 4/28 Elevated right middle cerebral and carotid siphon and left middle cerebral artery and carotid siphon mean flow velocities suggest mild vasospasm.  Slight improvement compared with prior TCD study 01/14/20   CT ANGIO HEAD W OR WO CONTRAST 01/18/2020 IMPRESSION:   Overall decreased volume of subarachnoid hemorrhage since 01/15/2020.   New left subdural hygroma and subcentimeter rightward midline shift.   Resolution of hydrocephalus.   Further significantly diminished flow within the right ICA with no enhancement visualized at and beyond the cavernous segment.   Mild increased right M1 MCA narrowing and diminished distal right MCA branch filling.   New narrowing of the left A1 ACA with severe stenosis near the origin.   Persistent irregularity of the distal basilar artery with new superimposed severe stenosis.   IR PTA Vasospasm Initial IR PTA Vasospasm Add Diff IR ENDOVASC INTRACRANIAL INF OTHER THAN THROMBO ART INC DIAG ANGIO IR ENDOVASC INTRACRANIAL INF OTHER THAN THROMBO ART INC DIAG ANGIO EA ADD 01/18/2020 IMPRESSION:   Severe vasospasm involving the supraclinoid segments of the internal carotid arteries bilaterally and  the length of the basilar artery.   There is significant improvement  in vessel caliber and distal territory perfusion after balloon angioplasty of the right internal carotid artery, right middle cerebral artery, and basilar artery.   TCD 5/1: Improvement in vasospasm overall but persistent elevated right middle  cerebral and left carotid siphon mean flow velocities suggestive of mild  vasospasm.   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  Thin left larger than right subdural collections with stable mild rightward midline shift.  Residual subarachnoid hemorrhage likely similar to prior CT.  Suboptimal vascular evaluation due to intrinsic T1 hyperintensity of hemorrhage. Improved flow within the intracranial right ICA compared to 01/18/2020 CTA.   Appearance is now similar to 01/14/2020 CTA with persistent severe stenosis of the supraclinoid portion. Likely persistent left A1 ACA stenosis and distal basilar stenosis.  TCD 5/3: Mildly elevated right middlle crebral and bilateral carotid siphon mean  flow velocities indicate mild vasospasm. Normal mean flow velocities in  remaining identified vessels of anterior and posterior cerebral circulations.Globally elevated pulsatility indices indicate increase in intracranial pressure likely.   Transcranial Doppler 5/4  Mildly elevated right middlle crebral and bilateral carotid siphon mean  flow velocities indicate mild vasospasm. Normal mean flow velocities in  remaining identified vessels of anterior and posterior cerebral circulations.Globally elevated pulsatility indices indicate increase in intracranial pressure likely.   5/4 MRA head/ MRI brain >> 1. Mildly increased Left Subdural Hematoma since 01/19/2020, now up to 9 mm in thickness. Smaller right side subdural hematoma has regressed, now trace. 2. Associated mildly increased rightward midline shift, now 7 mm. 3. Stable moderate volume basilar cistern  predominant Subarachnoid Hemorrhage with no significant intraventricular hemorrhage and no ventriculomegaly. 4. Increased right hemisphere superior peri-Rolandic infarcts since 01/19/2020. Cytotoxic edema with no associated parenchymal hemorrhage or mass effect. 5. Otherwise stable multifocal right hemisphere ischemia, including at the right caudothalamic groove. 6. Intracranial MRA is more motion degraded today, but flow within the large vessels appear stable since 01/19/2020. Largely obscured second order and distal branches today.  CULTURES:  COVID 4/26 > negative MRSA PCR 4/26 > negative  BCx2 4/26 >> neg Urine culture 4/26: neg Blood culture 5/2: NGTD  ANTIBIOTICS:  4/26 vanc x 1 4/26 cefepime x1  LINES/TUBES:  CVC triple lumen catheter (left subclavian) 01/18/20 Peripheral IV left wrist 01/18/20 A-line 4/30 >> 5/1 Foley urinary catheter  CONSULTANTS:  Neurology, neurosurgery  SUBJECTIVE:  Remains afebrile Frequent ventricular ectopy overnight Remains on Neo and vasopressin for SBP goals  Complains of pain all over with neck pain/headache.  Slightly more confused this morning per RN, talking about needing to program the bed and do coding to fix the water, as he is a Water quality scientist.   CONSTITUTIONAL: BP (!) 181/87   Pulse 92   Temp 98.7 F (37.1 C) (Axillary)   Resp (!) 21   Ht 6' (1.829 m)   Wt 96.5 kg   SpO2 92%   BMI 28.85 kg/m   I/O last 3 completed shifts: In: 8257.2 [P.O.:1200; I.V.:6821.3; IV Piggyback:236] Out: 6925 [Urine:6900; Stool:25]  CVP:  [0 mmHg-32 mmHg] 5 mmHg    PHYSICAL EXAM: General:  Pleasant adult male lying in bed in NAD HEENT: MM pink/moist, pupils 4/reactive Neuro: Oriented to month/ year, place and event but talking inappriotately at other times, 2/2 LUE/ LLE, 5/5 on right, minimal left facial droop CV: rr ir, no murmur PULM:  Non labored, CTA GI: soft, bs +, NT/ ND, condom cath Extremities: warm/dry, +1 LE edema, dependent edema  LUE Skin: no rashes   RESOLVED PROBLEM LIST  None  ASSESSMENT AND PLAN   Mr. Delfavero is a 69 yo with a history of hypertension, migraines, HSV-1, diabetes, HLD, hepatitis A, and BPH who was transported from [facility] after suddenly developing a severe headache and nuchal rigidity with associated left hemiplegia. CTA revealed large SAH in the basilar cisterns extending into bilateral sylvian fissures. 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 He experienced clinical vasospasm of basilar, MCA, and right ICA s/p angioplasty. He is currently on [facility] treatment.  #SAH with vasospasm - management per neurology - for MRA/MRI 5/4 showed no significant worsening of b/l ICA vasospasm with plans by NSGY to continue HHH therapy - hypertension: goal SBP > 180. On Neo, vasopressin.  Avoiding NE given increased ectopy   - Nimodipine 30mg  q2h. - hypervolemia: IVF NS @ 125 mL/hr, trend CVP - strict I/Os - hemodilution -continue frequent neuro checks. Maintain neuro protective measures including goal for normoxia, euglycemia, eunatremia, and eurothermia. - ongoing SLP, PT/ OT  #Low grade fevers - improved over last 2 days, Tmax 100. most likely due to atelectasis vs neurogenic fever associated with SAH. Neurogenic fever can result from a disruption in the hypothalamic set point temperature -> abnormal increase in body temperature associated with injury to hypothalamus. -WBC 20.5 (5/3) ->20.2 (5/4) -> 20.1 (5/5). Continue to trend with daily CBC. -Procal trend reasurring, 0.27- > 0.27 - ongoing aggressive pulm hygiene - intermittent CXR - monitor clinically for now   #Prolonged QT interval -EKG still with QTc 0.564 - Mag 2 gm and KCL 10 meq x 4 now for goal mag> 2, K >4 - avoid QTc prolonging meds  - recheck BMET this afternoon and Mag in am  - holding fenofibrate per pharmacy recs given it can affect QTc, (triglycerides  124 on 5/1)  #Glycemic control - SSI q 4 and levemir - goal CBG < 180 in ICU   #Thrombocytopenia - concern for HIT, heparin on 4/30 and restarted 5/3, 4H score 4 - PLT 224-> to now 127 - will hold heparin, send for HIT antibody and start fondaparinux for now  SUMMARY OF TODAY'S PLAN:  Electrolyte repletion Recheck BMP this afternoon  Ongoing supportive care    Best Practice / Goals of Care / Disposition.   Diet: heart healthy/ carb modified Pain/Anxiety/Delirium protocol (if indicated): PRNs VAP protocol (if indicated): Pulmonary hygiene  DVT prophylaxis: SCD, fondaparinux GI prophylaxis:PPI Glucose control: see above Mobility: Bedrest Code Status: Full FAMILY DISCUSSIONS: Per primary DISPOSITION: continue Neuro ICU care  LABS  Glucose Recent Labs  Lab 01/22/20 0754 01/22/20 1154 01/22/20 1528 01/22/20 1926 01/22/20 2307 01/23/20 0315  GLUCAP 215* 217* 137* 100* 113* 152*    BMET Recent Labs  Lab 01/21/20 1515 01/22/20 0532 01/23/20 0530  NA 135 137 135  K 3.4* 3.7 3.3*  CL 99 102 97*  CO2 25 27 26   BUN 21 23 22   CREATININE 0.80 0.78 0.73  GLUCOSE 186* 167* 178*    Liver Enzymes No results for input(s): AST, ALT, ALKPHOS, BILITOT, ALBUMIN in the last 168 hours.  Electrolytes Recent Labs  Lab 01/17/20 0250 01/19/20 0456 01/20/20 0201 01/21/20 0535 01/21/20 1515 01/22/20 0532 01/22/20 0902 01/23/20 0530  CALCIUM 8.3*   < >  --    < >  8.3* 8.4*  --  8.2*  MG 1.8  --  1.9  --  1.9  --  1.8 1.8  PHOS 2.8  --  1.2*  --   --  2.7  --   --    < > = values in this interval not displayed.    CBC Recent Labs  Lab 01/21/20 0535 01/22/20 0532 01/23/20 0530  WBC 20.5* 20.2* 20.1*  HGB 15.5 16.5 15.2  HCT 47.3 49.3 44.5  PLT 214 164 127*    ABG No results for input(s): PHART, PCO2ART, PO2ART in the last 168 hours.  Coag's No results for input(s): APTT, INR in the last 168 hours.  Sepsis Markers Recent Labs  Lab 01/21/20 1108  01/22/20 0532  PROCALCITON 0.27 0.27    Cardiac Enzymes No results for input(s): TROPONINI, PROBNP in the last 168 hours.   CCT: 35 mins  Posey Boyer, MSN, AGACNP-BC [facility] 01/23/2020, 7:39 AM  See Amion for personal pager PCCM on call pager [phone]

## 2020-01-24 DIAGNOSIS — I472 Ventricular tachycardia: Secondary | ICD-10-CM

## 2020-01-24 LAB — CBC WITH DIFFERENTIAL/PLATELET
Abs Immature Granulocytes: 0.27 10*3/uL — ABNORMAL HIGH (ref 0.00–0.07)
Basophils Absolute: 0.1 10*3/uL (ref 0.0–0.1)
Basophils Relative: 0 %
Eosinophils Absolute: 0 10*3/uL (ref 0.0–0.5)
Eosinophils Relative: 0 %
HCT: 40.8 % (ref 39.0–52.0)
Hemoglobin: 13.8 g/dL (ref 13.0–17.0)
Immature Granulocytes: 1 %
Lymphocytes Relative: 7 %
Lymphs Abs: 1.6 10*3/uL (ref 0.7–4.0)
MCH: 29.4 pg (ref 26.0–34.0)
MCHC: 33.8 g/dL (ref 30.0–36.0)
MCV: 86.8 fL (ref 80.0–100.0)
Monocytes Absolute: 1.9 10*3/uL — ABNORMAL HIGH (ref 0.1–1.0)
Monocytes Relative: 8 %
Neutro Abs: 20.4 10*3/uL — ABNORMAL HIGH (ref 1.7–7.7)
Neutrophils Relative %: 84 %
Platelets: 125 10*3/uL — ABNORMAL LOW (ref 150–400)
RBC: 4.7 MIL/uL (ref 4.22–5.81)
RDW: 13.6 % (ref 11.5–15.5)
WBC: 24.3 10*3/uL — ABNORMAL HIGH (ref 4.0–10.5)
nRBC: 0 % (ref 0.0–0.2)

## 2020-01-24 LAB — RENAL FUNCTION PANEL
Albumin: 2.6 g/dL — ABNORMAL LOW (ref 3.5–5.0)
Anion gap: 8 (ref 5–15)
BUN: 15 mg/dL (ref 8–23)
CO2: 28 mmol/L (ref 22–32)
Calcium: 8.2 mg/dL — ABNORMAL LOW (ref 8.9–10.3)
Chloride: 100 mmol/L (ref 98–111)
Creatinine, Ser: 0.66 mg/dL (ref 0.61–1.24)
GFR calc Af Amer: >60 mL/min (ref >60)
GFR calc non Af Amer: >60 mL/min (ref >60)
Glucose, Bld: 152 mg/dL — ABNORMAL HIGH (ref 70–99)
Phosphorus: 2.5 mg/dL (ref 2.5–4.6)
Potassium: 2.9 mmol/L — ABNORMAL LOW (ref 3.5–5.1)
Sodium: 136 mmol/L (ref 135–145)

## 2020-01-24 LAB — MAGNESIUM
Magnesium: 1.7 mg/dL (ref 1.7–2.4)
Magnesium: 1.9 mg/dL (ref 1.7–2.4)

## 2020-01-24 LAB — BASIC METABOLIC PANEL
Anion gap: 8 (ref 5–15)
BUN: 15 mg/dL (ref 8–23)
CO2: 28 mmol/L (ref 22–32)
Calcium: 8.1 mg/dL — ABNORMAL LOW (ref 8.9–10.3)
Chloride: 98 mmol/L (ref 98–111)
Creatinine, Ser: 0.78 mg/dL (ref 0.61–1.24)
GFR calc Af Amer: >60 mL/min (ref >60)
GFR calc non Af Amer: >60 mL/min (ref >60)
Glucose, Bld: 231 mg/dL — ABNORMAL HIGH (ref 70–99)
Potassium: 2.9 mmol/L — ABNORMAL LOW (ref 3.5–5.1)
Sodium: 134 mmol/L — ABNORMAL LOW (ref 135–145)

## 2020-01-24 LAB — GLUCOSE, CAPILLARY
Glucose-Capillary: 170 mg/dL — ABNORMAL HIGH (ref 70–99)
Glucose-Capillary: 133 mg/dL — ABNORMAL HIGH (ref 70–99)
Glucose-Capillary: 231 mg/dL — ABNORMAL HIGH (ref 70–99)
Glucose-Capillary: 244 mg/dL — ABNORMAL HIGH (ref 70–99)
Glucose-Capillary: 206 mg/dL — ABNORMAL HIGH (ref 70–99)
Glucose-Capillary: 209 mg/dL — ABNORMAL HIGH (ref 70–99)

## 2020-01-24 LAB — HEPARIN INDUCED PLATELET AB (HIT ANTIBODY): Heparin Induced Plt Ab: 0.292 OD (ref 0.000–0.400)

## 2020-01-24 MED ORDER — POTASSIUM CHLORIDE 10 MEQ/50ML IV SOLN
10.0000 meq | INTRAVENOUS | Status: AC
  Administered 2020-01-24 – 2020-01-25 (×6): 10 meq via INTRAVENOUS
  Filled 2020-01-24 (×6): qty 50

## 2020-01-24 MED ORDER — AMIODARONE LOAD VIA INFUSION
150.0000 mg | Freq: Once | INTRAVENOUS | Status: AC
  Administered 2020-01-24: 150 mg via INTRAVENOUS
  Filled 2020-01-24: qty 83.34

## 2020-01-24 MED ORDER — AMIODARONE HCL IN DEXTROSE 360-4.14 MG/200ML-% IV SOLN
30.0000 mg/h | INTRAVENOUS | Status: DC
  Administered 2020-01-24 – 2020-01-26 (×6): 30 mg/h via INTRAVENOUS
  Filled 2020-01-24 (×7): qty 200

## 2020-01-24 MED ORDER — MAGNESIUM SULFATE 2 GM/50ML IV SOLN
2.0000 g | Freq: Once | INTRAVENOUS | Status: AC
  Administered 2020-01-24: 2 g via INTRAVENOUS
  Filled 2020-01-24: qty 50

## 2020-01-24 MED ORDER — AMIODARONE HCL IN DEXTROSE 360-4.14 MG/200ML-% IV SOLN
60.0000 mg/h | INTRAVENOUS | Status: AC
  Administered 2020-01-24 (×2): 60 mg/h via INTRAVENOUS
  Filled 2020-01-24: qty 200

## 2020-01-24 MED ORDER — POTASSIUM CHLORIDE 10 MEQ/50ML IV SOLN
10.0000 meq | INTRAVENOUS | Status: AC
  Administered 2020-01-24 (×6): 10 meq via INTRAVENOUS
  Filled 2020-01-24 (×6): qty 50

## 2020-01-24 MED ORDER — INSULIN ASPART 100 UNIT/ML ~~LOC~~ SOLN
12.0000 [IU] | Freq: Three times a day (TID) | SUBCUTANEOUS | Status: DC
  Administered 2020-01-24 – 2020-01-30 (×10): 12 [IU] via SUBCUTANEOUS

## 2020-01-24 MED ORDER — MIDODRINE HCL 5 MG PO TABS
10.0000 mg | ORAL_TABLET | Freq: Three times a day (TID) | ORAL | Status: DC
  Administered 2020-01-24 – 2020-01-29 (×16): 10 mg via ORAL
  Filled 2020-01-24 (×15): qty 2

## 2020-01-24 MED ORDER — POTASSIUM CHLORIDE 20 MEQ/15ML (10%) PO SOLN
40.0000 meq | Freq: Once | ORAL | Status: AC
  Administered 2020-01-24: 40 meq via ORAL
  Filled 2020-01-24: qty 30

## 2020-01-24 NOTE — Progress Notes (Signed)
Physical Therapy Treatment Patient Details Name: Nicholas [name] MRN: [id number] DOB: [birth year] Today's Date: 01/24/2020    History of Present Illness 69 y.o. male admitted on 01/14/20 for HA and confusion found to have large volume SAH with bil ICA, MCA, BA vasospasm s/p angioplasty.  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 Pt also having runs of V-tach, so cardiology consulted.  Pt with significant PMH of DM, HTN, herpes simplex virus-1, hyperlipidemia, Hepatitis A.      PT Comments    Pt worked hard with PT today attempting to stand in stedy standing frame, however, despite having less left lateral pushing in sitting he had significantly more lateral pushing in standing today.  Attempted several different ways with different hand placement and it was harder to get him to shift to the right.  Wife present and engaging with her husband.  He remains significantly cognitively impaired.  He would benefit from intensive post acute therapy.  PT will continue to follow acutely for safe mobility progression.   Follow Up Recommendations  CIR     Equipment Recommendations  Wheelchair (measurements PT);Wheelchair cushion (measurements PT);Hospital bed    Recommendations for Other Services Rehab consult     Precautions / Restrictions Precautions Precautions: Fall Precaution Comments: SBP 180-220; Lt hemiparesis with Lt neglect     Mobility  Bed Mobility Overal bed mobility: Needs Assistance Bed Mobility: Supine to Sit;Sit to Supine     Supine to sit: Mod assist;HOB elevated;+2 for physical assistance Sit to supine: Max assist;+2 for physical assistance   General bed mobility comments: Mod two person assist to come to sitting on R side of the bed, pt using right rail with right hand heavily to pull up to sitting, assist needed to right trunk and to help scoot to EOB.  Assist also needed at left leg to progress over side of  bed.  More assist needed to return to supine to flat bed assiting at trunk and to lift both legs. Pt reported back pain with transition back to supine, but not up to sitting.   Transfers Overall transfer level: Needs assistance   Transfers: Sit to/from Stand Sit to Stand: Max assist;+2 physical assistance         General transfer comment: Two person max assist to stand up to stedy standing frame due to left lateral pushing.  Attempted to use bed rail to have him shift more to the right than using stedy standing frame bar, but unsuccessful.  Stood x 3                       Modified Rankin (Stroke Patients Only) Modified Rankin (Stroke Patients Only) Pre-Morbid Rankin Score: No symptoms Modified Rankin: Severe disability     Balance Overall balance assessment: Needs assistance Sitting-balance support: Feet supported;Bilateral upper extremity supported Sitting balance-Leahy Scale: Zero Sitting balance - Comments: up to max assist EOB due to left lateral pushing.  Pt did better if he could lean on his right elbow against maximally elevated HOB.   Postural control: Left lateral lean Standing balance support: Single extremity supported Standing balance-Leahy Scale: Zero Standing balance comment: max assist in standing due to increased left lateral push today.  I don't think the stedy standing frame made standing any easier.  May return to two person assist tomorrow.  Cognition Arousal/Alertness: Awake/alert Behavior During Therapy: Flat affect Overall Cognitive Status: Impaired/Different from baseline Area of Impairment: Attention;Following commands;Memory;Safety/judgement;Awareness                   Current Attention Level: Focused;Sustained Memory: Decreased short-term memory Following Commands: Follows one step commands inconsistently Safety/Judgement: Decreased awareness of safety;Decreased awareness of  deficits Awareness: Intellectual Problem Solving: Slow processing;Decreased initiation;Difficulty sequencing;Requires verbal cues General Comments: Pt continuing to report feeling "heavy" in standing vs weak on one side of his body.  Also continues to be unawre of his left lean.           General Comments General comments (skin integrity, edema, etc.): VSS, BP withthin allowable hypertensive parameters, continues to have arrythmias show up on tele monitor, but nothing sustained.       Pertinent Vitals/Pain Pain Assessment: Faces Faces Pain Scale: Hurts little more Pain Location: HA and lower back into legs Pain Descriptors / Indicators: Discomfort;Grimacing;Guarding Pain Intervention(s): Limited activity within patient's tolerance;Monitored during session;Repositioned           PT Goals (current goals can now be found in the care plan section) Acute Rehab PT Goals Patient Stated Goal: to get up on his feet Progress towards PT goals: Progressing toward goals    Frequency    Min 4X/week      PT Plan Current plan remains appropriate       AM-PAC PT "6 Clicks" Mobility   Outcome Measure  Help needed turning from your back to your side while in a flat bed without using bedrails?: A Lot Help needed moving from lying on your back to sitting on the side of a flat bed without using bedrails?: A Lot Help needed moving to and from a bed to a chair (including a wheelchair)?: Total Help needed standing up from a chair using your arms (e.g., wheelchair or bedside chair)?: Total Help needed to walk in hospital room?: Total Help needed climbing 3-5 steps with a railing? : Total 6 Click Score: 8    End of Session Equipment Utilized During Treatment: Gait belt Activity Tolerance: Patient limited by pain Patient left: in bed;with call bell/phone within reach;with family/visitor present   PT Visit Diagnosis: Muscle weakness (generalized) (M62.81);Hemiplegia and  hemiparesis Hemiplegia - Right/Left: Left Hemiplegia - dominant/non-dominant: Non-dominant Hemiplegia - caused by: Nontraumatic intracerebral hemorrhage     Time: 1327-1400 PT Time Calculation (min) (ACUTE ONLY): 33 min  Charges:  $Therapeutic Activity: 8-22 mins $Neuromuscular Re-education: 8-22 mins                     Verdene Lennert, PT, DPT  Acute Rehabilitation [phone] pager [phone]) [phone] office

## 2020-01-24 NOTE — Progress Notes (Addendum)
Progress Note  Patient Name: Nicholas RalphsGary S Lippens Date of Encounter: 01/24/2020  Primary Cardiologist:  Chrystie NoseKenneth C Hilty, MD  Subjective   Pt not aware of VT, denies CP, palpitations, presyncope. C/o HA and has neck pain at times  Inpatient Medications    Scheduled Meds: . bisacodyl  10 mg Oral Q0600  . Chlorhexidine Gluconate Cloth  6 each Topical Daily  . cholecalciferol  1,000 Units Oral Daily  . fondaparinux (ARIXTRA) injection  2.5 mg Subcutaneous Q0600  . insulin aspart  0-24 Units Subcutaneous Q4H  . insulin aspart  12 Units Subcutaneous TID WC  . insulin detemir  35 Units Subcutaneous BID  . levETIRAcetam  500 mg Oral BID  . magnesium hydroxide  30 mL Oral Daily  . metoprolol tartrate  12.5 mg Oral BID  . midodrine  10 mg Oral TID WC  . niMODipine  30 mg Oral Q2H   Or  . niMODipine  30 mg Oral Q2H  . rosuvastatin  40 mg Oral Daily  . senna-docusate  1 tablet Oral BID  . sodium chloride flush  10-40 mL Intracatheter Q12H   Continuous Infusions: . sodium chloride 0 mL/hr at 01/14/20 1126  . sodium chloride 125 mL/hr at 01/24/20 0900  . norepinephrine (LEVOPHED) Adult infusion 20 mcg/min (01/24/20 0900)  . phenylephrine (NEO-SYNEPHRINE) Adult infusion 400 mcg/min (01/24/20 0900)  . potassium chloride 10 mEq (01/24/20 1058)  . vasopressin (PITRESSIN) infusion - *FOR SHOCK* 0.04 Units/min (01/24/20 0900)   PRN Meds: sodium chloride, acetaminophen **OR** acetaminophen (TYLENOL) oral liquid 160 mg/5 mL **OR** acetaminophen, bisacodyl, HYDROmorphone (DILAUDID) injection, methocarbamol, oxyCODONE-acetaminophen, simethicone, sodium chloride flush   Vital Signs    Vitals:   01/24/20 0745 01/24/20 0800 01/24/20 0810 01/24/20 0900  BP: (!) 180/75 (!) 182/160  (!) 213/72  Pulse: (!) 111 86  (!) 105  Resp: 19 (!) 21  19  Temp:   99.7 F (37.6 C)   TempSrc:   Axillary   SpO2: 96% 95%  96%  Weight:      Height:        Intake/Output Summary (Last 24 hours) at 01/24/2020  1116 Last data filed at 01/24/2020 1043 Gross per 24 hour  Intake 4881.64 ml  Output 6400 ml  Net -1518.36 ml   Filed Weights   01/20/20 0446 01/21/20 0500 01/22/20 0500  Weight: 87.8 kg 90.3 kg 96.5 kg   Last Weight  Most recent update: 01/22/2020  5:38 AM   Weight  96.5 kg (212 lb 11.9 oz)           Weight change:    Telemetry    SR w/ frequent runs VT, from a few beats to minutes at a time - Personally Reviewed  ECG    Ordered for when he is in VT - Personally Reviewed  Physical Exam   General: Well developed, well nourished, male appearing in mild-moderate distress. Head: R eye ptosis  Neck: Supple without bruits, JVD not elevated. Lungs:  Resp regular and unlabored, CTA. Heart: RRR, S1, S2, no S3, S4, 2/6 murmur; no rub. Abdomen: Soft, non-tender, non-distended with normoactive bowel sounds. No hepatomegaly. No rebound/guarding. No obvious abdominal masses. Extremities: No clubbing, cyanosis, no edema. Distal pedal pulses are 2+ bilaterally. Neuro: Alert and oriented X 2. L side weakness, can wiggle toes  Labs    Hematology Recent Labs  Lab 01/22/20 0532 01/23/20 0530 01/24/20 0550  WBC 20.2* 20.1* 24.3*  RBC 5.65 5.12 4.70  HGB 16.5 15.2 13.8  HCT 49.3 44.5 40.8  MCV 87.3 86.9 86.8  MCH 29.2 29.7 29.4  MCHC 33.5 34.2 33.8  RDW 13.5 13.6 13.6  PLT 164 127* 125*    Chemistry Recent Labs  Lab 01/23/20 0530 01/23/20 1334 01/24/20 0550  NA 135 136 136  K 3.3* 3.4* 2.9*  CL 97* 100 100  CO2 26 27 28   GLUCOSE 178* 193* 152*  BUN 22 22 15   CREATININE 0.73 0.79 0.66  CALCIUM 8.2* 8.2* 8.2*  ALBUMIN  --   --  2.6*  GFRNONAA >60 >60 >60  GFRAA >60 >60 >60  ANIONGAP 12 9 8      High Sensitivity Troponin:  No results for input(s): TROPONINIHS in the last 720 hours.    BNPNo results for input(s): BNP, PROBNP in the last 168 hours.   Magnesium  Date Value Ref Range Status  01/24/2020 1.7 1.7 - 2.4 mg/dL Final    Comment:    Performed at [facility], [address] [place], [facility] [address]     Radiology    MR ANGIO HEAD WO CONTRAST  Result Date: 01/22/2020 CLINICAL DATA:  68 year old male who presented with acute headache on 01/14/2020 found to have subarachnoid hemorrhage and severe intracranial Vasospasm which was treated with balloon angioplasty on 01/18/2020. No intracranial aneurysm has been identified. 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 Dense left hemiplegia now. EXAM: MRI HEAD WITHOUT CONTRAST MRA HEAD WITHOUT CONTRAST TECHNIQUE: Multiplanar, multiecho pulse sequences of the brain and surrounding structures were obtained without intravenous contrast. Angiographic images of the head were obtained using MRA technique without contrast. COMPARISON:  Brain MRI and intracranial MRA 01/19/2020 and earlier. FINDINGS: MRI HEAD FINDINGS Brain: Left side subdural hematoma measures up to 9 mm thickness now on coronal images, previously 6-7 mm). Unchanged signal within the left subdural blood products, mildly heterogeneous FLAIR signal within the left subdural hematoma. Trace right subdural blood mostly along the parietal and occipital lobes appears decreased. Rightward midline shift has increased to 7 mm (previously 6 mm). No ventriculomegaly. And no significant intraventricular hemorrhage despite continued basilar cistern predominant subarachnoid blood which is best demonstrated on FLAIR and DWI. Scattered small foci of restricted diffusion are redemonstrated in the right hemisphere were new and/or increased cortical restricted diffusion in the posterior right frontal and right parietal lobes near the perirolandic cortex on series 3, image 45 today. Compare to series 5, image 97 previously. Conspicuous restricted diffusion along the ventral right thalamus and right cauda thalamic groove has not significantly changed. No convincing abnormal diffusion in the left hemisphere or  posterior fossa, and no other areas of significantly progressed diffusion abnormality. Associated gyriform cytotoxic edema in the right perirolandic cortex. No malignant hemorrhagic transformation identified. Negative cervicomedullary junction and pituitary. Vascular: Major intracranial vascular flow voids appears stable. Skull and upper cervical spine: Negative visible cervical spine, bone marrow signal. Sinuses/Orbits: Negative orbits. Stable mild paranasal sinus mucosal thickening. Other: Stable trace mastoid fluid. MRA HEAD FINDINGS Intracranial MRA today is more motion degraded in addition to some vessel obscuration due to the T1 intrinsic subarachnoid blood. Antegrade flow appears stable in the distal vertebral arteries and basilar. No convincing vertebrobasilar stenosis. As before fetal type PCA origins are suspected, more so the left. Largely obscured PCA branch detail today. Antegrade flow in both ICA siphons appears stable. Both carotid termini appear patent. The right ACA A1 appears to be dominant as before. Both MCA M1 segments are patent.  But bilateral MCA and ACA branch detail is largely obscured today. IMPRESSION: 1. Mildly increased Left Subdural Hematoma since 01/19/2020, now up to 9 mm in thickness. Smaller right side subdural hematoma has regressed, now trace. 2. Associated mildly increased rightward midline shift, now 7 mm. 3. Stable moderate volume basilar cistern predominant Subarachnoid Hemorrhage with no significant intraventricular hemorrhage and no ventriculomegaly. 4. Increased right hemisphere superior peri-Rolandic infarcts since 01/19/2020. Cytotoxic edema with no associated parenchymal hemorrhage or mass effect. 5. Otherwise stable multifocal right hemisphere ischemia, including at the right caudothalamic groove. 6. Intracranial MRA is more motion degraded today, but flow within the large vessels appear stable since 01/19/2020. Largely obscured second order and distal branches today.  Electronically Signed   By: Odessa Fleming M.D.   On: 01/22/2020 15:22   MR BRAIN WO CONTRAST  Result Date: 01/22/2020 CLINICAL DATA:  69 year old male who presented with acute headache on 01/14/2020 found to have subarachnoid hemorrhage and severe intracranial Vasospasm which was treated with balloon angioplasty on 01/18/2020. No intracranial aneurysm has been identified. 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 Dense left hemiplegia now. EXAM: MRI HEAD WITHOUT CONTRAST MRA HEAD WITHOUT CONTRAST TECHNIQUE: Multiplanar, multiecho pulse sequences of the brain and surrounding structures were obtained without intravenous contrast. Angiographic images of the head were obtained using MRA technique without contrast. COMPARISON:  Brain MRI and intracranial MRA 01/19/2020 and earlier. FINDINGS: MRI HEAD FINDINGS Brain: Left side subdural hematoma measures up to 9 mm thickness now on coronal images, previously 6-7 mm). Unchanged signal within the left subdural blood products, mildly heterogeneous FLAIR signal within the left subdural hematoma. Trace right subdural blood mostly along the parietal and occipital lobes appears decreased. Rightward midline shift has increased to 7 mm (previously 6 mm). No ventriculomegaly. And no significant intraventricular hemorrhage despite continued basilar cistern predominant subarachnoid blood which is best demonstrated on FLAIR and DWI. Scattered small foci of restricted diffusion are redemonstrated in the right hemisphere were new and/or increased cortical restricted diffusion in the posterior right frontal and right parietal lobes near the perirolandic cortex on series 3, image 45 today. Compare to series 5, image 97 previously. Conspicuous restricted diffusion along the ventral right thalamus and right cauda thalamic groove has not significantly changed. No convincing abnormal diffusion in the left hemisphere or posterior fossa, and  no other areas of significantly progressed diffusion abnormality. Associated gyriform cytotoxic edema in the right perirolandic cortex. No malignant hemorrhagic transformation identified. Negative cervicomedullary junction and pituitary. Vascular: Major intracranial vascular flow voids appears stable. Skull and upper cervical spine: Negative visible cervical spine, bone marrow signal. Sinuses/Orbits: Negative orbits. Stable mild paranasal sinus mucosal thickening. Other: Stable trace mastoid fluid. MRA HEAD FINDINGS Intracranial MRA today is more motion degraded in addition to some vessel obscuration due to the T1 intrinsic subarachnoid blood. Antegrade flow appears stable in the distal vertebral arteries and basilar. No convincing vertebrobasilar stenosis. As before fetal type PCA origins are suspected, more so the left. Largely obscured PCA branch detail today. Antegrade flow in both ICA siphons appears stable. Both carotid termini appear patent. The right ACA A1 appears to be dominant as before. Both MCA M1 segments are patent. But bilateral MCA and ACA branch detail is largely obscured today. IMPRESSION: 1. Mildly increased Left Subdural Hematoma since 01/19/2020, now up to 9 mm in thickness. Smaller right side subdural hematoma has regressed, now trace. 2. Associated mildly increased rightward midline shift, now 7 mm. 3. Stable  moderate volume basilar cistern predominant Subarachnoid Hemorrhage with no significant intraventricular hemorrhage and no ventriculomegaly. 4. Increased right hemisphere superior peri-Rolandic infarcts since 01/19/2020. Cytotoxic edema with no associated parenchymal hemorrhage or mass effect. 5. Otherwise stable multifocal right hemisphere ischemia, including at the right caudothalamic groove. 6. Intracranial MRA is more motion degraded today, but flow within the large vessels appear stable since 01/19/2020. Largely obscured second order and distal branches today. Electronically Signed    By: Odessa Fleming M.D.   On: 01/22/2020 15:22   DG CHEST PORT 1 VIEW  Result Date: 01/22/2020 CLINICAL DATA:  Hypoxia/respiratory failure EXAM: PORTABLE CHEST 1 VIEW COMPARISON:  Jan 20, 2020 FINDINGS: There is mild left base atelectasis. The lungs elsewhere are clear. There is cardiomegaly with pulmonary vascularity normal. No adenopathy. There is degenerative change in the thoracic spine. IMPRESSION: Slight left base atelectasis. Lungs elsewhere clear. Stable cardiac prominence. No adenopathy evident. Electronically Signed   By: Bretta Bang III M.D.   On: 01/22/2020 07:59   VAS [place] TRANSCRANIAL DOPPLER  Result Date: 01/24/2020  Transcranial Doppler Indications: Subarachnoid hemorrhage. Performing Technologist: Jeb Levering RDMS, RVT  Examination Guidelines: A complete evaluation includes B-mode imaging, spectral Doppler, color Doppler, and power Doppler as needed of all accessible portions of each vessel. Bilateral testing is considered an integral part of a complete examination. Limited examinations for reoccurring indications may be performed as noted.  +----------+-------------+----------+-----------+--------------+ RIGHT TCD Right VM (cm)Depth (cm)Pulsatility   Comment     +----------+-------------+----------+-----------+--------------+ MCA          194.00                 1.15                   +----------+-------------+----------+-----------+--------------+ ACA          -80.00                 0.54                   +----------+-------------+----------+-----------+--------------+ Term ICA                                    not visualized +----------+-------------+----------+-----------+--------------+ PCA           55.00                 1.27                   +----------+-------------+----------+-----------+--------------+ Opthalmic     28.00                 1.62                   +----------+-------------+----------+-----------+--------------+ ICA siphon    66.00                  1.03                   +----------+-------------+----------+-----------+--------------+ Vertebral    -37.00                 1.05                   +----------+-------------+----------+-----------+--------------+  +----------+------------+----------+-----------+--------------+ LEFT TCD  Left VM (cm)Depth (cm)Pulsatility   Comment     +----------+------------+----------+-----------+--------------+ MCA          81.00  1.43                   +----------+------------+----------+-----------+--------------+ ACA          -87.00                1.21                   +----------+------------+----------+-----------+--------------+ PCA          34.00                 1.46                   +----------+------------+----------+-----------+--------------+ Opthalmic    26.00                  2.3                   +----------+------------+----------+-----------+--------------+ ICA siphon   80.00                 1.81                   +----------+------------+----------+-----------+--------------+ Vertebral                                  not visualized +----------+------------+----------+-----------+--------------+  +------------+-------+--------------+             VM cm/s   Comment     +------------+-------+--------------+ Prox Basilar       not visualized +------------+-------+--------------+ Summary:  Elevated right middle cerebral artery mean flow velocities suggest moderate and worsening vasospasm. Mildly elevated left middle cerebral, and left carotid siphon mean flow velocities of unclear significance. *See table(s) above for TCD measurements and observations.  Diagnosing physician: Delia Heady MD Electronically signed by Delia Heady MD on 01/24/2020 at 8:15:01 AM.    Final    VAS [place] TRANSCRANIAL DOPPLER  Result Date: 01/22/2020  Transcranial Doppler Indications: Subarachnoid hemorrhage. Comparison Study: 01/19/20 previous Performing  Technologist: Blanch Media RVS Supporting Technologist: Sherren Kerns RVS  Examination Guidelines: A complete evaluation includes B-mode imaging, spectral Doppler, color Doppler, and power Doppler as needed of all accessible portions of each vessel. Bilateral testing is considered an integral part of a complete examination. Limited examinations for reoccurring indications may be performed as noted.  +----------+-------------+----------+-----------+-------+ RIGHT TCD Right VM (cm)Depth (cm)PulsatilityComment +----------+-------------+----------+-----------+-------+ MCA          127.00                 1.13            +----------+-------------+----------+-----------+-------+ ACA          -14.00                 1.22            +----------+-------------+----------+-----------+-------+ Term ICA     108.00                 1.54            +----------+-------------+----------+-----------+-------+ PCA           36.00                 1.35            +----------+-------------+----------+-----------+-------+ Opthalmic     47.00                 2.02            +----------+-------------+----------+-----------+-------+  ICA siphon    36.00                 1.13            +----------+-------------+----------+-----------+-------+ Vertebral    -42.00                 1.29            +----------+-------------+----------+-----------+-------+  +----------+------------+----------+-----------+-------+ LEFT TCD  Left VM (cm)Depth (cm)PulsatilityComment +----------+------------+----------+-----------+-------+ MCA          59.00                 1.37            +----------+------------+----------+-----------+-------+ Term ICA     43.00                 1.30            +----------+------------+----------+-----------+-------+ Opthalmic    34.00                 1.36            +----------+------------+----------+-----------+-------+ ICA siphon   103.00                1.10             +----------+------------+----------+-----------+-------+ Vertebral    -18.00                0.97            +----------+------------+----------+-----------+-------+  +------------+-------+-------+             VM cm/sComment +------------+-------+-------+ Prox Basilar-62.00         +------------+-------+-------+ Summary:  Mildly elevated right middlle crebral and bilateral carotid siphon mean flow velocities indicate mild vasospasm. Normal mean flow velocities in remaining identified vessels of anterior and posterior cerebral circulations.Globally elevated pulsatility indices indicate increase in intracranial pressure likely. *See table(s) above for TCD measurements and observations.  Diagnosing physician: Antony Contras MD Electronically signed by Antony Contras MD on 01/22/2020 at 8:57:11 AM.    Final      Cardiac Studies   ECHO:  01/15/2020 1. Normal LV systolic function; proximal septal thickening; grade 1  diastolic dysfunction; mildly dilated aortic root; scerotic aortic valve.  2. Left ventricular ejection fraction, by estimation, is 70 to 75%. The  left ventricle has hyperdynamic function. The left ventricle has no  regional wall motion abnormalities. There is mild left ventricular  hypertrophy of the basal segment. Left  ventricular diastolic parameters are consistent with Grade I diastolic  dysfunction (impaired relaxation).  3. Right ventricular systolic function is normal. The right ventricular  size is normal.  4. The mitral valve is normal in structure. No evidence of mitral valve  regurgitation. No evidence of mitral stenosis.  5. The aortic valve is tricuspid. Aortic valve regurgitation is not  visualized. Mild to moderate aortic valve sclerosis/calcification is  present, without any evidence of aortic stenosis.  6. Aortic dilatation noted. There is mild dilatation of the aortic root  measuring 38 mm.  7. The inferior vena cava is normal in size with  greater than 50%  respiratory variability, suggesting right atrial pressure of 3 mmHg.   Patient Profile     69 y.o. male w/ hx BPH, colon polyp, deviated septum, DM2, hepatitis A, HSV-1 infection, HLD, HTN, migraine headache was admitted 04/26 w/ SAH, clinical vasospasm of basilar, MCA and right ICA s/p angioplasty. Because of the vasospasm, permissive HTN w/ SBP goal 180-220.  Assessment & Plan    1. WCT: - HR when he is in it is high 90s - also has other WCT, different morphology - started on low dose metoprolol, but Neuro had to increase pressors because of this - ECG w/ partial capture of arrhythmia, review w/ MD - consider change to amio  2. Hypokalemia - to get 6 runs KCl today - recheck after they are completed - goal K+ level > 4.0 - may need to add K+ to baseline IVF (currently NS at 125 cc/hr)  3. Borderline hypomagnesemia - got 2 gm MGSO4 daily, except for 05/04 when got 4 gm - goal Mg level >2.0 - has been slowly trending down despite supplement - give another 2 gm now - recheck this pm, may need more  Otherwise, per NS, Neuro, CCM Active Problems:   Subarachnoid hemorrhage (HCC)   SAH (subarachnoid hemorrhage) (HCC)   Aneurysm (HCC)   Acute hypoxemic respiratory failure (HCC)   NSVT (nonsustained ventricular tachycardia) (HCC)   Rate-related bundle branch block    SignedTheodore Demark , PA-C 11:16 AM 01/24/2020 Pager: [phone]

## 2020-01-24 NOTE — Progress Notes (Addendum)
PULMONARY / CRITICAL CARE MEDICINE   NAME:  Nicholas [name], MRN:  [id number], DOB:  1950-10-07, LOS: 10 ADMISSION DATE:  01/14/2020, CONSULTATION DATE:  01/14/20 REFERRING MD: ED Physician, CHIEF COMPLAINT:  SAH  BRIEF HISTORY:    69 yo male who presented for 24 hours of progressive headache and nuchal rigidity with associated nausea, vomiting and confusion. Patient was transported by EMS to [facility] for imaging and transferred to [facility] for further evaluation.  Both CTA and catheter angiography negative for aneurysm due to presence of vasospasm. Severe symptomatic 3 vessel vasospasm and has responded reasonably well to chemical/mechanical angioplasty and hyperdynamic treatment.   SIGNIFICANT PAST MEDICAL HISTORY   He  has a past medical history of BPH (benign prostatic hypertrophy) (12/20/2011), Colon polyp, Deviated septum, Diabetes mellitus without complication (HCC), Hepatitis A, HSV-1 (herpes simplex virus 1) infection, Hyperlipidemia, Hypertension, and Migraine headache.  SIGNIFICANT EVENTS:  4/30 > Diagnostic cerebral angiogram with balloon angioplasty 4/30 5/3 > Pt is lying in bed in no acute distress, he is alert and oriented x3. Denies any acute complaints. Slowed responses but appropriate.  5/4 > No acute events overnight. Pt is lying comfortably in bed in no acute distress. Oriented to self, place, time with delayed responses. Remains on two vasopressors for escalated map goal per neuro/neurosx  STUDIES:   Echo 4/27 1. Normal LV systolic function; proximal septal thickening; grade 1  diastolic dysfunction; mildly dilated aortic root; scerotic aortic valve.  2. Left ventricular ejection fraction, by estimation, is 70 to 75%. The  left ventricle has hyperdynamic function. The left ventricle has no  regional wall motion abnormalities. There is mild left ventricular  hypertrophy of the basal segment. Left  ventricular diastolic parameters are consistent with Grade I diastolic   dysfunction (impaired relaxation).  3. Right ventricular systolic function is normal. The right ventricular  size is normal.  4. The mitral valve is normal in structure. No evidence of mitral valve  regurgitation. No evidence of mitral stenosis.  5. The aortic valve is tricuspid. Aortic valve regurgitation is not  visualized. Mild to moderate aortic valve sclerosis/calcification is  present, without any evidence of aortic stenosis.  6. Aortic dilatation noted. There is mild dilatation of the aortic root  measuring 38 mm.  7. The inferior vena cava is normal in size with greater than 50%  respiratory variability, suggesting right atrial pressure of 3 mmHg.   Transcranial Doppler 4/28 Elevated right middle cerebral and carotid siphon and left middle cerebral artery and carotid siphon mean flow velocities suggest mild vasospasm.  Slight improvement compared with prior TCD study 01/14/20   CT ANGIO HEAD W OR WO CONTRAST 01/18/2020  Overall decreased volume of subarachnoid hemorrhage since 01/15/2020.   New left subdural hygroma and subcentimeter rightward midline shift.   Resolution of hydrocephalus.   Further significantly diminished flow within the right ICA with no enhancement visualized at and beyond the cavernous segment.   Mild increased right M1 MCA narrowing and diminished distal right MCA branch filling.   New narrowing of the left A1 ACA with severe stenosis near the origin.   Persistent irregularity of the distal basilar artery with new superimposed severe stenosis.   IR PTA Vasospasm Initial IR PTA Vasospasm Add Diff IR ENDOVASC INTRACRANIAL INF OTHER THAN THROMBO ART INC DIAG ANGIO IR ENDOVASC INTRACRANIAL INF OTHER THAN THROMBO ART INC DIAG ANGIO EA ADD 01/18/2020  Severe vasospasm involving the supraclinoid segments of the internal carotid arteries bilaterally and the length  of the basilar artery.   There is significant improvement in vessel caliber and  distal territory perfusion after balloon angioplasty of the right internal carotid artery, right middle cerebral artery, and basilar artery.   TCD 5/1 Improvement in vasospasm overall but persistent elevated right middle  cerebral and left carotid siphon mean flow velocities suggestive of mild  vasospasm.   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  Thin left larger than right subdural collections with stable mild rightward midline shift.  Residual subarachnoid hemorrhage likely similar to prior CT.  Suboptimal vascular evaluation due to intrinsic T1 hyperintensity of hemorrhage. Improved flow within the intracranial right ICA compared to 01/18/2020 CTA.   Appearance is now similar to 01/14/2020 CTA with persistent severe stenosis of the supraclinoid portion. Likely persistent left A1 ACA stenosis and distal basilar stenosis.  TCD 5/3 Mildly elevated right middlle crebral and bilateral carotid siphon mean  flow velocities indicate mild vasospasm. Normal mean flow velocities in  remaining identified vessels of anterior and posterior cerebral circulations.Globally elevated pulsatility indices indicate increase in intracranial pressure likely.   TCD 5/4  Mildly elevated right middlle crebral and bilateral carotid siphon mean  flow velocities indicate mild vasospasm. Normal mean flow velocities in  remaining identified vessels of anterior and posterior cerebral circulations.Globally elevated pulsatility indices indicate increase in intracranial pressure likely.   5/4 MRA head/ MRI brain > 1. Mildly increased Left Subdural Hematoma since 01/19/2020, now up to 9 mm in thickness. Smaller right side subdural hematoma has regressed, now trace. 2. Associated mildly increased rightward midline shift, now 7 mm. 3. Stable moderate volume basilar cistern predominant Subarachnoid Hemorrhage with  no significant intraventricular hemorrhage and no ventriculomegaly. 4. Increased right hemisphere superior peri-Rolandic infarcts since 01/19/2020. Cytotoxic edema with no associated parenchymal hemorrhage or mass effect. 5. Otherwise stable multifocal right hemisphere ischemia, including at the right caudothalamic groove. 6. Intracranial MRA is more motion degraded today, but flow within the large vessels appear stable since 01/19/2020. Largely obscured second order and distal branches today.  TCD 5/5  Pending    CULTURES:  COVID 4/26 > negative MRSA PCR 4/26 > negative  BCx2 4/26 >> neg Urine culture 4/26: neg Blood culture 5/2: NGTD  ANTIBIOTICS:  4/26 vanc x 1 4/26 cefepime x1  LINES/TUBES:  CVC triple lumen catheter (left subclavian) 01/18/20 A-line 4/30 >> 5/1 Foley urinary catheter  CONSULTANTS:  Neurology, neurosurgery  SUBJECTIVE:  Sitting up in bed eating breakfast, reports low back pain worsened with passive leg lift. RN reports Continue frequent ventricular ectopy overnight.   CONSTITUTIONAL: BP 131/79   Pulse 100   Temp 98.7 F (37.1 C) (Axillary)   Resp (!) 25   Ht 6' (1.829 m)   Wt 96.5 kg   SpO2 95%   BMI 28.85 kg/m   I/O last 3 completed shifts: In: 6793.5 [P.O.:240; I.V.:6164.7; IV Piggyback:388.8] Out: 7700 [Urine:7700]  CVP:  [8 mmHg-13 mmHg] 8 mmHg    PHYSICAL EXAM: General: Pleasant middle aged gentleman sitting up in bed in NAD HEENT: [place]/AT, MM pink/moist, PERRL, right eye protosis  Neuro: Alert and oriented x3 with some underlying confusion seen, 5/5 strength bilaterally, 2/2 LUE and LLE CV: mild tachycardia, frequent ventricular ectopy, s1s2 regular rate and rhythm, no murmur, rubs, or gallops,  PULM:  Clear to ascultation bilaterally GI: soft, bowel sounds active in all 4 quadrants, non-tender, non-distended Extremities: warm/dry, 1+ pitting  edema  Skin: no rashes or lesions  RESOLVED PROBLEM LIST  None  ASSESSMENT AND PLAN    Mr. Cornfield is a 69 yo with a history of hypertension, migraines, HSV-1, diabetes, HLD, hepatitis A, and BPH who was transported from [facility] after suddenly developing a severe headache and nuchal rigidity with associated left hemiplegia. CTA revealed large SAH in the basilar cisterns extending into bilateral sylvian fissures. SAH are most frequently due to rupture of aneurysm; however, no aneurysm was visualized on CTA nor catheter angiogram. He experienced clinical vasospasm of basilar, MCA, and right ICA s/p angioplasty. He is currently on [facility] treatment.  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  -S/P diagnostic cerebral angiogram with balloon angioplasty  - MRA/MRI 5/4 showed no significant worsening of b/l ICA vasospasm with plans by NSGY to continue [facility] therapy P: Management per neurology/neurosurgery  Maintain neuro protective measures; goal for eurothermia, euglycemia, eunatermia, normoxia, Nutrition and bowel regiment  Seizure precautions  AEDs per neurology Hypertension goal SBP greater than 180, currently on Levo, Neo, Vaso, and Nimodipine Add Midodrine to assist in reaching SBP goal  Hypervolemia/Hemdilution with continuous NS at 143m/hr Trend CVP Frequent neuro checks  Strict I/O  Trend Bmets   Low grade fevers - No fever over the last 12hrs. Most likely due to atelectasis vs neurogenic fever associated with SAH. Neurogenic fever can result from a disruption in the hypothalamic set point temperature -> abnormal increase in body temperature associated with injury to hypothalamus. P: WBC 20.5 > 20.2 > 20.1 > 24.3, continue to trend CBC  Continued encouragement of aggressive pulmonary hygiene  Follow CXR as needed   Prolonged QT interval -EKG 5/6 with imporved QTc 436 -Holding fenofibrate per pharmacy recs given it can affect QTc, (triglycerides 124 on 5/1) P: Trend and supplement electrolytes, Mg  Provide 2gm Mg  and KCL 40meq x6  Trend EKGs  Insulin dependent type 2 diabetes  -Home medications include Tresibas and Levemir  -Hemoglobin A1C 01/14/2020 P: Continue resistant SSI with increased meal coverage  Continue Levemir  CBG goal < 180 during critical illness   Thrombocytopenia -Concern for HIT, heparin on 4/30 and restarted 5/3, 4H score 4 P: PLT 224 > 125  Follow HIT panel Fondaparinux started 5/5  Hyponatremia  -Potassium 2.9 5/6 P: Supplement IV potassium  Repeat Bmet later today  SUMMARY OF TODAY'S PLAN:  Supplement electrolytes again today with repeat BMET this afternoon.  Add midodrine today for assistance in hypertension   Best Practice / Goals of Care / Disposition.   Diet: heart healthy/ carb modified Pain/Anxiety/Delirium protocol (if indicated): PRNs VAP protocol (if indicated): Pulmonary hygiene  DVT prophylaxis: SCD, fondaparinux GI prophylaxis:PPI Glucose control: see above Mobility: Bedrest Code Status: Full FAMILY DISCUSSIONS: Per primary DISPOSITION: continue Neuro ICU care  LABS  Glucose Recent Labs  Lab 01/23/20 0745 01/23/20 1120 01/23/20 1550 01/23/20 1926 01/23/20 2317 01/24/20 0320  GLUCAP 226* 242* 180* 198* 159* 170*    BMET Recent Labs  Lab 01/23/20 0530 01/23/20 1334 01/24/20 0550  NA 135 136 136  K 3.3* 3.4* 2.9*  CL 97* 100 100  CO2 26 27 28   BUN 22 22 15   CREATININE 0.73 0.79 0.66  GLUCOSE 178* 193* 152*    Liver Enzymes Recent Labs  Lab 01/24/20 0550  ALBUMIN 2.6*    Electrolytes Recent Labs  Lab 01/20/20 0201 01/21/20 0535 01/21/20 1515 01/22/20 0532 01/22/20 0532 01/22/20 0902 01/23/20 0530 01/23/20 1334 01/24/20 0550  CALCIUM  --    < >  --  8.4*   < >  --  8.2* 8.2* 8.2*  MG 1.9   < >   < >  --   --  1.8 1.8  --  1.7  PHOS 1.2*  --   --  2.7  --   --   --   --  2.5   < > = values in this interval not displayed.    CBC Recent Labs  Lab 01/22/20 0532 01/23/20 0530 01/24/20 0550  WBC 20.2*  20.1* 24.3*  HGB 16.5 15.2 13.8  HCT 49.3 44.5 40.8  PLT 164 127* 125*    ABG No results for input(s): PHART, PCO2ART, PO2ART in the last 168 hours.  Coag's No results for input(s): APTT, INR in the last 168 hours.  Sepsis Markers Recent Labs  Lab 01/21/20 1108 01/22/20 0532  PROCALCITON 0.27 0.27    Cardiac Enzymes No results for input(s): TROPONINI, PROBNP in the last 168 hours.   CRITICAL CARE Performed by: Delfin Gant  Total critical care time:  Critical care time was exclusive of separately billable procedures and treating other patients.  Critical care was necessary to treat or prevent imminent or life-threatening deterioration.  Critical care was time spent personally by me on the following activities: development of treatment plan with patient and/or surrogate as well as nursing, discussions with consultants, evaluation of patient's response to treatment, examination of patient, obtaining history from patient or surrogate, ordering and performing treatments and interventions, ordering and review of laboratory studies, ordering and review of radiographic studies, pulse oximetry and re-evaluation of patient's condition.  Delfin Gant, NP-C [facility] Contact / Pager information can be found on Amion  01/24/2020, 8:02 AM

## 2020-01-24 NOTE — Consult Note (Signed)
Physical Medicine and Rehabilitation Consult Reason for Consult: Decreased functional ability with headache and altered mental status Referring Physician: Dr. Conchita Paris  HPI: Nicholas [name] is a 69 y.o. right-handed male with history of hypertension, diabetes mellitus, hepatitis, BPH.  History taken from chart review due to mentation.  Patient lives with wife independent prior to admission.  He presented on 01/14/2020 with nausea and headaches.  CT angiogram head and neck showed large volume acute subarachnoid hemorrhage.  Large amount of subarachnoid blood seen in the basilar cisterns and extending into the sylvian fissure bilaterally right greater than left.  Interhemispheric subarachnoid hemorrhage present.  Negative for hydrocephalus.  Patient underwent cerebral angiogram balloon angioplasty of right internal carotid artery right middle cerebral artery and balloon angioplasty of basilar artery.  Keppra started for seizure prophylaxis.  Echocardiogram with ejection fraction of 70% with grade 1 diastolic dysfunction.  Maintained on Nimotop for blood pressure control and vasospasms.  Cardiology was consulted on 01/23/2020 for wide-complex tachycardia.  Latest follow-up MRI of the brain personally reviewed, showing decreased left SDH.  Per report, mildly increased left subdural hematoma since 01/19/2020 now up to 9 mm in thickness.  Small right-sided subdural hematoma regressed.  Associated mildly increased rightward midline shift 7 mm.  Initially on subcutaneous heparin for DVT prophylaxis discontinued due to thrombocytopenia currently on Arixtra.  Palliative care has been consulted to establish goals of care.  He is tolerating a regular consistency diet.  Hospital course further complicated by tachypnea, labile blood glucose, hypertension, hypokalemia.  Therapy evaluations completed with recommendations of physical medicine rehab consult.  Review of Systems  Unable to perform ROS: Mental acuity   Past  Medical History:  Diagnosis Date  . BPH (benign prostatic hypertrophy) 12/20/2011   Dr. Cleatrice Burke  . Colon polyp    Dr. Vernell Barrier, it was recommended that he have a follow-up colonoscopy in 2010.  Marland Kitchen Deviated septum   . Diabetes mellitus without complication [facility])    Eye exam - 2011  . Hepatitis A   . HSV-1 (herpes simplex virus 1) infection   . Hyperlipidemia   . Hypertension   . Migraine headache    Past Surgical History:  Procedure Laterality Date  . IR ANGIO EXTERNAL CAROTID SEL EXT CAROTID BILAT MOD SED  01/15/2020  . IR ANGIO INTRA EXTRACRAN SEL INTERNAL CAROTID BILAT MOD SED  01/15/2020  . IR ANGIO VERTEBRAL SEL VERTEBRAL BILAT MOD SED  01/15/2020  . IR ENDOVASC INTRACRANIAL INF OTHER THAN THROMBO ART INC DIAG ANGIO  01/18/2020  . IR ENDOVASC INTRACRANIAL INF OTHER THAN THROMBO ART INC DIAG ANGIO EA ADD  01/18/2020  . IR PTA VASOSPASM ADD DIFF  01/18/2020  . IR PTA VASOSPASM INITIAL  01/18/2020  . NASAL SEPTUM SURGERY    . RADIOLOGY WITH ANESTHESIA N/A 01/15/2020   Procedure: IR WITH ANESTHESIA;  Surgeon: Lisbeth Renshaw, MD;  Location: [facility] OR;  Service: Radiology;  Laterality: N/A;  . RADIOLOGY WITH ANESTHESIA N/A 01/18/2020   Procedure: IR WITH ANESTHESIA;  Surgeon: Lisbeth Renshaw, MD;  Location: [facility] OR;  Service: Radiology;  Laterality: N/A;   Family History  Problem Relation Age of Onset  . Diabetes Mother   . Neuropathy Mother   . Diabetes Father   . Cancer Father        Lung Cancer  . Diabetes Sister   . Diabetes Brother    Social History:  reports that he has never smoked. He has never used smokeless tobacco. He reports  current alcohol use. He reports that he does not use drugs. Allergies: No Known Allergies Medications Prior to Admission  Medication Sig Dispense Refill  . aspirin (ASPIRIN ADULT LOW STRENGTH) 81 MG EC tablet Take 81 mg by mouth daily. Swallow whole.    Marland Kitchen atorvastatin (LIPITOR) 80 MG tablet Take 1 tablet (80 mg total) by mouth daily. 90 tablet 1  .  cholecalciferol (VITAMIN D3) 25 MCG (1000 UNIT) tablet Take 1,000 Units by mouth daily.    . CRESTOR 40 MG tablet Take 1 tablet (40 mg total) by mouth daily. 90 tablet 1  . fenofibrate 160 MG tablet Take 1 tablet (160 mg total) by mouth daily. 90 tablet 3  . insulin aspart (NOVOLOG) 100 UNIT/ML FlexPen Inject 75 Units into the skin daily.     . insulin degludec (TRESIBA) 200 UNIT/ML FlexTouch Pen Inject 35 Units into the skin daily.     . insulin detemir (LEVEMIR) 100 UNIT/ML FlexPen Inject 20 Units into the skin See admin instructions. Sliding scale : Takes 15 units if blood sugar more than 300. Max 20 units.    Marland Kitchen lisinopril (PRINIVIL,ZESTRIL) 20 MG tablet Take 1 tablet (20 mg total) by mouth daily. 90 tablet 3  . XIGDUO XR 06-999 MG TB24 Take 1 tablet by mouth daily.    Marland Kitchen glipiZIDE (GLUCOTROL XL) 10 MG 24 hr tablet Take 1 tablet (10 mg total) by mouth daily with breakfast. (Patient not taking: Reported on 01/14/2020) 90 tablet 1  . omega-3 acid ethyl esters (LOVAZA) 1 G capsule Take 2 capsules (2 g total) by mouth 2 (two) times daily. (Patient not taking: Reported on 01/14/2020) 360 capsule 1  . pioglitazone (ACTOS) 45 MG tablet Take 1 tablet (45 mg total) by mouth daily. (Patient not taking: Reported on 01/14/2020) 90 tablet 1  . sitaGLIPtin-metformin (JANUMET) 50-1000 MG per tablet Take 1 tablet by mouth 2 (two) times daily with a meal. (Patient not taking: Reported on 01/14/2020) 180 tablet 1    Home: Home Living Family/patient expects to be discharged to:: Private residence Living Arrangements: Spouse/significant other Available Help at Discharge: Family, Available 24 hours/day Type of Home: House Home Access: Stairs to enter Secretary/administrator of Steps: 3 Entrance Stairs-Rails: Right Home Layout: One level Bathroom Shower/Tub: Engineer, manufacturing systems: Standard Home Equipment: Environmental consultant - 2 wheels, Crutches, Bedside commode  Functional History: Prior Function Level of  Independence: Independent Comments: working as Dealer Status:  Mobility: Bed Mobility Overal bed mobility: Needs Assistance Bed Mobility: Supine to Sit, Sit to Supine Supine to sit: Mod assist, HOB elevated, +2 for physical assistance Sit to supine: Max assist, +2 for physical assistance General bed mobility comments: Mod two person assist to come to sitting on R side of the bed, pt using right rail with right hand heavily to pull up to sitting, assist needed to right trunk and to help scoot to EOB.  Assist also needed at left leg to progress over side of bed.  More assist needed to return to supine to flat bed assiting at trunk and to lift both legs. Pt reported back pain with transition back to supine, but not up to sitting.  Transfers Overall transfer level: Needs assistance Equipment used: 2 person hand held assist Transfer via Lift Equipment: Stedy Transfers: Sit to/from Stand Sit to Stand: Max assist, +2 physical assistance General transfer comment: Two person max assist to stand up to stedy standing frame due to left lateral pushing.  Attempted to use bed rail to  have him shift more to the right than using stedy standing frame bar, but unsuccessful.  Stood x 3 Ambulation/Gait Ambulation/Gait assistance: Land (Feet): 400 Feet Assistive device: None Gait Pattern/deviations: Step-through pattern General Gait Details: unable at this time  Gait velocity: functional Gait velocity interpretation: 1.31 - 2.62 ft/sec, indicative of limited community ambulator    ADL: ADL Overall ADL's : Needs assistance/impaired Eating/Feeding: NPO, Sitting Grooming: Wash/dry hands, Wash/dry face, Moderate assistance, Sitting, Brushing hair Grooming Details (indicate cue type and reason): assist for sitting balance, and to attend to Lt side of head and face  Upper Body Bathing: Maximal assistance, Sitting Lower Body Bathing: Total assistance, Bed level Upper Body  Dressing : Total assistance, Sitting Lower Body Dressing: Total assistance, Bed level Lower Body Dressing Details (indicate cue type and reason): pt able to don socks seated EOB, minA standing balance Toilet Transfer: Total assistance Toilet Transfer Details (indicate cue type and reason): unable to attempt  Toileting- Clothing Manipulation and Hygiene: Total assistance, Bed level Functional mobility during ADLs: Maximal assistance, +2 for physical assistance(bed mobility ) General ADL Comments: Pt requires assist for sitting balance, trunk control, cues/assist for neglect   Cognition: Cognition Overall Cognitive Status: Impaired/Different from baseline Arousal/Alertness: Awake/alert Orientation Level: Oriented to person, Oriented to place, Disoriented to time, Disoriented to situation Attention: Selective Selective Attention: Impaired Selective Attention Impairment: Verbal basic Memory: Impaired Memory Impairment: Storage deficit, Retrieval deficit, Decreased recall of new information Awareness: Impaired Awareness Impairment: Intellectual impairment, Emergent impairment Problem Solving: Impaired Problem Solving Impairment: Verbal complex Executive Function: Self Monitoring Self Monitoring: Impaired Self Monitoring Impairment: Functional basic Safety/Judgment: Impaired Comments: slow processing Cognition Arousal/Alertness: Awake/alert Behavior During Therapy: Flat affect Overall Cognitive Status: Impaired/Different from baseline Area of Impairment: Attention, Following commands, Memory, Safety/judgement, Awareness Current Attention Level: Focused, Sustained Memory: Decreased short-term memory Following Commands: Follows one step commands inconsistently Safety/Judgement: Decreased awareness of safety, Decreased awareness of deficits Awareness: Intellectual Problem Solving: Slow processing, Decreased initiation, Difficulty sequencing, Requires verbal cues General Comments: Pt  continuing to report feeling "heavy" in standing vs weak on one side of his body.  Also continues to be unawre of his left lean.    Blood pressure (!) 201/85, pulse 75, temperature 98.3 F (36.8 C), temperature source Oral, resp. rate (!) 22, height 6' (1.829 m), weight 96.5 kg, SpO2 96 %. Physical Exam  Vitals reviewed. Constitutional: He appears well-developed and well-nourished.  HENT:  Head: Normocephalic and atraumatic.  Eyes: Right eye exhibits no discharge. Left eye exhibits no discharge.  Right eye closed  Neck: No tracheal deviation present. No thyromegaly present.  Respiratory: Effort normal. No stridor. No respiratory distress.  GI: He exhibits distension.  Musculoskeletal:     Comments: LUE edema  Neurological: He is alert.  Alert Right gaze preference Left facial droop Right lean Motor: Limited due to ability to follow commands and attention Right sided minimal movement noted upper and lower extremities LUE/LLE: 0/5 Right ptosis  Skin: Skin is warm and dry.  Psychiatric:  Unable to assess due to mentation    Results for orders placed or performed during the hospital encounter of 01/14/20 (from the past 24 hour(s))  Glucose, capillary     Status: Abnormal   Collection Time: 01/24/20 12:33 PM  Result Value Ref Range   Glucose-Capillary 231 (H) 70 - 99 mg/dL  Glucose, capillary     Status: Abnormal   Collection Time: 01/24/20  3:59 PM  Result Value Ref Range   Glucose-Capillary  244 (H) 70 - 99 mg/dL  Basic metabolic panel     Status: Abnormal   Collection Time: 01/24/20  5:32 PM  Result Value Ref Range   Sodium 134 (L) 135 - 145 mmol/L   Potassium 2.9 (L) 3.5 - 5.1 mmol/L   Chloride 98 98 - 111 mmol/L   CO2 28 22 - 32 mmol/L   Glucose, Bld 231 (H) 70 - 99 mg/dL   BUN 15 8 - 23 mg/dL   Creatinine, Ser 0.78 0.61 - 1.24 mg/dL   Calcium 8.1 (L) 8.9 - 10.3 mg/dL   GFR calc non Af Amer >60 >60 mL/min   GFR calc Af Amer >60 >60 mL/min   Anion gap 8 5 - 15    Magnesium     Status: None   Collection Time: 01/24/20  5:32 PM  Result Value Ref Range   Magnesium 1.9 1.7 - 2.4 mg/dL  Glucose, capillary     Status: Abnormal   Collection Time: 01/24/20  6:57 PM  Result Value Ref Range   Glucose-Capillary 206 (H) 70 - 99 mg/dL  Glucose, capillary     Status: Abnormal   Collection Time: 01/24/20 11:14 PM  Result Value Ref Range   Glucose-Capillary 209 (H) 70 - 99 mg/dL  Glucose, capillary     Status: Abnormal   Collection Time: 01/25/20  3:12 AM  Result Value Ref Range   Glucose-Capillary 158 (H) 70 - 99 mg/dL  CBC with Differential/Platelet     Status: Abnormal   Collection Time: 01/25/20  5:44 AM  Result Value Ref Range   WBC 26.2 (H) 4.0 - 10.5 K/uL   RBC 4.18 (L) 4.22 - 5.81 MIL/uL   Hemoglobin 12.0 (L) 13.0 - 17.0 g/dL   HCT 36.0 (L) 39.0 - 52.0 %   MCV 86.1 80.0 - 100.0 fL   MCH 28.7 26.0 - 34.0 pg   MCHC 33.3 30.0 - 36.0 g/dL   RDW 13.7 11.5 - 15.5 %   Platelets 128 (L) 150 - 400 K/uL   nRBC 0.0 0.0 - 0.2 %   Neutrophils Relative % 89 %   Neutro Abs 23.5 (H) 1.7 - 7.7 K/uL   Lymphocytes Relative 4 %   Lymphs Abs 1.0 0.7 - 4.0 K/uL   Monocytes Relative 6 %   Monocytes Absolute 1.5 (H) 0.1 - 1.0 K/uL   Eosinophils Relative 0 %   Eosinophils Absolute 0.0 0.0 - 0.5 K/uL   Basophils Relative 0 %   Basophils Absolute 0.0 0.0 - 0.1 K/uL   Immature Granulocytes 1 %   Abs Immature Granulocytes 0.25 (H) 0.00 - 0.07 K/uL  Magnesium     Status: None   Collection Time: 01/25/20  5:44 AM  Result Value Ref Range   Magnesium 1.7 1.7 - 2.4 mg/dL  Basic metabolic panel     Status: Abnormal   Collection Time: 01/25/20  5:44 AM  Result Value Ref Range   Sodium 135 135 - 145 mmol/L   Potassium 3.2 (L) 3.5 - 5.1 mmol/L   Chloride 97 (L) 98 - 111 mmol/L   CO2 25 22 - 32 mmol/L   Glucose, Bld 130 (H) 70 - 99 mg/dL   BUN 13 8 - 23 mg/dL   Creatinine, Ser 0.65 0.61 - 1.24 mg/dL   Calcium 8.4 (L) 8.9 - 10.3 mg/dL   GFR calc non Af Amer >60 >60  mL/min   GFR calc Af Amer >60 >60 mL/min   Anion gap 13 5 -  15  Phosphorus     Status: None   Collection Time: 01/25/20  5:44 AM  Result Value Ref Range   Phosphorus 2.7 2.5 - 4.6 mg/dL  Glucose, capillary     Status: Abnormal   Collection Time: 01/25/20  7:30 AM  Result Value Ref Range   Glucose-Capillary 157 (H) 70 - 99 mg/dL  Glucose, capillary     Status: Abnormal   Collection Time: 01/25/20 10:58 AM  Result Value Ref Range   Glucose-Capillary 249 (H) 70 - 99 mg/dL   No results found.  Assessment/Plan: Diagnosis: Left > Right subdural hematoma  Stroke: Continue secondary stroke prophylaxis and Risk Factor Modification listed below:   Blood Pressure Management:  Continue current medication with prn's with permisive HTN per primary team Statin Agent:   Diabetes management:   L>R sided hemiparesis: fit for orthosis to prevent contractures (resting hand splint for day, wrist cock up splint at night, PRAFO, etc) PT/OT for mobility, ADL training  Motor recovery: Fluoxetine Labs and images (see above) independently reviewed.  Records reviewed and summated above.  1. Does the need for close, 24 hr/day medical supervision in concert with the patient's rehab needs make it unreasonable for this patient to be served in a less intensive setting? Yes 2. Co-Morbidities requiring supervision/potential complications: HTN (monitor and provide prns in accordance with increased physical exertion and pain), DM with hyperglycemia (Monitor in accordance with exercise and adjust meds as necessary), hepatitis (avoid hepatotoxic meds), BPH (PVRs to ensure no retention), abdominal distention (increase bowel meds, continue monitor hepatic function) 3. Due to bladder management, bowel management, safety, skin/wound care, disease management, medication administration, pain management and patient education, does the patient require 24 hr/day rehab nursing? Yes 4. Does the patient require coordinated care of a  physician, rehab nurse, therapy disciplines of PT/OT/SLP to address physical and functional deficits in the context of the above medical diagnosis(es)? Yes Addressing deficits in the following areas: balance, endurance, locomotion, strength, transferring, bowel/bladder control, bathing, dressing, feeding, grooming, toileting, cognition, speech, language and psychosocial support 5. Can the patient actively participate in an intensive therapy program of at least 3 hrs of therapy per day at least 5 days per week? Yes and Potentially 6. The potential for patient to make measurable gains while on inpatient rehab is excellent 7. Anticipated functional outcomes upon discharge from inpatient rehab are min assist  with PT, min assist with OT, supervision and min assist with SLP. 8. Estimated rehab length of stay to reach the above functional goals is: 27-30 days. 9. Anticipated discharge destination: Home 10. Overall Rehab/Functional Prognosis: good  RECOMMENDATIONS: This patient's condition is appropriate for continued rehabilitative care in the following setting: CIR when medically stable and able to tolerate 3 hours of therapy per day. Patient has agreed to participate in recommended program. Potentially Note that insurance prior authorization may be required for reimbursement for recommended care.  Comment: Rehab Admissions Coordinator to follow up.  I have personally performed a face to face diagnostic evaluation, including, but not limited to relevant history and physical exam findings, of this patient and developed relevant assessment and plan.  Additionally, I have reviewed and concur with the physician assistant's documentation above.   Maryla MorrowAnkit Shafiq Larch, MD, ABPMR Mcarthur Rossettianiel J Angiulli, PA-C 01/25/2020

## 2020-01-24 NOTE — Progress Notes (Signed)
STROKE TEAM PROGRESS NOTE   INTERVAL HISTORY Wife, RN and cardiology NP are at the bedside. Pt neuro stable, LUE 3-/5, LLE 2/5 withdraw to pain. Still has right ptosis. BP maintained with 3 BP pressors. Frequent PVCs with short run of Vtach. Cardiology on board, had dose of metoprolol but made BP drop, has to add 3rd pressor to keep BP stable.    OBJECTIVE Vitals:   01/24/20 0715 01/24/20 0730 01/24/20 0745 01/24/20 0810  BP: (!) 205/87 (!) 156/68 (!) 180/75   Pulse: 91 (!) 109 (!) 111   Resp: 18 (!) 23 19   Temp:    99.7 F (37.6 C)  TempSrc:    Axillary  SpO2: 97% 96% 96%   Weight:      Height:       CBC:  Recent Labs  Lab 01/23/20 0530 01/24/20 0550  WBC 20.1* 24.3*  NEUTROABS 15.9* 20.4*  HGB 15.2 13.8  HCT 44.5 40.8  MCV 86.9 86.8  PLT 127* 102*   Basic Metabolic Panel:  Recent Labs  Lab 01/22/20 0532 01/22/20 0902 01/23/20 0530 01/23/20 0530 01/23/20 1334 01/24/20 0550  NA 137   < > 135   < > 136 136  K 3.7   < > 3.3*   < > 3.4* 2.9*  CL 102   < > 97*   < > 100 100  CO2 27   < > 26   < > 27 28  GLUCOSE 167*   < > 178*   < > 193* 152*  BUN 23   < > 22   < > 22 15  CREATININE 0.78   < > 0.73   < > 0.79 0.66  CALCIUM 8.4*   < > 8.2*   < > 8.2* 8.2*  MG  --    < > 1.8  --   --  1.7  PHOS 2.7  --   --   --   --  2.5   < > = values in this interval not displayed.   Lipid Panel:     Component Value Date/Time   CHOL 88 01/19/2020 0456   TRIG 124 01/19/2020 0456   HDL 26 (L) 01/19/2020 0456   CHOLHDL 3.4 01/19/2020 0456   VLDL 25 01/19/2020 0456   LDLCALC 37 01/19/2020 0456   HgbA1c:  Lab Results  Component Value Date   HGBA1C 7.7 (H) 01/14/2020   Urine Drug Screen:     Component Value Date/Time   LABOPIA NONE DETECTED 01/19/2020 1058   COCAINSCRNUR NONE DETECTED 01/19/2020 1058   LABBENZ NONE DETECTED 01/19/2020 1058   AMPHETMU NONE DETECTED 01/19/2020 1058   THCU POSITIVE (A) 01/19/2020 1058   LABBARB NONE DETECTED 01/19/2020 1058      IMAGING past 24h VAS [place] TRANSCRANIAL DOPPLER  Result Date: 01/24/2020  Transcranial Doppler Indications: Subarachnoid hemorrhage. Performing Technologist: June Leap RDMS, RVT  Examination Guidelines: A complete evaluation includes B-mode imaging, spectral Doppler, color Doppler, and power Doppler as needed of all accessible portions of each vessel. Bilateral testing is considered an integral part of a complete examination. Limited examinations for reoccurring indications may be performed as noted.  +----------+-------------+----------+-----------+--------------+ RIGHT TCD Right VM (cm)Depth (cm)Pulsatility   Comment     +----------+-------------+----------+-----------+--------------+ MCA          194.00                 1.15                   +----------+-------------+----------+-----------+--------------+  ACA          -80.00                 0.54                   +----------+-------------+----------+-----------+--------------+ Term ICA                                    not visualized +----------+-------------+----------+-----------+--------------+ PCA           55.00                 1.27                   +----------+-------------+----------+-----------+--------------+ Opthalmic     28.00                 1.62                   +----------+-------------+----------+-----------+--------------+ ICA siphon    66.00                 1.03                   +----------+-------------+----------+-----------+--------------+ Vertebral    -37.00                 1.05                   +----------+-------------+----------+-----------+--------------+  +----------+------------+----------+-----------+--------------+ LEFT TCD  Left VM (cm)Depth (cm)Pulsatility   Comment     +----------+------------+----------+-----------+--------------+ MCA          81.00                 1.43                   +----------+------------+----------+-----------+--------------+ ACA           -87.00                1.21                   +----------+------------+----------+-----------+--------------+ PCA          34.00                 1.46                   +----------+------------+----------+-----------+--------------+ Opthalmic    26.00                  2.3                   +----------+------------+----------+-----------+--------------+ ICA siphon   80.00                 1.81                   +----------+------------+----------+-----------+--------------+ Vertebral                                  not visualized +----------+------------+----------+-----------+--------------+  +------------+-------+--------------+             VM cm/s   Comment     +------------+-------+--------------+ Prox Basilar       not visualized +------------+-------+--------------+ Summary:  Elevated right middle cerebral artery mean flow velocities suggest moderate and worsening vasospasm. Mildly elevated left middle cerebral, and left carotid siphon mean flow velocities  of unclear significance. *See table(s) above for TCD measurements and observations.  Diagnosing physician: Delia Heady MD Electronically signed by Delia Heady MD on 01/24/2020 at 8:15:01 AM.    Final     PHYSICAL EXAM     Temp:  [97.6 F (36.4 C)-99.7 F (37.6 C)] 99.7 F (37.6 C) (05/06 0810) Pulse Rate:  [70-127] 111 (05/06 0745) Resp:  [12-29] 19 (05/06 0745) BP: (131-223)/(52-150) 180/75 (05/06 0745) SpO2:  [92 %-99 %] 96 % (05/06 0745)  General - Well nourished, well developed, not in acute distress.  Ophthalmologic - fundi not visualized due to noncooperation.  Cardiovascular - Regular rate and rhythm.  Neuro - awake alert, eye open. Able to answer questions appropriately, orientated to place and age, no dysarthria. B/l CN VI imcomplete gaze. Mild right ptosis, but PERRL. Visual field full. Left facial droop, tongue protrusion to the left. Left UE 3-/5 proximal and 3/5 distal, LLE 2/5 proximal  on pain stimulation and 2-/5 distal toe wiggling. RUE and RLE at least 4/5 and following commands. B/l babinski positive. Sensation subjectively symmetrical, coordination intact right FTN and gait not tested.   ASSESSMENT/PLAN Nicholas [name] is a 69 y.o. male with history of diabetes, migraines, herpes simplex virus 1, hypertension, hyperlipidemia, hepatitis A, and benign prostatic hypertrophy transported from [facility] after developing sudden onset of severe headache initially with mild left upper extremity drift but still able to carry on a conversation. Later he developed fixed right gaze deviation as well as increased left arm and left leg weakness but was still able to communicate and speak.  He did not receive IV t-PA due to ICH.  SAH with vasospasm - extensive angio-negative SAH with b/l ICA, MCA, BA vasospasm s/p angioplasty  Resultant left hemiplegia  CT head 4/26 - Large volume subarachnoid hemorrhage, right greater than left  CTA H/N 4/26 - 25% diameter stenosis proximal right internal carotid artery. 50% diameter stenosis proximal left internal carotid artery. Mild to moderate stenosis in the cavernous carotid bilaterally due to atherosclerotic calcification  CTA head 4/30 - Overall decreased volume of subarachnoid hemorrhage since 01/15/2020. Diminished flow within the right ICA with no enhancement visualized at and beyond the cavernous segment. Mild increased right M1 MCA narrowing and diminished distal right MCA branch filling. New narrowing of the left A1 ACA with severe stenosis near the origin. Persistent irregularity of the distal basilar artery with new superimposed severe stenosis.  IR - significant improvement in vessel caliber and distal territory perfusion after balloon angioplasty of the right internal carotid artery, right middle cerebral artery, and basilar artery  MRI head 5/1 - Multiple small acute infarcts the right cerebral hemisphere primarily involving  the anterior greater than posterior circulations. Thin left larger than right subdural collections with stable mild rightward midline shift.   MRA head 5/1 - Improved flow within the intracranial right ICA compared to 01/18/2020 CTA. Appearance is now similar to 01/14/2020 CTA with persistent severe stenosis of the supraclinoid portion. Likely persistent left A1 ACA stenosis and distal basilar stenosis.  MRI head repeat 5/4 mildly increased L SDH up to 50mm thick, R SDH now trace only. Mild increase in midline shift to 55mm. Stable basilar cistern SAH. Increased R superior per-rolandic infarcts w/ edema.   MRA head repeat 5/4 large vessel flow stable.  2D Echo - EF 70 - 75%. Grade I diastolic dysfunction (impaired relaxation).   Sars Corona Virus 2 - negative  LDL - 37  HgbA1c - 7.7  UDS - positive for THC  VTE prophylaxis - heparin subq changed to arixtra  aspirin 81 mg daily prior to admission, now on No antithrombotic  Therapy recommendations:  CIR  Disposition:  Pending  Palliative care following for supportive care, counseling and sx management  Stroke - due to vasospasm  CTA head 4/30 - Overall decreased volume of subarachnoid hemorrhage since 01/15/2020. Diminished flow within the right ICA with no enhancement visualized at and beyond the cavernous segment. Mild increased right M1 MCA narrowing and diminished distal right MCA branch filling. New narrowing of the left A1 ACA with severe stenosis near the origin. Persistent irregularity of the distal basilar artery with new superimposed severe stenosis.  IR - significant improvement in vessel caliber and distal territory perfusion after balloon angioplasty of the right internal carotid artery, right middle cerebral artery, and basilar artery  MRI head 5/1 - Multiple small acute infarcts the right cerebral hemisphere primarily involving the anterior greater than posterior circulations. Thin left larger than right subdural  collections with stable mild rightward midline shift.   MRA head 5/1 - Improved flow within the intracranial right ICA compared to 01/18/2020 CTA. Appearance is now similar to 01/14/2020 CTA with persistent severe stenosis of the supraclinoid portion. Likely persistent left A1 ACA stenosis and distal basilar stenosis.  MRI head repeat 5/4 mildly increased L SDH up to 56mm thick, R SDH now trace only. Mild increase in midline shift to 8mm. Stable basilar cistern SAH. Increased R superior per-rolandic infarcts w/ edema.   MRA head repeat 5/4 large vessel flow stable.  TCD 4/28 - Elevated right middle cerebral and carotid siphon and left middle cerebral artery and carotid siphon mean flow velocities suggest mild vasospasm. Slight improvement compared with prior TCD study 01/14/20   TCD 5/1- improvement but still mild vasospasm  TCD 5/3 - mild right MCA and b/l ICA siphon vasospasm   TCD 5/5 - persistent right MCA vasospasm, but other's MFV normalized   BP goal 180-220  On Neo, vasopressin, IVF  completed albumin 4 doses  Change nimodipine 60mg  Q4h to 30mg  Q2h  Hypertension Triple H therepy  Home BP meds: Lisinopril  Current BP meds: Nimodipine   SBP goal - 180 - 220   On Levophed, Neo and vasopressin  On midodrine 10 tid, metoprolol 12.5 bid  On IVF @125    Completed albumin  Frequent PVCs and short Vtach run  Asymptomatic  Concerning cardiac burden with pressors and IVF  On metoprolol  Cardiology on board  Hyperlipidemia  Home Lipid lowering medication: Lipitor 80 mg daily and Fenofibrate (Crestor 40 mg daily) Lovaza  LDL - 37, goal < 70  Current lipid lowering medication: Crestor 40 mg daily and Fenofibrate  Continue statin and fenofibrate at discharge  Diabetes  Home diabetic meds: insulin, glucotrol, Actos, Janumet, Xigduo  Current diabetic meds: levemir 35 bid, novolog 12 u tid  HgbA1c 7.7, goal < 7.0  SSI 0-24 q 4h  CBG  monitoring  Hyperglycemia much improved  DM coordinator following   Fever   Tmax 100.7 -> afebrile  Leukocytosis WBCs - 14.8->18.5->20.5->20.2->20.1->24.3    Urine culture 4/26 neg   CXR 5/1 - Cardiomegaly without acute abnormality of the lungs in AP portable projection.  UA - neg  blood culture NGTD   Other Stroke Risk Factors  Advanced age  ETOH use, advised to drink no more than 1 alcoholic beverage per day.  Migraines  Other Active Problems  Code status - Full code  Keppra  seizure prophylaxis  Thrombocytosis PLT 164->12.7->125. HIT panel pending. Sq heparin changed to arixstra  Hypokalemia 3.3->3.4->2.9 - supplement  Wide complex tachycardia/ VT. Likely RBBB etiology. Added low dose metoprolol. Avoid watch QTc and avoid QTc prolonging agents. a  - cardiology on board  Hospital day # 10  This patient is critically ill due to stroke, SAH with cerebral vasospasm, left hemiplegia and at significant risk of neurological worsening, death form cerebral edema, recurrent stroke, recurrent bleeding seizure, hydrocephalus. This patient's care requires constant monitoring of vital signs, hemodynamics, respiratory and cardiac monitoring, review of multiple databases, neurological assessment, discussion with family, other specialists and medical decision making of high complexity. I spent 35 minutes of neurocritical care time in the care of this patient. I had long discussion with wife and cardiology PA at bedside, updated pt current condition, treatment plan and potential prognosis, and answered all the questions. She expressed understanding and appreciation. I also discussed with Dr. Aliene Altes, MD PhD Stroke Neurology 01/24/2020 8:51 AM  To contact Stroke Continuity provider, please refer to [email or web address]. After hours, contact General Neurology

## 2020-01-24 NOTE — Progress Notes (Signed)
Rehab Admissions Coordinator Note:  Noted pt has progressed to standing EOB with therapy. AC will contact attending service to request consult order.   Cheri Rous, OTR/L  Rehab Admissions Coordinator  [phone] 01/24/2020 1:42 PM

## 2020-01-24 NOTE — Progress Notes (Signed)
  NEUROSURGERY PROGRESS NOTE   Started on metoprolol yesterday by Cards for VT which resulted in addition of second pressor Complains of persistent HA and LBP  EXAM:  BP (!) 180/75   Pulse (!) 111   Temp 99.7 F (37.6 C) (Axillary)   Resp 19   Ht 6' (1.829 m)   Wt 96.5 kg   SpO2 96%   BMI 28.85 kg/m   Awake, alert, oriented  Speech fluent, appropriate, dysarthric Left facial droop, right CN3 palsy Moving RUE/RLE well Wiggles left toes, left fingers. Extends LLE with assistance.  IMPRESSION/PLAN 69 y.o. male SAH d#9 with improving left hemiparesis related to severe vasospasm - Continue to maintain hypertension SBP > - Continue Nimotop, supportive care, PT/OT - Appreciate management by Cards and PCCM

## 2020-01-25 ENCOUNTER — Encounter (HOSPITAL_COMMUNITY): Payer: Self-pay | Admitting: Anesthesiology

## 2020-01-25 ENCOUNTER — Inpatient Hospital Stay (HOSPITAL_COMMUNITY): Payer: BC Managed Care – PPO

## 2020-01-25 DIAGNOSIS — R140 Abdominal distension (gaseous): Secondary | ICD-10-CM

## 2020-01-25 DIAGNOSIS — Z794 Long term (current) use of insulin: Secondary | ICD-10-CM

## 2020-01-25 DIAGNOSIS — N400 Enlarged prostate without lower urinary tract symptoms: Secondary | ICD-10-CM

## 2020-01-25 DIAGNOSIS — K759 Inflammatory liver disease, unspecified: Secondary | ICD-10-CM

## 2020-01-25 DIAGNOSIS — E1165 Type 2 diabetes mellitus with hyperglycemia: Secondary | ICD-10-CM

## 2020-01-25 DIAGNOSIS — I10 Essential (primary) hypertension: Secondary | ICD-10-CM

## 2020-01-25 DIAGNOSIS — I609 Nontraumatic subarachnoid hemorrhage, unspecified: Secondary | ICD-10-CM

## 2020-01-25 LAB — BASIC METABOLIC PANEL
Anion gap: 13 (ref 5–15)
Anion gap: 10 (ref 5–15)
Anion gap: 11 (ref 5–15)
BUN: 13 mg/dL (ref 8–23)
BUN: 13 mg/dL (ref 8–23)
BUN: 9 mg/dL (ref 8–23)
CO2: 25 mmol/L (ref 22–32)
CO2: 27 mmol/L (ref 22–32)
CO2: 28 mmol/L (ref 22–32)
Calcium: 8.4 mg/dL — ABNORMAL LOW (ref 8.9–10.3)
Calcium: 8.3 mg/dL — ABNORMAL LOW (ref 8.9–10.3)
Calcium: 8 mg/dL — ABNORMAL LOW (ref 8.9–10.3)
Chloride: 97 mmol/L — ABNORMAL LOW (ref 98–111)
Chloride: 97 mmol/L — ABNORMAL LOW (ref 98–111)
Chloride: 94 mmol/L — ABNORMAL LOW (ref 98–111)
Creatinine, Ser: 0.65 mg/dL (ref 0.61–1.24)
Creatinine, Ser: 0.75 mg/dL (ref 0.61–1.24)
Creatinine, Ser: 0.62 mg/dL (ref 0.61–1.24)
GFR calc Af Amer: >60 mL/min (ref >60)
GFR calc Af Amer: >60 mL/min (ref >60)
GFR calc Af Amer: >60 mL/min (ref >60)
GFR calc non Af Amer: >60 mL/min (ref >60)
GFR calc non Af Amer: >60 mL/min (ref >60)
GFR calc non Af Amer: >60 mL/min (ref >60)
Glucose, Bld: 130 mg/dL — ABNORMAL HIGH (ref 70–99)
Glucose, Bld: 138 mg/dL — ABNORMAL HIGH (ref 70–99)
Glucose, Bld: 178 mg/dL — ABNORMAL HIGH (ref 70–99)
Potassium: 3.2 mmol/L — ABNORMAL LOW (ref 3.5–5.1)
Potassium: 3.4 mmol/L — ABNORMAL LOW (ref 3.5–5.1)
Potassium: 3.5 mmol/L (ref 3.5–5.1)
Sodium: 135 mmol/L (ref 135–145)
Sodium: 134 mmol/L — ABNORMAL LOW (ref 135–145)
Sodium: 133 mmol/L — ABNORMAL LOW (ref 135–145)

## 2020-01-25 LAB — GLUCOSE, CAPILLARY
Glucose-Capillary: 158 mg/dL — ABNORMAL HIGH (ref 70–99)
Glucose-Capillary: 157 mg/dL — ABNORMAL HIGH (ref 70–99)
Glucose-Capillary: 249 mg/dL — ABNORMAL HIGH (ref 70–99)
Glucose-Capillary: 146 mg/dL — ABNORMAL HIGH (ref 70–99)
Glucose-Capillary: 172 mg/dL — ABNORMAL HIGH (ref 70–99)
Glucose-Capillary: 188 mg/dL — ABNORMAL HIGH (ref 70–99)

## 2020-01-25 LAB — CBC WITH DIFFERENTIAL/PLATELET
Abs Immature Granulocytes: 0.25 10*3/uL — ABNORMAL HIGH (ref 0.00–0.07)
Basophils Absolute: 0 10*3/uL (ref 0.0–0.1)
Basophils Relative: 0 %
Eosinophils Absolute: 0 10*3/uL (ref 0.0–0.5)
Eosinophils Relative: 0 %
HCT: 36 % — ABNORMAL LOW (ref 39.0–52.0)
Hemoglobin: 12 g/dL — ABNORMAL LOW (ref 13.0–17.0)
Immature Granulocytes: 1 %
Lymphocytes Relative: 4 %
Lymphs Abs: 1 10*3/uL (ref 0.7–4.0)
MCH: 28.7 pg (ref 26.0–34.0)
MCHC: 33.3 g/dL (ref 30.0–36.0)
MCV: 86.1 fL (ref 80.0–100.0)
Monocytes Absolute: 1.5 10*3/uL — ABNORMAL HIGH (ref 0.1–1.0)
Monocytes Relative: 6 %
Neutro Abs: 23.5 10*3/uL — ABNORMAL HIGH (ref 1.7–7.7)
Neutrophils Relative %: 89 %
Platelets: 128 10*3/uL — ABNORMAL LOW (ref 150–400)
RBC: 4.18 MIL/uL — ABNORMAL LOW (ref 4.22–5.81)
RDW: 13.7 % (ref 11.5–15.5)
WBC: 26.2 10*3/uL — ABNORMAL HIGH (ref 4.0–10.5)
nRBC: 0 % (ref 0.0–0.2)

## 2020-01-25 LAB — CULTURE, BLOOD (ROUTINE X 2)
Culture: NO GROWTH
Culture: NO GROWTH
Special Requests: ADEQUATE
Special Requests: ADEQUATE

## 2020-01-25 LAB — PHOSPHORUS: Phosphorus: 2.7 mg/dL (ref 2.5–4.6)

## 2020-01-25 LAB — MAGNESIUM
Magnesium: 1.7 mg/dL (ref 1.7–2.4)
Magnesium: 1.8 mg/dL (ref 1.7–2.4)

## 2020-01-25 MED ORDER — MAGNESIUM SULFATE 4 GM/100ML IV SOLN
4.0000 g | Freq: Once | INTRAVENOUS | Status: AC
  Administered 2020-01-25: 11:00:00 4 g via INTRAVENOUS
  Filled 2020-01-25 (×2): qty 100

## 2020-01-25 MED ORDER — SODIUM CHLORIDE 0.9 % IV BOLUS
500.0000 mL | Freq: Once | INTRAVENOUS | Status: AC
  Administered 2020-01-25: 18:00:00 500 mL via INTRAVENOUS

## 2020-01-25 MED ORDER — INSULIN DETEMIR 100 UNIT/ML ~~LOC~~ SOLN
40.0000 [IU] | Freq: Every day | SUBCUTANEOUS | Status: DC
  Administered 2020-01-25 – 2020-01-30 (×6): 40 [IU] via SUBCUTANEOUS
  Filled 2020-01-25 (×7): qty 0.4

## 2020-01-25 MED ORDER — POTASSIUM CHLORIDE CRYS ER 20 MEQ PO TBCR
40.0000 meq | EXTENDED_RELEASE_TABLET | ORAL | Status: AC
  Administered 2020-01-25 (×2): 40 meq via ORAL
  Filled 2020-01-25 (×2): qty 2

## 2020-01-25 MED ORDER — POTASSIUM CHLORIDE CRYS ER 20 MEQ PO TBCR
40.0000 meq | EXTENDED_RELEASE_TABLET | Freq: Once | ORAL | Status: AC
  Administered 2020-01-26: 40 meq via ORAL
  Filled 2020-01-25: qty 2

## 2020-01-25 MED ORDER — POTASSIUM CHLORIDE IN NACL 20-0.9 MEQ/L-% IV SOLN
INTRAVENOUS | Status: DC
  Filled 2020-01-25 (×2): qty 1000
  Filled 2020-01-25: qty 2000
  Filled 2020-01-25 (×9): qty 1000

## 2020-01-25 MED ORDER — INSULIN DETEMIR 100 UNIT/ML ~~LOC~~ SOLN
35.0000 [IU] | Freq: Every day | SUBCUTANEOUS | Status: DC
  Administered 2020-01-25 – 2020-01-29 (×5): 35 [IU] via SUBCUTANEOUS
  Filled 2020-01-25 (×6): qty 0.35

## 2020-01-25 MED ORDER — MAGNESIUM SULFATE 2 GM/50ML IV SOLN
2.0000 g | Freq: Once | INTRAVENOUS | Status: AC
  Administered 2020-01-26: 2 g via INTRAVENOUS
  Filled 2020-01-25: qty 50

## 2020-01-25 NOTE — Progress Notes (Signed)
01/25/2020 PT/OT co session (overlapped and then OT stayed longer) for OOB mobility.  Pt tolerated session well with reports of generalized pain and discomfort.  He still has a hard time localizing that his left side is weak not just his whole body, continued R gaze and cognitive deficits.  His family is very engaged and assists with his sessions as needed.  His son, Marden Noble, and wife, Stanton Kidney, were both present during today's sessions.  I did not notice any runs of V-tach today and BP stayed within elevated parameters.  PT will continue to follow acutely for safe mobility progression.  Verdene Lennert, PT, DPT  Acute Rehabilitation [phone] pager [phone] office        01/25/20 1741  PT Visit Information  Last PT Received On 01/25/20  Assistance Needed +2  PT/OT/SLP Co-Evaluation/Treatment Yes  Reason for Co-Treatment Complexity of the patient's impairments (multi-system involvement);Necessary to address cognition/behavior during functional activity;For patient/therapist safety;To address functional/ADL transfers  PT goals addressed during session Mobility/safety with mobility;Balance;Strengthening/ROM  History of Present Illness 69 y.o. male admitted on 01/14/20 for HA and confusion found to have large volume SAH with bil ICA, MCA, BA vasospasm s/p angioplasty.  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 Pt also having runs of V-tach, so cardiology consulted.  Pt with significant PMH of DM, HTN, herpes simplex virus-1, hyperlipidemia, Hepatitis A.    Subjective Data  Patient Stated Goal to get up on his feet  Precautions  Precautions Fall  Precaution Comments Lt hemiparesis with Lt neglect   Restrictions  Weight Bearing Restrictions No  Pain Assessment  Pain Assessment Faces  Faces Pain Scale 4  Pain Location "It hurts everywhere"  Pain Descriptors / Indicators Discomfort;Grimacing;Guarding  Pain Intervention(s)  Limited activity within patient's tolerance;Monitored during session;Repositioned  Cognition  Arousal/Alertness Awake/alert  Behavior During Therapy Flat affect  Overall Cognitive Status Impaired/Different from baseline  Area of Impairment Attention;Following commands;Memory;Safety/judgement;Awareness  Current Attention Level Focused;Sustained  Memory Decreased short-term memory  Following Commands Follows one step commands inconsistently;Follows one step commands with increased time  Safety/Judgement Decreased awareness of safety;Decreased awareness of deficits  Awareness Intellectual  Problem Solving Slow processing;Decreased initiation;Difficulty sequencing;Requires verbal cues;Requires tactile cues  General Comments Decreased attention and requiring cues to sustain attention to task >90 seconds. Pt requiring increased cues for awareness and following commands.   Bed Mobility  Overal bed mobility Needs Assistance  Bed Mobility Supine to Sit;Sit to Supine  Supine to sit Mod assist;HOB elevated;+2 for physical assistance  General bed mobility comments Mod A +2 to bring LLE to EOB and then elevate trunk. Multimodal cues for sequencing.   Transfers  Overall transfer level Needs assistance  Transfer via [place] to/from [employer] Transfers  Sit to Stand Max assist;+2 physical assistance;Mod assist  Stand pivot transfers  (seated on the stedy)  General transfer comment Mod A +2 to power up into standing from elevated EOB. Max A at times for left lateral lean in standing. Sat on stedy to transfer OOB to the recliner chair.   Balance  Overall balance assessment Needs assistance  Sitting-balance support Feet supported;Bilateral upper extremity supported  Sitting balance-Leahy Scale Fair  Sitting balance - Comments Min Guard A for safety  Standing balance support Bilateral upper extremity supported;During functional activity  Standing balance-Leahy Scale  Poor  Standing balance comment Reliant on physical A and requiring cues for correcting left lateral lean, up to two  person max assist in standing frame.   General Comments  General comments (skin integrity, edema, etc.) VSS on RA . Son Gala Romney) and significant other Corrie Dandy) present throughout  PT - End of Session  Equipment Utilized During Treatment Gait belt  Activity Tolerance Patient limited by pain  Patient left in chair;with call bell/phone within reach;with chair alarm set  Nurse Communication Mobility status;Need for lift equipment   PT - Assessment/Plan  PT Plan Current plan remains appropriate  PT Visit Diagnosis Muscle weakness (generalized) (M62.81);Hemiplegia and hemiparesis  Hemiplegia - Right/Left Left  Hemiplegia - dominant/non-dominant Non-dominant  Hemiplegia - caused by Nontraumatic intracerebral hemorrhage  PT Frequency (ACUTE ONLY) Min 4X/week  Recommendations for Other Services Rehab consult  Follow Up Recommendations CIR  PT equipment Wheelchair (measurements PT);Wheelchair cushion (measurements PT);Hospital bed  AM-PAC PT "6 Clicks" Mobility Outcome Measure (Version 2)  Help needed turning from your back to your side while in a flat bed without using bedrails? 2  Help needed moving from lying on your back to sitting on the side of a flat bed without using bedrails? 2  Help needed moving to and from a bed to a chair (including a wheelchair)? 1  Help needed standing up from a chair using your arms (e.g., wheelchair or bedside chair)? 1  Help needed to walk in hospital room? 1  Help needed climbing 3-5 steps with a railing?  1  6 Click Score 8  Consider Recommendation of Discharge To: CIR/SNF/LTACH  PT Goal Progression  Progress towards PT goals Progressing toward goals  PT Time Calculation  PT Start Time (ACUTE ONLY) 1400  PT Stop Time (ACUTE ONLY) 1430  PT Time Calculation (min) (ACUTE ONLY) 30 min  PT General Charges  $$ ACUTE PT VISIT 1 Visit  PT Treatments   $Therapeutic Activity 8-22 mins  $Neuromuscular Re-education 8-22 mins

## 2020-01-25 NOTE — Progress Notes (Signed)
Inpatient Diabetes Program Recommendations  AACE/ADA: New Consensus Statement on Inpatient Glycemic Control (2015)  Target Ranges:  Prepandial:   less than 140 mg/dL      Peak postprandial:   less than 180 mg/dL (1-2 hours)      Critically ill patients:  140 - 180 mg/dL   Lab Results  Component Value Date   GLUCAP 157 (H) 01/25/2020   HGBA1C 7.7 (H) 01/14/2020    Review of Glycemic Control Results for ERVEN, RAMSON (MRN [id number]) as of 01/25/2020 09:57  Ref. Range 01/24/2020 08:08 01/24/2020 12:33 01/24/2020 15:59 01/24/2020 18:57 01/24/2020 23:14 01/25/2020 03:12 01/25/2020 07:30  Glucose-Capillary Latest Ref Range: 70 - 99 mg/dL 941 (H)  Novolog 12 units  Levemir 35 units 231 (H)  Novolog 20 units 244 (H)  Novolog 20 units 206 (H)  Novolog 8 units  Levemir 35 units 209 (H)  Novolog 8 units 158 (H)  Novolog 2 units 157 (H)  Novolog 2 units at 0755  12 units at 0905   Diabetes history: DM 2 Outpatient Diabetes medications: Tresiba 35 units Daily, Novolog 75 units Daily Current orders for Inpatient glycemic control:  Levemir 40 units qam, 35 units qpm Novolog 0-24 units Q4 hours Novolog 12 units tid  Inpatient Diabetes Program Recommendations:    Noted increase in Levemir am dose to 40 units.  -  May also consider increasing Novolog meal coverage to 15 units tid.  Thanks,  Christena Deem RN, MSN, BC-ADM Inpatient Diabetes Coordinator Team Pager [phone] (8a-5p)

## 2020-01-25 NOTE — Progress Notes (Signed)
  NEUROSURGERY PROGRESS NOTE   Pt seen and examined. No issues overnight. Reports improved HA this am.  EXAM: Temp:  [98.1 F (36.7 C)-100 F (37.8 C)] 98.1 F (36.7 C) (05/07 0800) Pulse Rate:  [73-108] 79 (05/07 0945) Resp:  [15-28] 24 (05/07 0945) BP: (89-225)/(50-138) 217/93 (05/07 0945) SpO2:  [90 %-98 %] 96 % (05/07 0945) Intake/Output      05/06 0701 - 05/07 0700 05/07 0701 - 05/08 0700   I.V. (mL/kg) 5920.1 (61.3)    IV Piggyback 604.7    Total Intake(mL/kg) 6524.8 (67.6)    Urine (mL/kg/hr) 7100 (3.1) 1450 (5.2)   Stool 0    Total Output 7100 1450   Net -575.2 -1450        Urine Occurrence 1 x    Stool Occurrence 2 x     Awake, alert, oriented Speech fluent, appropriate. Mildly dysarthric Moves RUE/RLE well 4-/5 proximal LUE, 3/5 grip 2/5 proximal LLE, moves foot   LABS: Lab Results  Component Value Date   CREATININE 0.65 01/25/2020   BUN 13 01/25/2020   NA 135 01/25/2020   K 3.2 (L) 01/25/2020   CL 97 (L) 01/25/2020   CO2 25 01/25/2020   Lab Results  Component Value Date   WBC 26.2 (H) 01/25/2020   HGB 12.0 (L) 01/25/2020   HCT 36.0 (L) 01/25/2020   MCV 86.1 01/25/2020   PLT 128 (L) 01/25/2020    IMPRESSION: - 69 y.o. male SAH d# 11, severe symptomatic vasospasm appears clinically somewhat improved.  - tachycardia related to vasopressor support appears to be improved on amiodarone gtt  PLAN: - Appreciate cardiology input, mgmt of tachycardia:  - electrolyte replacement for K>4.0 and Mg>2.0  - Amiodarone gtt - Maintain hypertension for now, will attempt to relax parameters early next week (14 days after ictus) - TCD today - Cont Nimotop - Cont therapy

## 2020-01-25 NOTE — Progress Notes (Signed)
Progress Note  Patient Name: Nicholas RalphsGary S [name] Date of Encounter: 01/25/2020  Primary Cardiologist:  Chrystie NoseKenneth C Renalda Locklin, MD  Subjective   Headache may be better - ventricular ectopy has improved on amiodarone, still with isolated PVC's. I suspect the levophed may be contributing to this as well as his systolic BP of 190 (permissive hypertension).  Inpatient Medications    Scheduled Meds: . bisacodyl  10 mg Oral Q0600  . Chlorhexidine Gluconate Cloth  6 each Topical Daily  . cholecalciferol  1,000 Units Oral Daily  . fondaparinux (ARIXTRA) injection  2.5 mg Subcutaneous Q0600  . insulin aspart  0-24 Units Subcutaneous Q4H  . insulin aspart  12 Units Subcutaneous TID WC  . insulin detemir  35 Units Subcutaneous QHS  . insulin detemir  40 Units Subcutaneous Daily  . levETIRAcetam  500 mg Oral BID  . magnesium hydroxide  30 mL Oral Daily  . midodrine  10 mg Oral TID WC  . niMODipine  30 mg Oral Q2H   Or  . niMODipine  30 mg Oral Q2H  . rosuvastatin  40 mg Oral Daily  . senna-docusate  1 tablet Oral BID  . sodium chloride flush  10-40 mL Intracatheter Q12H   Continuous Infusions: . sodium chloride 0 mL/hr at 01/14/20 1126  . 0.9 % NaCl with KCl 20 mEq / L 100 mL/hr at 01/25/20 1100  . amiodarone 30 mg/hr (01/25/20 1155)  . norepinephrine (LEVOPHED) Adult infusion 6 mcg/min (01/25/20 1100)  . phenylephrine (NEO-SYNEPHRINE) Adult infusion 400 mcg/min (01/25/20 1100)  . vasopressin (PITRESSIN) infusion - *FOR SHOCK* 0.04 Units/min (01/25/20 1154)   PRN Meds: sodium chloride, acetaminophen **OR** acetaminophen (TYLENOL) oral liquid 160 mg/5 mL **OR** acetaminophen, bisacodyl, HYDROmorphone (DILAUDID) injection, methocarbamol, oxyCODONE-acetaminophen, simethicone, sodium chloride flush   Vital Signs    Vitals:   01/25/20 1215 01/25/20 1230 01/25/20 1245 01/25/20 1300  BP: (!) 173/72 (!) 145/84 (!) 196/77 (!) 193/80  Pulse: 77 77 78 75  Resp: (!) 28 (!) 27 18 (!) 25  Temp:        TempSrc:      SpO2: 94% 94% 96% 93%  Weight:      Height:        Intake/Output Summary (Last 24 hours) at 01/25/2020 1307 Last data filed at 01/25/2020 1100 Gross per 24 hour  Intake 5534.65 ml  Output 7050 ml  Net -1515.35 ml   Filed Weights   01/20/20 0446 01/21/20 0500 01/22/20 0500  Weight: 87.8 kg 90.3 kg 96.5 kg   Last Weight  Most recent update: 01/22/2020  5:38 AM   Weight  96.5 kg (212 lb 11.9 oz)           Weight change:    Telemetry    Sinus with PVC's - Personally Reviewed  ECG    N/A  Physical Exam   General: Well developed, well nourished, male appearing in mild-moderate distress. Head: R eye ptosis  Neck: Supple without bruits, JVD not elevated. Lungs:  Resp regular and unlabored, CTA. Heart: RRR, S1, S2, no S3, S4, 2/6 murmur; no rub. Abdomen: Soft, non-tender, non-distended with normoactive bowel sounds. No hepatomegaly. No rebound/guarding. No obvious abdominal masses. Extremities: No clubbing, cyanosis, no edema. Distal pedal pulses are 2+ bilaterally. Neuro: Alert and oriented X 2. L side weakness, can wiggle toes  Labs    Hematology Recent Labs  Lab 01/23/20 0530 01/24/20 0550 01/25/20 0544  WBC 20.1* 24.3* 26.2*  RBC 5.12 4.70 4.18*  HGB 15.2 13.8  12.0*  HCT 44.5 40.8 36.0*  MCV 86.9 86.8 86.1  MCH 29.7 29.4 28.7  MCHC 34.2 33.8 33.3  RDW 13.6 13.6 13.7  PLT 127* 125* 128*    Chemistry Recent Labs  Lab 01/24/20 0550 01/24/20 1732 01/25/20 0544  NA 136 134* 135  K 2.9* 2.9* 3.2*  CL 100 98 97*  CO2 28 28 25   GLUCOSE 152* 231* 130*  BUN 15 15 13   CREATININE 0.66 0.78 0.65  CALCIUM 8.2* 8.1* 8.4*  ALBUMIN 2.6*  --   --   GFRNONAA >60 >60 >60  GFRAA >60 >60 >60  ANIONGAP 8 8 13      High Sensitivity Troponin:  No results for input(s): TROPONINIHS in the last 720 hours.    BNPNo results for input(s): BNP, PROBNP in the last 168 hours.   Magnesium  Date Value Ref Range Status  01/25/2020 1.7 1.7 - 2.4 mg/dL Final     Comment:    Performed at [facility], [address] [place], [facility] [address]     Radiology    MR ANGIO HEAD WO CONTRAST  Result Date: 01/22/2020 CLINICAL DATA:  70 year old male who presented with acute headache on 01/14/2020 found to have subarachnoid hemorrhage and severe intracranial Vasospasm which was treated with balloon angioplasty on 01/18/2020. No intracranial aneurysm has been identified. 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 Dense left hemiplegia now. EXAM: MRI HEAD WITHOUT CONTRAST MRA HEAD WITHOUT CONTRAST TECHNIQUE: Multiplanar, multiecho pulse sequences of the brain and surrounding structures were obtained without intravenous contrast. Angiographic images of the head were obtained using MRA technique without contrast. COMPARISON:  Brain MRI and intracranial MRA 01/19/2020 and earlier. FINDINGS: MRI HEAD FINDINGS Brain: Left side subdural hematoma measures up to 9 mm thickness now on coronal images, previously 6-7 mm). Unchanged signal within the left subdural blood products, mildly heterogeneous FLAIR signal within the left subdural hematoma. Trace right subdural blood mostly along the parietal and occipital lobes appears decreased. Rightward midline shift has increased to 7 mm (previously 6 mm). No ventriculomegaly. And no significant intraventricular hemorrhage despite continued basilar cistern predominant subarachnoid blood which is best demonstrated on FLAIR and DWI. Scattered small foci of restricted diffusion are redemonstrated in the right hemisphere were new and/or increased cortical restricted diffusion in the posterior right frontal and right parietal lobes near the perirolandic cortex on series 3, image 45 today. Compare to series 5, image 97 previously. Conspicuous restricted diffusion along the ventral right thalamus and right cauda thalamic groove has not significantly changed. No convincing abnormal  diffusion in the left hemisphere or posterior fossa, and no other areas of significantly progressed diffusion abnormality. Associated gyriform cytotoxic edema in the right perirolandic cortex. No malignant hemorrhagic transformation identified. Negative cervicomedullary junction and pituitary. Vascular: Major intracranial vascular flow voids appears stable. Skull and upper cervical spine: Negative visible cervical spine, bone marrow signal. Sinuses/Orbits: Negative orbits. Stable mild paranasal sinus mucosal thickening. Other: Stable trace mastoid fluid. MRA HEAD FINDINGS Intracranial MRA today is more motion degraded in addition to some vessel obscuration due to the T1 intrinsic subarachnoid blood. Antegrade flow appears stable in the distal vertebral arteries and basilar. No convincing vertebrobasilar stenosis. As before fetal type PCA origins are suspected, more so the left. Largely obscured PCA branch detail today. Antegrade flow in both ICA siphons appears stable. Both carotid termini appear patent. The right ACA A1 appears to be dominant as before. Both MCA M1 segments  are patent. But bilateral MCA and ACA branch detail is largely obscured today. IMPRESSION: 1. Mildly increased Left Subdural Hematoma since 01/19/2020, now up to 9 mm in thickness. Smaller right side subdural hematoma has regressed, now trace. 2. Associated mildly increased rightward midline shift, now 7 mm. 3. Stable moderate volume basilar cistern predominant Subarachnoid Hemorrhage with no significant intraventricular hemorrhage and no ventriculomegaly. 4. Increased right hemisphere superior peri-Rolandic infarcts since 01/19/2020. Cytotoxic edema with no associated parenchymal hemorrhage or mass effect. 5. Otherwise stable multifocal right hemisphere ischemia, including at the right caudothalamic groove. 6. Intracranial MRA is more motion degraded today, but flow within the large vessels appear stable since 01/19/2020. Largely obscured  second order and distal branches today. Electronically Signed   By: Odessa Fleming M.D.   On: 01/22/2020 15:22   MR BRAIN WO CONTRAST  Result Date: 01/22/2020 CLINICAL DATA:  69 year old male who presented with acute headache on 01/14/2020 found to have subarachnoid hemorrhage and severe intracranial Vasospasm which was treated with balloon angioplasty on 01/18/2020. No intracranial aneurysm has been identified. 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 Dense left hemiplegia now. EXAM: MRI HEAD WITHOUT CONTRAST MRA HEAD WITHOUT CONTRAST TECHNIQUE: Multiplanar, multiecho pulse sequences of the brain and surrounding structures were obtained without intravenous contrast. Angiographic images of the head were obtained using MRA technique without contrast. COMPARISON:  Brain MRI and intracranial MRA 01/19/2020 and earlier. FINDINGS: MRI HEAD FINDINGS Brain: Left side subdural hematoma measures up to 9 mm thickness now on coronal images, previously 6-7 mm). Unchanged signal within the left subdural blood products, mildly heterogeneous FLAIR signal within the left subdural hematoma. Trace right subdural blood mostly along the parietal and occipital lobes appears decreased. Rightward midline shift has increased to 7 mm (previously 6 mm). No ventriculomegaly. And no significant intraventricular hemorrhage despite continued basilar cistern predominant subarachnoid blood which is best demonstrated on FLAIR and DWI. Scattered small foci of restricted diffusion are redemonstrated in the right hemisphere were new and/or increased cortical restricted diffusion in the posterior right frontal and right parietal lobes near the perirolandic cortex on series 3, image 45 today. Compare to series 5, image 97 previously. Conspicuous restricted diffusion along the ventral right thalamus and right cauda thalamic groove has not significantly changed. No convincing abnormal diffusion in the  left hemisphere or posterior fossa, and no other areas of significantly progressed diffusion abnormality. Associated gyriform cytotoxic edema in the right perirolandic cortex. No malignant hemorrhagic transformation identified. Negative cervicomedullary junction and pituitary. Vascular: Major intracranial vascular flow voids appears stable. Skull and upper cervical spine: Negative visible cervical spine, bone marrow signal. Sinuses/Orbits: Negative orbits. Stable mild paranasal sinus mucosal thickening. Other: Stable trace mastoid fluid. MRA HEAD FINDINGS Intracranial MRA today is more motion degraded in addition to some vessel obscuration due to the T1 intrinsic subarachnoid blood. Antegrade flow appears stable in the distal vertebral arteries and basilar. No convincing vertebrobasilar stenosis. As before fetal type PCA origins are suspected, more so the left. Largely obscured PCA branch detail today. Antegrade flow in both ICA siphons appears stable. Both carotid termini appear patent. The right ACA A1 appears to be dominant as before. Both MCA M1 segments are patent. But bilateral MCA and ACA branch detail is largely obscured today. IMPRESSION: 1. Mildly increased Left Subdural Hematoma since 01/19/2020, now up to 9 mm in thickness. Smaller right side subdural hematoma has regressed, now trace. 2. Associated mildly increased rightward midline shift, now 7 mm.  3. Stable moderate volume basilar cistern predominant Subarachnoid Hemorrhage with no significant intraventricular hemorrhage and no ventriculomegaly. 4. Increased right hemisphere superior peri-Rolandic infarcts since 01/19/2020. Cytotoxic edema with no associated parenchymal hemorrhage or mass effect. 5. Otherwise stable multifocal right hemisphere ischemia, including at the right caudothalamic groove. 6. Intracranial MRA is more motion degraded today, but flow within the large vessels appear stable since 01/19/2020. Largely obscured second order and  distal branches today. Electronically Signed   By: Odessa Fleming M.D.   On: 01/22/2020 15:22   DG CHEST PORT 1 VIEW  Result Date: 01/22/2020 CLINICAL DATA:  Hypoxia/respiratory failure EXAM: PORTABLE CHEST 1 VIEW COMPARISON:  Jan 20, 2020 FINDINGS: There is mild left base atelectasis. The lungs elsewhere are clear. There is cardiomegaly with pulmonary vascularity normal. No adenopathy. There is degenerative change in the thoracic spine. IMPRESSION: Slight left base atelectasis. Lungs elsewhere clear. Stable cardiac prominence. No adenopathy evident. Electronically Signed   By: Bretta Bang III M.D.   On: 01/22/2020 07:59   VAS [place] TRANSCRANIAL DOPPLER  Result Date: 01/24/2020  Transcranial Doppler Indications: Subarachnoid hemorrhage. Performing Technologist: Jeb Levering RDMS, RVT  Examination Guidelines: A complete evaluation includes B-mode imaging, spectral Doppler, color Doppler, and power Doppler as needed of all accessible portions of each vessel. Bilateral testing is considered an integral part of a complete examination. Limited examinations for reoccurring indications may be performed as noted.  +----------+-------------+----------+-----------+--------------+ RIGHT TCD Right VM (cm)Depth (cm)Pulsatility   Comment     +----------+-------------+----------+-----------+--------------+ MCA          194.00                 1.15                   +----------+-------------+----------+-----------+--------------+ ACA          -80.00                 0.54                   +----------+-------------+----------+-----------+--------------+ Term ICA                                    not visualized +----------+-------------+----------+-----------+--------------+ PCA           55.00                 1.27                   +----------+-------------+----------+-----------+--------------+ Opthalmic     28.00                 1.62                    +----------+-------------+----------+-----------+--------------+ ICA siphon    66.00                 1.03                   +----------+-------------+----------+-----------+--------------+ Vertebral    -37.00                 1.05                   +----------+-------------+----------+-----------+--------------+  +----------+------------+----------+-----------+--------------+ LEFT TCD  Left VM (cm)Depth (cm)Pulsatility   Comment     +----------+------------+----------+-----------+--------------+ MCA          81.00  1.43                   +----------+------------+----------+-----------+--------------+ ACA          -87.00                1.21                   +----------+------------+----------+-----------+--------------+ PCA          34.00                 1.46                   +----------+------------+----------+-----------+--------------+ Opthalmic    26.00                  2.3                   +----------+------------+----------+-----------+--------------+ ICA siphon   80.00                 1.81                   +----------+------------+----------+-----------+--------------+ Vertebral                                  not visualized +----------+------------+----------+-----------+--------------+  +------------+-------+--------------+             VM cm/s   Comment     +------------+-------+--------------+ Prox Basilar       not visualized +------------+-------+--------------+ Summary:  Elevated right middle cerebral artery mean flow velocities suggest moderate and worsening vasospasm. Mildly elevated left middle cerebral, and left carotid siphon mean flow velocities of unclear significance. *See table(s) above for TCD measurements and observations.  Diagnosing physician: Antony Contras MD Electronically signed by Antony Contras MD on 01/24/2020 at 8:15:01 AM.    Final    VAS [place] TRANSCRANIAL DOPPLER  Result Date: 01/22/2020  Transcranial  Doppler Indications: Subarachnoid hemorrhage. Comparison Study: 01/19/20 previous Performing Technologist: Abram Sander RVS Supporting Technologist: Sharion Dove RVS  Examination Guidelines: A complete evaluation includes B-mode imaging, spectral Doppler, color Doppler, and power Doppler as needed of all accessible portions of each vessel. Bilateral testing is considered an integral part of a complete examination. Limited examinations for reoccurring indications may be performed as noted.  +----------+-------------+----------+-----------+-------+ RIGHT TCD Right VM (cm)Depth (cm)PulsatilityComment +----------+-------------+----------+-----------+-------+ MCA          127.00                 1.13            +----------+-------------+----------+-----------+-------+ ACA          -14.00                 1.22            +----------+-------------+----------+-----------+-------+ Term ICA     108.00                 1.54            +----------+-------------+----------+-----------+-------+ PCA           36.00                 1.35            +----------+-------------+----------+-----------+-------+ Opthalmic     47.00                 2.02            +----------+-------------+----------+-----------+-------+  ICA siphon    36.00                 1.13            +----------+-------------+----------+-----------+-------+ Vertebral    -42.00                 1.29            +----------+-------------+----------+-----------+-------+  +----------+------------+----------+-----------+-------+ LEFT TCD  Left VM (cm)Depth (cm)PulsatilityComment +----------+------------+----------+-----------+-------+ MCA          59.00                 1.37            +----------+------------+----------+-----------+-------+ Term ICA     43.00                 1.30            +----------+------------+----------+-----------+-------+ Opthalmic    34.00                 1.36             +----------+------------+----------+-----------+-------+ ICA siphon   103.00                1.10            +----------+------------+----------+-----------+-------+ Vertebral    -18.00                0.97            +----------+------------+----------+-----------+-------+  +------------+-------+-------+             VM cm/sComment +------------+-------+-------+ Prox Basilar-62.00         +------------+-------+-------+ Summary:  Mildly elevated right middlle crebral and bilateral carotid siphon mean flow velocities indicate mild vasospasm. Normal mean flow velocities in remaining identified vessels of anterior and posterior cerebral circulations.Globally elevated pulsatility indices indicate increase in intracranial pressure likely. *See table(s) above for TCD measurements and observations.  Diagnosing physician: Delia Heady MD Electronically signed by Delia Heady MD on 01/22/2020 at 8:57:11 AM.    Final      Cardiac Studies   ECHO:  01/15/2020 1. Normal LV systolic function; proximal septal thickening; grade 1  diastolic dysfunction; mildly dilated aortic root; scerotic aortic valve.  2. Left ventricular ejection fraction, by estimation, is 70 to 75%. The  left ventricle has hyperdynamic function. The left ventricle has no  regional wall motion abnormalities. There is mild left ventricular  hypertrophy of the basal segment. Left  ventricular diastolic parameters are consistent with Grade I diastolic  dysfunction (impaired relaxation).  3. Right ventricular systolic function is normal. The right ventricular  size is normal.  4. The mitral valve is normal in structure. No evidence of mitral valve  regurgitation. No evidence of mitral stenosis.  5. The aortic valve is tricuspid. Aortic valve regurgitation is not  visualized. Mild to moderate aortic valve sclerosis/calcification is  present, without any evidence of aortic stenosis.  6. Aortic dilatation noted. There is mild  dilatation of the aortic root  measuring 38 mm.  7. The inferior vena cava is normal in size with greater than 50%  respiratory variability, suggesting right atrial pressure of 3 mmHg.   Patient Profile     69 y.o. male w/ hx BPH, colon polyp, deviated septum, DM2, hepatitis A, HSV-1 infection, HLD, HTN, migraine headache was admitted 04/26 w/ SAH, clinical vasospasm of basilar, MCA and right ICA s/p angioplasty. Because of the vasospasm, permissive HTN w/ SBP goal 180-220.  Assessment & Plan    Continue IV amiodarone for arrhythmia suppression. Keep Mg>2 and K>4. If possible minimize levophed, in favor of vasopressin and neosynephrine to maintain permissive hypertension and decrease B agonist exposure.  Chrystie Nose, MD, [facility], FACP  [place]  [facility]  Medical Director of the [facility]  [facility] Diplomate of the American Board of Clinical Lipidology Attending Cardiologist  Direct Dial: [phone]  Fax: [phone]  Website:  [email or web address] 1:07 PM 01/25/2020

## 2020-01-25 NOTE — Progress Notes (Addendum)
eLink Physician-Brief Progress Note Patient Name: Nicholas [name] DOB: 04-15-1951 MRN: [id number]   Date of Service  01/25/2020  HPI/Events of Note  PVCs. K noted from this afternoon? Not replaced  eICU Interventions  BMP and mag now, plz call with results      Intervention Category Major Interventions: Arrhythmia - evaluation and management  Lorissa Kishbaugh G Divon Krabill 01/25/2020, 9:47 PM   11:55 pm K if 3.5 and mag is 1.8, patient also on potassium containing fluids at 100 cc/hour Will give an additional 40 meq oral potassium and 2 gram IV mag Repeat level ordered for 5 am  Inform us if K < 4 or mag < 2 , or if K >4. 5   5.15 am K remains 3.5, mag improved to 2.1 Additional 40 meq oral tab ordered Repeat ordered in 4 hours

## 2020-01-25 NOTE — Progress Notes (Signed)
STROKE TEAM PROGRESS NOTE   INTERVAL HISTORY Wife at bedside, stated that pt has some delirium and intermittently talk something does not make sense. Pt mildly lethargic but left UE strength improved, still has significant weakness at left LE. Still on 3 pressors, now on amiodarone, PVC much improved.   OBJECTIVE Vitals:   01/25/20 0645 01/25/20 0700 01/25/20 0715 01/25/20 0800  BP: (!) 210/81 (!) 192/70 (!) 190/88   Pulse: 95 81 83   Resp: (!) 21 (!) 23 (!) 25   Temp:    98.1 F (36.7 C)  TempSrc:    Oral  SpO2: 96% 95% 96%   Weight:      Height:       CBC:  Recent Labs  Lab 01/24/20 0550 01/25/20 0544  WBC 24.3* 26.2*  NEUTROABS 20.4* 23.5*  HGB 13.8 12.0*  HCT 40.8 36.0*  MCV 86.8 86.1  PLT 125* 128*   Basic Metabolic Panel:  Recent Labs  Lab 01/24/20 0550 01/24/20 0550 01/24/20 1732 01/25/20 0544  NA 136   < > 134* 135  K 2.9*   < > 2.9* 3.2*  CL 100   < > 98 97*  CO2 28   < > 28 25  GLUCOSE 152*   < > 231* 130*  BUN 15   < > 15 13  CREATININE 0.66   < > 0.78 0.65  CALCIUM 8.2*   < > 8.1* 8.4*  MG 1.7   < > 1.9 1.7  PHOS 2.5  --   --  2.7   < > = values in this interval not displayed.   Lipid Panel:     Component Value Date/Time   CHOL 88 01/19/2020 0456   TRIG 124 01/19/2020 0456   HDL 26 (L) 01/19/2020 0456   CHOLHDL 3.4 01/19/2020 0456   VLDL 25 01/19/2020 0456   LDLCALC 37 01/19/2020 0456   HgbA1c:  Lab Results  Component Value Date   HGBA1C 7.7 (H) 01/14/2020   Urine Drug Screen:     Component Value Date/Time   LABOPIA NONE DETECTED 01/19/2020 1058   COCAINSCRNUR NONE DETECTED 01/19/2020 1058   LABBENZ NONE DETECTED 01/19/2020 1058   AMPHETMU NONE DETECTED 01/19/2020 1058   THCU POSITIVE (A) 01/19/2020 1058   LABBARB NONE DETECTED 01/19/2020 1058     IMAGING past 24h No results found.  PHYSICAL EXAM    Temp:  [98.1 F (36.7 C)-100 F (37.8 C)] 98.1 F (36.7 C) (05/07 0800) Pulse Rate:  [73-108] 83 (05/07 0715) Resp:   [15-28] 25 (05/07 0715) BP: (89-225)/(50-107) 190/88 (05/07 0715) SpO2:  [90 %-98 %] 96 % (05/07 0715)  General - Well nourished, well developed, not in acute distress, mildly lethargic.  Ophthalmologic - fundi not visualized due to noncooperation.  Cardiovascular - Regular rate and rhythm.  Neuro - awake alert, eye open. Able to answer questions appropriately, orientated to place, person and age, but not to time, no dysarthria. B/l CN VI imcomplete gaze. Mild right ptosis, but PERRL. Visual field full. Left facial droop, tongue protrusion to the left. Left UE 4-/5 proximal and 3/5 distal, LLE 1/5 proximal on pain stimulation and 0/5 distal toe wiggling. RUE and RLE at least 4/5 and following commands. B/l babinski positive. Sensation subjectively symmetrical, coordination intact right FTN and gait not tested.   ASSESSMENT/PLAN Nicholas [name] is a 69 y.o. male with history of diabetes, migraines, herpes simplex virus 1, hypertension, hyperlipidemia, hepatitis A, and benign prostatic hypertrophy transported from  [place] after developing sudden onset of severe headache initially with mild left upper extremity drift but still able to carry on a conversation. Later he developed fixed right gaze deviation as well as increased left arm and left leg weakness but was still able to communicate and speak.  He did not receive IV t-PA due to [place].  SAH with vasospasm - extensive angio-negative SAH with b/l ICA, MCA, BA vasospasm s/p angioplasty  Resultant left hemiplegia  CT head 4/26 - Large volume subarachnoid hemorrhage, right greater than left  CTA H/N 4/26 - 25% diameter stenosis proximal right internal carotid artery. 50% diameter stenosis proximal left internal carotid artery. Mild to moderate stenosis in the cavernous carotid bilaterally due to atherosclerotic calcification  CTA head 4/30 - Overall decreased volume of subarachnoid hemorrhage since 01/15/2020. Diminished flow  within the right ICA with no enhancement visualized at and beyond the cavernous segment. Mild increased right M1 MCA narrowing and diminished distal right MCA branch filling. New narrowing of the left A1 ACA with severe stenosis near the origin. Persistent irregularity of the distal basilar artery with new superimposed severe stenosis.  IR - significant improvement in vessel caliber and distal territory perfusion after balloon angioplasty of the right internal carotid artery, right middle cerebral artery, and basilar artery  MRI head 5/1 - Multiple small acute infarcts the right cerebral hemisphere primarily involving the anterior greater than posterior circulations. Thin left larger than right subdural collections with stable mild rightward midline shift.   MRA head 5/1 - Improved flow within the intracranial right ICA compared to 01/18/2020 CTA. Appearance is now similar to 01/14/2020 CTA with persistent severe stenosis of the supraclinoid portion. Likely persistent left A1 ACA stenosis and distal basilar stenosis.  MRI head repeat 5/4 mildly increased L SDH up to 54mm thick, R SDH now trace only. Mild increase in midline shift to 62mm. Stable basilar cistern SAH. Increased R superior per-rolandic infarcts w/ edema.   MRA head repeat 5/4 large vessel flow stable.  2D Echo - EF 70 - 75%. Grade I diastolic dysfunction (impaired relaxation).   Sars Corona Virus 2 - negative  LDL - 37  HgbA1c - 7.7  UDS - positive for THC  VTE prophylaxis - heparin subq changed to arixtra  aspirin 81 mg daily prior to admission, now on No antithrombotic  Therapy recommendations:  CIR  Disposition:  Pending  Palliative care following for supportive care, counseling and sx management  Stroke - due to vasospasm  CTA head 4/30 - Overall decreased volume of subarachnoid hemorrhage since 01/15/2020. Diminished flow within the right ICA with no enhancement visualized at and beyond the cavernous segment. Mild  increased right M1 MCA narrowing and diminished distal right MCA branch filling. New narrowing of the left A1 ACA with severe stenosis near the origin. Persistent irregularity of the distal basilar artery with new superimposed severe stenosis.  IR - significant improvement in vessel caliber and distal territory perfusion after balloon angioplasty of the right internal carotid artery, right middle cerebral artery, and basilar artery  MRI head 5/1 - Multiple small acute infarcts the right cerebral hemisphere primarily involving the anterior greater than posterior circulations. Thin left larger than right subdural collections with stable mild rightward midline shift.   MRA head 5/1 - Improved flow within the intracranial right ICA compared to 01/18/2020 CTA. Appearance is now similar to 01/14/2020 CTA with persistent severe stenosis of the supraclinoid portion. Likely persistent left A1 ACA stenosis and distal basilar  stenosis.  MRI head repeat 5/4 mildly increased L SDH up to 14mm thick, R SDH now trace only. Mild increase in midline shift to 85mm. Stable basilar cistern SAH. Increased R superior per-rolandic infarcts w/ edema.   MRA head repeat 5/4 large vessel flow stable.  TCD 4/28 - Elevated right middle cerebral and carotid siphon and left middle cerebral artery and carotid siphon mean flow velocities suggest mild vasospasm. Slight improvement compared with prior TCD study 01/14/20   TCD 5/1- improvement but still mild vasospasm  TCD 5/3 - mild right MCA and b/l ICA siphon vasospasm   TCD 5/5 - persistent right MCA vasospasm, but other's MFV normalized   TCD 5/7 - improved right MCA MFV, indicating improved vasospasm  BP goal 180-220  On levophed, Neo, vasopressin, IVF  completed albumin 4 doses  Change nimodipine 60mg  Q4h to 30mg  Q2h  Hypertension Triple H therepy  Home BP meds: Lisinopril  Current BP meds: Nimodipine   SBP goal - 180 - 220   On Levophed, Neo and  vasopressin  On midodrine 10 tid  On IVF @125  -> 100  Completed albumin  Frequent PVCs and short Vtach run, improved  Asymptomatic  Concerning cardiac burden with pressors and IVF  Stopped metoprolol, now on amiodarone  Cardiology on board  Hyperlipidemia  Home Lipid lowering medication: Lipitor 80 mg daily and Fenofibrate (Crestor 40 mg daily) Lovaza  LDL - 37, goal < 70  Current lipid lowering medication: Crestor 40 mg daily  Fenofibrate on hold d/t risk QT prolongation   Continue statin and fenofibrate at discharge  Diabetes  Home diabetic meds: insulin, glucotrol, Actos, Janumet, Xigduo  Current diabetic meds: levemir 40 am & 35 pm, novolog 12 u tid  HgbA1c 7.7, goal < 7.0  SSI 0-24 q 4h  CBG monitoring  Hyperglycemia much improved  DM coordinator following   Fever   Tmax 100.7 -> afebrile->100.0-> afebrile  Leukocytosis WBCs - 14.8->18.5->20.5->20.2->20.1->24.3->26.2    Urine culture 4/26 neg   CXR 5/1 - Cardiomegaly without acute abnormality of the lungs in AP portable projection.  UA - neg  blood culture NGTD   Other Stroke Risk Factors  Advanced age  ETOH use, advised to drink no more than 1 alcoholic beverage per day.  Migraines  Other Active Problems  Code status - Full code  Keppra seizure prophylaxis  Thrombocytosis PLT 164->127->125->128. 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 Likely RBBB etiology. Added low dose metoprolol. Avoid watch QTc and avoid QTc prolonging agents - cardiology on board  Anemia Hb 13.8->12.0  Hospital day # 11  Neurology will follow up in peripheral. Please call with questions. Thanks for the consult.   , MD PhD Stroke Neurology 01/25/2020 8:42 AM  To contact Stroke Continuity provider, please refer to 5/26. After hours, contact General Neurology

## 2020-01-25 NOTE — Progress Notes (Signed)
PULMONARY / CRITICAL CARE MEDICINE   NAME:  Nicholas [name], MRN:  [id number], DOB:  [birth year], LOS: 11 ADMISSION DATE:  01/14/2020, CONSULTATION DATE:  01/14/20 REFERRING MD: ED Physician, CHIEF COMPLAINT:  SAH  BRIEF HISTORY:    69 yo male who presented for 24 hours of progressive headache and nuchal rigidity with associated nausea, vomiting and confusion. Patient was transported by EMS to [facility] for imaging and transferred to [facility] for further evaluation.  Both CTA and catheter angiography negative for aneurysm due to presence of vasospasm. Severe symptomatic 3 vessel vasospasm and has responded reasonably well to chemical/mechanical angioplasty and hyperdynamic treatment.   SIGNIFICANT PAST MEDICAL HISTORY   He  has a past medical history of BPH (benign prostatic hypertrophy) (12/20/2011), Colon polyp, Deviated septum, Diabetes mellitus without complication (HCC), Hepatitis A, HSV-1 (herpes simplex virus 1) infection, Hyperlipidemia, Hypertension, and Migraine headache.  SIGNIFICANT EVENTS:  4/30 > Diagnostic cerebral angiogram with balloon angioplasty 4/30 5/3 > Pt is lying in bed in no acute distress, he is alert and oriented x3. Denies any acute complaints. Slowed responses but appropriate.  5/4 > No acute events overnight. Pt is lying comfortably in bed in no acute distress. Oriented to self, place, time with delayed responses. Remains on two vasopressors for escalated map goal per neuro/neurosx 5/7 > Remains on triple H therapy, TCD 5/6 revealed moderate and worsening vasospasm but clinical exam remains stable.   STUDIES:   Echo 4/27 1. Normal LV systolic function; proximal septal thickening; grade 1  diastolic dysfunction; mildly dilated aortic root; scerotic aortic valve.  2. Left ventricular ejection fraction, by estimation, is 70 to 75%. The  left ventricle has hyperdynamic function. The left ventricle has no  regional wall motion abnormalities. There is mild left  ventricular  hypertrophy of the basal segment. Left  ventricular diastolic parameters are consistent with Grade I diastolic  dysfunction (impaired relaxation).  3. Right ventricular systolic function is normal. The right ventricular  size is normal.  4. The mitral valve is normal in structure. No evidence of mitral valve  regurgitation. No evidence of mitral stenosis.  5. The aortic valve is tricuspid. Aortic valve regurgitation is not  visualized. Mild to moderate aortic valve sclerosis/calcification is  present, without any evidence of aortic stenosis.  6. Aortic dilatation noted. There is mild dilatation of the aortic root  measuring 38 mm.  7. The inferior vena cava is normal in size with greater than 50%  respiratory variability, suggesting right atrial pressure of 3 mmHg.   Transcranial Doppler 4/28 Elevated right middle cerebral and carotid siphon and left middle cerebral artery and carotid siphon mean flow velocities suggest mild vasospasm.  Slight improvement compared with prior TCD study 01/14/20   CT ANGIO HEAD W OR WO CONTRAST 01/18/2020  Overall decreased volume of subarachnoid hemorrhage since 01/15/2020.   New left subdural hygroma and subcentimeter rightward midline shift.   Resolution of hydrocephalus.   Further significantly diminished flow within the right ICA with no enhancement visualized at and beyond the cavernous segment.   Mild increased right M1 MCA narrowing and diminished distal right MCA branch filling.   New narrowing of the left A1 ACA with severe stenosis near the origin.   Persistent irregularity of the distal basilar artery with new superimposed severe stenosis.   IR PTA Vasospasm Initial IR PTA Vasospasm Add Diff IR ENDOVASC INTRACRANIAL INF OTHER THAN THROMBO ART INC DIAG ANGIO IR ENDOVASC INTRACRANIAL INF OTHER THAN THROMBO ART INC DIAG  ANGIO EA ADD 01/18/2020  Severe vasospasm involving the supraclinoid segments of the internal  carotid arteries bilaterally and the length of the basilar artery.   There is significant improvement in vessel caliber and distal territory perfusion after balloon angioplasty of the right internal carotid artery, right middle cerebral artery, and basilar artery.   TCD 5/1 Improvement in vasospasm overall but persistent elevated right middle  cerebral and left carotid siphon mean flow velocities suggestive of mild  vasospasm.   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  Thin left larger than right subdural collections with stable mild rightward midline shift.  Residual subarachnoid hemorrhage likely similar to prior CT.  Suboptimal vascular evaluation due to intrinsic T1 hyperintensity of hemorrhage. Improved flow within the intracranial right ICA compared to 01/18/2020 CTA.   Appearance is now similar to 01/14/2020 CTA with persistent severe stenosis of the supraclinoid portion. Likely persistent left A1 ACA stenosis and distal basilar stenosis.  TCD 5/3 Mildly elevated right middlle crebral and bilateral carotid siphon mean  flow velocities indicate mild vasospasm. Normal mean flow velocities in  remaining identified vessels of anterior and posterior cerebral circulations.Globally elevated pulsatility indices indicate increase in intracranial pressure likely.   TCD 5/4  Mildly elevated right middlle crebral and bilateral carotid siphon mean  flow velocities indicate mild vasospasm. Normal mean flow velocities in  remaining identified vessels of anterior and posterior cerebral circulations.Globally elevated pulsatility indices indicate increase in intracranial pressure likely.   5/4 MRA head/ MRI brain > 1. Mildly increased Left Subdural Hematoma since 01/19/2020, now up to 9 mm in thickness. Smaller right side subdural hematoma has regressed, now trace. 2. Associated mildly  increased rightward midline shift, now 7 mm. 3. Stable moderate volume basilar cistern predominant Subarachnoid Hemorrhage with no significant intraventricular hemorrhage and no ventriculomegaly. 4. Increased right hemisphere superior peri-Rolandic infarcts since 01/19/2020. Cytotoxic edema with no associated parenchymal hemorrhage or mass effect. 5. Otherwise stable multifocal right hemisphere ischemia, including at the right caudothalamic groove. 6. Intracranial MRA is more motion degraded today, but flow within the large vessels appear stable since 01/19/2020. Largely obscured second order and distal branches today.  TCD 5/5  Elevated right middle cerebral artery mean flow velocities suggest  moderate and worsening vasospasm. Mildly elevated left middle cerebral,  and left carotid siphon mean flow velocities of unclear significance.   CULTURES:  COVID 4/26 > negative MRSA PCR 4/26 > negative  BCx2 4/26 >> neg Urine culture 4/26: neg Blood culture 5/2: NGTD  ANTIBIOTICS:  4/26 vanc x 1 4/26 cefepime x1  LINES/TUBES:  CVC triple lumen catheter (left subclavian) 01/18/20 A-line 4/30 >> 5/1 Foley urinary catheter  CONSULTANTS:  Neurology, neurosurgery  SUBJECTIVE:  Seen sitting up in bed with complaint of neck and back pain. RN reports no acute events overnight.   CONSTITUTIONAL: BP (!) 190/88   Pulse 83   Temp 98.7 F (37.1 C) (Oral)   Resp (!) 25   Ht 6' (1.829 m)   Wt 96.5 kg   SpO2 96%   BMI 28.85 kg/m   I/O last 3 completed shifts: In: 9210.6 [I.V.:8568.2; IV Piggyback:642.5] Out: 9900 [Urine:9900]  CVP:  [4 mmHg-18 mmHg] 12 mmHg    PHYSICAL EXAM: General: Pleasant middle aged male lying in bed in no acute distress.  HEENT: [place]/AT, MM pink/moist, PERRL, right eye ptosis  Neuro: Alert and oriented x3, 8/8 strength in RUE and RLE, 35/  LUE, 2/5 LLE, able to follow all commands CV: s1s2 regular rate and rhythm, no murmur, rubs, or gallops,  PULM:  Clear to  ascultation bilaterally  GI: soft, bowel sounds active in all 4 quadrants, non-tender, non-distended Extremities: warm/dry, no edema  Skin: no rashes or lesions  RESOLVED PROBLEM LIST  None  ASSESSMENT AND PLAN   Mr. Dresch is a 69 yo with a history of hypertension, migraines, HSV-1, diabetes, HLD, hepatitis A, and BPH who was transported from [facility] after suddenly developing a severe headache and nuchal rigidity with associated left hemiplegia. CTA revealed large SAH in the basilar cisterns extending into bilateral sylvian fissures. SAH are most frequently due to rupture of aneurysm; however, no aneurysm was visualized on CTA nor catheter angiogram. He experienced clinical vasospasm of basilar, MCA, and right ICA s/p angioplasty. He is currently on [facility] treatment.  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  -S/P diagnostic cerebral angiogram with balloon angioplasty  - MRA/MRI 5/4 showed no significant worsening of b/l ICA vasospasm with plans by NSGY to continue [facility] therapy P: Management per neurology  Maintain neuro protective measures; goal for eurothermia, euglycemia, eunatermia, normoxia,  Nutrition and bowel regiment  Seizure precautions  AEDs per neurology  Continue triple H therapy per neurology  Remains on Nimodipine, Levo, Neo, Vaso, and midodrine for induced hypertension  Continue IV hydration, and KCL to fluids Trend CVP Frequent neuro checks  Trend Bmets  Low grade fevers -Most likely due to atelectasis vs neurogenic fever associated with SAH. Neurogenic fever can result from a disruption in the hypothalamic set point temperature -> abnormal increase in body temperature associated with injury to hypothalamus. P: WBC continued to slowly elevate, currently 26.2, trend  Follow fever curve, Tmax over last 12 hrs 100.0 Continue to encourage aggressive pulmonary hygiene   Prolonged QT interval Ventriclar ectopy   -EKG 5/6 with imporved QTc 436 -Holding fenofibrate per pharmacy recs given it can affect QTc, (triglycerides 124 on 5/1) P: Cardiology following  Continue amiodarone drip  Supplement Mg again today for goal > 2 Supplement PO potassium today for goal > 4 Repeat EKG in am    Insulin dependent type 2 diabetes  -Home medications include Tresibas and Levemir  -Hemoglobin A1C 01/14/2020 P: Continue SSI and meal coverage  Increase AM Levemir to 40units CBG checks ACHS CBG goal < 180   Thrombocytopenia -Concern for HIT, heparin on 4/30 and restarted 5/3, 4H score 4 P: HIT antibody 0.292 Follow PLT count  Monitor for bleeding  Discussed with attending, will continue Fondaparinux for now   Hyponatremia  -Potassium 3.2 5/7 P: Supplement PO potassium today  Add potassium to IV fluids  Repeat Bmet later today    SUMMARY OF TODAY'S PLAN:  Continue triple H therapy per neurology Supplement electrolytes  Mobilize as able  Best Practice / Goals of Care / Disposition.   Diet: heart healthy/ carb modified Pain/Anxiety/Delirium protocol (if indicated): PRNs VAP protocol (if indicated): Pulmonary hygiene  DVT prophylaxis: SCD, fondaparinux GI prophylaxis:PPI Glucose control: see above Mobility: Bedrest Code Status: Full FAMILY DISCUSSIONS: Per primary DISPOSITION: continue Neuro ICU care  LABS  Glucose Recent Labs  Lab 01/24/20 1233 01/24/20 1559 01/24/20 1857 01/24/20 2314 01/25/20 0312 01/25/20 0730  GLUCAP 231* 244* 206* 209* 158* 157*    BMET Recent Labs  Lab 01/24/20 0550 01/24/20 1732 01/25/20 0544  NA 136 134* 135  K 2.9* 2.9* 3.2*  CL 100 98 97*  CO2 28 28 25   BUN 15 15 13   CREATININE 0.66 0.78 0.65  GLUCOSE 152* 231* 130*    Liver Enzymes Recent Labs  Lab 01/24/20 0550  ALBUMIN 2.6*    Electrolytes Recent Labs  Lab 01/22/20 0532 01/22/20 0902 01/24/20 0550 01/24/20 1732 01/25/20 0544  CALCIUM 8.4*   < > 8.2* 8.1* 8.4*  MG  --    <  > 1.7 1.9 1.7  PHOS 2.7  --  2.5  --  2.7   < > = values in this interval not displayed.    CBC Recent Labs  Lab 01/23/20 0530 01/24/20 0550 01/25/20 0544  WBC 20.1* 24.3* 26.2*  HGB 15.2 13.8 12.0*  HCT 44.5 40.8 36.0*  PLT 127* 125* 128*    ABG No results for input(s): PHART, PCO2ART, PO2ART in the last 168 hours.  Coag's No results for input(s): APTT, INR in the last 168 hours.  Sepsis Markers Recent Labs  Lab 01/21/20 1108 01/22/20 0532  PROCALCITON 0.27 0.27    Cardiac Enzymes No results for input(s): TROPONINI, PROBNP in the last 168 hours.   CRITICAL CARE Performed by: 03/22/20  Total critical care time: 37 minutes  Critical care time was exclusive of separately billable procedures and treating other patients.  Critical care was necessary to treat or prevent imminent or life-threatening deterioration.  Critical care was time spent personally by me on the following activities: development of treatment plan with patient and/or surrogate as well as nursing, discussions with consultants, evaluation of patient's response to treatment, examination of patient, obtaining history from patient or surrogate, ordering and performing treatments and interventions, ordering and review of laboratory studies, ordering and review of radiographic studies, pulse oximetry and re-evaluation of patient's condition.  03/23/20, NP-C [facility] Contact / Pager information can be found on Amion  01/25/2020, 7:34 AM

## 2020-01-25 NOTE — Progress Notes (Signed)
TCD has been completed. Preliminary results can be found in CV Proc through chart review.   01/25/20 1:16 PM Olen Cordial RVT

## 2020-01-25 NOTE — Progress Notes (Signed)
Occupational Therapy Treatment Patient Details Name: Nicholas [name] MRN: [id number] DOB: [birth year] Today's Date: 01/25/2020    History of present illness 69 y.o. male admitted on 01/14/20 for HA and confusion found to have large volume SAH with bil ICA, MCA, BA vasospasm s/p angioplasty.  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 Pt also having runs of V-tach, so cardiology consulted.  Pt with significant PMH of DM, HTN, herpes simplex virus-1, hyperlipidemia, Hepatitis A.     OT comments  Pt progressing towards established OT goals. Continues to present with decreased balance, strength, attention to left, and cognition. Pt requiring increased cues for problem solving and sequencing. Performing sit<>stand with Mod-Max A +2 and use of sara stedy. Pt brushing his hair with set up and Min cues. Pt participating in LUE AROM and requiring Max verbal and visual cues to attend to task. Continue to recommend dc to CIR and will continue to follow acutely as admitted.    Follow Up Recommendations  CIR;Supervision/Assistance - 24 hour    Equipment Recommendations  None recommended by OT    Recommendations for Other Services Rehab consult    Precautions / Restrictions Precautions Precautions: Fall Precaution Comments: Lt hemiparesis with Lt neglect  Restrictions Weight Bearing Restrictions: No       Mobility Bed Mobility Overal bed mobility: Needs Assistance Bed Mobility: Supine to Sit;Sit to Supine     Supine to sit: Mod assist;HOB elevated;+2 for physical assistance     General bed mobility comments: Mod A +2 to bring LLE to EOB and then elevate trunk.   Transfers Overall transfer level: Needs assistance Equipment used: Ambulation equipment used Transfers: Sit to/from Stand Sit to Stand: Max assist;+2 physical assistance;Mod assist         General transfer comment: Mod A +2 to power up into standing from elevate  EOB. Max A at times for left lateral lean in standing. Max a +2 to power up from lower surface at recliner    Balance Overall balance assessment: Needs assistance Sitting-balance support: Feet supported;Bilateral upper extremity supported Sitting balance-Leahy Scale: Fair Sitting balance - Comments: Min Guard A for safety   Standing balance support: Bilateral upper extremity supported;During functional activity Standing balance-Leahy Scale: Poor Standing balance comment: Reliant on physical A and requiring cues for correcting left lateral lean                           ADL either performed or assessed with clinical judgement   ADL Overall ADL's : Needs assistance/impaired Eating/Feeding: Set up;Sitting Eating/Feeding Details (indicate cue type and reason): Having pt locate and reach for cup in left visual field.  Grooming: Brushing hair;Set up;Supervision/safety;Sitting                   Toilet Transfer: Moderate assistance;Maximal assistance;+2 for physical assistance;+2 for safety/equipment(sara stedy to recliner) Toilet Transfer Details (indicate cue type and reason): Mod A +2 from elevate bed. Max A +2 for elevating from recliner. Use of sara stedy for blocking bil knees and provide bar to pull with BUEs.         Functional mobility during ADLs: Moderate assistance;Maximal assistance;+2 for safety/equipment;+2 for physical assistance(sara stedy) General ADL Comments: Decreased strength, balance, attention to left, and cognition,     Vision   Additional Comments: Poor attention to left. abel to bring head turn and gaze to left with Max cues.  Perception     Praxis      Cognition Arousal/Alertness: Awake/alert Behavior During Therapy: Flat affect Overall Cognitive Status: Impaired/Different from baseline Area of Impairment: Attention;Following commands;Memory;Safety/judgement;Awareness                   Current Attention Level:  Focused;Sustained Memory: Decreased short-term memory Following Commands: Follows one step commands inconsistently;Follows one step commands with increased time Safety/Judgement: Decreased awareness of safety;Decreased awareness of deficits Awareness: Intellectual Problem Solving: Slow processing;Decreased initiation;Difficulty sequencing;Requires verbal cues General Comments: Decreased attention and requiring cues to sustain attention to task >90 seconds. Pt requiring increased cues for awareness and following commands.         Exercises Exercises: General Upper Extremity;Hand exercises General Exercises - Upper Extremity Shoulder Flexion: AROM;Left;10 reps;Seated Elbow Flexion: AROM;Left;10 reps;Seated Elbow Extension: AROM;Left;10 reps;Seated Hand Exercises Forearm Supination: AROM;Left;10 reps;Seated Forearm Pronation: AROM;Left;10 reps;Seated Digit Composite Flexion: AROM;Left;10 reps;Seated Composite Extension: AROM;Left;10 reps;Seated   Shoulder Instructions       General Comments VSS on RA . Son Development worker, community) and significant other Corrie Dandy) present throughout    Pertinent Vitals/ Pain       Pain Assessment: Faces Faces Pain Scale: Hurts little more Pain Location: "It hurts everywhere" Pain Descriptors / Indicators: Discomfort;Grimacing;Guarding Pain Intervention(s): Monitored during session;Limited activity within patient's tolerance;Repositioned  Home Living                                          Prior Functioning/Environment              Frequency  Min 2X/week        Progress Toward Goals  OT Goals(current goals can now be found in the care plan section)  Progress towards OT goals: Progressing toward goals  Acute Rehab OT Goals Patient Stated Goal: to get up on his feet OT Goal Formulation: With patient/family Time For Goal Achievement: 02/05/20 Potential to Achieve Goals: Good ADL Goals Pt Will Perform Grooming: with min  assist;sitting Pt Will Perform Lower Body Bathing: with mod assist;sit to/from stand Pt Will Perform Lower Body Dressing: with modified independence;sit to/from stand Pt Will Transfer to Toilet: with mod assist;stand pivot transfer;bedside commode Pt Will Perform Toileting - Clothing Manipulation and hygiene: with max assist;sit to/from stand  Plan Discharge plan remains appropriate    Co-evaluation    PT/OT/SLP Co-Evaluation/Treatment: Yes Reason for Co-Treatment: For patient/therapist safety;To address functional/ADL transfers;Complexity of the patient's impairments (multi-system involvement)   OT goals addressed during session: ADL's and self-care;Strengthening/ROM      AM-PAC OT "6 Clicks" Daily Activity     Outcome Measure   Help from another person eating meals?: A Little Help from another person taking care of personal grooming?: A Lot Help from another person toileting, which includes using toliet, bedpan, or urinal?: A Lot Help from another person bathing (including washing, rinsing, drying)?: A Lot Help from another person to put on and taking off regular upper body clothing?: Total Help from another person to put on and taking off regular lower body clothing?: Total 6 Click Score: 11    End of Session Equipment Utilized During Treatment: Gait belt  OT Visit Diagnosis: Hemiplegia and hemiparesis;Cognitive communication deficit (R41.841) Symptoms and signs involving cognitive functions: Cerebral infarction Hemiplegia - Right/Left: Left Hemiplegia - dominant/non-dominant: Non-Dominant Hemiplegia - caused by: Cerebral infarction   Activity Tolerance Patient tolerated treatment well   Patient Left in  bed;with call bell/phone within reach;with family/visitor present;with nursing/sitter in room   Nurse Communication Mobility status        Time: 5885-0277 OT Time Calculation (min): 58 min  Charges: OT General Charges $OT Visit: 1 Visit OT Treatments $Self  Care/Home Management : 8-22 mins $Therapeutic Activity: 8-22 mins  Gibson Lad MSOT, OTR/L Acute Rehab Pager: [phone] Office: [phone]   Theodoro Grist Malay Fantroy 01/25/2020, 4:30 PM

## 2020-01-26 LAB — BASIC METABOLIC PANEL
Anion gap: 12 (ref 5–15)
Anion gap: 9 (ref 5–15)
BUN: 10 mg/dL (ref 8–23)
BUN: 8 mg/dL (ref 8–23)
CO2: 27 mmol/L (ref 22–32)
CO2: 28 mmol/L (ref 22–32)
Calcium: 8 mg/dL — ABNORMAL LOW (ref 8.9–10.3)
Calcium: 8 mg/dL — ABNORMAL LOW (ref 8.9–10.3)
Chloride: 94 mmol/L — ABNORMAL LOW (ref 98–111)
Chloride: 95 mmol/L — ABNORMAL LOW (ref 98–111)
Creatinine, Ser: 0.67 mg/dL (ref 0.61–1.24)
Creatinine, Ser: 0.57 mg/dL — ABNORMAL LOW (ref 0.61–1.24)
GFR calc Af Amer: >60 mL/min (ref >60)
GFR calc Af Amer: >60 mL/min (ref >60)
GFR calc non Af Amer: >60 mL/min (ref >60)
GFR calc non Af Amer: >60 mL/min (ref >60)
Glucose, Bld: 185 mg/dL — ABNORMAL HIGH (ref 70–99)
Glucose, Bld: 125 mg/dL — ABNORMAL HIGH (ref 70–99)
Potassium: 3.5 mmol/L (ref 3.5–5.1)
Potassium: 3.5 mmol/L (ref 3.5–5.1)
Sodium: 133 mmol/L — ABNORMAL LOW (ref 135–145)
Sodium: 132 mmol/L — ABNORMAL LOW (ref 135–145)

## 2020-01-26 LAB — CBC WITH DIFFERENTIAL/PLATELET
Abs Immature Granulocytes: 0.14 10*3/uL — ABNORMAL HIGH (ref 0.00–0.07)
Basophils Absolute: 0 10*3/uL (ref 0.0–0.1)
Basophils Relative: 0 %
Eosinophils Absolute: 0 10*3/uL (ref 0.0–0.5)
Eosinophils Relative: 0 %
HCT: 35.3 % — ABNORMAL LOW (ref 39.0–52.0)
Hemoglobin: 11.9 g/dL — ABNORMAL LOW (ref 13.0–17.0)
Immature Granulocytes: 1 %
Lymphocytes Relative: 5 %
Lymphs Abs: 1.1 10*3/uL (ref 0.7–4.0)
MCH: 29.2 pg (ref 26.0–34.0)
MCHC: 33.7 g/dL (ref 30.0–36.0)
MCV: 86.5 fL (ref 80.0–100.0)
Monocytes Absolute: 1.2 10*3/uL — ABNORMAL HIGH (ref 0.1–1.0)
Monocytes Relative: 5 %
Neutro Abs: 20.6 10*3/uL — ABNORMAL HIGH (ref 1.7–7.7)
Neutrophils Relative %: 89 %
Platelets: 169 10*3/uL (ref 150–400)
RBC: 4.08 MIL/uL — ABNORMAL LOW (ref 4.22–5.81)
RDW: 14 % (ref 11.5–15.5)
WBC: 23.1 10*3/uL — ABNORMAL HIGH (ref 4.0–10.5)
nRBC: 0 % (ref 0.0–0.2)

## 2020-01-26 LAB — GLUCOSE, CAPILLARY
Glucose-Capillary: 122 mg/dL — ABNORMAL HIGH (ref 70–99)
Glucose-Capillary: 124 mg/dL — ABNORMAL HIGH (ref 70–99)
Glucose-Capillary: 169 mg/dL — ABNORMAL HIGH (ref 70–99)
Glucose-Capillary: 194 mg/dL — ABNORMAL HIGH (ref 70–99)
Glucose-Capillary: 198 mg/dL — ABNORMAL HIGH (ref 70–99)
Glucose-Capillary: 92 mg/dL (ref 70–99)

## 2020-01-26 LAB — CALCIUM, IONIZED: Calcium, Ionized, Serum: 4.7 mg/dL (ref 4.5–5.6)

## 2020-01-26 LAB — MAGNESIUM: Magnesium: 2.1 mg/dL (ref 1.7–2.4)

## 2020-01-26 MED ORDER — POTASSIUM CHLORIDE 10 MEQ/50ML IV SOLN
10.0000 meq | INTRAVENOUS | Status: AC
  Administered 2020-01-26 (×2): 10 meq via INTRAVENOUS
  Filled 2020-01-26 (×2): qty 50

## 2020-01-26 MED ORDER — MAGNESIUM SULFATE IN D5W 1-5 GM/100ML-% IV SOLN
1.0000 g | Freq: Once | INTRAVENOUS | Status: AC
  Administered 2020-01-26: 1 g via INTRAVENOUS
  Filled 2020-01-26: qty 100

## 2020-01-26 MED ORDER — POTASSIUM CHLORIDE 10 MEQ/50ML IV SOLN
10.0000 meq | INTRAVENOUS | Status: AC
  Administered 2020-01-26 (×2): 10 meq via INTRAVENOUS
  Filled 2020-01-26 (×2): qty 50

## 2020-01-26 MED ORDER — POTASSIUM CHLORIDE CRYS ER 20 MEQ PO TBCR
40.0000 meq | EXTENDED_RELEASE_TABLET | Freq: Once | ORAL | Status: AC
  Administered 2020-01-26: 40 meq via ORAL
  Filled 2020-01-26: qty 2

## 2020-01-26 MED ORDER — POTASSIUM CHLORIDE CRYS ER 20 MEQ PO TBCR
40.0000 meq | EXTENDED_RELEASE_TABLET | Freq: Once | ORAL | Status: AC
  Administered 2020-01-26: 06:00:00 40 meq via ORAL
  Filled 2020-01-26: qty 2

## 2020-01-26 NOTE — Progress Notes (Signed)
Subjective: Patient reports no headache  Objective: Vital signs in last 24 hours: Temp:  [97.8 F (36.6 C)-99.4 F (37.4 C)] 98.4 F (36.9 C) (05/08 0400) Pulse Rate:  [73-113] 83 (05/08 0730) Resp:  [15-28] 20 (05/08 0730) BP: (114-217)/(53-111) 170/84 (05/08 0730) SpO2:  [88 %-99 %] 98 % (05/08 0730)  Intake/Output from previous day: 05/07 0701 - 05/08 0700 In: 5787.4 [I.V.:5687.4; IV Piggyback:100] Out: 7350 [Urine:7350] Intake/Output this shift: No intake/output data recorded.  Awake, alert but cognitively slowed.  Oriented to [name], month, year. FC x 4. Full strength RUE/RLE.  4/5 LUE, 2/5 LLE.  Lab Results: Recent Labs    01/25/20 0544 01/26/20 0415  WBC 26.2* 23.1*  HGB 12.0* 11.9*  HCT 36.0* 35.3*  PLT 128* 169   BMET Recent Labs    01/25/20 2230 01/26/20 0415  NA 133* 133*  K 3.5 3.5  CL 94* 94*  CO2 28 27  GLUCOSE 178* 185*  BUN 9 10  CREATININE 0.62 0.67  CALCIUM 8.0* 8.0*    Studies/Results: VAS [place] TRANSCRANIAL DOPPLER  Result Date: 01/25/2020  Transcranial Doppler Indications: Subarachnoid hemorrhage. Limitations for diagnostic windows: Unable to insonate occipital window. Comparison Study: 01/23/2020 Performing Technologist: Oliver Hum RVT  Examination Guidelines: A complete evaluation includes B-mode imaging, spectral Doppler, color Doppler, and power Doppler as needed of all accessible portions of each vessel. Bilateral testing is considered an integral part of a complete examination. Limited examinations for reoccurring indications may be performed as noted.  +----------+-------------+----------+-----------+-------------------+ RIGHT TCD Right VM (cm)Depth (cm)Pulsatility      Comment       +----------+-------------+----------+-----------+-------------------+ MCA          107.00                 1.08                        +----------+-------------+----------+-----------+-------------------+ Term ICA      25.00                  2.21                        +----------+-------------+----------+-----------+-------------------+ PCA           39.00                 1.45                        +----------+-------------+----------+-----------+-------------------+ Opthalmic     24.00                 2.04                        +----------+-------------+----------+-----------+-------------------+ ICA siphon    66.00                 1.38                        +----------+-------------+----------+-----------+-------------------+ Vertebral                                   Unable to visualize +----------+-------------+----------+-----------+-------------------+  +----------+------------+----------+-----------+-------------------+ LEFT TCD  Left VM (cm)Depth (cm)Pulsatility      Comment       +----------+------------+----------+-----------+-------------------+ MCA          72.00  1.47                        +----------+------------+----------+-----------+-------------------+ Term ICA     30.00                 1.38                        +----------+------------+----------+-----------+-------------------+ PCA          32.00                 1.48                        +----------+------------+----------+-----------+-------------------+ Opthalmic    27.00                 1.98                        +----------+------------+----------+-----------+-------------------+ ICA siphon   59.00                  1.9                        +----------+------------+----------+-----------+-------------------+ Vertebral                                  Unable to visualize +----------+------------+----------+-----------+-------------------+  +------------+-------+-------------------+             VM cm/s      Comment       +------------+-------+-------------------+ Prox Basilar       Unable to visualize +------------+-------+-------------------+ Dist Basilar       Unable to  visualize +------------+-------+-------------------+    Preliminary     Assessment/Plan: SAH with vasospasm, with improved neurologic exam - per Dr. Conchita Paris, planning for hypertensive therapy until 5/10 at which point BP goals will be liberalized. - continue Amiodarone gtt - electrolyte repletion - cont Nimotop   Nicholas [name] 01/26/2020, 9:31 AM

## 2020-01-26 NOTE — Progress Notes (Signed)
eLink Physician-Brief Progress Note Patient Name: Nicholas [name] DOB: 04-06-1951 MRN: [id number]   Date of Service  01/26/2020  HPI/Events of Note  K 3.5.   eICU Interventions  Ordered 20 mEq KCl via central line, and another 40 mEq KCl PO.     Intervention Category Intermediate Interventions: Electrolyte abnormality - evaluation and management  Janae Bridgeman 01/26/2020, 7:48 PM

## 2020-01-26 NOTE — Progress Notes (Signed)
PULMONARY / CRITICAL CARE MEDICINE   NAME:  Nicholas [name], MRN:  [id number], DOB:  25-Jan-1951, LOS: 12 ADMISSION DATE:  01/14/2020, CONSULTATION DATE:  01/14/20 REFERRING MD: ED Physician, CHIEF COMPLAINT:  SAH  BRIEF HISTORY:    69 yo male who presented for 24 hours of progressive headache and nuchal rigidity with associated nausea, vomiting and confusion. Patient was transported by EMS to [facility] for imaging and transferred to [facility] for further evaluation.  Both CTA and catheter angiography negative for aneurysm due to presence of vasospasm. Severe symptomatic 3 vessel vasospasm and has responded reasonably well to chemical/mechanical angioplasty and hyperdynamic treatment.   SIGNIFICANT PAST MEDICAL HISTORY   He  has a past medical history of BPH (benign prostatic hypertrophy) (12/20/2011), Colon polyp, Deviated septum, Diabetes mellitus without complication (HCC), Hepatitis A, HSV-1 (herpes simplex virus 1) infection, Hyperlipidemia, Hypertension, and Migraine headache.  SIGNIFICANT EVENTS:  4/30 > Diagnostic cerebral angiogram with balloon angioplasty 4/30 5/3 > Pt is lying in bed in no acute distress, he is alert and oriented x3. Denies any acute complaints. Slowed responses but appropriate.  5/4 > No acute events overnight. Pt is lying comfortably in bed in no acute distress. Oriented to self, place, time with delayed responses. Remains on two vasopressors for escalated map goal per neuro/neurosx  STUDIES:   Echo 4/27 1. Normal LV systolic function; proximal septal thickening; grade 1  diastolic dysfunction; mildly dilated aortic root; scerotic aortic valve.  2. Left ventricular ejection fraction, by estimation, is 70 to 75%. The  left ventricle has hyperdynamic function. The left ventricle has no  regional wall motion abnormalities. There is mild left ventricular  hypertrophy of the basal segment. Left  ventricular diastolic parameters are consistent with Grade I diastolic   dysfunction (impaired relaxation).  3. Right ventricular systolic function is normal. The right ventricular  size is normal.  4. The mitral valve is normal in structure. No evidence of mitral valve  regurgitation. No evidence of mitral stenosis.  5. The aortic valve is tricuspid. Aortic valve regurgitation is not  visualized. Mild to moderate aortic valve sclerosis/calcification is  present, without any evidence of aortic stenosis.  6. Aortic dilatation noted. There is mild dilatation of the aortic root  measuring 38 mm.  7. The inferior vena cava is normal in size with greater than 50%  respiratory variability, suggesting right atrial pressure of 3 mmHg.   Transcranial Doppler 4/28 Elevated right middle cerebral and carotid siphon and left middle cerebral artery and carotid siphon mean flow velocities suggest mild vasospasm.  Slight improvement compared with prior TCD study 01/14/20   CT ANGIO HEAD W OR WO CONTRAST 01/18/2020  Overall decreased volume of subarachnoid hemorrhage since 01/15/2020.   New left subdural hygroma and subcentimeter rightward midline shift.   Resolution of hydrocephalus.   Further significantly diminished flow within the right ICA with no enhancement visualized at and beyond the cavernous segment.   Mild increased right M1 MCA narrowing and diminished distal right MCA branch filling.   New narrowing of the left A1 ACA with severe stenosis near the origin.   Persistent irregularity of the distal basilar artery with new superimposed severe stenosis.   IR PTA Vasospasm Initial IR PTA Vasospasm Add Diff IR ENDOVASC INTRACRANIAL INF OTHER THAN THROMBO ART INC DIAG ANGIO IR ENDOVASC INTRACRANIAL INF OTHER THAN THROMBO ART INC DIAG ANGIO EA ADD 01/18/2020  Severe vasospasm involving the supraclinoid segments of the internal carotid arteries bilaterally and the length  of the basilar artery.   There is significant improvement in vessel caliber and  distal territory perfusion after balloon angioplasty of the right internal carotid artery, right middle cerebral artery, and basilar artery.   TCD 5/1 Improvement in vasospasm overall but persistent elevated right middle  cerebral and left carotid siphon mean flow velocities suggestive of mild  vasospasm.   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  Thin left larger than right subdural collections with stable mild rightward midline shift.  Residual subarachnoid hemorrhage likely similar to prior CT.  Suboptimal vascular evaluation due to intrinsic T1 hyperintensity of hemorrhage. Improved flow within the intracranial right ICA compared to 01/18/2020 CTA.   Appearance is now similar to 01/14/2020 CTA with persistent severe stenosis of the supraclinoid portion. Likely persistent left A1 ACA stenosis and distal basilar stenosis.  TCD 5/3 Mildly elevated right middlle crebral and bilateral carotid siphon mean  flow velocities indicate mild vasospasm. Normal mean flow velocities in  remaining identified vessels of anterior and posterior cerebral circulations.Globally elevated pulsatility indices indicate increase in intracranial pressure likely.   TCD 5/4  Mildly elevated right middlle crebral and bilateral carotid siphon mean  flow velocities indicate mild vasospasm. Normal mean flow velocities in  remaining identified vessels of anterior and posterior cerebral circulations.Globally elevated pulsatility indices indicate increase in intracranial pressure likely.   5/4 MRA head/ MRI brain > 1. Mildly increased Left Subdural Hematoma since 01/19/2020, now up to 9 mm in thickness. Smaller right side subdural hematoma has regressed, now trace. 2. Associated mildly increased rightward midline shift, now 7 mm. 3. Stable moderate volume basilar cistern predominant Subarachnoid Hemorrhage with  no significant intraventricular hemorrhage and no ventriculomegaly. 4. Increased right hemisphere superior peri-Rolandic infarcts since 01/19/2020. Cytotoxic edema with no associated parenchymal hemorrhage or mass effect. 5. Otherwise stable multifocal right hemisphere ischemia, including at the right caudothalamic groove. 6. Intracranial MRA is more motion degraded today, but flow within the large vessels appear stable since 01/19/2020. Largely obscured second order and distal branches today.  TCD 5/5  Pending    CULTURES:  COVID 4/26 > negative MRSA PCR 4/26 > negative  BCx2 4/26 >> neg Urine culture 4/26: neg Blood culture 5/2: NGTD  ANTIBIOTICS:  4/26 vanc x 1 4/26 cefepime x1  LINES/TUBES:  CVC triple lumen catheter (left subclavian) 01/18/20 A-line 4/30 >> 5/1 Foley urinary catheter  CONSULTANTS:  Neurology, neurosurgery  SUBJECTIVE:   No issues overnight.  Still has complaints of neuromuscular pain in back and lower extremities lying in the bed.  Otherwise was able to get up to the chair yesterday.  Seen by neurosurgery this morning.  CONSTITUTIONAL: BP (!) 170/84   Pulse 83   Temp 98.4 F (36.9 C) (Axillary)   Resp 20   Ht 6' (1.829 m)   Wt 96.5 kg   SpO2 98%   BMI 28.85 kg/m   I/O last 3 completed shifts: In: 8659.6 [I.V.:8509.6; IV Piggyback:150] Out: 24401 [Urine:12050]  CVP:  [0 mmHg-11 mmHg] 6 mmHg    PHYSICAL EXAM: General: Pleasant elderly male, resting in bed on continuous vasopressor drips HEENT: NCAT, pupils reactive, right ptosis Neuro: Alert oriented x3 following commands, left upper extremity 2 out of 5. CV: Tachycardic, regular, S1-S2, maybe faint systolic murmur left lower sternal border PULM: Clear to auscultation bilaterally, no crackles no wheeze GI:   soft, nontender nondistended Extremities: Bilateral 1+ edema dependent upper and lower  extremities Skin: No rash  RESOLVED PROBLEM LIST  None  ASSESSMENT AND PLAN   Mr. Nicholas [name] is  a 69 yo with a history of hypertension, migraines, HSV-1, diabetes, HLD, hepatitis A, and BPH who was transported from [facility] after suddenly developing a severe headache and nuchal rigidity with associated left hemiplegia. CTA revealed large SAH in the basilar cisterns extending into bilateral sylvian fissures. SAH are most frequently due to rupture of aneurysm; however, no aneurysm was visualized on CTA nor catheter angiogram. He experienced clinical vasospasm of basilar, MCA, and right ICA s/p angioplasty. He is currently on [facility] treatment.  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 Follow strict I's and O's and regular BMP checks. Planned hypertensive therapy through 01/28/2020.  Low grade fevers - No fever over the last 12hrs. Most likely due to atelectasis vs neurogenic fever associated with SAH. Neurogenic fever can result from a disruption in the hypothalamic set point temperature -> abnormal increase in body temperature associated with injury to hypothalamus. P: Holding off on any antimicrobials at this time Continue to trend white blood cell count Chest x-ray as needed  Prolonged QT interval -EKG 5/6 with imporved QTc 436 -Holding fenofibrate per pharmacy recs given it can affect QTc, (triglycerides 124 on 5/1) P: Maintain electrolytes Continue to observe  Insulin dependent type 2 diabetes  -Home medications include Tresibas and  Levemir  -Hemoglobin A1C 01/14/2020 P: SSI plus Levemir Goal CBG less than 180  Thrombocytopenia -Concern for HIT, heparin on 4/30 and restarted 5/3, 4H score 4 P: Fondaparinux started on 01/23/2020 Continue to observe.  Hyponatremia  Hypokalemia P: Replete as needed  SUMMARY OF TODAY'S PLAN:  Continued observation, vasospasm watch in the intensive care unit on continuous vasopressors.  Best Practice / Goals of Care / Disposition.   Diet: heart healthy/ carb modified Pain/Anxiety/Delirium protocol (if indicated): PRNs VAP protocol (if indicated): Pulmonary hygiene  DVT prophylaxis: SCD, fondaparinux GI prophylaxis:PPI Glucose control: see above Mobility: Bedrest Code Status: Full FAMILY DISCUSSIONS: Per primary DISPOSITION: continue Neuro ICU care  LABS  Glucose Recent Labs  Lab 01/25/20 1058 01/25/20 1512 01/25/20 1909 01/25/20 2314 01/26/20 0314 01/26/20 0712  GLUCAP 249* 146* 172* 188* 194* 198*    BMET Recent Labs  Lab 01/25/20 1503 01/25/20 2230 01/26/20 0415  NA 134* 133* 133*  K 3.4* 3.5 3.5  CL 97* 94* 94*  CO2 27 28 27   BUN 13 9 10   CREATININE 0.75 0.62 0.67  GLUCOSE 138* 178* 185*    Liver Enzymes Recent Labs  Lab 01/24/20 0550  ALBUMIN 2.6*    Electrolytes Recent Labs  Lab 01/22/20 0532 01/22/20 0902 01/24/20 0550 01/24/20 1732 01/25/20 0544 01/25/20 0544 01/25/20 1503 01/25/20 2230 01/26/20 0415  CALCIUM 8.4*   < > 8.2*   < > 8.4*   < > 8.3* 8.0* 8.0*  MG  --    < > 1.7   < > 1.7  --   --  1.8 2.1  PHOS 2.7  --  2.5  --  2.7  --   --   --   --    < > =  values in this interval not displayed.    CBC Recent Labs  Lab 01/24/20 0550 01/25/20 0544 01/26/20 0415  WBC 24.3* 26.2* 23.1*  HGB 13.8 12.0* 11.9*  HCT 40.8 36.0* 35.3*  PLT 125* 128* 169    ABG No results for input(s): PHART, PCO2ART, PO2ART in the last 168 hours.  Coag's No results for input(s): APTT, INR in the last 168 hours.  Sepsis Markers Recent  Labs  Lab 01/21/20 1108 01/22/20 0532  PROCALCITON 0.27 0.27    Cardiac Enzymes No results for input(s): TROPONINI, PROBNP in the last 168 hours.   This patient is critically ill with multiple organ system failure; which, requires frequent high complexity decision making, assessment, support, evaluation, and titration of therapies. This was completed through the application of advanced monitoring technologies and extensive interpretation of multiple databases. During this encounter critical care time was devoted to patient care services described in this note for 31 minutes.  Josephine Igo, DO [facility] 01/26/2020 9:59 AM

## 2020-01-26 NOTE — Progress Notes (Signed)
Progress Note  Patient Name: Nicholas [name] Date of Encounter: 01/26/2020  Primary Cardiologist:  Chrystie Nose, MD  Subjective   No significant NSVT, but has some salvos and isolated PVC's overnight. Still driven to permissive hypertension. Potassium remains around 3.5 -target >4. Magnesium 2.1 today. Weight is climbing significantly - seems to be making a good amount of urine, though.  Inpatient Medications    Scheduled Meds: . bisacodyl  10 mg Oral Q0600  . Chlorhexidine Gluconate Cloth  6 each Topical Daily  . cholecalciferol  1,000 Units Oral Daily  . fondaparinux (ARIXTRA) injection  2.5 mg Subcutaneous Q0600  . insulin aspart  0-24 Units Subcutaneous Q4H  . insulin aspart  12 Units Subcutaneous TID WC  . insulin detemir  35 Units Subcutaneous QHS  . insulin detemir  40 Units Subcutaneous Daily  . levETIRAcetam  500 mg Oral BID  . magnesium hydroxide  30 mL Oral Daily  . midodrine  10 mg Oral TID WC  . niMODipine  30 mg Oral Q2H   Or  . niMODipine  30 mg Oral Q2H  . rosuvastatin  40 mg Oral Daily  . senna-docusate  1 tablet Oral BID  . sodium chloride flush  10-40 mL Intracatheter Q12H   Continuous Infusions: . sodium chloride 0 mL/hr at 01/14/20 1126  . 0.9 % NaCl with KCl 20 mEq / L 100 mL/hr at 01/26/20 0600  . amiodarone 30 mg/hr (01/26/20 0600)  . norepinephrine (LEVOPHED) Adult infusion 18 mcg/min (01/26/20 0739)  . phenylephrine (NEO-SYNEPHRINE) Adult infusion 400 mcg/min (01/26/20 0740)  . vasopressin (PITRESSIN) infusion - *FOR SHOCK* 0.04 Units/min (01/26/20 0600)   PRN Meds: sodium chloride, acetaminophen **OR** acetaminophen (TYLENOL) oral liquid 160 mg/5 mL **OR** acetaminophen, bisacodyl, HYDROmorphone (DILAUDID) injection, methocarbamol, oxyCODONE-acetaminophen, simethicone, sodium chloride flush   Vital Signs    Vitals:   01/26/20 0645 01/26/20 0700 01/26/20 0715 01/26/20 0730  BP: (!) 191/86 (!) 179/93 (!) 188/88 (!) 170/84  Pulse: 80 88 84 83    Resp: 18 (!) 25 (!) 23 20  Temp:      TempSrc:      SpO2: 99% 95% 98% 98%  Weight:      Height:        Intake/Output Summary (Last 24 hours) at 01/26/2020 0917 Last data filed at 01/26/2020 0700 Gross per 24 hour  Intake 5335.72 ml  Output 6550 ml  Net -1214.28 ml   Filed Weights   01/20/20 0446 01/21/20 0500 01/22/20 0500  Weight: 87.8 kg 90.3 kg 96.5 kg   Last Weight  Most recent update: 01/22/2020  5:38 AM   Weight  96.5 kg (212 lb 11.9 oz)           Weight change:    Telemetry    Sinus with PVC's - Personally Reviewed  ECG    N/A  Physical Exam   General: Well developed, well nourished, male appearing in mild-moderate distress. Head: R eye ptosis  Neck: Supple without bruits, JVD not elevated. Lungs:  Resp regular and unlabored, CTA. Heart: RRR, S1, S2, no S3, S4, 2/6 murmur; no rub. Abdomen: Soft, non-tender, non-distended with normoactive bowel sounds. No hepatomegaly. No rebound/guarding. No obvious abdominal masses. Extremities: No clubbing, cyanosis, no edema. Distal pedal pulses are 2+ bilaterally. Neuro: Alert and oriented X 2. L side weakness, can wiggle toes  Labs    Hematology Recent Labs  Lab 01/24/20 0550 01/25/20 0544 01/26/20 0415  WBC 24.3* 26.2* 23.1*  RBC 4.70 4.18*  4.08*  HGB 13.8 12.0* 11.9*  HCT 40.8 36.0* 35.3*  MCV 86.8 86.1 86.5  MCH 29.4 28.7 29.2  MCHC 33.8 33.3 33.7  RDW 13.6 13.7 14.0  PLT 125* 128* 169    Chemistry Recent Labs  Lab 01/24/20 0550 01/24/20 1732 01/25/20 1503 01/25/20 2230 01/26/20 0415  NA 136   < > 134* 133* 133*  K 2.9*   < > 3.4* 3.5 3.5  CL 100   < > 97* 94* 94*  CO2 28   < > 27 28 27   GLUCOSE 152*   < > 138* 178* 185*  BUN 15   < > 13 9 10   CREATININE 0.66   < > 0.75 0.62 0.67  CALCIUM 8.2*   < > 8.3* 8.0* 8.0*  ALBUMIN 2.6*  --   --   --   --   GFRNONAA >60   < > >60 >60 >60  GFRAA >60   < > >60 >60 >60  ANIONGAP 8   < > 10 11 12    < > = values in this interval not displayed.      High Sensitivity Troponin:  No results for input(s): TROPONINIHS in the last 720 hours.    BNPNo results for input(s): BNP, PROBNP in the last 168 hours.   Magnesium  Date Value Ref Range Status  01/26/2020 2.1 1.7 - 2.4 mg/dL Final    Comment:    Performed at [facility], [place] [address], [place], [place] [address]     Radiology    MR ANGIO HEAD WO CONTRAST  Result Date: 01/22/2020 CLINICAL DATA:  69 year old male who presented with acute headache on 01/14/2020 found to have subarachnoid hemorrhage and severe intracranial Vasospasm which was treated with balloon angioplasty on 01/18/2020. No intracranial aneurysm has been identified. 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 Dense left hemiplegia now. EXAM: MRI HEAD WITHOUT CONTRAST MRA HEAD WITHOUT CONTRAST TECHNIQUE: Multiplanar, multiecho pulse sequences of the brain and surrounding structures were obtained without intravenous contrast. Angiographic images of the head were obtained using MRA technique without contrast. COMPARISON:  Brain MRI and intracranial MRA 01/19/2020 and earlier. FINDINGS: MRI HEAD FINDINGS Brain: Left side subdural hematoma measures up to 9 mm thickness now on coronal images, previously 6-7 mm). Unchanged signal within the left subdural blood products, mildly heterogeneous FLAIR signal within the left subdural hematoma. Trace right subdural blood mostly along the parietal and occipital lobes appears decreased. Rightward midline shift has increased to 7 mm (previously 6 mm). No ventriculomegaly. And no significant intraventricular hemorrhage despite continued basilar cistern predominant subarachnoid blood which is best demonstrated on FLAIR and DWI. Scattered small foci of restricted diffusion are redemonstrated in the right hemisphere were new and/or increased cortical restricted diffusion in the posterior right frontal and right parietal lobes near the  perirolandic cortex on series 3, image 45 today. Compare to series 5, image 97 previously. Conspicuous restricted diffusion along the ventral right thalamus and right cauda thalamic groove has not significantly changed. No convincing abnormal diffusion in the left hemisphere or posterior fossa, and no other areas of significantly progressed diffusion abnormality. Associated gyriform cytotoxic edema in the right perirolandic cortex. No malignant hemorrhagic transformation identified. Negative cervicomedullary junction and pituitary. Vascular: Major intracranial vascular flow voids appears stable. Skull and upper cervical spine: Negative visible cervical spine, bone marrow signal. Sinuses/Orbits: Negative orbits. Stable mild paranasal sinus mucosal thickening. Other: Stable trace mastoid fluid. MRA HEAD  FINDINGS Intracranial MRA today is more motion degraded in addition to some vessel obscuration due to the T1 intrinsic subarachnoid blood. Antegrade flow appears stable in the distal vertebral arteries and basilar. No convincing vertebrobasilar stenosis. As before fetal type PCA origins are suspected, more so the left. Largely obscured PCA branch detail today. Antegrade flow in both ICA siphons appears stable. Both carotid termini appear patent. The right ACA A1 appears to be dominant as before. Both MCA M1 segments are patent. But bilateral MCA and ACA branch detail is largely obscured today. IMPRESSION: 1. Mildly increased Left Subdural Hematoma since 01/19/2020, now up to 9 mm in thickness. Smaller right side subdural hematoma has regressed, now trace. 2. Associated mildly increased rightward midline shift, now 7 mm. 3. Stable moderate volume basilar cistern predominant Subarachnoid Hemorrhage with no significant intraventricular hemorrhage and no ventriculomegaly. 4. Increased right hemisphere superior peri-Rolandic infarcts since 01/19/2020. Cytotoxic edema with no associated parenchymal hemorrhage or mass effect.  5. Otherwise stable multifocal right hemisphere ischemia, including at the right caudothalamic groove. 6. Intracranial MRA is more motion degraded today, but flow within the large vessels appear stable since 01/19/2020. Largely obscured second order and distal branches today. Electronically Signed   By: Odessa Fleming M.D.   On: 01/22/2020 15:22   MR BRAIN WO CONTRAST  Result Date: 01/22/2020 CLINICAL DATA:  69 year old male who presented with acute headache on 01/14/2020 found to have subarachnoid hemorrhage and severe intracranial Vasospasm which was treated with balloon angioplasty on 01/18/2020. No intracranial aneurysm has been identified. 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 Dense left hemiplegia now. EXAM: MRI HEAD WITHOUT CONTRAST MRA HEAD WITHOUT CONTRAST TECHNIQUE: Multiplanar, multiecho pulse sequences of the brain and surrounding structures were obtained without intravenous contrast. Angiographic images of the head were obtained using MRA technique without contrast. COMPARISON:  Brain MRI and intracranial MRA 01/19/2020 and earlier. FINDINGS: MRI HEAD FINDINGS Brain: Left side subdural hematoma measures up to 9 mm thickness now on coronal images, previously 6-7 mm). Unchanged signal within the left subdural blood products, mildly heterogeneous FLAIR signal within the left subdural hematoma. Trace right subdural blood mostly along the parietal and occipital lobes appears decreased. Rightward midline shift has increased to 7 mm (previously 6 mm). No ventriculomegaly. And no significant intraventricular hemorrhage despite continued basilar cistern predominant subarachnoid blood which is best demonstrated on FLAIR and DWI. Scattered small foci of restricted diffusion are redemonstrated in the right hemisphere were new and/or increased cortical restricted diffusion in the posterior right frontal and right parietal lobes near the perirolandic cortex on  series 3, image 45 today. Compare to series 5, image 97 previously. Conspicuous restricted diffusion along the ventral right thalamus and right cauda thalamic groove has not significantly changed. No convincing abnormal diffusion in the left hemisphere or posterior fossa, and no other areas of significantly progressed diffusion abnormality. Associated gyriform cytotoxic edema in the right perirolandic cortex. No malignant hemorrhagic transformation identified. Negative cervicomedullary junction and pituitary. Vascular: Major intracranial vascular flow voids appears stable. Skull and upper cervical spine: Negative visible cervical spine, bone marrow signal. Sinuses/Orbits: Negative orbits. Stable mild paranasal sinus mucosal thickening. Other: Stable trace mastoid fluid. MRA HEAD FINDINGS Intracranial MRA today is more motion degraded in addition to some vessel obscuration due to the T1 intrinsic subarachnoid blood. Antegrade flow appears stable in the distal vertebral arteries and basilar. No convincing vertebrobasilar stenosis. As before fetal type PCA origins are suspected, more so the left.  Largely obscured PCA branch detail today. Antegrade flow in both ICA siphons appears stable. Both carotid termini appear patent. The right ACA A1 appears to be dominant as before. Both MCA M1 segments are patent. But bilateral MCA and ACA branch detail is largely obscured today. IMPRESSION: 1. Mildly increased Left Subdural Hematoma since 01/19/2020, now up to 9 mm in thickness. Smaller right side subdural hematoma has regressed, now trace. 2. Associated mildly increased rightward midline shift, now 7 mm. 3. Stable moderate volume basilar cistern predominant Subarachnoid Hemorrhage with no significant intraventricular hemorrhage and no ventriculomegaly. 4. Increased right hemisphere superior peri-Rolandic infarcts since 01/19/2020. Cytotoxic edema with no associated parenchymal hemorrhage or mass effect. 5. Otherwise stable  multifocal right hemisphere ischemia, including at the right caudothalamic groove. 6. Intracranial MRA is more motion degraded today, but flow within the large vessels appear stable since 01/19/2020. Largely obscured second order and distal branches today. Electronically Signed   By: Odessa Fleming M.D.   On: 01/22/2020 15:22   VAS [place] TRANSCRANIAL DOPPLER  Result Date: 01/25/2020  Transcranial Doppler Indications: Subarachnoid hemorrhage. Limitations for diagnostic windows: Unable to insonate occipital window. Comparison Study: 01/23/2020 Performing Technologist: Chanda Busing RVT  Examination Guidelines: A complete evaluation includes B-mode imaging, spectral Doppler, color Doppler, and power Doppler as needed of all accessible portions of each vessel. Bilateral testing is considered an integral part of a complete examination. Limited examinations for reoccurring indications may be performed as noted.  +----------+-------------+----------+-----------+-------------------+ RIGHT TCD Right VM (cm)Depth (cm)Pulsatility      Comment       +----------+-------------+----------+-----------+-------------------+ MCA          107.00                 1.08                        +----------+-------------+----------+-----------+-------------------+ Term ICA      25.00                 2.21                        +----------+-------------+----------+-----------+-------------------+ PCA           39.00                 1.45                        +----------+-------------+----------+-----------+-------------------+ Opthalmic     24.00                 2.04                        +----------+-------------+----------+-----------+-------------------+ ICA siphon    66.00                 1.38                        +----------+-------------+----------+-----------+-------------------+ Vertebral                                   Unable to visualize  +----------+-------------+----------+-----------+-------------------+  +----------+------------+----------+-----------+-------------------+ LEFT TCD  Left VM (cm)Depth (cm)Pulsatility      Comment       +----------+------------+----------+-----------+-------------------+ MCA          72.00  1.47                        +----------+------------+----------+-----------+-------------------+ Term ICA     30.00                 1.38                        +----------+------------+----------+-----------+-------------------+ PCA          32.00                 1.48                        +----------+------------+----------+-----------+-------------------+ Opthalmic    27.00                 1.98                        +----------+------------+----------+-----------+-------------------+ ICA siphon   59.00                  1.9                        +----------+------------+----------+-----------+-------------------+ Vertebral                                  Unable to visualize +----------+------------+----------+-----------+-------------------+  +------------+-------+-------------------+             VM cm/s      Comment       +------------+-------+-------------------+ Prox Basilar       Unable to visualize +------------+-------+-------------------+ Dist Basilar       Unable to visualize +------------+-------+-------------------+    Preliminary    VAS US TRANSCRANIAL DOPPLER  Result Date: 01/24/2020  Transcranial Doppler Indications: Subarachnoid hemorrhage. Performing Technologist: Jeb LeveringJill Parker RDMS, RVT  Examination Guidelines: A complete evaluation includes B-mode imaging, spectral Doppler, color Doppler, and power Doppler as needed of all accessible portions of each vessel. Bilateral testing is considered an integral part of a complete examination. Limited examinations for reoccurring indications may be performed as noted.   +----------+-------------+----------+-----------+--------------+ RIGHT TCD Right VM (cm)Depth (cm)Pulsatility   Comment     +----------+-------------+----------+-----------+--------------+ MCA          194.00                 1.15                   +----------+-------------+----------+-----------+--------------+ ACA          -80.00                 0.54                   +----------+-------------+----------+-----------+--------------+ Term ICA                                    not visualized +----------+-------------+----------+-----------+--------------+ PCA           55.00                 1.27                   +----------+-------------+----------+-----------+--------------+ Opthalmic     28.00  1.62                   +----------+-------------+----------+-----------+--------------+ ICA siphon    66.00                 1.03                   +----------+-------------+----------+-----------+--------------+ Vertebral    -37.00                 1.05                   +----------+-------------+----------+-----------+--------------+  +----------+------------+----------+-----------+--------------+ LEFT TCD  Left VM (cm)Depth (cm)Pulsatility   Comment     +----------+------------+----------+-----------+--------------+ MCA          81.00                 1.43                   +----------+------------+----------+-----------+--------------+ ACA          -87.00                1.21                   +----------+------------+----------+-----------+--------------+ PCA          34.00                 1.46                   +----------+------------+----------+-----------+--------------+ Opthalmic    26.00                  2.3                   +----------+------------+----------+-----------+--------------+ ICA siphon   80.00                 1.81                   +----------+------------+----------+-----------+--------------+ Vertebral                                   not visualized +----------+------------+----------+-----------+--------------+  +------------+-------+--------------+             VM cm/s   Comment     +------------+-------+--------------+ Prox Basilar       not visualized +------------+-------+--------------+ Summary:  Elevated right middle cerebral artery mean flow velocities suggest moderate and worsening vasospasm. Mildly elevated left middle cerebral, and left carotid siphon mean flow velocities of unclear significance. *See table(s) above for TCD measurements and observations.  Diagnosing physician: Delia Heady MD Electronically signed by Delia Heady MD on 01/24/2020 at 8:15:01 AM.    Final      Cardiac Studies   ECHO:  01/15/2020 1. Normal LV systolic function; proximal septal thickening; grade 1  diastolic dysfunction; mildly dilated aortic root; scerotic aortic valve.  2. Left ventricular ejection fraction, by estimation, is 70 to 75%. The  left ventricle has hyperdynamic function. The left ventricle has no  regional wall motion abnormalities. There is mild left ventricular  hypertrophy of the basal segment. Left  ventricular diastolic parameters are consistent with Grade I diastolic  dysfunction (impaired relaxation).  3. Right ventricular systolic function is normal. The right ventricular  size is normal.  4. The mitral valve is normal in structure. No evidence of mitral valve  regurgitation. No evidence of mitral stenosis.  5.  The aortic valve is tricuspid. Aortic valve regurgitation is not  visualized. Mild to moderate aortic valve sclerosis/calcification is  present, without any evidence of aortic stenosis.  6. Aortic dilatation noted. There is mild dilatation of the aortic root  measuring 38 mm.  7. The inferior vena cava is normal in size with greater than 50%  respiratory variability, suggesting right atrial pressure of 3 mmHg.   Patient Profile     69 y.o. male w/ hx  BPH, colon polyp, deviated septum, DM2, hepatitis A, HSV-1 infection, HLD, HTN, migraine headache was admitted 04/26 w/ SAH, clinical vasospasm of basilar, MCA and right ICA s/p angioplasty. Because of the vasospasm, permissive HTN w/ SBP goal 180-220.  Assessment & Plan    Continue IV amiodarone for arrhythmia suppression. Keep Mg>2 and K>4. If possible minimize levophed, in favor of vasopressin and neosynephrine to maintain permissive hypertension and decrease B agonist exposure. Echo on 4/27 showed hyperdynamic LVEF - weight is going up, but seems to be managing I/O's by auto-diuresis, therefore, CHF is not suspected.  Chrystie Nose, MD, [facility], FACP  [place]  [facility]  Medical Director of the [facility]  [facility] Diplomate of the American Board of Clinical Lipidology Attending Cardiologist  Direct Dial: [phone]  Fax: [phone]  Website:  [email or web address] 9:17 AM 01/26/2020

## 2020-01-27 ENCOUNTER — Other Ambulatory Visit (HOSPITAL_COMMUNITY): Payer: BC Managed Care – PPO

## 2020-01-27 LAB — BASIC METABOLIC PANEL
Anion gap: 10 (ref 5–15)
BUN: 11 mg/dL (ref 8–23)
CO2: 27 mmol/L (ref 22–32)
Calcium: 8.2 mg/dL — ABNORMAL LOW (ref 8.9–10.3)
Chloride: 96 mmol/L — ABNORMAL LOW (ref 98–111)
Creatinine, Ser: 0.56 mg/dL — ABNORMAL LOW (ref 0.61–1.24)
GFR calc Af Amer: >60 mL/min (ref >60)
GFR calc non Af Amer: >60 mL/min (ref >60)
Glucose, Bld: 158 mg/dL — ABNORMAL HIGH (ref 70–99)
Potassium: 3.8 mmol/L (ref 3.5–5.1)
Sodium: 133 mmol/L — ABNORMAL LOW (ref 135–145)

## 2020-01-27 LAB — MAGNESIUM: Magnesium: 1.9 mg/dL (ref 1.7–2.4)

## 2020-01-27 LAB — GLUCOSE, CAPILLARY
Glucose-Capillary: 128 mg/dL — ABNORMAL HIGH (ref 70–99)
Glucose-Capillary: 133 mg/dL — ABNORMAL HIGH (ref 70–99)
Glucose-Capillary: 160 mg/dL — ABNORMAL HIGH (ref 70–99)
Glucose-Capillary: 168 mg/dL — ABNORMAL HIGH (ref 70–99)
Glucose-Capillary: 186 mg/dL — ABNORMAL HIGH (ref 70–99)
Glucose-Capillary: 190 mg/dL — ABNORMAL HIGH (ref 70–99)

## 2020-01-27 MED ORDER — POTASSIUM CHLORIDE 20 MEQ/15ML (10%) PO SOLN
40.0000 meq | Freq: Once | ORAL | Status: AC
  Administered 2020-01-27: 40 meq via ORAL
  Filled 2020-01-27: qty 30

## 2020-01-27 MED ORDER — POTASSIUM CHLORIDE 10 MEQ/50ML IV SOLN
10.0000 meq | INTRAVENOUS | Status: AC
  Administered 2020-01-27 (×2): 10 meq via INTRAVENOUS
  Filled 2020-01-27 (×2): qty 50

## 2020-01-27 MED ORDER — MAGNESIUM SULFATE 2 GM/50ML IV SOLN
2.0000 g | Freq: Once | INTRAVENOUS | Status: AC
  Administered 2020-01-27: 10:00:00 2 g via INTRAVENOUS
  Filled 2020-01-27: qty 50

## 2020-01-27 MED ORDER — AMIODARONE HCL 200 MG PO TABS
400.0000 mg | ORAL_TABLET | Freq: Every day | ORAL | Status: DC
  Administered 2020-01-27 – 2020-01-29 (×3): 400 mg via ORAL
  Filled 2020-01-27 (×4): qty 2

## 2020-01-27 NOTE — Progress Notes (Addendum)
PULMONARY / CRITICAL CARE MEDICINE   NAME:  ADOLF ORMISTON, MRN:  [id number], DOB:  [birth year], LOS: 65 ADMISSION DATE:  01/14/2020, CONSULTATION DATE:  01/14/20 REFERRING MD: ED Physician, CHIEF COMPLAINT:  SAH  BRIEF HISTORY:    69 yo male who presented for 24 hours of progressive headache and nuchal rigidity with associated nausea, vomiting and confusion. Patient was transported by EMS to [facility] for imaging and transferred to [facility] for further evaluation.  Both CTA and catheter angiography negative for aneurysm due to presence of vasospasm. Severe symptomatic 3 vessel vasospasm and has responded reasonably well to chemical/mechanical angioplasty and hyperdynamic treatment.   SIGNIFICANT PAST MEDICAL HISTORY   He  has a past medical history of BPH (benign prostatic hypertrophy) (12/20/2011), Colon polyp, Deviated septum, Diabetes mellitus without complication ([place]), Hepatitis A, HSV-1 (herpes simplex virus 1) infection, Hyperlipidemia, Hypertension, and Migraine headache.  SIGNIFICANT EVENTS:  4/30 > Diagnostic cerebral angiogram with balloon angioplasty 4/30 5/3 > Pt is lying in bed in no acute distress, he is alert and oriented x3. Denies any acute complaints. Slowed responses but appropriate.  5/4 > No acute events overnight. Pt is lying comfortably in bed in no acute distress. Oriented to self, place, time with delayed responses. Remains on two vasopressors for escalated map goal per neuro/neurosx  STUDIES:   Echo 4/27 1. Normal LV systolic function; proximal septal thickening; grade 1  diastolic dysfunction; mildly dilated aortic root; scerotic aortic valve.  2. Left ventricular ejection fraction, by estimation, is 70 to 75%. The  left ventricle has hyperdynamic function. The left ventricle has no  regional wall motion abnormalities. There is mild left ventricular  hypertrophy of the basal segment. Left  ventricular diastolic parameters are consistent with Grade I diastolic   dysfunction (impaired relaxation).  3. Right ventricular systolic function is normal. The right ventricular  size is normal.  4. The mitral valve is normal in structure. No evidence of mitral valve  regurgitation. No evidence of mitral stenosis.  5. The aortic valve is tricuspid. Aortic valve regurgitation is not  visualized. Mild to moderate aortic valve sclerosis/calcification is  present, without any evidence of aortic stenosis.  6. Aortic dilatation noted. There is mild dilatation of the aortic root  measuring 38 mm.  7. The inferior vena cava is normal in size with greater than 50%  respiratory variability, suggesting right atrial pressure of 3 mmHg.   Transcranial Doppler 4/28 Elevated right middle cerebral and carotid siphon and left middle cerebral artery and carotid siphon mean flow velocities suggest mild vasospasm.  Slight improvement compared with prior TCD study 01/14/20   CT ANGIO HEAD W OR WO CONTRAST 01/18/2020  Overall decreased volume of subarachnoid hemorrhage since 01/15/2020.   New left subdural hygroma and subcentimeter rightward midline shift.   Resolution of hydrocephalus.   Further significantly diminished flow within the right ICA with no enhancement visualized at and beyond the cavernous segment.   Mild increased right M1 MCA narrowing and diminished distal right MCA branch filling.   New narrowing of the left A1 ACA with severe stenosis near the origin.   Persistent irregularity of the distal basilar artery with new superimposed severe stenosis.   IR PTA Vasospasm Initial IR PTA Vasospasm Add Diff IR ENDOVASC INTRACRANIAL INF OTHER THAN THROMBO ART INC DIAG ANGIO IR ENDOVASC INTRACRANIAL INF OTHER THAN THROMBO ART INC DIAG ANGIO EA ADD 01/18/2020  Severe vasospasm involving the supraclinoid segments of the internal carotid arteries bilaterally and the length  of the basilar artery.   There is significant improvement in vessel caliber and  distal territory perfusion after balloon angioplasty of the right internal carotid artery, right middle cerebral artery, and basilar artery.   TCD 5/1 Improvement in vasospasm overall but persistent elevated right middle  cerebral and left carotid siphon mean flow velocities suggestive of mild  vasospasm.   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  Thin left larger than right subdural collections with stable mild rightward midline shift.  Residual subarachnoid hemorrhage likely similar to prior CT.  Suboptimal vascular evaluation due to intrinsic T1 hyperintensity of hemorrhage. Improved flow within the intracranial right ICA compared to 01/18/2020 CTA.   Appearance is now similar to 01/14/2020 CTA with persistent severe stenosis of the supraclinoid portion. Likely persistent left A1 ACA stenosis and distal basilar stenosis.  TCD 5/3 Mildly elevated right middlle crebral and bilateral carotid siphon mean  flow velocities indicate mild vasospasm. Normal mean flow velocities in  remaining identified vessels of anterior and posterior cerebral circulations.Globally elevated pulsatility indices indicate increase in intracranial pressure likely.   TCD 5/4  Mildly elevated right middlle crebral and bilateral carotid siphon mean  flow velocities indicate mild vasospasm. Normal mean flow velocities in  remaining identified vessels of anterior and posterior cerebral circulations.Globally elevated pulsatility indices indicate increase in intracranial pressure likely.   5/4 MRA head/ MRI brain > 1. Mildly increased Left Subdural Hematoma since 01/19/2020, now up to 9 mm in thickness. Smaller right side subdural hematoma has regressed, now trace. 2. Associated mildly increased rightward midline shift, now 7 mm. 3. Stable moderate volume basilar cistern predominant Subarachnoid Hemorrhage with  no significant intraventricular hemorrhage and no ventriculomegaly. 4. Increased right hemisphere superior peri-Rolandic infarcts since 01/19/2020. Cytotoxic edema with no associated parenchymal hemorrhage or mass effect. 5. Otherwise stable multifocal right hemisphere ischemia, including at the right caudothalamic groove. 6. Intracranial MRA is more motion degraded today, but flow within the large vessels appear stable since 01/19/2020. Largely obscured second order and distal branches today.  TCD 5/5  Pending    CULTURES:  COVID 4/26 > negative MRSA PCR 4/26 > negative  BCx2 4/26 >> neg Urine culture 4/26: neg Blood culture 5/2: NGTD  ANTIBIOTICS:  4/26 vanc x 1 4/26 cefepime x1  LINES/TUBES:  CVC triple lumen catheter (left subclavian) 01/18/20 A-line 4/30 >> 5/1 Foley urinary catheter  CONSULTANTS:  Neurology, neurosurgery  SUBJECTIVE:   Delirious this AM. Still having back and neck pain. No problems per nursing.  CONSTITUTIONAL: BP (!) 209/91   Pulse 86   Temp 99.8 F (37.7 C)   Resp (!) 21   Ht 6' (1.829 m)   Wt 96.5 kg   SpO2 95%   BMI 28.85 kg/m   I/O last 3 completed shifts: In: 9063.9 [I.V.:8878.1; IV Piggyback:185.7] Out: 01779 [Urine:10350]  CVP:  [4 mmHg-10 mmHg] 10 mmHg    PHYSICAL EXAM: GEN: no acute distress HEENT: R eye closed but can open to command, trachea midline, MMM CV: RRR, ext warm PULM: Clear without wheezing or rhonci GI: Protuberant but + BS EXT: No edema NEURO: Moves right side briskly to command, some left sided weakness persists PSYCH: RASS 0, confused but speaking in full sentences, no aphasia SKIN: No rashes   RESOLVED PROBLEM LIST  None  ASSESSMENT AND PLAN   Nicholas [name] is a 69 yo with a history of hypertension, migraines, HSV-1, diabetes, HLD,  hepatitis A, and BPH who was transported from [facility] after suddenly developing a severe headache and nuchal rigidity with associated left hemiplegia. CTA  revealed large SAH in the basilar cisterns extending into bilateral sylvian fissures. SAH are most frequently due to rupture of aneurysm; however, no aneurysm was visualized on CTA nor catheter angiogram. He experienced clinical vasospasm of basilar, MCA, and right ICA s/p angioplasty. He is currently on [facility] treatment.  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 Follow strict I's and O's and regular BMP checks. Planned hypertensive therapy through 01/28/2020.  Low grade fevers - Improved. Most likely due to atelectasis vs neurogenic fever associated with SAH. Neurogenic fever can result from a disruption in the hypothalamic set point temperature -> abnormal increase in body temperature associated with injury to hypothalamus. P: Holding off on any antimicrobials at this time Continue to trend white blood cell count Chest x-ray as needed  Prolonged QT interval -EKG 5/6 with imporved QTc 436 -Holding fenofibrate per pharmacy recs given it can affect QTc, (triglycerides 124 on 5/1) P: Maintain electrolytes: replete K/Mg Continue to observe  High PVC burden- on amio drip - Would consider switching to PO or just D/C'ing, this is all likely reactive to vasopressors.  Insulin dependent type 2 diabetes  -Home medications include Tresibas and Levemir  -Hemoglobin A1C 01/14/2020 P: SSI plus Levemir Goal CBG less than 180  Thrombocytopenia- improved.  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 - Continue fondaparinux  SUMMARY OF TODAY'S PLAN:  Continued observation, vasospasm watch in the intensive care unit on continuous vasopressors.  Best Practice / Goals of Care / Disposition.   Diet: heart healthy/ carb modified Pain/Anxiety/Delirium protocol (if indicated): PRNs VAP protocol (if indicated): Pulmonary hygiene  DVT prophylaxis: SCD, fondaparinux GI prophylaxis:PPI Glucose control: see above Mobility: Bedrest Code Status: Full FAMILY DISCUSSIONS: Per primary DISPOSITION: continue Neuro ICU care  Myrla Halsted MD PCCM

## 2020-01-27 NOTE — Progress Notes (Signed)
Progress Note  Patient Name: Nicholas [name] Date of Encounter: 01/27/2020  Primary Cardiologist:  Pixie Casino, MD  Subjective   No significant NSVT - still with isolated PVC's as well as some ventricular trigeminy. Asymptomatic.  Inpatient Medications    Scheduled Meds: . bisacodyl  10 mg Oral Q0600  . Chlorhexidine Gluconate Cloth  6 each Topical Daily  . cholecalciferol  1,000 Units Oral Daily  . fondaparinux (ARIXTRA) injection  2.5 mg Subcutaneous Q0600  . insulin aspart  0-24 Units Subcutaneous Q4H  . insulin aspart  12 Units Subcutaneous TID WC  . insulin detemir  35 Units Subcutaneous QHS  . insulin detemir  40 Units Subcutaneous Daily  . levETIRAcetam  500 mg Oral BID  . magnesium hydroxide  30 mL Oral Daily  . midodrine  10 mg Oral TID WC  . niMODipine  30 mg Oral Q2H   Or  . niMODipine  30 mg Oral Q2H  . potassium chloride  40 mEq Oral Once  . rosuvastatin  40 mg Oral Daily  . senna-docusate  1 tablet Oral BID  . sodium chloride flush  10-40 mL Intracatheter Q12H   Continuous Infusions: . sodium chloride 0 mL/hr at 01/14/20 1126  . 0.9 % NaCl with KCl 20 mEq / L 100 mL/hr at 01/27/20 0800  . amiodarone 30 mg/hr (01/27/20 0800)  . magnesium sulfate bolus IVPB    . norepinephrine (LEVOPHED) Adult infusion 12 mcg/min (01/27/20 0800)  . phenylephrine (NEO-SYNEPHRINE) Adult infusion 400 mcg/min (01/27/20 0800)  . potassium chloride    . vasopressin (PITRESSIN) infusion - *FOR SHOCK* 0.04 Units/min (01/27/20 0800)   PRN Meds: sodium chloride, acetaminophen **OR** acetaminophen (TYLENOL) oral liquid 160 mg/5 mL **OR** acetaminophen, bisacodyl, HYDROmorphone (DILAUDID) injection, methocarbamol, oxyCODONE-acetaminophen, simethicone, sodium chloride flush   Vital Signs    Vitals:   01/27/20 0715 01/27/20 0730 01/27/20 0745 01/27/20 0800  BP: (!) 201/89 (!) 215/100 (!) 202/85 (!) 209/91  Pulse: 87 (!) 101 87 86  Resp: (!) 21 (!) 21 (!) 24 (!) 21  Temp:        TempSrc:      SpO2: 93% 95% 96% 95%  Weight:      Height:        Intake/Output Summary (Last 24 hours) at 01/27/2020 1011 Last data filed at 01/27/2020 0800 Gross per 24 hour  Intake 5616.38 ml  Output 5850 ml  Net -233.62 ml   Filed Weights   01/20/20 0446 01/21/20 0500 01/22/20 0500  Weight: 87.8 kg 90.3 kg 96.5 kg   Last Weight  Most recent update: 01/22/2020  5:38 AM   Weight  96.5 kg (212 lb 11.9 oz)           Weight change:    Telemetry    Sinus with PVC's - Personally Reviewed  ECG    N/A  Physical Exam   General: Well developed, well nourished, male appearing in mild-moderate distress. Head: R eye ptosis  Neck: Supple without bruits, JVD not elevated. Lungs:  Resp regular and unlabored, CTA. Heart: RRR, S1, S2, no S3, S4, 2/6 murmur; no rub. Abdomen: Soft, non-tender, non-distended with normoactive bowel sounds. No hepatomegaly. No rebound/guarding. No obvious abdominal masses. Extremities: No clubbing, cyanosis, no edema. Distal pedal pulses are 2+ bilaterally. Neuro: Alert and oriented X 2. L side weakness, can wiggle toes  Labs    Hematology Recent Labs  Lab 01/24/20 0550 01/25/20 0544 01/26/20 0415  WBC 24.3* 26.2* 23.1*  RBC 4.70  4.18* 4.08*  HGB 13.8 12.0* 11.9*  HCT 40.8 36.0* 35.3*  MCV 86.8 86.1 86.5  MCH 29.4 28.7 29.2  MCHC 33.8 33.3 33.7  RDW 13.6 13.7 14.0  PLT 125* 128* 169    Chemistry Recent Labs  Lab 01/24/20 0550 01/24/20 1732 01/26/20 0415 01/26/20 1421 01/27/20 0600  NA 136   < > 133* 132* 133*  K 2.9*   < > 3.5 3.5 3.8  CL 100   < > 94* 95* 96*  CO2 28   < > 27 28 27   GLUCOSE 152*   < > 185* 125* 158*  BUN 15   < > 10 8 11   CREATININE 0.66   < > 0.67 0.57* 0.56*  CALCIUM 8.2*   < > 8.0* 8.0* 8.2*  ALBUMIN 2.6*  --   --   --   --   GFRNONAA >60   < > >60 >60 >60  GFRAA >60   < > >60 >60 >60  ANIONGAP 8   < > 12 9 10    < > = values in this interval not displayed.     High Sensitivity Troponin:  No results  for input(s): TROPONINIHS in the last 720 hours.    BNPNo results for input(s): BNP, PROBNP in the last 168 hours.   Magnesium  Date Value Ref Range Status  01/27/2020 1.9 1.7 - 2.4 mg/dL Final    Comment:    Performed at [facility], [address] [place], [facility] [address]     Radiology    VAS [place] TRANSCRANIAL DOPPLER  Result Date: 01/25/2020  Transcranial Doppler Indications: Subarachnoid hemorrhage. Limitations for diagnostic windows: Unable to insonate occipital window. Comparison Study: 01/23/2020 Performing Technologist: [place] RVT  Examination Guidelines: A complete evaluation includes B-mode imaging, spectral Doppler, color Doppler, and power Doppler as needed of all accessible portions of each vessel. Bilateral testing is considered an integral part of a complete examination. Limited examinations for reoccurring indications may be performed as noted.  +----------+-------------+----------+-----------+-------------------+ RIGHT TCD Right VM (cm)Depth (cm)Pulsatility      Comment       +----------+-------------+----------+-----------+-------------------+ MCA          107.00                 1.08                        +----------+-------------+----------+-----------+-------------------+ Term ICA      25.00                 2.21                        +----------+-------------+----------+-----------+-------------------+ PCA           39.00                 1.45                        +----------+-------------+----------+-----------+-------------------+ Opthalmic     24.00                 2.04                        +----------+-------------+----------+-----------+-------------------+ ICA siphon    66.00                 1.38                        +----------+-------------+----------+-----------+-------------------+  Vertebral                                   Unable to visualize  +----------+-------------+----------+-----------+-------------------+  +----------+------------+----------+-----------+-------------------+ LEFT TCD  Left VM (cm)Depth (cm)Pulsatility      Comment       +----------+------------+----------+-----------+-------------------+ MCA          72.00                 1.47                        +----------+------------+----------+-----------+-------------------+ Term ICA     30.00                 1.38                        +----------+------------+----------+-----------+-------------------+ PCA          32.00                 1.48                        +----------+------------+----------+-----------+-------------------+ Opthalmic    27.00                 1.98                        +----------+------------+----------+-----------+-------------------+ ICA siphon   59.00                  1.9                        +----------+------------+----------+-----------+-------------------+ Vertebral                                  Unable to visualize +----------+------------+----------+-----------+-------------------+  +------------+-------+-------------------+             VM cm/s      Comment       +------------+-------+-------------------+ Prox Basilar       Unable to visualize +------------+-------+-------------------+ Dist Basilar       Unable to visualize +------------+-------+-------------------+    Preliminary    VAS [place] TRANSCRANIAL DOPPLER  Result Date: 01/24/2020  Transcranial Doppler Indications: Subarachnoid hemorrhage. Performing Technologist: Jeb Levering RDMS, RVT  Examination Guidelines: A complete evaluation includes B-mode imaging, spectral Doppler, color Doppler, and power Doppler as needed of all accessible portions of each vessel. Bilateral testing is considered an integral part of a complete examination. Limited examinations for reoccurring indications may be performed as noted.   +----------+-------------+----------+-----------+--------------+ RIGHT TCD Right VM (cm)Depth (cm)Pulsatility   Comment     +----------+-------------+----------+-----------+--------------+ MCA          194.00                 1.15                   +----------+-------------+----------+-----------+--------------+ ACA          -80.00                 0.54                   +----------+-------------+----------+-----------+--------------+ Term ICA  not visualized +----------+-------------+----------+-----------+--------------+ PCA           55.00                 1.27                   +----------+-------------+----------+-----------+--------------+ Opthalmic     28.00                 1.62                   +----------+-------------+----------+-----------+--------------+ ICA siphon    66.00                 1.03                   +----------+-------------+----------+-----------+--------------+ Vertebral    -37.00                 1.05                   +----------+-------------+----------+-----------+--------------+  +----------+------------+----------+-----------+--------------+ LEFT TCD  Left VM (cm)Depth (cm)Pulsatility   Comment     +----------+------------+----------+-----------+--------------+ MCA          81.00                 1.43                   +----------+------------+----------+-----------+--------------+ ACA          -87.00                1.21                   +----------+------------+----------+-----------+--------------+ PCA          34.00                 1.46                   +----------+------------+----------+-----------+--------------+ Opthalmic    26.00                  2.3                   +----------+------------+----------+-----------+--------------+ ICA siphon   80.00                 1.81                   +----------+------------+----------+-----------+--------------+ Vertebral                                   not visualized +----------+------------+----------+-----------+--------------+  +------------+-------+--------------+             VM cm/s   Comment     +------------+-------+--------------+ Prox Basilar       not visualized +------------+-------+--------------+ Summary:  Elevated right middle cerebral artery mean flow velocities suggest moderate and worsening vasospasm. Mildly elevated left middle cerebral, and left carotid siphon mean flow velocities of unclear significance. *See table(s) above for TCD measurements and observations.  Diagnosing physician: Delia Heady MD Electronically signed by Delia Heady MD on 01/24/2020 at 8:15:01 AM.    Final      Cardiac Studies   ECHO:  01/15/2020 1. Normal LV systolic function; proximal septal thickening; grade 1  diastolic dysfunction; mildly dilated aortic root; scerotic aortic valve.  2. Left ventricular ejection fraction, by estimation, is 70 to 75%. The  left ventricle has hyperdynamic function. The  left ventricle has no  regional wall motion abnormalities. There is mild left ventricular  hypertrophy of the basal segment. Left  ventricular diastolic parameters are consistent with Grade I diastolic  dysfunction (impaired relaxation).  3. Right ventricular systolic function is normal. The right ventricular  size is normal.  4. The mitral valve is normal in structure. No evidence of mitral valve  regurgitation. No evidence of mitral stenosis.  5. The aortic valve is tricuspid. Aortic valve regurgitation is not  visualized. Mild to moderate aortic valve sclerosis/calcification is  present, without any evidence of aortic stenosis.  6. Aortic dilatation noted. There is mild dilatation of the aortic root  measuring 38 mm.  7. The inferior vena cava is normal in size with greater than 50%  respiratory variability, suggesting right atrial pressure of 3 mmHg.   Patient Profile     69 y.o. male w/ hx  BPH, colon polyp, deviated septum, DM2, hepatitis A, HSV-1 infection, HLD, HTN, migraine headache was admitted 04/26 w/ SAH, clinical vasospasm of basilar, MCA and right ICA s/p angioplasty. Because of the vasospasm, permissive HTN w/ SBP goal 180-220.  Assessment & Plan    Could convert to po amiodarone 400 mg daily if he is reliably eating. Keep Mg>2 and K>4. Suspect this will improve once he is weaned off pressors.  Chrystie NoseKenneth C. Deklin Bieler, MD, [facility] LLCFACC, FACP  [place]  [facility]  Medical Director of the [facility]  [facility] Diplomate of the American Board of Clinical Lipidology Attending Cardiologist  Direct Dial: [phone]  Fax: [phone]  Website:  [email or web address]  Sire Poet C Heavan Francom 10:11 AM 01/27/2020

## 2020-01-27 NOTE — Progress Notes (Signed)
Subjective: Patient reports no HA  Objective: Vital signs in last 24 hours: Temp:  [97.7 F (36.5 C)-99.8 F (37.7 C)] 99.8 F (37.7 C) (05/09 0413) Pulse Rate:  [78-106] 86 (05/09 0800) Resp:  [15-25] 21 (05/09 0800) BP: (151-216)/(58-182) 209/91 (05/09 0800) SpO2:  [89 %-97 %] 95 % (05/09 0800)  Intake/Output from previous day: 05/08 0701 - 05/09 0700 In: 6394.1 [I.V.:6208.4; IV Piggyback:185.7] Out: 6800 [Urine:6800] Intake/Output this shift: Total I/O In: 238.7 [I.V.:238.7] Out: -    Awake, alert but cognitively slowed.  Oriented to person, month, year. FC x 4. Full strength RUE/RLE.  4/5 LUE, 2/5 LLE.  Lab Results: Recent Labs    01/25/20 0544 01/26/20 0415  WBC 26.2* 23.1*  HGB 12.0* 11.9*  HCT 36.0* 35.3*  PLT 128* 169   BMET Recent Labs    01/26/20 1421 01/27/20 0600  NA 132* 133*  K 3.5 3.8  CL 95* 96*  CO2 28 27  GLUCOSE 125* 158*  BUN 8 11  CREATININE 0.57* 0.56*  CALCIUM 8.0* 8.2*    Studies/Results: VAS [place] TRANSCRANIAL DOPPLER  Result Date: 01/25/2020  Transcranial Doppler Indications: Subarachnoid hemorrhage. Limitations for diagnostic windows: Unable to insonate occipital window. Comparison Study: 01/23/2020 Performing Technologist: Chanda Busing RVT  Examination Guidelines: A complete evaluation includes B-mode imaging, spectral Doppler, color Doppler, and power Doppler as needed of all accessible portions of each vessel. Bilateral testing is considered an integral part of a complete examination. Limited examinations for reoccurring indications may be performed as noted.  +----------+-------------+----------+-----------+-------------------+ RIGHT TCD Right VM (cm)Depth (cm)Pulsatility      Comment       +----------+-------------+----------+-----------+-------------------+ MCA          107.00                 1.08                        +----------+-------------+----------+-----------+-------------------+ Term ICA      25.00                  2.21                        +----------+-------------+----------+-----------+-------------------+ PCA           39.00                 1.45                        +----------+-------------+----------+-----------+-------------------+ Opthalmic     24.00                 2.04                        +----------+-------------+----------+-----------+-------------------+ ICA siphon    66.00                 1.38                        +----------+-------------+----------+-----------+-------------------+ Vertebral                                   Unable to visualize +----------+-------------+----------+-----------+-------------------+  +----------+------------+----------+-----------+-------------------+ LEFT TCD  Left VM (cm)Depth (cm)Pulsatility      Comment       +----------+------------+----------+-----------+-------------------+ MCA  72.00                 1.47                        +----------+------------+----------+-----------+-------------------+ Term ICA     30.00                 1.38                        +----------+------------+----------+-----------+-------------------+ PCA          32.00                 1.48                        +----------+------------+----------+-----------+-------------------+ Opthalmic    27.00                 1.98                        +----------+------------+----------+-----------+-------------------+ ICA siphon   59.00                  1.9                        +----------+------------+----------+-----------+-------------------+ Vertebral                                  Unable to visualize +----------+------------+----------+-----------+-------------------+  +------------+-------+-------------------+             VM cm/s      Comment       +------------+-------+-------------------+ Prox Basilar       Unable to visualize +------------+-------+-------------------+ Dist Basilar       Unable  to visualize +------------+-------+-------------------+    Preliminary     Assessment/Plan: SAH with vasospasm, with improved neurologic exam - per Dr. Kathyrn Sheriff, planning for hypertensive therapy until 5/10 at which point BP goals will be liberalized. - continue Amiodarone gtt - electrolyte repletion - cont Nimotop   Vallarie Mare 01/27/2020, 9:59 AM

## 2020-01-28 ENCOUNTER — Inpatient Hospital Stay (HOSPITAL_COMMUNITY): Payer: BC Managed Care – PPO

## 2020-01-28 DIAGNOSIS — I67848 Other cerebrovascular vasospasm and vasoconstriction: Secondary | ICD-10-CM

## 2020-01-28 DIAGNOSIS — I609 Nontraumatic subarachnoid hemorrhage, unspecified: Secondary | ICD-10-CM

## 2020-01-28 LAB — CBC
HCT: 32.2 % — ABNORMAL LOW (ref 39.0–52.0)
Hemoglobin: 10.8 g/dL — ABNORMAL LOW (ref 13.0–17.0)
MCH: 29.3 pg (ref 26.0–34.0)
MCHC: 33.5 g/dL (ref 30.0–36.0)
MCV: 87.3 fL (ref 80.0–100.0)
Platelets: 262 10*3/uL (ref 150–400)
RBC: 3.69 MIL/uL — ABNORMAL LOW (ref 4.22–5.81)
RDW: 14.4 % (ref 11.5–15.5)
WBC: 19.5 10*3/uL — ABNORMAL HIGH (ref 4.0–10.5)
nRBC: 0 % (ref 0.0–0.2)

## 2020-01-28 LAB — BASIC METABOLIC PANEL
Anion gap: 10 (ref 5–15)
BUN: 11 mg/dL (ref 8–23)
CO2: 27 mmol/L (ref 22–32)
Calcium: 8.5 mg/dL — ABNORMAL LOW (ref 8.9–10.3)
Chloride: 97 mmol/L — ABNORMAL LOW (ref 98–111)
Creatinine, Ser: 0.64 mg/dL (ref 0.61–1.24)
GFR calc Af Amer: >60 mL/min (ref >60)
GFR calc non Af Amer: >60 mL/min (ref >60)
Glucose, Bld: 122 mg/dL — ABNORMAL HIGH (ref 70–99)
Potassium: 3.6 mmol/L (ref 3.5–5.1)
Sodium: 134 mmol/L — ABNORMAL LOW (ref 135–145)

## 2020-01-28 LAB — GLUCOSE, CAPILLARY
Glucose-Capillary: 116 mg/dL — ABNORMAL HIGH (ref 70–99)
Glucose-Capillary: 110 mg/dL — ABNORMAL HIGH (ref 70–99)
Glucose-Capillary: 107 mg/dL — ABNORMAL HIGH (ref 70–99)
Glucose-Capillary: 108 mg/dL — ABNORMAL HIGH (ref 70–99)
Glucose-Capillary: 103 mg/dL — ABNORMAL HIGH (ref 70–99)
Glucose-Capillary: 121 mg/dL — ABNORMAL HIGH (ref 70–99)

## 2020-01-28 IMAGING — DX DG CHEST 1V PORT
1 series · 1 of 1 positions shown · non-contrast
Comparison: [DATE]

CLINICAL DATA: Left arm swelling and leaking central venous line

EXAM:
PORTABLE CHEST 1 VIEW

[chest ap]
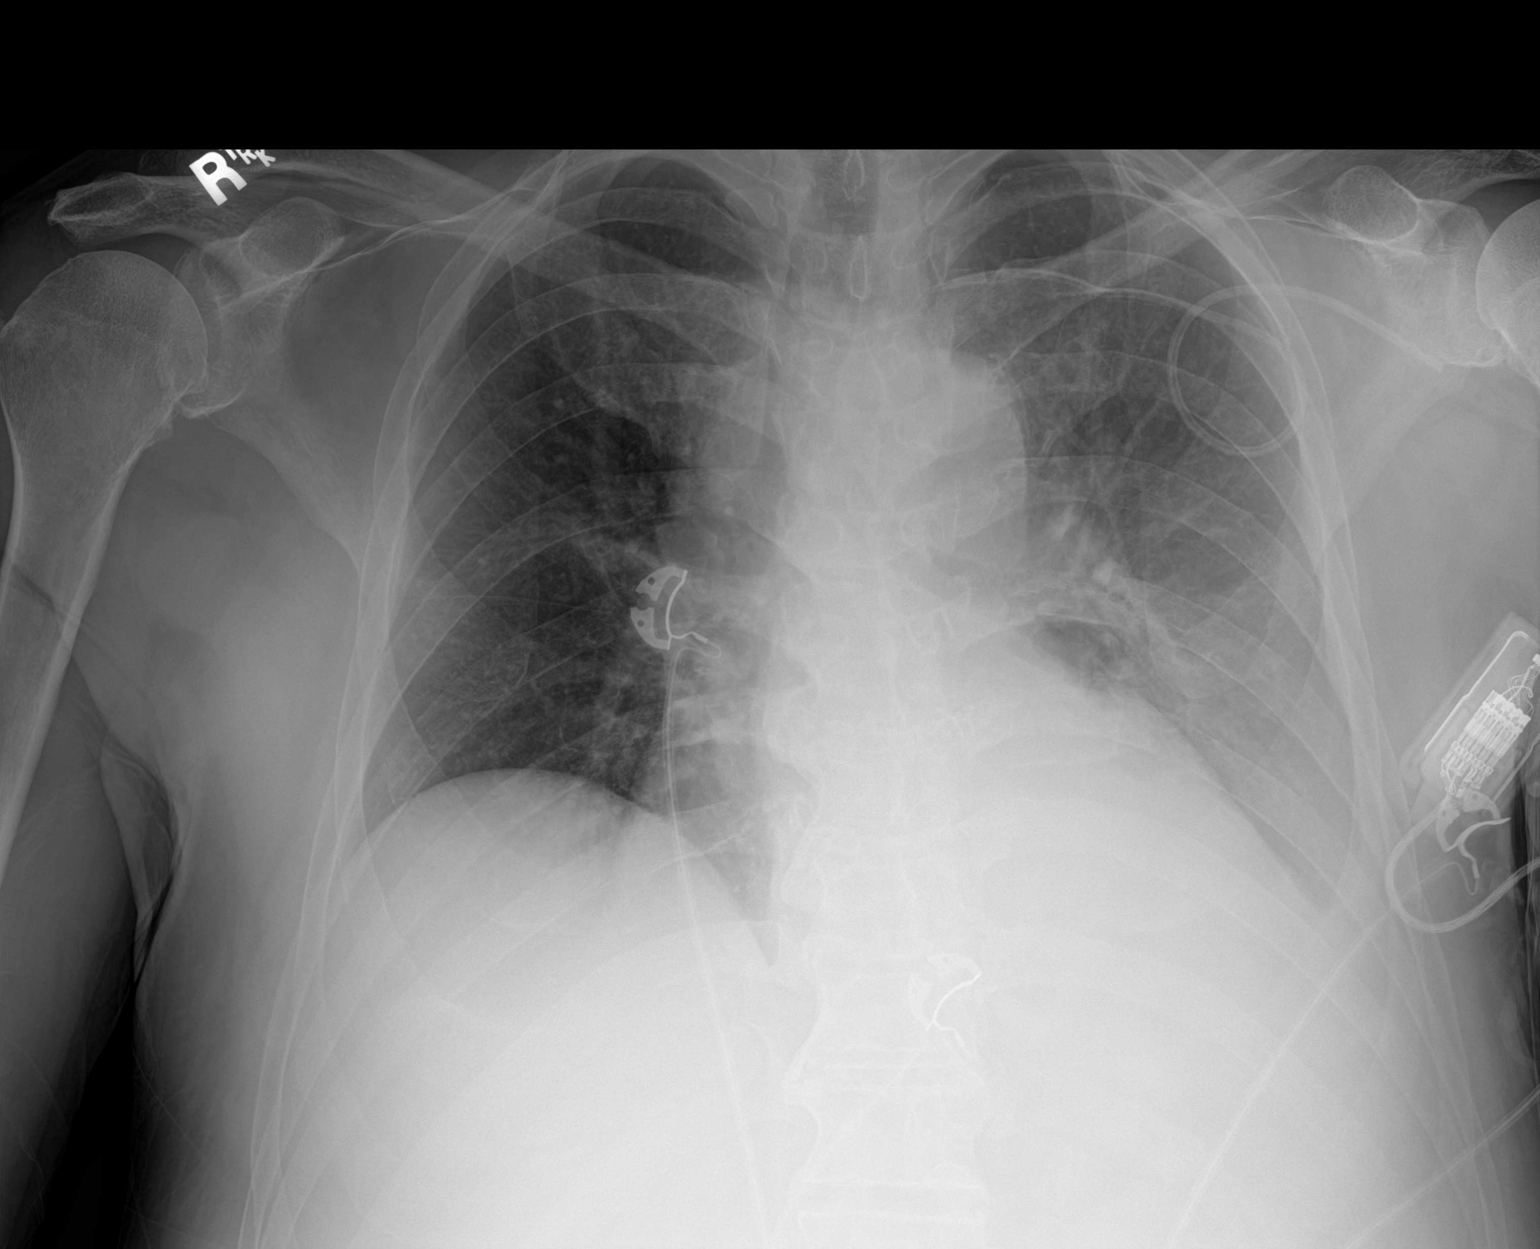

[1 of 1 positions shown; findings below may reference images not displayed]

FINDINGS: Cardiac shadow is stable. Small left-sided pleural effusion is noted
increased from the prior exam. No focal confluent infiltrate is
seen. The previously seen left subclavian central line has withdrawn
and now has a loop likely underneath the skin with the catheter tip
in the central left subclavian vein near its junction with the
internal jugular vein. No other focal abnormality is seen.
IMPRESSION: New left-sided pleural effusion when compare with the prior exam.

Left-sided central line has withdrawn significantly with looping
likely underneath the skin. Correlate with the clinical exam. This
may require removal and replacement at a separate site.

## 2020-01-28 MED ORDER — ENOXAPARIN SODIUM 40 MG/0.4ML ~~LOC~~ SOLN
40.0000 mg | SUBCUTANEOUS | Status: DC
  Administered 2020-01-29 – 2020-02-01 (×3): 40 mg via SUBCUTANEOUS
  Filled 2020-01-28 (×3): qty 0.4

## 2020-01-28 MED ORDER — POTASSIUM CHLORIDE CRYS ER 20 MEQ PO TBCR
40.0000 meq | EXTENDED_RELEASE_TABLET | Freq: Once | ORAL | Status: AC
  Administered 2020-01-28: 13:00:00 40 meq via ORAL
  Filled 2020-01-28: qty 2

## 2020-01-28 NOTE — Progress Notes (Signed)
PULMONARY / CRITICAL CARE MEDICINE   NAME:  Nicholas [name], MRN:  [id number], DOB:  12/18/1950, LOS: 14 ADMISSION DATE:  01/14/2020, CONSULTATION DATE:  01/14/20 REFERRING MD: ED Physician, CHIEF COMPLAINT:  SAH  BRIEF HISTORY:    69 yo male with severe SAH and vasospasm.  Course complicated by left-sided weakness  Both CTA and catheter angiography negative for aneurysm due to presence of vasospasm. Severe symptomatic 3 vessel vasospasm and has responded reasonably well to chemical/mechanical angioplasty and hyperdynamic treatment.   SIGNIFICANT PAST MEDICAL HISTORY   He  has a past medical history of BPH (benign prostatic hypertrophy) (12/20/2011), Colon polyp, Deviated septum, Diabetes mellitus without complication (HCC), Hepatitis A, HSV-1 (herpes simplex virus 1) infection, Hyperlipidemia, Hypertension, and Migraine headache.  SIGNIFICANT EVENTS:  4/30 > Diagnostic cerebral angiogram with balloon angioplasty 4/30 5/4 > Remains on two vasopressors for escalated map goal per neuro/neurosx 5/5 wide-complex tachycardia runs, worsening hypotension with Lopressor, started on amio   STUDIES:   Echo 4/27 1. Normal LV systolic function; proximal septal thickening; grade 1  diastolic dysfunction; mildly dilated aortic root; scerotic aortic valve.  2. Left ventricular ejection fraction, by estimation, is 70 to 75%. The  left ventricle has hyperdynamic function. The left ventricle has no  regional wall motion abnormalities. There is mild left ventricular  hypertrophy of the basal segment. Left  ventricular diastolic parameters are consistent with Grade I diastolic  dysfunction (impaired relaxation).  3. Right ventricular systolic function is normal. The right ventricular  size is normal.  4. The mitral valve is normal in structure. No evidence of mitral valve  regurgitation. No evidence of mitral stenosis.  5. The aortic valve is tricuspid. Aortic valve regurgitation is not  visualized. Mild  to moderate aortic valve sclerosis/calcification is  present, without any evidence of aortic stenosis.  6. Aortic dilatation noted. There is mild dilatation of the aortic root  measuring 38 mm.  7. The inferior vena cava is normal in size with greater than 50%  respiratory variability, suggesting right atrial pressure of 3 mmHg.   Transcranial Doppler 4/28 Elevated right middle cerebral and carotid siphon and left middle cerebral artery and carotid siphon mean flow velocities suggest mild vasospasm.  Slight improvement compared with prior TCD study 01/14/20   CT ANGIO HEAD W OR WO CONTRAST 01/18/2020  Overall decreased volume of subarachnoid hemorrhage since 01/15/2020.   New left subdural hygroma and subcentimeter rightward midline shift.   Resolution of hydrocephalus.   Further significantly diminished flow within the right ICA with no enhancement visualized at and beyond the cavernous segment.   Mild increased right M1 MCA narrowing and diminished distal right MCA branch filling.   New narrowing of the left A1 ACA with severe stenosis near the origin.   Persistent irregularity of the distal basilar artery with new superimposed severe stenosis.   IR PTA Vasospasm Initial IR PTA Vasospasm Add Diff IR ENDOVASC INTRACRANIAL INF OTHER THAN THROMBO ART INC DIAG ANGIO IR ENDOVASC INTRACRANIAL INF OTHER THAN THROMBO ART INC DIAG ANGIO EA ADD 01/18/2020  Severe vasospasm involving the supraclinoid segments of the internal carotid arteries bilaterally and the length of the basilar artery.   There is significant improvement in vessel caliber and distal territory perfusion after balloon angioplasty of the right internal carotid artery, right middle cerebral artery, and basilar artery.   TCD 5/1 Improvement in vasospasm overall but persistent elevated right middle  cerebral and left carotid siphon mean flow velocities suggestive of mild  vasospasm.  MRA/ MRI head 5/1  Multiple  small acute infarcts the right cerebral hemisphere primarily involving the anterior greater than posterior circulations.  Thin left larger than right subdural collections with stable mild rightward midline shift.  Residual subarachnoid hemorrhage likely similar to prior CT.  Suboptimal vascular evaluation due to intrinsic T1 hyperintensity of hemorrhage. Improved flow within the intracranial right ICA compared to 01/18/2020 CTA.   Appearance is now similar to 01/14/2020 CTA with persistent severe stenosis of the supraclinoid portion. Likely persistent left A1 ACA stenosis and distal basilar stenosis.  TCD 5/3 Mildly elevated right middlle crebral and bilateral carotid siphon mean  flow velocities indicate mild vasospasm. Normal mean flow velocities in  remaining identified vessels of anterior and posterior cerebral circulations.Globally elevated pulsatility indices indicate increase in intracranial pressure likely.   TCD 5/4  Mildly elevated right middlle crebral and bilateral carotid siphon mean  flow velocities indicate mild vasospasm. Normal mean flow velocities in  remaining identified vessels of anterior and posterior cerebral circulations.Globally elevated pulsatility indices indicate increase in intracranial pressure likely.   5/4 MRA head/ MRI brain > 1. Mildly increased Left Subdural Hematoma since 01/19/2020, now up to 9 mm in thickness. Smaller right side subdural hematoma has regressed, now trace. 2. Associated mildly increased rightward midline shift, now 7 mm. 3. Stable moderate volume basilar cistern predominant Subarachnoid Hemorrhage with no significant intraventricular hemorrhage and no ventriculomegaly. 4. Increased right hemisphere superior peri-Rolandic infarcts since 01/19/2020. Cytotoxic edema with no associated parenchymal hemorrhage or mass effect. 5. Otherwise stable multifocal right hemisphere ischemia, including at the right caudothalamic groove. 6. Intracranial  MRA is more motion degraded today, but flow within the large vessels appear stable since 01/19/2020. Largely obscured second order and distal branches today.    CULTURES:  COVID 4/26 > negative MRSA PCR 4/26 > negative  BCx2 4/26 >> neg Urine culture 4/26: neg Blood culture 5/2: NGTD  ANTIBIOTICS:  4/26 vanc x 1 4/26 cefepime x1  LINES/TUBES:  CVC triple lumen catheter (left subclavian) 01/18/20 A-line 4/30 >> 5/1 Foley urinary catheter  CONSULTANTS:  Neurology, neurosurgery  SUBJECTIVE:   Afebrile Systolic blood pressure within range on lower doses of Neo-Synephrine, And may switch to oral over the weekend  CONSTITUTIONAL: BP (!) 207/83 (BP Location: Left Arm)   Pulse 79   Temp 98.8 F (37.1 C) (Oral)   Resp 20   Ht 6' (1.829 m)   Wt 92.2 kg   SpO2 94%   BMI 27.57 kg/m   I/O last 3 completed shifts: In: 7402.1 [I.V.:7288.4; IV Piggyback:113.7] Out: 9450 [Urine:9450]  CVP:  [5 mmHg-47 mmHg] 9 mmHg    PHYSICAL EXAM: GEN: no acute distress HEENT: R eye ptosis but can open to command, trachea midline, MMM CV: RRR, ext warm PULM: Clear without wheezing or rhonci GI: Soft, nontender EXT: No edema NEURO: Moves right side briskly to command, left upper extremity stronger 4/5, did not move left lower extremity?  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 He is currently on [facility] treatment.  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  -S/P diagnostic cerebral angiogram with balloon angioplasty  - MRA/MRI 5/4 showed no significant worsening of b/l ICA vasospasm Left hemiplegia improving P: Continue management per neurology/neurosurgery  Seizure precautions AEDs in place by  neurology SBP goal is still 180 but expect neurosurgery to lower -hopefully vasopressin can be turned off and neosynephrine can be used as a single pressor going forward Ct Nimodipine    Low grade fevers -Likely neurogenic P: Follow leukocytosis  Wide-complex tachycardia Prolonged QT interval -EKG 5/6 with imporved QTc 436 -Holding fenofibrate per pharmacy recs given it can affect QTc, (triglycerides 124 on 5/1) P: Continue p.o. amio 400 Maintain electrolytes: replete K/Mg    Insulin dependent type 2 diabetes  -Home medications include Tresibas and Levemir  -Hemoglobin A1C 01/14/2020 P: SSI plus Levemir Goal CBG less than 180  Thrombocytopenia- improved.  HIT neg. -Change back from fondaparinux to Lovenox  SUMMARY OF TODAY'S PLAN:  Continued observation, vasospasm watch in the intensive care unit while  on continuous vasopressors.  Best Practice / Goals of Care / Disposition.   Diet: heart healthy/ carb modified Pain/Anxiety/Delirium protocol (if indicated): PRNs VAP protocol (if indicated): Pulmonary hygiene  DVT prophylaxis: SCD, fondaparinux GI prophylaxis:PPI Glucose control: see above Mobility: Bedrest Code Status: Full FAMILY DISCUSSIONS: Per primary DISPOSITION: continue Neuro ICU care  The patient is critically ill with multiple organ systems failure and requires high complexity decision making for assessment and support, frequent evaluation and titration of therapies, application of advanced monitoring technologies and extensive interpretation of multiple databases. Critical Care Time devoted to patient care services described in this note independent of APP/resident  time is 31 minutes.   Kara Mead MD. [facility] Flood. [facility]  If no response to pager , please call [phone]   01/28/2020

## 2020-01-28 NOTE — Progress Notes (Addendum)
Transcranial Doppler  Date POD PCO2 HCT BP  MCA ACA PCA OPHT SIPH VERT Basilar  4/27 MS     Right  Left   197  64   *  105   12  -17   27  23    150  29   -23  -31   -36      4/28,rs     Right  Left   134  131   -31  -92   38  -24   57  50   153  136   -18  -31   -42      5/1 GC     Right  Left   96  50   *  *   30  37   30  25   14   95   -60  -22   -63      5/3 MR      Right  Left   127  59   -14  *   36  *   47  34   36  103   -42  -18   -62      5/5      Right  Left                                       5/7     Right  Left                                       5/10     Right  Left   191  89   -48  -103   45  -43   36  30   84  67   *  *   *       MCA = Middle Cerebral Artery      OPHT = Opthalmic Artery     BASILAR = Basilar Artery   ACA = Anterior Cerebral Artery     SIPH = Carotid Siphon PCA = Posterior Cerebral Artery   VERT = Verterbral Artery                   Normal MCA = 62+\-12 ACA = 50+\-12 PCA = 42+\-23  *Unable to insonate 01/15/2020- Right [place] ratio=7.3, left [place] ratio=1.68 01/16/20 - Right [place]ard ratio = 3.3, left [place] ratio = 6.2   01/28/2020 12:24 PM

## 2020-01-28 NOTE — Progress Notes (Addendum)
Progress Note  Patient Name: Nicholas [name] Date of Encounter: 01/28/2020  Primary Cardiologist:  Chrystie Nose, MD  Subjective   No cardiac complaints   Inpatient Medications    Scheduled Meds: . amiodarone  400 mg Oral Daily  . bisacodyl  10 mg Oral Q0600  . Chlorhexidine Gluconate Cloth  6 each Topical Daily  . cholecalciferol  1,000 Units Oral Daily  . fondaparinux (ARIXTRA) injection  2.5 mg Subcutaneous Q0600  . insulin aspart  0-24 Units Subcutaneous Q4H  . insulin aspart  12 Units Subcutaneous TID WC  . insulin detemir  35 Units Subcutaneous QHS  . insulin detemir  40 Units Subcutaneous Daily  . levETIRAcetam  500 mg Oral BID  . magnesium hydroxide  30 mL Oral Daily  . midodrine  10 mg Oral TID WC  . niMODipine  30 mg Oral Q2H   Or  . niMODipine  30 mg Oral Q2H  . rosuvastatin  40 mg Oral Daily  . senna-docusate  1 tablet Oral BID  . sodium chloride flush  10-40 mL Intracatheter Q12H   Continuous Infusions: . sodium chloride 0 mL/hr at 01/14/20 1126  . 0.9 % NaCl with KCl 20 mEq / L 100 mL/hr at 01/28/20 0800  . norepinephrine (LEVOPHED) Adult infusion Stopped (01/27/20 1837)  . phenylephrine (NEO-SYNEPHRINE) Adult infusion 250 mcg/min (01/28/20 0800)  . vasopressin (PITRESSIN) infusion - *FOR SHOCK* 0.04 Units/min (01/28/20 0800)   PRN Meds: sodium chloride, acetaminophen **OR** acetaminophen (TYLENOL) oral liquid 160 mg/5 mL **OR** acetaminophen, bisacodyl, HYDROmorphone (DILAUDID) injection, methocarbamol, oxyCODONE-acetaminophen, simethicone, sodium chloride flush   Vital Signs    Vitals:   01/28/20 0715 01/28/20 0730 01/28/20 0745 01/28/20 0800  BP: (!) 196/85 (!) 203/87 (!) 206/90 (!) 207/83  Pulse: 88 89 88 79  Resp: 19 (!) 21 20 20   Temp:    98.8 F (37.1 C)  TempSrc:    Oral  SpO2: 96% 95% 95% 94%  Weight:      Height:        Intake/Output Summary (Last 24 hours) at 01/28/2020 0818 Last data filed at 01/28/2020 0800 Gross per 24 hour    Intake 4441.7 ml  Output 6350 ml  Net -1908.3 ml   Filed Weights   01/21/20 0500 01/22/20 0500 01/28/20 0332  Weight: 90.3 kg 96.5 kg 92.2 kg   Last Weight  Most recent update: 01/28/2020  3:33 AM   Weight  92.2 kg (203 lb 4.2 oz)           Weight change:    Telemetry    Sinus with PVC's - Personally Reviewed  ECG    N/A  Physical Exam   Affect appropriate Healthy:  appears stated age HEENT: normal Neck supple with no adenopathy JVP normal no bruits no thyromegaly Lungs clear with no wheezing and good diaphragmatic motion Heart:  S1/S2 no murmur, no rub, gallop or click PMI normal Abdomen: benighn, BS positve, no tenderness, no AAA no bruit.  No HSM or HJR Distal pulses intact with no bruits No edema Neuro non-focal Skin warm and dry No muscular weakness   Labs    Hematology Recent Labs  Lab 01/24/20 0550 01/25/20 0544 01/26/20 0415  WBC 24.3* 26.2* 23.1*  RBC 4.70 4.18* 4.08*  HGB 13.8 12.0* 11.9*  HCT 40.8 36.0* 35.3*  MCV 86.8 86.1 86.5  MCH 29.4 28.7 29.2  MCHC 33.8 33.3 33.7  RDW 13.6 13.7 14.0  PLT 125* 128* 169    Chemistry  Recent Labs  Lab 01/24/20 0550 01/24/20 1732 01/26/20 0415 01/26/20 1421 01/27/20 0600  NA 136   < > 133* 132* 133*  K 2.9*   < > 3.5 3.5 3.8  CL 100   < > 94* 95* 96*  CO2 28   < > 27 28 27   GLUCOSE 152*   < > 185* 125* 158*  BUN 15   < > 10 8 11   CREATININE 0.66   < > 0.67 0.57* 0.56*  CALCIUM 8.2*   < > 8.0* 8.0* 8.2*  ALBUMIN 2.6*  --   --   --   --   GFRNONAA >60   < > >60 >60 >60  GFRAA >60   < > >60 >60 >60  ANIONGAP 8   < > 12 9 10    < > = values in this interval not displayed.     High Sensitivity Troponin:  No results for input(s): TROPONINIHS in the last 720 hours.    BNPNo results for input(s): BNP, PROBNP in the last 168 hours.   Magnesium  Date Value Ref Range Status  01/27/2020 1.9 1.7 - 2.4 mg/dL Final    Comment:    Performed at [facility], [place] [address],  [place], [place] [address]     Radiology    VAS [place] TRANSCRANIAL DOPPLER  Result Date: 01/25/2020  Transcranial Doppler Indications: Subarachnoid hemorrhage. Limitations for diagnostic windows: Unable to insonate occipital window. Comparison Study: 01/23/2020 Performing Technologist: Oliver Hum RVT  Examination Guidelines: A complete evaluation includes B-mode imaging, spectral Doppler, color Doppler, and power Doppler as needed of all accessible portions of each vessel. Bilateral testing is considered an integral part of a complete examination. Limited examinations for reoccurring indications may be performed as noted.  +----------+-------------+----------+-----------+-------------------+ RIGHT TCD Right VM (cm)Depth (cm)Pulsatility      Comment       +----------+-------------+----------+-----------+-------------------+ MCA          107.00                 1.08                        +----------+-------------+----------+-----------+-------------------+ Term ICA      25.00                 2.21                        +----------+-------------+----------+-----------+-------------------+ PCA           39.00                 1.45                        +----------+-------------+----------+-----------+-------------------+ Opthalmic     24.00                 2.04                        +----------+-------------+----------+-----------+-------------------+ ICA siphon    66.00                 1.38                        +----------+-------------+----------+-----------+-------------------+ Vertebral  Unable to visualize +----------+-------------+----------+-----------+-------------------+  +----------+------------+----------+-----------+-------------------+ LEFT TCD  Left VM (cm)Depth (cm)Pulsatility      Comment       +----------+------------+----------+-----------+-------------------+ MCA          72.00                 1.47                         +----------+------------+----------+-----------+-------------------+ Term ICA     30.00                 1.38                        +----------+------------+----------+-----------+-------------------+ PCA          32.00                 1.48                        +----------+------------+----------+-----------+-------------------+ Opthalmic    27.00                 1.98                        +----------+------------+----------+-----------+-------------------+ ICA siphon   59.00                  1.9                        +----------+------------+----------+-----------+-------------------+ Vertebral                                  Unable to visualize +----------+------------+----------+-----------+-------------------+  +------------+-------+-------------------+             VM cm/s      Comment       +------------+-------+-------------------+ Prox Basilar       Unable to visualize +------------+-------+-------------------+ Dist Basilar       Unable to visualize +------------+-------+-------------------+    Preliminary      Cardiac Studies   ECHO:  01/15/2020 1. Normal LV systolic function; proximal septal thickening; grade 1  diastolic dysfunction; mildly dilated aortic root; scerotic aortic valve.  2. Left ventricular ejection fraction, by estimation, is 70 to 75%. The  left ventricle has hyperdynamic function. The left ventricle has no  regional wall motion abnormalities. There is mild left ventricular  hypertrophy of the basal segment. Left  ventricular diastolic parameters are consistent with Grade I diastolic  dysfunction (impaired relaxation).  3. Right ventricular systolic function is normal. The right ventricular  size is normal.  4. The mitral valve is normal in structure. No evidence of mitral valve  regurgitation. No evidence of mitral stenosis.  5. The aortic valve is tricuspid. Aortic valve regurgitation is not  visualized. Mild  to moderate aortic valve sclerosis/calcification is  present, without any evidence of aortic stenosis.  6. Aortic dilatation noted. There is mild dilatation of the aortic root  measuring 38 mm.  7. The inferior vena cava is normal in size with greater than 50%  respiratory variability, suggesting right atrial pressure of 3 mmHg.   Patient Profile     69 y.o. male w/ hx BPH, colon polyp, deviated septum, DM2, hepatitis A, HSV-1 infection, HLD, HTN, migraine headache was admitted 04/26 w/ SAH, clinical vasospasm of basilar, MCA and right  ICA s/p angioplasty. Because of the vasospasm, permissive HTN w/ SBP goal 180-220.  Assessment & Plan    1. Arrhythmia:  Maintaining NSR no ectopy this am On oral amiodarone no anticoagulation with SAH.  2. BP:  Weaning pressors per neuro Levo off Neo being weaned on nimodipine for spasm likely target systolic going forward 160-170 mmHg   Charlton Haws 8:18 AM 01/28/2020

## 2020-01-28 NOTE — Progress Notes (Signed)
Physical Therapy Treatment Patient Details Name: Nicholas [name] MRN: [id number] DOB: 1951/05/03 Today's Date: 01/28/2020    History of Present Illness 69 y.o. male admitted on 01/14/20 for HA and confusion found to have large volume SAH with bil ICA, MCA, BA vasospasm s/p angioplasty.  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 Pt also having runs of V-tach, so cardiology consulted.  Pt with significant PMH of DM, HTN, herpes simplex virus-1, hyperlipidemia, Hepatitis A.      PT Comments    Pt more alert and was able to participate in entire 30 min session today. Pt with improved command follow however continues to present with delayed processing and comprehension. Pt now able to initiate movement of L UE however not of L. Attempted to use stedy to stand pt however unable to get hips ups all the way to place the flaps down for patient to sit. Pt with significant L Lateral lean in sitting and standing requiring maxA to maintain midline. Pt completed passive ROM to L LE however remains unable to activiely participate. Acute PT to cont to follow. Continue to recommend CIR Upon d/c for maximal functional recovery.   Follow Up Recommendations  CIR     Equipment Recommendations  Wheelchair (measurements PT);Wheelchair cushion (measurements PT);Hospital bed    Recommendations for Other Services Rehab consult     Precautions / Restrictions Precautions Precautions: Fall Precaution Comments: Lt hemiparesis with Lt neglect  Restrictions Weight Bearing Restrictions: No    Mobility  Bed Mobility Overal bed mobility: Needs Assistance Bed Mobility: Supine to Sit;Sit to Supine     Supine to sit: Max assist;+2 for physical assistance Sit to supine: Max assist;+2 for physical assistance   General bed mobility comments: HOB elevated for supine to sit, pt unable to use L UE and LE to assist despite increased L UE movement, pt unable to  use functionally, maxA for LE management back into bed  Transfers Overall transfer level: Needs assistance Equipment used: Ambulation equipment used Transfers: Sit to/from [facility] Sit to Stand: Max assist;+2 physical assistance;Mod assist         General transfer comment: attempted to stand from higher level surface however unable to achieve full upright standing to clear hips enough to put the stedys flaps down for patient to sit. Pt then returned to sitting and the maxi sky was used to transfer pt from bed to recliner.  Ambulation/Gait                 Stairs             Wheelchair Mobility    Modified Rankin (Stroke Patients Only) Modified Rankin (Stroke Patients Only) Pre-Morbid Rankin Score: No symptoms Modified Rankin: Severe disability     Balance Overall balance assessment: Needs assistance Sitting-balance support: Feet supported;Bilateral upper extremity supported Sitting balance-Leahy Scale: Poor Sitting balance - Comments: pt with strong L Lateral lean requiring max verbal and tactile cues to achieve midline. Pt unable to maintain midline posture without mod/maxA. Pt would initiate self correction to command but unable to maintain Postural control: Left lateral lean Standing balance support: Bilateral upper extremity supported;During functional activity Standing balance-Leahy Scale: Zero Standing balance comment: significant L sided lean, dependent on maxAx2                            Cognition Arousal/Alertness: Awake/alert Behavior During Therapy: Flat affect Overall Cognitive  Status: Impaired/Different from baseline Area of Impairment: Following commands;Awareness;Problem solving                   Current Attention Level: Focused;Sustained   Following Commands: Follows one step commands inconsistently;Follows one step commands with increased time Safety/Judgement: Decreased awareness of safety(L sided  inattention) Awareness: Emergent Problem Solving: Slow processing;Decreased initiation;Difficulty sequencing;Requires verbal cues;Requires tactile cues General Comments: pt with strong L lateral lean and unable to process to self correct, pt requiring increased time to follow commands and for processing      Exercises      General Comments General comments (skin integrity, edema, etc.): VSS on RA      Pertinent Vitals/Pain Pain Assessment: Faces Faces Pain Scale: Hurts even more Pain Location: "It hurts everywhere", pt with pain in hamstrings with LAQ bilat, reports back pain, and "where there is pressure from the mattresss" Pain Descriptors / Indicators: Discomfort;Grimacing;Guarding Pain Intervention(s): Limited activity within patient's tolerance    Home Living                      Prior Function            PT Goals (current goals can now be found in the care plan section) Progress towards PT goals: Progressing toward goals    Frequency    Min 4X/week      PT Plan Current plan remains appropriate    Co-evaluation              AM-PAC PT "6 Clicks" Mobility   Outcome Measure  Help needed turning from your back to your side while in a flat bed without using bedrails?: A Lot Help needed moving from lying on your back to sitting on the side of a flat bed without using bedrails?: A Lot Help needed moving to and from a bed to a chair (including a wheelchair)?: Total Help needed standing up from a chair using your arms (e.g., wheelchair or bedside chair)?: Total Help needed to walk in hospital room?: Total Help needed climbing 3-5 steps with a railing? : Total 6 Click Score: 8    End of Session Equipment Utilized During Treatment: Gait belt Activity Tolerance: Patient tolerated treatment well Patient left: in chair;with call bell/phone within reach;with chair alarm set;with family/visitor present Nurse Communication: Mobility status;Need for lift  equipment PT Visit Diagnosis: Muscle weakness (generalized) (M62.81);Hemiplegia and hemiparesis Hemiplegia - Right/Left: Left Hemiplegia - dominant/non-dominant: Non-dominant Hemiplegia - caused by: Nontraumatic intracerebral hemorrhage     Time: 0917-0950 PT Time Calculation (min) (ACUTE ONLY): 33 min  Charges:  $Therapeutic Activity: 8-22 mins $Neuromuscular Re-education: 8-22 mins                     Lewis Shock, PT, DPT Acute Rehabilitation Services Pager #: [phone] Office #: [phone]     Iona Hansen 01/28/2020, 11:16 AM

## 2020-01-29 LAB — BASIC METABOLIC PANEL
Anion gap: 12 (ref 5–15)
BUN: 10 mg/dL (ref 8–23)
CO2: 28 mmol/L (ref 22–32)
Calcium: 8.7 mg/dL — ABNORMAL LOW (ref 8.9–10.3)
Chloride: 98 mmol/L (ref 98–111)
Creatinine, Ser: 0.64 mg/dL (ref 0.61–1.24)
GFR calc Af Amer: >60 mL/min (ref >60)
GFR calc non Af Amer: >60 mL/min (ref >60)
Glucose, Bld: 72 mg/dL (ref 70–99)
Potassium: 3.5 mmol/L (ref 3.5–5.1)
Sodium: 138 mmol/L (ref 135–145)

## 2020-01-29 LAB — GLUCOSE, CAPILLARY
Glucose-Capillary: 85 mg/dL (ref 70–99)
Glucose-Capillary: 85 mg/dL (ref 70–99)
Glucose-Capillary: 112 mg/dL — ABNORMAL HIGH (ref 70–99)
Glucose-Capillary: 179 mg/dL — ABNORMAL HIGH (ref 70–99)
Glucose-Capillary: 321 mg/dL — ABNORMAL HIGH (ref 70–99)
Glucose-Capillary: 94 mg/dL (ref 70–99)

## 2020-01-29 LAB — CBC
HCT: 33.1 % — ABNORMAL LOW (ref 39.0–52.0)
Hemoglobin: 10.9 g/dL — ABNORMAL LOW (ref 13.0–17.0)
MCH: 28.7 pg (ref 26.0–34.0)
MCHC: 32.9 g/dL (ref 30.0–36.0)
MCV: 87.1 fL (ref 80.0–100.0)
Platelets: 277 10*3/uL (ref 150–400)
RBC: 3.8 MIL/uL — ABNORMAL LOW (ref 4.22–5.81)
RDW: 14.4 % (ref 11.5–15.5)
WBC: 14.3 10*3/uL — ABNORMAL HIGH (ref 4.0–10.5)
nRBC: 0 % (ref 0.0–0.2)

## 2020-01-29 LAB — MAGNESIUM: Magnesium: 1.7 mg/dL (ref 1.7–2.4)

## 2020-01-29 LAB — PHOSPHORUS: Phosphorus: 4 mg/dL (ref 2.5–4.6)

## 2020-01-29 MED ORDER — MAGNESIUM SULFATE 4 GM/100ML IV SOLN
4.0000 g | Freq: Once | INTRAVENOUS | Status: AC
  Administered 2020-01-29: 4 g via INTRAVENOUS
  Filled 2020-01-29: qty 100

## 2020-01-29 MED ORDER — POTASSIUM CHLORIDE CRYS ER 20 MEQ PO TBCR
40.0000 meq | EXTENDED_RELEASE_TABLET | Freq: Four times a day (QID) | ORAL | Status: AC
  Administered 2020-01-29 (×2): 40 meq via ORAL
  Filled 2020-01-29 (×2): qty 2

## 2020-01-29 MED ORDER — MIDODRINE HCL 5 MG PO TABS
5.0000 mg | ORAL_TABLET | Freq: Three times a day (TID) | ORAL | Status: DC
  Administered 2020-01-29 – 2020-01-30 (×3): 5 mg via ORAL
  Filled 2020-01-29 (×2): qty 1

## 2020-01-29 NOTE — Progress Notes (Signed)
  NEUROSURGERY PROGRESS NOTE   Pt seen and examined. No issues overnight.   EXAM: Temp:  [98.1 F (36.7 C)-100.3 F (37.9 C)] 100.3 F (37.9 C) (05/11 0800) Pulse Rate:  [73-111] 90 (05/11 0900) Resp:  [15-25] 22 (05/11 0900) BP: (151-213)/(61-99) 171/62 (05/11 0900) SpO2:  [88 %-97 %] 95 % (05/11 0900) Intake/Output      05/10 0701 - 05/11 0700 05/11 0701 - 05/12 0700   P.O. 600    I.V. (mL/kg) 2363.5 (25.6) 150 (1.6)   IV Piggyback     Total Intake(mL/kg) 2963.5 (32.1) 150 (1.6)   Urine (mL/kg/hr) 8275 (3.7) 600 (1.9)   Stool     Total Output 8275 600   Net -5311.5 -450        Urine Occurrence 2 x     Awake, alert Confused Antigravity LUE, 1-2/5 LLE Good strength RUE/RLE  LABS: Lab Results  Component Value Date   CREATININE 0.64 01/29/2020   BUN 10 01/29/2020   NA 138 01/29/2020   K 3.5 01/29/2020   CL 98 01/29/2020   CO2 28 01/29/2020   Lab Results  Component Value Date   WBC 14.3 (H) 01/29/2020   HGB 10.9 (L) 01/29/2020   HCT 33.1 (L) 01/29/2020   MCV 87.1 01/29/2020   PLT 277 01/29/2020    IMPRESSION: - 69 y.o. male SAH d# 14, neurologically stable left hemiparesis related to severe symptomatic vasospasm  PLAN: - Will check TCD today, then likely titrate pressors off if exam remains stable - Cont Nimotop - Cont therapy

## 2020-01-29 NOTE — Progress Notes (Signed)
Physical Therapy Treatment Patient Details Name: Nicholas [name] MRN: [id number] DOB: Mar 30, 1951 Today's Date: 01/29/2020    History of Present Illness 69 y.o. male admitted on 01/14/20 for HA and confusion found to have large volume SAH with bil ICA, MCA, BA vasospasm s/p angioplasty.  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 Pt also having runs of V-tach, so cardiology consulted.  Pt with significant PMH of DM, HTN, herpes simplex virus-1, hyperlipidemia, Hepatitis A.      PT Comments    Pt seen in conjunction with OT today for optimal neuromuscular re-education at EOB and in standing. Pt continues with severe L sided neglect and L lateral lean requiring max tactile cues at trunk and head to achieve midline position and maintain it. Used stedy today and completed 2 standing episodes for approximately 1 min each. Continues to require max support and tactile cues at L UE and L knee and at trunk. Educated wife on how to promote patient to look towards the Left. Acute PT to cont to follow.    Follow Up Recommendations  CIR     Equipment Recommendations  Wheelchair (measurements PT);Wheelchair cushion (measurements PT);Hospital bed    Recommendations for Other Services Rehab consult     Precautions / Restrictions Precautions Precautions: Fall Precaution Comments: Lt hemiparesis with Lt neglect  Restrictions Weight Bearing Restrictions: No    Mobility  Bed Mobility Overal bed mobility: Needs Assistance Bed Mobility: Supine to Sit     Supine to sit: Max assist;+2 for physical assistance     General bed mobility comments: tactile cues over L UE to assist in rolling and transfer, pt unable to initiate movement of L LE at all. Pt maxA for trunk elevation and management of L LE  Transfers Overall transfer level: Needs assistance Equipment used: Ambulation equipment used Transfers: Sit to/from Stand Sit to Stand: Max  assist;+2 physical assistance;Mod assist         General transfer comment: completed 2 sit to stand episodes, L knee blocked and assist at posterior hips to promote trunk extension/upright position and support at anterior shoulders to also promote upright posture, pt tolerated about 1 minute each time, stedy used to transfer pt to recliner  Ambulation/Gait             General Gait Details: pt unable to ambulate at this time   Stairs             Wheelchair Mobility    Modified Rankin (Stroke Patients Only) Modified Rankin (Stroke Patients Only) Pre-Morbid Rankin Score: No symptoms Modified Rankin: Severe disability     Balance Overall balance assessment: Needs assistance Sitting-balance support: Feet supported;Bilateral upper extremity supported Sitting balance-Leahy Scale: Poor Sitting balance - Comments: pt with strong L lateral lean, OT provided truncal cues and support while PT assist with cervical ROM to the left and maintaining head in extension  Postural control: Left lateral lean Standing balance support: Bilateral upper extremity supported;During functional activity Standing balance-Leahy Scale: Zero Standing balance comment: dependent on stedy and physical assist                            Cognition Arousal/Alertness: Awake/alert Behavior During Therapy: Flat affect Overall Cognitive Status: Impaired/Different from baseline Area of Impairment: Following commands;Awareness;Problem solving                   Current Attention Level: Sustained  Following Commands: Follows one step commands with increased time;Follows multi-step commands inconsistently Safety/Judgement: Decreased awareness of safety;Decreased awareness of deficits(severe L sided neglect) Awareness: Emergent Problem Solving: Slow processing;Decreased initiation;Difficulty sequencing;Requires verbal cues;Requires tactile cues General Comments: pt with severe L sided  neglect despite max verbal and tactile cues. Pt with decreased comprehension of L sided neglect and how to compensate for that.       Exercises      General Comments General comments (skin integrity, edema, etc.): VSS      Pertinent Vitals/Pain Pain Assessment: Faces Faces Pain Scale: Hurts even more Pain Location: pt with hamstring pain with bilat LAQs Pain Descriptors / Indicators: Discomfort;Grimacing;Guarding Pain Intervention(s): Limited activity within patient's tolerance    Home Living                      Prior Function            PT Goals (current goals can now be found in the care plan section) Progress towards PT goals: Progressing toward goals    Frequency    Min 4X/week      PT Plan Current plan remains appropriate    Co-evaluation PT/OT/SLP Co-Evaluation/Treatment: Yes Reason for Co-Treatment: Complexity of the patient's impairments (multi-system involvement) PT goals addressed during session: Mobility/safety with mobility        AM-PAC PT "6 Clicks" Mobility   Outcome Measure  Help needed turning from your back to your side while in a flat bed without using bedrails?: A Lot Help needed moving from lying on your back to sitting on the side of a flat bed without using bedrails?: A Lot Help needed moving to and from a bed to a chair (including a wheelchair)?: Total Help needed standing up from a chair using your arms (e.g., wheelchair or bedside chair)?: Total Help needed to walk in hospital room?: Total Help needed climbing 3-5 steps with a railing? : Total 6 Click Score: 8    End of Session Equipment Utilized During Treatment: Gait belt Activity Tolerance: Patient tolerated treatment well Patient left: in chair;with call bell/phone within reach;with nursing/sitter in room;with family/visitor present Nurse Communication: Mobility status;Need for lift equipment PT Visit Diagnosis: Muscle weakness (generalized) (M62.81);Hemiplegia and  hemiparesis Hemiplegia - Right/Left: Left Hemiplegia - dominant/non-dominant: Non-dominant Hemiplegia - caused by: Nontraumatic intracerebral hemorrhage     Time: 1121-1214 PT Time Calculation (min) (ACUTE ONLY): 53 min  Charges:  $Therapeutic Activity: 8-22 mins $Neuromuscular Re-education: 8-22 mins                     Kittie Plater, PT, DPT Acute Rehabilitation Services Pager #: [phone] Office #: [phone]    Berline Lopes 01/29/2020, 1:51 PM

## 2020-01-29 NOTE — Progress Notes (Signed)
Occupational Therapy Treatment Note  Pt making progress toward goals. Requires Max A +2 with functional mobility however midline postural control improving EOB with facilitation. Trunk control impacting by poor sustained attention and L neglect. Pt able to engage in LE bathing and dressing, spontaneously using LUE. Wife engaged in session and educated on L neglect an how to work with her husband to increase attention to L and increase functional use of L hand. Wife able to observe how pt initiates use of LUE when task requires use of B hands and how to set up the bedside table to facilitate attention to L. Pt will benefit from extensive rehab at CIR to maximize functional level of independence to facilitate safe DC home with 24/7 assistance.     01/29/20 1400  OT Visit Information  Last OT Received On 01/29/20  Assistance Needed +2  PT/OT/SLP Co-Evaluation/Treatment Yes  Reason for Co-Treatment Complexity of the patient's impairments (multi-system involvement);For patient/therapist safety;To address functional/ADL transfers  OT goals addressed during session ADL's and self-care;Strengthening/ROM  History of Present Illness 69 y.o. male admitted on 01/14/20 for HA and confusion found to have large volume SAH with bil ICA, MCA, BA vasospasm s/p angioplasty.  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 Pt also having runs of V-tach, so cardiology consulted.  Pt with significant PMH of DM, HTN, herpes simplex virus-1, hyperlipidemia, Hepatitis A.    Precautions  Precautions Fall  Precaution Comments Lt hemiparesis with Lt neglect   Pain Assessment  Pain Assessment Faces  Faces Pain Scale 6  Pain Location pt with hamstring pain with bilat LAQs  Pain Descriptors / Indicators Discomfort;Grimacing;Guarding  Pain Intervention(s) Patient requesting pain meds-RN notified  Cognition  Arousal/Alertness Awake/alert  Behavior During Therapy  Flat affect  Overall Cognitive Status Impaired/Different from baseline  Area of Impairment Following commands;Awareness;Problem solving;Attention;Safety/judgement  Current Attention Level Sustained  Memory Decreased short-term memory  Following Commands Follows one step commands with increased time  Safety/Judgement Decreased awareness of safety;Decreased awareness of deficits (severe L sided neglect)  Awareness Emergent  Problem Solving Slow processing;Decreased initiation;Difficulty sequencing;Requires verbal cues;Requires tactile cues  General Comments Pt tangential at times, talking about mechanisms /processes. Pt asked about the "kitten that ran across the room". states heis in his hotel room, then says he is at [facility]; poor attention. Attentional deficits affecting postural control  Upper Extremity Assessment  Upper Extremity Assessment LUE deficits/detail  Lower Extremity Assessment  Lower Extremity Assessment Defer to PT evaluation  ADL  Overall ADL's  Needs assistance/impaired  Eating/Feeding Set up;Supervision/ safety;Sitting  Grooming Set up;Supervision/safety;Cueing for sequencing;Sitting  Grooming Details (indicate cue type and reason) cues to maintain attention to task  Lower Body Bathing Moderate assistance;Sit to/from stand;Sitting/lateral leans  Lower Body Bathing Details (indicate cue type and reason) Pt initiating useof LUE during functional tasks; Able to cross RLE over left to bath. Facilitation for forward lean/anterior weight shift to bath LLE; lost attention and did not finish task  Upper Body Dressing  Maximal assistance;Bed level  Lower Body Dressing Maximal assistance;Sit to/from stand  Lower Body Dressing Details (indicate cue type and reason) Able to reach foot to assist wtih pulling up socks  Toileting- Clothing Manipulation and Hygiene Total assistance  Toileting - Clothing Manipulation Details (indicate cue type and reason) Pt aware of urine collection  bag leaking onto floor during session  Functional mobility during ADLs Maximal assistance;+2 for physical assistance  General ADL Comments Pt turned so that  wife was on L at end of session. Educated pt/wife and on need to place meaningful items on L to increase attention to L; Educated wife on L neglect - wife asking about prognosis  Bed Mobility  Overal bed mobility Needs Assistance  Bed Mobility Supine to Sit  Supine to sit Max assist;+2 for physical assistance  General bed mobility comments tactile cues over L UE to assist in rolling and transfer, pt unable to initiate movement of L LE at all. Pt maxA for trunk elevation and management of L LE; pt assisting iwht UE to push upright  Balance  Overall balance assessment Needs assistance  Sitting-balance support Feet supported;Bilateral upper extremity supported  Sitting balance-Leahy Scale Poor  Sitting balance - Comments facilitation under L axilla and used tactile input to lower bac/pelvis to weight shift R/L. Pt leaning toward L but ablet o move back into midleine with VC. Unable to maintain at midlien during to inattention adn weakness. Good pability to push with LUE.  Postural control Left lateral lean  Standing balance support Bilateral upper extremity supported;During functional activity  Standing balance-Leahy Scale Zero  Standing balance comment dependent on stedy and physical assist  Restrictions  Weight Bearing Restrictions No  Vision- Assessment  Vision Assessment? Vision impaired- to be further tested in functional context  Additional Comments Keepe R eye closed at times. Staets "It's a habit". appears to have L field cut. Poor visual attention. Will track and attend to L however has R gaze preference  Transfers  Overall transfer level Needs assistance  Equipment used Ambulation equipment used  Transfer via Psychologist, forensic  Transfers Sit to/from Stand  Sit to Stand Max assist;+2 physical assistance;Mod assist  General  transfer comment completed 2 sit to stand episodes, L knee blocked and assist at posterior hips to promote trunk extension/upright position and support at anterior shoulders to also promote upright posture, pt tolerated about 1 minute each time, stedy used to transfer pt to recliner  General Comments  General comments (skin integrity, edema, etc.) VSS  OT - End of Session  Equipment Utilized During Treatment Gait belt  Activity Tolerance Patient tolerated treatment well  Patient left in chair;with call bell/phone within reach;with chair alarm set;with family/visitor present  Nurse Communication Mobility status  OT Assessment/Plan  OT Plan Discharge plan remains appropriate  OT Visit Diagnosis Hemiplegia and hemiparesis;Cognitive communication deficit (R41.841);Other abnormalities of gait and mobility (R26.89);Muscle weakness (generalized) (M62.81);Other symptoms and signs involving cognitive function;Pain  Symptoms and signs involving cognitive functions Cerebral infarction;Nontraumatic intracerebral hemorrhage  Hemiplegia - Right/Left Left  Hemiplegia - dominant/non-dominant Non-Dominant  Hemiplegia - caused by Cerebral infarction;Nontraumatic intracerebral hemorrhage  Pain - part of body  (back)  OT Frequency (ACUTE ONLY) Min 2X/week  Recommendations for Other Services Rehab consult  Follow Up Recommendations CIR;Supervision/Assistance - 24 hour  OT Equipment Other (comment) (TBA)  AM-PAC OT "6 Clicks" Daily Activity Outcome Measure (Version 2)  Help from another person eating meals? 3  Help from another person taking care of personal grooming? 3  Help from another person toileting, which includes using toliet, bedpan, or urinal? 2  Help from another person bathing (including washing, rinsing, drying)? 2  Help from another person to put on and taking off regular upper body clothing? 2  Help from another person to put on and taking off regular lower body clothing? 2  6 Click Score 14   OT Goal Progression  Progress towards OT goals Progressing toward goals  Acute Rehab OT Goals  Patient Stated Goal to get up on his feet  OT Goal Formulation With patient/family  Time For Goal Achievement 02/05/20  Potential to Achieve Goals Good  ADL Goals  Pt Will Perform Grooming with min assist;sitting  Pt Will Perform Lower Body Bathing with mod assist;sit to/from stand  Pt Will Transfer to Toilet with mod assist;stand pivot transfer;bedside commode  Pt Will Perform Toileting - Clothing Manipulation and hygiene with max assist;sit to/from stand  Additional ADL Goal #1 Pt will identify 2/4 targets in L visual field to increase attention for ADL tasks  OT Time Calculation  OT Start Time (ACUTE ONLY) 1121  OT Stop Time (ACUTE ONLY) 1222  OT Time Calculation (min) 61 min  OT General Charges  $OT Visit 1 Visit  OT Treatments  $Self Care/Home Management  8-22 mins  $Neuromuscular Re-education 8-22 mins  Luisa Dago, OT/L   Acute OT Clinical Specialist Acute Rehabilitation Services Pager [phone] Office [phone]

## 2020-01-29 NOTE — Progress Notes (Signed)
  NEUROSURGERY PROGRESS NOTE   Pt seen and examined. No issues overnight. No complaint this am.  EXAM: Temp:  [98.1 F (36.7 C)-100.3 F (37.9 C)] 100.3 F (37.9 C) (05/11 0800) Pulse Rate:  [73-111] 90 (05/11 0900) Resp:  [15-25] 22 (05/11 0900) BP: (151-213)/(61-99) 171/62 (05/11 0900) SpO2:  [88 %-97 %] 95 % (05/11 0900) Intake/Output      05/10 0701 - 05/11 0700 05/11 0701 - 05/12 0700   P.O. 600    I.V. (mL/kg) 2363.5 (25.6) 150 (1.6)   IV Piggyback     Total Intake(mL/kg) 2963.5 (32.1) 150 (1.6)   Urine (mL/kg/hr) 8275 (3.7) 600 (2)   Stool     Total Output 8275 600   Net -5311.5 -450        Urine Occurrence 2 x     Awake, alert Somewhat confused 4-/5 LUE, 1-2/5 LLE Good strength on right  LABS: Lab Results  Component Value Date   CREATININE 0.64 01/29/2020   BUN 10 01/29/2020   NA 138 01/29/2020   K 3.5 01/29/2020   CL 98 01/29/2020   CO2 28 01/29/2020   Lab Results  Component Value Date   WBC 14.3 (H) 01/29/2020   HGB 10.9 (L) 01/29/2020   HCT 33.1 (L) 01/29/2020   MCV 87.1 01/29/2020   PLT 277 01/29/2020    IMPRESSION: - 69 y.o. male SAH d# 15, neurologically stable left hemiparesis off pressor support and HHH treatment for ~12 hours.  PLAN: - Will keep pressors off, monitor neurologic exam. Allow BP to autoregulate - Will plan on repeating angiogram in the next day or two. - Assuming negative angiogram, can then look at d/c planning/CIR - Cont Nimotop - Cont Keppra

## 2020-01-29 NOTE — Progress Notes (Addendum)
PULMONARY / CRITICAL CARE MEDICINE   NAME:  LENORRIS KARGER, MRN:  [id number], DOB:  [birth year], LOS: 81 ADMISSION DATE:  01/14/2020, CONSULTATION DATE:  01/14/20 REFERRING MD: ED Physician, CHIEF COMPLAINT:  SAH  BRIEF HISTORY:    69 yo male with severe SAH and vasospasm.  Course complicated by left-sided weakness  Both CTA and catheter angiography negative for aneurysm due to presence of vasospasm. Severe symptomatic 3 vessel vasospasm and has responded reasonably well to chemical/mechanical angioplasty and hyperdynamic treatment.   SIGNIFICANT PAST MEDICAL HISTORY   He  has a past medical history of BPH (benign prostatic hypertrophy) (12/20/2011), Colon polyp, Deviated septum, Diabetes mellitus without complication ([place]), Hepatitis A, HSV-1 (herpes simplex virus 1) infection, Hyperlipidemia, Hypertension, and Migraine headache.  SIGNIFICANT EVENTS:  4/30 > Diagnostic cerebral angiogram with balloon angioplasty 4/30 5/4 > Remains on two vasopressors for escalated map goal per neuro/neurosx 5/5 wide-complex tachycardia runs, worsening hypotension with Lopressor, started on amio   STUDIES:   Echo 4/27 1. Normal LV systolic function; proximal septal thickening; grade 1  diastolic dysfunction; mildly dilated aortic root; scerotic aortic valve.  2. Left ventricular ejection fraction, by estimation, is 70 to 75%. The  left ventricle has hyperdynamic function. The left ventricle has no  regional wall motion abnormalities. There is mild left ventricular  hypertrophy of the basal segment. Left  ventricular diastolic parameters are consistent with Grade I diastolic  dysfunction (impaired relaxation).  3. Right ventricular systolic function is normal. The right ventricular  size is normal.  4. The mitral valve is normal in structure. No evidence of mitral valve  regurgitation. No evidence of mitral stenosis.  5. The aortic valve is tricuspid. Aortic valve regurgitation is not  visualized. Mild  to moderate aortic valve sclerosis/calcification is  present, without any evidence of aortic stenosis.  6. Aortic dilatation noted. There is mild dilatation of the aortic root  measuring 38 mm.  7. The inferior vena cava is normal in size with greater than 50%  respiratory variability, suggesting right atrial pressure of 3 mmHg.   Transcranial Doppler 4/28 Elevated right middle cerebral and carotid siphon and left middle cerebral artery and carotid siphon mean flow velocities suggest mild vasospasm.  Slight improvement compared with prior TCD study 01/14/20   CT ANGIO HEAD W OR WO CONTRAST 01/18/2020  Overall decreased volume of subarachnoid hemorrhage since 01/15/2020.   New left subdural hygroma and subcentimeter rightward midline shift.   Resolution of hydrocephalus.   Further significantly diminished flow within the right ICA with no enhancement visualized at and beyond the cavernous segment.   Mild increased right M1 MCA narrowing and diminished distal right MCA branch filling.   New narrowing of the left A1 ACA with severe stenosis near the origin.   Persistent irregularity of the distal basilar artery with new superimposed severe stenosis.   IR PTA Vasospasm Initial IR PTA Vasospasm Add Diff IR ENDOVASC INTRACRANIAL INF OTHER THAN THROMBO ART INC DIAG ANGIO IR ENDOVASC INTRACRANIAL INF OTHER THAN THROMBO ART INC DIAG ANGIO EA ADD 01/18/2020  Severe vasospasm involving the supraclinoid segments of the internal carotid arteries bilaterally and the length of the basilar artery.   There is significant improvement in vessel caliber and distal territory perfusion after balloon angioplasty of the right internal carotid artery, right middle cerebral artery, and basilar artery.   TCD 5/1 Improvement in vasospasm overall but persistent elevated right middle  cerebral and left carotid siphon mean flow velocities suggestive of mild  vasospasm.  MRA/ MRI head 5/1  Multiple  small acute infarcts the right cerebral hemisphere primarily involving the anterior greater than posterior circulations.  Thin left larger than right subdural collections with stable mild rightward midline shift.  Residual subarachnoid hemorrhage likely similar to prior CT.  Suboptimal vascular evaluation due to intrinsic T1 hyperintensity of hemorrhage. Improved flow within the intracranial right ICA compared to 01/18/2020 CTA.   Appearance is now similar to 01/14/2020 CTA with persistent severe stenosis of the supraclinoid portion. Likely persistent left A1 ACA stenosis and distal basilar stenosis.  TCD 5/3 Mildly elevated right middlle crebral and bilateral carotid siphon mean  flow velocities indicate mild vasospasm. Normal mean flow velocities in  remaining identified vessels of anterior and posterior cerebral circulations.Globally elevated pulsatility indices indicate increase in intracranial pressure likely.   TCD 5/4  Mildly elevated right middlle crebral and bilateral carotid siphon mean  flow velocities indicate mild vasospasm. Normal mean flow velocities in  remaining identified vessels of anterior and posterior cerebral circulations.Globally elevated pulsatility indices indicate increase in intracranial pressure likely.   5/4 MRA head/ MRI brain > 1. Mildly increased Left Subdural Hematoma since 01/19/2020, now up to 9 mm in thickness. Smaller right side subdural hematoma has regressed, now trace. 2. Associated mildly increased rightward midline shift, now 7 mm. 3. Stable moderate volume basilar cistern predominant Subarachnoid Hemorrhage with no significant intraventricular hemorrhage and no ventriculomegaly. 4. Increased right hemisphere superior peri-Rolandic infarcts since 01/19/2020. Cytotoxic edema with no associated parenchymal hemorrhage or mass effect. 5. Otherwise stable multifocal right hemisphere ischemia, including at the right caudothalamic groove. 6. Intracranial  MRA is more motion degraded today, but flow within the large vessels appear stable since 01/19/2020. Largely obscured second order and distal branches today.    CULTURES:  COVID 4/26 > negative MRSA PCR 4/26 > negative  BCx2 4/26 >> neg Urine culture 4/26: neg Blood culture 5/2: NGTD  ANTIBIOTICS:  4/26 vanc x 1 4/26 cefepime x1  LINES/TUBES:  CVC triple lumen catheter (left subclavian) 01/18/20 A-line 4/30 >> 5/1 Foley urinary catheter  CONSULTANTS:  Neurology, neurosurgery. PCCM  SUBJECTIVE:   Afebrile Systolic blood pressure within range off neo and vasopressin BP is 185 systolic, need SBP goal  set per neuro L sided weakness Complaining of back, leg pain No labs this am>> ordered  CONSTITUTIONAL: BP (!) 195/88 (BP Location: Left Arm)   Pulse 94   Temp 98.8 F (37.1 C) (Axillary)   Resp (!) 21   Ht 6' (1.829 m)   Wt 92.2 kg   SpO2 96%   BMI 27.57 kg/m   I/O last 3 completed shifts: In: 4958.6 [P.O.:600; I.V.:4358.6] Out: 37169 [Urine:11425]  CVP:  [0 mmHg-89 mmHg] 13 mmHg    PHYSICAL EXAM: GEN: no acute distress, awake, following commands  HEENT: R eye ptosis but can open to command, trachea midline, MMM, No LAD CV: S1, S2, RRR, No RMG, ext warm PULM: Bilateral chest excursion, Clear without wheezing or rhonci GI: Soft, non tender, ND, BS + EXT: No edema, no obvious deformities NEURO: Moves right side briskly to command, left upper extremity stronger 4/5, did not move left lower extremity?  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 He  is currently on [facility] treatment.  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   -S/P diagnostic cerebral angiogram with balloon angioplasty  - MRA/MRI 5/4 showed no significant worsening of b/l ICA vasospasm Left hemiplegia improving Off all pressors Need SBP goal  per neuro now off pressors and BP > 180 P: Continue management per neurology/neurosurgery Seizure precautions AEDs in place by neurology SBP goal is still 180 but expect neurosurgery to lower as off pressors and > 180 -Ct Nimodipine  - Will drop midodrine to 5 mg TID then plan to DC 5/12 to help meet SBP goal.   Low grade fevers -Likely neurogenic - Afebrile 5/11 - WBC pending P: Trend WBC and fever curve Culture as is clinically indicated  Small left sided pleural effusion noted per CXR 5/10 RA during the day Requiring 2L [place] at bedtime Plan Trend CXR Titrate oxygen for sats > 94% OOB to chair Aggressive pulmonary toilet  Wide-complex tachycardia Prolonged QT interval -EKG 5/6 with imporved QTc 436 -Holding fenofibrate per pharmacy recs given it can affect QTc, (triglycerides 124 on 5/1) QTc 5/11 is 0.42 P: Per Cards AM labs now, CBC, BMET, Mag, Phos Continue p.o. amio 400 Maintain electrolytes: replete K/Mg Trend BMET daily EKG in am Monitor QTc Avoid QTc prolongation medications    Insulin dependent type 2 diabetes  -Home medications include Tresibas and Levemir  -Hemoglobin A1C 01/14/2020 P: SSI plus Levemir Goal CBG less than 180  Thrombocytopenia- improved.  HIT neg. -Change back from fondaparinux to Lovenox  SUMMARY OF TODAY'S PLAN:  Continued observation, Need SBP goal, vasospasm , Consider  transfer from ICU now off vasopressors within next 24 hours if he maintains BP goals on his own. Marland Kitchen  Best Practice / Goals of Care / Disposition.   Diet: heart healthy/ carb modified Pain/Anxiety/Delirium protocol (if indicated): PRNs VAP protocol (if indicated): Pulmonary hygiene  DVT prophylaxis: SCD, fondaparinux GI prophylaxis:PPI Glucose control: see above Mobility:  Bedrest Code Status: Full FAMILY DISCUSSIONS: Per primary DISPOSITION: continue Neuro ICU care  CCM APP time 32 minutes  Bevelyn Ngo, MSN, AGACNP-BC  Pulmonary/Critical Care Medicine Please See Amion for assignments and pager After 4 pm please call [phone] 01/29/2020

## 2020-01-29 NOTE — Progress Notes (Signed)
  Speech Language Pathology Treatment: Cognitive-Linquistic  Patient Details Name: Nicholas [name] MRN: [id number] DOB: 1951/04/23 Today's Date: 01/29/2020 Time: 1350-1420 SLP Time Calculation (min) (ACUTE ONLY): 30 min  Assessment / Plan / Recommendation Clinical Impression  Pt seen for cognitive/linguistic tx.  Demonstrates ongoing anosognosia: pt able to identify weakness in left arm as a consequence of SAH; required verbal prompts to identify additional impairments.  He did not identify left neglect as an issue, yet when reviewed, said "yes, they keep telling me that." He identified 3/6 items on his bedside table scanning from right to left, requiring cues to identify the remaining three items.  When looking for his call bell, and cued to ask himself where the item might be if out of his line of sight, he acknowledged it could be on his left side and successfully found it.  Selective/alternating attention tasks were modified from three to two categories of information to improve success. Pt with cognitive impersistence, requiring constant verbal cues to shift from one category of info to another, despite the fact that he could recall both categories.  He generated six items over 60" duration.    Recommend ongoing SLP to address attention, memory, awareness.     HPI HPI: Pt is a 69 year old male presented to the ED today for persistent headaches that have gradually worsened since yesterday evening. CT head 4/26: Large volume subarachnoid hemorrhage, right greater than left. Possible right tentorial subdural hemorrhage versus subarachnoid hemorrhage. [name] on 4/27. 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      SLP Plan  Continue with current plan of care       Recommendations                  Oral Care Recommendations: Oral  care BID Follow up Recommendations: Inpatient Rehab SLP Visit Diagnosis: Cognitive communication deficit (W96.759) Plan: Continue with current plan of care       GO                Nicholas [name] 01/29/2020, 2:27 PM  Nicholas Cranston L. Samson [name], MA CCC/SLP Acute Rehabilitation Services Office number [phone] Pager [phone]

## 2020-01-29 NOTE — Progress Notes (Signed)
Entered in error

## 2020-01-30 ENCOUNTER — Inpatient Hospital Stay (HOSPITAL_COMMUNITY): Payer: BC Managed Care – PPO

## 2020-01-30 DIAGNOSIS — I609 Nontraumatic subarachnoid hemorrhage, unspecified: Secondary | ICD-10-CM

## 2020-01-30 LAB — PHOSPHORUS: Phosphorus: 3.6 mg/dL (ref 2.5–4.6)

## 2020-01-30 LAB — BASIC METABOLIC PANEL
Anion gap: 9 (ref 5–15)
BUN: 9 mg/dL (ref 8–23)
CO2: 29 mmol/L (ref 22–32)
Calcium: 8.4 mg/dL — ABNORMAL LOW (ref 8.9–10.3)
Chloride: 97 mmol/L — ABNORMAL LOW (ref 98–111)
Creatinine, Ser: 0.7 mg/dL (ref 0.61–1.24)
GFR calc Af Amer: >60 mL/min (ref >60)
GFR calc non Af Amer: >60 mL/min (ref >60)
Glucose, Bld: 151 mg/dL — ABNORMAL HIGH (ref 70–99)
Potassium: 3.8 mmol/L (ref 3.5–5.1)
Sodium: 135 mmol/L (ref 135–145)

## 2020-01-30 LAB — GLUCOSE, CAPILLARY
Glucose-Capillary: 60 mg/dL — ABNORMAL LOW (ref 70–99)
Glucose-Capillary: 152 mg/dL — ABNORMAL HIGH (ref 70–99)
Glucose-Capillary: 136 mg/dL — ABNORMAL HIGH (ref 70–99)
Glucose-Capillary: 127 mg/dL — ABNORMAL HIGH (ref 70–99)
Glucose-Capillary: 48 mg/dL — ABNORMAL LOW (ref 70–99)
Glucose-Capillary: 42 mg/dL — CL (ref 70–99)
Glucose-Capillary: 80 mg/dL (ref 70–99)
Glucose-Capillary: 177 mg/dL — ABNORMAL HIGH (ref 70–99)
Glucose-Capillary: 149 mg/dL — ABNORMAL HIGH (ref 70–99)
Glucose-Capillary: 125 mg/dL — ABNORMAL HIGH (ref 70–99)

## 2020-01-30 LAB — CBC
HCT: 32.9 % — ABNORMAL LOW (ref 39.0–52.0)
Hemoglobin: 10.9 g/dL — ABNORMAL LOW (ref 13.0–17.0)
MCH: 29.1 pg (ref 26.0–34.0)
MCHC: 33.1 g/dL (ref 30.0–36.0)
MCV: 87.7 fL (ref 80.0–100.0)
Platelets: 316 10*3/uL (ref 150–400)
RBC: 3.75 MIL/uL — ABNORMAL LOW (ref 4.22–5.81)
RDW: 14.3 % (ref 11.5–15.5)
WBC: 11.7 10*3/uL — ABNORMAL HIGH (ref 4.0–10.5)
nRBC: 0 % (ref 0.0–0.2)

## 2020-01-30 LAB — MAGNESIUM: Magnesium: 1.9 mg/dL (ref 1.7–2.4)

## 2020-01-30 IMAGING — DX DG CHEST 1V PORT
1 series · 1 of 1 positions shown · non-contrast
Comparison: Chest x-ray dated [DATE].

CLINICAL DATA: Respiratory failure.

EXAM:
PORTABLE CHEST 1 VIEW

[chest]
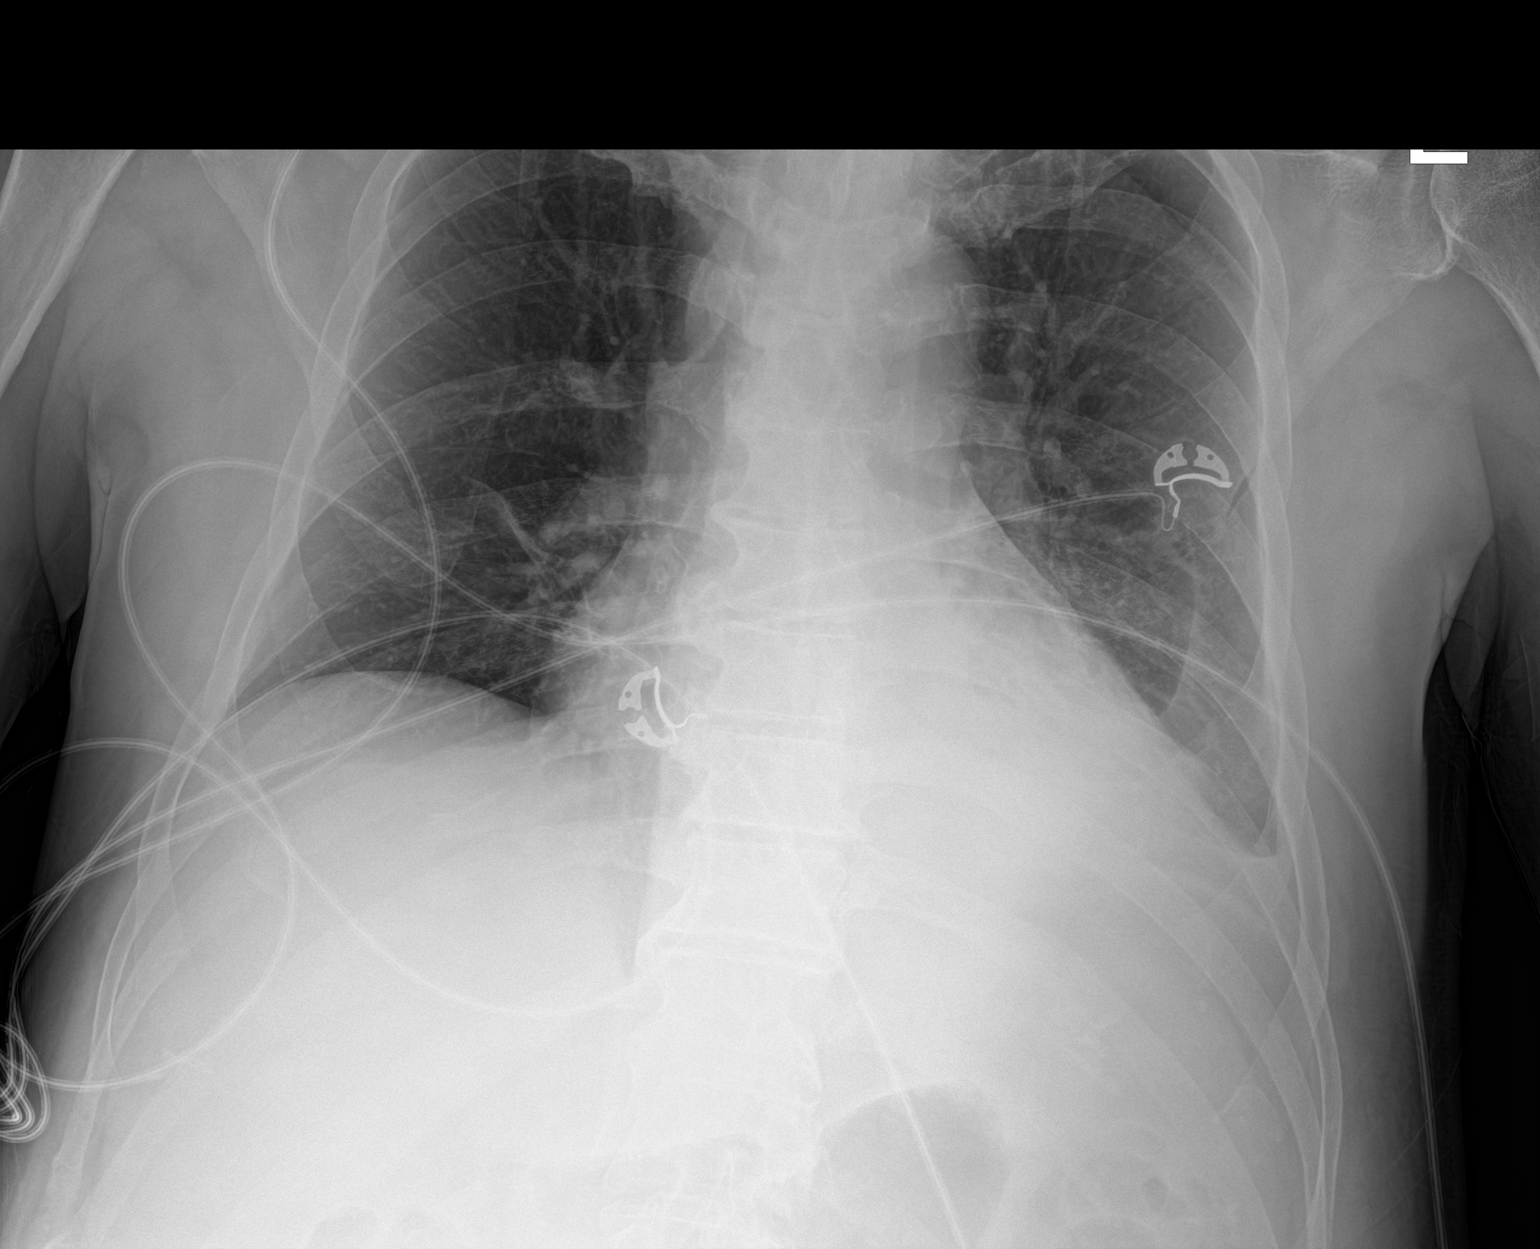

[1 of 1 positions shown; findings below may reference images not displayed]

FINDINGS: Interval removal of the left subclavian central venous catheter.
Stable cardiomediastinal silhouette. Unchanged small left pleural
effusion with adjacent left lower lobe opacity. The right lung is
clear. No pneumothorax. No acute osseous abnormality.
IMPRESSION: 1. Unchanged small left pleural effusion with adjacent left lower
lobe atelectasis versus infiltrate.

## 2020-01-30 MED ORDER — DEXTROSE 50 % IV SOLN
INTRAVENOUS | Status: AC
  Filled 2020-01-30: qty 50

## 2020-01-30 MED ORDER — INSULIN ASPART 100 UNIT/ML ~~LOC~~ SOLN: 0.0000 [IU] | Freq: Three times a day (TID) | SUBCUTANEOUS | Status: DC

## 2020-01-30 MED ORDER — DEXTROSE 50 % IV SOLN
INTRAVENOUS | Status: AC
  Administered 2020-01-30: 50 mL
  Filled 2020-01-30: qty 50

## 2020-01-30 MED ORDER — INSULIN ASPART 100 UNIT/ML ~~LOC~~ SOLN: 8.0000 [IU] | Freq: Three times a day (TID) | SUBCUTANEOUS | Status: DC

## 2020-01-30 MED ORDER — AMIODARONE HCL 200 MG PO TABS
200.0000 mg | ORAL_TABLET | Freq: Every day | ORAL | Status: DC
  Administered 2020-01-30 – 2020-01-31 (×2): 200 mg via ORAL
  Filled 2020-01-30: qty 1

## 2020-01-30 MED ORDER — DEXTROSE 50 % IV SOLN
1.0000 | Freq: Once | INTRAVENOUS | Status: AC
  Administered 2020-01-30: 50 mL via INTRAVENOUS

## 2020-01-30 MED ORDER — INSULIN DETEMIR 100 UNIT/ML ~~LOC~~ SOLN
35.0000 [IU] | Freq: Two times a day (BID) | SUBCUTANEOUS | Status: DC
  Filled 2020-01-30: qty 0.35

## 2020-01-30 MED ORDER — INSULIN DETEMIR 100 UNIT/ML ~~LOC~~ SOLN
28.0000 [IU] | Freq: Two times a day (BID) | SUBCUTANEOUS | Status: DC
  Administered 2020-01-30 – 2020-01-31 (×2): 28 [IU] via SUBCUTANEOUS
  Filled 2020-01-30 (×5): qty 0.28

## 2020-01-30 NOTE — Progress Notes (Addendum)
Physical Therapy Treatment Patient Details Name: Nicholas [name] MRN: [id number] DOB: 1951-09-09 Today's Date: 01/30/2020    History of Present Illness 69 y.o. male admitted on 01/14/20 for HA and confusion found to have large volume SAH with bil ICA, MCA, BA vasospasm s/p angioplasty.  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 Pt also having runs of V-tach, so cardiology consulted.  Pt with significant PMH of DM, HTN, herpes simplex virus-1, hyperlipidemia, Hepatitis A.      PT Comments    Patient with decreased verbalizations this session and needing encouragement to participate shaking his head throughout session and not willing to eat or drink  Noted L UE with some return, but pt difficulty localizing motor aspect.  Limited sitting and did not get up to chair due to BP below 160 parameters.  Feel he will benefit from skilled PT in the acute setting and hopeful for follow up CIR level rehab at d/c.  174/65 supine, 139/76 initially sitting, 135/74 sitting two minutes, 118/68 after attempted stand so return to supine 155/91; after supine for 2 minutes 179/76.  Follow Up Recommendations  CIR     Equipment Recommendations  Wheelchair (measurements PT);Wheelchair cushion (measurements PT);Hospital bed    Recommendations for Other Services       Precautions / Restrictions Precautions Precautions: Fall Precaution Comments: Lt hemiparesis with Lt neglect  Restrictions Other Position/Activity Restrictions: keep BP >160    Mobility  Bed Mobility Overal bed mobility: Needs Assistance Bed Mobility: Rolling;Sidelying to Sit;Sit to Supine Rolling: Max assist Sidelying to sit: Max assist;+2 for physical assistance   Sit to supine: Max assist;+2 for physical assistance   General bed mobility comments: assist to bend knees and L hand across him to roll to R, assist for legs off bed and trunk upright, to supine assist for trunk  and legs and repositioning  Transfers Overall transfer level: Needs assistance Equipment used: Ambulation equipment used Transfers: Sit to/from Stand Sit to Stand: +2 physical assistance;Total assist         General transfer comment: pt not initiating sit to stand despite two trials with assist for hands on stedy and lifting hips with pad under him, BP too low to continue so returned to supine  Ambulation/Gait                 Stairs             Wheelchair Mobility    Modified Rankin (Stroke Patients Only) Modified Rankin (Stroke Patients Only) Pre-Morbid Rankin Score: No symptoms Modified Rankin: Severe disability     Balance Overall balance assessment: Needs assistance Sitting-balance support: Feet supported Sitting balance-Leahy Scale: Poor Sitting balance - Comments: initially with minguard for sitting balance, then attempting to get weigh on L UE and pt leaning forward, grabbing linens with R hand near catheter so held his hand and he would lean to L and forward when holding my hand on the R, with R and L HHA pt with improved balance, but still sagging forward with fatigue Postural control: Left lateral lean     Standing balance comment: unable to stand this session                            Cognition Arousal/Alertness: Awake/alert Behavior During Therapy: Flat affect Overall Cognitive Status: Impaired/Different from baseline Area of Impairment: Following commands;Awareness;Problem solving;Attention;Safety/judgement  Current Attention Level: Focused Memory: Decreased short-term memory Following Commands: Follows one step commands inconsistently Safety/Judgement: Decreased awareness of safety;Decreased awareness of deficits Awareness: Emergent Problem Solving: Slow processing;Decreased initiation;Requires verbal cues;Requires tactile cues;Difficulty sequencing General Comments: nonverbal this session except to say  Ow! when something hurts him      Exercises Other Exercises Other Exercises: AAROM/PROM bilat UE/LE's in supine    General Comments General comments (skin integrity, edema, etc.): see BP measurements under clinical impression statement      Pertinent Vitals/Pain Pain Assessment: Faces Faces Pain Scale: Hurts little more Pain Location: grimacing with moving neck Pain Descriptors / Indicators: Grimacing;Discomfort Pain Intervention(s): Monitored during session;Repositioned    Home Living                      Prior Function            PT Goals (current goals can now be found in the care plan section) Progress towards PT goals: Progressing toward goals(slowly)    Frequency    Min 4X/week      PT Plan Current plan remains appropriate    Co-evaluation              AM-PAC PT "6 Clicks" Mobility   Outcome Measure  Help needed turning from your back to your side while in a flat bed without using bedrails?: A Lot Help needed moving from lying on your back to sitting on the side of a flat bed without using bedrails?: Total Help needed moving to and from a bed to a chair (including a wheelchair)?: Total Help needed standing up from a chair using your arms (e.g., wheelchair or bedside chair)?: Total Help needed to walk in hospital room?: Total Help needed climbing 3-5 steps with a railing? : Total 6 Click Score: 7    End of Session   Activity Tolerance: Treatment limited secondary to medical complications (Comment);Patient limited by fatigue(BP under parameters) Patient left: in bed;with call bell/phone within reach Nurse Communication: Other (comment)(BP limited tx) PT Visit Diagnosis: Muscle weakness (generalized) (M62.81);Hemiplegia and hemiparesis Hemiplegia - Right/Left: Left Hemiplegia - dominant/non-dominant: Non-dominant Hemiplegia - caused by: Nontraumatic intracerebral hemorrhage     Time: 1355-1422 PT Time Calculation (min) (ACUTE ONLY): 27  min  Charges:  $Therapeutic Activity: 23-37 mins                     Magda Kiel, [place] [facility] [phone] 01/30/2020    Reginia Naas 01/30/2020, 3:01 PM

## 2020-01-30 NOTE — Progress Notes (Signed)
Progress Note  Patient Name: Nicholas [name] Date of Encounter: 01/30/2020  Primary Cardiologist:  Chrystie Nose, MD  Subjective   No cardiac complaints working with PT/OT  Telemetry with no significant ectopy  Inpatient Medications    Scheduled Meds: . amiodarone  200 mg Oral Daily  . bisacodyl  10 mg Oral Q0600  . Chlorhexidine Gluconate Cloth  6 each Topical Daily  . cholecalciferol  1,000 Units Oral Daily  . enoxaparin (LOVENOX) injection  40 mg Subcutaneous Q24H  . insulin aspart  0-24 Units Subcutaneous Q4H  . insulin aspart  12 Units Subcutaneous TID WC  . insulin detemir  35 Units Subcutaneous QHS  . insulin detemir  40 Units Subcutaneous Daily  . levETIRAcetam  500 mg Oral BID  . magnesium hydroxide  30 mL Oral Daily  . midodrine  5 mg Oral TID WC  . niMODipine  30 mg Oral Q2H   Or  . niMODipine  30 mg Oral Q2H  . rosuvastatin  40 mg Oral Daily  . senna-docusate  1 tablet Oral BID   Continuous Infusions: . sodium chloride 0 mL/hr at 01/14/20 1126  . 0.9 % NaCl with KCl 20 mEq / L 75 mL/hr at 01/30/20 0800  . phenylephrine (NEO-SYNEPHRINE) Adult infusion Stopped (01/28/20 1833)  . vasopressin (PITRESSIN) infusion - *FOR SHOCK* Stopped (01/28/20 1246)   PRN Meds: sodium chloride, acetaminophen **OR** acetaminophen (TYLENOL) oral liquid 160 mg/5 mL **OR** acetaminophen, bisacodyl, HYDROmorphone (DILAUDID) injection, methocarbamol, oxyCODONE-acetaminophen, simethicone   Vital Signs    Vitals:   01/30/20 0500 01/30/20 0600 01/30/20 0700 01/30/20 0800  BP: (!) 176/73 (!) 170/70 (!) 169/72 (!) 177/86  Pulse: 86 84 89 85  Resp: 14 15 16  (!) 22  Temp:    99.1 F (37.3 C)  TempSrc:    Oral  SpO2: 93% (!) 88% 91% 90%  Weight:      Height:        Intake/Output Summary (Last 24 hours) at 01/30/2020 0925 Last data filed at 01/30/2020 0800 Gross per 24 hour  Intake 2157.14 ml  Output 7050 ml  Net -4892.86 ml   Filed Weights   01/21/20 0500 01/22/20 0500  01/28/20 0332  Weight: 90.3 kg 96.5 kg 92.2 kg   Last Weight  Most recent update: 01/28/2020  3:33 AM   Weight  92.2 kg (203 lb 4.2 oz)           Weight change:    Telemetry    NSR 01/30/2020   ECG    N/A  Physical Exam   Affect appropriate Healthy:  appears stated age HEENT: normal Neck supple with no adenopathy JVP normal no bruits no thyromegaly Lungs clear with no wheezing and good diaphragmatic motion Heart:  S1/S2 no murmur, no rub, gallop or click PMI normal Abdomen: benighn, BS positve, no tenderness, no AAA no bruit.  No HSM or HJR Distal pulses intact with no bruits No edema Left sided neglect and hemiparesis  Skin warm and dry No muscular weakness   Labs    Hematology Recent Labs  Lab 01/28/20 0858 01/29/20 0648 01/30/20 0450  WBC 19.5* 14.3* 11.7*  RBC 3.69* 3.80* 3.75*  HGB 10.8* 10.9* 10.9*  HCT 32.2* 33.1* 32.9*  MCV 87.3 87.1 87.7  MCH 29.3 28.7 29.1  MCHC 33.5 32.9 33.1  RDW 14.4 14.4 14.3  PLT 262 277 316    Chemistry Recent Labs  Lab 01/24/20 0550 01/24/20 1732 01/28/20 0858 01/29/20 03/30/20 01/30/20 0450  NA 136   < > 134* 138 135  K 2.9*   < > 3.6 3.5 3.8  CL 100   < > 97* 98 97*  CO2 28   < > 27 28 29   GLUCOSE 152*   < > 122* 72 151*  BUN 15   < > 11 10 9   CREATININE 0.66   < > 0.64 0.64 0.70  CALCIUM 8.2*   < > 8.5* 8.7* 8.4*  ALBUMIN 2.6*  --   --   --   --   GFRNONAA >60   < > >60 >60 >60  GFRAA >60   < > >60 >60 >60  ANIONGAP 8   < > 10 12 9    < > = values in this interval not displayed.     High Sensitivity Troponin:  No results for input(s): TROPONINIHS in the last 720 hours.    BNPNo results for input(s): BNP, PROBNP in the last 168 hours.   Magnesium  Date Value Ref Range Status  01/30/2020 1.9 1.7 - 2.4 mg/dL Final    Comment:    Performed at [facility], [address], [place], [facility] [address]     Radiology    DG CHEST PORT 1 VIEW  Result Date: 01/30/2020 CLINICAL DATA:  Respiratory  failure. EXAM: PORTABLE CHEST 1 VIEW COMPARISON:  Chest x-ray dated Jan 28, 2020. FINDINGS: Interval removal of the left subclavian central venous catheter. Stable cardiomediastinal silhouette. Unchanged small left pleural effusion with adjacent left lower lobe opacity. The right lung is clear. No pneumothorax. No acute osseous abnormality. IMPRESSION: 1. Unchanged small left pleural effusion with adjacent left lower lobe atelectasis versus infiltrate. Electronically Signed   By: [id number] M.D.   On: 01/30/2020 07:54   DG CHEST PORT 1 VIEW  Result Date: 01/28/2020 CLINICAL DATA:  Left arm swelling and leaking central venous line EXAM: PORTABLE CHEST 1 VIEW COMPARISON:  01/22/2020 FINDINGS: Cardiac shadow is stable. Small left-sided pleural effusion is noted increased from the prior exam. No focal confluent infiltrate is seen. The previously seen left subclavian central line has withdrawn and now has a loop likely underneath the skin with the catheter tip in the central left subclavian vein near its junction with the internal jugular vein. No other focal abnormality is seen. IMPRESSION: New left-sided pleural effusion when compare with the prior exam. Left-sided central line has withdrawn significantly with looping likely underneath the skin. Correlate with the clinical exam. This may require removal and replacement at a separate site. Electronically Signed   By: 03/31/2020 M.D.   On: 01/28/2020 16:37   VAS 03/23/2020 TRANSCRANIAL DOPPLER  Result Date: 01/28/2020  Transcranial Doppler Indications: Subarachnoid hemorrhage. Performing Technologist: 03/29/2020 RDMS, RVT  Examination Guidelines: A complete evaluation includes B-mode imaging, spectral Doppler, color Doppler, and power Doppler as needed of all accessible portions of each vessel. Bilateral testing is considered an integral part of a complete examination. Limited examinations for reoccurring indications may be performed as noted.   +----------+-------------+----------+-----------+--------------+ RIGHT TCD Right VM (cm)Depth (cm)Pulsatility   Comment     +----------+-------------+----------+-----------+--------------+ MCA          191.00                 1.33                   +----------+-------------+----------+-----------+--------------+ ACA          -48.00  0.8                   +----------+-------------+----------+-----------+--------------+ Term ICA      63.00                 1.42                   +----------+-------------+----------+-----------+--------------+ PCA           45.00                 1.24                   +----------+-------------+----------+-----------+--------------+ Opthalmic     36.00                 2.07                   +----------+-------------+----------+-----------+--------------+ ICA siphon    84.00                 1.36                   +----------+-------------+----------+-----------+--------------+ Vertebral                                   not visualized +----------+-------------+----------+-----------+--------------+  +----------+------------+----------+-----------+--------------+ LEFT TCD  Left VM (cm)Depth (cm)Pulsatility   Comment     +----------+------------+----------+-----------+--------------+ MCA          89.00                 1.52                   +----------+------------+----------+-----------+--------------+ ACA         -103.00                1.44                   +----------+------------+----------+-----------+--------------+ Term ICA                                   not visualized +----------+------------+----------+-----------+--------------+ PCA          -43.00                1.79                   +----------+------------+----------+-----------+--------------+ Opthalmic    30.00                 1.99                   +----------+------------+----------+-----------+--------------+ ICA  siphon   67.00                 1.75                   +----------+------------+----------+-----------+--------------+ Vertebral                                  not visualized +----------+------------+----------+-----------+--------------+  +------------+-------+--------------+             VM cm/s   Comment     +------------+-------+--------------+ Prox Basilar       not visualized +------------+-------+--------------+ Summary:  Persistently elevated mean flow velocities in right middle cerebral suggestive of moderate vasospam and in  right anterior cerebral suggestive of mild vasospasm. *See table(s) above for TCD measurements and observations.  Diagnosing physician: Delia Heady MD Electronically signed by Delia Heady MD on 01/28/2020 at 2:06:19 PM.    Final      Cardiac Studies   ECHO:  01/15/2020 1. Normal LV systolic function; proximal septal thickening; grade 1  diastolic dysfunction; mildly dilated aortic root; scerotic aortic valve.  2. Left ventricular ejection fraction, by estimation, is 70 to 75%. The  left ventricle has hyperdynamic function. The left ventricle has no  regional wall motion abnormalities. There is mild left ventricular  hypertrophy of the basal segment. Left  ventricular diastolic parameters are consistent with Grade I diastolic  dysfunction (impaired relaxation).  3. Right ventricular systolic function is normal. The right ventricular  size is normal.  4. The mitral valve is normal in structure. No evidence of mitral valve  regurgitation. No evidence of mitral stenosis.  5. The aortic valve is tricuspid. Aortic valve regurgitation is not  visualized. Mild to moderate aortic valve sclerosis/calcification is  present, without any evidence of aortic stenosis.  6. Aortic dilatation noted. There is mild dilatation of the aortic root  measuring 38 mm.  7. The inferior vena cava is normal in size with greater than 50%  respiratory variability,  suggesting right atrial pressure of 3 mmHg.   Patient Profile     69 y.o. male w/ hx BPH, colon polyp, deviated septum, DM2, hepatitis A, HSV-1 infection, HLD, HTN, migraine headache was admitted 04/26 w/ SAH, clinical vasospasm of basilar, MCA and right ICA s/p angioplasty. Because of the vasospasm, permissive HTN w/ SBP goal 180-220.  Assessment & Plan     1. Arrhythmia:  Maintaining NSR no ectopy this am On oral amiodarone no anticoagulation with SAH. Will decrease amiodarone to 200 mg daily can probably stop in 4 weeks Ok to go to CIR 2. BP: Pressors off per neuro " allowing to auto regulate on nimodipine per neuro  Cardiology will sign off   Charlton Haws 9:25 AM 01/30/2020

## 2020-01-30 NOTE — Progress Notes (Signed)
Transcranial Doppler  Date POD PCO2 HCT BP  MCA ACA PCA OPHT SIPH VERT Basilar  4/27 MS     Right  Left   197  64   *  105   12  -17   27  23    150  29   -23  -31   -36      4/28,rs     Right  Left   134  131   -31  -92   38  -24   57  50   153  136   -18  -31   -42      5/1 GC     Right  Left   96  50   *  *   30  37   30  25   14   95   -60  -22   -63      5/3 MR      Right  Left   127  59   -14  *   36  *   47  34   36  103   -42  -18   -62      5/5      Right  Left                                       5/7     Right  Left                                       5/10   5/12 GC     Right  Left  Right  Left 191  89  99  47 -48  -103  -24  * 45  -43  34  22 36  30  34  24 84  67  72  45 *  *  *  * *    *   MCA = Middle Cerebral Artery      OPHT = Opthalmic Artery     BASILAR = Basilar Artery   ACA = Anterior Cerebral Artery     SIPH = Carotid Siphon PCA = Posterior Cerebral Artery   VERT = Verterbral Artery                   Normal MCA = 62+\-12 ACA = 50+\-12 PCA = 42+\-23  *Unable to insonate 01/15/2020- Right Lindegaard ratio=7.3, left Lindegaard ratio=1.68 01/16/20 - Right Lindegaard ratio = 3.3, left Lindegaard ratio = 6.2   01/30/2020 12:49 PM

## 2020-01-30 NOTE — Progress Notes (Signed)
PROGRESS NOTE    Nicholas [name]   NAT:[id number]  DOB: March 13, 1951  DOA: 01/14/2020 PCP: Gillian Scarce, MD   Brief Narrative:  Nicholas [name] is a 69 year old male with a past medical history diabetes mellitus, hyperlipidemia, hypertension and migraine headaches who presented on 4/26 for a severe headache, nausea and vomiting and was admitted by neurosurgery for subarachnoid hemorrhage suspected to be due to an aneurysm rupture. Patient has had an extensive ICU course and has been transitioned to triad hospitalist to continue to manage as consultants.   MRI head 5/1 - Multiple small acute infarcts the right cerebral hemisphere primarily involving the anterior greater than posterior circulations. Thin left larger than right subdural collections with stable mild rightward midline shift. Well is that 4/30 > Diagnostic cerebral angiogram with balloon angioplasty 4/30 CVC triple lumen catheter (left subclavian) 01/18/20 A-line 4/30 >> 5/1 5/4 > Remains on two vasopressors for escalated map goal per neuro/neurosx 5/5 wide-complex tachycardia runs, worsening hypotension with Lopressor, started on amio  Subjective: Hypoglycemic earlier today. He has not been eating well per his wife.  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   NSVT (nonsustained ventricular tachycardia)   -Cardiology following-patient is on oral amiodarone-Per cardiology, okay to stop and okay to DC to CIR -Avoiding anticoagulation due to subarachnoid hemorrhage  Dyslipidemia    Ref. Range 01/19/2020 04:56   Total CHOL/HDL Ratio Latest Units: RATIO 3.4  Cholesterol Latest Ref Range: 0 - 200 mg/dL 88  HDL Cholesterol Latest Ref Range: >40 mg/dL 26 (L)  LDL (calc) Latest Ref Range: 0 - 99 mg/dL 37  Triglycerides Latest Ref Range: <150 mg/dL 427  VLDL Latest Ref Range: 0 - 40 mg/dL 25   - lipid controlled on Lipitor, Fenofibrate and Lovaza at home - currently on Crestor - OK to resume Fenofibrate on discharge   DM, insulin requiring - A1c is 7.7 - home meds listed as > Novolog, Trisiba, Levemir (PRN), Xigduo, Glipizide, Actos and Janumet - currently receiving Levemir, Novolog and ISS Q 4 hrs - as he had hypoglycemia twice today and his sugars have been quite normal, I will cut back his Levemir from 35 to 28 U and stop his mealtime insulin - I will watch his sugars and resume insulin once I see how much he is eating and how high his sugars are     Essential hypertension - home med, lisinopril on hold- cont Nimodipine per primary    Time spent in minutes: 45 DVT prophylaxis:  SCDs Code Status: Full code Family Communication:  Consultants:   TRH  Pulmonary critical care  Neurology/stroke team  Cardiology  Palliative care    Procedures: 2 d Echo 4/27 1. Normal LV systolic function; proximal septal thickening; grade 1  diastolic dysfunction; mildly dilated aortic root; scerotic aortic valve.  2. Left ventricular ejection fraction, by estimation, is 70 to 75%. The  left ventricle has hyperdynamic function. The left ventricle has no  regional wall motion abnormalities. There is mild left ventricular  hypertrophy of the basal segment. Left  ventricular diastolic parameters are consistent with Grade I diastolic  dysfunction (impaired relaxation).  3. Right ventricular  systolic function is normal. The right ventricular  size is normal.  4. The mitral valve is normal in structure. No evidence of mitral valve  regurgitation. No evidence of mitral stenosis.  5. The aortic valve  is tricuspid. Aortic valve regurgitation is not  visualized. Mild to moderate aortic valve sclerosis/calcification is  present, without any evidence of aortic stenosis.  6. Aortic dilatation noted. There is mild dilatation of the aortic root  measuring 38 mm.  7. The inferior vena cava is normal in size with greater than 50%  respiratory variability, suggesting right atrial pressure of 3 mmHg.   Antimicrobials:  Anti-infectives (From admission, onward)   Start     Dose/Rate Route Frequency Ordered Stop   01/15/20 1200  vancomycin (VANCOREADY) IVPB 2000 mg/400 mL  Status:  Discontinued     2,000 mg 200 mL/hr over 120 Minutes Intravenous Every 24 hours 01/14/20 1125 01/14/20 1351   01/14/20 2200  ceFEPIme (MAXIPIME) 2 g in sodium chloride 0.9 % 100 mL IVPB  Status:  Discontinued     2 g 200 mL/hr over 30 Minutes Intravenous Every 8 hours 01/14/20 1122 01/14/20 1840   01/14/20 1500  metroNIDAZOLE (FLAGYL) IVPB 500 mg  Status:  Discontinued     500 mg 100 mL/hr over 60 Minutes Intravenous  Once 01/14/20 1352 01/14/20 1840   01/14/20 1430  vancomycin (VANCOREADY) IVPB 2000 mg/400 mL  Status:  Discontinued     2,000 mg 200 mL/hr over 120 Minutes Intravenous Every 24 hours 01/14/20 1351 01/14/20 1840   01/14/20 1130  vancomycin (VANCOCIN) IVPB 1000 mg/200 mL premix  Status:  Discontinued     1,000 mg 200 mL/hr over 60 Minutes Intravenous  Once 01/14/20 1125 01/14/20 1351   01/14/20 1112  sodium chloride 0.9 % with ceFEPIme (MAXIPIME) ADS Med  Status:  Discontinued    Note to Pharmacy: Ihor Dow   : cabinet override      01/14/20 1112 01/14/20 1950   01/14/20 1100  ceFEPIme (MAXIPIME) 2 g in sodium chloride 0.9 % 100 mL IVPB     2 g 200 mL/hr over 30 Minutes Intravenous  Once 01/14/20 1050 01/14/20 1439   01/14/20 1100  metroNIDAZOLE (FLAGYL) IVPB 500 mg  Status:  Discontinued     500 mg 100 mL/hr over 60 Minutes Intravenous  Once 01/14/20 1050 01/14/20 1352   01/14/20 1100   vancomycin (VANCOCIN) IVPB 1000 mg/200 mL premix  Status:  Discontinued     1,000 mg 200 mL/hr over 60 Minutes Intravenous  Once 01/14/20 1050 01/14/20 1351       Objective: Vitals:   01/30/20 0900 01/30/20 1000 01/30/20 1100 01/30/20 1200  BP: (!) 168/65 (!) 172/81 (!) 176/80 (!) 198/68  Pulse: 93 95 83 (!) 106  Resp: (!) 21 19 13 19   Temp:    (!) 97.4 F (36.3 C)  TempSrc:    Axillary  SpO2: 91% 96% 95% 91%  Weight:      Height:        Intake/Output Summary (Last 24 hours) at 01/30/2020 1314 Last data filed at 01/30/2020 1200 Gross per 24 hour  Intake 1692.73 ml  Output 7200 ml  Net -5507.27 ml   Filed Weights   01/21/20 0500 01/22/20 0500 01/28/20 0332  Weight: 90.3 kg 96.5 kg 92.2 kg    Examination: General exam: Appears comfortable  Respiratory system: Clear to auscultation. Respiratory effort normal. Cardiovascular system: S1 & S2 heard, RRR.   Gastrointestinal system: Abdomen soft, non-tender, nondistended.  Normal bowel sounds. Extremities: No cyanosis, clubbing or edema       Data Reviewed: I have personally reviewed following labs and imaging studies  CBC: Recent Labs  Lab 01/24/20 0550 01/24/20 0550 01/25/20 0544 01/26/20 0415 01/28/20 0858 01/29/20 0648 01/30/20 0450  WBC 24.3*   < > 26.2* 23.1* 19.5* 14.3* 11.7*  NEUTROABS 20.4*  --  23.5* 20.6*  --   --   --   HGB 13.8   < > 12.0* 11.9* 10.8* 10.9* 10.9*  HCT 40.8   < > 36.0* 35.3* 32.2* 33.1* 32.9*  MCV 86.8   < > 86.1 86.5 87.3 87.1 87.7  PLT 125*   < > 128* 169 262 277 316   < > = values in this interval not displayed.   Basic Metabolic Panel: Recent Labs  Lab 01/24/20 0550 01/24/20 1732 01/25/20 0544 01/25/20 1503 01/25/20 2230 01/25/20 2230 01/26/20 0415 01/26/20 0415 01/26/20 1421 01/27/20 0600 01/28/20 0858 01/29/20 0648 01/30/20 0450  NA 136   < > 135   < > 133*   < > 133*   < > 132* 133* 134* 138 135  K 2.9*   < > 3.2*   < > 3.5   < > 3.5   < > 3.5 3.8 3.6 3.5 3.8    CL 100   < > 97*   < > 94*   < > 94*   < > 95* 96* 97* 98 97*  CO2 28   < > 25   < > 28   < > 27   < > 28 27 27 28 29   GLUCOSE 152*   < > 130*   < > 178*   < > 185*   < > 125* 158* 122* 72 151*  BUN 15   < > 13   < > 9   < > 10   < > 8 11 11 10 9   CREATININE 0.66   < > 0.65   < > 0.62   < > 0.67   < > 0.57* 0.56* 0.64 0.64 0.70  CALCIUM 8.2*   < > 8.4*   < > 8.0*   < > 8.0*   < > 8.0* 8.2* 8.5* 8.7* 8.4*  MG 1.7   < > 1.7  --  1.8  --  2.1  --   --  1.9  --  1.7 1.9  PHOS 2.5  --  2.7  --   --   --   --   --   --   --   --  4.0 3.6   < > = values in this interval not displayed.   GFR: Estimated Creatinine Clearance: 99.7 mL/min (by C-G formula based on SCr of 0.7 mg/dL). Liver Function Tests: Recent Labs  Lab 01/24/20 0550  ALBUMIN 2.6*   No results for input(s): LIPASE, AMYLASE in the last 168 hours. No results for input(s): AMMONIA in the last 168 hours. Coagulation Profile: No results for input(s): INR, PROTIME in the last 168 hours. Cardiac Enzymes: No results for input(s): CKTOTAL, CKMB, CKMBINDEX, TROPONINI in the last 168 hours. BNP (last 3 results) No results for input(s): PROBNP in the last 8760 hours. HbA1C: No results for input(s): HGBA1C in the last 72 hours. CBG: Recent Labs  Lab 01/30/20 0314 01/30/20 0644 01/30/20 0815 01/30/20 1152 01/30/20 1222  GLUCAP 60* 152* 136* 48* 127*   Lipid Profile: No results for input(s): CHOL, HDL, LDLCALC, TRIG, CHOLHDL, LDLDIRECT  in the last 72 hours. Thyroid Function Tests: No results for input(s): TSH, T4TOTAL, FREET4, T3FREE, THYROIDAB in the last 72 hours. Anemia Panel: No results for input(s): VITAMINB12, FOLATE, FERRITIN, TIBC, IRON, RETICCTPCT in the last 72 hours. Urine analysis:    Component Value Date/Time   COLORURINE STRAW (A) 01/19/2020 1058   APPEARANCEUR CLEAR 01/19/2020 1058   LABSPEC 1.008 01/19/2020 1058   PHURINE 7.0 01/19/2020 1058   GLUCOSEU >=500 (A) 01/19/2020 1058   HGBUR NEGATIVE 01/19/2020  1058   BILIRUBINUR NEGATIVE 01/19/2020 1058   KETONESUR NEGATIVE 01/19/2020 1058   PROTEINUR 30 (A) 01/19/2020 1058   NITRITE NEGATIVE 01/19/2020 1058   LEUKOCYTESUR NEGATIVE 01/19/2020 1058   Sepsis Labs: @LABRCNTIP (procalcitonin:4,lacticidven:4) )No results found for this or any previous visit (from the past 240 hour(s)).       Radiology Studies: DG CHEST PORT 1 VIEW  Result Date: 01/30/2020 CLINICAL DATA:  Respiratory failure. EXAM: PORTABLE CHEST 1 VIEW COMPARISON:  Chest x-ray dated Jan 28, 2020. FINDINGS: Interval removal of the left subclavian central venous catheter. Stable cardiomediastinal silhouette. Unchanged small left pleural effusion with adjacent left lower lobe opacity. The right lung is clear. No pneumothorax. No acute osseous abnormality. IMPRESSION: 1. Unchanged small left pleural effusion with adjacent left lower lobe atelectasis versus infiltrate. Electronically Signed   By: Obie DredgeWilliam T Derry M.D.   On: 01/30/2020 07:54   DG CHEST PORT 1 VIEW  Result Date: 01/28/2020 CLINICAL DATA:  Left arm swelling and leaking central venous line EXAM: PORTABLE CHEST 1 VIEW COMPARISON:  01/22/2020 FINDINGS: Cardiac shadow is stable. Small left-sided pleural effusion is noted increased from the prior exam. No focal confluent infiltrate is seen. The previously seen left subclavian central line has withdrawn and now has a loop likely underneath the skin with the catheter tip in the central left subclavian vein near its junction with the internal jugular vein. No other focal abnormality is seen. IMPRESSION: New left-sided pleural effusion when compare with the prior exam. Left-sided central line has withdrawn significantly with looping likely underneath the skin. Correlate with the clinical exam. This may require removal and replacement at a separate site. Electronically Signed   By: Alcide CleverMark  Lukens M.D.   On: 01/28/2020 16:37   VAS US TRANSCRANIAL DOPPLER  Result Date: 01/30/2020   Transcranial Doppler Indications: Subarachnoid hemorrhage. Limitations: Patient positioning, patient movement Limitations for diagnostic windows: Unable to insonate left transtemporal window. Unable to insonate foramen window. Comparison Study: 01/28/2020 Performing Technologist: Chanda BusingGregory Collins RVT  Examination Guidelines: A complete evaluation includes B-mode imaging, spectral Doppler, color Doppler, and power Doppler as needed of all accessible portions of each vessel. Bilateral testing is considered an integral part of a complete examination. Limited examinations for reoccurring indications may be performed as noted.  +----------+-------------+----------+-----------+-------+ RIGHT TCD Right VM (cm)Depth (cm)PulsatilityComment +----------+-------------+----------+-----------+-------+ MCA           99.00                 1.35            +----------+-------------+----------+-----------+-------+ ACA          -24.00                 1.69            +----------+-------------+----------+-----------+-------+ Term ICA     122.00                  1.3            +----------+-------------+----------+-----------+-------+ PCA  34.00                 1.55            +----------+-------------+----------+-----------+-------+ Opthalmic     34.00                 2.23            +----------+-------------+----------+-----------+-------+ ICA siphon    72.00                 1.36            +----------+-------------+----------+-----------+-------+  +----------+------------+----------+-----------+-------+ LEFT TCD  Left VM (cm)Depth (cm)PulsatilityComment +----------+------------+----------+-----------+-------+ MCA          47.00                 1.29            +----------+------------+----------+-----------+-------+ PCA          22.00                 2.02            +----------+------------+----------+-----------+-------+ Opthalmic    24.00                 2.14             +----------+------------+----------+-----------+-------+ ICA siphon   45.00                 1.69            +----------+------------+----------+-----------+-------+     Preliminary       Scheduled Meds: . amiodarone  200 mg Oral Daily  . bisacodyl  10 mg Oral Q0600  . Chlorhexidine Gluconate Cloth  6 each Topical Daily  . cholecalciferol  1,000 Units Oral Daily  . enoxaparin (LOVENOX) injection  40 mg Subcutaneous Q24H  . insulin aspart  0-24 Units Subcutaneous Q4H  . insulin aspart  8 Units Subcutaneous TID WC  . insulin detemir  35 Units Subcutaneous BID  . levETIRAcetam  500 mg Oral BID  . magnesium hydroxide  30 mL Oral Daily  . niMODipine  30 mg Oral Q2H   Or  . niMODipine  30 mg Oral Q2H  . rosuvastatin  40 mg Oral Daily  . senna-docusate  1 tablet Oral BID   Continuous Infusions: . sodium chloride 0 mL/hr at 01/14/20 1126  . 0.9 % NaCl with KCl 20 mEq / L 75 mL/hr at 01/30/20 1200     LOS: 16 days      Debbe Odea, MD [facility] Pager: [email or web address] 01/30/2020, 1:14 PM

## 2020-01-30 NOTE — Progress Notes (Signed)
  NEUROSURGERY PROGRESS NOTE   No issues overnight.   EXAM:  BP (!) 177/86   Pulse 85   Temp 99.1 F (37.3 C) (Oral)   Resp (!) 22   Ht 6' (1.829 m)   Wt 92.2 kg   SpO2 90%   BMI 27.57 kg/m   Awake, alert, oriented  Remains somewhat confused Mild left facial and partial right ptosis remain 5/5 RUE/RLE 4/5 LUE 2/5 distal LLE, minimal proximal strength  IMPRESSION:  69 y.o. male SAH d# 16, stable symptomatic spasm off pressors.  PLAN: - Will plan on angio likely tomorrow. If negative, would be ok for CIR - Cont Nimotop - Plan on last TCD today - Cont PT/OT/SLP

## 2020-01-31 ENCOUNTER — Inpatient Hospital Stay (HOSPITAL_COMMUNITY): Payer: BC Managed Care – PPO

## 2020-01-31 HISTORY — PX: IR ANGIO VERTEBRAL SEL VERTEBRAL BILAT MOD SED: IMG5369

## 2020-01-31 HISTORY — PX: IR ANGIO INTRA EXTRACRAN SEL INTERNAL CAROTID BILAT MOD SED: IMG5363

## 2020-01-31 LAB — GLUCOSE, CAPILLARY
Glucose-Capillary: 109 mg/dL — ABNORMAL HIGH (ref 70–99)
Glucose-Capillary: 121 mg/dL — ABNORMAL HIGH (ref 70–99)
Glucose-Capillary: 103 mg/dL — ABNORMAL HIGH (ref 70–99)
Glucose-Capillary: 124 mg/dL — ABNORMAL HIGH (ref 70–99)
Glucose-Capillary: 258 mg/dL — ABNORMAL HIGH (ref 70–99)

## 2020-01-31 IMAGING — XA IR CAROTID INTERNAL HEAD/NECK BILAT  (MS)
7 of 8 series · 12 of 24 positions shown · IV contrast (IODINE)
Comparison: none

PROCEDURE:
DIAGNOSTIC CEREBRAL ANGIOGRAM
HISTORY: The patient is a 68-year-old and hospital with subarachnoid
hemorrhage. Previous angiograms were negative for saccular aneurysm,
with severe vasospasm seen on most recent angiogram. The patient has
been monitored in the intensive care unit with stable neurologic
exam. He presents today for follow-up diagnostic cerebral angiogram
to assess both status of vasospasm and source for subarachnoid
hemorrhage.
TECHNIQUE: CATHETERS AND WIRES
5-French JB-1 catheter

[Series 1: cerebral care 2 · 2 acquisitions, 1 frame shown (1 of 6)]
[im 1/2]
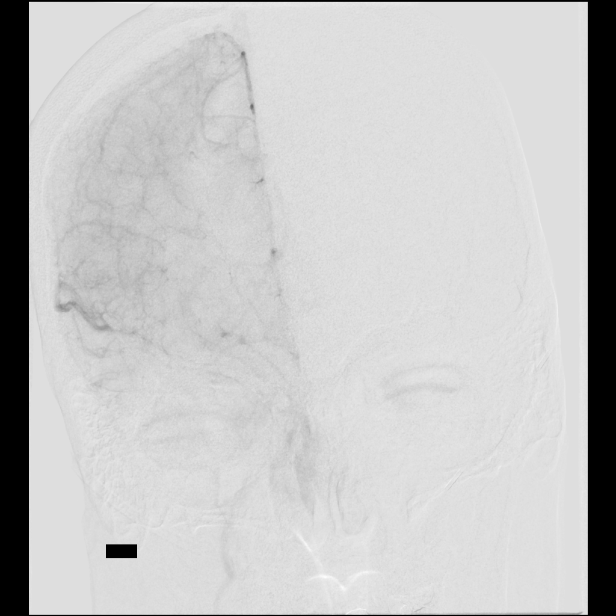

[Series 3: cerebral care 2 · 2 acquisitions, 2 frames shown (2 of 6)]
[im 1/2  full-range]
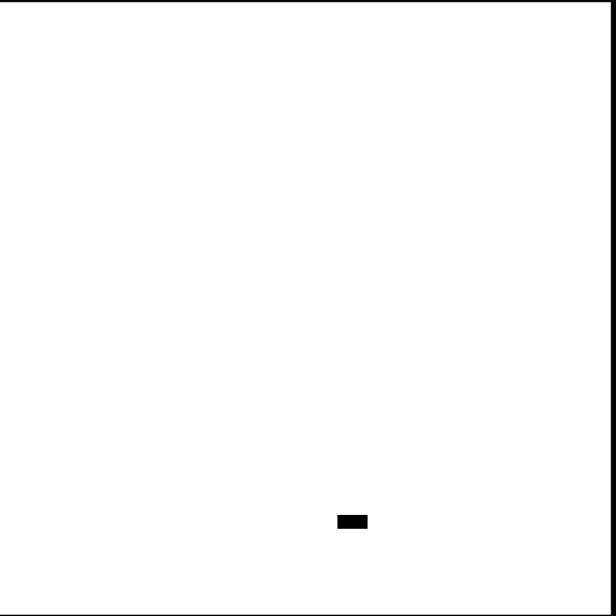
[im 2/2]
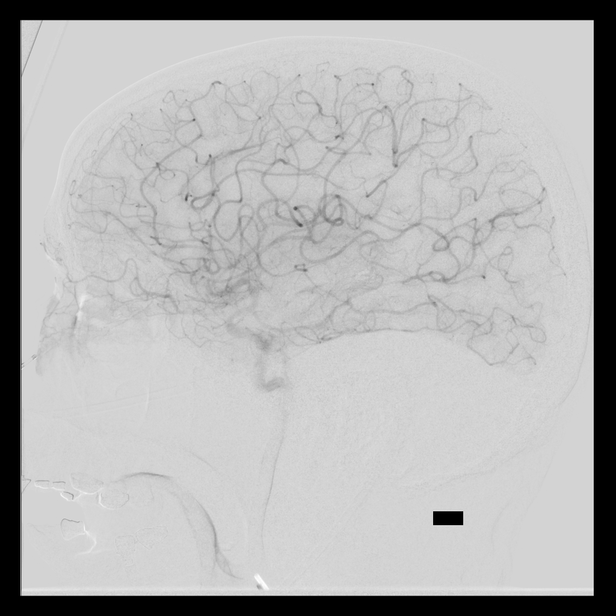

[Series 4: cerebral care 2 · 2 acquisitions, 2 frames shown (3 of 6)]
[im 1/2]
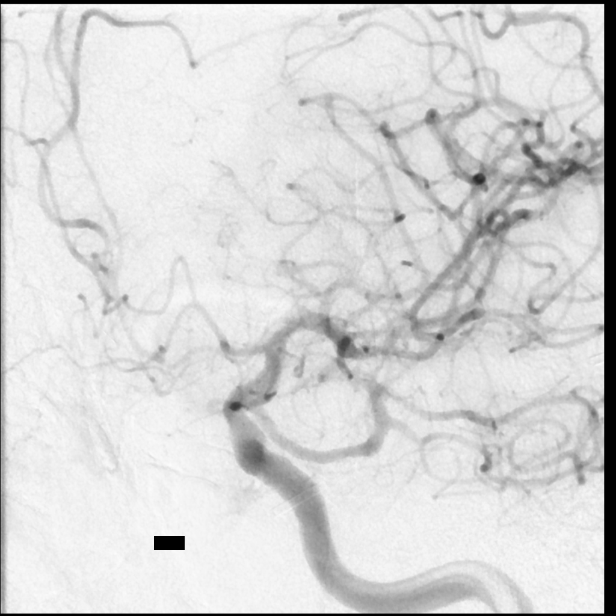
[im 2/2]
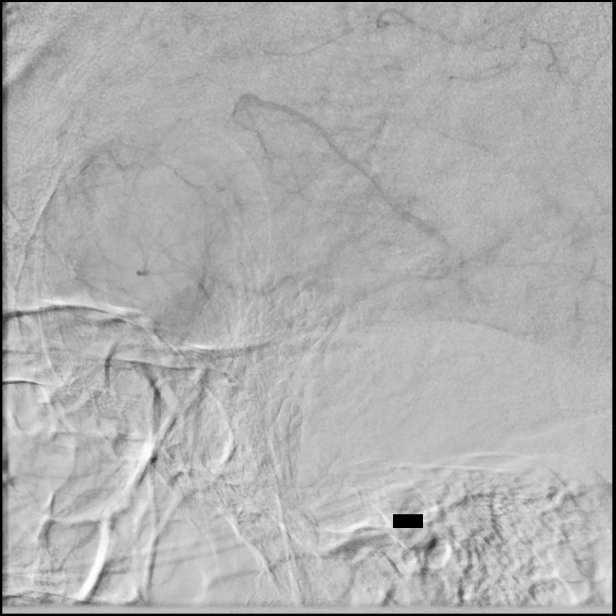

[Series 5: cerebral care 2 · 2 acquisitions, 1 frame shown (4 of 6)]
[im 2/2]
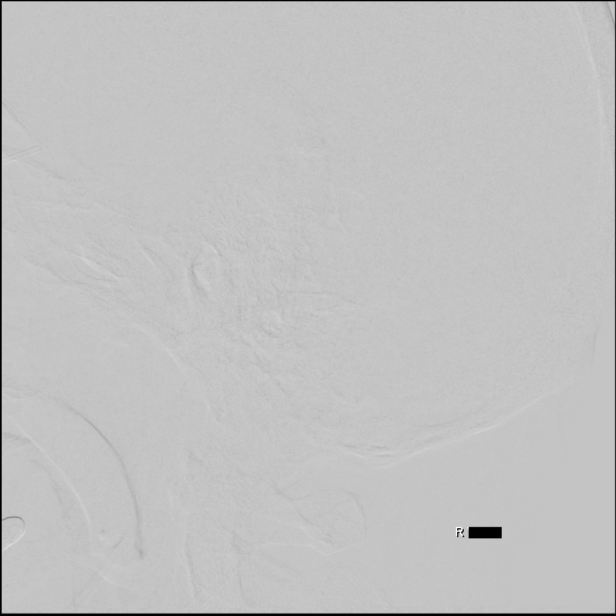

[Series 6: cerebral care 2 · 2 acquisitions, 2 frames shown (5 of 6)]
[im 1/2]
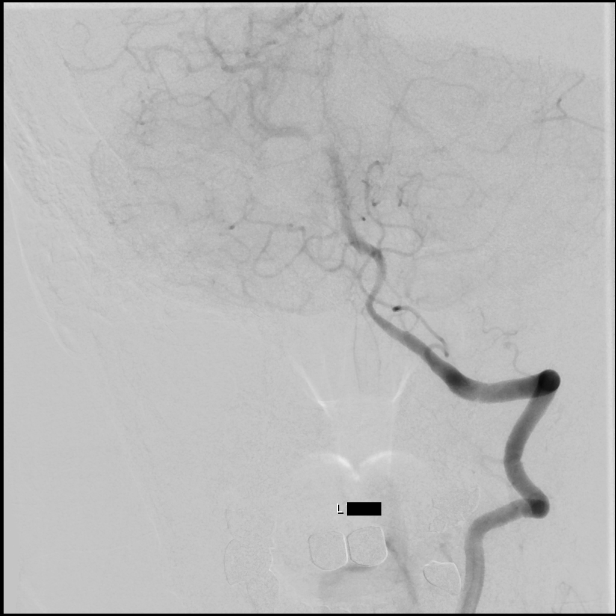
[im 2/2]
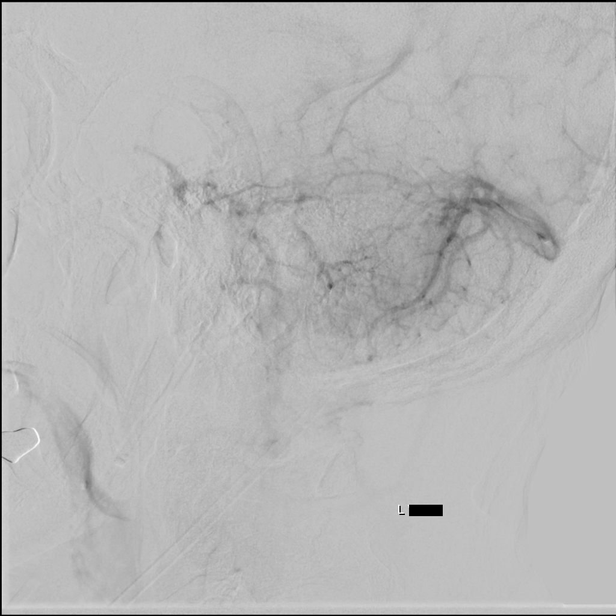

[Series 7: cerebral care 2 · 2 acquisitions, 1 frame shown (6 of 6)]
[im 2/2]
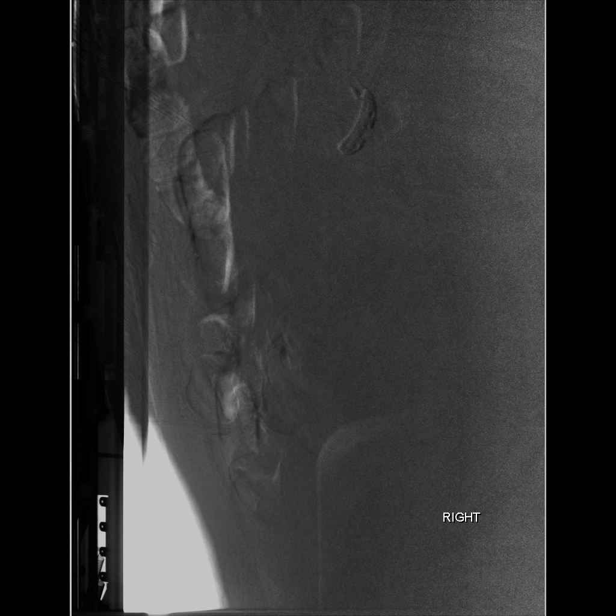

[Series 300: dr. (person_name) · 3 of 14 slices shown]
[im 2/14]
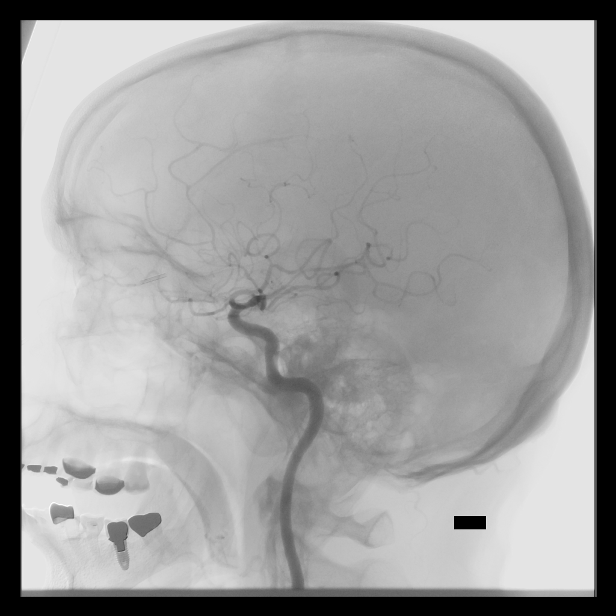
[im 8/14]
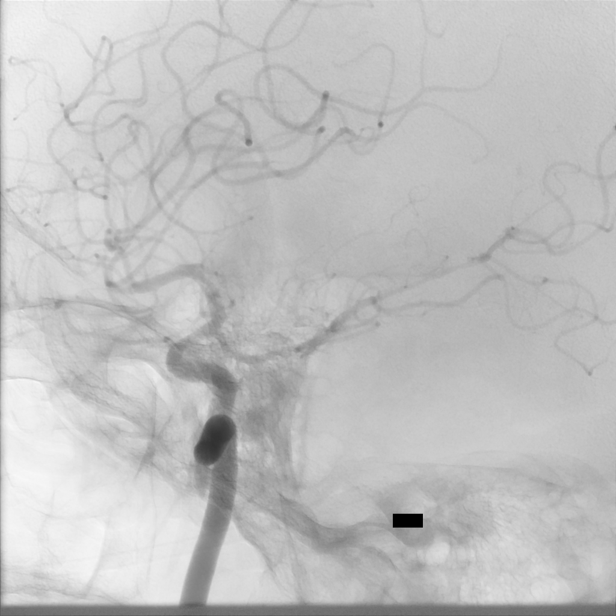
[im 14/14]
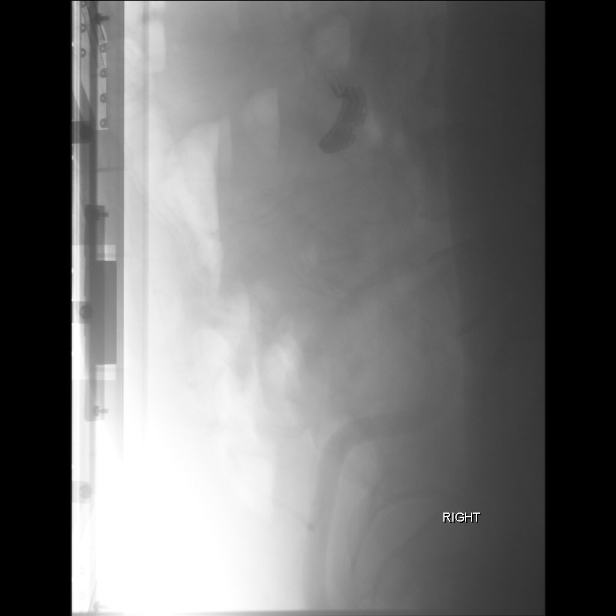

[12 of 24 positions shown; findings below may reference images not displayed]

ACCESS:
The technical aspects of the procedure as well as its potential
risks and benefits were reviewed with the patient's family. These
risks included but were not limited bleeding, infection, allergic
reaction, damage to organs or vital structures, stroke,
non-diagnostic procedure, and the catastrophic outcomes of heart
attack, coma, and death. With an understanding of these risks,
informed consent was obtained and witnessed. The patient was placed
in the supine position on the angiography table and the skin of
right groin prepped in the usual sterile fashion.

The procedure was performed under local anesthesia (1%-solution of
bicarbonate-buffered Lidocaine) and conscious sedation with
[LP] fentanyl monitored by myself and the in-suite nurse
using continuous pulse-oximetry, heart rate, and non-invasive
blood-pressure.

A 5- French sheath was introduced in the right common femoral artery
using Seldinger technique. A fluoro-phase sequence was used to
document the sheath position.

MEDICATIONS:
HEPARIN: 0 Units total.

CONTRAST:  cc, Omnipaque 300

FLUOROSCOPY TIME:  FLUOROSCOPY TIME: See IR records
0.035" glidewire

VESSELS CATHETERIZED
Right internal carotid

Left internal carotid

Left vertebral

Right vertebral

Right common femoral

VESSELS STUDIED
Right internal carotid, head

Left internal carotid, head

Left vertebral

Right vertebral

Right femoral

PROCEDURAL NARRATIVE
A 5-Fr JB-1 catheter was advanced over a 0.035 glidewire into the
aortic arch. The above vessels were then sequentially catheterized
and cervical / cerebral angiograms taken. After review of images,
the catheter was removed without incident.
FINDINGS: Right internal carotid, head:

Injection reveals the presence of a patent ICA, M1, and A1 segments
and their branches. The right A1 is again noted to be somewhat small
in caliber. Overall, degree of vasospasm the supraclinoid internal
carotid artery is significant in comparison to the prior angiogram.
No saccular aneurysms, arteriovenous malformations, or fistulas are
seen. There is non uniform opacification of the supraclinoid segment
of the ICA with suggestion of possible dissection flap. Nonetheless,
there is no significant flow limitation. The parenchymal and venous
phases are normal. The venous sinuses are widely patent.

Left internal carotid, head:

Injection reveals the presence of a widely patent ICA, A1, and M1
segments and their branches. There is moderate spasm involving the
supraclinoid segment of the left internal carotid artery without
flow limitation. No aneurysms, AVMs, or high-flow fistulas are seen.
The parenchymal and venous phases are normal. The venous sinuses are
widely patent.

Left vertebral:

Injection reveals the presence of a widely patent vertebral artery.
This leads to a widely patent basilar artery that terminates in
right P1. The basilar apex is normal. No aneurysms, AVMs, or
high-flow fistulas are seen. Severe vasospasm of the basilar artery
on the previous angiogram has largely resolved. The parenchymal and
venous phases are normal. The venous sinuses are widely patent.

Right vertebral:

The vertebral artery is patent with mild to moderate spasm involving
the distal portion of the right V4 segment. No PICA aneurysm is
seen. See basilar description above.

Right femoral:

Normal vessel. No significant atherosclerotic disease. Arterial
sheath in adequate position.

DISPOSITION:
Upon completion of the study, the femoral sheath was removed and
hemostasis obtained using a 5-Fr ExoSeal closure device. Good
proximal and distal lower extremity pulses were documented upon
achievement of hemostasis. The procedure was well tolerated and no
early complications were observed. The patient was transferred to
the holding area to lay flat for 2 hours.
IMPRESSION: 1. No saccular aneurysms, arteriovenous malformations, or high-flow
fistulas are identified. There is decreased opacification of the
supraclinoid segment of the right internal carotid artery which may
suggest the presence of a dissection, and pseudoaneurysm of the
source of hemorrhage. There is no significant flow limitation.

2. Significant overall improvement in the degree of intracranial
vasospasm in comparison to the prior angiogram.

The preliminary results of this procedure were shared with the
patient and the patient's family.

## 2020-01-31 MED ORDER — NIMODIPINE 6 MG/ML PO SOLN
30.0000 mg | ORAL | Status: DC
  Administered 2020-01-31 (×3): 30 mg via ORAL

## 2020-01-31 MED ORDER — IOHEXOL 300 MG/ML  SOLN
100.0000 mL | Freq: Once | INTRAMUSCULAR | Status: AC | PRN
  Administered 2020-01-31: 50 mL via INTRA_ARTERIAL

## 2020-01-31 MED ORDER — POTASSIUM CHLORIDE IN NACL 20-0.9 MEQ/L-% IV SOLN
INTRAVENOUS | Status: DC
  Filled 2020-01-31 (×4): qty 1000

## 2020-01-31 MED ORDER — MIDAZOLAM HCL 2 MG/2ML IJ SOLN
INTRAMUSCULAR | Status: AC
  Filled 2020-01-31: qty 2

## 2020-01-31 MED ORDER — FENTANYL CITRATE (PF) 100 MCG/2ML IJ SOLN
INTRAMUSCULAR | Status: AC
  Filled 2020-01-31: qty 2

## 2020-01-31 MED ORDER — ASPIRIN 325 MG PO TABS
325.0000 mg | ORAL_TABLET | Freq: Every day | ORAL | Status: DC
  Administered 2020-01-31: 325 mg via ORAL
  Filled 2020-01-31 (×2): qty 1

## 2020-01-31 MED ORDER — INSULIN ASPART 100 UNIT/ML ~~LOC~~ SOLN
0.0000 [IU] | Freq: Three times a day (TID) | SUBCUTANEOUS | Status: DC
  Administered 2020-01-31: 3 [IU] via SUBCUTANEOUS
  Administered 2020-02-01: 2 [IU] via SUBCUTANEOUS
  Administered 2020-02-01: 1 [IU] via SUBCUTANEOUS
  Administered 2020-02-02: 2 [IU] via SUBCUTANEOUS

## 2020-01-31 MED ORDER — NIMODIPINE 30 MG PO CAPS
60.0000 mg | ORAL_CAPSULE | ORAL | Status: DC
  Filled 2020-01-31: qty 2

## 2020-01-31 MED ORDER — NIMODIPINE 6 MG/ML PO SOLN
60.0000 mg | ORAL | Status: DC
  Administered 2020-01-31 – 2020-02-02 (×4): 60 mg via ORAL
  Filled 2020-01-31 (×3): qty 10

## 2020-01-31 MED ORDER — FENTANYL CITRATE (PF) 100 MCG/2ML IJ SOLN
INTRAMUSCULAR | Status: DC | PRN
  Administered 2020-01-31: 12.5 ug via INTRAVENOUS

## 2020-01-31 MED ORDER — NIMODIPINE 30 MG PO CAPS
60.0000 mg | ORAL_CAPSULE | ORAL | Status: DC
  Administered 2020-02-01 (×2): 60 mg via ORAL
  Filled 2020-01-31 (×2): qty 2

## 2020-01-31 MED ORDER — HEPARIN SODIUM (PORCINE) 1000 UNIT/ML IJ SOLN
INTRAMUSCULAR | Status: AC
  Filled 2020-01-31: qty 1

## 2020-01-31 MED ORDER — LIDOCAINE HCL 1 % IJ SOLN
INTRAMUSCULAR | Status: AC
  Filled 2020-01-31: qty 20

## 2020-01-31 MED ORDER — LIDOCAINE HCL 1 % IJ SOLN
INTRAMUSCULAR | Status: DC | PRN
  Administered 2020-01-31: 10 mL

## 2020-01-31 MED ORDER — FENOFIBRATE 160 MG PO TABS
160.0000 mg | ORAL_TABLET | Freq: Every day | ORAL | Status: DC
  Filled 2020-01-31 (×3): qty 1

## 2020-01-31 NOTE — Progress Notes (Signed)
PT Cancellation Note  Patient Details Name: Nicholas [name] MRN: [id number] DOB: 11-10-1950   Cancelled Treatment:    Reason Eval/Treat Not Completed: Patient at procedure or test/unavailable; patient going down for angio.  Will attempt later as time permits.   Elray Mcgregor 01/31/2020, 12:21 PM  Sheran Lawless, PT Acute Rehabilitation Services [phone] 01/31/2020

## 2020-01-31 NOTE — Progress Notes (Signed)
I have previously reviewed the need for another angiogram to identify a cause for [facility] with both the patient's son and wife. We have also reviewed the risks of the procedure. Both the patient's wife and son have agreed to proceed.

## 2020-01-31 NOTE — Progress Notes (Signed)
SLP Cancellation Note  Patient Details Name: Nicholas [name] MRN: [id number] DOB: [birth year]   Cancelled treatment:       Reason Eval/Treat Not Completed: Patient at procedure or test/unavailable.  [name], MA CCC/SLP Acute Rehabilitation Services Office number [phone] Pager [phone]    [name] 01/31/2020, 1:22 PM

## 2020-01-31 NOTE — Progress Notes (Signed)
Patient returned from IR to 4N15,  Groin site level 0 and pulses palpable.  Groin site marked at 1334.

## 2020-01-31 NOTE — Sedation Documentation (Signed)
Direct pressure held at groin site x 10 min

## 2020-01-31 NOTE — Progress Notes (Signed)
Inpatient Rehabilitation-Admissions Coordinator   Met with pt and his wife bedside as follow up from PM&R consult completed by Dr. Delice Lesch on 01/25/20. Discussed recommended CIR program and reviewed estimated LOS, anticipated support needed at DC and expectations for therapy. Pt and his wife would like to pursue this program once medically ready and if insurance approves. AC will begin insurance authorization process for possible admit. Will follow up once there has been a determination.   Raechel Ache, OTR/L  Rehab Admissions Coordinator  [phone] 01/31/2020 2:34 PM

## 2020-01-31 NOTE — Progress Notes (Addendum)
  NEUROSURGERY PROGRESS NOTE   No issues overnight. No concerns this am  EXAM:  BP (!) 161/76 (BP Location: Left Arm)   Pulse 86   Temp 98.2 F (36.8 C) (Axillary)   Resp 18   Ht 6' (1.829 m)   Wt 92.2 kg   SpO2 93%   BMI 27.57 kg/m   Awake, alert, oriented although intermittent confusion Mild left facial and partial right ptosis  5/5 RUE/RLE, 4/5 LUE 2/5 distal LLE, minimal proximal strength  IMPRESSION/PLAN 69 y.o. male SAH d# 16, stable symptomatic spasm off pressors. - Angio today. NPO.

## 2020-01-31 NOTE — Brief Op Note (Signed)
  NEUROSURGERY BRIEF OPERATIVE  NOTE   PREOP DX: Subarachnoid Hemorrhage  POSTOP DX: Same  PROCEDURE: Diagnostic cerebral angiogram  SURGEON: Dr. Lisbeth Renshaw, MD  ANESTHESIA: IV Sedation with Local  EBL: Minimal  SPECIMENS: None  COMPLICATIONS: None  CONDITION: Stable to recovery  FINDINGS (Full report in [facility]): 1. No saccular aneurysms, AVM, or fistulas seen however there is non-uniform opacification of the supraclinoid segment of the right ICA suggestive of possible dissection and pseudoaneurysm as the source of SAH. There is no flow limitation. 2. Improved vasospasm of bilateral ICA and Basilar artery, with moderate spasm of the LICA persisting without flow limitation.

## 2020-01-31 NOTE — Progress Notes (Signed)
PROGRESS NOTE    Nicholas [name]   OYD:[id number]  DOB: 1950-10-17  DOA: 01/14/2020 PCP: Gillian Scarce, MD   Brief Narrative:  Nicholas [name] is a 69 year old male with a past medical history diabetes mellitus, hyperlipidemia, hypertension and migraine headaches who presented on 4/26 for a severe headache, nausea and vomiting and was admitted by neurosurgery for subarachnoid hemorrhage suspected to be due to an aneurysm rupture. Patient has had an extensive ICU course and has been transitioned to [facility] to continue to manage as consultants.   MRI head 5/1 - Multiple small acute infarcts the right cerebral hemisphere primarily involving the anterior greater than posterior circulations. Thin left larger than right subdural collections with stable mild rightward midline shift. Well is that 4/30 > Diagnostic cerebral angiogram with balloon angioplasty 4/30 CVC triple lumen catheter (left subclavian) 01/18/20 A-line 4/30 >> 5/1 5/4 > Remains on two vasopressors for escalated map goal per neuro/neurosx 5/5 wide-complex tachycardia runs, worsening hypotension with Lopressor, started on amio  Subjective: He has no complaints for me. We have discussed his poor appetite and I have encouraged him to improve oral intake.   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   NSVT (nonsustained ventricular tachycardia)   -Cardiology following-patient is on oral amiodarone-Per cardiology,  okay to stop in 4 wks and okay to DC to CIR -Avoiding anticoagulation due to subarachnoid hemorrhage  Dyslipidemia    Ref. Range 01/19/2020 04:56  Total CHOL/HDL Ratio Latest Units: RATIO 3.4  Cholesterol Latest Ref Range: 0 - 200 mg/dL 88  HDL Cholesterol Latest Ref Range: >40 mg/dL 26 (L)  LDL (calc) Latest Ref Range: 0 - 99 mg/dL 37  Triglycerides Latest Ref Range: <150 mg/dL 672  VLDL Latest Ref Range: 0 - 40 mg/dL 25   - lipids appear to be quite well controlled on Lipitor, Fenofibrate and Lovaza at home - currently on Crestor - OK to resume Fenofibrate on discharge per neuro- will go ahead and resume today   DM, insulin requiring - A1c is 7.7 - home meds listed as > Novolog, Trisiba, Levemir (PRN), Xigduo, Glipizide, Actos and Janumet - currently receiving Levemir, Novolog and ISS Q 4 hrs - 5/12> as he had hypoglycemia twice today and his sugars have been quite normal, I will cut back his Levemir from 35 to 28 U and stop his mealtime insulin -  - I will watch his sugars and resume insulin once I see how much he is eating and how high his sugars are- today he is NPO for the angiogram- sugars well controlled w/o hypoglycemia - will place on a very low dose ISS but hold off on additional short acting Novolog  Poor appetite - cont to encourage oral intake- consider appetite stimulant if no improvement - agree with IVF for now     Essential hypertension - home med, lisinopril on hold- cont Nimodipine per primary  Vit D deficiency - cont Vit D which is a home med    Time spent in minutes: 35 DVT  prophylaxis:  SCDs Code Status: Full code Family Communication: wife  Consultants:   [facility]  Pulmonary critical care  Neurology/stroke team  Cardiology  Palliative care    Procedures: 2 d Echo 4/27 1. Normal LV systolic function; proximal septal thickening; grade 1  diastolic dysfunction; mildly dilated aortic root; scerotic aortic valve.  2. Left ventricular ejection  fraction, by estimation, is 70 to 75%. The  left ventricle has hyperdynamic function. The left ventricle has no  regional wall motion abnormalities. There is mild left ventricular  hypertrophy of the basal segment. Left  ventricular diastolic parameters are consistent with Grade I diastolic  dysfunction (impaired relaxation).  3. Right ventricular systolic function is normal. The right ventricular  size is normal.  4. The mitral valve is normal in structure. No evidence of mitral valve  regurgitation. No evidence of mitral stenosis.  5. The aortic valve is tricuspid. Aortic valve regurgitation is not  visualized. Mild to moderate aortic valve sclerosis/calcification is  present, without any evidence of aortic stenosis.  6. Aortic dilatation noted. There is mild dilatation of the aortic root  measuring 38 mm.  7. The inferior vena cava is normal in size with greater than 50%  respiratory variability, suggesting right atrial pressure of 3 mmHg.   Antimicrobials:  Anti-infectives (From admission, onward)   Start     Dose/Rate Route Frequency Ordered Stop   01/15/20 1200  vancomycin (VANCOREADY) IVPB 2000 mg/400 mL  Status:  Discontinued     2,000 mg 200 mL/hr over 120 Minutes Intravenous Every 24 hours 01/14/20 1125 01/14/20 1351   01/14/20 2200  ceFEPIme (MAXIPIME) 2 g in sodium chloride 0.9 % 100 mL IVPB  Status:  Discontinued     2 g 200 mL/hr over 30 Minutes Intravenous Every 8 hours 01/14/20 1122 01/14/20 1840   01/14/20 1500  metroNIDAZOLE (FLAGYL) IVPB 500 mg  Status:  Discontinued     500 mg 100 mL/hr over 60 Minutes Intravenous  Once 01/14/20 1352 01/14/20 1840   01/14/20 1430  vancomycin (VANCOREADY) IVPB 2000 mg/400 mL  Status:  Discontinued     2,000 mg 200 mL/hr over 120 Minutes Intravenous Every 24 hours 01/14/20 1351 01/14/20 1840   01/14/20 1130  vancomycin (VANCOCIN) IVPB 1000 mg/200 mL premix  Status:  Discontinued     1,000 mg 200 mL/hr over 60 Minutes  Intravenous  Once 01/14/20 1125 01/14/20 1351   01/14/20 1112  sodium chloride 0.9 % with ceFEPIme (MAXIPIME) ADS Med  Status:  Discontinued    Note to Pharmacy: Marinus Maw   : cabinet override      01/14/20 1112 01/14/20 1950   01/14/20 1100  ceFEPIme (MAXIPIME) 2 g in sodium chloride 0.9 % 100 mL IVPB     2 g 200 mL/hr over 30 Minutes Intravenous  Once 01/14/20 1050 01/14/20 1439   01/14/20 1100  metroNIDAZOLE (FLAGYL) IVPB 500 mg  Status:  Discontinued     500 mg 100 mL/hr over 60 Minutes Intravenous  Once 01/14/20 1050 01/14/20 1352   01/14/20 1100  vancomycin (VANCOCIN) IVPB 1000 mg/200 mL premix  Status:  Discontinued     1,000 mg 200 mL/hr over 60 Minutes Intravenous  Once 01/14/20 1050 01/14/20 1351       Objective: Vitals:   01/31/20 0700 01/31/20 0800 01/31/20 0900 01/31/20 1000  BP: (!) 157/69 (!) 161/76  (!) 160/80  Pulse: 89 86 79 79  Resp: (!) 21 18  18   Temp:  98.2 F (36.8 C)  TempSrc:  Oral    SpO2: 90% 93% 90% 90%  Weight:      Height:        Intake/Output Summary (Last 24 hours) at 01/31/2020 1057 Last data filed at 01/31/2020 1000 Gross per 24 hour  Intake 1825.41 ml  Output 5125 ml  Net -3299.59 ml   Filed Weights   01/21/20 0500 01/22/20 0500 01/28/20 0332  Weight: 90.3 kg 96.5 kg 92.2 kg    Examination: General exam: Appears comfortable  HEENT: PERRLA, oral mucosa dry, no sclera icterus or thrush Respiratory system: Clear to auscultation. Respiratory effort normal. Cardiovascular system: S1 & S2 heard,  No murmurs  Gastrointestinal system: Abdomen soft, non-tender, nondistended. Normal bowel sounds   Central nervous system: Alert and oriented. Left leg 0/51m, left arm 4/5 Extremities: No cyanosis, clubbing or edema Skin: No rashes or ulcers Psychiatry:  flat affect       Data Reviewed: I have personally reviewed following labs and imaging studies  CBC: Recent Labs  Lab 01/25/20 0544 01/26/20 0415 01/28/20 0858 01/29/20 0648  01/30/20 0450  WBC 26.2* 23.1* 19.5* 14.3* 11.7*  NEUTROABS 23.5* 20.6*  --   --   --   HGB 12.0* 11.9* 10.8* 10.9* 10.9*  HCT 36.0* 35.3* 32.2* 33.1* 32.9*  MCV 86.1 86.5 87.3 87.1 87.7  PLT 128* 169 262 277 316   Basic Metabolic Panel: Recent Labs  Lab 01/25/20 0544 01/25/20 1503 01/25/20 2230 01/25/20 2230 01/26/20 0415 01/26/20 0415 01/26/20 1421 01/27/20 0600 01/28/20 0858 01/29/20 0648 01/30/20 0450  NA 135   < > 133*   < > 133*   < > 132* 133* 134* 138 135  K 3.2*   < > 3.5   < > 3.5   < > 3.5 3.8 3.6 3.5 3.8  CL 97*   < > 94*   < > 94*   < > 95* 96* 97* 98 97*  CO2 25   < > 28   < > 27   < > 28 27 27 28 29   GLUCOSE 130*   < > 178*   < > 185*   < > 125* 158* 122* 72 151*  BUN 13   < > 9   < > 10   < > 8 11 11 10 9   CREATININE 0.65   < > 0.62   < > 0.67   < > 0.57* 0.56* 0.64 0.64 0.70  CALCIUM 8.4*   < > 8.0*   < > 8.0*   < > 8.0* 8.2* 8.5* 8.7* 8.4*  MG 1.7  --  1.8  --  2.1  --   --  1.9  --  1.7 1.9  PHOS 2.7  --   --   --   --   --   --   --   --  4.0 3.6   < > = values in this interval not displayed.   GFR: Estimated Creatinine Clearance: 99.7 mL/min (by C-G formula based on SCr of 0.7 mg/dL). Liver Function Tests: No results for input(s): AST, ALT, ALKPHOS, BILITOT, PROT, ALBUMIN in the last 168 hours. No results for input(s): LIPASE, AMYLASE in the last 168 hours. No results for input(s): AMMONIA in the last 168 hours. Coagulation Profile: No results for input(s): INR, PROTIME in the last 168 hours. Cardiac Enzymes: No results for input(s): CKTOTAL, CKMB, CKMBINDEX, TROPONINI in the last 168 hours. BNP (last 3 results) No results for input(s): PROBNP in the last 8760  hours. HbA1C: No results for input(s): HGBA1C in the last 72 hours. CBG: Recent Labs  Lab 01/30/20 1730 01/30/20 1925 01/30/20 2317 01/31/20 0305 01/31/20 0737  GLUCAP 177* 149* 125* 109* 121*   Lipid Profile: No results for input(s): CHOL, HDL, LDLCALC, TRIG, CHOLHDL, LDLDIRECT  in the last 72 hours. Thyroid Function Tests: No results for input(s): TSH, T4TOTAL, FREET4, T3FREE, THYROIDAB in the last 72 hours. Anemia Panel: No results for input(s): VITAMINB12, FOLATE, FERRITIN, TIBC, IRON, RETICCTPCT in the last 72 hours. Urine analysis:    Component Value Date/Time   COLORURINE STRAW (A) 01/19/2020 1058   APPEARANCEUR CLEAR 01/19/2020 1058   LABSPEC 1.008 01/19/2020 1058   PHURINE 7.0 01/19/2020 1058   GLUCOSEU >=500 (A) 01/19/2020 1058   HGBUR NEGATIVE 01/19/2020 1058   BILIRUBINUR NEGATIVE 01/19/2020 1058   KETONESUR NEGATIVE 01/19/2020 1058   PROTEINUR 30 (A) 01/19/2020 1058   NITRITE NEGATIVE 01/19/2020 1058   LEUKOCYTESUR NEGATIVE 01/19/2020 1058   Sepsis Labs: @LABRCNTIP (procalcitonin:4,lacticidven:4) )No results found for this or any previous visit (from the past 240 hour(s)).       Radiology Studies: DG CHEST PORT 1 VIEW  Result Date: 01/30/2020 CLINICAL DATA:  Respiratory failure. EXAM: PORTABLE CHEST 1 VIEW COMPARISON:  Chest x-ray dated Jan 28, 2020. FINDINGS: Interval removal of the left subclavian central venous catheter. Stable cardiomediastinal silhouette. Unchanged small left pleural effusion with adjacent left lower lobe opacity. The right lung is clear. No pneumothorax. No acute osseous abnormality. IMPRESSION: 1. Unchanged small left pleural effusion with adjacent left lower lobe atelectasis versus infiltrate. Electronically Signed   By: Obie DredgeWilliam T Derry M.D.   On: 01/30/2020 07:54   VAS US TRANSCRANIAL DOPPLER  Result Date: 01/30/2020  Transcranial Doppler Indications: Subarachnoid hemorrhage. Limitations: Patient positioning, patient movement Limitations for diagnostic windows: Unable to insonate left transtemporal window. Unable to insonate foramen window. Comparison Study: 01/28/2020 Performing Technologist: Chanda BusingGregory Collins RVT  Examination Guidelines: A complete evaluation includes B-mode imaging, spectral Doppler, color Doppler, and  power Doppler as needed of all accessible portions of each vessel. Bilateral testing is considered an integral part of a complete examination. Limited examinations for reoccurring indications may be performed as noted.  +----------+-------------+----------+-----------+-------+ RIGHT TCD Right VM (cm)Depth (cm)PulsatilityComment +----------+-------------+----------+-----------+-------+ MCA           99.00                 1.35            +----------+-------------+----------+-----------+-------+ ACA          -24.00                 1.69            +----------+-------------+----------+-----------+-------+ Term ICA     122.00                  1.3            +----------+-------------+----------+-----------+-------+ PCA           34.00                 1.55            +----------+-------------+----------+-----------+-------+ Opthalmic     34.00                 2.23            +----------+-------------+----------+-----------+-------+ ICA siphon    72.00                 1.36            +----------+-------------+----------+-----------+-------+  +----------+------------+----------+-----------+-------+  LEFT TCD  Left VM (cm)Depth (cm)PulsatilityComment +----------+------------+----------+-----------+-------+ MCA          47.00                 1.29            +----------+------------+----------+-----------+-------+ PCA          22.00                 2.02            +----------+------------+----------+-----------+-------+ Opthalmic    24.00                 2.14            +----------+------------+----------+-----------+-------+ ICA siphon   45.00                 1.69            +----------+------------+----------+-----------+-------+  Summary:  Elevated right middle cerebral and right terminal carotid artery mean flow velocities suggest mild vasospasm but improved from last study.Normal mean flow velocities in remaining identified vessels of anterior and  posterior cerebral circulations. *See table(s) above for TCD measurements and observations.  Diagnosing physician: Delia Heady MD Electronically signed by Delia Heady MD on 01/30/2020 at 4:02:18 PM.    Final       Scheduled Meds: . amiodarone  200 mg Oral Daily  . bisacodyl  10 mg Oral Q0600  . Chlorhexidine Gluconate Cloth  6 each Topical Daily  . cholecalciferol  1,000 Units Oral Daily  . enoxaparin (LOVENOX) injection  40 mg Subcutaneous Q24H  . insulin detemir  28 Units Subcutaneous BID  . levETIRAcetam  500 mg Oral BID  . magnesium hydroxide  30 mL Oral Daily  . niMODipine  60 mg Oral Q4H   Or  . niMODipine  30 mg Oral Q4H  . rosuvastatin  40 mg Oral Daily  . senna-docusate  1 tablet Oral BID   Continuous Infusions: . sodium chloride 0 mL/hr at 01/14/20 1126  . 0.9 % NaCl with KCl 20 mEq / L 75 mL/hr at 01/31/20 0700     LOS: 17 days      Calvert Cantor, MD [facility] Pager: [email or web address] 01/31/2020, 10:57 AM

## 2020-02-01 ENCOUNTER — Inpatient Hospital Stay (HOSPITAL_COMMUNITY): Payer: BC Managed Care – PPO

## 2020-02-01 DIAGNOSIS — I609 Nontraumatic subarachnoid hemorrhage, unspecified: Secondary | ICD-10-CM

## 2020-02-01 LAB — GLUCOSE, CAPILLARY
Glucose-Capillary: 222 mg/dL — ABNORMAL HIGH (ref 70–99)
Glucose-Capillary: 182 mg/dL — ABNORMAL HIGH (ref 70–99)
Glucose-Capillary: 161 mg/dL — ABNORMAL HIGH (ref 70–99)
Glucose-Capillary: 87 mg/dL (ref 70–99)

## 2020-02-01 MED ORDER — INSULIN DETEMIR 100 UNIT/ML ~~LOC~~ SOLN
30.0000 [IU] | Freq: Two times a day (BID) | SUBCUTANEOUS | Status: DC
  Administered 2020-02-01 – 2020-02-02 (×2): 30 [IU] via SUBCUTANEOUS
  Filled 2020-02-01 (×4): qty 0.3

## 2020-02-01 NOTE — PMR Pre-admission (Signed)
PMR Admission Coordinator Pre-Admission Assessment  Patient: Nicholas [name] is an 69 y.o., male MRN: [id number] DOB: 02-Aug-1951 Height: 6' (182.9 cm) Weight: 92.2 kg              Insurance Information HMO:     PPO: yes     PCP:      IPA:      80/20:      OTHER:  PRIMARY: [employer] with [employer] via [employer]      Policy#: [id number]      Subscriber: patient CM Name: faxed approval from clinical services      Phone#: [phone] (option 2)     Fax#: [phone] Pre-Cert#: ([id number]      Employer:  [employer] provided by Clinical services Fax with approval for CIR. Inpatient rehab date of service: 02/01/2020 - 02/07/2120 (7 days) with LCD: 02/07/2020 (p): [phone] (option 2); (f): [phone] Benefits:  Phone #: [employer] [phone]     Name:  Eff. Date: 09/20/2018-11/18/2019     Deduct: $1,350 ($1,350)      Out of Pocket Max: $2,700 ($2,700 met)      Life Max: NA  CIR: 90% coverage, 10% co-insurance (after deductible, 100% coverage)      SNF: 90% coverage, 10% co-insurance (after deductible, 100% coverage); 120 day limit Outpatient: 90% coverage, 10% co-insurance (after deductible, 100% coverage); limited to 60 visits     Home Health: 90% coverage, 10% co-insurance (after deductible, 100% coverage); limited to 100 visits      DME: 90% coverage, 10% co-insurance (after deductible, 100% coverage) Providers:  SECONDARY: Medicare Part A       Policy#: [id number]     Phone#: verified eligibility online via [employer] on 02/01/20  Financial Counselor:       Phone#:   The "Data Collection Information Summary" for patients in Inpatient Rehabilitation Facilities with attached "Privacy Act [place] Records" was provided and verbally reviewed with: Patient and Family  Emergency Contact Information Contact Information    Name Relation Home Work Mobile   [place] Spouse [phone]  [phone]     Current Medical History  Patient Admitting Diagnosis: Left > Right  subdural hematoma   History of Present Illness: Nicholas Mcmahill. [name] is a 69 year old male history of hypertension, diabetes mellitus, hepatitis, BPH.  Per chart review patient lives with wife independent prior to admission.  Presented 01/14/2020 with nausea and headache and left-sided weakness.  CT angiogram of head and neck showed a large volume acute subarachnoid hemorrhage.  Large amount of subarachnoid blood seen in the basilar cisterns and extending into the sylvian fissure bilaterally right greater than left.  Interhemispheric subarachnoid hemorrhage present.  Negative for hydrocephalus.  Patient underwent cerebral angiogram balloon angioplasty of right internal carotid artery right middle cerebral artery and balloon angioplasty of basilar artery.  Keppra initiated for seizure prophylaxis.  Echocardiogram with ejection fraction of 70% with grade 1 diastolic dysfunction.  Maintained on Nimotop  for blood pressure control and vasospasms times protocol 21 days.  Cardiology services consulted 01/23/2020 for wide-complex tachycardia/hypotension and placed on amiodarone 200 mg daily as well is cleared for aspirin therapy...  Latest follow-up MRI of the brain showed mildly increased left subdural hematoma since 01/19/2020 9 mm in thickness.  Smaller right side subdural hematoma has regressed.  Associated mildly increased rightward midline shift was 7 mm.  Presently patient has been cleared to initiate Lovenox for DVT prophylaxis..  Palliative care consulted for goals of care.  Tolerating a regular diet. Therapy evaluations completed  and patient is to be admitted for a comprehensive rehab program on 02/02/20.   Complete NIHSS TOTAL: 10 Glasgow Coma Scale Score: 14  Past Medical History  Past Medical History:  Diagnosis Date  . BPH (benign prostatic hypertrophy) 12/20/2011   Dr. Thomasene Mohair  . Colon polyp    Dr. Valli Glance, it was recommended that he have a follow-up colonoscopy in 2010.  Marland Kitchen Deviated septum   . Diabetes  mellitus without complication [facility])    Eye exam - 2011  . Hepatitis A   . HSV-1 (herpes simplex virus 1) infection   . Hyperlipidemia   . Hypertension   . Migraine headache     Family History  family history includes Cancer in his father; Diabetes in his brother, father, mother, and sister; Neuropathy in his mother.  Prior Rehab/Hospitalizations:  Has the patient had prior rehab or hospitalizations prior to admission? No  Has the patient had major surgery during 100 days prior to admission? Yes  Current Medications   Current Facility-Administered Medications:  .  0.9 %  sodium chloride infusion, , Intravenous, PRN, Maudie Flakes, MD, Last Rate: 0 mL/hr at 01/14/20 1126, New Bag at 01/18/20 1157 .  0.9 % NaCl with KCl 20 mEq/ L  infusion, , Intravenous, Continuous, Rizwan, Saima, MD, Last Rate: 75 mL/hr at 02/01/20 1200, Rate Verify at 02/01/20 1200 .  acetaminophen (TYLENOL) tablet 650 mg, 650 mg, Oral, Q4H PRN, 650 mg at 01/27/20 0956 **OR** acetaminophen (TYLENOL) 160 MG/5ML solution 650 mg, 650 mg, Per Tube, Q4H PRN, 650 mg at 01/20/20 2035 **OR** acetaminophen (TYLENOL) suppository 650 mg, 650 mg, Rectal, Q4H PRN, Kary Kos, MD .  aspirin tablet 325 mg, 325 mg, Oral, Daily, Consuella Lose, MD, 325 mg at 01/31/20 1446 .  bisacodyl (DULCOLAX) EC tablet 10 mg, 10 mg, Oral, Q0600, Merlene Laughter F, NP, 10 mg at 01/29/20 1029 .  bisacodyl (DULCOLAX) suppository 10 mg, 10 mg, Rectal, Daily PRN, Johnsie Cancel, NP .  Chlorhexidine Gluconate Cloth 2 % PADS 6 each, 6 each, Topical, Daily, Kary Kos, MD, 6 each at 02/01/20 1500 .  cholecalciferol (VITAMIN D3) tablet 1,000 Units, 1,000 Units, Oral, Daily, Kary Kos, MD, 1,000 Units at 01/30/20 0924 .  enoxaparin (LOVENOX) injection 40 mg, 40 mg, Subcutaneous, Q24H, Rigoberto Noel, MD, 40 mg at 02/01/20 1128 .  fenofibrate tablet 160 mg, 160 mg, Oral, Daily, Debbe Odea, MD .  fentaNYL (SUBLIMAZE) injection, , Intravenous, PRN,  Consuella Lose, MD, 12.5 mcg at 01/31/20 1308 .  HYDROmorphone (DILAUDID) injection 0.5 mg, 0.5 mg, Intravenous, Q2H PRN, Agarwala, Ravi, MD, 0.5 mg at 01/31/20 2208 .  insulin aspart (novoLOG) injection 0-6 Units, 0-6 Units, Subcutaneous, TID WC, Debbe Odea, MD, 2 Units at 02/01/20 0817 .  insulin detemir (LEVEMIR) injection 30 Units, 30 Units, Subcutaneous, BID, Debbe Odea, MD, 30 Units at 02/01/20 1127 .  levETIRAcetam (KEPPRA) 100 MG/ML solution 500 mg, 500 mg, Oral, BID, Consuella Lose, MD, 500 mg at 02/01/20 1035 .  lidocaine (XYLOCAINE) 1 % (with pres) injection, , Infiltration, PRN, Consuella Lose, MD, 10 mL at 01/31/20 1309 .  magnesium hydroxide (MILK OF MAGNESIA) suspension 30 mL, 30 mL, Oral, Daily, Candee Furbish, MD, 30 mL at 01/30/20 0923 .  methocarbamol (ROBAXIN) tablet 500 mg, 500 mg, Oral, Q8H PRN, Rigoberto Noel, MD, 500 mg at 02/01/20 1133 .  niMODipine (NIMOTOP) capsule 60 mg, 60 mg, Oral, Q4H **OR** niMODipine (NYMALIZE) 6 MG/ML oral solution 60 mg, 60 mg, Oral,  Delrae Sawyers, MD, 60 mg at 02/01/20 0356 .  oxyCODONE-acetaminophen (PERCOCET/ROXICET) 5-325 MG per tablet 1 tablet, 1 tablet, Oral, Q4H PRN, Kipp Brood, MD, 1 tablet at 02/01/20 1417 .  rosuvastatin (CRESTOR) tablet 40 mg, 40 mg, Oral, Daily, Kary Kos, MD, 40 mg at 01/31/20 1027 .  senna-docusate (Senokot-S) tablet 1 tablet, 1 tablet, Oral, BID, Kary Kos, MD, 1 tablet at 01/31/20 2128 .  simethicone (MYLICON) chewable tablet 160 mg, 160 mg, Oral, QID PRN, Acquanetta Chain, DO, 160 mg at 01/17/20 1002  Patients Current Diet:  Diet Order            Diet regular Room service appropriate? Yes; Fluid consistency: Thin  Diet effective now              Precautions / Restrictions Precautions Precautions: Fall Precaution Comments: Lt hemiparesis with Lt neglect  Restrictions Weight Bearing Restrictions: No Other Position/Activity Restrictions: keep BP >160   Has the  patient had 2 or more falls or a fall with injury in the past year?No  Prior Activity Level Community (5-7x/wk): very active PTA, is a Editor, commissioning, works full time; drives, no AD use PTA  Prior Functional Level Prior Function Level of Independence: Independent Comments: working as Clinical cytogeneticist Care: Did the patient need help bathing, dressing, using the toilet or eating?  Independent  Indoor Mobility: Did the patient need assistance with walking from room to room (with or without device)? Independent  Stairs: Did the patient need assistance with internal or external stairs (with or without device)? Independent  Functional Cognition: Did the patient need help planning regular tasks such as shopping or remembering to take medications? Independent  Home Assistive Devices / Equipment Home Assistive Devices/Equipment: None Home Equipment: Walker - 2 wheels, Crutches, Bedside commode  Prior Device Use: Indicate devices/aids used by the patient prior to current illness, exacerbation or injury? None of the above  Current Functional Level Cognition  Arousal/Alertness: Awake/alert Overall Cognitive Status: Impaired/Different from baseline Current Attention Level: Sustained Orientation Level: Oriented to person Following Commands: Follows one step commands inconsistently, Follows one step commands with increased time Safety/Judgement: Decreased awareness of safety, Decreased awareness of deficits General Comments: speaking some this session and following some commands especially when lifted to chair, more despondent per RN and family Attention: Selective Selective Attention: Impaired Selective Attention Impairment: Verbal basic Memory: Impaired Memory Impairment: Storage deficit, Retrieval deficit, Decreased recall of new information Awareness: Impaired Awareness Impairment: Intellectual impairment, Emergent impairment Problem Solving: Impaired Problem Solving  Impairment: Verbal complex Executive Function: Self Monitoring Self Monitoring: Impaired Self Monitoring Impairment: Functional basic Safety/Judgment: Impaired Comments: slow processing    Extremity Assessment (includes Sensation/Coordination)  Upper Extremity Assessment: LUE deficits/detail LUE Deficits / Details: AROM composite flexion/extenion of digits, supination/pronation, elbow flexion/extension, and shoulder forward flexion to ~90 degrees LUE Sensation: decreased light touch, decreased proprioception LUE Coordination: decreased fine motor, decreased gross motor  Lower Extremity Assessment: Defer to PT evaluation    ADLs  Overall ADL's : Needs assistance/impaired Eating/Feeding: Set up, Supervision/ safety, Sitting Eating/Feeding Details (indicate cue type and reason): Having pt locate and reach for cup in left visual field.  Grooming: Set up, Supervision/safety, Cueing for sequencing, Sitting Grooming Details (indicate cue type and reason): cues to maintain attention to task Upper Body Bathing: Maximal assistance, Sitting Lower Body Bathing: Moderate assistance, Sit to/from stand, Sitting/lateral leans Lower Body Bathing Details (indicate cue type and reason): Pt initiating useof LUE during functional tasks; Able to cross  RLE over left to bath. Facilitation for forward lean/anterior weight shift to bath LLE; lost attention and did not finish task Upper Body Dressing : Maximal assistance, Bed level Lower Body Dressing: Maximal assistance, Sit to/from stand Lower Body Dressing Details (indicate cue type and reason): Able to reach foot to assist wtih pulling up socks Toilet Transfer: Moderate assistance, Maximal assistance, +2 for physical assistance, +2 for safety/equipment(sara stedy to recliner) Toilet Transfer Details (indicate cue type and reason): Mod A +2 from elevate bed. Max A +2 for elevating from recliner. Use of sara stedy for blocking bil knees and provide bar to pull  with BUEs. Toileting- Clothing Manipulation and Hygiene: Total assistance Toileting - Clothing Manipulation Details (indicate cue type and reason): Pt aware of urine collection bag leaking onto floor during session Functional mobility during ADLs: Maximal assistance, +2 for physical assistance General ADL Comments: Pt turned so that wife was on L at end of session. Educated pt/wife and on need to place meaningful items on L to increase attention to L; Educated wife on L neglect - wife asking about prognosis    Mobility  Overal bed mobility: Needs Assistance Bed Mobility: Rolling, Sidelying to Sit, Sit to Supine Rolling: Max assist Sidelying to sit: Max assist, +2 for physical assistance Supine to sit: Max assist, +2 for physical assistance Sit to supine: Max assist, +2 for physical assistance General bed mobility comments: assist to bend knees and L hand across him to roll to R, assist for legs off bed and trunk upright, to supine assist for trunk and legs and repositioning    Transfers  Overall transfer level: Needs assistance Equipment used: Ambulation equipment used Transfer via Lift Equipment: Stedy Transfers: Sit to/from Stand Sit to Stand: +2 physical assistance, Total assist Stand pivot transfers: (seated on the stedy) General transfer comment: pt not initiating sit to stand despite two trials with assist for hands on stedy and lifting hips with pad under him, BP too low to continue so returned to supine    Ambulation / Gait / Stairs / Wheelchair Mobility  Ambulation/Gait Ambulation/Gait assistance: Counsellor (Feet): 400 Feet Assistive device: None Gait Pattern/deviations: Step-through pattern General Gait Details: pt unable to ambulate at this time Gait velocity: functional Gait velocity interpretation: 1.31 - 2.62 ft/sec, indicative of limited community ambulator    Posture / Balance Dynamic Sitting Balance Sitting balance - Comments: initially with minguard  for sitting balance, then attempting to get weigh on L UE and pt leaning forward, grabbing linens with R hand near catheter so held his hand and he would lean to L and forward when holding my hand on the R, with R and L HHA pt with improved balance, but still sagging forward with fatigue Balance Overall balance assessment: Needs assistance Sitting-balance support: Feet supported Sitting balance-Leahy Scale: Poor Sitting balance - Comments: initially with minguard for sitting balance, then attempting to get weigh on L UE and pt leaning forward, grabbing linens with R hand near catheter so held his hand and he would lean to L and forward when holding my hand on the R, with R and L HHA pt with improved balance, but still sagging forward with fatigue Postural control: Left lateral lean Standing balance support: Bilateral upper extremity supported, During functional activity Standing balance-Leahy Scale: Zero Standing balance comment: unable to stand this session    Special needs/care consideration Continuous Drip IV  0.9% NaCl with KCl 20 mEq/L infusion  Oxygen: 2L/min Skin: ecchymosis to right/left wrist,  skin tear to posterior penis, wound/incision puncture groin right arterial access site-right femoral Diabetic management: yes Behavioral consideration: withdrawn  Designated visitor: Stanton Kidney (wife) and son Marden Noble     Previous Environmental health practitioner (from acute therapy documentation) Living Arrangements: Spouse/significant other Available Help at Discharge: Family, Available 24 hours/day Type of Home: House Home Layout: One level Home Access: Stairs to enter Entrance Stairs-Rails: Right Entrance Stairs-Number of Steps: 3 Bathroom Shower/Tub: Chiropodist: [place]: No  Discharge Living Setting Plans for Discharge Living Setting: House, Lives with (comment)(lives with wife) Type of Home at Discharge: House Discharge Home Layout: One level Discharge Home  Access: Stairs to enter Entrance Stairs-Rails: None Entrance Stairs-Number of Steps: 2-3 steps Discharge Bathroom Shower/Tub: Tub/shower unit, Horticulturist, commercial: Standard Discharge Bathroom Accessibility: Yes How Accessible: Accessible via walker Does the patient have any problems obtaining your medications?: No  Social/Family/Support Systems Patient Roles: Spouse, Other (Comment)(has grown children; full time employee) Contact Information: wife: Stanton Kidney [phone] Anticipated Caregiver: wife +2 sons Anticipated Caregiver's Contact Information: see above Ability/Limitations of Caregiver: Min A Caregiver Availability: 24/7 Discharge Plan Discussed with Primary Caregiver: Yes(wife and pt) Is Caregiver In Agreement with Plan?: Yes Does Caregiver/Family have Issues with Lodging/Transportation while Pt is in Rehab?: No   Goals Patient/Family Goal for Rehab: PT/OT: Min A; SLP: Supervision/MIn A Expected length of stay: 21-24 days Cultural Considerations: NA Pt/Family Agrees to Admission and willing to participate: Yes Program Orientation Provided & Reviewed with Pt/Caregiver Including Roles  & Responsibilities: Yes(pt and wife)  Barriers to Discharge: Home environment access/layout  Barriers to Discharge Comments: steps to enter home   Decrease burden of Care through IP rehab admission: NA   Possible need for SNF placement upon discharge: Not anticipated; pt has good social support from family at DC and confirmed they are able to provide the anticipated assist level after CIR at DC. Anticipate pt can reach a Min A level to DC home safely.    Patient Condition: This patient's medical and functional status has changed since the consult dated: 01/25/20 in which the Rehabilitation Physician determined and documented that the patient's condition is appropriate for intensive rehabilitative care in an inpatient rehabilitation facility. See "History of Present Illness"  (above) for medical update. Functional changes are: a decrease in functional ability from Max A +2 to total A with mechanical lift due to increase in withdrawn emotions. Patient's medical and functional status update has been discussed with the Rehabilitation physician and patient remains appropriate for inpatient rehabilitation. Will admit to inpatient rehab tomorrow, Saturday 02/01/20.   Preadmission Screen Completed By:  Raechel Ache, OT, 02/01/2020 4:30 PM ______________________________________________________________________   Discussed status with Dr. Milagros Evener on 02/01/20 at 4:08PM and received approval for admission Saturday 02/02/20.  Admission Coordinator:  Raechel Ache, time 4:08PM/Date 02/01/20.

## 2020-02-01 NOTE — Progress Notes (Signed)
Patient remains withdrawn and only participating in Neuro assessment minimally, patient declined majority of scheduled medications to include Nimotop throughout the day. Patient's son at bedside and emotional support given.   Nicholas Lot, RN

## 2020-02-01 NOTE — Progress Notes (Addendum)
Inpatient Rehabilitation-Admissions Coordinator   I have received insurance approval for CIR. Notified pt who wants to pursue. I have received medical clearance from attending service for admit to CIR this Saturday 02/02/20. PM&R MD, Dr. Carlis Abbott will see patient Sauturday morning to confirm patient readiness. Pt's RN can call [facility] [phone]) by noon to get report.  Cheri Rous, OTR/L  Rehab Admissions Coordinator  [phone] 02/01/2020 12:03 PM  Addendum 4:32PM: reviewed consent forms and benefits letter with pt's wife, Corrie Dandy. All questions answered. Plan for admit tomorrow.   Cheri Rous, OTR/L  Rehab Admissions Coordinator  [phone] 02/01/2020 4:32 PM

## 2020-02-01 NOTE — Progress Notes (Signed)
Physical Therapy Treatment Patient Details Name: Nicholas [name] MRN: [id number] DOB: 09-26-1950 Today's Date: 02/01/2020    History of Present Illness 69 y.o. male admitted on 01/14/20 for HA and confusion found to have large volume SAH with bil ICA, MCA, BA vasospasm s/p angioplasty.  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 Pt also having runs of V-tach, so cardiology consulted.  Pt with significant PMH of DM, HTN, herpes simplex virus-1, hyperlipidemia, Hepatitis A.      PT Comments    Patient progressing with participation in [place] and moving neck and compliant to work on upright alignment as well as on cervical mobility this session.  Initially in bed had cervical extension, left lateral flexion and rotation.  Focus for OOB and upright orientation/tolerance.  Patient will benefit from continued skilled PT in the acute setting and follow up CIR level rehab at d/c.    Follow Up Recommendations  CIR     Equipment Recommendations  Wheelchair (measurements PT);Wheelchair cushion (measurements PT);Hospital bed    Recommendations for Other Services       Precautions / Restrictions Precautions Precautions: None Precaution Comments: Lt hemiparesis with Lt neglect  Restrictions Other Position/Activity Restrictions: keep BP >160    Mobility  Bed Mobility Overal bed mobility: Needs Assistance Bed Mobility: Rolling Rolling: Max assist         General bed mobility comments: assist to roll to place lift pad for OOB to chair, able to reach with R arm to hold rail  Transfers Overall transfer level: Needs assistance               General transfer comment: total A for bed to chair via maxisky lift  Ambulation/Gait                 Stairs             Wheelchair Mobility    Modified Rankin (Stroke Patients Only)       Balance       Sitting balance - Comments: seated in chair able to pull  forward with cue and increased time with R UE for positioning and to place pillow; positioned hips to tip to L a little for improved upright head positioning in chair                                    Cognition Arousal/Alertness: Awake/alert Behavior During Therapy: Flat affect Overall Cognitive Status: Impaired/Different from baseline Area of Impairment: Following commands;Safety/judgement;Attention;Problem solving                   Current Attention Level: Sustained Memory: Decreased short-term memory Following Commands: Follows one step commands inconsistently;Follows one step commands with increased time Safety/Judgement: Decreased awareness of safety;Decreased awareness of deficits   Problem Solving: Slow processing;Decreased initiation;Requires verbal cues;Requires tactile cues;Difficulty sequencing General Comments: speaking some this session and following some commands especially when lifted to chair, more despondent per RN and family      Exercises Other Exercises Other Exercises: Cervical AROM in sitting and PROM/AAROM for improved midline positiong soft tissue work to lateral and posterior cervical musculature    General Comments General comments (skin integrity, edema, etc.): son present initially and reports patient more melencholy today      Pertinent Vitals/Pain Pain Assessment: Faces Faces Pain Scale: Hurts little more Pain Location: "ow" when moving neck, L  LE Pain Descriptors / Indicators: Grimacing;Discomfort Pain Intervention(s): Repositioned;Monitored during session    Home Living                      Prior Function            PT Goals (current goals can now be found in the care plan section) Progress towards PT goals: Progressing toward goals    Frequency    Min 4X/week      PT Plan Current plan remains appropriate    Co-evaluation              AM-PAC PT "6 Clicks" Mobility   Outcome Measure  Help  needed turning from your back to your side while in a flat bed without using bedrails?: A Lot Help needed moving from lying on your back to sitting on the side of a flat bed without using bedrails?: Total Help needed moving to and from a bed to a chair (including a wheelchair)?: Total Help needed standing up from a chair using your arms (e.g., wheelchair or bedside chair)?: Total Help needed to walk in hospital room?: Total Help needed climbing 3-5 steps with a railing? : Total 6 Click Score: 7    End of Session Equipment Utilized During Treatment: Oxygen Activity Tolerance: Patient tolerated treatment well Patient left: in chair;with call bell/phone within reach;with chair alarm set;with nursing/sitter in room Nurse Communication: Need for lift equipment PT Visit Diagnosis: Muscle weakness (generalized) (M62.81);Hemiplegia and hemiparesis;Other abnormalities of gait and mobility (R26.89) Hemiplegia - Right/Left: Left Hemiplegia - dominant/non-dominant: Non-dominant Hemiplegia - caused by: Nontraumatic intracerebral hemorrhage     Time: 9798-9211 PT Time Calculation (min) (ACUTE ONLY): 23 min  Charges:  $Therapeutic Activity: 23-37 mins                     Nicholas [name], [name] [phone] 02/01/2020    Nicholas [name] 02/01/2020, 6:09 PM

## 2020-02-01 NOTE — Progress Notes (Signed)
  NEUROSURGERY PROGRESS NOTE   No issues overnight.  More withdrawn this am  EXAM:  BP (!) 186/97   Pulse 93   Temp 98.3 F (36.8 C) (Oral)   Resp (!) 21   Ht 6' (1.829 m)   Wt 92.2 kg   SpO2 99%   BMI 27.57 kg/m   Awake, alert, orientedalthough intermittent confusion Mild left facial and partial right ptosis  5/5 RUE/RLE, 4/5 LUE 2/5 distal LLE, minimal proximal strength  IMPRESSION/PLAN 69 y.o. male SAH d# 17, more withdrawn this am, but neurologically stable. Angio shows likely dissection as source of hemorrhage - started on ASA 325mg  daily. - continue to work with therapy. CIR candidate. Dispo planning.

## 2020-02-01 NOTE — Progress Notes (Signed)
Transcranial Doppler  Date POD PCO2 HCT BP  MCA ACA PCA OPHT SIPH VERT Basilar  4/27 MS     Right  Left   197  64   *  105   12  -17   27  23    150  29   -23  -31   -36      4/28,rs     Right  Left   134  131   -31  -92   38  -24   57  50   153  136   -18  -31   -42      5/1 GC     Right  Left   96  50   *  *   30  37   30  25   14   95   -60  -22   -63      5/3 MR      Right  Left   127  59   -14  *   36  *   47  34   36  103   -42  -18   -62      5/5      Right  Left                                       5/7     Right  Left                                       5/10   5/12 GC     Right  Left  Right  Left 191  89  99  47 -48  -103  -24  * 45  -43  34  22 36  30  34  24 84  67  72  45 *  *  *  * *    *   5/14 MR        Right  Left   132  58   -19     26  33   23  17   41  120   -43  -47     -51          MCA = Middle Cerebral Artery      OPHT = Opthalmic Artery     BASILAR = Basilar Artery   ACA = Anterior Cerebral Artery     SIPH = Carotid Siphon PCA = Posterior Cerebral Artery   VERT = Verterbral Artery                   Normal MCA = 62+\-12 ACA = 50+\-12 PCA = 42+\-23  *Unable to insonate 01/15/2020- Right Lind[place]ard ratio=7.3, left Lind[place]ard ratio=1.68 01/16/20 - Right Lindegaard ratio = 3.3, left Lindegaard ratio = 6.2      Nicholas [name] 02/01/2020 3:26 PM

## 2020-02-01 NOTE — Progress Notes (Addendum)
Patient withdrawn this morning and refusing to take medications, emotional support and encouragement given. Patient wife called to speak with patient and offer emotional support. Son now at bedside, patient still refusing medications. NSG notified and will continue to offer support.  [name], RN

## 2020-02-01 NOTE — H&P (Signed)
Physical Medicine and Rehabilitation Admission H&P    Chief Complaint  Patient presents with  . Headache  . Neck Pain  : HPI: Nicholas [name] is a 69 year old right-handed male history of hypertension, diabetes mellitus, hepatitis, BPH.  Per chart review patient lives with wife independent prior to admission.  Presented 01/14/2020 with nausea and headache and left-sided weakness.  CT angiogram of head and neck showed a large volume acute subarachnoid hemorrhage.  Large amount of subarachnoid blood seen in the basilar cisterns and extending into the sylvian fissure bilaterally right greater than left.  Interhemispheric subarachnoid hemorrhage present.  Negative for hydrocephalus.  Patient underwent cerebral angiogram balloon angioplasty of right internal carotid artery right middle cerebral artery and balloon angioplasty of basilar artery.  Keppra initiated for seizure prophylaxis.  Echocardiogram with ejection fraction of 70% with grade 1 diastolic dysfunction.  Maintained on Nimotop  for blood pressure control and vasospasms times protocol 21 days.  Cardiology services consulted 01/23/2020 for wide-complex tachycardia/hypotension and placed on amiodarone 200 mg daily as well is cleared for aspirin therapy...  Latest follow-up MRI of the brain showed mildly increased left subdural hematoma since 01/19/2020 9 mm in thickness.  Smaller right side subdural hematoma has regressed.  Associated mildly increased rightward midline shift was 7 mm.  Presently patient has been cleared to initiate Lovenox for DVT prophylaxis..  Palliative care consulted for goals of care.  Tolerating a regular diet.   Therapy evaluations completed and patient was admitted for a comprehensive rehab program.  Review of Systems  Constitutional: Negative for chills and fever.  HENT: Negative for hearing loss.   Eyes: Negative for blurred vision and double vision.  Respiratory: Negative for cough and shortness of breath.     Cardiovascular: Positive for leg swelling. Negative for chest pain and palpitations.  Gastrointestinal: Positive for nausea.  Genitourinary: Positive for urgency. Negative for dysuria, flank pain and hematuria.  Musculoskeletal: Positive for joint pain.  Skin: Negative for rash.  Neurological: Positive for headaches.  All other systems reviewed and are negative.  Past Medical History:  Diagnosis Date  . BPH (benign prostatic hypertrophy) 12/20/2011   Dr. Cleatrice Burke  . Colon polyp    Dr. Vernell Barrier, it was recommended that he have a follow-up colonoscopy in 2010.  Marland Kitchen Deviated septum   . Diabetes mellitus without complication [facility])    Eye exam - 2011  . Hepatitis A   . HSV-1 (herpes simplex virus 1) infection   . Hyperlipidemia   . Hypertension   . Migraine headache    Past Surgical History:  Procedure Laterality Date  . IR ANGIO EXTERNAL CAROTID SEL EXT CAROTID BILAT MOD SED  01/15/2020  . IR ANGIO INTRA EXTRACRAN SEL INTERNAL CAROTID BILAT MOD SED  01/15/2020  . IR ANGIO INTRA EXTRACRAN SEL INTERNAL CAROTID BILAT MOD SED  01/31/2020  . IR ANGIO VERTEBRAL SEL VERTEBRAL BILAT MOD SED  01/15/2020  . IR ANGIO VERTEBRAL SEL VERTEBRAL BILAT MOD SED  01/31/2020  . IR ENDOVASC INTRACRANIAL INF OTHER THAN THROMBO ART INC DIAG ANGIO  01/18/2020  . IR ENDOVASC INTRACRANIAL INF OTHER THAN THROMBO ART INC DIAG ANGIO EA ADD  01/18/2020  . IR PTA VASOSPASM ADD DIFF  01/18/2020  . IR PTA VASOSPASM INITIAL  01/18/2020  . NASAL SEPTUM SURGERY    . RADIOLOGY WITH ANESTHESIA N/A 01/15/2020   Procedure: IR WITH ANESTHESIA;  Surgeon: Lisbeth Renshaw, MD;  Location: [facility] OR;  Service: Radiology;  Laterality: N/A;  . RADIOLOGY  WITH ANESTHESIA N/A 01/18/2020   Procedure: IR WITH ANESTHESIA;  Surgeon: Lisbeth RenshawNundkumar, Neelesh, MD;  [facility];  Service: Radiology;  Laterality: N/A;   Family History  Problem Relation Age of Onset  . Diabetes Mother   . Neuropathy Mother   . Diabetes Father   . Cancer Father         Lung Cancer  . Diabetes Sister   . Diabetes Brother    Social History:  reports that he has never smoked. He has never used smokeless tobacco. He reports current alcohol use. He reports that he does not use drugs. Allergies: No Known Allergies Medications Prior to Admission  Medication Sig Dispense Refill  . aspirin (ASPIRIN ADULT LOW STRENGTH) 81 MG EC tablet Take 81 mg by mouth daily. Swallow whole.    Marland Kitchen. atorvastatin (LIPITOR) 80 MG tablet Take 1 tablet (80 mg total) by mouth daily. 90 tablet 1  . cholecalciferol (VITAMIN D3) 25 MCG (1000 UNIT) tablet Take 1,000 Units by mouth daily.    . CRESTOR 40 MG tablet Take 1 tablet (40 mg total) by mouth daily. 90 tablet 1  . fenofibrate 160 MG tablet Take 1 tablet (160 mg total) by mouth daily. 90 tablet 3  . insulin aspart (NOVOLOG) 100 UNIT/ML FlexPen Inject 75 Units into the skin daily.     . insulin degludec (TRESIBA) 200 UNIT/ML FlexTouch Pen Inject 35 Units into the skin daily.     . insulin detemir (LEVEMIR) 100 UNIT/ML FlexPen Inject 20 Units into the skin See admin instructions. Sliding scale : Takes 15 units if blood sugar more than 300. Max 20 units.    Marland Kitchen. lisinopril (PRINIVIL,ZESTRIL) 20 MG tablet Take 1 tablet (20 mg total) by mouth daily. 90 tablet 3  . XIGDUO XR 06-999 MG TB24 Take 1 tablet by mouth daily.    Marland Kitchen. glipiZIDE (GLUCOTROL XL) 10 MG 24 hr tablet Take 1 tablet (10 mg total) by mouth daily with breakfast. (Patient not taking: Reported on 01/14/2020) 90 tablet 1  . omega-3 acid ethyl esters (LOVAZA) 1 G capsule Take 2 capsules (2 g total) by mouth 2 (two) times daily. (Patient not taking: Reported on 01/14/2020) 360 capsule 1  . pioglitazone (ACTOS) 45 MG tablet Take 1 tablet (45 mg total) by mouth daily. (Patient not taking: Reported on 01/14/2020) 90 tablet 1  . sitaGLIPtin-metformin (JANUMET) 50-1000 MG per tablet Take 1 tablet by mouth 2 (two) times daily with a meal. (Patient not taking: Reported on 01/14/2020) 180 tablet 1     Drug Regimen Review Drug regimen was reviewed and remains appropriate with no significant issues identified  Home: Home Living Family/patient expects to be discharged to:: Private residence Living Arrangements: Spouse/significant other Available Help at Discharge: Family, Available 24 hours/day Type of Home: House Home Access: Stairs to enter Secretary/administratorntrance Stairs-Number of Steps: 3 Entrance Stairs-Rails: Right Home Layout: One level Bathroom Shower/Tub: Engineer, manufacturing systemsTub/shower unit Bathroom Toilet: Standard Home Equipment: Environmental consultantWalker - 2 wheels, Crutches, Bedside commode   Functional History: Prior Function Level of Independence: Independent Comments: working as Electrical engineerprogrammer  Functional Status:  Mobility: Bed Mobility Overal bed mobility: Needs Assistance Bed Mobility: Rolling, Sidelying to Sit, Sit to Supine Rolling: Max assist Sidelying to sit: Max assist, +2 for physical assistance Supine to sit: Max assist, +2 for physical assistance Sit to supine: Max assist, +2 for physical assistance General bed mobility comments: assist to bend knees and L hand across him to roll to R, assist for legs off bed and trunk  upright, to supine assist for trunk and legs and repositioning Transfers Overall transfer level: Needs assistance Equipment used: Ambulation equipment used Transfer via Lift Equipment: Stedy Transfers: Sit to/from Stand Sit to Stand: +2 physical assistance, Total assist Stand pivot transfers: (seated on the stedy) General transfer comment: pt not initiating sit to stand despite two trials with assist for hands on stedy and lifting hips with pad under him, BP too low to continue so returned to supine Ambulation/Gait Ambulation/Gait assistance: Min guard Gait Distance (Feet): 400 Feet Assistive device: None Gait Pattern/deviations: Step-through pattern General Gait Details: pt unable to ambulate at this time Gait velocity: functional Gait velocity interpretation: 1.31 - 2.62 ft/sec,  indicative of limited community ambulator    ADL: ADL Overall ADL's : Needs assistance/impaired Eating/Feeding: Set up, Supervision/ safety, Sitting Eating/Feeding Details (indicate cue type and reason): Having pt locate and reach for cup in left visual field.  Grooming: Set up, Supervision/safety, Cueing for sequencing, Sitting Grooming Details (indicate cue type and reason): cues to maintain attention to task Upper Body Bathing: Maximal assistance, Sitting Lower Body Bathing: Moderate assistance, Sit to/from stand, Sitting/lateral leans Lower Body Bathing Details (indicate cue type and reason): Pt initiating useof LUE during functional tasks; Able to cross RLE over left to bath. Facilitation for forward lean/anterior weight shift to bath LLE; lost attention and did not finish task Upper Body Dressing : Maximal assistance, Bed level Lower Body Dressing: Maximal assistance, Sit to/from stand Lower Body Dressing Details (indicate cue type and reason): Able to reach foot to assist wtih pulling up socks Toilet Transfer: Moderate assistance, Maximal assistance, +2 for physical assistance, +2 for safety/equipment(sara stedy to recliner) Toilet Transfer Details (indicate cue type and reason): Mod A +2 from elevate bed. Max A +2 for elevating from recliner. Use of sara stedy for blocking bil knees and provide bar to pull with BUEs. Toileting- Clothing Manipulation and Hygiene: Total assistance Toileting - Clothing Manipulation Details (indicate cue type and reason): Pt aware of urine collection bag leaking onto floor during session Functional mobility during ADLs: Maximal assistance, +2 for physical assistance General ADL Comments: Pt turned so that wife was on L at end of session. Educated pt/wife and on need to place meaningful items on L to increase attention to L; Educated wife on L neglect - wife asking about prognosis  Cognition: Cognition Overall Cognitive Status: Impaired/Different from  baseline Arousal/Alertness: Awake/alert Orientation Level: Oriented to person, Disoriented to place, Disoriented to time, Disoriented to situation Attention: Selective Selective Attention: Impaired Selective Attention Impairment: Verbal basic Memory: Impaired Memory Impairment: Storage deficit, Retrieval deficit, Decreased recall of new information Awareness: Impaired Awareness Impairment: Intellectual impairment, Emergent impairment Problem Solving: Impaired Problem Solving Impairment: Verbal complex Executive Function: Self Monitoring Self Monitoring: Impaired Self Monitoring Impairment: Functional basic Safety/Judgment: Impaired Comments: slow processing Cognition Arousal/Alertness: Awake/alert Behavior During Therapy: Flat affect Overall Cognitive Status: Impaired/Different from baseline Area of Impairment: Following commands, Awareness, Problem solving, Attention, Safety/judgement Current Attention Level: Focused Memory: Decreased short-term memory Following Commands: Follows one step commands inconsistently Safety/Judgement: Decreased awareness of safety, Decreased awareness of deficits Awareness: Emergent Problem Solving: Slow processing, Decreased initiation, Requires verbal cues, Requires tactile cues, Difficulty sequencing General Comments: nonverbal this session except to say Ow! when something hurts him  Physical Exam: Blood pressure (!) 151/72, pulse 89, temperature 98 F (36.7 C), temperature source Oral, resp. rate 20, height 6' (1.829 m), weight 92.2 kg, SpO2 97 %.  Physical Exam  Vitals reviewed. Constitutional: He appears well-developed and  well-nourished.  HENT:  Head: Normocephalic and atraumatic.  Eyes: Right eye exhibits no discharge. Left eye exhibits no discharge.  Right eye closed  Neck: No tracheal deviation present. No thyromegaly present.  Respiratory: Effort normal. No stridor. No respiratory distress.  GI: He exhibits distension.    Musculoskeletal:     Comments: LUE edema  Neurological: Patient is alert.  No acute distress.  Right gaze preference with left facial droop.  He does provide his name and age but limited historian.  Follows simple commands . Says "I'm in pain" but no other verbal output this exam Right lean Motor: Limited due to ability to follow commands and attention Right sided minimal movement noted upper and lower extremities LUE/LLE: 0/5 Right ptosis  Skin: Skin is warm and dry.  Psychiatric:  Unable to assess due to mentation    Results for orders placed or performed during the hospital encounter of 01/14/20 (from the past 48 hour(s))  Glucose, capillary     Status: Abnormal   Collection Time: 01/30/20  6:44 AM  Result Value Ref Range   Glucose-Capillary 152 (H) 70 - 99 mg/dL    Comment: Glucose reference range applies only to samples taken after fasting for at least 8 hours.  Glucose, capillary     Status: Abnormal   Collection Time: 01/30/20  8:15 AM  Result Value Ref Range   Glucose-Capillary 136 (H) 70 - 99 mg/dL    Comment: Glucose reference range applies only to samples taken after fasting for at least 8 hours.   Comment 1 Notify RN    Comment 2 Document in Chart   Glucose, capillary     Status: Abnormal   Collection Time: 01/30/20 11:52 AM  Result Value Ref Range   Glucose-Capillary 48 (L) 70 - 99 mg/dL    Comment: Glucose reference range applies only to samples taken after fasting for at least 8 hours.   Comment 1 Notify RN    Comment 2 Document in Chart   Glucose, capillary     Status: Abnormal   Collection Time: 01/30/20 12:22 PM  Result Value Ref Range   Glucose-Capillary 127 (H) 70 - 99 mg/dL    Comment: Glucose reference range applies only to samples taken after fasting for at least 8 hours.   Comment 1 Notify RN    Comment 2 Document in Chart   Glucose, capillary     Status: Abnormal   Collection Time: 01/30/20  3:45 PM  Result Value Ref Range   Glucose-Capillary 42  (LL) 70 - 99 mg/dL    Comment: Glucose reference range applies only to samples taken after fasting for at least 8 hours.   Comment 1 Repeat Test   Glucose, capillary     Status: None   Collection Time: 01/30/20  4:11 PM  Result Value Ref Range   Glucose-Capillary 80 70 - 99 mg/dL    Comment: Glucose reference range applies only to samples taken after fasting for at least 8 hours.  Glucose, capillary     Status: Abnormal   Collection Time: 01/30/20  5:30 PM  Result Value Ref Range   Glucose-Capillary 177 (H) 70 - 99 mg/dL    Comment: Glucose reference range applies only to samples taken after fasting for at least 8 hours.  Glucose, capillary     Status: Abnormal   Collection Time: 01/30/20  7:25 PM  Result Value Ref Range   Glucose-Capillary 149 (H) 70 - 99 mg/dL    Comment: Glucose reference  range applies only to samples taken after fasting for at least 8 hours.  Glucose, capillary     Status: Abnormal   Collection Time: 01/30/20 11:17 PM  Result Value Ref Range   Glucose-Capillary 125 (H) 70 - 99 mg/dL    Comment: Glucose reference range applies only to samples taken after fasting for at least 8 hours.  Glucose, capillary     Status: Abnormal   Collection Time: 01/31/20  3:05 AM  Result Value Ref Range   Glucose-Capillary 109 (H) 70 - 99 mg/dL    Comment: Glucose reference range applies only to samples taken after fasting for at least 8 hours.  Glucose, capillary     Status: Abnormal   Collection Time: 01/31/20  7:37 AM  Result Value Ref Range   Glucose-Capillary 121 (H) 70 - 99 mg/dL    Comment: Glucose reference range applies only to samples taken after fasting for at least 8 hours.  Glucose, capillary     Status: Abnormal   Collection Time: 01/31/20 11:05 AM  Result Value Ref Range   Glucose-Capillary 103 (H) 70 - 99 mg/dL    Comment: Glucose reference range applies only to samples taken after fasting for at least 8 hours.  Glucose, capillary     Status: Abnormal    Collection Time: 01/31/20  3:08 PM  Result Value Ref Range   Glucose-Capillary 124 (H) 70 - 99 mg/dL    Comment: Glucose reference range applies only to samples taken after fasting for at least 8 hours.  Glucose, capillary     Status: Abnormal   Collection Time: 01/31/20  9:59 PM  Result Value Ref Range   Glucose-Capillary 258 (H) 70 - 99 mg/dL    Comment: Glucose reference range applies only to samples taken after fasting for at least 8 hours.   VAS [place] TRANSCRANIAL DOPPLER  Result Date: 01/30/2020  Transcranial Doppler Indications: Subarachnoid hemorrhage. Limitations: Patient positioning, patient movement Limitations for diagnostic windows: Unable to insonate left transtemporal window. Unable to insonate foramen window. Comparison Study: 01/28/2020 Performing Technologist: Chanda Busing RVT  Examination Guidelines: A complete evaluation includes B-mode imaging, spectral Doppler, color Doppler, and power Doppler as needed of all accessible portions of each vessel. Bilateral testing is considered an integral part of a complete examination. Limited examinations for reoccurring indications may be performed as noted.  +----------+-------------+----------+-----------+-------+ RIGHT TCD Right VM (cm)Depth (cm)PulsatilityComment +----------+-------------+----------+-----------+-------+ MCA           99.00                 1.35            +----------+-------------+----------+-----------+-------+ ACA          -24.00                 1.69            +----------+-------------+----------+-----------+-------+ Term ICA     122.00                  1.3            +----------+-------------+----------+-----------+-------+ PCA           34.00                 1.55            +----------+-------------+----------+-----------+-------+ Opthalmic     34.00                 2.23            +----------+-------------+----------+-----------+-------+  ICA siphon    72.00                 1.36             +----------+-------------+----------+-----------+-------+  +----------+------------+----------+-----------+-------+ LEFT TCD  Left VM (cm)Depth (cm)PulsatilityComment +----------+------------+----------+-----------+-------+ MCA          47.00                 1.29            +----------+------------+----------+-----------+-------+ PCA          22.00                 2.02            +----------+------------+----------+-----------+-------+ Opthalmic    24.00                 2.14            +----------+------------+----------+-----------+-------+ ICA siphon   45.00                 1.69            +----------+------------+----------+-----------+-------+  Summary:  Elevated right middle cerebral and right terminal carotid artery mean flow velocities suggest mild vasospasm but improved from last study.Normal mean flow velocities in remaining identified vessels of anterior and posterior cerebral circulations. *See table(s) above for TCD measurements and observations.  Diagnosing physician: Delia Heady MD Electronically signed by Delia Heady MD on 01/30/2020 at 4:02:18 PM.    Final    IR ANGIO INTRA EXTRACRAN SEL INTERNAL CAROTID BILAT MOD SED  Result Date: 01/31/2020 PROCEDURE: DIAGNOSTIC CEREBRAL ANGIOGRAM HISTORY: The patient is a 69 year old and hospital with subarachnoid hemorrhage. Previous angiograms were negative for saccular aneurysm, with severe vasospasm seen on most recent angiogram. The patient has been monitored in the intensive care unit with stable neurologic exam. He presents today for follow-up diagnostic cerebral angiogram to assess both status of vasospasm and source for subarachnoid hemorrhage. ACCESS: The technical aspects of the procedure as well as its potential risks and benefits were reviewed with the patient's family. These risks included but were not limited bleeding, infection, allergic reaction, damage to organs or vital structures, stroke,  non-diagnostic procedure, and the catastrophic outcomes of heart attack, coma, and death. With an understanding of these risks, informed consent was obtained and witnessed. The patient was placed in the supine position on the angiography table and the skin of right groin prepped in the usual sterile fashion. The procedure was performed under local anesthesia (1%-solution of bicarbonate-buffered Lidocaine) and conscious sedation with 12.45micrograms fentanyl monitored by myself and the in-suite nurse using continuous pulse-oximetry, heart rate, and non-invasive blood-pressure. A 5- French sheath was introduced in the right common femoral artery using Seldinger technique. A fluoro-phase sequence was used to document the sheath position. MEDICATIONS: HEPARIN: 0 Units total. CONTRAST:  cc, Omnipaque 300 FLUOROSCOPY TIME:  FLUOROSCOPY TIME: See IR records TECHNIQUE: CATHETERS AND WIRES 5-French JB-1 catheter 0.035" glidewire VESSELS CATHETERIZED Right internal carotid Left internal carotid Left vertebral Right vertebral Right common femoral VESSELS STUDIED Right internal carotid, head Left internal carotid, head Left vertebral Right vertebral Right femoral PROCEDURAL NARRATIVE A 5-Fr JB-1 catheter was advanced over a 0.035 glidewire into the aortic arch. The above vessels were then sequentially catheterized and cervical / cerebral angiograms taken. After review of images, the catheter was removed without incident. FINDINGS: Right internal carotid, head: Injection reveals the presence of a patent ICA, M1, and A1 segments and their  branches. The right A1 is again noted to be somewhat small in caliber. Overall, degree of vasospasm the supraclinoid internal carotid artery is significant in comparison to the prior angiogram. No saccular aneurysms, arteriovenous malformations, or fistulas are seen. There is non uniform opacification of the supraclinoid segment of the ICA with suggestion of possible dissection flap. Nonetheless,  there is no significant flow limitation. The parenchymal and venous phases are normal. The venous sinuses are widely patent. Left internal carotid, head: Injection reveals the presence of a widely patent ICA, A1, and M1 segments and their branches. There is moderate spasm involving the supraclinoid segment of the left internal carotid artery without flow limitation. No aneurysms, AVMs, or high-flow fistulas are seen. The parenchymal and venous phases are normal. The venous sinuses are widely patent. Left vertebral: Injection reveals the presence of a widely patent vertebral artery. This leads to a widely patent basilar artery that terminates in right P1. The basilar apex is normal. No aneurysms, AVMs, or high-flow fistulas are seen. Severe vasospasm of the basilar artery on the previous angiogram has largely resolved. The parenchymal and venous phases are normal. The venous sinuses are widely patent. Right vertebral: The vertebral artery is patent with mild to moderate spasm involving the distal portion of the right V4 segment. No PICA aneurysm is seen. See basilar description above. Right femoral: Normal vessel. No significant atherosclerotic disease. Arterial sheath in adequate position. DISPOSITION: Upon completion of the study, the femoral sheath was removed and hemostasis obtained using a 5-Fr ExoSeal closure device. Good proximal and distal lower extremity pulses were documented upon achievement of hemostasis. The procedure was well tolerated and no early complications were observed. The patient was transferred to the holding area to lay flat for 2 hours. IMPRESSION: 1. No saccular aneurysms, arteriovenous malformations, or high-flow fistulas are identified. There is decreased opacification of the supraclinoid segment of the right internal carotid artery which may suggest the presence of a dissection, and pseudoaneurysm of the source of hemorrhage. There is no significant flow limitation. 2. Significant  overall improvement in the degree of intracranial vasospasm in comparison to the prior angiogram. The preliminary results of this procedure were shared with the patient and the patient's family. Electronically Signed   By: Lisbeth Renshaw   On: 01/31/2020 13:48   IR ANGIO VERTEBRAL SEL VERTEBRAL BILAT MOD SED  Result Date: 01/31/2020 PROCEDURE: DIAGNOSTIC CEREBRAL ANGIOGRAM HISTORY: The patient is a 69 year old and hospital with subarachnoid hemorrhage. Previous angiograms were negative for saccular aneurysm, with severe vasospasm seen on most recent angiogram. The patient has been monitored in the intensive care unit with stable neurologic exam. He presents today for follow-up diagnostic cerebral angiogram to assess both status of vasospasm and source for subarachnoid hemorrhage. ACCESS: The technical aspects of the procedure as well as its potential risks and benefits were reviewed with the patient's family. These risks included but were not limited bleeding, infection, allergic reaction, damage to organs or vital structures, stroke, non-diagnostic procedure, and the catastrophic outcomes of heart attack, coma, and death. With an understanding of these risks, informed consent was obtained and witnessed. The patient was placed in the supine position on the angiography table and the skin of right groin prepped in the usual sterile fashion. The procedure was performed under local anesthesia (1%-solution of bicarbonate-buffered Lidocaine) and conscious sedation with 12.103micrograms fentanyl monitored by myself and the in-suite nurse using continuous pulse-oximetry, heart rate, and non-invasive blood-pressure. A 5- French sheath was introduced in the right common femoral  artery using Seldinger technique. A fluoro-phase sequence was used to document the sheath position. MEDICATIONS: HEPARIN: 0 Units total. CONTRAST:  cc, Omnipaque 300 FLUOROSCOPY TIME:  FLUOROSCOPY TIME: See IR records TECHNIQUE: CATHETERS AND  WIRES 5-French JB-1 catheter 0.035" glidewire VESSELS CATHETERIZED Right internal carotid Left internal carotid Left vertebral Right vertebral Right common femoral VESSELS STUDIED Right internal carotid, head Left internal carotid, head Left vertebral Right vertebral Right femoral PROCEDURAL NARRATIVE A 5-Fr JB-1 catheter was advanced over a 0.035 glidewire into the aortic arch. The above vessels were then sequentially catheterized and cervical / cerebral angiograms taken. After review of images, the catheter was removed without incident. FINDINGS: Right internal carotid, head: Injection reveals the presence of a patent ICA, M1, and A1 segments and their branches. The right A1 is again noted to be somewhat small in caliber. Overall, degree of vasospasm the supraclinoid internal carotid artery is significant in comparison to the prior angiogram. No saccular aneurysms, arteriovenous malformations, or fistulas are seen. There is non uniform opacification of the supraclinoid segment of the ICA with suggestion of possible dissection flap. Nonetheless, there is no significant flow limitation. The parenchymal and venous phases are normal. The venous sinuses are widely patent. Left internal carotid, head: Injection reveals the presence of a widely patent ICA, A1, and M1 segments and their branches. There is moderate spasm involving the supraclinoid segment of the left internal carotid artery without flow limitation. No aneurysms, AVMs, or high-flow fistulas are seen. The parenchymal and venous phases are normal. The venous sinuses are widely patent. Left vertebral: Injection reveals the presence of a widely patent vertebral artery. This leads to a widely patent basilar artery that terminates in right P1. The basilar apex is normal. No aneurysms, AVMs, or high-flow fistulas are seen. Severe vasospasm of the basilar artery on the previous angiogram has largely resolved. The parenchymal and venous phases are normal. The venous  sinuses are widely patent. Right vertebral: The vertebral artery is patent with mild to moderate spasm involving the distal portion of the right V4 segment. No PICA aneurysm is seen. See basilar description above. Right femoral: Normal vessel. No significant atherosclerotic disease. Arterial sheath in adequate position. DISPOSITION: Upon completion of the study, the femoral sheath was removed and hemostasis obtained using a 5-Fr ExoSeal closure device. Good proximal and distal lower extremity pulses were documented upon achievement of hemostasis. The procedure was well tolerated and no early complications were observed. The patient was transferred to the holding area to lay flat for 2 hours. IMPRESSION: 1. No saccular aneurysms, arteriovenous malformations, or high-flow fistulas are identified. There is decreased opacification of the supraclinoid segment of the right internal carotid artery which may suggest the presence of a dissection, and pseudoaneurysm of the source of hemorrhage. There is no significant flow limitation. 2. Significant overall improvement in the degree of intracranial vasospasm in comparison to the prior angiogram. The preliminary results of this procedure were shared with the patient and the patient's family. Electronically Signed   By: Lisbeth Renshaw   On: 01/31/2020 13:48   Medical Problem List and Plan: 1.  Left-sided weakness secondary to left greater than right subdural hematoma.  Status post cerebral angiogram balloon angioplasty  -patient may shower  -ELOS/Goals: min assist  with PT, min assist with OT, supervision and min assist with SLP. 27-30 days. 2.  Antithrombotics: -DVT/anticoagulation: Lovenox initiated 01/29/2020  -antiplatelet therapy: Aspirin 325 mg daily 3. Pain Management: Oxycodone as needed, Robaxin as needed 4. Mood: Provide emotional support  -  antipsychotic agents: N/A 5. Neuropsych: This patient is not capable of making decisions on his own behalf. 6.  Skin/Wound Care: Routine skin checks 7. Fluids/Electrolytes/Nutrition: Routine in and outs with follow-up chemistries. Poor oral intake.  8.  Diabetes mellitus.  Hemoglobin A1c 7.7.  Presently on Levemir 28 units twice daily.  Check blood sugars before meals and at bedtime.  Patient on Tresiba 35 units daily, Glucotrol 10 mg daily, Actos 45 mg daily, Janumet 50-1000 mg twice daily, NovoLog 75 units daily prior to admission.  Resume as needed  5/15: CBG elevated this morning to 204 but only mildly elevated last night. Continue to monitor. Currently on Levemir 30U BID.  9.  Seizure prophylaxis.  Keppra 500 mg twice daily 10.  Arrhythmia/wide-complex tachycardia.  Amiodarone 200 mg daily x4 weeks.  Follow-up cardiology services 11.  Hypertension.  Autoregulate on Nimotop. 12.  Hyperlipidemia.  Crestor/fenofibrate 13. Mild vasospasm evident on 5/14 vascular transcranial doppler.  Lavon Paganini Angiulli, PA-C 02/01/2020   I have personally performed a face to face diagnostic evaluation, including, but not limited to relevant history and physical exam findings, of this patient and developed relevant assessment and plan.  Additionally, I have reviewed and concur with the physician assistant's documentation above.  Leeroy Cha, MD

## 2020-02-02 ENCOUNTER — Encounter (HOSPITAL_COMMUNITY): Payer: Self-pay

## 2020-02-02 ENCOUNTER — Inpatient Hospital Stay (HOSPITAL_COMMUNITY)
Admission: RE | Admit: 2020-02-02 | Discharge: 2020-03-07 | DRG: 057 | Disposition: A | Discharge status: Hospice | Payer: BC Managed Care – PPO | Source: Intra-hospital | Attending: Physical Medicine & Rehabilitation | Admitting: Physical Medicine & Rehabilitation

## 2020-02-02 ENCOUNTER — Other Ambulatory Visit: Payer: Self-pay

## 2020-02-02 DIAGNOSIS — M791 Myalgia, unspecified site: Secondary | ICD-10-CM

## 2020-02-02 DIAGNOSIS — S065X9A Traumatic subdural hemorrhage with loss of consciousness of unspecified duration, initial encounter: Secondary | ICD-10-CM | POA: Diagnosis not present

## 2020-02-02 DIAGNOSIS — Z298 Encounter for other specified prophylactic measures: Secondary | ICD-10-CM

## 2020-02-02 DIAGNOSIS — R451 Restlessness and agitation: Secondary | ICD-10-CM | POA: Diagnosis not present

## 2020-02-02 DIAGNOSIS — E1165 Type 2 diabetes mellitus with hyperglycemia: Secondary | ICD-10-CM | POA: Diagnosis present

## 2020-02-02 DIAGNOSIS — Z66 Do not resuscitate: Secondary | ICD-10-CM | POA: Diagnosis not present

## 2020-02-02 DIAGNOSIS — D62 Acute posthemorrhagic anemia: Secondary | ICD-10-CM | POA: Diagnosis present

## 2020-02-02 DIAGNOSIS — I951 Orthostatic hypotension: Secondary | ICD-10-CM | POA: Diagnosis not present

## 2020-02-02 DIAGNOSIS — M545 Low back pain: Secondary | ICD-10-CM | POA: Diagnosis not present

## 2020-02-02 DIAGNOSIS — S065XAA Traumatic subdural hemorrhage with loss of consciousness status unknown, initial encounter: Secondary | ICD-10-CM

## 2020-02-02 DIAGNOSIS — Z20822 Contact with and (suspected) exposure to covid-19: Secondary | ICD-10-CM | POA: Diagnosis present

## 2020-02-02 DIAGNOSIS — Z794 Long term (current) use of insulin: Secondary | ICD-10-CM

## 2020-02-02 DIAGNOSIS — R000 Tachycardia, unspecified: Secondary | ICD-10-CM | POA: Diagnosis not present

## 2020-02-02 DIAGNOSIS — E785 Hyperlipidemia, unspecified: Secondary | ICD-10-CM | POA: Diagnosis present

## 2020-02-02 DIAGNOSIS — E11649 Type 2 diabetes mellitus with hypoglycemia without coma: Secondary | ICD-10-CM | POA: Diagnosis not present

## 2020-02-02 DIAGNOSIS — Z7982 Long term (current) use of aspirin: Secondary | ICD-10-CM

## 2020-02-02 DIAGNOSIS — M62838 Other muscle spasm: Secondary | ICD-10-CM | POA: Diagnosis not present

## 2020-02-02 DIAGNOSIS — I169 Hypertensive crisis, unspecified: Secondary | ICD-10-CM | POA: Diagnosis present

## 2020-02-02 DIAGNOSIS — I69292 Facial weakness following other nontraumatic intracranial hemorrhage: Secondary | ICD-10-CM

## 2020-02-02 DIAGNOSIS — R52 Pain, unspecified: Secondary | ICD-10-CM

## 2020-02-02 DIAGNOSIS — R7401 Elevation of levels of liver transaminase levels: Secondary | ICD-10-CM | POA: Diagnosis not present

## 2020-02-02 DIAGNOSIS — B009 Herpesviral infection, unspecified: Secondary | ICD-10-CM | POA: Diagnosis present

## 2020-02-02 DIAGNOSIS — N400 Enlarged prostate without lower urinary tract symptoms: Secondary | ICD-10-CM | POA: Diagnosis present

## 2020-02-02 DIAGNOSIS — G8110 Spastic hemiplegia affecting unspecified side: Secondary | ICD-10-CM

## 2020-02-02 DIAGNOSIS — I472 Ventricular tachycardia: Secondary | ICD-10-CM | POA: Diagnosis not present

## 2020-02-02 DIAGNOSIS — M25561 Pain in right knee: Secondary | ICD-10-CM | POA: Diagnosis not present

## 2020-02-02 DIAGNOSIS — I69254 Hemiplegia and hemiparesis following other nontraumatic intracranial hemorrhage affecting left non-dominant side: Secondary | ICD-10-CM | POA: Diagnosis present

## 2020-02-02 DIAGNOSIS — Z7289 Other problems related to lifestyle: Secondary | ICD-10-CM

## 2020-02-02 DIAGNOSIS — I69190 Apraxia following nontraumatic intracerebral hemorrhage: Secondary | ICD-10-CM | POA: Diagnosis not present

## 2020-02-02 DIAGNOSIS — I10 Essential (primary) hypertension: Secondary | ICD-10-CM | POA: Diagnosis present

## 2020-02-02 DIAGNOSIS — Z79899 Other long term (current) drug therapy: Secondary | ICD-10-CM

## 2020-02-02 DIAGNOSIS — R4587 Impulsiveness: Secondary | ICD-10-CM | POA: Diagnosis not present

## 2020-02-02 DIAGNOSIS — E871 Hypo-osmolality and hyponatremia: Secondary | ICD-10-CM | POA: Diagnosis present

## 2020-02-02 DIAGNOSIS — I609 Nontraumatic subarachnoid hemorrhage, unspecified: Secondary | ICD-10-CM | POA: Diagnosis not present

## 2020-02-02 DIAGNOSIS — R0989 Other specified symptoms and signs involving the circulatory and respiratory systems: Secondary | ICD-10-CM | POA: Diagnosis not present

## 2020-02-02 DIAGNOSIS — R7309 Other abnormal glucose: Secondary | ICD-10-CM | POA: Diagnosis not present

## 2020-02-02 DIAGNOSIS — L8990 Pressure ulcer of unspecified site, unspecified stage: Secondary | ICD-10-CM | POA: Insufficient documentation

## 2020-02-02 DIAGNOSIS — R159 Full incontinence of feces: Secondary | ICD-10-CM | POA: Diagnosis not present

## 2020-02-02 LAB — CBC
HCT: 32.4 % — ABNORMAL LOW (ref 39.0–52.0)
Hemoglobin: 10.6 g/dL — ABNORMAL LOW (ref 13.0–17.0)
MCH: 29 pg (ref 26.0–34.0)
MCHC: 32.7 g/dL (ref 30.0–36.0)
MCV: 88.8 fL (ref 80.0–100.0)
Platelets: 416 10*3/uL — ABNORMAL HIGH (ref 150–400)
RBC: 3.65 MIL/uL — ABNORMAL LOW (ref 4.22–5.81)
RDW: 14.4 % (ref 11.5–15.5)
WBC: 11.6 10*3/uL — ABNORMAL HIGH (ref 4.0–10.5)
nRBC: 0 % (ref 0.0–0.2)

## 2020-02-02 LAB — GLUCOSE, CAPILLARY
Glucose-Capillary: 135 mg/dL — ABNORMAL HIGH (ref 70–99)
Glucose-Capillary: 204 mg/dL — ABNORMAL HIGH (ref 70–99)
Glucose-Capillary: 180 mg/dL — ABNORMAL HIGH (ref 70–99)
Glucose-Capillary: 181 mg/dL — ABNORMAL HIGH (ref 70–99)

## 2020-02-02 LAB — CREATININE, SERUM
Creatinine, Ser: 0.62 mg/dL (ref 0.61–1.24)
GFR calc Af Amer: >60 mL/min (ref >60)
GFR calc non Af Amer: >60 mL/min (ref >60)

## 2020-02-02 MED ORDER — INSULIN ASPART 100 UNIT/ML ~~LOC~~ SOLN
0.0000 [IU] | Freq: Three times a day (TID) | SUBCUTANEOUS | Status: DC
  Administered 2020-02-02: 1 [IU] via SUBCUTANEOUS
  Administered 2020-02-03: 3 [IU] via SUBCUTANEOUS
  Administered 2020-02-03: 1 [IU] via SUBCUTANEOUS
  Administered 2020-02-03: 2 [IU] via SUBCUTANEOUS
  Administered 2020-02-04 – 2020-02-08 (×6): 1 [IU] via SUBCUTANEOUS
  Administered 2020-02-08: 3 [IU] via SUBCUTANEOUS
  Administered 2020-02-08: 1 [IU] via SUBCUTANEOUS
  Administered 2020-02-09: 3 [IU] via SUBCUTANEOUS
  Administered 2020-02-09: 1 [IU] via SUBCUTANEOUS
  Administered 2020-02-11 – 2020-02-13 (×5): 2 [IU] via SUBCUTANEOUS
  Administered 2020-02-13: 1 [IU] via SUBCUTANEOUS
  Administered 2020-02-13: 2 [IU] via SUBCUTANEOUS
  Administered 2020-02-14: 1 [IU] via SUBCUTANEOUS
  Administered 2020-02-14: 2 [IU] via SUBCUTANEOUS
  Administered 2020-02-16 – 2020-02-22 (×6): 1 [IU] via SUBCUTANEOUS
  Administered 2020-02-23: 3 [IU] via SUBCUTANEOUS
  Administered 2020-02-23 – 2020-02-24 (×2): 1 [IU] via SUBCUTANEOUS
  Administered 2020-02-25 (×2): 0 [IU] via SUBCUTANEOUS
  Administered 2020-02-26: 2 [IU] via SUBCUTANEOUS
  Administered 2020-02-27: 1 [IU] via SUBCUTANEOUS
  Administered 2020-02-29: 2 [IU] via SUBCUTANEOUS
  Administered 2020-02-29: 4 [IU] via SUBCUTANEOUS
  Administered 2020-03-01 – 2020-03-02 (×2): 1 [IU] via SUBCUTANEOUS
  Administered 2020-03-02: 2 [IU] via SUBCUTANEOUS
  Administered 2020-03-04 – 2020-03-06 (×6): 1 [IU] via SUBCUTANEOUS
  Administered 2020-03-07: 2 [IU] via SUBCUTANEOUS
  Administered 2020-03-07: 3 [IU] via SUBCUTANEOUS

## 2020-02-02 MED ORDER — AMIODARONE HCL 200 MG PO TABS
200.0000 mg | ORAL_TABLET | Freq: Every day | ORAL | Status: DC
  Administered 2020-02-02 – 2020-02-29 (×28): 200 mg via ORAL
  Filled 2020-02-02 (×28): qty 1

## 2020-02-02 MED ORDER — BISACODYL 5 MG PO TBEC
10.0000 mg | DELAYED_RELEASE_TABLET | Freq: Every day | ORAL | Status: DC
  Administered 2020-02-03 – 2020-02-17 (×13): 10 mg via ORAL
  Filled 2020-02-02 (×15): qty 2

## 2020-02-02 MED ORDER — ACETAMINOPHEN 650 MG RE SUPP: 650.0000 mg | RECTAL | Status: DC | PRN

## 2020-02-02 MED ORDER — METHOCARBAMOL 500 MG PO TABS
500.0000 mg | ORAL_TABLET | Freq: Three times a day (TID) | ORAL | Status: DC | PRN
  Administered 2020-02-11 – 2020-03-07 (×18): 500 mg via ORAL
  Filled 2020-02-02 (×18): qty 1

## 2020-02-02 MED ORDER — SIMETHICONE 80 MG PO CHEW: 160.0000 mg | CHEWABLE_TABLET | Freq: Four times a day (QID) | ORAL | Status: DC | PRN

## 2020-02-02 MED ORDER — SENNOSIDES-DOCUSATE SODIUM 8.6-50 MG PO TABS
1.0000 | ORAL_TABLET | Freq: Two times a day (BID) | ORAL | Status: DC
  Administered 2020-02-02 – 2020-03-07 (×67): 1 via ORAL
  Filled 2020-02-02 (×70): qty 1

## 2020-02-02 MED ORDER — ASPIRIN 325 MG PO TABS
325.0000 mg | ORAL_TABLET | Freq: Every day | ORAL | Status: DC
  Administered 2020-02-02 – 2020-03-07 (×35): 325 mg via ORAL
  Filled 2020-02-02 (×34): qty 1

## 2020-02-02 MED ORDER — ACETAMINOPHEN 160 MG/5ML PO SOLN
650.0000 mg | ORAL | Status: DC | PRN
  Administered 2020-02-27: 650 mg
  Filled 2020-02-02: qty 20.3

## 2020-02-02 MED ORDER — INSULIN DETEMIR 100 UNIT/ML ~~LOC~~ SOLN
30.0000 [IU] | Freq: Two times a day (BID) | SUBCUTANEOUS | Status: DC
  Administered 2020-02-02 – 2020-02-03 (×2): 30 [IU] via SUBCUTANEOUS
  Filled 2020-02-02 (×5): qty 0.3

## 2020-02-02 MED ORDER — ROSUVASTATIN CALCIUM 20 MG PO TABS
40.0000 mg | ORAL_TABLET | Freq: Every day | ORAL | Status: DC
  Administered 2020-02-02 – 2020-03-07 (×35): 40 mg via ORAL
  Filled 2020-02-02 (×34): qty 2

## 2020-02-02 MED ORDER — SORBITOL 70 % SOLN
30.0000 mL | Freq: Every day | Status: DC | PRN
  Administered 2020-02-25: 30 mL via ORAL
  Filled 2020-02-02: qty 30

## 2020-02-02 MED ORDER — ENOXAPARIN SODIUM 40 MG/0.4ML ~~LOC~~ SOLN
40.0000 mg | SUBCUTANEOUS | Status: DC
  Administered 2020-02-02 – 2020-03-07 (×35): 40 mg via SUBCUTANEOUS
  Filled 2020-02-02 (×35): qty 0.4

## 2020-02-02 MED ORDER — OXYCODONE-ACETAMINOPHEN 5-325 MG PO TABS
1.0000 | ORAL_TABLET | ORAL | Status: DC | PRN
  Administered 2020-02-03 – 2020-02-07 (×7): 1 via ORAL
  Filled 2020-02-02 (×8): qty 1

## 2020-02-02 MED ORDER — ENOXAPARIN SODIUM 40 MG/0.4ML ~~LOC~~ SOLN: 40.0000 mg | SUBCUTANEOUS | Status: DC

## 2020-02-02 MED ORDER — LEVETIRACETAM 100 MG/ML PO SOLN
500.0000 mg | Freq: Two times a day (BID) | ORAL | Status: DC
  Administered 2020-02-02 – 2020-02-11 (×19): 500 mg via ORAL
  Filled 2020-02-02 (×20): qty 5

## 2020-02-02 MED ORDER — NIMODIPINE 6 MG/ML PO SOLN
60.0000 mg | ORAL | Status: DC
  Administered 2020-02-02 – 2020-02-27 (×126): 60 mg via ORAL
  Filled 2020-02-02 (×154): qty 10

## 2020-02-02 MED ORDER — ACETAMINOPHEN 325 MG PO TABS
650.0000 mg | ORAL_TABLET | ORAL | Status: DC | PRN
  Administered 2020-02-02 – 2020-03-07 (×19): 650 mg via ORAL
  Filled 2020-02-02 (×23): qty 2

## 2020-02-02 MED ORDER — BISACODYL 10 MG RE SUPP: 10.0000 mg | Freq: Every day | RECTAL | Status: DC | PRN

## 2020-02-02 MED ORDER — VITAMIN D 25 MCG (1000 UNIT) PO TABS
1000.0000 [IU] | ORAL_TABLET | Freq: Every day | ORAL | Status: DC
  Administered 2020-02-02 – 2020-03-07 (×35): 1000 [IU] via ORAL
  Filled 2020-02-02 (×34): qty 1

## 2020-02-02 MED ORDER — NIMODIPINE 30 MG PO CAPS
60.0000 mg | ORAL_CAPSULE | ORAL | Status: DC
  Administered 2020-02-02 – 2020-02-26 (×16): 60 mg via ORAL
  Filled 2020-02-02 (×36): qty 2

## 2020-02-02 MED ORDER — FENOFIBRATE 160 MG PO TABS
160.0000 mg | ORAL_TABLET | Freq: Every day | ORAL | Status: DC
  Administered 2020-02-02 – 2020-03-07 (×35): 160 mg via ORAL
  Filled 2020-02-02 (×35): qty 1

## 2020-02-02 NOTE — Discharge Summary (Signed)
Physician Discharge Summary  Patient ID: Nicholas [name] MRN: [id number] DOB/AGE: 1951-06-01 69 y.o.  Admit date: 01/14/2020 Discharge date: 02/02/2020  Admission Diagnoses: Subarachnoid hemorrhage    Discharge Diagnoses: Subarachnoid hemorrhage, vasospasm, stroke, vessel dissection   Discharged Condition: stable  Hospital Course: The patient was admitted on 01/14/2020 with subarachnoid hemorrhage.  Initial CTA was negative for aneurysm or AVM.  He was admitted to the ICU and had a long protracted hospital course.  He developed significant vasospasm noted up having right hemisphere strokes and extremity weakness.  He had multiple angiograms including balloon angioplasty and underwent maximum triple H therapy.  He suffered cardiac arrhythmias which was managed by cardiology.  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  He was started on aspirin.  Over time he improved to the point that we felt he was stable for discharge to rehab for continued treatment and strengthening.   Consults: cardiology, pulmonary/intensive care, neurology and rehabilitation medicine  Significant Diagnostic Studies:  Results for orders placed or performed during the hospital encounter of 01/14/20  Blood Cultures (routine x 2)   Specimen: BLOOD  Result Value Ref Range   Specimen Description      BLOOD BLOOD RIGHT FOREARM Performed at [facility], [address] [place], [place] [address]    Special Requests      BOTTLES DRAWN AEROBIC AND ANAEROBIC Blood Culture adequate volume Performed at [facility], [address] [place], [place] [address]    Culture      NO GROWTH 5 DAYS Performed at [facility], [place] [address], [place], [place] [address]    Report Status 01/19/2020 FINAL   Blood Cultures (routine x 2)   Specimen: BLOOD  Result Value Ref Range   Specimen Description      BLOOD RIGHT HAND Performed at [facility], [address] [place],  [place] [address]    Special Requests      BOTTLES DRAWN AEROBIC AND ANAEROBIC Blood Culture adequate volume Performed at [facility], [address] [place], [place] [address]    Culture      NO GROWTH 5 DAYS Performed at [facility], [place] [address] [place], [place] [address]    Report Status 01/19/2020 FINAL   Respiratory Panel by RT PCR (Flu A&B, Covid) - Urine, Clean Catch   Specimen: Urine, Clean Catch  Result Value Ref Range   SARS Coronavirus 2 by RT PCR NEGATIVE NEGATIVE   Influenza A by PCR NEGATIVE NEGATIVE   Influenza B by PCR NEGATIVE NEGATIVE  Urine culture   Specimen: In/Out Cath Urine  Result Value Ref Range   Specimen Description      IN/OUT CATH URINE Performed at [facility], [address] [place], [place] [address]    Special Requests      NONE Performed at [facility], [address] [place], [place] [address]    Culture      NO GROWTH Performed at [facility], [place] [address], [place], [place] [address]    Report Status 01/15/2020 FINAL   MRSA PCR Screening   Specimen: Nasal Mucosa; Nasopharyngeal  Result Value Ref Range   MRSA by PCR NEGATIVE NEGATIVE  Surgical pcr screen   Specimen: Nasal Mucosa; Nasal Swab  Result Value Ref Range   MRSA, PCR NEGATIVE NEGATIVE   Staphylococcus aureus NEGATIVE NEGATIVE  Culture, blood (Routine X  2) w Reflex to ID Panel   Specimen: BLOOD RIGHT HAND  Result Value Ref Range   Specimen Description BLOOD RIGHT HAND    Special Requests      BOTTLES DRAWN AEROBIC ONLY Blood Culture adequate volume   Culture      NO GROWTH 5 DAYS Performed at [facility], [place] [address], [place], [place] [address]    Report Status 01/25/2020 FINAL   Culture, blood (Routine X 2) w Reflex to ID Panel   Specimen: BLOOD LEFT HAND  Result Value Ref Range   Specimen Description BLOOD LEFT HAND    Special Requests      BOTTLES DRAWN AEROBIC ONLY Blood Culture adequate volume    Culture      NO GROWTH 5 DAYS Performed at [facility], [place] [address] [place], [place] [address]    Report Status 01/25/2020 FINAL   Comprehensive metabolic panel  Result Value Ref Range   Sodium 139 135 - 145 mmol/L   Potassium 3.9 3.5 - 5.1 mmol/L   Chloride 101 98 - 111 mmol/L   CO2 20 (L) 22 - 32 mmol/L   Glucose, Bld 343 (H) 70 - 99 mg/dL   BUN 27 (H) 8 - 23 mg/dL   Creatinine, Ser 1.13 0.61 - 1.24 mg/dL   Calcium 9.7 8.9 - 10.3 mg/dL   Total Protein 8.8 (H) 6.5 - 8.1 g/dL   Albumin 5.2 (H) 3.5 - 5.0 g/dL   AST 28 15 - 41 U/L   ALT 41 0 - 44 U/L   Alkaline Phosphatase 44 38 - 126 U/L   Total Bilirubin 1.3 (H) 0.3 - 1.2 mg/dL   GFR calc non Af Amer >60 >60 mL/min   GFR calc Af Amer >60 >60 mL/min   Anion gap 18 (H) 5 - 15  CBC WITH DIFFERENTIAL  Result Value Ref Range   WBC 14.6 (H) 4.0 - 10.5 K/uL   RBC 5.47 4.22 - 5.81 MIL/uL   Hemoglobin 15.9 13.0 - 17.0 g/dL   HCT 46.7 39.0 - 52.0 %   MCV 85.4 80.0 - 100.0 fL   MCH 29.1 26.0 - 34.0 pg   MCHC 34.0 30.0 - 36.0 g/dL   RDW 12.8 11.5 - 15.5 %   Platelets 232 150 - 400 K/uL   nRBC 0.0 0.0 - 0.2 %   Neutrophils Relative % 94 %   Neutro Abs 13.7 (H) 1.7 - 7.7 K/uL   Lymphocytes Relative 2 %   Lymphs Abs 0.4 (L) 0.7 - 4.0 K/uL   Monocytes Relative 3 %   Monocytes Absolute 0.5 0.1 - 1.0 K/uL   Eosinophils Relative 0 %   Eosinophils Absolute 0.0 0.0 - 0.5 K/uL   Basophils Relative 0 %   Basophils Absolute 0.0 0.0 - 0.1 K/uL   Immature Granulocytes 1 %   Abs Immature Granulocytes 0.07 0.00 - 0.07 K/uL  Urinalysis, Complete w Microscopic  Result Value Ref Range   Color, Urine YELLOW YELLOW   APPearance CLEAR CLEAR   Specific Gravity, Urine 1.015 1.005 - 1.030   pH 6.0 5.0 - 8.0   Glucose, UA >=500 (A) NEGATIVE mg/dL   Hgb urine dipstick TRACE (A) NEGATIVE   Bilirubin Urine NEGATIVE NEGATIVE   Ketones, ur 15 (A) NEGATIVE mg/dL   Protein, ur 100 (A) NEGATIVE mg/dL   Nitrite NEGATIVE NEGATIVE    Leukocytes,Ua NEGATIVE NEGATIVE   Squamous Epithelial / LPF 0-5 0 - 5   WBC, UA  0-5 0 - 5 WBC/hpf   RBC / HPF 0-5 0 - 5 RBC/hpf   Bacteria, UA FEW (A) NONE SEEN  Ammonia  Result Value Ref Range   Ammonia 17 9 - 35 umol/L  Lactic acid, plasma  Result Value Ref Range   Lactic Acid, Venous 3.2 (HH) 0.5 - 1.9 mmol/L  APTT  Result Value Ref Range   aPTT 27 24 - 36 seconds  Protime-INR  Result Value Ref Range   Prothrombin Time 14.1 11.4 - 15.2 seconds   INR 1.1 0.8 - 1.2  Lactic acid, plasma  Result Value Ref Range   Lactic Acid, Venous 2.7 (HH) 0.5 - 1.9 mmol/L  HIV Antibody (routine testing w rflx)  Result Value Ref Range   HIV Screen 4th Generation wRfx NON REACTIVE NON REACTIVE  Hemoglobin A1c  Result Value Ref Range   Hgb A1c MFr Bld 7.7 (H) 4.8 - 5.6 %   Mean Plasma Glucose 174.29 mg/dL  Basic metabolic panel  Result Value Ref Range   Sodium 145 135 - 145 mmol/L   Potassium 3.8 3.5 - 5.1 mmol/L   Chloride 112 (H) 98 - 111 mmol/L   CO2 23 22 - 32 mmol/L   Glucose, Bld 238 (H) 70 - 99 mg/dL   BUN 35 (H) 8 - 23 mg/dL   Creatinine, Ser 1.18 0.61 - 1.24 mg/dL   Calcium 9.5 8.9 - 10.3 mg/dL   GFR calc non Af Amer >60 >60 mL/min   GFR calc Af Amer >60 >60 mL/min   Anion gap 10 5 - 15  CBC  Result Value Ref Range   WBC 12.1 (H) 4.0 - 10.5 K/uL   RBC 5.09 4.22 - 5.81 MIL/uL   Hemoglobin 15.2 13.0 - 17.0 g/dL   HCT 45.6 39.0 - 52.0 %   MCV 89.6 80.0 - 100.0 fL   MCH 29.9 26.0 - 34.0 pg   MCHC 33.3 30.0 - 36.0 g/dL   RDW 13.4 11.5 - 15.5 %   Platelets 256 150 - 400 K/uL   nRBC 0.0 0.0 - 0.2 %  Magnesium  Result Value Ref Range   Magnesium 2.2 1.7 - 2.4 mg/dL  Phosphorus  Result Value Ref Range   Phosphorus 1.4 (L) 2.5 - 4.6 mg/dL  Glucose, capillary  Result Value Ref Range   Glucose-Capillary 369 (H) 70 - 99 mg/dL   Comment 1 Notify RN    Comment 2 Document in Chart   Glucose, capillary  Result Value Ref Range   Glucose-Capillary 419 (H) 70 - 99 mg/dL   Glucose, capillary  Result Value Ref Range   Glucose-Capillary 302 (H) 70 - 99 mg/dL  Glucose, capillary  Result Value Ref Range   Glucose-Capillary 223 (H) 70 - 99 mg/dL  Glucose, capillary  Result Value Ref Range   Glucose-Capillary 287 (H) 70 - 99 mg/dL   Comment 1 Notify RN    Comment 2 Document in Chart   Glucose, capillary  Result Value Ref Range   Glucose-Capillary 324 (H) 70 - 99 mg/dL   Comment 1 Notify RN    Comment 2 Document in Chart   Glucose, capillary  Result Value Ref Range   Glucose-Capillary 337 (H) 70 - 99 mg/dL  Glucose, capillary  Result Value Ref Range   Glucose-Capillary 303 (H) 70 - 99 mg/dL  Glucose, capillary  Result Value Ref Range   Glucose-Capillary 277 (H) 70 - 99 mg/dL   Comment 1 Notify RN    Comment 2  Document in Chart   Phosphorus  Result Value Ref Range   Phosphorus 3.6 2.5 - 4.6 mg/dL  Basic metabolic panel  Result Value Ref Range   Sodium 143 135 - 145 mmol/L   Potassium 4.3 3.5 - 5.1 mmol/L   Chloride 109 98 - 111 mmol/L   CO2 22 22 - 32 mmol/L   Glucose, Bld 135 (H) 70 - 99 mg/dL   BUN 41 (H) 8 - 23 mg/dL   Creatinine, Ser 1.00 0.61 - 1.24 mg/dL   Calcium 8.9 8.9 - 10.3 mg/dL   GFR calc non Af Amer >60 >60 mL/min   GFR calc Af Amer >60 >60 mL/min   Anion gap 12 5 - 15  CBC  Result Value Ref Range   WBC 23.8 (H) 4.0 - 10.5 K/uL   RBC 4.79 4.22 - 5.81 MIL/uL   Hemoglobin 14.2 13.0 - 17.0 g/dL   HCT 43.4 39.0 - 52.0 %   MCV 90.6 80.0 - 100.0 fL   MCH 29.6 26.0 - 34.0 pg   MCHC 32.7 30.0 - 36.0 g/dL   RDW 13.8 11.5 - 15.5 %   Platelets 231 150 - 400 K/uL   nRBC 0.0 0.0 - 0.2 %  Magnesium  Result Value Ref Range   Magnesium 2.1 1.7 - 2.4 mg/dL  Glucose, capillary  Result Value Ref Range   Glucose-Capillary 353 (H) 70 - 99 mg/dL  Glucose, capillary  Result Value Ref Range   Glucose-Capillary 314 (H) 70 - 99 mg/dL  Glucose, capillary  Result Value Ref Range   Glucose-Capillary 177 (H) 70 - 99 mg/dL  Triglycerides   Result Value Ref Range   Triglycerides 448 (H) <150 mg/dL  Glucose, capillary  Result Value Ref Range   Glucose-Capillary 167 (H) 70 - 99 mg/dL   Comment 1 Notify RN    Comment 2 Document in Chart   Glucose, capillary  Result Value Ref Range   Glucose-Capillary 201 (H) 70 - 99 mg/dL   Comment 1 Notify RN    Comment 2 Document in Chart   Glucose, capillary  Result Value Ref Range   Glucose-Capillary 176 (H) 70 - 99 mg/dL   Comment 1 Notify RN    Comment 2 Document in Chart   Basic metabolic panel  Result Value Ref Range   Sodium 141 135 - 145 mmol/L   Potassium 4.0 3.5 - 5.1 mmol/L   Chloride 107 98 - 111 mmol/L   CO2 25 22 - 32 mmol/L   Glucose, Bld 182 (H) 70 - 99 mg/dL   BUN 23 8 - 23 mg/dL   Creatinine, Ser 0.76 0.61 - 1.24 mg/dL   Calcium 8.3 (L) 8.9 - 10.3 mg/dL   GFR calc non Af Amer >60 >60 mL/min   GFR calc Af Amer >60 >60 mL/min   Anion gap 9 5 - 15  CBC  Result Value Ref Range   WBC 17.2 (H) 4.0 - 10.5 K/uL   RBC 4.87 4.22 - 5.81 MIL/uL   Hemoglobin 14.2 13.0 - 17.0 g/dL   HCT 44.1 39.0 - 52.0 %   MCV 90.6 80.0 - 100.0 fL   MCH 29.2 26.0 - 34.0 pg   MCHC 32.2 30.0 - 36.0 g/dL   RDW 13.7 11.5 - 15.5 %   Platelets 196 150 - 400 K/uL   nRBC 0.0 0.0 - 0.2 %  Magnesium  Result Value Ref Range   Magnesium 1.8 1.7 - 2.4 mg/dL  Phosphorus  Result Value  Ref Range   Phosphorus 2.8 2.5 - 4.6 mg/dL  Glucose, capillary  Result Value Ref Range   Glucose-Capillary 189 (H) 70 - 99 mg/dL  Glucose, capillary  Result Value Ref Range   Glucose-Capillary 228 (H) 70 - 99 mg/dL  Glucose, capillary  Result Value Ref Range   Glucose-Capillary 189 (H) 70 - 99 mg/dL  Triglycerides  Result Value Ref Range   Triglycerides 413 (H) <150 mg/dL  Glucose, capillary  Result Value Ref Range   Glucose-Capillary 183 (H) 70 - 99 mg/dL  Glucose, capillary  Result Value Ref Range   Glucose-Capillary 188 (H) 70 - 99 mg/dL  Glucose, capillary  Result Value Ref Range    Glucose-Capillary 216 (H) 70 - 99 mg/dL  Glucose, capillary  Result Value Ref Range   Glucose-Capillary 208 (H) 70 - 99 mg/dL  Glucose, capillary  Result Value Ref Range   Glucose-Capillary 243 (H) 70 - 99 mg/dL  Cooxemetry Panel (carboxy, met, total hgb, O2 sat)  Result Value Ref Range   Total hemoglobin 13.4 12.0 - 16.0 g/dL   O2 Saturation 98.5 %   Carboxyhemoglobin 1.4 0.5 - 1.5 %   Methemoglobin 0.4 0.0 - 1.5 %  Glucose, capillary  Result Value Ref Range   Glucose-Capillary 168 (H) 70 - 99 mg/dL  Glucose, capillary  Result Value Ref Range   Glucose-Capillary 167 (H) 70 - 99 mg/dL  Basic metabolic panel  Result Value Ref Range   Sodium 143 135 - 145 mmol/L   Potassium 3.6 3.5 - 5.1 mmol/L   Chloride 106 98 - 111 mmol/L   CO2 27 22 - 32 mmol/L   Glucose, Bld 209 (H) 70 - 99 mg/dL   BUN 14 8 - 23 mg/dL   Creatinine, Ser 0.80 0.61 - 1.24 mg/dL   Calcium 8.6 (L) 8.9 - 10.3 mg/dL   GFR calc non Af Amer >60 >60 mL/min   GFR calc Af Amer >60 >60 mL/min   Anion gap 10 5 - 15  Cooxemetry Panel (carboxy, met, total hgb, O2 sat)  Result Value Ref Range   Total hemoglobin 13.1 12.0 - 16.0 g/dL   O2 Saturation 96.9 %   Carboxyhemoglobin 1.6 (H) 0.5 - 1.5 %   Methemoglobin 0.8 0.0 - 1.5 %  Glucose, capillary  Result Value Ref Range   Glucose-Capillary 183 (H) 70 - 99 mg/dL  Lipid panel  Result Value Ref Range   Cholesterol 88 0 - 200 mg/dL   Triglycerides 124 <150 mg/dL   HDL 26 (L) >40 mg/dL   Total CHOL/HDL Ratio 3.4 RATIO   VLDL 25 0 - 40 mg/dL   LDL Cholesterol 37 0 - 99 mg/dL  CBC  Result Value Ref Range   WBC 14.8 (H) 4.0 - 10.5 K/uL   RBC 4.44 4.22 - 5.81 MIL/uL   Hemoglobin 13.0 13.0 - 17.0 g/dL   HCT 40.2 39.0 - 52.0 %   MCV 90.5 80.0 - 100.0 fL   MCH 29.3 26.0 - 34.0 pg   MCHC 32.3 30.0 - 36.0 g/dL   RDW 13.4 11.5 - 15.5 %   Platelets 224 150 - 400 K/uL   nRBC 0.0 0.0 - 0.2 %  Rapid urine drug screen (hospital performed)  Result Value Ref Range   Opiates  NONE DETECTED NONE DETECTED   Cocaine NONE DETECTED NONE DETECTED   Benzodiazepines NONE DETECTED NONE DETECTED   Amphetamines NONE DETECTED NONE DETECTED   Tetrahydrocannabinol POSITIVE (A) NONE DETECTED   Barbiturates NONE  DETECTED NONE DETECTED  Glucose, capillary  Result Value Ref Range   Glucose-Capillary 182 (H) 70 - 99 mg/dL  Glucose, capillary  Result Value Ref Range   Glucose-Capillary 177 (H) 70 - 99 mg/dL  Glucose, capillary  Result Value Ref Range   Glucose-Capillary 182 (H) 70 - 99 mg/dL  Glucose, capillary  Result Value Ref Range   Glucose-Capillary 231 (H) 70 - 99 mg/dL  Osmolality, urine  Result Value Ref Range   Osmolality, Ur 377 300 - 900 mOsm/kg  Basic metabolic panel  Result Value Ref Range   Sodium 145 135 - 145 mmol/L   Potassium 3.2 (L) 3.5 - 5.1 mmol/L   Chloride 104 98 - 111 mmol/L   CO2 29 22 - 32 mmol/L   Glucose, Bld 228 (H) 70 - 99 mg/dL   BUN 12 8 - 23 mg/dL   Creatinine, Ser 0.87 0.61 - 1.24 mg/dL   Calcium 8.7 (L) 8.9 - 10.3 mg/dL   GFR calc non Af Amer >60 >60 mL/min   GFR calc Af Amer >60 >60 mL/min   Anion gap 12 5 - 15  Urinalysis, Complete w Microscopic  Result Value Ref Range   Color, Urine STRAW (A) YELLOW   APPearance CLEAR CLEAR   Specific Gravity, Urine 1.008 1.005 - 1.030   pH 7.0 5.0 - 8.0   Glucose, UA >=500 (A) NEGATIVE mg/dL   Hgb urine dipstick NEGATIVE NEGATIVE   Bilirubin Urine NEGATIVE NEGATIVE   Ketones, ur NEGATIVE NEGATIVE mg/dL   Protein, ur 30 (A) NEGATIVE mg/dL   Nitrite NEGATIVE NEGATIVE   Leukocytes,Ua NEGATIVE NEGATIVE   RBC / HPF 0-5 0 - 5 RBC/hpf   WBC, UA 0-5 0 - 5 WBC/hpf   Bacteria, UA NONE SEEN NONE SEEN   Squamous Epithelial / LPF 0-5 0 - 5   Mucus PRESENT   Glucose, capillary  Result Value Ref Range   Glucose-Capillary 273 (H) 70 - 99 mg/dL  Glucose, capillary  Result Value Ref Range   Glucose-Capillary 281 (H) 70 - 99 mg/dL  Basic metabolic panel  Result Value Ref Range   Sodium 141 135 -  145 mmol/L   Potassium 3.5 3.5 - 5.1 mmol/L   Chloride 102 98 - 111 mmol/L   CO2 28 22 - 32 mmol/L   Glucose, Bld 342 (H) 70 - 99 mg/dL   BUN 12 8 - 23 mg/dL   Creatinine, Ser 0.81 0.61 - 1.24 mg/dL   Calcium 8.9 8.9 - 10.3 mg/dL   GFR calc non Af Amer >60 >60 mL/min   GFR calc Af Amer >60 >60 mL/min   Anion gap 11 5 - 15  Cooxemetry Panel (carboxy, met, total hgb, O2 sat)  Result Value Ref Range   Total hemoglobin 13.9 12.0 - 16.0 g/dL   O2 Saturation 96.1 %   Carboxyhemoglobin 1.4 0.5 - 1.5 %   Methemoglobin 0.8 0.0 - 1.5 %  CBC  Result Value Ref Range   WBC 18.5 (H) 4.0 - 10.5 K/uL   RBC 4.50 4.22 - 5.81 MIL/uL   Hemoglobin 13.2 13.0 - 17.0 g/dL   HCT 40.6 39.0 - 52.0 %   MCV 90.2 80.0 - 100.0 fL   MCH 29.3 26.0 - 34.0 pg   MCHC 32.5 30.0 - 36.0 g/dL   RDW 13.2 11.5 - 15.5 %   Platelets 218 150 - 400 K/uL   nRBC 0.0 0.0 - 0.2 %  Glucose, capillary  Result Value Ref Range  Glucose-Capillary 258 (H) 70 - 99 mg/dL  Glucose, capillary  Result Value Ref Range   Glucose-Capillary 297 (H) 70 - 99 mg/dL  Magnesium  Result Value Ref Range   Magnesium 1.9 1.7 - 2.4 mg/dL  Phosphorus  Result Value Ref Range   Phosphorus 1.2 (L) 2.5 - 4.6 mg/dL  Glucose, capillary  Result Value Ref Range   Glucose-Capillary 354 (H) 70 - 99 mg/dL  Osmolality, urine  Result Value Ref Range   Osmolality, Ur 408 300 - 900 mOsm/kg  Glucose, capillary  Result Value Ref Range   Glucose-Capillary 266 (H) 70 - 99 mg/dL  Glucose, capillary  Result Value Ref Range   Glucose-Capillary 228 (H) 70 - 99 mg/dL  Glucose, capillary  Result Value Ref Range   Glucose-Capillary 148 (H) 70 - 99 mg/dL  Basic metabolic panel  Result Value Ref Range   Sodium 138 135 - 145 mmol/L   Potassium 3.5 3.5 - 5.1 mmol/L   Chloride 102 98 - 111 mmol/L   CO2 26 22 - 32 mmol/L   Glucose, Bld 254 (H) 70 - 99 mg/dL   BUN 17 8 - 23 mg/dL   Creatinine, Ser 0.82 0.61 - 1.24 mg/dL   Calcium 8.5 (L) 8.9 - 10.3 mg/dL    GFR calc non Af Amer >60 >60 mL/min   GFR calc Af Amer >60 >60 mL/min   Anion gap 10 5 - 15  Cooxemetry Panel (carboxy, met, total hgb, O2 sat)  Result Value Ref Range   Total hemoglobin 15.7 12.0 - 16.0 g/dL   O2 Saturation 79.7 %   Carboxyhemoglobin 1.4 0.5 - 1.5 %   Methemoglobin 0.7 0.0 - 1.5 %  CBC  Result Value Ref Range   WBC 20.5 (H) 4.0 - 10.5 K/uL   RBC 5.38 4.22 - 5.81 MIL/uL   Hemoglobin 15.5 13.0 - 17.0 g/dL   HCT 47.3 39.0 - 52.0 %   MCV 87.9 80.0 - 100.0 fL   MCH 28.8 26.0 - 34.0 pg   MCHC 32.8 30.0 - 36.0 g/dL   RDW 13.4 11.5 - 15.5 %   Platelets 214 150 - 400 K/uL   nRBC 0.0 0.0 - 0.2 %  Glucose, capillary  Result Value Ref Range   Glucose-Capillary 194 (H) 70 - 99 mg/dL  Glucose, capillary  Result Value Ref Range   Glucose-Capillary 290 (H) 70 - 99 mg/dL  Glucose, capillary  Result Value Ref Range   Glucose-Capillary 221 (H) 70 - 99 mg/dL  Glucose, capillary  Result Value Ref Range   Glucose-Capillary 246 (H) 70 - 99 mg/dL  Basic metabolic panel  Result Value Ref Range   Sodium 135 135 - 145 mmol/L   Potassium 3.4 (L) 3.5 - 5.1 mmol/L   Chloride 99 98 - 111 mmol/L   CO2 25 22 - 32 mmol/L   Glucose, Bld 186 (H) 70 - 99 mg/dL   BUN 21 8 - 23 mg/dL   Creatinine, Ser 0.80 0.61 - 1.24 mg/dL   Calcium 8.3 (L) 8.9 - 10.3 mg/dL   GFR calc non Af Amer >60 >60 mL/min   GFR calc Af Amer >60 >60 mL/min   Anion gap 11 5 - 15  Magnesium  Result Value Ref Range   Magnesium 1.9 1.7 - 2.4 mg/dL  Procalcitonin - Baseline  Result Value Ref Range   Procalcitonin 0.27 ng/mL  Glucose, capillary  Result Value Ref Range   Glucose-Capillary 270 (H) 70 - 99 mg/dL  Glucose, capillary  Result Value Ref Range   Glucose-Capillary 189 (H) 70 - 99 mg/dL  Basic metabolic panel  Result Value Ref Range   Sodium 137 135 - 145 mmol/L   Potassium 3.7 3.5 - 5.1 mmol/L   Chloride 102 98 - 111 mmol/L   CO2 27 22 - 32 mmol/L   Glucose, Bld 167 (H) 70 - 99 mg/dL   BUN 23 8 -  23 mg/dL   Creatinine, Ser 0.78 0.61 - 1.24 mg/dL   Calcium 8.4 (L) 8.9 - 10.3 mg/dL   GFR calc non Af Amer >60 >60 mL/min   GFR calc Af Amer >60 >60 mL/min   Anion gap 8 5 - 15  Cooxemetry Panel (carboxy, met, total hgb, O2 sat)  Result Value Ref Range   Total hemoglobin 16.3 (H) 12.0 - 16.0 g/dL   O2 Saturation 71.7 %   Carboxyhemoglobin 1.5 0.5 - 1.5 %   Methemoglobin 0.6 0.0 - 1.5 %  Procalcitonin  Result Value Ref Range   Procalcitonin 0.27 ng/mL  CBC with Differential/Platelet  Result Value Ref Range   WBC 20.2 (H) 4.0 - 10.5 K/uL   RBC 5.65 4.22 - 5.81 MIL/uL   Hemoglobin 16.5 13.0 - 17.0 g/dL   HCT 49.3 39.0 - 52.0 %   MCV 87.3 80.0 - 100.0 fL   MCH 29.2 26.0 - 34.0 pg   MCHC 33.5 30.0 - 36.0 g/dL   RDW 13.5 11.5 - 15.5 %   Platelets 164 150 - 400 K/uL   nRBC 0.0 0.0 - 0.2 %   Neutrophils Relative % 77 %   Neutro Abs 15.4 (H) 1.7 - 7.7 K/uL   Lymphocytes Relative 10 %   Lymphs Abs 2.0 0.7 - 4.0 K/uL   Monocytes Relative 12 %   Monocytes Absolute 2.5 (H) 0.1 - 1.0 K/uL   Eosinophils Relative 0 %   Eosinophils Absolute 0.0 0.0 - 0.5 K/uL   Basophils Relative 0 %   Basophils Absolute 0.1 0.0 - 0.1 K/uL   Immature Granulocytes 1 %   Abs Immature Granulocytes 0.20 (H) 0.00 - 0.07 K/uL  Glucose, capillary  Result Value Ref Range   Glucose-Capillary 164 (H) 70 - 99 mg/dL  Glucose, capillary  Result Value Ref Range   Glucose-Capillary 128 (H) 70 - 99 mg/dL  Glucose, capillary  Result Value Ref Range   Glucose-Capillary 149 (H) 70 - 99 mg/dL  Phosphorus  Result Value Ref Range   Phosphorus 2.7 2.5 - 4.6 mg/dL  Glucose, capillary  Result Value Ref Range   Glucose-Capillary 215 (H) 70 - 99 mg/dL   Comment 1 Notify RN    Comment 2 Document in Chart   Magnesium  Result Value Ref Range   Magnesium 1.8 1.7 - 2.4 mg/dL  Glucose, capillary  Result Value Ref Range   Glucose-Capillary 217 (H) 70 - 99 mg/dL   Comment 1 Notify RN    Comment 2 Document in Chart    Glucose, capillary  Result Value Ref Range   Glucose-Capillary 137 (H) 70 - 99 mg/dL   Comment 1 Notify RN    Comment 2 Document in Chart   Basic metabolic panel  Result Value Ref Range   Sodium 135 135 - 145 mmol/L   Potassium 3.3 (L) 3.5 - 5.1 mmol/L   Chloride 97 (L) 98 - 111 mmol/L   CO2 26 22 - 32 mmol/L   Glucose, Bld 178 (H) 70 - 99 mg/dL   BUN 22 8 - 23 mg/dL  Creatinine, Ser 0.73 0.61 - 1.24 mg/dL   Calcium 8.2 (L) 8.9 - 10.3 mg/dL   GFR calc non Af Amer >60 >60 mL/min   GFR calc Af Amer >60 >60 mL/min   Anion gap 12 5 - 15  CBC with Differential/Platelet  Result Value Ref Range   WBC 20.1 (H) 4.0 - 10.5 K/uL   RBC 5.12 4.22 - 5.81 MIL/uL   Hemoglobin 15.2 13.0 - 17.0 g/dL   HCT 44.5 39.0 - 52.0 %   MCV 86.9 80.0 - 100.0 fL   MCH 29.7 26.0 - 34.0 pg   MCHC 34.2 30.0 - 36.0 g/dL   RDW 13.6 11.5 - 15.5 %   Platelets 127 (L) 150 - 400 K/uL   nRBC 0.0 0.0 - 0.2 %   Neutrophils Relative % 80 %   Neutro Abs 15.9 (H) 1.7 - 7.7 K/uL   Lymphocytes Relative 8 %   Lymphs Abs 1.7 0.7 - 4.0 K/uL   Monocytes Relative 11 %   Monocytes Absolute 2.2 (H) 0.1 - 1.0 K/uL   Eosinophils Relative 0 %   Eosinophils Absolute 0.0 0.0 - 0.5 K/uL   Basophils Relative 0 %   Basophils Absolute 0.0 0.0 - 0.1 K/uL   Immature Granulocytes 1 %   Abs Immature Granulocytes 0.22 (H) 0.00 - 0.07 K/uL  Magnesium  Result Value Ref Range   Magnesium 1.8 1.7 - 2.4 mg/dL  Glucose, capillary  Result Value Ref Range   Glucose-Capillary 100 (H) 70 - 99 mg/dL  Glucose, capillary  Result Value Ref Range   Glucose-Capillary 113 (H) 70 - 99 mg/dL  Glucose, capillary  Result Value Ref Range   Glucose-Capillary 152 (H) 70 - 99 mg/dL  Glucose, capillary  Result Value Ref Range   Glucose-Capillary 226 (H) 70 - 99 mg/dL   Comment 1 Notify RN    Comment 2 Document in Chart   Basic metabolic panel  Result Value Ref Range   Sodium 136 135 - 145 mmol/L   Potassium 3.4 (L) 3.5 - 5.1 mmol/L   Chloride  100 98 - 111 mmol/L   CO2 27 22 - 32 mmol/L   Glucose, Bld 193 (H) 70 - 99 mg/dL   BUN 22 8 - 23 mg/dL   Creatinine, Ser 0.79 0.61 - 1.24 mg/dL   Calcium 8.2 (L) 8.9 - 10.3 mg/dL   GFR calc non Af Amer >60 >60 mL/min   GFR calc Af Amer >60 >60 mL/min   Anion gap 9 5 - 15  Heparin induced platelet Ab (HIT antibody)  Result Value Ref Range   Heparin Induced Plt Ab 0.292 0.000 - 0.400 OD  Glucose, capillary  Result Value Ref Range   Glucose-Capillary 242 (H) 70 - 99 mg/dL   Comment 1 Notify RN    Comment 2 Document in Chart   Glucose, capillary  Result Value Ref Range   Glucose-Capillary 180 (H) 70 - 99 mg/dL   Comment 1 Notify RN    Comment 2 Document in Chart   TSH  Result Value Ref Range   TSH 0.736 0.350 - 4.500 uIU/mL  CBC with Differential/Platelet  Result Value Ref Range   WBC 24.3 (H) 4.0 - 10.5 K/uL   RBC 4.70 4.22 - 5.81 MIL/uL   Hemoglobin 13.8 13.0 - 17.0 g/dL   HCT 40.8 39.0 - 52.0 %   MCV 86.8 80.0 - 100.0 fL   MCH 29.4 26.0 - 34.0 pg   MCHC 33.8 30.0 - 36.0  g/dL   RDW 13.6 11.5 - 15.5 %   Platelets 125 (L) 150 - 400 K/uL   nRBC 0.0 0.0 - 0.2 %   Neutrophils Relative % 84 %   Neutro Abs 20.4 (H) 1.7 - 7.7 K/uL   Lymphocytes Relative 7 %   Lymphs Abs 1.6 0.7 - 4.0 K/uL   Monocytes Relative 8 %   Monocytes Absolute 1.9 (H) 0.1 - 1.0 K/uL   Eosinophils Relative 0 %   Eosinophils Absolute 0.0 0.0 - 0.5 K/uL   Basophils Relative 0 %   Basophils Absolute 0.1 0.0 - 0.1 K/uL   Immature Granulocytes 1 %   Abs Immature Granulocytes 0.27 (H) 0.00 - 0.07 K/uL  Magnesium  Result Value Ref Range   Magnesium 1.7 1.7 - 2.4 mg/dL  Glucose, capillary  Result Value Ref Range   Glucose-Capillary 198 (H) 70 - 99 mg/dL  Glucose, capillary  Result Value Ref Range   Glucose-Capillary 159 (H) 70 - 99 mg/dL  Glucose, capillary  Result Value Ref Range   Glucose-Capillary 170 (H) 70 - 99 mg/dL  Renal function panel  Result Value Ref Range   Sodium 136 135 - 145 mmol/L    Potassium 2.9 (L) 3.5 - 5.1 mmol/L   Chloride 100 98 - 111 mmol/L   CO2 28 22 - 32 mmol/L   Glucose, Bld 152 (H) 70 - 99 mg/dL   BUN 15 8 - 23 mg/dL   Creatinine, Ser 0.66 0.61 - 1.24 mg/dL   Calcium 8.2 (L) 8.9 - 10.3 mg/dL   Phosphorus 2.5 2.5 - 4.6 mg/dL   Albumin 2.6 (L) 3.5 - 5.0 g/dL   GFR calc non Af Amer >60 >60 mL/min   GFR calc Af Amer >60 >60 mL/min   Anion gap 8 5 - 15  Glucose, capillary  Result Value Ref Range   Glucose-Capillary 133 (H) 70 - 99 mg/dL  Glucose, capillary  Result Value Ref Range   Glucose-Capillary 231 (H) 70 - 99 mg/dL  Glucose, capillary  Result Value Ref Range   Glucose-Capillary 244 (H) 70 - 99 mg/dL  CBC with Differential/Platelet  Result Value Ref Range   WBC 26.2 (H) 4.0 - 10.5 K/uL   RBC 4.18 (L) 4.22 - 5.81 MIL/uL   Hemoglobin 12.0 (L) 13.0 - 17.0 g/dL   HCT 36.0 (L) 39.0 - 52.0 %   MCV 86.1 80.0 - 100.0 fL   MCH 28.7 26.0 - 34.0 pg   MCHC 33.3 30.0 - 36.0 g/dL   RDW 13.7 11.5 - 15.5 %   Platelets 128 (L) 150 - 400 K/uL   nRBC 0.0 0.0 - 0.2 %   Neutrophils Relative % 89 %   Neutro Abs 23.5 (H) 1.7 - 7.7 K/uL   Lymphocytes Relative 4 %   Lymphs Abs 1.0 0.7 - 4.0 K/uL   Monocytes Relative 6 %   Monocytes Absolute 1.5 (H) 0.1 - 1.0 K/uL   Eosinophils Relative 0 %   Eosinophils Absolute 0.0 0.0 - 0.5 K/uL   Basophils Relative 0 %   Basophils Absolute 0.0 0.0 - 0.1 K/uL   Immature Granulocytes 1 %   Abs Immature Granulocytes 0.25 (H) 0.00 - 0.07 K/uL  Magnesium  Result Value Ref Range   Magnesium 1.7 1.7 - 2.4 mg/dL  Basic metabolic panel  Result Value Ref Range   Sodium 135 135 - 145 mmol/L   Potassium 3.2 (L) 3.5 - 5.1 mmol/L   Chloride 97 (L) 98 - 111 mmol/L  CO2 25 22 - 32 mmol/L   Glucose, Bld 130 (H) 70 - 99 mg/dL   BUN 13 8 - 23 mg/dL   Creatinine, Ser 0.65 0.61 - 1.24 mg/dL   Calcium 8.4 (L) 8.9 - 10.3 mg/dL   GFR calc non Af Amer >60 >60 mL/min   GFR calc Af Amer >60 >60 mL/min   Anion gap 13 5 - 15  Phosphorus   Result Value Ref Range   Phosphorus 2.7 2.5 - 4.6 mg/dL  Basic metabolic panel  Result Value Ref Range   Sodium 134 (L) 135 - 145 mmol/L   Potassium 2.9 (L) 3.5 - 5.1 mmol/L   Chloride 98 98 - 111 mmol/L   CO2 28 22 - 32 mmol/L   Glucose, Bld 231 (H) 70 - 99 mg/dL   BUN 15 8 - 23 mg/dL   Creatinine, Ser 0.78 0.61 - 1.24 mg/dL   Calcium 8.1 (L) 8.9 - 10.3 mg/dL   GFR calc non Af Amer >60 >60 mL/min   GFR calc Af Amer >60 >60 mL/min   Anion gap 8 5 - 15  Magnesium  Result Value Ref Range   Magnesium 1.9 1.7 - 2.4 mg/dL  Glucose, capillary  Result Value Ref Range   Glucose-Capillary 206 (H) 70 - 99 mg/dL  Glucose, capillary  Result Value Ref Range   Glucose-Capillary 209 (H) 70 - 99 mg/dL  Glucose, capillary  Result Value Ref Range   Glucose-Capillary 158 (H) 70 - 99 mg/dL  Glucose, capillary  Result Value Ref Range   Glucose-Capillary 157 (H) 70 - 99 mg/dL  Basic metabolic panel  Result Value Ref Range   Sodium 134 (L) 135 - 145 mmol/L   Potassium 3.4 (L) 3.5 - 5.1 mmol/L   Chloride 97 (L) 98 - 111 mmol/L   CO2 27 22 - 32 mmol/L   Glucose, Bld 138 (H) 70 - 99 mg/dL   BUN 13 8 - 23 mg/dL   Creatinine, Ser 0.75 0.61 - 1.24 mg/dL   Calcium 8.3 (L) 8.9 - 10.3 mg/dL   GFR calc non Af Amer >60 >60 mL/min   GFR calc Af Amer >60 >60 mL/min   Anion gap 10 5 - 15  Calcium, ionized  Result Value Ref Range   Calcium, Ionized, Serum 4.7 4.5 - 5.6 mg/dL  Glucose, capillary  Result Value Ref Range   Glucose-Capillary 249 (H) 70 - 99 mg/dL  Glucose, capillary  Result Value Ref Range   Glucose-Capillary 146 (H) 70 - 99 mg/dL  CBC with Differential/Platelet  Result Value Ref Range   WBC 23.1 (H) 4.0 - 10.5 K/uL   RBC 4.08 (L) 4.22 - 5.81 MIL/uL   Hemoglobin 11.9 (L) 13.0 - 17.0 g/dL   HCT 35.3 (L) 39.0 - 52.0 %   MCV 86.5 80.0 - 100.0 fL   MCH 29.2 26.0 - 34.0 pg   MCHC 33.7 30.0 - 36.0 g/dL   RDW 14.0 11.5 - 15.5 %   Platelets 169 150 - 400 K/uL   nRBC 0.0 0.0 - 0.2 %    Neutrophils Relative % 89 %   Neutro Abs 20.6 (H) 1.7 - 7.7 K/uL   Lymphocytes Relative 5 %   Lymphs Abs 1.1 0.7 - 4.0 K/uL   Monocytes Relative 5 %   Monocytes Absolute 1.2 (H) 0.1 - 1.0 K/uL   Eosinophils Relative 0 %   Eosinophils Absolute 0.0 0.0 - 0.5 K/uL   Basophils Relative 0 %   Basophils Absolute  0.0 0.0 - 0.1 K/uL   Immature Granulocytes 1 %   Abs Immature Granulocytes 0.14 (H) 0.00 - 0.07 K/uL  Magnesium  Result Value Ref Range   Magnesium 2.1 1.7 - 2.4 mg/dL  Glucose, capillary  Result Value Ref Range   Glucose-Capillary 172 (H) 70 - 99 mg/dL  Basic metabolic panel  Result Value Ref Range   Sodium 133 (L) 135 - 145 mmol/L   Potassium 3.5 3.5 - 5.1 mmol/L   Chloride 94 (L) 98 - 111 mmol/L   CO2 28 22 - 32 mmol/L   Glucose, Bld 178 (H) 70 - 99 mg/dL   BUN 9 8 - 23 mg/dL   Creatinine, Ser 0.62 0.61 - 1.24 mg/dL   Calcium 8.0 (L) 8.9 - 10.3 mg/dL   GFR calc non Af Amer >60 >60 mL/min   GFR calc Af Amer >60 >60 mL/min   Anion gap 11 5 - 15  Magnesium  Result Value Ref Range   Magnesium 1.8 1.7 - 2.4 mg/dL  Glucose, capillary  Result Value Ref Range   Glucose-Capillary 188 (H) 70 - 99 mg/dL  Basic metabolic panel  Result Value Ref Range   Sodium 133 (L) 135 - 145 mmol/L   Potassium 3.5 3.5 - 5.1 mmol/L   Chloride 94 (L) 98 - 111 mmol/L   CO2 27 22 - 32 mmol/L   Glucose, Bld 185 (H) 70 - 99 mg/dL   BUN 10 8 - 23 mg/dL   Creatinine, Ser 0.67 0.61 - 1.24 mg/dL   Calcium 8.0 (L) 8.9 - 10.3 mg/dL   GFR calc non Af Amer >60 >60 mL/min   GFR calc Af Amer >60 >60 mL/min   Anion gap 12 5 - 15  Glucose, capillary  Result Value Ref Range   Glucose-Capillary 194 (H) 70 - 99 mg/dL  Basic metabolic panel  Result Value Ref Range   Sodium 132 (L) 135 - 145 mmol/L   Potassium 3.5 3.5 - 5.1 mmol/L   Chloride 95 (L) 98 - 111 mmol/L   CO2 28 22 - 32 mmol/L   Glucose, Bld 125 (H) 70 - 99 mg/dL   BUN 8 8 - 23 mg/dL   Creatinine, Ser 0.57 (L) 0.61 - 1.24 mg/dL   Calcium  8.0 (L) 8.9 - 10.3 mg/dL   GFR calc non Af Amer >60 >60 mL/min   GFR calc Af Amer >60 >60 mL/min   Anion gap 9 5 - 15  Glucose, capillary  Result Value Ref Range   Glucose-Capillary 198 (H) 70 - 99 mg/dL  Glucose, capillary  Result Value Ref Range   Glucose-Capillary 169 (H) 70 - 99 mg/dL  Glucose, capillary  Result Value Ref Range   Glucose-Capillary 124 (H) 70 - 99 mg/dL  Glucose, capillary  Result Value Ref Range   Glucose-Capillary 92 70 - 99 mg/dL  Glucose, capillary  Result Value Ref Range   Glucose-Capillary 122 (H) 70 - 99 mg/dL  Glucose, capillary  Result Value Ref Range   Glucose-Capillary 128 (H) 70 - 99 mg/dL  Basic metabolic panel  Result Value Ref Range   Sodium 133 (L) 135 - 145 mmol/L   Potassium 3.8 3.5 - 5.1 mmol/L   Chloride 96 (L) 98 - 111 mmol/L   CO2 27 22 - 32 mmol/L   Glucose, Bld 158 (H) 70 - 99 mg/dL   BUN 11 8 - 23 mg/dL   Creatinine, Ser 0.56 (L) 0.61 - 1.24 mg/dL   Calcium 8.2 (L)  8.9 - 10.3 mg/dL   GFR calc non Af Amer >60 >60 mL/min   GFR calc Af Amer >60 >60 mL/min   Anion gap 10 5 - 15  Magnesium  Result Value Ref Range   Magnesium 1.9 1.7 - 2.4 mg/dL  Glucose, capillary  Result Value Ref Range   Glucose-Capillary 186 (H) 70 - 99 mg/dL  Glucose, capillary  Result Value Ref Range   Glucose-Capillary 190 (H) 70 - 99 mg/dL  Glucose, capillary  Result Value Ref Range   Glucose-Capillary 160 (H) 70 - 99 mg/dL  Glucose, capillary  Result Value Ref Range   Glucose-Capillary 133 (H) 70 - 99 mg/dL  Glucose, capillary  Result Value Ref Range   Glucose-Capillary 168 (H) 70 - 99 mg/dL  Glucose, capillary  Result Value Ref Range   Glucose-Capillary 116 (H) 70 - 99 mg/dL  Glucose, capillary  Result Value Ref Range   Glucose-Capillary 110 (H) 70 - 99 mg/dL   Comment 1 Notify RN    Comment 2 Document in Chart   CBC  Result Value Ref Range   WBC 19.5 (H) 4.0 - 10.5 K/uL   RBC 3.69 (L) 4.22 - 5.81 MIL/uL   Hemoglobin 10.8 (L) 13.0 - 17.0  g/dL   HCT 32.2 (L) 39.0 - 52.0 %   MCV 87.3 80.0 - 100.0 fL   MCH 29.3 26.0 - 34.0 pg   MCHC 33.5 30.0 - 36.0 g/dL   RDW 14.4 11.5 - 15.5 %   Platelets 262 150 - 400 K/uL   nRBC 0.0 0.0 - 0.2 %  Basic metabolic panel  Result Value Ref Range   Sodium 134 (L) 135 - 145 mmol/L   Potassium 3.6 3.5 - 5.1 mmol/L   Chloride 97 (L) 98 - 111 mmol/L   CO2 27 22 - 32 mmol/L   Glucose, Bld 122 (H) 70 - 99 mg/dL   BUN 11 8 - 23 mg/dL   Creatinine, Ser 0.64 0.61 - 1.24 mg/dL   Calcium 8.5 (L) 8.9 - 10.3 mg/dL   GFR calc non Af Amer >60 >60 mL/min   GFR calc Af Amer >60 >60 mL/min   Anion gap 10 5 - 15  Glucose, capillary  Result Value Ref Range   Glucose-Capillary 107 (H) 70 - 99 mg/dL   Comment 1 Notify RN    Comment 2 Document in Chart   Glucose, capillary  Result Value Ref Range   Glucose-Capillary 108 (H) 70 - 99 mg/dL   Comment 1 Notify RN    Comment 2 Document in Chart   CBC  Result Value Ref Range   WBC 14.3 (H) 4.0 - 10.5 K/uL   RBC 3.80 (L) 4.22 - 5.81 MIL/uL   Hemoglobin 10.9 (L) 13.0 - 17.0 g/dL   HCT 33.1 (L) 39.0 - 52.0 %   MCV 87.1 80.0 - 100.0 fL   MCH 28.7 26.0 - 34.0 pg   MCHC 32.9 30.0 - 36.0 g/dL   RDW 14.4 11.5 - 15.5 %   Platelets 277 150 - 400 K/uL   nRBC 0.0 0.0 - 0.2 %  Basic metabolic panel  Result Value Ref Range   Sodium 138 135 - 145 mmol/L   Potassium 3.5 3.5 - 5.1 mmol/L   Chloride 98 98 - 111 mmol/L   CO2 28 22 - 32 mmol/L   Glucose, Bld 72 70 - 99 mg/dL   BUN 10 8 - 23 mg/dL   Creatinine, Ser 0.64 0.61 - 1.24 mg/dL  Calcium 8.7 (L) 8.9 - 10.3 mg/dL   GFR calc non Af Amer >60 >60 mL/min   GFR calc Af Amer >60 >60 mL/min   Anion gap 12 5 - 15  Magnesium  Result Value Ref Range   Magnesium 1.7 1.7 - 2.4 mg/dL  Phosphorus  Result Value Ref Range   Phosphorus 4.0 2.5 - 4.6 mg/dL  Glucose, capillary  Result Value Ref Range   Glucose-Capillary 103 (H) 70 - 99 mg/dL  Glucose, capillary  Result Value Ref Range   Glucose-Capillary 121 (H)  70 - 99 mg/dL  Glucose, capillary  Result Value Ref Range   Glucose-Capillary 85 70 - 99 mg/dL  Glucose, capillary  Result Value Ref Range   Glucose-Capillary 85 70 - 99 mg/dL  Glucose, capillary  Result Value Ref Range   Glucose-Capillary 112 (H) 70 - 99 mg/dL   Comment 1 Notify RN    Comment 2 Document in Chart   Glucose, capillary  Result Value Ref Range   Glucose-Capillary 179 (H) 70 - 99 mg/dL   Comment 1 Notify RN    Comment 2 Document in Chart   CBC  Result Value Ref Range   WBC 11.7 (H) 4.0 - 10.5 K/uL   RBC 3.75 (L) 4.22 - 5.81 MIL/uL   Hemoglobin 10.9 (L) 13.0 - 17.0 g/dL   HCT 32.9 (L) 39.0 - 52.0 %   MCV 87.7 80.0 - 100.0 fL   MCH 29.1 26.0 - 34.0 pg   MCHC 33.1 30.0 - 36.0 g/dL   RDW 14.3 11.5 - 15.5 %   Platelets 316 150 - 400 K/uL   nRBC 0.0 0.0 - 0.2 %  Basic metabolic panel  Result Value Ref Range   Sodium 135 135 - 145 mmol/L   Potassium 3.8 3.5 - 5.1 mmol/L   Chloride 97 (L) 98 - 111 mmol/L   CO2 29 22 - 32 mmol/L   Glucose, Bld 151 (H) 70 - 99 mg/dL   BUN 9 8 - 23 mg/dL   Creatinine, Ser 0.70 0.61 - 1.24 mg/dL   Calcium 8.4 (L) 8.9 - 10.3 mg/dL   GFR calc non Af Amer >60 >60 mL/min   GFR calc Af Amer >60 >60 mL/min   Anion gap 9 5 - 15  Magnesium  Result Value Ref Range   Magnesium 1.9 1.7 - 2.4 mg/dL  Phosphorus  Result Value Ref Range   Phosphorus 3.6 2.5 - 4.6 mg/dL  Glucose, capillary  Result Value Ref Range   Glucose-Capillary 321 (H) 70 - 99 mg/dL  Glucose, capillary  Result Value Ref Range   Glucose-Capillary 94 70 - 99 mg/dL  Glucose, capillary  Result Value Ref Range   Glucose-Capillary 60 (L) 70 - 99 mg/dL  Glucose, capillary  Result Value Ref Range   Glucose-Capillary 152 (H) 70 - 99 mg/dL  Glucose, capillary  Result Value Ref Range   Glucose-Capillary 136 (H) 70 - 99 mg/dL   Comment 1 Notify RN    Comment 2 Document in Chart   Glucose, capillary  Result Value Ref Range   Glucose-Capillary 48 (L) 70 - 99 mg/dL    Comment 1 Notify RN    Comment 2 Document in Chart   Glucose, capillary  Result Value Ref Range   Glucose-Capillary 127 (H) 70 - 99 mg/dL   Comment 1 Notify RN    Comment 2 Document in Chart   Glucose, capillary  Result Value Ref Range   Glucose-Capillary 42 (LL) 70 -  99 mg/dL   Comment 1 Repeat Test   Glucose, capillary  Result Value Ref Range   Glucose-Capillary 80 70 - 99 mg/dL  Glucose, capillary  Result Value Ref Range   Glucose-Capillary 177 (H) 70 - 99 mg/dL  Glucose, capillary  Result Value Ref Range   Glucose-Capillary 149 (H) 70 - 99 mg/dL  Glucose, capillary  Result Value Ref Range   Glucose-Capillary 125 (H) 70 - 99 mg/dL  Glucose, capillary  Result Value Ref Range   Glucose-Capillary 109 (H) 70 - 99 mg/dL  Glucose, capillary  Result Value Ref Range   Glucose-Capillary 121 (H) 70 - 99 mg/dL  Glucose, capillary  Result Value Ref Range   Glucose-Capillary 103 (H) 70 - 99 mg/dL  Glucose, capillary  Result Value Ref Range   Glucose-Capillary 124 (H) 70 - 99 mg/dL  Glucose, capillary  Result Value Ref Range   Glucose-Capillary 258 (H) 70 - 99 mg/dL  Glucose, capillary  Result Value Ref Range   Glucose-Capillary 222 (H) 70 - 99 mg/dL  Glucose, capillary  Result Value Ref Range   Glucose-Capillary 182 (H) 70 - 99 mg/dL  Glucose, capillary  Result Value Ref Range   Glucose-Capillary 161 (H) 70 - 99 mg/dL  Glucose, capillary  Result Value Ref Range   Glucose-Capillary 87 70 - 99 mg/dL  Glucose, capillary  Result Value Ref Range   Glucose-Capillary 135 (H) 70 - 99 mg/dL  CBG monitoring, ED  Result Value Ref Range   Glucose-Capillary 324 (H) 70 - 99 mg/dL   Comment 1 Notify RN   POCT I-Stat EG7  Result Value Ref Range   pH, Ven 7.442 (H) 7.250 - 7.430   pCO2, Ven 33.5 (L) 44.0 - 60.0 mmHg   pO2, Ven 55.0 (H) 32.0 - 45.0 mmHg   Bicarbonate 22.9 20.0 - 28.0 mmol/L   TCO2 24 22 - 32 mmol/L   O2 Saturation 90.0 %   Acid-Base Excess 0.0 0.0 - 2.0 mmol/L    Sodium 141 135 - 145 mmol/L   Potassium 3.9 3.5 - 5.1 mmol/L   Calcium, Ion 1.21 1.15 - 1.40 mmol/L   HCT 49.0 39.0 - 52.0 %   Hemoglobin 16.7 13.0 - 17.0 g/dL   Sample type VENOUS   ECHOCARDIOGRAM COMPLETE  Result Value Ref Range   Weight 3,200 oz   Height 72 in   BP 146/72 mmHg    CT ANGIO HEAD W OR WO CONTRAST  Result Date: 01/18/2020 CLINICAL DATA:  Vasospasm, subarachnoid hemorrhage EXAM: CT ANGIOGRAPHY HEAD TECHNIQUE: Multidetector CT imaging of the head was performed using the standard protocol during bolus administration of intravenous contrast. Multiplanar CT image reconstructions and MIPs were obtained to evaluate the vascular anatomy. CONTRAST:  15m OMNIPAQUE IOHEXOL 350 MG/ML SOLN COMPARISON:  01/15/2020 FINDINGS: CT HEAD Brain: Subarachnoid hemorrhage is present within the basal cisterns eccentric to the right extending along the interhemispheric fissure and right sylvian fissure. There is some sulcal subarachnoid hemorrhage as well as hemorrhage within the cisterna magna. New hypoattenuating left subdural collection is present measuring 4 mm in thickness. Extra-axial hyperdense hemorrhage is present along the right tentorium. Overall volume of hyperdense hemorrhage has decreased since the prior study. Ventricles have decreased in size and there is no hydrocephalus. New mild rightward midline shift measuring 6 mm. No new loss of gray-white differentiation. Vascular: There is intracranial atherosclerotic calcification at the skull base. Skull: Unremarkable. Sinuses: Minor mucosal thickening. Orbits: Unremarkable. CTA HEAD Anterior circulation: Significantly diminished flow within the visualized  right internal carotid artery with no discernible enhancement at and beyond the cavernous segment. Right middle and anterior cerebral arteries are patent. Mildly decreased caliber of the right M1 MCA. Diminished distal right MCA branch filling. Left intracranial internal carotid is patent with  calcified plaque causing mild stenosis. Left anterior and middle cerebral arteries are patent. There is narrowing of the left A1 ACA with severe stenosis near the origin. Posterior circulation: Intracranial vertebral arteries are patent. Posterior inferior cerebellar arteries are patent basilar artery is patent with diffuse irregularity of the distal portion, which is present on the prior study. However, there is now superimposed severe stenosis. Superior cerebellar arteries are patent with probable stenosis. Posterior cerebral arteries are patent. There is fetal origin of the left posterior cerebral artery. Venous sinuses: Not well evaluated IMPRESSION: Overall decreased volume of subarachnoid hemorrhage since 01/15/2020. New left subdural hygroma and subcentimeter rightward midline shift. Resolution of hydrocephalus. Further significantly diminished flow within the right ICA with no enhancement visualized at and beyond the cavernous segment. Mild increased right M1 MCA narrowing and diminished distal right MCA branch filling. New narrowing of the left A1 ACA with severe stenosis near the origin. Persistent irregularity of the distal basilar artery with new superimposed severe stenosis. Electronically Signed   By: Macy Mis M.D.   On: 01/18/2020 11:44   CT Angio Head W or Wo Contrast  Result Date: 01/14/2020 CLINICAL DATA:  Acute headache.  Normal neuro exam. EXAM: CT ANGIOGRAPHY HEAD AND NECK TECHNIQUE: Multidetector CT imaging of the head and neck was performed using the standard protocol during bolus administration of intravenous contrast. Multiplanar CT image reconstructions and MIPs were obtained to evaluate the vascular anatomy. Carotid stenosis measurements (when applicable) are obtained utilizing NASCET criteria, using the distal internal carotid diameter as the denominator. CONTRAST:  18m OMNIPAQUE IOHEXOL 350 MG/ML SOLN COMPARISON:  None. FINDINGS: CT HEAD FINDINGS Brain: Large volume acute  subarachnoid hemorrhage. Large amount of subarachnoid blood is seen in the basilar cisterns and extending into the sylvian fissure bilaterally right greater than left. Interhemispheric subarachnoid hemorrhage is present. Hemorrhage along the right tentorium medially may represent subdural or subarachnoid hemorrhage. Generalized atrophy without hydrocephalus. No acute infarct or mass. Vascular: Limited vascular evaluation due to subarachnoid hemorrhage. Skull: Negative Sinuses: Negative Orbits: Negative Review of the MIP images confirms the above findings CTA NECK FINDINGS Aortic arch: Standard branching. Imaged portion shows no evidence of aneurysm or dissection. No significant stenosis of the major arch vessel origins. Mild atherosclerotic calcification aortic arch. Right carotid system: Atherosclerotic calcification right carotid bifurcation. Approximately 25% diameter stenosis right internal carotid artery. Left carotid system: Atherosclerotic calcification left carotid bifurcation and carotid bulb. 50% diameter stenosis proximal left internal carotid artery. Vertebral arteries: Both vertebral arteries widely patent to the basilar without significant stenosis. Skeleton: Mild degenerative changes cervical spine without acute skeletal abnormality. Other neck: Negative for mass or adenopathy in the neck. Upper chest: Negative Review of the MIP images confirms the above findings CTA HEAD FINDINGS Anterior circulation: Atherosclerotic calcification in the cavernous carotid bilaterally with mild to moderate stenosis bilaterally. No aneurysm. Anterior and middle cerebral arteries patent bilaterally without large vessel occlusion. No aneurysm. Posterior circulation: Both vertebral arteries patent to the basilar. Basilar is small in caliber but patent. Superior cerebellar arteries patent bilaterally. Posterior cerebral arteries patent bilaterally. Fetal origin left posterior cerebral artery. No aneurysm in the posterior  circulation. Venous sinuses: Normal venous enhancement. Anatomic variants: None Review of the MIP images confirms the above  findings IMPRESSION: 1. Large volume subarachnoid hemorrhage, right greater than left. Possible right tentorial subdural hemorrhage versus subarachnoid hemorrhage. 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 Recommend catheter angiogram for further evaluation. 2. Negative for hydrocephalus.  No acute infarct. 3. Small caliber basilar artery could represent basal spasm. 4. 25% diameter stenosis proximal right internal carotid artery. 50% diameter stenosis proximal left internal carotid artery. Mild to moderate stenosis in the cavernous carotid bilaterally due to atherosclerotic calcification 5. These results were called by telephone at the time of interpretation on 01/14/2020 at 11:47 am to provider ABIGAIL HARRIS , who verbally acknowledged these results. Electronically Signed   By: Franchot Gallo M.D.   On: 01/14/2020 11:49   CT Angio Neck W and/or Wo Contrast  Result Date: 01/14/2020 CLINICAL DATA:  Acute headache.  Normal neuro exam. EXAM: CT ANGIOGRAPHY HEAD AND NECK TECHNIQUE: Multidetector CT imaging of the head and neck was performed using the standard protocol during bolus administration of intravenous contrast. Multiplanar CT image reconstructions and MIPs were obtained to evaluate the vascular anatomy. Carotid stenosis measurements (when applicable) are obtained utilizing NASCET criteria, using the distal internal carotid diameter as the denominator. CONTRAST:  160m OMNIPAQUE IOHEXOL 350 MG/ML SOLN COMPARISON:  None. FINDINGS: CT HEAD FINDINGS Brain: Large volume acute subarachnoid hemorrhage. Large amount of subarachnoid blood is seen in the basilar cisterns and extending into the sylvian fissure bilaterally right greater than left. Interhemispheric subarachnoid hemorrhage is present. Hemorrhage along the right tentorium  medially may represent subdural or subarachnoid hemorrhage. Generalized atrophy without hydrocephalus. No acute infarct or mass. Vascular: Limited vascular evaluation due to subarachnoid hemorrhage. Skull: Negative Sinuses: Negative Orbits: Negative Review of the MIP images confirms the above findings CTA NECK FINDINGS Aortic arch: Standard branching. Imaged portion shows no evidence of aneurysm or dissection. No significant stenosis of the major arch vessel origins. Mild atherosclerotic calcification aortic arch. Right carotid system: Atherosclerotic calcification right carotid bifurcation. Approximately 25% diameter stenosis right internal carotid artery. Left carotid system: Atherosclerotic calcification left carotid bifurcation and carotid bulb. 50% diameter stenosis proximal left internal carotid artery. Vertebral arteries: Both vertebral arteries widely patent to the basilar without significant stenosis. Skeleton: Mild degenerative changes cervical spine without acute skeletal abnormality. Other neck: Negative for mass or adenopathy in the neck. Upper chest: Negative Review of the MIP images confirms the above findings CTA HEAD FINDINGS Anterior circulation: Atherosclerotic calcification in the cavernous carotid bilaterally with mild to moderate stenosis bilaterally. No aneurysm. Anterior and middle cerebral arteries patent bilaterally without large vessel occlusion. No aneurysm. Posterior circulation: Both vertebral arteries patent to the basilar. Basilar is small in caliber but patent. Superior cerebellar arteries patent bilaterally. Posterior cerebral arteries patent bilaterally. Fetal origin left posterior cerebral artery. No aneurysm in the posterior circulation. Venous sinuses: Normal venous enhancement. Anatomic variants: None Review of the MIP images confirms the above findings IMPRESSION: 1. Large volume subarachnoid hemorrhage, right greater than left. Possible right tentorial subdural hemorrhage  versus subarachnoid hemorrhage. 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 Recommend catheter angiogram for further evaluation. 2. Negative for hydrocephalus.  No acute infarct. 3. Small caliber basilar artery could represent basal spasm. 4. 25% diameter stenosis proximal right internal carotid artery. 50% diameter stenosis proximal left internal carotid artery. Mild to moderate stenosis in the cavernous carotid bilaterally due to atherosclerotic calcification 5. These results were called by telephone at the time of interpretation on  01/14/2020 at 11:47 am to provider ABIGAIL HARRIS , who verbally acknowledged these results. Electronically Signed   By: Franchot Gallo M.D.   On: 01/14/2020 11:49   MR ANGIO HEAD WO CONTRAST  Result Date: 01/22/2020 CLINICAL DATA:  69 year old male who presented with acute headache on 01/14/2020 found to have subarachnoid hemorrhage and severe intracranial Vasospasm which was treated with balloon angioplasty on 01/18/2020. No intracranial aneurysm has been identified. 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 Dense left hemiplegia now. EXAM: MRI HEAD WITHOUT CONTRAST MRA HEAD WITHOUT CONTRAST TECHNIQUE: Multiplanar, multiecho pulse sequences of the brain and surrounding structures were obtained without intravenous contrast. Angiographic images of the head were obtained using MRA technique without contrast. COMPARISON:  Brain MRI and intracranial MRA 01/19/2020 and earlier. FINDINGS: MRI HEAD FINDINGS Brain: Left side subdural hematoma measures up to 9 mm thickness now on coronal images, previously 6-7 mm). Unchanged signal within the left subdural blood products, mildly heterogeneous FLAIR signal within the left subdural hematoma. Trace right subdural blood mostly along the parietal and occipital lobes appears decreased. Rightward midline shift has increased to 7 mm  (previously 6 mm). No ventriculomegaly. And no significant intraventricular hemorrhage despite continued basilar cistern predominant subarachnoid blood which is best demonstrated on FLAIR and DWI. Scattered small foci of restricted diffusion are redemonstrated in the right hemisphere were new and/or increased cortical restricted diffusion in the posterior right frontal and right parietal lobes near the perirolandic cortex on series 3, image 45 today. Compare to series 5, image 97 previously. Conspicuous restricted diffusion along the ventral right thalamus and right cauda thalamic groove has not significantly changed. No convincing abnormal diffusion in the left hemisphere or posterior fossa, and no other areas of significantly progressed diffusion abnormality. Associated gyriform cytotoxic edema in the right perirolandic cortex. No malignant hemorrhagic transformation identified. Negative cervicomedullary junction and pituitary. Vascular: Major intracranial vascular flow voids appears stable. Skull and upper cervical spine: Negative visible cervical spine, bone marrow signal. Sinuses/Orbits: Negative orbits. Stable mild paranasal sinus mucosal thickening. Other: Stable trace mastoid fluid. MRA HEAD FINDINGS Intracranial MRA today is more motion degraded in addition to some vessel obscuration due to the T1 intrinsic subarachnoid blood. Antegrade flow appears stable in the distal vertebral arteries and basilar. No convincing vertebrobasilar stenosis. As before fetal type PCA origins are suspected, more so the left. Largely obscured PCA branch detail today. Antegrade flow in both ICA siphons appears stable. Both carotid termini appear patent. The right ACA A1 appears to be dominant as before. Both MCA M1 segments are patent. But bilateral MCA and ACA branch detail is largely obscured today. IMPRESSION: 1. Mildly increased Left Subdural Hematoma since 01/19/2020, now up to 9 mm in thickness. Smaller right side subdural  hematoma has regressed, now trace. 2. Associated mildly increased rightward midline shift, now 7 mm. 3. Stable moderate volume basilar cistern predominant Subarachnoid Hemorrhage with no significant intraventricular hemorrhage and no ventriculomegaly. 4. Increased right hemisphere superior peri-Rolandic infarcts since 01/19/2020. Cytotoxic edema with no associated parenchymal hemorrhage or mass effect. 5. Otherwise stable multifocal right hemisphere ischemia, including at the right caudothalamic groove. 6. Intracranial MRA is more motion degraded today, but flow within the large vessels appear stable since 01/19/2020. Largely obscured second order and distal branches today. Electronically Signed   By: Genevie Ann M.D.   On: 01/22/2020 15:22   MR ANGIO HEAD WO CONTRAST  Result Date: 01/19/2020 CLINICAL DATA:  Subarachnoid hemorrhage with vasospasm, follow-up  EXAM: MRI HEAD WITHOUT CONTRAST MRA HEAD WITHOUT CONTRAST TECHNIQUE: Multiplanar, multiecho pulse sequences of the brain and surrounding structures were obtained without intravenous contrast. Angiographic images of the head were obtained using MRA technique without contrast. COMPARISON:  None. FINDINGS: MRI HEAD Brain: There are scattered small foci of restricted the right cerebral hemisphere including the medial thalamus. Thin primarily CSF intensity subdural collections again identified along the left cerebral convexity. There is some T2 FLAIR hyperintensity dependently likely reflecting blood products. Additional very thin T2 for FLAIR hyperintense subdural collection is present along the posterior right cerebral convexity. There is stable rightward midline shift measuring 6 mm. Basal and sulcal subarachnoid hemorrhage is present with similar distribution to prior CTA. Vascular: Major vessel flow voids at the skull base are preserved. Decreased caliber of the distal basilar artery flow void. Skull and upper cervical spine: Normal marrow signal is preserved.  Sinuses/Orbits: Mild paranasal sinus mucosal thickening. Orbits are unremarkable. Other: Sella is unremarkable.  Mastoid air cells are clear. MRA HEAD Suboptimal visualization of proximal intracranial vessels due to intrinsic T1 hyperintensity of hemorrhage. There is preserved flow related enhancement of the intracranial right internal carotid artery to the level of the clinoid. There is severe stenosis of the supraclinoid portion. This reflects improvement from the 01/18/2020 CTA with appearance now similar to the 01/14/2020 CTA. Preserved flow related enhancement of the intracranial left vertebral artery with moderate stenosis of the supraclinoid portion. Right M1 MCA is difficult to evaluate but there is likely some stenosis. Left middle cerebral artery is patent. The anterior cerebral arteries are patent. There is likely persistent narrowing of the left A1 ACA with severe stenosis near the origin. Intracranial vertebral arteries are patent. The distal basilar is not well evaluated but persistent stenosis is suspected. Right posterior cerebral artery is patent, noting that the P1 segment is not well evaluated. There is fetal origin of the left posterior cerebral artery. IMPRESSION: Multiple small acute infarcts the right cerebral hemisphere primarily involving the anterior greater than posterior circulations. Thin left larger than right subdural collections with stable mild rightward midline shift. Residual subarachnoid hemorrhage likely similar to prior CT. Suboptimal vascular evaluation due to intrinsic T1 hyperintensity of hemorrhage. Improved flow within the intracranial right ICA compared to 01/18/2020 CTA. Appearance is now similar to 01/14/2020 CTA with persistent severe stenosis of the supraclinoid portion. Likely persistent left A1 ACA stenosis and distal basilar stenosis. Electronically Signed   By: Macy Mis M.D.   On: 01/19/2020 13:39   MR BRAIN WO CONTRAST  Result Date: 01/22/2020 CLINICAL  DATA:  69 year old male who presented with acute headache on 01/14/2020 found to have subarachnoid hemorrhage and severe intracranial Vasospasm which was treated with balloon angioplasty on 01/18/2020. No intracranial aneurysm has been identified. 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 Dense left hemiplegia now. EXAM: MRI HEAD WITHOUT CONTRAST MRA HEAD WITHOUT CONTRAST TECHNIQUE: Multiplanar, multiecho pulse sequences of the brain and surrounding structures were obtained without intravenous contrast. Angiographic images of the head were obtained using MRA technique without contrast. COMPARISON:  Brain MRI and intracranial MRA 01/19/2020 and earlier. FINDINGS: MRI HEAD FINDINGS Brain: Left side subdural hematoma measures up to 9 mm thickness now on coronal images, previously 6-7 mm). Unchanged signal within the left subdural blood products, mildly heterogeneous FLAIR signal within the left subdural hematoma. Trace right subdural blood mostly along the parietal and occipital lobes appears decreased. Rightward midline shift has increased to 7 mm (previously 6  mm). No ventriculomegaly. And no significant intraventricular hemorrhage despite continued basilar cistern predominant subarachnoid blood which is best demonstrated on FLAIR and DWI. Scattered small foci of restricted diffusion are redemonstrated in the right hemisphere were new and/or increased cortical restricted diffusion in the posterior right frontal and right parietal lobes near the perirolandic cortex on series 3, image 45 today. Compare to series 5, image 97 previously. Conspicuous restricted diffusion along the ventral right thalamus and right cauda thalamic groove has not significantly changed. No convincing abnormal diffusion in the left hemisphere or posterior fossa, and no other areas of significantly progressed diffusion abnormality. Associated gyriform cytotoxic edema in the right  perirolandic cortex. No malignant hemorrhagic transformation identified. Negative cervicomedullary junction and pituitary. Vascular: Major intracranial vascular flow voids appears stable. Skull and upper cervical spine: Negative visible cervical spine, bone marrow signal. Sinuses/Orbits: Negative orbits. Stable mild paranasal sinus mucosal thickening. Other: Stable trace mastoid fluid. MRA HEAD FINDINGS Intracranial MRA today is more motion degraded in addition to some vessel obscuration due to the T1 intrinsic subarachnoid blood. Antegrade flow appears stable in the distal vertebral arteries and basilar. No convincing vertebrobasilar stenosis. As before fetal type PCA origins are suspected, more so the left. Largely obscured PCA branch detail today. Antegrade flow in both ICA siphons appears stable. Both carotid termini appear patent. The right ACA A1 appears to be dominant as before. Both MCA M1 segments are patent. But bilateral MCA and ACA branch detail is largely obscured today. IMPRESSION: 1. Mildly increased Left Subdural Hematoma since 01/19/2020, now up to 9 mm in thickness. Smaller right side subdural hematoma has regressed, now trace. 2. Associated mildly increased rightward midline shift, now 7 mm. 3. Stable moderate volume basilar cistern predominant Subarachnoid Hemorrhage with no significant intraventricular hemorrhage and no ventriculomegaly. 4. Increased right hemisphere superior peri-Rolandic infarcts since 01/19/2020. Cytotoxic edema with no associated parenchymal hemorrhage or mass effect. 5. Otherwise stable multifocal right hemisphere ischemia, including at the right caudothalamic groove. 6. Intracranial MRA is more motion degraded today, but flow within the large vessels appear stable since 01/19/2020. Largely obscured second order and distal branches today. Electronically Signed   By: Genevie Ann M.D.   On: 01/22/2020 15:22   MR BRAIN WO CONTRAST  Result Date: 01/19/2020 CLINICAL DATA:   Subarachnoid hemorrhage with vasospasm, follow-up EXAM: MRI HEAD WITHOUT CONTRAST MRA HEAD WITHOUT CONTRAST TECHNIQUE: Multiplanar, multiecho pulse sequences of the brain and surrounding structures were obtained without intravenous contrast. Angiographic images of the head were obtained using MRA technique without contrast. COMPARISON:  None. FINDINGS: MRI HEAD Brain: There are scattered small foci of restricted the right cerebral hemisphere including the medial thalamus. Thin primarily CSF intensity subdural collections again identified along the left cerebral convexity. There is some T2 FLAIR hyperintensity dependently likely reflecting blood products. Additional very thin T2 for FLAIR hyperintense subdural collection is present along the posterior right cerebral convexity. There is stable rightward midline shift measuring 6 mm. Basal and sulcal subarachnoid hemorrhage is present with similar distribution to prior CTA. Vascular: Major vessel flow voids at the skull base are preserved. Decreased caliber of the distal basilar artery flow void. Skull and upper cervical spine: Normal marrow signal is preserved. Sinuses/Orbits: Mild paranasal sinus mucosal thickening. Orbits are unremarkable. Other: Sella is unremarkable.  Mastoid air cells are clear. MRA HEAD Suboptimal visualization of proximal intracranial vessels due to intrinsic T1 hyperintensity of hemorrhage. There is preserved flow related enhancement of the intracranial right internal carotid artery to the level  of the clinoid. There is severe stenosis of the supraclinoid portion. This reflects improvement from the 01/18/2020 CTA with appearance now similar to the 01/14/2020 CTA. Preserved flow related enhancement of the intracranial left vertebral artery with moderate stenosis of the supraclinoid portion. Right M1 MCA is difficult to evaluate but there is likely some stenosis. Left middle cerebral artery is patent. The anterior cerebral arteries are patent.  There is likely persistent narrowing of the left A1 ACA with severe stenosis near the origin. Intracranial vertebral arteries are patent. The distal basilar is not well evaluated but persistent stenosis is suspected. Right posterior cerebral artery is patent, noting that the P1 segment is not well evaluated. There is fetal origin of the left posterior cerebral artery. IMPRESSION: Multiple small acute infarcts the right cerebral hemisphere primarily involving the anterior greater than posterior circulations. Thin left larger than right subdural collections with stable mild rightward midline shift. Residual subarachnoid hemorrhage likely similar to prior CT. Suboptimal vascular evaluation due to intrinsic T1 hyperintensity of hemorrhage. Improved flow within the intracranial right ICA compared to 01/18/2020 CTA. Appearance is now similar to 01/14/2020 CTA with persistent severe stenosis of the supraclinoid portion. Likely persistent left A1 ACA stenosis and distal basilar stenosis. Electronically Signed   By: Macy Mis M.D.   On: 01/19/2020 13:39   IR PTA Vasospasm Initial  Result Date: 01/18/2020 PROCEDURE: DIAGNOSTIC CEREBRAL ANGIOGRAM BALLOON ANGIOPLASTY OF RIGHT INTERNAL CAROTID ARTERY BALLOON ANGIOPLASTY OF RIGHT MIDDLE CEREBRAL ARTERY BALLOON ANGIOPLASTY OF BASILAR ARTERY CHEMICAL ANGIOPLASTY OF RIGHT INTERNAL CAROTID ARTERY CHEMICAL ANGIOPLASTY OF LEFT INTERNAL CAROTID ARTERY HISTORY: The patient is a 69 year old man on his fourth hospital day admitted for subarachnoid hemorrhage. He initially underwent angiogram a few days ago which was negative for intracranial aneurysm however at that time did demonstrate significant primarily right-sided supraclinoid internal carotid artery vasospasm. The patient has been monitored in the intensive care unit with stable neurologic exam until this morning when he was noted to have progressively worsening left hemiplegia, hemi neglect, and gaze preference to the  right. 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 He therefore presents emergently for diagnostic cerebral angiogram and likely angioplasty. ACCESS: The technical aspects of the procedure as well as its potential risks and benefits were reviewed with the patient's wife. These risks included but were not limited to stroke, intracranial hemorrhage, bleeding, infection, allergic reaction, damage to organs or vital structures, stroke, non-diagnostic procedure, and the catastrophic outcomes of heart attack, coma, and death. With an understanding of these risks, informed consent was obtained and witnessed. The patient was placed in the supine position on the angiography table and the skin of right groin prepped in the usual sterile fashion. The procedure was performed under general anesthesia. A 5-French sheath was introduced in the right common femoral artery using Seldinger technique. MEDICATIONS: HEPARIN: 3000 Units total. VERAPAMIL: 26m Intra-arterial NITROGLYCERIN: Approximately 10068m intra-arterial CONTRAST:  8034mMNIPAQUE IOHEXOL 300 MG/ML SOLN, 60m21mNIPAQUE IOHEXOL 300 MG/ML SOLNcc, Omnipaque 300 FLUOROSCOPY TIME:  FLUOROSCOPY TIME: See IR records TECHNIQUE: CATHETERS AND WIRES 5-French envoy MPD guide catheter Transform [place] 4mm 11mmm b9moon catheter Transform C 3mm x 19mm ba22mn catheter VESSELS CATHETERIZED Right internal carotid Left internal carotid Left vertebral Basilar artery Right common femoral VESSELS STUDIED Right internal carotid, head Left internal carotid, head Left vertebral Right common femoral PROCEDURAL NARRATIVE A 5-Fr MPD guide catheter was introduced over the Glidewire and advanced over the aortic arch. The right common  carotid artery was catheterized, and angiogram was taken revealing severe right-sided supraclinoid internal carotid artery spasm. Initially, 10 mg of verapamil was injected intra-arterially into the right  internal carotid artery. Angiogram did not reveal significant improvement in the spasm. I therefore introduced 1 mg of nitroglycerin into the 1 m heparinized flush back connected to the guide catheter. The 4 mm x 7 mm supra compliant balloon catheter was prepared on the back table. The balloon catheter was then introduced over the microwire and advanced into the supraclinoid internal carotid artery. Multiple inflations of the balloon catheter cause migration of the catheter due to the significant stenosis about the supraclinoid segment. I therefore removed this balloon catheter and the above 3 mm x 15 mm compliant balloon was prepared in a similar fashion and advanced over the microwire. The balloon was then placed across the supraclinoid segment of the internal carotid artery, and successive inflations of the balloon were performed over the supraclinoid segment into the M1 segment on the right. After angioplasty, angiogram was taken which demonstrated significant improvement in the caliber of the supraclinoid internal carotid artery with restoration of flow through the right middle cerebral artery territory. At this point the balloon catheter was removed, and the guide catheter was withdrawn down into the aortic arch and the left internal carotid artery was catheterized. Angiogram did reveal spasms slightly worsened in comparison to the prior angiogram primarily involving the supraclinoid internal carotid artery. The spasm does also involve the left A1 segment which is relatively small. At this point I elected to proceed with chemical angioplasty as I think it would be extremely difficult to advance a balloon catheter into the left A1 given the angle of origin and the very small size of the vessel. 5 mg of verapamil was therefore introduced intra-arterially. At this point the guide catheter was used to select the left vertebral artery. Angiogram revealed severe spasm of the basilar artery. The glidewire was  introduced and the guide catheter was tracked over the Glidewire into the distal V2 segment. Under roadmap guidance, the above 3 mm x 15 mm compliant balloon was introduced over the microwire and advanced into the basilar artery. Again with multiple successive inflations of the balloon, the basilar artery was angioplasty. Final angiogram was taken revealing improvement in the caliber of the basilar artery and perfusion of the posterior circulation. Balloon catheter was then removed and final angiogram was taken. The guide catheter was then removed without incident. FINDINGS: Right internal carotid, head: Injection reveals significantly decreased flow through the right internal carotid artery, although the carotid is patent to the supraclinoid segment. There is significant flow limitation and contrast stasis within this vessel. There is minimal change in amount of flow or vessel caliber after administration of 10 mg of verapamil. Angiograms taken after balloon angioplasty of the supraclinoid internal carotid artery and the proximal M1 reveal improved caliber of the supraclinoid internal carotid artery. No filling defects or dissection flaps are identified. There is the restoration of flow through the right middle cerebral artery territory post angioplasty. Left internal carotid, head: Injection reveals the presence of a patent internal carotid artery, with moderate vasospasm involving the supraclinoid segment. In comparison to the prior angiogram, flow to the contralateral ACA and MCA territory is no longer visualized. Angiogram taken after administration of 5 mg of verapamil shows minimal change in vessel caliber. There is flash filling of the contralateral ACA territory however. Left vertebral: Injection reveals the presence of a patent left vertebral artery, with  severe vasospasm involving the length of the basilar artery. Post angioplasty angiogram reveals significant improvement in the caliber of the basilar  artery, with improved flow through the posterior circulation. No filling defects or dissection flaps are identified. Right femoral: Normal vessel. No significant atherosclerotic disease. Arterial sheath in adequate position. DISPOSITION: Upon completion of the study, the femoral sheath was removed and hemostasis obtained using a 5-Fr ExoSeal closure device. Good proximal and distal lower extremity pulses were documented upon achievement of hemostasis. The procedure was well tolerated and no early complications were observed. The patient was transferred to the postanesthesia care unit in stable hemodynamic condition. IMPRESSION: 1. 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 There is significant improvement in vessel caliber and distal territory perfusion after balloon angioplasty of the right internal carotid artery, right middle cerebral artery, and basilar artery. The preliminary results of this procedure were shared with the patient's family. Electronically Signed   By: Consuella Lose   On: 01/18/2020 14:28   IR PTA Vasospasm Add Diff  Result Date: 01/18/2020 PROCEDURE: DIAGNOSTIC CEREBRAL ANGIOGRAM BALLOON ANGIOPLASTY OF RIGHT INTERNAL CAROTID ARTERY BALLOON ANGIOPLASTY OF RIGHT MIDDLE CEREBRAL ARTERY BALLOON ANGIOPLASTY OF BASILAR ARTERY CHEMICAL ANGIOPLASTY OF RIGHT INTERNAL CAROTID ARTERY CHEMICAL ANGIOPLASTY OF LEFT INTERNAL CAROTID ARTERY HISTORY: The patient is a 70 year old man on his fourth hospital day admitted for subarachnoid hemorrhage. He initially underwent angiogram a few days ago which was negative for intracranial aneurysm however at that time did demonstrate significant primarily right-sided supraclinoid internal carotid artery vasospasm. The patient has been monitored in the intensive care unit with stable neurologic exam until this morning when he was noted to have progressively worsening left  hemiplegia, hemi neglect, and gaze preference to the right. 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 He therefore presents emergently for diagnostic cerebral angiogram and likely angioplasty. ACCESS: The technical aspects of the procedure as well as its potential risks and benefits were reviewed with the patient's wife. These risks included but were not limited to stroke, intracranial hemorrhage, bleeding, infection, allergic reaction, damage to organs or vital structures, stroke, non-diagnostic procedure, and the catastrophic outcomes of heart attack, coma, and death. With an understanding of these risks, informed consent was obtained and witnessed. The patient was placed in the supine position on the angiography table and the skin of right groin prepped in the usual sterile fashion. The procedure was performed under general anesthesia. A 5-French sheath was introduced in the right common femoral artery using Seldinger technique. MEDICATIONS: HEPARIN: 3000 Units total. VERAPAMIL: 74m Intra-arterial NITROGLYCERIN: Approximately 10048m intra-arterial CONTRAST:  8035mMNIPAQUE IOHEXOL 300 MG/ML SOLN, 25m50mNIPAQUE IOHEXOL 300 MG/ML SOLNcc, Omnipaque 300 FLUOROSCOPY TIME:  FLUOROSCOPY TIME: See IR records TECHNIQUE: CATHETERS AND WIRES 5-French envoy MPD guide catheter Transform [place] 4mm 45mmm b7moon catheter Transform C 3mm x 44mm ba26mn catheter VESSELS CATHETERIZED Right internal carotid Left internal carotid Left vertebral Basilar artery Right common femoral VESSELS STUDIED Right internal carotid, head Left internal carotid, head Left vertebral Right common femoral PROCEDURAL NARRATIVE A 5-Fr MPD guide catheter was introduced over the Glidewire and advanced over the aortic arch. 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 Initially, 10 mg of  verapamil was injected intra-arterially into the right internal carotid artery. Angiogram did not reveal significant improvement in the spasm. I therefore introduced  1 mg of nitroglycerin into the 1 m heparinized flush back connected to the guide catheter. The 4 mm x 7 mm supra compliant balloon catheter was prepared on the back table. The balloon catheter was then introduced over the microwire and advanced into the supraclinoid internal carotid artery. Multiple inflations of the balloon catheter cause migration of the catheter due to the significant stenosis about the supraclinoid segment. I therefore removed this balloon catheter and the above 3 mm x 15 mm compliant balloon was prepared in a similar fashion and advanced over the microwire. The balloon was then placed across the supraclinoid segment of the internal carotid artery, and successive inflations of the balloon were performed over the supraclinoid segment into the M1 segment on the right. After angioplasty, angiogram was taken which demonstrated significant improvement in the caliber of the supraclinoid internal carotid artery with restoration of flow through the right middle cerebral artery territory. At this point the balloon catheter was removed, and the guide catheter was withdrawn down into the aortic arch and the left internal carotid artery was catheterized. Angiogram did reveal spasms slightly worsened in comparison to the prior angiogram primarily involving the supraclinoid internal carotid artery. The spasm does also involve the left A1 segment which is relatively small. At this point I elected to proceed with chemical angioplasty as I think it would be extremely difficult to advance a balloon catheter into the left A1 given the angle of origin and the very small size of the vessel. 5 mg of verapamil was therefore introduced intra-arterially. At this point the guide catheter was used to select the left vertebral artery. Angiogram revealed severe  spasm of the basilar artery. The glidewire was introduced and the guide catheter was tracked over the Glidewire into the distal V2 segment. Under roadmap guidance, the above 3 mm x 15 mm compliant balloon was introduced over the microwire and advanced into the basilar artery. Again with multiple successive inflations of the balloon, the basilar artery was angioplasty. Final angiogram was taken revealing improvement in the caliber of the basilar artery and perfusion of the posterior circulation. Balloon catheter was then removed and final angiogram was taken. The guide catheter was then removed without incident. FINDINGS: Right internal carotid, head: Injection reveals significantly decreased flow through the right internal carotid artery, although the carotid is patent to the supraclinoid segment. There is significant flow limitation and contrast stasis within this vessel. There is minimal change in amount of flow or vessel caliber after administration of 10 mg of verapamil. Angiograms taken after balloon angioplasty of the supraclinoid internal carotid artery and the proximal M1 reveal improved caliber of the supraclinoid internal carotid artery. No filling defects or dissection flaps are identified. There is the restoration of flow through the right middle cerebral artery territory post angioplasty. Left internal carotid, head: Injection reveals the presence of a patent internal carotid artery, with moderate vasospasm involving the supraclinoid segment. In comparison to the prior angiogram, flow to the contralateral ACA and MCA territory is no longer visualized. Angiogram taken after administration of 5 mg of verapamil shows minimal change in vessel caliber. There is flash filling of the contralateral ACA territory however. Left vertebral: Injection reveals the presence of a patent left vertebral artery, with severe vasospasm involving the length of the basilar artery. Post angioplasty angiogram reveals  significant improvement in the caliber of the basilar artery, with improved flow through the posterior circulation. No filling defects or dissection flaps are identified. Right femoral: Normal vessel. No  significant atherosclerotic disease. Arterial sheath in adequate position. DISPOSITION: Upon completion of the study, the femoral sheath was removed and hemostasis obtained using a 5-Fr ExoSeal closure device. Good proximal and distal lower extremity pulses were documented upon achievement of hemostasis. The procedure was well tolerated and no early complications were observed. The patient was transferred to the postanesthesia care unit in stable hemodynamic condition. IMPRESSION: 1. 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 There is significant improvement in vessel caliber and distal territory perfusion after balloon angioplasty of the right internal carotid artery, right middle cerebral artery, and basilar artery. The preliminary results of this procedure were shared with the patient's family. Electronically Signed   By: Consuella Lose   On: 01/18/2020 14:28   DG CHEST PORT 1 VIEW  Result Date: 01/30/2020 CLINICAL DATA:  Respiratory failure. EXAM: PORTABLE CHEST 1 VIEW COMPARISON:  Chest x-ray dated Jan 28, 2020. FINDINGS: Interval removal of the left subclavian central venous catheter. Stable cardiomediastinal silhouette. Unchanged small left pleural effusion with adjacent left lower lobe opacity. The right lung is clear. No pneumothorax. No acute osseous abnormality. IMPRESSION: 1. Unchanged small left pleural effusion with adjacent left lower lobe atelectasis versus infiltrate. Electronically Signed   By: Titus Dubin M.D.   On: 01/30/2020 07:54   DG CHEST PORT 1 VIEW  Result Date: 01/28/2020 CLINICAL DATA:  Left arm swelling and leaking central venous line EXAM: PORTABLE CHEST 1 VIEW COMPARISON:   01/22/2020 FINDINGS: Cardiac shadow is stable. Small left-sided pleural effusion is noted increased from the prior exam. No focal confluent infiltrate is seen. The previously seen left subclavian central line has withdrawn and now has a loop likely underneath the skin with the catheter tip in the central left subclavian vein near its junction with the internal jugular vein. No other focal abnormality is seen. IMPRESSION: New left-sided pleural effusion when compare with the prior exam. Left-sided central line has withdrawn significantly with looping likely underneath the skin. Correlate with the clinical exam. This may require removal and replacement at a separate site. Electronically Signed   By: Inez Catalina M.D.   On: 01/28/2020 16:37   DG CHEST PORT 1 VIEW  Result Date: 01/22/2020 CLINICAL DATA:  Hypoxia/respiratory failure EXAM: PORTABLE CHEST 1 VIEW COMPARISON:  Jan 20, 2020 FINDINGS: There is mild left base atelectasis. The lungs elsewhere are clear. There is cardiomegaly with pulmonary vascularity normal. No adenopathy. There is degenerative change in the thoracic spine. IMPRESSION: Slight left base atelectasis. Lungs elsewhere clear. Stable cardiac prominence. No adenopathy evident. Electronically Signed   By: Lowella Grip III M.D.   On: 01/22/2020 07:59   DG CHEST PORT 1 VIEW  Result Date: 01/20/2020 CLINICAL DATA:  Endotracheal tube placement. EXAM: PORTABLE CHEST 1 VIEW COMPARISON:  01/19/2020 FINDINGS: Left subclavian central venous catheter unchanged. No evidence of endotracheal tube. Lungs are somewhat hypoinflated with minimal left basilar opacification likely atelectasis. Mild stable cardiomegaly. Remainder the exam is unchanged. IMPRESSION: 1. Minimal left base opacification likely atelectasis. Mild stable cardiomegaly. 2. Left subclavian central venous catheter unchanged. No visualization of endotracheal tube. Electronically Signed   By: Marin Olp M.D.   On: 01/20/2020 10:45   DG  CHEST PORT 1 VIEW  Result Date: 01/19/2020 CLINICAL DATA:  Fever EXAM: PORTABLE CHEST 1 VIEW COMPARISON:  01/18/2020 FINDINGS: Cardiomegaly. Left chest vascular catheter remains in unchanged position, tip over the mid SVC. Both lungs are clear. The visualized  skeletal structures are unremarkable. IMPRESSION: Cardiomegaly without acute abnormality of the lungs in AP portable projection. Electronically Signed   By: Eddie Candle M.D.   On: 01/19/2020 17:36   DG CHEST PORT 1 VIEW  Result Date: 01/18/2020 CLINICAL DATA:  69 year old male central line placement. EXAM: PORTABLE CHEST 1 VIEW COMPARISON:  0927 hours earlier today. FINDINGS: Portable AP semi upright view at 1644 hours. Left subclavian approach central line now in place, tip at the lower SVC level. Stable cardiac size and mediastinal contours. No pneumothorax. Allowing for portable technique the lungs appear clear. Visualized tracheal air column is within normal limits. No acute osseous abnormality identified. IMPRESSION: 1. Left subclavian approach central line placed, tip at the lower SVC level. 2. No pneumothorax or acute cardiopulmonary abnormality. Electronically Signed   By: Genevie Ann M.D.   On: 01/18/2020 16:54   DG CHEST PORT 1 VIEW  Result Date: 01/18/2020 CLINICAL DATA:  Hypoxia. EXAM: PORTABLE CHEST 1 VIEW COMPARISON:  No prior. FINDINGS: Mediastinum and hilar structures normal. Cardiomegaly. No pulmonary venous congestion. Low lung volumes. Mild bibasilar infiltrates. No pleural effusion or pneumothorax. IMPRESSION: 1.  Cardiomegaly.  No pulmonary venous congestion. 2.  Low lung volumes.  Mild bibasilar infiltrates. Electronically Signed   By: Marcello Moores  Register   On: 01/18/2020 09:30   DG Chest Port 1 View  Result Date: 01/14/2020 CLINICAL DATA:  Altered mental status.  Headache. EXAM: PORTABLE CHEST 1 VIEW COMPARISON:  None. FINDINGS: There is slight left base atelectasis. Lungs elsewhere are clear. Heart is upper normal in size with  pulmonary vascularity normal. No adenopathy. No bone lesions. IMPRESSION: Slight left base atelectasis. Lungs otherwise clear. Heart upper normal in size. Electronically Signed   By: Lowella Grip III M.D.   On: 01/14/2020 10:04   ECHOCARDIOGRAM COMPLETE  Result Date: 01/15/2020    ECHOCARDIOGRAM REPORT   Patient Name:   TOPHER BUENAVENTURA Date of Exam: 01/15/2020 Medical Rec #:  [id number]   Height:       72.0 in Accession #:    [id number]  Weight:       200.0 lb Date of Birth:  1951-01-08   BSA:          2.131 m Patient Age:    45 years    BP:           146/72 mmHg Patient Gender: M           HR:           99 bpm. Exam Location:  Inpatient Procedure: 2D Echo Indications:    Murmur 785.2 / R01.1  History:        Patient has no prior history of Echocardiogram examinations.                 Risk Factors:Hypertension, Diabetes and Dyslipidemia. Hepatitis                 A subarachnoid hemorrhage.  Sonographer:    Darlina Sicilian RDCS Referring Phys: [place]  1. Normal LV systolic function; proximal septal thickening; grade 1 diastolic dysfunction; mildly dilated aortic root; scerotic aortic valve.  2. Left ventricular ejection fraction, by estimation, is 70 to 75%. The left ventricle has hyperdynamic function. The left ventricle has no regional wall motion abnormalities. There is mild left ventricular hypertrophy of the basal segment. Left ventricular diastolic parameters are consistent with Grade I diastolic dysfunction (impaired relaxation).  3. Right ventricular systolic function is normal. The right ventricular  size is normal.  4. The mitral valve is normal in structure. No evidence of mitral valve regurgitation. No evidence of mitral stenosis.  5. The aortic valve is tricuspid. Aortic valve regurgitation is not visualized. Mild to moderate aortic valve sclerosis/calcification is present, without any evidence of aortic stenosis.  6. Aortic dilatation noted. There is mild dilatation of the aortic  root measuring 38 mm.  7. The inferior vena cava is normal in size with greater than 50% respiratory variability, suggesting right atrial pressure of 3 mmHg. FINDINGS  Left Ventricle: Left ventricular ejection fraction, by estimation, is 70 to 75%. The left ventricle has hyperdynamic function. The left ventricle has no regional wall motion abnormalities. The left ventricular internal cavity size was normal in size. There is mild left ventricular hypertrophy of the basal segment. Left ventricular diastolic parameters are consistent with Grade I diastolic dysfunction (impaired relaxation). Right Ventricle: The right ventricular size is normal.Right ventricular systolic function is normal. Left Atrium: Left atrial size was normal in size. Right Atrium: Right atrial size was normal in size. Pericardium: There is no evidence of pericardial effusion. Mitral Valve: The mitral valve is normal in structure. There is mild calcification of the mitral valve leaflet(s). Normal mobility of the mitral valve leaflets. No evidence of mitral valve regurgitation. No evidence of mitral valve stenosis. Tricuspid Valve: The tricuspid valve is normal in structure. Tricuspid valve regurgitation is trivial. No evidence of tricuspid stenosis. Aortic Valve: The aortic valve is tricuspid. Aortic valve regurgitation is not visualized. Mild to moderate aortic valve sclerosis/calcification is present, without any evidence of aortic stenosis. Pulmonic Valve: The pulmonic valve was normal in structure. Pulmonic valve regurgitation is trivial. No evidence of pulmonic stenosis. Aorta: Aortic dilatation noted. There is mild dilatation of the aortic root measuring 38 mm. Venous: The inferior vena cava is normal in size with greater than 50% respiratory variability, suggesting right atrial pressure of 3 mmHg. IAS/Shunts: No atrial level shunt detected by color flow Doppler. Additional Comments: Normal LV systolic function; proximal septal thickening;  grade 1 diastolic dysfunction; mildly dilated aortic root; scerotic aortic valve.  LEFT VENTRICLE PLAX 2D LVIDd:         4.50 cm  Diastology LVIDs:         2.60 cm  LV e' lateral:   6.48 cm/s LV PW:         0.90 cm  LV E/e' lateral: 15.7 LV IVS:        1.70 cm  LV e' medial:    7.18 cm/s LVOT diam:     2.20 cm  LV E/e' medial:  14.2 LV SV:         63 LV SV Index:   30 LVOT Area:     3.80 cm  RIGHT VENTRICLE RV S prime:     15.90 cm/s TAPSE (M-mode): 2.3 cm LEFT ATRIUM           Index LA diam:      4.10 cm 1.92 cm/m LA Vol (A2C): 36.6 ml 17.18 ml/m LA Vol (A4C): 47.0 ml 22.06 ml/m  AORTIC VALVE LVOT Vmax:   99.00 cm/s LVOT Vmean:  71.100 cm/s LVOT VTI:    0.167 m  AORTA Ao Asc diam: 3.80 cm MITRAL VALVE MV Area (PHT): 4.31 cm     SHUNTS MV Decel Time: 176 msec     Systemic VTI:  0.17 m MV E velocity: 102.00 cm/s  Systemic Diam: 2.20 cm MV A velocity: 104.00 cm/s MV E/A ratio:  0.98 Kirk Ruths MD Electronically signed by Kirk Ruths MD Signature Date/Time: 01/15/2020/2:22:50 PM    Final    IR ENDOVASC INTRACRANIAL INF OTHER THAN THROMBO ART INC DIAG ANGIO  Result Date: 01/18/2020 PROCEDURE: DIAGNOSTIC CEREBRAL ANGIOGRAM BALLOON ANGIOPLASTY OF RIGHT INTERNAL CAROTID ARTERY BALLOON ANGIOPLASTY OF RIGHT MIDDLE CEREBRAL ARTERY BALLOON ANGIOPLASTY OF BASILAR ARTERY CHEMICAL ANGIOPLASTY OF RIGHT INTERNAL CAROTID ARTERY CHEMICAL ANGIOPLASTY OF LEFT INTERNAL CAROTID ARTERY HISTORY: The patient is a 69 year old man on his fourth hospital day admitted for subarachnoid hemorrhage. He initially underwent angiogram a few days ago which was negative for intracranial aneurysm however at that time did demonstrate significant primarily right-sided supraclinoid internal carotid artery vasospasm. The patient has been monitored in the intensive care unit with stable neurologic exam until this morning when he was noted to have progressively worsening left hemiplegia, hemi neglect, and gaze preference to the right. CT  angiogram revealed near occlusion of the right internal carotid artery and significant basilar spasm. He therefore presents emergently for diagnostic cerebral angiogram and likely angioplasty. ACCESS: The technical aspects of the procedure as well as its potential risks and benefits were reviewed with the patient's wife. These risks included but were not limited to stroke, intracranial hemorrhage, bleeding, infection, allergic reaction, damage to organs or vital structures, stroke, non-diagnostic procedure, and the catastrophic outcomes of heart attack, coma, and death. With an understanding of these risks, informed consent was obtained and witnessed. The patient was placed in the supine position on the angiography table and the skin of right groin prepped in the usual sterile fashion. The procedure was performed under general anesthesia. A 5-French sheath was introduced in the right common femoral artery using Seldinger technique. MEDICATIONS: HEPARIN: 3000 Units total. VERAPAMIL: '15mg'$  Intra-arterial NITROGLYCERIN: Approximately 1038mg intra-arterial CONTRAST:  867mOMNIPAQUE IOHEXOL 300 MG/ML SOLN, 1044mMNIPAQUE IOHEXOL 300 MG/ML SOLNcc, Omnipaque 300 FLUOROSCOPY TIME:  FLUOROSCOPY TIME: See IR records TECHNIQUE: CATHETERS AND WIRES 5-French envoy MPD guide catheter Transform [place] 4mm5m7mm 57mloon catheter Transform C 3mm x54mmm b9mon catheter VESSELS CATHETERIZED Right internal carotid Left internal carotid Left vertebral Basilar artery Right common femoral VESSELS STUDIED Right internal carotid, head Left internal carotid, head Left vertebral Right common femoral PROCEDURAL NARRATIVE A 5-Fr MPD guide catheter was introduced over the Glidewire and advanced over the aortic arch. 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 Initially, 10 mg of verapamil was injected intra-arterially into the right internal  carotid artery. Angiogram did not reveal significant improvement in the spasm. I therefore introduced 1 mg of nitroglycerin into the 1 m heparinized flush back connected to the guide catheter. The 4 mm x 7 mm supra compliant balloon catheter was prepared on the back table. The balloon catheter was then introduced over the microwire and advanced into the supraclinoid internal carotid artery. Multiple inflations of the balloon catheter cause migration of the catheter due to the significant stenosis about the supraclinoid segment. I therefore removed this balloon catheter and the above 3 mm x 15 mm compliant balloon was prepared in a similar fashion and advanced over the microwire. The balloon was then placed across the supraclinoid segment of the internal carotid artery, and successive inflations of the balloon were performed over the supraclinoid segment into the M1 segment on the right. After angioplasty, angiogram was taken which demonstrated significant improvement in the caliber of the supraclinoid internal carotid artery with restoration of flow through the right middle cerebral  artery territory. At this point the balloon catheter was removed, and the guide catheter was withdrawn down into the aortic arch and the left internal carotid artery was catheterized. Angiogram did reveal spasms slightly worsened in comparison to the prior angiogram primarily involving the supraclinoid internal carotid artery. The spasm does also involve the left A1 segment which is relatively small. At this point I elected to proceed with chemical angioplasty as I think it would be extremely difficult to advance a balloon catheter into the left A1 given the angle of origin and the very small size of the vessel. 5 mg of verapamil was therefore introduced intra-arterially. At this point the guide catheter was used to select the left vertebral artery. Angiogram revealed severe spasm of the basilar artery. The glidewire was introduced and  the guide catheter was tracked over the Glidewire into the distal V2 segment. Under roadmap guidance, the above 3 mm x 15 mm compliant balloon was introduced over the microwire and advanced into the basilar artery. Again with multiple successive inflations of the balloon, the basilar artery was angioplasty. Final angiogram was taken revealing improvement in the caliber of the basilar artery and perfusion of the posterior circulation. Balloon catheter was then removed and final angiogram was taken. The guide catheter was then removed without incident. FINDINGS: Right internal carotid, head: Injection reveals significantly decreased flow through the right internal carotid artery, although the carotid is patent to the supraclinoid segment. There is significant flow limitation and contrast stasis within this vessel. There is minimal change in amount of flow or vessel caliber after administration of 10 mg of verapamil. Angiograms taken after balloon angioplasty of the supraclinoid internal carotid artery and the proximal M1 reveal improved caliber of the supraclinoid internal carotid artery. No filling defects or dissection flaps are identified. There is the restoration of flow through the right middle cerebral artery territory post angioplasty. Left internal carotid, head: Injection reveals the presence of a patent internal carotid artery, with moderate vasospasm involving the supraclinoid segment. In comparison to the prior angiogram, flow to the contralateral ACA and MCA territory is no longer visualized. Angiogram taken after administration of 5 mg of verapamil shows minimal change in vessel caliber. There is flash filling of the contralateral ACA territory however. Left vertebral: Injection reveals the presence of a patent left vertebral artery, with severe vasospasm involving the length of the basilar artery. Post angioplasty angiogram reveals significant improvement in the caliber of the basilar artery, with  improved flow through the posterior circulation. No filling defects or dissection flaps are identified. Right femoral: Normal vessel. No significant atherosclerotic disease. Arterial sheath in adequate position. DISPOSITION: Upon completion of the study, the femoral sheath was removed and hemostasis obtained using a 5-Fr ExoSeal closure device. Good proximal and distal lower extremity pulses were documented upon achievement of hemostasis. The procedure was well tolerated and no early complications were observed. The patient was transferred to the postanesthesia care unit in stable hemodynamic condition. IMPRESSION: 1. 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 There is significant improvement in vessel caliber and distal territory perfusion after balloon angioplasty of the right internal carotid artery, right middle cerebral artery, and basilar artery. The preliminary results of this procedure were shared with the patient's family. Electronically Signed   By: Consuella Lose   On: 01/18/2020 14:28   IR ENDOVASC INTRACRANIAL INF OTHER THAN THROMBO ART INC DIAG ANGIO EA ADD  Result Date:  01/18/2020 PROCEDURE: DIAGNOSTIC CEREBRAL ANGIOGRAM BALLOON ANGIOPLASTY OF RIGHT INTERNAL CAROTID ARTERY BALLOON ANGIOPLASTY OF RIGHT MIDDLE CEREBRAL ARTERY BALLOON ANGIOPLASTY OF BASILAR ARTERY CHEMICAL ANGIOPLASTY OF RIGHT INTERNAL CAROTID ARTERY CHEMICAL ANGIOPLASTY OF LEFT INTERNAL CAROTID ARTERY HISTORY: The patient is a 69 year old man on his fourth hospital day admitted for subarachnoid hemorrhage. He initially underwent angiogram a few days ago which was negative for intracranial aneurysm however at that time did demonstrate significant primarily right-sided supraclinoid internal carotid artery vasospasm. The patient has been monitored in the intensive care unit with stable neurologic exam until this morning when he was noted to  have progressively worsening left hemiplegia, hemi neglect, and gaze preference to the right. 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 He therefore presents emergently for diagnostic cerebral angiogram and likely angioplasty. ACCESS: The technical aspects of the procedure as well as its potential risks and benefits were reviewed with the patient's wife. These risks included but were not limited to stroke, intracranial hemorrhage, bleeding, infection, allergic reaction, damage to organs or vital structures, stroke, non-diagnostic procedure, and the catastrophic outcomes of heart attack, coma, and death. With an understanding of these risks, informed consent was obtained and witnessed. The patient was placed in the supine position on the angiography table and the skin of right groin prepped in the usual sterile fashion. The procedure was performed under general anesthesia. A 5-French sheath was introduced in the right common femoral artery using Seldinger technique. MEDICATIONS: HEPARIN: 3000 Units total. VERAPAMIL: '15mg'$  Intra-arterial NITROGLYCERIN: Approximately 1071mg intra-arterial CONTRAST:  822mOMNIPAQUE IOHEXOL 300 MG/ML SOLN, 1065mMNIPAQUE IOHEXOL 300 MG/ML SOLNcc, Omnipaque 300 FLUOROSCOPY TIME:  FLUOROSCOPY TIME: See IR records TECHNIQUE: CATHETERS AND WIRES 5-French envoy MPD guide catheter Transform [place] 4mm38m7mm 7mloon catheter Transform C 3mm x30mmm b55mon catheter VESSELS CATHETERIZED Right internal carotid Left internal carotid Left vertebral Basilar artery Right common femoral VESSELS STUDIED Right internal carotid, head Left internal carotid, head Left vertebral Right common femoral PROCEDURAL NARRATIVE A 5-Fr MPD guide catheter was introduced over the Glidewire and advanced over the aortic arch. The right common carotid artery was catheterized, and angiogram was taken revealing severe right-sided supraclinoid internal carotid artery  spasm. Initially, 10 mg of verapamil was injected intra-arterially into the right internal carotid artery. Angiogram did not reveal significant improvement in the spasm. I therefore introduced 1 mg of nitroglycerin into the 1 m heparinized flush back connected to the guide catheter. The 4 mm x 7 mm supra compliant balloon catheter was prepared on the back table. The balloon catheter was then introduced over the microwire and advanced into the supraclinoid internal carotid artery. Multiple inflations of the balloon catheter cause migration of the catheter due to the significant stenosis about the supraclinoid segment. I therefore removed this balloon catheter and the above 3 mm x 15 mm compliant balloon was prepared in a similar fashion and advanced over the microwire. The balloon was then placed across the supraclinoid segment of the internal carotid artery, and successive inflations of the balloon were performed over the supraclinoid segment into the M1 segment on the right. After angioplasty, angiogram was taken which demonstrated significant improvement in the caliber of the supraclinoid internal carotid artery with restoration of flow through the right middle cerebral artery territory. At this point the balloon catheter was removed, and the guide catheter was withdrawn down into the aortic arch and the left internal carotid artery was catheterized. Angiogram did reveal spasms slightly worsened  in comparison to the prior angiogram primarily involving the supraclinoid internal carotid artery. The spasm does also involve the left A1 segment which is relatively small. At this point I elected to proceed with chemical angioplasty as I think it would be extremely difficult to advance a balloon catheter into the left A1 given the angle of origin and the very small size of the vessel. 5 mg of verapamil was therefore introduced intra-arterially. At this point the guide catheter was used to select the left vertebral  artery. Angiogram revealed severe spasm of the basilar artery. The glidewire was introduced and the guide catheter was tracked over the Glidewire into the distal V2 segment. Under roadmap guidance, the above 3 mm x 15 mm compliant balloon was introduced over the microwire and advanced into the basilar artery. Again with multiple successive inflations of the balloon, the basilar artery was angioplasty. Final angiogram was taken revealing improvement in the caliber of the basilar artery and perfusion of the posterior circulation. Balloon catheter was then removed and final angiogram was taken. The guide catheter was then removed without incident. FINDINGS: Right internal carotid, head: Injection reveals significantly decreased flow through the right internal carotid artery, although the carotid is patent to the supraclinoid segment. There is significant flow limitation and contrast stasis within this vessel. There is minimal change in amount of flow or vessel caliber after administration of 10 mg of verapamil. Angiograms taken after balloon angioplasty of the supraclinoid internal carotid artery and the proximal M1 reveal improved caliber of the supraclinoid internal carotid artery. No filling defects or dissection flaps are identified. There is the restoration of flow through the right middle cerebral artery territory post angioplasty. Left internal carotid, head: Injection reveals the presence of a patent internal carotid artery, with moderate vasospasm involving the supraclinoid segment. In comparison to the prior angiogram, flow to the contralateral ACA and MCA territory is no longer visualized. Angiogram taken after administration of 5 mg of verapamil shows minimal change in vessel caliber. There is flash filling of the contralateral ACA territory however. Left vertebral: Injection reveals the presence of a patent left vertebral artery, with severe vasospasm involving the length of the basilar artery. Post  angioplasty angiogram reveals significant improvement in the caliber of the basilar artery, with improved flow through the posterior circulation. No filling defects or dissection flaps are identified. Right femoral: Normal vessel. No significant atherosclerotic disease. Arterial sheath in adequate position. DISPOSITION: Upon completion of the study, the femoral sheath was removed and hemostasis obtained using a 5-Fr ExoSeal closure device. Good proximal and distal lower extremity pulses were documented upon achievement of hemostasis. The procedure was well tolerated and no early complications were observed. The patient was transferred to the postanesthesia care unit in stable hemodynamic condition. IMPRESSION: 1. 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 There is significant improvement in vessel caliber and distal territory perfusion after balloon angioplasty of the right internal carotid artery, right middle cerebral artery, and basilar artery. The preliminary results of this procedure were shared with the patient's family. Electronically Signed   By: Consuella Lose   On: 01/18/2020 14:28   VAS [place] TRANSCRANIAL DOPPLER  Result Date: 02/01/2020  Transcranial Doppler Indications: Subarachnoid hemorrhage. Comparison Study: 01/30/20 previous Performing Technologist: Abram Sander RVS  Examination Guidelines: A complete evaluation includes B-mode imaging, spectral Doppler, color Doppler, and power Doppler as needed of all accessible portions of each vessel. Bilateral testing is  considered an integral part of a complete examination. Limited examinations for reoccurring indications may be performed as noted.  +----------+-------------+----------+-----------+-------+ RIGHT TCD Right VM (cm)Depth (cm)PulsatilityComment +----------+-------------+----------+-----------+-------+ MCA          132.00                 0.80             +----------+-------------+----------+-----------+-------+ ACA          -19.00                 0.88            +----------+-------------+----------+-----------+-------+ PCA           26.00                 0.91            +----------+-------------+----------+-----------+-------+ Opthalmic     23.00                 1.87            +----------+-------------+----------+-----------+-------+ ICA siphon    41.00                 1.02            +----------+-------------+----------+-----------+-------+ Vertebral    -43.00                 1.33            +----------+-------------+----------+-----------+-------+  +----------+------------+----------+-----------+-------+ LEFT TCD  Left VM (cm)Depth (cm)PulsatilityComment +----------+------------+----------+-----------+-------+ MCA          58.00                 0.85            +----------+------------+----------+-----------+-------+ Term ICA     20.00                 1.20            +----------+------------+----------+-----------+-------+ PCA          33.00                 0.69            +----------+------------+----------+-----------+-------+ Opthalmic    17.00                 1.81            +----------+------------+----------+-----------+-------+ ICA siphon   120.00                1.14            +----------+------------+----------+-----------+-------+ Vertebral    -47.00                1.43            +----------+------------+----------+-----------+-------+  +------------+-------+-------+             VM cm/sComment +------------+-------+-------+ Prox Basilar-51.00         +------------+-------+-------+ Summary:  Persistent elevated right middle cerebral and left carotid siphon mean flow velocities suggest mild vasospasm.Normal mean flow velocities in remaining identified vessels of anterior and posterior cerebral circulation *See table(s) above for TCD measurements and observations.   Diagnosing physician: Antony Contras MD Electronically signed by Antony Contras MD on 02/01/2020 at 5:07:16 PM.    Final    VAS [place] TRANSCRANIAL DOPPLER  Result Date: 01/30/2020  Transcranial Doppler Indications: Subarachnoid hemorrhage. Limitations: Patient positioning, patient movement Limitations for diagnostic windows: Unable to insonate left transtemporal window. Unable  to insonate foramen window. Comparison Study: 01/28/2020 Performing Technologist: Oliver Hum RVT  Examination Guidelines: A complete evaluation includes B-mode imaging, spectral Doppler, color Doppler, and power Doppler as needed of all accessible portions of each vessel. Bilateral testing is considered an integral part of a complete examination. Limited examinations for reoccurring indications may be performed as noted.  +----------+-------------+----------+-----------+-------+ RIGHT TCD Right VM (cm)Depth (cm)PulsatilityComment +----------+-------------+----------+-----------+-------+ MCA           99.00                 1.35            +----------+-------------+----------+-----------+-------+ ACA          -24.00                 1.69            +----------+-------------+----------+-----------+-------+ Term ICA     122.00                  1.3            +----------+-------------+----------+-----------+-------+ PCA           34.00                 1.55            +----------+-------------+----------+-----------+-------+ Opthalmic     34.00                 2.23            +----------+-------------+----------+-----------+-------+ ICA siphon    72.00                 1.36            +----------+-------------+----------+-----------+-------+  +----------+------------+----------+-----------+-------+ LEFT TCD  Left VM (cm)Depth (cm)PulsatilityComment +----------+------------+----------+-----------+-------+ MCA          47.00                 1.29             +----------+------------+----------+-----------+-------+ PCA          22.00                 2.02            +----------+------------+----------+-----------+-------+ Opthalmic    24.00                 2.14            +----------+------------+----------+-----------+-------+ ICA siphon   45.00                 1.69            +----------+------------+----------+-----------+-------+  Summary:  Elevated right middle cerebral and right terminal carotid artery mean flow velocities suggest mild vasospasm but improved from last study.Normal mean flow velocities in remaining identified vessels of anterior and posterior cerebral circulations. *See table(s) above for TCD measurements and observations.  Diagnosing physician: Antony Contras MD Electronically signed by Antony Contras MD on 01/30/2020 at 4:02:18 PM.    Final    VAS [place] TRANSCRANIAL DOPPLER  Result Date: 01/28/2020  Transcranial Doppler Indications: Subarachnoid hemorrhage. Limitations for diagnostic windows: Unable to insonate occipital window. Comparison Study: 01/23/2020 Performing Technologist: Oliver Hum RVT  Examination Guidelines: A complete evaluation includes B-mode imaging, spectral Doppler, color Doppler, and power Doppler as needed of all accessible portions of each vessel. Bilateral testing is considered an integral part of a complete examination. Limited examinations for reoccurring indications may be performed as noted.  +----------+-------------+----------+-----------+-------------------+  RIGHT TCD Right VM (cm)Depth (cm)Pulsatility      Comment       +----------+-------------+----------+-----------+-------------------+ MCA          107.00                 1.08                        +----------+-------------+----------+-----------+-------------------+ Term ICA      25.00                 2.21                        +----------+-------------+----------+-----------+-------------------+ PCA           39.00                  1.45                        +----------+-------------+----------+-----------+-------------------+ Opthalmic     24.00                 2.04                        +----------+-------------+----------+-----------+-------------------+ ICA siphon    66.00                 1.38                        +----------+-------------+----------+-----------+-------------------+ Vertebral                                   Unable to visualize +----------+-------------+----------+-----------+-------------------+  +----------+------------+----------+-----------+-------------------+ LEFT TCD  Left VM (cm)Depth (cm)Pulsatility      Comment       +----------+------------+----------+-----------+-------------------+ MCA          72.00                 1.47                        +----------+------------+----------+-----------+-------------------+ Term ICA     30.00                 1.38                        +----------+------------+----------+-----------+-------------------+ PCA          32.00                 1.48                        +----------+------------+----------+-----------+-------------------+ Opthalmic    27.00                 1.98                        +----------+------------+----------+-----------+-------------------+ ICA siphon   59.00                  1.9                        +----------+------------+----------+-----------+-------------------+ Vertebral  Unable to visualize +----------+------------+----------+-----------+-------------------+  +------------+-------+-------------------+             VM cm/s      Comment       +------------+-------+-------------------+ Prox Basilar       Unable to visualize +------------+-------+-------------------+ Dist Basilar       Unable to visualize +------------+-------+-------------------+ Summary:  Elevated right middle cerebral artery mean flow velocities suggest mild  vasospam.Not all vessels could be studied due to technical difficulties.Elevated pulsatility indices globally suggest increase intracranial pressure likely *See table(s) above for TCD measurements and observations.  Diagnosing physician: Antony Contras MD Electronically signed by Antony Contras MD on 01/28/2020 at 2:09:40 PM.    Final    VAS [place] TRANSCRANIAL DOPPLER  Result Date: 01/28/2020  Transcranial Doppler Indications: Subarachnoid hemorrhage. Performing Technologist: June Leap RDMS, RVT  Examination Guidelines: A complete evaluation includes B-mode imaging, spectral Doppler, color Doppler, and power Doppler as needed of all accessible portions of each vessel. Bilateral testing is considered an integral part of a complete examination. Limited examinations for reoccurring indications may be performed as noted.  +----------+-------------+----------+-----------+--------------+ RIGHT TCD Right VM (cm)Depth (cm)Pulsatility   Comment     +----------+-------------+----------+-----------+--------------+ MCA          191.00                 1.33                   +----------+-------------+----------+-----------+--------------+ ACA          -48.00                  0.8                   +----------+-------------+----------+-----------+--------------+ Term ICA      63.00                 1.42                   +----------+-------------+----------+-----------+--------------+ PCA           45.00                 1.24                   +----------+-------------+----------+-----------+--------------+ Opthalmic     36.00                 2.07                   +----------+-------------+----------+-----------+--------------+ ICA siphon    84.00                 1.36                   +----------+-------------+----------+-----------+--------------+ Vertebral                                   not visualized +----------+-------------+----------+-----------+--------------+   +----------+------------+----------+-----------+--------------+ LEFT TCD  Left VM (cm)Depth (cm)Pulsatility   Comment     +----------+------------+----------+-----------+--------------+ MCA          89.00                 1.52                   +----------+------------+----------+-----------+--------------+ ACA         -103.00  1.44                   +----------+------------+----------+-----------+--------------+ Term ICA                                   not visualized +----------+------------+----------+-----------+--------------+ PCA          -43.00                1.79                   +----------+------------+----------+-----------+--------------+ Opthalmic    30.00                 1.99                   +----------+------------+----------+-----------+--------------+ ICA siphon   67.00                 1.75                   +----------+------------+----------+-----------+--------------+ Vertebral                                  not visualized +----------+------------+----------+-----------+--------------+  +------------+-------+--------------+             VM cm/s   Comment     +------------+-------+--------------+ Prox Basilar       not visualized +------------+-------+--------------+ Summary:  Persistently elevated mean flow velocities in right middle cerebral suggestive of moderate vasospam and in right anterior cerebral suggestive of mild vasospasm. *See table(s) above for TCD measurements and observations.  Diagnosing physician: Antony Contras MD Electronically signed by Antony Contras MD on 01/28/2020 at 2:06:19 PM.    Final    VAS [place] TRANSCRANIAL DOPPLER  Result Date: 01/24/2020  Transcranial Doppler Indications: Subarachnoid hemorrhage. Performing Technologist: June Leap RDMS, RVT  Examination Guidelines: A complete evaluation includes B-mode imaging, spectral Doppler, color Doppler, and power Doppler as needed of all accessible  portions of each vessel. Bilateral testing is considered an integral part of a complete examination. Limited examinations for reoccurring indications may be performed as noted.  +----------+-------------+----------+-----------+--------------+ RIGHT TCD Right VM (cm)Depth (cm)Pulsatility   Comment     +----------+-------------+----------+-----------+--------------+ MCA          194.00                 1.15                   +----------+-------------+----------+-----------+--------------+ ACA          -80.00                 0.54                   +----------+-------------+----------+-----------+--------------+ Term ICA                                    not visualized +----------+-------------+----------+-----------+--------------+ PCA           55.00                 1.27                   +----------+-------------+----------+-----------+--------------+ Opthalmic     28.00  1.62                   +----------+-------------+----------+-----------+--------------+ ICA siphon    66.00                 1.03                   +----------+-------------+----------+-----------+--------------+ Vertebral    -37.00                 1.05                   +----------+-------------+----------+-----------+--------------+  +----------+------------+----------+-----------+--------------+ LEFT TCD  Left VM (cm)Depth (cm)Pulsatility   Comment     +----------+------------+----------+-----------+--------------+ MCA          81.00                 1.43                   +----------+------------+----------+-----------+--------------+ ACA          -87.00                1.21                   +----------+------------+----------+-----------+--------------+ PCA          34.00                 1.46                   +----------+------------+----------+-----------+--------------+ Opthalmic    26.00                  2.3                    +----------+------------+----------+-----------+--------------+ ICA siphon   80.00                 1.81                   +----------+------------+----------+-----------+--------------+ Vertebral                                  not visualized +----------+------------+----------+-----------+--------------+  +------------+-------+--------------+             VM cm/s   Comment     +------------+-------+--------------+ Prox Basilar       not visualized +------------+-------+--------------+ Summary:  Elevated right middle cerebral artery mean flow velocities suggest moderate and worsening vasospasm. Mildly elevated left middle cerebral, and left carotid siphon mean flow velocities of unclear significance. *See table(s) above for TCD measurements and observations.  Diagnosing physician: Antony Contras MD Electronically signed by Antony Contras MD on 01/24/2020 at 8:15:01 AM.    Final    VAS [place] TRANSCRANIAL DOPPLER  Result Date: 01/22/2020  Transcranial Doppler Indications: Subarachnoid hemorrhage. Comparison Study: 01/19/20 previous Performing Technologist: Abram Sander RVS Supporting Technologist: Sharion Dove RVS  Examination Guidelines: A complete evaluation includes B-mode imaging, spectral Doppler, color Doppler, and power Doppler as needed of all accessible portions of each vessel. Bilateral testing is considered an integral part of a complete examination. Limited examinations for reoccurring indications may be performed as noted.  +----------+-------------+----------+-----------+-------+ RIGHT TCD Right VM (cm)Depth (cm)PulsatilityComment +----------+-------------+----------+-----------+-------+ MCA          127.00                 1.13            +----------+-------------+----------+-----------+-------+  ACA          -14.00                 1.22            +----------+-------------+----------+-----------+-------+ Term ICA     108.00                 1.54             +----------+-------------+----------+-----------+-------+ PCA           36.00                 1.35            +----------+-------------+----------+-----------+-------+ Opthalmic     47.00                 2.02            +----------+-------------+----------+-----------+-------+ ICA siphon    36.00                 1.13            +----------+-------------+----------+-----------+-------+ Vertebral    -42.00                 1.29            +----------+-------------+----------+-----------+-------+  +----------+------------+----------+-----------+-------+ LEFT TCD  Left VM (cm)Depth (cm)PulsatilityComment +----------+------------+----------+-----------+-------+ MCA          59.00                 1.37            +----------+------------+----------+-----------+-------+ Term ICA     43.00                 1.30            +----------+------------+----------+-----------+-------+ Opthalmic    34.00                 1.36            +----------+------------+----------+-----------+-------+ ICA siphon   103.00                1.10            +----------+------------+----------+-----------+-------+ Vertebral    -18.00                0.97            +----------+------------+----------+-----------+-------+  +------------+-------+-------+             VM cm/sComment +------------+-------+-------+ Prox Basilar-62.00         +------------+-------+-------+ Summary:  Mildly elevated right middlle crebral and bilateral carotid siphon mean flow velocities indicate mild vasospasm. Normal mean flow velocities in remaining identified vessels of anterior and posterior cerebral circulations.Globally elevated pulsatility indices indicate increase in intracranial pressure likely. *See table(s) above for TCD measurements and observations.  Diagnosing physician: Antony Contras MD Electronically signed by Antony Contras MD on 01/22/2020 at 8:57:11 AM.    Final    VAS [place] TRANSCRANIAL  DOPPLER  Result Date: 01/21/2020  Transcranial Doppler Indications: Subarachnoid hemorrhage. Limitations for diagnostic windows: Unable to insonate right transtemporal window. Unable to insonate left transtemporal window. Comparison Study: 01/16/2020 Performing Technologist: Oliver Hum RVT  Examination Guidelines: A complete evaluation includes B-mode imaging, spectral Doppler, color Doppler, and power Doppler as needed of all accessible portions of each vessel. Bilateral testing is considered an integral part of a complete examination. Limited examinations for reoccurring indications may be performed as noted.  +----------+-------------+----------+-----------+------------------------+ RIGHT TCD Right VM (cm)Depth (  cm)Pulsatility        Comment          +----------+-------------+----------+-----------+------------------------+ MCA           96.00                 0.79                             +----------+-------------+----------+-----------+------------------------+ Term ICA      42.00                 1.65                             +----------+-------------+----------+-----------+------------------------+ PCA           30.00                 0.67                             +----------+-------------+----------+-----------+------------------------+ Opthalmic     30.00                 2.25    High resistive waveforms +----------+-------------+----------+-----------+------------------------+ ICA siphon    14.00                 1.59    High resistive waveforms +----------+-------------+----------+-----------+------------------------+ Vertebral    -60.00                 1.25                             +----------+-------------+----------+-----------+------------------------+  +----------+------------+----------+-----------+-------------------------+ LEFT TCD  Left VM (cm)Depth (cm)Pulsatility         Comment           +----------+------------+----------+-----------+-------------------------+ MCA          50.00                 1.21                              +----------+------------+----------+-----------+-------------------------+ PCA          37.00                 1.08                              +----------+------------+----------+-----------+-------------------------+ Opthalmic    25.00                 2.08    High resistive waveforms. +----------+------------+----------+-----------+-------------------------+ ICA siphon   95.00                  1.3                              +----------+------------+----------+-----------+-------------------------+ Vertebral    -22.00                1.15                              +----------+------------+----------+-----------+-------------------------+  +------------+-------+-------+             VM cm/sComment +------------+-------+-------+ Prox Basilar-63.00  1.26   +------------+-------+-------+  Summary:  Improvement in vasospasm overall but persistent elevated right middle cerebral and left carotid siphon mean flow velocities suggestive of mild vasospasm. *See table(s) above for TCD measurements and observations.  Diagnosing physician: Antony Contras MD Electronically signed by Antony Contras MD on 01/21/2020 at 8:29:56 AM.    Final    VAS [place] TRANSCRANIAL DOPPLER  Result Date: 01/16/2020  Transcranial Doppler Indications: Subarachnoid hemorrhage. History: ?? Intracranial aneurysms - significant vasospasm involving the distal supraclinoid right ICA and proximal right M1 on 4/27 IR angio. Performing Technologist: Oda Cogan RDMS, RVT  Examination Guidelines: A complete evaluation includes B-mode imaging, spectral Doppler, color Doppler, and power Doppler as needed of all accessible portions of each vessel. Bilateral testing is considered an integral part of a complete examination. Limited examinations for reoccurring indications may be  performed as noted.  +----------+-------------+----------+-----------+-------+ RIGHT TCD Right VM (cm)Depth (cm)PulsatilityComment +----------+-------------+----------+-----------+-------+ MCA          134.00      54.00       1.1            +----------+-------------+----------+-----------+-------+ ACA          -31.00                  1.1            +----------+-------------+----------+-----------+-------+ Term ICA      53.00                  .72            +----------+-------------+----------+-----------+-------+ PCA           38.00                  1.5            +----------+-------------+----------+-----------+-------+ Opthalmic     57.00                  2.2            +----------+-------------+----------+-----------+-------+ ICA siphon   153.00      69.00      0.87            +----------+-------------+----------+-----------+-------+ Vertebral    -18.00                                 +----------+-------------+----------+-----------+-------+ Distal ICA    40.00                                 +----------+-------------+----------+-----------+-------+  +----------+------------+----------+-----------+------------------+ LEFT TCD  Left VM (cm)Depth (cm)Pulsatility     Comment       +----------+------------+----------+-----------+------------------+ MCA          131.00     81.00       1.0    contralateral side +----------+------------+----------+-----------+------------------+ ACA          -92.00     60.00       1.0                       +----------+------------+----------+-----------+------------------+ Term ICA     37.00                  1.4                       +----------+------------+----------+-----------+------------------+ PCA  24.00                  1.1            P2         +----------+------------+----------+-----------+------------------+ Opthalmic    50.00                  1.7                        +----------+------------+----------+-----------+------------------+ ICA siphon   136.00     73.00       1.0                       +----------+------------+----------+-----------+------------------+ Vertebral    -31.00                                           +----------+------------+----------+-----------+------------------+ Distal ICA   21.00                                            +----------+------------+----------+-----------+------------------+  +------------+-------+-------------+             VM cm/s   Comment    +------------+-------+-------------+ Prox Basilar-42.00               +------------+-------+-------------+ Dist Basilar       not insonated +------------+-------+-------------+ +----------------------+---+ Right Lindegaard Ratio3.3 +----------------------+---+ +---------------------+---+ Left Lindegaard Ratio6.2 +---------------------+---+  Summary:  Elevated right middle cerebral and carotid siphon and left middle cerebral artery and carotid siphon mean flow velocities suggest mild vasospasm. Slight improvement compared with prior TCD study 01/14/20 *See table(s) above for TCD measurements and observations.  Diagnosing physician: Antony Contras MD Electronically signed by Antony Contras MD on 01/16/2020 at 5:31:27 PM.    Final    VAS [place] TRANSCRANIAL DOPPLER  Result Date: 01/16/2020  Transcranial Doppler Indications: Subarachnoid hemorrhage. Comparison Study: No prior study Performing Technologist: Maudry Mayhew MHA, RDMS, RVT, RDCS  Examination Guidelines: A complete evaluation includes B-mode imaging, spectral Doppler, color Doppler, and power Doppler as needed of all accessible portions of each vessel. Bilateral testing is considered an integral part of a complete examination. Limited examinations for reoccurring indications may be performed as noted.  +----------+-------------+----------+-----------+-------+ RIGHT TCD Right VM (cm)Depth  (cm)PulsatilityComment +----------+-------------+----------+-----------+-------+ MCA          197.00                 1.08            +----------+-------------+----------+-----------+-------+ Term ICA     163.00                 1.02            +----------+-------------+----------+-----------+-------+ PCA           12.00                 3.00            +----------+-------------+----------+-----------+-------+ Opthalmic     27.00                 2.11            +----------+-------------+----------+-----------+-------+ ICA siphon   150.00  1.03            +----------+-------------+----------+-----------+-------+ Vertebral    -23.00                 1.58            +----------+-------------+----------+-----------+-------+ Distal ICA    27.00                 1.92            +----------+-------------+----------+-----------+-------+  +----------+------------+----------+-----------+-------+ LEFT TCD  Left VM (cm)Depth (cm)PulsatilityComment +----------+------------+----------+-----------+-------+ MCA          64.00                 1.67            +----------+------------+----------+-----------+-------+ ACA         -105.00                1.03            +----------+------------+----------+-----------+-------+ Term ICA     112.00                0.81            +----------+------------+----------+-----------+-------+ PCA          17.00                 1.34            +----------+------------+----------+-----------+-------+ Opthalmic    25.00                 1.62            +----------+------------+----------+-----------+-------+ ICA siphon   29.00                 1.29            +----------+------------+----------+-----------+-------+ Vertebral    -31.00                1.50            +----------+------------+----------+-----------+-------+ Distal ICA   38.00                 1.20             +----------+------------+----------+-----------+-------+  +------------+-------+-------+             VM cm/sComment +------------+-------+-------+ Prox Basilar-36.00         +------------+-------+-------+ +----------------------+---+ Right Lindegaard Ratio7.3 +----------------------+---+ +---------------------+----+ Left Lindegaard Ratio1.68 +---------------------+----+  Summary:  Elevated mean flow velocities in right middle cerebral, terminal ICA and carotid siphon suggestive of moderate vasospasm and elevated left anterior cerebral and terminal ICA mean flow velocities suggest mild vasospasm. *See table(s) above for TCD measurements and observations.  Diagnosing physician: Antony Contras MD Electronically signed by Antony Contras MD on 01/16/2020 at 6:52:29 AM.    Final    IR ANGIO INTRA EXTRACRAN SEL INTERNAL CAROTID BILAT MOD SED  Result Date: 01/31/2020 PROCEDURE: DIAGNOSTIC CEREBRAL ANGIOGRAM HISTORY: The patient is a 69 year old and hospital with subarachnoid hemorrhage. Previous angiograms were negative for saccular aneurysm, with severe vasospasm seen on most recent angiogram. The patient has been monitored in the intensive care unit with stable neurologic exam. He presents today for follow-up diagnostic cerebral angiogram to assess both status of vasospasm and source for subarachnoid hemorrhage. ACCESS: The technical aspects of the procedure as well as its potential risks and benefits were reviewed with the patient's family. These risks included but were not limited bleeding, infection, allergic reaction, damage to organs or vital structures,  stroke, non-diagnostic procedure, and the catastrophic outcomes of heart attack, coma, and death. With an understanding of these risks, informed consent was obtained and witnessed. The patient was placed in the supine position on the angiography table and the skin of right groin prepped in the usual sterile fashion. The procedure was performed under  local anesthesia (1%-solution of bicarbonate-buffered Lidocaine) and conscious sedation with 12.56mcrograms fentanyl monitored by myself and the in-suite nurse using continuous pulse-oximetry, heart rate, and non-invasive blood-pressure. A 5- French sheath was introduced in the right common femoral artery using Seldinger technique. A fluoro-phase sequence was used to document the sheath position. MEDICATIONS: HEPARIN: 0 Units total. CONTRAST:  cc, Omnipaque 300 FLUOROSCOPY TIME:  FLUOROSCOPY TIME: See IR records TECHNIQUE: CATHETERS AND WIRES 5-French JB-1 catheter 0.035" glidewire VESSELS CATHETERIZED Right internal carotid Left internal carotid Left vertebral Right vertebral Right common femoral VESSELS STUDIED Right internal carotid, head Left internal carotid, head Left vertebral Right vertebral Right femoral PROCEDURAL NARRATIVE A 5-Fr JB-1 catheter was advanced over a 0.035 glidewire into the aortic arch. The above vessels were then sequentially catheterized and cervical / cerebral angiograms taken. After review of images, the catheter was removed without incident. FINDINGS: Right internal carotid, head: Injection reveals the presence of a patent ICA, M1, and A1 segments and their branches. The right A1 is again noted to be somewhat small in caliber. Overall, degree of vasospasm the supraclinoid internal carotid artery is significant in comparison to the prior angiogram. No saccular aneurysms, arteriovenous malformations, or fistulas are seen. There is non uniform opacification of the supraclinoid segment of the ICA with suggestion of possible dissection flap. Nonetheless, there is no significant flow limitation. The parenchymal and venous phases are normal. The venous sinuses are widely patent. Left internal carotid, head: Injection reveals the presence of a widely patent ICA, A1, and M1 segments and their branches. There is moderate spasm involving the supraclinoid segment of the left internal carotid artery  without flow limitation. No aneurysms, AVMs, or high-flow fistulas are seen. The parenchymal and venous phases are normal. The venous sinuses are widely patent. Left vertebral: Injection reveals the presence of a widely patent vertebral artery. This leads to a widely patent basilar artery that terminates in right P1. The basilar apex is normal. No aneurysms, AVMs, or high-flow fistulas are seen. Severe vasospasm of the basilar artery on the previous angiogram has largely resolved. The parenchymal and venous phases are normal. The venous sinuses are widely patent. Right vertebral: The vertebral artery is patent with mild to moderate spasm involving the distal portion of the right V4 segment. No PICA aneurysm is seen. See basilar description above. Right femoral: Normal vessel. No significant atherosclerotic disease. Arterial sheath in adequate position. DISPOSITION: Upon completion of the study, the femoral sheath was removed and hemostasis obtained using a 5-Fr ExoSeal closure device. Good proximal and distal lower extremity pulses were documented upon achievement of hemostasis. The procedure was well tolerated and no early complications were observed. The patient was transferred to the holding area to lay flat for 2 hours. IMPRESSION: 1. No saccular aneurysms, arteriovenous malformations, or high-flow fistulas are identified. There is decreased opacification of the supraclinoid segment of the right internal carotid artery which may suggest the presence of a dissection, and pseudoaneurysm of the source of hemorrhage. There is no significant flow limitation. 2. Significant overall improvement in the degree of intracranial vasospasm in comparison to the prior angiogram. The preliminary results of this procedure were shared with the patient and the  patient's family. Electronically Signed   By: Consuella Lose   On: 01/31/2020 13:48   IR ANGIO INTRA EXTRACRAN SEL INTERNAL CAROTID BILAT MOD SED  Result Date:  01/15/2020 PROCEDURE: DIAGNOSTIC CEREBRAL ANGIOGRAM HISTORY: The patient is a 69 year old man presenting to the hospital with sudden onset of severe neck pain headache without other focal neurologic deficit. CT scan demonstrated diffuse basal subarachnoid hemorrhage while CT angiogram was negative. He therefore presents today for further workup with diagnostic cerebral angiogram. ACCESS: The technical aspects of the procedure as well as its potential risks and benefits were reviewed with the patient. These risks included but were not limited bleeding, infection, allergic reaction, damage to organs or vital structures, stroke, non-diagnostic procedure, and the catastrophic outcomes of heart attack, coma, and death. With an understanding of these risks, informed consent was obtained and witnessed. The patient was placed in the supine position on the angiography table and the skin of right groin prepped in the usual sterile fashion. The procedure was performed under local anesthesia (1%-solution of bicarbonate-buffered Lidocaine) and conscious sedation administered by the anesthesia service. A 5- French sheath was introduced in the right common femoral artery using Seldinger technique. A fluoro-phase sequence was used to document the sheath position. MEDICATIONS: HEPARIN: 0 Units total. CONTRAST:  cc, Omnipaque 300 FLUOROSCOPY TIME:  FLUOROSCOPY TIME: See IR records TECHNIQUE: CATHETERS AND WIRES 5-French JB-1 catheter 0.035" glidewire VESSELS CATHETERIZED Right internal carotid Right external carotid Left internal carotid Left external carotid Left vertebral Right vertebral Right common femoral VESSELS STUDIED Right internal carotid, head Right external carotid head Left internal carotid, head Left external carotid, head Left vertebral Right vertebral Right femoral PROCEDURAL NARRATIVE A 5-Fr JB-1 catheter was advanced over a 0.035 glidewire into the aortic arch. The above vessels were then sequentially catheterized  and cervical / cerebral angiograms taken. After review of images, the catheter was removed without incident. FINDINGS: Right internal carotid, head: Injection reveals the presence of a widely patent ICA, M1, and A1 segments and their branches. The distal supraclinoid segment the right internal carotid artery is significantly tapered suggesting significant vasospasm with the ICA terminus measuring 1.3 mm in maximal dimension. There is also significant narrowing of the proximal portions of the right M1. The anterior cerebral artery on the right and its distal territory is not visualized on this right carotid injection. There is significant washout of the right middle cerebral artery territory due to sluggish flow and likely collateral flow from the contralateral internal carotid. No aneurysms are identified however the right posterior communicating artery is not well visualized due to spasm. The venous sinuses are widely patent. Right external carotid, head: The visualized cranial branches of the right external carotid artery are unremarkable. There is no early intracranial venous drainage identified. Left internal carotid, head: Injection reveals the presence of a widely patent ICA, A1, and M1 segments and their branches. No aneurysms, arteriovenous malformations, or high-flow fistulas are identified. There is good flow through the anterior communicating artery with visualization of the contralateral A1 and contralateral middle cerebral artery territory. The parenchymal and venous phases are normal. The venous sinuses are widely patent. Left external carotid, head: The visualized cranial branches of the left external carotid artery are unremarkable. Early venous drainage is seen Right vertebral: Injection reveals the presence of a widely patent vertebral artery. No luminal regularities or pseudoaneurysm/dissection is seen in the cervical segments of the right vertebral artery. This leads to a widely patent basilar  artery that terminates in right P1.  The left P1 is hypoplastic and not visualized. The basilar apex is normal. No aneurysms AVMs, or fistulas are seen. The parenchymal and venous phases are normal. The venous sinuses are widely patent. Left vertebral: The vertebral artery is widely patent. No cervical dissections or pseudoaneurysms are seen. No PICA aneurysm is seen. See basilar description above. Right femoral: Normal vessel. No significant atherosclerotic disease. Arterial sheath in adequate position. DISPOSITION: Upon completion of the study, the femoral sheath was removed and hemostasis obtained using a 5-Fr ExoSeal closure device. Good proximal and distal lower extremity pulses were documented upon achievement of hemostasis. The procedure was well tolerated and no early complications were observed. The patient was transferred to the intensive care unit to lay flat for 2 hours. IMPRESSION: 1. Significant vasospasm involving the distal supraclinoid right internal carotid artery and proximal right M1. This is the cause of significant flow limitation with good collateral flow from the contralateral carotid through the anterior communicating artery. 2. No aneurysms, AVMs, or high-flow fistulas are identified, although the right posterior communicating artery region is poorly visualized due to the above described vasospasm. The preliminary results of this procedure were shared with the patient and the patient's family. Electronically Signed   By: Consuella Lose   On: 01/15/2020 14:51   IR ANGIO VERTEBRAL SEL VERTEBRAL BILAT MOD SED  Result Date: 01/31/2020 PROCEDURE: DIAGNOSTIC CEREBRAL ANGIOGRAM HISTORY: The patient is a 69 year old and hospital with subarachnoid hemorrhage. Previous angiograms were negative for saccular aneurysm, with severe vasospasm seen on most recent angiogram. The patient has been monitored in the intensive care unit with stable neurologic exam. He presents today for follow-up  diagnostic cerebral angiogram to assess both status of vasospasm and source for subarachnoid hemorrhage. ACCESS: The technical aspects of the procedure as well as its potential risks and benefits were reviewed with the patient's family. These risks included but were not limited bleeding, infection, allergic reaction, damage to organs or vital structures, stroke, non-diagnostic procedure, and the catastrophic outcomes of heart attack, coma, and death. With an understanding of these risks, informed consent was obtained and witnessed. The patient was placed in the supine position on the angiography table and the skin of right groin prepped in the usual sterile fashion. The procedure was performed under local anesthesia (1%-solution of bicarbonate-buffered Lidocaine) and conscious sedation with 12.36mcrograms fentanyl monitored by myself and the in-suite nurse using continuous pulse-oximetry, heart rate, and non-invasive blood-pressure. A 5- French sheath was introduced in the right common femoral artery using Seldinger technique. A fluoro-phase sequence was used to document the sheath position. MEDICATIONS: HEPARIN: 0 Units total. CONTRAST:  cc, Omnipaque 300 FLUOROSCOPY TIME:  FLUOROSCOPY TIME: See IR records TECHNIQUE: CATHETERS AND WIRES 5-French JB-1 catheter 0.035" glidewire VESSELS CATHETERIZED Right internal carotid Left internal carotid Left vertebral Right vertebral Right common femoral VESSELS STUDIED Right internal carotid, head Left internal carotid, head Left vertebral Right vertebral Right femoral PROCEDURAL NARRATIVE A 5-Fr JB-1 catheter was advanced over a 0.035 glidewire into the aortic arch. The above vessels were then sequentially catheterized and cervical / cerebral angiograms taken. After review of images, the catheter was removed without incident. FINDINGS: Right internal carotid, head: Injection reveals the presence of a patent ICA, M1, and A1 segments and their branches. The right A1 is again  noted to be somewhat small in caliber. Overall, degree of vasospasm the supraclinoid internal carotid artery is significant in comparison to the prior angiogram. No saccular aneurysms, arteriovenous malformations, or fistulas are seen. There is non  uniform opacification of the supraclinoid segment of the ICA with suggestion of possible dissection flap. Nonetheless, there is no significant flow limitation. The parenchymal and venous phases are normal. The venous sinuses are widely patent. Left internal carotid, head: Injection reveals the presence of a widely patent ICA, A1, and M1 segments and their branches. There is moderate spasm involving the supraclinoid segment of the left internal carotid artery without flow limitation. No aneurysms, AVMs, or high-flow fistulas are seen. The parenchymal and venous phases are normal. The venous sinuses are widely patent. Left vertebral: Injection reveals the presence of a widely patent vertebral artery. This leads to a widely patent basilar artery that terminates in right P1. The basilar apex is normal. No aneurysms, AVMs, or high-flow fistulas are seen. Severe vasospasm of the basilar artery on the previous angiogram has largely resolved. The parenchymal and venous phases are normal. The venous sinuses are widely patent. Right vertebral: The vertebral artery is patent with mild to moderate spasm involving the distal portion of the right V4 segment. No PICA aneurysm is seen. See basilar description above. Right femoral: Normal vessel. No significant atherosclerotic disease. Arterial sheath in adequate position. DISPOSITION: Upon completion of the study, the femoral sheath was removed and hemostasis obtained using a 5-Fr ExoSeal closure device. Good proximal and distal lower extremity pulses were documented upon achievement of hemostasis. The procedure was well tolerated and no early complications were observed. The patient was transferred to the holding area to lay flat for 2  hours. IMPRESSION: 1. No saccular aneurysms, arteriovenous malformations, or high-flow fistulas are identified. There is decreased opacification of the supraclinoid segment of the right internal carotid artery which may suggest the presence of a dissection, and pseudoaneurysm of the source of hemorrhage. There is no significant flow limitation. 2. Significant overall improvement in the degree of intracranial vasospasm in comparison to the prior angiogram. The preliminary results of this procedure were shared with the patient and the patient's family. Electronically Signed   By: Consuella Lose   On: 01/31/2020 13:48   IR ANGIO VERTEBRAL SEL VERTEBRAL BILAT MOD SED  Result Date: 01/15/2020 PROCEDURE: DIAGNOSTIC CEREBRAL ANGIOGRAM HISTORY: The patient is a 69 year old man presenting to the hospital with sudden onset of severe neck pain headache without other focal neurologic deficit. CT scan demonstrated diffuse basal subarachnoid hemorrhage while CT angiogram was negative. He therefore presents today for further workup with diagnostic cerebral angiogram. ACCESS: The technical aspects of the procedure as well as its potential risks and benefits were reviewed with the patient. These risks included but were not limited bleeding, infection, allergic reaction, damage to organs or vital structures, stroke, non-diagnostic procedure, and the catastrophic outcomes of heart attack, coma, and death. With an understanding of these risks, informed consent was obtained and witnessed. The patient was placed in the supine position on the angiography table and the skin of right groin prepped in the usual sterile fashion. The procedure was performed under local anesthesia (1%-solution of bicarbonate-buffered Lidocaine) and conscious sedation administered by the anesthesia service. A 5- French sheath was introduced in the right common femoral artery using Seldinger technique. A fluoro-phase sequence was used to document the  sheath position. MEDICATIONS: HEPARIN: 0 Units total. CONTRAST:  cc, Omnipaque 300 FLUOROSCOPY TIME:  FLUOROSCOPY TIME: See IR records TECHNIQUE: CATHETERS AND WIRES 5-French JB-1 catheter 0.035" glidewire VESSELS CATHETERIZED Right internal carotid Right external carotid Left internal carotid Left external carotid Left vertebral Right vertebral Right common femoral VESSELS STUDIED Right internal carotid, head  Right external carotid head Left internal carotid, head Left external carotid, head Left vertebral Right vertebral Right femoral PROCEDURAL NARRATIVE A 5-Fr JB-1 catheter was advanced over a 0.035 glidewire into the aortic arch. The above vessels were then sequentially catheterized and cervical / cerebral angiograms taken. After review of images, the catheter was removed without incident. FINDINGS: Right internal carotid, head: Injection reveals the presence of a widely patent ICA, M1, and A1 segments and their branches. The distal supraclinoid segment the right internal carotid artery is significantly tapered suggesting significant vasospasm with the ICA terminus measuring 1.3 mm in maximal dimension. There is also significant narrowing of the proximal portions of the right M1. The anterior cerebral artery on the right and its distal territory is not visualized on this right carotid injection. There is significant washout of the right middle cerebral artery territory due to sluggish flow and likely collateral flow from the contralateral internal carotid. No aneurysms are identified however the right posterior communicating artery is not well visualized due to spasm. The venous sinuses are widely patent. Right external carotid, head: The visualized cranial branches of the right external carotid artery are unremarkable. There is no early intracranial venous drainage identified. Left internal carotid, head: Injection reveals the presence of a widely patent ICA, A1, and M1 segments and their branches. No  aneurysms, arteriovenous malformations, or high-flow fistulas are identified. There is good flow through the anterior communicating artery with visualization of the contralateral A1 and contralateral middle cerebral artery territory. The parenchymal and venous phases are normal. The venous sinuses are widely patent. Left external carotid, head: The visualized cranial branches of the left external carotid artery are unremarkable. Early venous drainage is seen Right vertebral: Injection reveals the presence of a widely patent vertebral artery. No luminal regularities or pseudoaneurysm/dissection is seen in the cervical segments of the right vertebral artery. This leads to a widely patent basilar artery that terminates in right P1. The left P1 is hypoplastic and not visualized. The basilar apex is normal. No aneurysms AVMs, or fistulas are seen. The parenchymal and venous phases are normal. The venous sinuses are widely patent. Left vertebral: The vertebral artery is widely patent. No cervical dissections or pseudoaneurysms are seen. No PICA aneurysm is seen. See basilar description above. Right femoral: Normal vessel. No significant atherosclerotic disease. Arterial sheath in adequate position. DISPOSITION: Upon completion of the study, the femoral sheath was removed and hemostasis obtained using a 5-Fr ExoSeal closure device. Good proximal and distal lower extremity pulses were documented upon achievement of hemostasis. The procedure was well tolerated and no early complications were observed. The patient was transferred to the intensive care unit to lay flat for 2 hours. IMPRESSION: 1. Significant vasospasm involving the distal supraclinoid right internal carotid artery and proximal right M1. This is the cause of significant flow limitation with good collateral flow from the contralateral carotid through the anterior communicating artery. 2. No aneurysms, AVMs, or high-flow fistulas are identified, although the  right posterior communicating artery region is poorly visualized due to the above described vasospasm. The preliminary results of this procedure were shared with the patient and the patient's family. Electronically Signed   By: Consuella Lose   On: 01/15/2020 14:51   IR ANGIO EXTERNAL CAROTID SEL EXT CAROTID BILAT MOD SED  Result Date: 01/15/2020 PROCEDURE: DIAGNOSTIC CEREBRAL ANGIOGRAM HISTORY: The patient is a 69 year old man presenting to the hospital with sudden onset of severe neck pain headache without other focal neurologic deficit. CT scan demonstrated diffuse  basal subarachnoid hemorrhage while CT angiogram was negative. He therefore presents today for further workup with diagnostic cerebral angiogram. ACCESS: The technical aspects of the procedure as well as its potential risks and benefits were reviewed with the patient. These risks included but were not limited bleeding, infection, allergic reaction, damage to organs or vital structures, stroke, non-diagnostic procedure, and the catastrophic outcomes of heart attack, coma, and death. With an understanding of these risks, informed consent was obtained and witnessed. The patient was placed in the supine position on the angiography table and the skin of right groin prepped in the usual sterile fashion. The procedure was performed under local anesthesia (1%-solution of bicarbonate-buffered Lidocaine) and conscious sedation administered by the anesthesia service. A 5- French sheath was introduced in the right common femoral artery using Seldinger technique. A fluoro-phase sequence was used to document the sheath position. MEDICATIONS: HEPARIN: 0 Units total. CONTRAST:  cc, Omnipaque 300 FLUOROSCOPY TIME:  FLUOROSCOPY TIME: See IR records TECHNIQUE: CATHETERS AND WIRES 5-French JB-1 catheter 0.035" glidewire VESSELS CATHETERIZED Right internal carotid Right external carotid Left internal carotid Left external carotid Left vertebral Right vertebral  Right common femoral VESSELS STUDIED Right internal carotid, head Right external carotid head Left internal carotid, head Left external carotid, head Left vertebral Right vertebral Right femoral PROCEDURAL NARRATIVE A 5-Fr JB-1 catheter was advanced over a 0.035 glidewire into the aortic arch. The above vessels were then sequentially catheterized and cervical / cerebral angiograms taken. After review of images, the catheter was removed without incident. FINDINGS: Right internal carotid, head: Injection reveals the presence of a widely patent ICA, M1, and A1 segments and their branches. The distal supraclinoid segment the right internal carotid artery is significantly tapered suggesting significant vasospasm with the ICA terminus measuring 1.3 mm in maximal dimension. There is also significant narrowing of the proximal portions of the right M1. The anterior cerebral artery on the right and its distal territory is not visualized on this right carotid injection. There is significant washout of the right middle cerebral artery territory due to sluggish flow and likely collateral flow from the contralateral internal carotid. No aneurysms are identified however the right posterior communicating artery is not well visualized due to spasm. The venous sinuses are widely patent. Right external carotid, head: The visualized cranial branches of the right external carotid artery are unremarkable. There is no early intracranial venous drainage identified. Left internal carotid, head: Injection reveals the presence of a widely patent ICA, A1, and M1 segments and their branches. No aneurysms, arteriovenous malformations, or high-flow fistulas are identified. There is good flow through the anterior communicating artery with visualization of the contralateral A1 and contralateral middle cerebral artery territory. The parenchymal and venous phases are normal. The venous sinuses are widely patent. Left external carotid, head: The  visualized cranial branches of the left external carotid artery are unremarkable. Early venous drainage is seen Right vertebral: Injection reveals the presence of a widely patent vertebral artery. No luminal regularities or pseudoaneurysm/dissection is seen in the cervical segments of the right vertebral artery. This leads to a widely patent basilar artery that terminates in right P1. The left P1 is hypoplastic and not visualized. The basilar apex is normal. No aneurysms AVMs, or fistulas are seen. The parenchymal and venous phases are normal. The venous sinuses are widely patent. Left vertebral: The vertebral artery is widely patent. No cervical dissections or pseudoaneurysms are seen. No PICA aneurysm is seen. See basilar description above. Right femoral: Normal vessel. No significant atherosclerotic  disease. Arterial sheath in adequate position. DISPOSITION: Upon completion of the study, the femoral sheath was removed and hemostasis obtained using a 5-Fr ExoSeal closure device. Good proximal and distal lower extremity pulses were documented upon achievement of hemostasis. The procedure was well tolerated and no early complications were observed. The patient was transferred to the intensive care unit to lay flat for 2 hours. IMPRESSION: 1. Significant vasospasm involving the distal supraclinoid right internal carotid artery and proximal right M1. This is the cause of significant flow limitation with good collateral flow from the contralateral carotid through the anterior communicating artery. 2. No aneurysms, AVMs, or high-flow fistulas are identified, although the right posterior communicating artery region is poorly visualized due to the above described vasospasm. The preliminary results of this procedure were shared with the patient and the patient's family. Electronically Signed   By: Consuella Lose   On: 01/15/2020 14:51    Antibiotics:  Anti-infectives (From admission, onward)   Start     Dose/Rate  Route Frequency Ordered Stop   01/15/20 1200  vancomycin (VANCOREADY) IVPB 2000 mg/400 mL  Status:  Discontinued     2,000 mg 200 mL/hr over 120 Minutes Intravenous Every 24 hours 01/14/20 1125 01/14/20 1351   01/14/20 2200  ceFEPIme (MAXIPIME) 2 g in sodium chloride 0.9 % 100 mL IVPB  Status:  Discontinued     2 g 200 mL/hr over 30 Minutes Intravenous Every 8 hours 01/14/20 1122 01/14/20 1840   01/14/20 1500  metroNIDAZOLE (FLAGYL) IVPB 500 mg  Status:  Discontinued     500 mg 100 mL/hr over 60 Minutes Intravenous  Once 01/14/20 1352 01/14/20 1840   01/14/20 1430  vancomycin (VANCOREADY) IVPB 2000 mg/400 mL  Status:  Discontinued     2,000 mg 200 mL/hr over 120 Minutes Intravenous Every 24 hours 01/14/20 1351 01/14/20 1840   01/14/20 1130  vancomycin (VANCOCIN) IVPB 1000 mg/200 mL premix  Status:  Discontinued     1,000 mg 200 mL/hr over 60 Minutes Intravenous  Once 01/14/20 1125 01/14/20 1351   01/14/20 1112  sodium chloride 0.9 % with ceFEPIme (MAXIPIME) ADS Med  Status:  Discontinued    Note to Pharmacy: Marinus Maw   : cabinet override      01/14/20 1112 01/14/20 1950   01/14/20 1100  ceFEPIme (MAXIPIME) 2 g in sodium chloride 0.9 % 100 mL IVPB     2 g 200 mL/hr over 30 Minutes Intravenous  Once 01/14/20 1050 01/14/20 1439   01/14/20 1100  metroNIDAZOLE (FLAGYL) IVPB 500 mg  Status:  Discontinued     500 mg 100 mL/hr over 60 Minutes Intravenous  Once 01/14/20 1050 01/14/20 1352   01/14/20 1100  vancomycin (VANCOCIN) IVPB 1000 mg/200 mL premix  Status:  Discontinued     1,000 mg 200 mL/hr over 60 Minutes Intravenous  Once 01/14/20 1050 01/14/20 1351      Discharge Exam: Blood pressure (!) 188/78, pulse 92, temperature 98.8 F (37.1 C), resp. rate (!) 21, height 6' (1.829 m), weight 92.2 kg, SpO2 96 %.  He is arousable and follows commands and has some extremity weakness that is improving with time  Discharge Medications:   Allergies as of 02/02/2020   No Known Allergies      Medication List    TAKE these medications   Aspirin Adult Low Strength 81 MG EC tablet Generic drug: aspirin Take 81 mg by mouth daily. Swallow whole.   atorvastatin 80 MG tablet Commonly known as: LIPITOR Take  1 tablet (80 mg total) by mouth daily.   cholecalciferol 25 MCG (1000 UNIT) tablet Commonly known as: VITAMIN D3 Take 1,000 Units by mouth daily.   Crestor 40 MG tablet Generic drug: rosuvastatin Take 1 tablet (40 mg total) by mouth daily.   fenofibrate 160 MG tablet Take 1 tablet (160 mg total) by mouth daily.   glipiZIDE 10 MG 24 hr tablet Commonly known as: GLUCOTROL XL Take 1 tablet (10 mg total) by mouth daily with breakfast.   insulin aspart 100 UNIT/ML FlexPen Commonly known as: NOVOLOG Inject 75 Units into the skin daily.   insulin degludec 200 UNIT/ML FlexTouch Pen Commonly known as: TRESIBA Inject 35 Units into the skin daily.   insulin detemir 100 UNIT/ML FlexPen Commonly known as: LEVEMIR Inject 20 Units into the skin See admin instructions. Sliding scale : Takes 15 units if blood sugar more than 300. Max 20 units.   lisinopril 20 MG tablet Commonly known as: ZESTRIL Take 1 tablet (20 mg total) by mouth daily.   omega-3 acid ethyl esters 1 g capsule Commonly known as: Lovaza Take 2 capsules (2 g total) by mouth 2 (two) times daily.   pioglitazone 45 MG tablet Commonly known as: ACTOS Take 1 tablet (45 mg total) by mouth daily.   sitaGLIPtin-metformin 50-1000 MG tablet Commonly known as: JANUMET Take 1 tablet by mouth 2 (two) times daily with a meal.   Xigduo XR 06-999 MG Tb24 Generic drug: Dapagliflozin-metFORMIN HCl ER Take 1 tablet by mouth daily.       Disposition: CIR   Final Dx: Subarachnoid hemorrhage, vasospasm, stroke  Discharge Instructions    Diet - low sodium heart healthy   Complete by: As directed    Increase activity slowly   Complete by: As directed          Signed: Eustace Moore 02/02/2020, 7:36  AM

## 2020-02-02 NOTE — Progress Notes (Signed)
[facility] short progress note:  Noted that patient is to be discharged to CIR today. Oral intake still poor.  Recommend that current treatment for DM with low dose SSI and 30 U of Levemir BID be continued.  Calvert Cantor, MD

## 2020-02-02 NOTE — Progress Notes (Signed)
Marcello FennelPatel, Ankit Anil, MD  Physician  Physical Medicine and Rehabilitation  Consult Note      Signed  Date of Service:  01/25/2020  7:30 AM      Related encounter: ED to Hosp[facility]Ad[facility] (Dis[facility]      Signed      Expand AllCollapse All   Show:Clear all [x] Manual[x] Template[x] Copied  Added by: [x] Angiulli, Mcarthur Rossettianiel J, PA-C[x] Allena KatzPatel, Maryln GottronAnkit Anil, MD  [] Hover for details          Physical Medicine and Rehabilitation Consult Reason for Consult: Decreased functional ability with headache and altered mental status Referring Physician: Dr. Conchita ParisNundkumar   HPI: Nicholas [name] is a 69 y.o. right-handed male with history of hypertension, diabetes mellitus, hepatitis, BPH.  History taken from chart review due to mentation.  Patient lives with wife independent prior to admission.  He presented on 01/14/2020 with nausea and headaches.  CT angiogram head and neck showed large volume acute subarachnoid hemorrhage.  Large amount of subarachnoid blood seen in the basilar cisterns and extending into the sylvian fissure bilaterally right greater than left.  Interhemispheric subarachnoid hemorrhage present.  Negative for hydrocephalus.  Patient underwent cerebral angiogram balloon angioplasty of right internal carotid artery right middle cerebral artery and balloon angioplasty of basilar artery.  Keppra started for seizure prophylaxis.  Echocardiogram with ejection fraction of 70% with grade 1 diastolic dysfunction.  Maintained on Nimotop for blood pressure control and vasospasms.  Cardiology was consulted on 01/23/2020 for wide-complex tachycardia.  Latest follow-up MRI of the brain personally reviewed, showing decreased left SDH.  Per report, mildly increased left subdural hematoma since 01/19/2020 now up to 9 mm in thickness.  Small right-sided subdural hematoma regressed.  Associated mildly increased rightward midline shift 7 mm.  Initially on subcutaneous heparin for  DVT prophylaxis discontinued due to thrombocytopenia currently on Arixtra.  Palliative care has been consulted to establish goals of care.  He is tolerating a regular consistency diet.  Hospital course further complicated by tachypnea, labile blood glucose, hypertension, hypokalemia.  Therapy evaluations completed with recommendations of physical medicine rehab consult.   Review of Systems  Unable to perform ROS: Mental acuity        Past Medical History:  Diagnosis Date  . BPH (benign prostatic hypertrophy) 12/20/2011    Dr. Cleatrice Burkeoughlin  . Colon polyp      Dr. Vernell Barrierraelos, it was recommended that he have a follow-up colonoscopy in 2010.  Marland Kitchen. Deviated septum    . Diabetes mellitus without complication [facility](HCC)      Eye exam - 2011  . Hepatitis A    . HSV-1 (herpes simplex virus 1) infection    . Hyperlipidemia    . Hypertension    . Migraine headache           Past Surgical History:  Procedure Laterality Date  . IR ANGIO EXTERNAL CAROTID SEL EXT CAROTID BILAT MOD SED   01/15/2020  . IR ANGIO INTRA EXTRACRAN SEL INTERNAL CAROTID BILAT MOD SED   01/15/2020  . IR ANGIO VERTEBRAL SEL VERTEBRAL BILAT MOD SED   01/15/2020  . IR ENDOVASC INTRACRANIAL INF OTHER THAN THROMBO ART INC DIAG ANGIO   01/18/2020  . IR ENDOVASC INTRACRANIAL INF OTHER THAN THROMBO ART INC DIAG ANGIO EA ADD   01/18/2020  . IR PTA VASOSPASM ADD DIFF   01/18/2020  . IR PTA VASOSPASM INITIAL   01/18/2020  . NASAL SEPTUM SURGERY      . RADIOLOGY WITH ANESTHESIA  N/A 01/15/2020    Procedure: IR WITH ANESTHESIA;  Surgeon: Lisbeth Renshaw, MD;  Location: [facility] OR;  Service: Radiology;  Laterality: N/A;  . RADIOLOGY WITH ANESTHESIA N/A 01/18/2020    Procedure: IR WITH ANESTHESIA;  Surgeon: Lisbeth Renshaw, MD;  Location: [facility] OR;  Service: Radiology;  Laterality: N/A;         Family History  Problem Relation Age of Onset  . Diabetes Mother    . Neuropathy Mother    . Diabetes Father    . Cancer Father          Lung Cancer  . Diabetes  Sister    . Diabetes Brother      Social History:  reports that he has never smoked. He has never used smokeless tobacco. He reports current alcohol use. He reports that he does not use drugs. Allergies: No Known Allergies       Medications Prior to Admission  Medication Sig Dispense Refill  . aspirin (ASPIRIN ADULT LOW STRENGTH) 81 MG EC tablet Take 81 mg by mouth daily. Swallow whole.      Marland Kitchen atorvastatin (LIPITOR) 80 MG tablet Take 1 tablet (80 mg total) by mouth daily. 90 tablet 1  . cholecalciferol (VITAMIN D3) 25 MCG (1000 UNIT) tablet Take 1,000 Units by mouth daily.      . CRESTOR 40 MG tablet Take 1 tablet (40 mg total) by mouth daily. 90 tablet 1  . fenofibrate 160 MG tablet Take 1 tablet (160 mg total) by mouth daily. 90 tablet 3  . insulin aspart (NOVOLOG) 100 UNIT/ML FlexPen Inject 75 Units into the skin daily.       . insulin degludec (TRESIBA) 200 UNIT/ML FlexTouch Pen Inject 35 Units into the skin daily.       . insulin detemir (LEVEMIR) 100 UNIT/ML FlexPen Inject 20 Units into the skin See admin instructions. Sliding scale : Takes 15 units if blood sugar more than 300. Max 20 units.      Marland Kitchen lisinopril (PRINIVIL,ZESTRIL) 20 MG tablet Take 1 tablet (20 mg total) by mouth daily. 90 tablet 3  . XIGDUO XR 06-999 MG TB24 Take 1 tablet by mouth daily.      Marland Kitchen glipiZIDE (GLUCOTROL XL) 10 MG 24 hr tablet Take 1 tablet (10 mg total) by mouth daily with breakfast. (Patient not taking: Reported on 01/14/2020) 90 tablet 1  . omega-3 acid ethyl esters (LOVAZA) 1 G capsule Take 2 capsules (2 g total) by mouth 2 (two) times daily. (Patient not taking: Reported on 01/14/2020) 360 capsule 1  . pioglitazone (ACTOS) 45 MG tablet Take 1 tablet (45 mg total) by mouth daily. (Patient not taking: Reported on 01/14/2020) 90 tablet 1  . sitaGLIPtin-metformin (JANUMET) 50-1000 MG per tablet Take 1 tablet by mouth 2 (two) times daily with a meal. (Patient not taking: Reported on 01/14/2020) 180 tablet 1        Home: Home Living Family/patient expects to be discharged to:: Private residence Living Arrangements: Spouse/significant other Available Help at Discharge: Family, Available 24 hours/day Type of Home: House Home Access: Stairs to enter Secretary/administrator of Steps: 3 Entrance Stairs-Rails: Right Home Layout: One level Bathroom Shower/Tub: Engineer, manufacturing systems: Standard Home Equipment: Environmental consultant - 2 wheels, Crutches, Bedside commode  Functional History: Prior Function Level of Independence: Independent Comments: working as Dealer Status:  Mobility: Bed Mobility Overal bed mobility: Needs Assistance Bed Mobility: Supine to Sit, Sit to Supine Supine to sit: Mod assist, HOB elevated, +2 for physical  assistance Sit to supine: Max assist, +2 for physical assistance General bed mobility comments: Mod two person assist to come to sitting on R side of the bed, pt using right rail with right hand heavily to pull up to sitting, assist needed to right trunk and to help scoot to EOB.  Assist also needed at left leg to progress over side of bed.  More assist needed to return to supine to flat bed assiting at trunk and to lift both legs. Pt reported back pain with transition back to supine, but not up to sitting.  Transfers Overall transfer level: Needs assistance Equipment used: 2 person hand held assist Transfer via Lift Equipment: Stedy Transfers: Sit to/from Stand Sit to Stand: Max assist, +2 physical assistance General transfer comment: Two person max assist to stand up to stedy standing frame due to left lateral pushing.  Attempted to use bed rail to have him shift more to the right than using stedy standing frame bar, but unsuccessful.  Stood x 3 Ambulation/Gait Ambulation/Gait assistance: Land (Feet): 400 Feet Assistive device: None Gait Pattern/deviations: Step-through pattern General Gait Details: unable at this time  Gait velocity:  functional Gait velocity interpretation: 1.31 - 2.62 ft/sec, indicative of limited community ambulator   ADL: ADL Overall ADL's : Needs assistance/impaired Eating/Feeding: NPO, Sitting Grooming: Wash/dry hands, Wash/dry face, Moderate assistance, Sitting, Brushing hair Grooming Details (indicate cue type and reason): assist for sitting balance, and to attend to Lt side of head and face  Upper Body Bathing: Maximal assistance, Sitting Lower Body Bathing: Total assistance, Bed level Upper Body Dressing : Total assistance, Sitting Lower Body Dressing: Total assistance, Bed level Lower Body Dressing Details (indicate cue type and reason): pt able to don socks seated EOB, minA standing balance Toilet Transfer: Total assistance Toilet Transfer Details (indicate cue type and reason): unable to attempt  Toileting- Clothing Manipulation and Hygiene: Total assistance, Bed level Functional mobility during ADLs: Maximal assistance, +2 for physical assistance(bed mobility ) General ADL Comments: Pt requires assist for sitting balance, trunk control, cues/assist for neglect    Cognition: Cognition Overall Cognitive Status: Impaired/Different from baseline Arousal/Alertness: Awake/alert Orientation Level: Oriented to person, Oriented to place, Disoriented to time, Disoriented to situation Attention: Selective Selective Attention: Impaired Selective Attention Impairment: Verbal basic Memory: Impaired Memory Impairment: Storage deficit, Retrieval deficit, Decreased recall of new information Awareness: Impaired Awareness Impairment: Intellectual impairment, Emergent impairment Problem Solving: Impaired Problem Solving Impairment: Verbal complex Executive Function: Self Monitoring Self Monitoring: Impaired Self Monitoring Impairment: Functional basic Safety/Judgment: Impaired Comments: slow processing Cognition Arousal/Alertness: Awake/alert Behavior During Therapy: Flat affect Overall  Cognitive Status: Impaired/Different from baseline Area of Impairment: Attention, Following commands, Memory, Safety/judgement, Awareness Current Attention Level: Focused, Sustained Memory: Decreased short-term memory Following Commands: Follows one step commands inconsistently Safety/Judgement: Decreased awareness of safety, Decreased awareness of deficits Awareness: Intellectual Problem Solving: Slow processing, Decreased initiation, Difficulty sequencing, Requires verbal cues General Comments: Pt continuing to report feeling "heavy" in standing vs weak on one side of his body.  Also continues to be unawre of his left lean.     Blood pressure (!) 201/85, pulse 75, temperature 98.3 F (36.8 C), temperature source Oral, resp. rate (!) 22, height 6' (1.829 m), weight 96.5 kg, SpO2 96 %. Physical Exam  Vitals reviewed. Constitutional: He appears well-developed and well-nourished.  HENT:  Head: Normocephalic and atraumatic.  Eyes: Right eye exhibits no discharge. Left eye exhibits no discharge.  Right eye closed  Neck: No tracheal deviation  present. No thyromegaly present.  Respiratory: Effort normal. No stridor. No respiratory distress.  GI: He exhibits distension.  Musculoskeletal:     Comments: LUE edema  Neurological: He is alert.  Alert Right gaze preference Left facial droop Right lean Motor: Limited due to ability to follow commands and attention Right sided minimal movement noted upper and lower extremities LUE/LLE: 0/5 Right ptosis  Skin: Skin is warm and dry.  Psychiatric:  Unable to assess due to mentation      Lab Results Last 24 Hours       Results for orders placed or performed during the hospital encounter of 01/14/20 (from the past 24 hour(s))  Glucose, capillary     Status: Abnormal    Collection Time: 01/24/20 12:33 PM  Result Value Ref Range    Glucose-Capillary 231 (H) 70 - 99 mg/dL  Glucose, capillary     Status: Abnormal    Collection Time: 01/24/20   3:59 PM  Result Value Ref Range    Glucose-Capillary 244 (H) 70 - 99 mg/dL  Basic metabolic panel     Status: Abnormal    Collection Time: 01/24/20  5:32 PM  Result Value Ref Range    Sodium 134 (L) 135 - 145 mmol/L    Potassium 2.9 (L) 3.5 - 5.1 mmol/L    Chloride 98 98 - 111 mmol/L    CO2 28 22 - 32 mmol/L    Glucose, Bld 231 (H) 70 - 99 mg/dL    BUN 15 8 - 23 mg/dL    Creatinine, Ser 0.78 0.61 - 1.24 mg/dL    Calcium 8.1 (L) 8.9 - 10.3 mg/dL    GFR calc non Af Amer >60 >60 mL/min    GFR calc Af Amer >60 >60 mL/min    Anion gap 8 5 - 15  Magnesium     Status: None    Collection Time: 01/24/20  5:32 PM  Result Value Ref Range    Magnesium 1.9 1.7 - 2.4 mg/dL  Glucose, capillary     Status: Abnormal    Collection Time: 01/24/20  6:57 PM  Result Value Ref Range    Glucose-Capillary 206 (H) 70 - 99 mg/dL  Glucose, capillary     Status: Abnormal    Collection Time: 01/24/20 11:14 PM  Result Value Ref Range    Glucose-Capillary 209 (H) 70 - 99 mg/dL  Glucose, capillary     Status: Abnormal    Collection Time: 01/25/20  3:12 AM  Result Value Ref Range    Glucose-Capillary 158 (H) 70 - 99 mg/dL  CBC with Differential/Platelet     Status: Abnormal    Collection Time: 01/25/20  5:44 AM  Result Value Ref Range    WBC 26.2 (H) 4.0 - 10.5 K/uL    RBC 4.18 (L) 4.22 - 5.81 MIL/uL    Hemoglobin 12.0 (L) 13.0 - 17.0 g/dL    HCT 36.0 (L) 39.0 - 52.0 %    MCV 86.1 80.0 - 100.0 fL    MCH 28.7 26.0 - 34.0 pg    MCHC 33.3 30.0 - 36.0 g/dL    RDW 13.7 11.5 - 15.5 %    Platelets 128 (L) 150 - 400 K/uL    nRBC 0.0 0.0 - 0.2 %    Neutrophils Relative % 89 %    Neutro Abs 23.5 (H) 1.7 - 7.7 K/uL    Lymphocytes Relative 4 %    Lymphs Abs 1.0 0.7 - 4.0 K/uL    Monocytes  Relative 6 %    Monocytes Absolute 1.5 (H) 0.1 - 1.0 K/uL    Eosinophils Relative 0 %    Eosinophils Absolute 0.0 0.0 - 0.5 K/uL    Basophils Relative 0 %    Basophils Absolute 0.0 0.0 - 0.1 K/uL    Immature Granulocytes  1 %    Abs Immature Granulocytes 0.25 (H) 0.00 - 0.07 K/uL  Magnesium     Status: None    Collection Time: 01/25/20  5:44 AM  Result Value Ref Range    Magnesium 1.7 1.7 - 2.4 mg/dL  Basic metabolic panel     Status: Abnormal    Collection Time: 01/25/20  5:44 AM  Result Value Ref Range    Sodium 135 135 - 145 mmol/L    Potassium 3.2 (L) 3.5 - 5.1 mmol/L    Chloride 97 (L) 98 - 111 mmol/L    CO2 25 22 - 32 mmol/L    Glucose, Bld 130 (H) 70 - 99 mg/dL    BUN 13 8 - 23 mg/dL    Creatinine, Ser 2.84 0.61 - 1.24 mg/dL    Calcium 8.4 (L) 8.9 - 10.3 mg/dL    GFR calc non Af Amer >60 >60 mL/min    GFR calc Af Amer >60 >60 mL/min    Anion gap 13 5 - 15  Phosphorus     Status: None    Collection Time: 01/25/20  5:44 AM  Result Value Ref Range    Phosphorus 2.7 2.5 - 4.6 mg/dL  Glucose, capillary     Status: Abnormal    Collection Time: 01/25/20  7:30 AM  Result Value Ref Range    Glucose-Capillary 157 (H) 70 - 99 mg/dL  Glucose, capillary     Status: Abnormal    Collection Time: 01/25/20 10:58 AM  Result Value Ref Range    Glucose-Capillary 249 (H) 70 - 99 mg/dL      Imaging Results (Last 48 hours)  No results found.     Assessment/Plan: Diagnosis: Left > Right subdural hematoma  Stroke: Continue secondary stroke prophylaxis and Risk Factor Modification listed below:   Blood Pressure Management:  Continue current medication with prn's with permisive HTN per primary team Statin Agent:   Diabetes management:   L>R sided hemiparesis: fit for orthosis to prevent contractures (resting hand splint for day, wrist cock up splint at night, PRAFO, etc) PT/OT for mobility, ADL training  Motor recovery: Fluoxetine Labs and images (see above) independently reviewed.  Records reviewed and summated above.   1. Does the need for close, 24 hr/day medical supervision in concert with the patient's rehab needs make it unreasonable for this patient to be served in a less intensive setting?  Yes 2. Co-Morbidities requiring supervision/potential complications: HTN (monitor and provide prns in accordance with increased physical exertion and pain), DM with hyperglycemia (Monitor in accordance with exercise and adjust meds as necessary), hepatitis (avoid hepatotoxic meds), BPH (PVRs to ensure no retention), abdominal distention (increase bowel meds, continue monitor hepatic function) 3. Due to bladder management, bowel management, safety, skin/wound care, disease management, medication administration, pain management and patient education, does the patient require 24 hr/day rehab nursing? Yes 4. Does the patient require coordinated care of a physician, rehab nurse, therapy disciplines of PT/OT/SLP to address physical and functional deficits in the context of the above medical diagnosis(es)? Yes Addressing deficits in the following areas: balance, endurance, locomotion, strength, transferring, bowel/bladder control, bathing, dressing, feeding, grooming, toileting, cognition, speech, language and psychosocial  support 5. Can the patient actively participate in an intensive therapy program of at least 3 hrs of therapy per day at least 5 days per week? Yes and Potentially 6. The potential for patient to make measurable gains while on inpatient rehab is excellent 7. Anticipated functional outcomes upon discharge from inpatient rehab are min assist  with PT, min assist with OT, supervision and min assist with SLP. 8. Estimated rehab length of stay to reach the above functional goals is: 27-30 days. 9. Anticipated discharge destination: Home 10. Overall Rehab/Functional Prognosis: good   RECOMMENDATIONS: This patient's condition is appropriate for continued rehabilitative care in the following setting: CIR when medically stable and able to tolerate 3 hours of therapy per day. Patient has agreed to participate in recommended program. Potentially Note that insurance prior authorization may be required  for reimbursement for recommended care.   Comment: Rehab Admissions Coordinator to follow up.   I have personally performed a face to face diagnostic evaluation, including, but not limited to relevant history and physical exam findings, of this patient and developed relevant assessment and plan.  Additionally, I have reviewed and concur with the physician assistant's documentation above.    Maryla Morrow, MD, ABPMR Mcarthur Rossetti Angiulli, PA-C 01/25/2020        Revision History                     Routing History

## 2020-02-02 NOTE — Progress Notes (Signed)
Patient admitted to the unit at 1535 with family members at bedside. Assessment done, patient and family educated done, safety protocol in place. Patient A&O to self. Bed in low position and call bell within reach. We continue to monitor.

## 2020-02-02 NOTE — Progress Notes (Signed)
Izora Ribas, MD  Physician  Physical Medicine and Rehabilitation  PMR Pre-admission      Signed  Date of Service:  02/01/2020 11:09 AM      Related encounter: ED to Hosp-Admission (Discharged) from 01/14/2020 in [place] [facility]UR[facility]AU[facility]/SURGICAL ICU      Signed        Show:Clear all _0 Manual_1 Template_2 Copied  Added by: _3 Raechel Ache, OT  _4 Hover for details PMR Admission Coordinator Pre-Admission Assessment   Patient: Nicholas [name] is an 69 y.o., male MRN: [id number] DOB: Jul 20, 1951 Height: 6' (182.9 cm) Weight: 92.2 kg                                                                                                                                                  Insurance Information HMO:     PPO: yes     PCP:      IPA:      80/20:      OTHER:  PRIMARY: [employer] with [employer] via Bernadene Bell      Policy#: [id number]      Subscriber: patient CM Name: faxed approval from clinical services      Phone#: [phone] (option 2)     Fax#: [phone] Pre-Cert#: ([id number]      Employer:  [employer] provided by Clinical services Fax with approval for CIR. Inpatient rehab date of service: 02/01/2020 - 02/07/2120 (7 days) with LCD: 02/07/2020 (p): [phone] (option 2); (f): [phone] Benefits:  Phone #: [employer] [phone]     Name:  Eff. Date: 09/20/2018-11/18/2019     Deduct: $1,350 ($1,350)      Out of Pocket Max: $2,700 ($2,700 met)      Life Max: NA  CIR: 90% coverage, 10% co-insurance (after deductible, 100% coverage)      SNF: 90% coverage, 10% co-insurance (after deductible, 100% coverage); 120 day limit Outpatient: 90% coverage, 10% co-insurance (after deductible, 100% coverage); limited to 60 visits     Home Health: 90% coverage, 10% co-insurance (after deductible, 100% coverage); limited to 100 visits      DME: 90% coverage, 10% co-insurance (after deductible, 100% coverage) Providers:  SECONDARY: Medicare Part A       Policy#: [id number]      Phone#: verified eligibility online via [employer] on 02/01/20   Financial Counselor:       Phone#:    The "Data Collection Information Summary" for patients in Inpatient Rehabilitation Facilities with attached "Privacy Act [place] Records" was provided and verbally reviewed with: Patient and Family   Emergency Contact Information         Contact Information     Name Relation Home Work Mobile    Ebro Spouse [phone]   [phone]       Current Medical History  Patient Admitting Diagnosis: Left > Right subdural hematoma    History of Present Illness: Nicholas [name]  Nicholas [name] is a 69 year old male history of hypertension, diabetes mellitus, hepatitis, BPH.  Per chart review patient lives with wife independent prior to admission.  Presented 01/14/2020 with nausea and headache and left-sided weakness.  CT angiogram of head and neck showed a large volume acute subarachnoid hemorrhage.  Large amount of subarachnoid blood seen in the basilar cisterns and extending into the sylvian fissure bilaterally right greater than left.  Interhemispheric subarachnoid hemorrhage present.  Negative for hydrocephalus.  Patient underwent cerebral angiogram balloon angioplasty of right internal carotid artery right middle cerebral artery and balloon angioplasty of basilar artery.  Keppra initiated for seizure prophylaxis.  Echocardiogram with ejection fraction of 70% with grade 1 diastolic dysfunction.  Maintained on Nimotop  for blood pressure control and vasospasms times protocol 21 days.  Cardiology services consulted 01/23/2020 for wide-complex tachycardia/hypotension and placed on amiodarone 200 mg daily as well is cleared for aspirin therapy...  Latest follow-up MRI of the brain showed mildly increased left subdural hematoma since 01/19/2020 9 mm in thickness.  Smaller right side subdural hematoma has regressed.  Associated mildly increased rightward midline shift was 7 mm.  Presently patient has been cleared to  initiate Lovenox for DVT prophylaxis..  Palliative care consulted for goals of care.  Tolerating a regular diet. Therapy evaluations completed and patient is to be admitted for a comprehensive rehab program on 02/02/20.    Complete NIHSS TOTAL: 10 Glasgow Coma Scale Score: 14   Past Medical History      Past Medical History:  Diagnosis Date  . BPH (benign prostatic hypertrophy) 12/20/2011    Dr. Thomasene Mohair  . Colon polyp      Dr. Valli Glance, it was recommended that he have a follow-up colonoscopy in 2010.  Marland Kitchen Deviated septum    . Diabetes mellitus without complication [facility])      Eye exam - 2011  . Hepatitis A    . HSV-1 (herpes simplex virus 1) infection    . Hyperlipidemia    . Hypertension    . Migraine headache        Family History  family history includes Cancer in his father; Diabetes in his brother, father, mother, and sister; Neuropathy in his mother.   Prior Rehab/Hospitalizations:  Has the patient had prior rehab or hospitalizations prior to admission? No   Has the patient had major surgery during 100 days prior to admission? Yes   Current Medications    Current Facility-Administered Medications:  .  0.9 %  sodium chloride infusion, , Intravenous, PRN, Maudie Flakes, MD, Last Rate: 0 mL/hr at 01/14/20 1126, New Bag at 01/18/20 1157 .  0.9 % NaCl with KCl 20 mEq/ L  infusion, , Intravenous, Continuous, Rizwan, Saima, MD, Last Rate: 75 mL/hr at 02/01/20 1200, Rate Verify at 02/01/20 1200 .  acetaminophen (TYLENOL) tablet 650 mg, 650 mg, Oral, Q4H PRN, 650 mg at 01/27/20 0956 **OR** acetaminophen (TYLENOL) 160 MG/5ML solution 650 mg, 650 mg, Per Tube, Q4H PRN, 650 mg at 01/20/20 2035 **OR** acetaminophen (TYLENOL) suppository 650 mg, 650 mg, Rectal, Q4H PRN, Kary Kos, MD .  aspirin tablet 325 mg, 325 mg, Oral, Daily, Consuella Lose, MD, 325 mg at 01/31/20 1446 .  bisacodyl (DULCOLAX) EC tablet 10 mg, 10 mg, Oral, Q0600, Merlene Laughter F, NP, 10 mg at 01/29/20 1029 .   bisacodyl (DULCOLAX) suppository 10 mg, 10 mg, Rectal, Daily PRN, Johnsie Cancel, NP .  Chlorhexidine Gluconate Cloth 2 % PADS 6 each, 6 each, Topical, Daily, Saintclair Halsted,  Nicholas Severin, MD, 6 each at 02/01/20 1500 .  cholecalciferol (VITAMIN D3) tablet 1,000 Units, 1,000 Units, Oral, Daily, Kary Kos, MD, 1,000 Units at 01/30/20 0924 .  enoxaparin (LOVENOX) injection 40 mg, 40 mg, Subcutaneous, Q24H, Rigoberto Noel, MD, 40 mg at 02/01/20 1128 .  fenofibrate tablet 160 mg, 160 mg, Oral, Daily, Debbe Odea, MD .  fentaNYL (SUBLIMAZE) injection, , Intravenous, PRN, Consuella Lose, MD, 12.5 mcg at 01/31/20 1308 .  HYDROmorphone (DILAUDID) injection 0.5 mg, 0.5 mg, Intravenous, Q2H PRN, Agarwala, Ravi, MD, 0.5 mg at 01/31/20 2208 .  insulin aspart (novoLOG) injection 0-6 Units, 0-6 Units, Subcutaneous, TID WC, Debbe Odea, MD, 2 Units at 02/01/20 0817 .  insulin detemir (LEVEMIR) injection 30 Units, 30 Units, Subcutaneous, BID, Debbe Odea, MD, 30 Units at 02/01/20 1127 .  levETIRAcetam (KEPPRA) 100 MG/ML solution 500 mg, 500 mg, Oral, BID, Consuella Lose, MD, 500 mg at 02/01/20 1035 .  lidocaine (XYLOCAINE) 1 % (with pres) injection, , Infiltration, PRN, Consuella Lose, MD, 10 mL at 01/31/20 1309 .  magnesium hydroxide (MILK OF MAGNESIA) suspension 30 mL, 30 mL, Oral, Daily, Candee Furbish, MD, 30 mL at 01/30/20 0923 .  methocarbamol (ROBAXIN) tablet 500 mg, 500 mg, Oral, Q8H PRN, Rigoberto Noel, MD, 500 mg at 02/01/20 1133 .  niMODipine (NIMOTOP) capsule 60 mg, 60 mg, Oral, Q4H **OR** niMODipine (NYMALIZE) 6 MG/ML oral solution 60 mg, 60 mg, Oral, Q4H, Consuella Lose, MD, 60 mg at 02/01/20 0356 .  oxyCODONE-acetaminophen (PERCOCET/ROXICET) 5-325 MG per tablet 1 tablet, 1 tablet, Oral, Q4H PRN, Kipp Brood, MD, 1 tablet at 02/01/20 1417 .  rosuvastatin (CRESTOR) tablet 40 mg, 40 mg, Oral, Daily, Kary Kos, MD, 40 mg at 01/31/20 1027 .  senna-docusate (Senokot-S) tablet 1 tablet, 1 tablet,  Oral, BID, Kary Kos, MD, 1 tablet at 01/31/20 2128 .  simethicone (MYLICON) chewable tablet 160 mg, 160 mg, Oral, QID PRN, Acquanetta Chain, DO, 160 mg at 01/17/20 1002   Patients Current Diet:     Diet Order                      Diet regular Room service appropriate? Yes; Fluid consistency: Thin  Diet effective now                   Precautions / Restrictions Precautions Precautions: Fall Precaution Comments: Lt hemiparesis with Lt neglect  Restrictions Weight Bearing Restrictions: No Other Position/Activity Restrictions: keep BP >160    Has the patient had 2 or more falls or a fall with injury in the past year?No   Prior Activity Level Community (5-7x/wk): very active PTA, is a Editor, commissioning, works full time; drives, no AD use PTA   Prior Functional Level Prior Function Level of Independence: Independent Comments: working as Electrical engineer Care: Did the patient need help bathing, dressing, using the toilet or eating?  Independent   Indoor Mobility: Did the patient need assistance with walking from room to room (with or without device)? Independent   Stairs: Did the patient need assistance with internal or external stairs (with or without device)? Independent   Functional Cognition: Did the patient need help planning regular tasks such as shopping or remembering to take medications? Independent   Home Assistive Devices / Equipment Home Assistive Devices/Equipment: None Home Equipment: Walker - 2 wheels, Crutches, Bedside commode   Prior Device Use: Indicate devices/aids used by the patient prior to current illness, exacerbation or injury? None  of the above   Current Functional Level Cognition   Arousal/Alertness: Awake/alert Overall Cognitive Status: Impaired/Different from baseline Current Attention Level: Sustained Orientation Level: Oriented to person Following Commands: Follows one step commands inconsistently, Follows one step commands  with increased time Safety/Judgement: Decreased awareness of safety, Decreased awareness of deficits General Comments: speaking some this session and following some commands especially when lifted to chair, more despondent per RN and family Attention: Selective Selective Attention: Impaired Selective Attention Impairment: Verbal basic Memory: Impaired Memory Impairment: Storage deficit, Retrieval deficit, Decreased recall of new information Awareness: Impaired Awareness Impairment: Intellectual impairment, Emergent impairment Problem Solving: Impaired Problem Solving Impairment: Verbal complex Executive Function: Self Monitoring Self Monitoring: Impaired Self Monitoring Impairment: Functional basic Safety/Judgment: Impaired Comments: slow processing    Extremity Assessment (includes Sensation/Coordination)   Upper Extremity Assessment: LUE deficits/detail LUE Deficits / Details: AROM composite flexion/extenion of digits, supination/pronation, elbow flexion/extension, and shoulder forward flexion to ~90 degrees LUE Sensation: decreased light touch, decreased proprioception LUE Coordination: decreased fine motor, decreased gross motor  Lower Extremity Assessment: Defer to PT evaluation     ADLs   Overall ADL's : Needs assistance/impaired Eating/Feeding: Set up, Supervision/ safety, Sitting Eating/Feeding Details (indicate cue type and reason): Having pt locate and reach for cup in left visual field.  Grooming: Set up, Supervision/safety, Cueing for sequencing, Sitting Grooming Details (indicate cue type and reason): cues to maintain attention to task Upper Body Bathing: Maximal assistance, Sitting Lower Body Bathing: Moderate assistance, Sit to/from stand, Sitting/lateral leans Lower Body Bathing Details (indicate cue type and reason): Pt initiating useof LUE during functional tasks; Able to cross RLE over left to bath. Facilitation for forward lean/anterior weight shift to bath LLE;  lost attention and did not finish task Upper Body Dressing : Maximal assistance, Bed level Lower Body Dressing: Maximal assistance, Sit to/from stand Lower Body Dressing Details (indicate cue type and reason): Able to reach foot to assist wtih pulling up socks Toilet Transfer: Moderate assistance, Maximal assistance, +2 for physical assistance, +2 for safety/equipment(sara stedy to recliner) Toilet Transfer Details (indicate cue type and reason): Mod A +2 from elevate bed. Max A +2 for elevating from recliner. Use of sara stedy for blocking bil knees and provide bar to pull with BUEs. Toileting- Clothing Manipulation and Hygiene: Total assistance Toileting - Clothing Manipulation Details (indicate cue type and reason): Pt aware of urine collection bag leaking onto floor during session Functional mobility during ADLs: Maximal assistance, +2 for physical assistance General ADL Comments: Pt turned so that wife was on L at end of session. Educated pt/wife and on need to place meaningful items on L to increase attention to L; Educated wife on L neglect - wife asking about prognosis     Mobility   Overal bed mobility: Needs Assistance Bed Mobility: Rolling, Sidelying to Sit, Sit to Supine Rolling: Max assist Sidelying to sit: Max assist, +2 for physical assistance Supine to sit: Max assist, +2 for physical assistance Sit to supine: Max assist, +2 for physical assistance General bed mobility comments: assist to bend knees and L hand across him to roll to R, assist for legs off bed and trunk upright, to supine assist for trunk and legs and repositioning     Transfers   Overall transfer level: Needs assistance Equipment used: Ambulation equipment used Transfer via Lift Equipment: Stedy Transfers: Sit to/from Stand Sit to Stand: +2 physical assistance, Total assist Stand pivot transfers: (seated on the stedy) General transfer comment: pt not initiating sit to  stand despite two trials with assist for  hands on stedy and lifting hips with pad under him, BP too low to continue so returned to supine     Ambulation / Gait / Stairs / Wheelchair Mobility   Ambulation/Gait Ambulation/Gait assistance: Counsellor (Feet): 400 Feet Assistive device: None Gait Pattern/deviations: Step-through pattern General Gait Details: pt unable to ambulate at this time Gait velocity: functional Gait velocity interpretation: 1.31 - 2.62 ft/sec, indicative of limited community ambulator     Posture / Balance Dynamic Sitting Balance Sitting balance - Comments: initially with minguard for sitting balance, then attempting to get weigh on L UE and pt leaning forward, grabbing linens with R hand near catheter so held his hand and he would lean to L and forward when holding my hand on the R, with R and L HHA pt with improved balance, but still sagging forward with fatigue Balance Overall balance assessment: Needs assistance Sitting-balance support: Feet supported Sitting balance-Leahy Scale: Poor Sitting balance - Comments: initially with minguard for sitting balance, then attempting to get weigh on L UE and pt leaning forward, grabbing linens with R hand near catheter so held his hand and he would lean to L and forward when holding my hand on the R, with R and L HHA pt with improved balance, but still sagging forward with fatigue Postural control: Left lateral lean Standing balance support: Bilateral upper extremity supported, During functional activity Standing balance-Leahy Scale: Zero Standing balance comment: unable to stand this session     Special needs/care consideration Continuous Drip IV  0.9% NaCl with KCl 20 mEq/L infusion  Oxygen: 2L/min Skin: ecchymosis to right/left wrist, skin tear to posterior penis, wound/incision puncture groin right arterial access site-right femoral Diabetic management: yes Behavioral consideration: withdrawn  Designated visitor: Stanton Kidney (wife) and son Marden Noble          Previous Environmental health practitioner (from acute therapy documentation) Living Arrangements: Spouse/significant other Available Help at Discharge: Family, Available 24 hours/day Type of Home: House Home Layout: One level Home Access: Stairs to enter Entrance Stairs-Rails: Right Entrance Stairs-Number of Steps: 3 Bathroom Shower/Tub: Chiropodist: [place]: No   Discharge Living Setting Plans for Discharge Living Setting: House, Lives with (comment)(lives with wife) Type of Home at Discharge: House Discharge Home Layout: One level Discharge Home Access: Stairs to enter Entrance Stairs-Rails: None Entrance Stairs-Number of Steps: 2-3 steps Discharge Bathroom Shower/Tub: Tub/shower unit, Horticulturist, commercial: Standard Discharge Bathroom Accessibility: Yes How Accessible: Accessible via walker Does the patient have any problems obtaining your medications?: No   Social/Family/Support Systems Patient Roles: Spouse, Other (Comment)(has grown children; full time employee) Contact Information: wife: Stanton Kidney [phone] Anticipated Caregiver: wife +2 sons Anticipated Caregiver's Contact Information: see above Ability/Limitations of Caregiver: Min A Caregiver Availability: 24/7 Discharge Plan Discussed with Primary Caregiver: Yes(wife and pt) Is Caregiver In Agreement with Plan?: Yes Does Caregiver/Family have Issues with Lodging/Transportation while Pt is in Rehab?: No     Goals Patient/Family Goal for Rehab: PT/OT: Min A; SLP: Supervision/MIn A Expected length of stay: 21-24 days Cultural Considerations: NA Pt/Family Agrees to Admission and willing to participate: Yes Program Orientation Provided & Reviewed with Pt/Caregiver Including Roles  & Responsibilities: Yes(pt and wife)  Barriers to Discharge: Home environment access/layout  Barriers to Discharge Comments: steps to enter home     Decrease burden of Care through IP rehab  admission: NA     Possible need for SNF placement upon discharge:  Not anticipated; pt has good social support from family at DC and confirmed they are able to provide the anticipated assist level after CIR at DC. Anticipate pt can reach a Min A level to DC home safely.      Patient Condition: This patient's medical and functional status has changed since the consult dated: 01/25/20 in which the Rehabilitation Physician determined and documented that the patient's condition is appropriate for intensive rehabilitative care in an inpatient rehabilitation facility. See "History of Present Illness" (above) for medical update. Functional changes are: a decrease in functional ability from Max A +2 to total A with mechanical lift due to increase in withdrawn emotions. Patient's medical and functional status update has been discussed with the Rehabilitation physician and patient remains appropriate for inpatient rehabilitation. Will admit to inpatient rehab tomorrow, Saturday 02/01/20.    Preadmission Screen Completed By:  Raechel Ache, OT, 02/01/2020 4:30 PM ______________________________________________________________________   Discussed status with Dr. Milagros Evener on 02/01/20 at 4:08PM and received approval for admission Saturday 02/02/20.   Admission Coordinator:  Raechel Ache, time 4:08PM/Date 02/01/20.             Revision History

## 2020-02-02 NOTE — H&P (Signed)
Physical Medicine and Rehabilitation Admission H&P  CC: [facility]  HPI: Nicholas [name] is a 69 year old right-handed male history of hypertension, diabetes mellitus, hepatitis, BPH.  Per chart review patient lives with wife independent prior to admission.  Presented 01/14/2020 with nausea and headache and left-sided weakness.  CT angiogram of head and neck showed a large volume acute subarachnoid hemorrhage.  Large amount of subarachnoid blood seen in the basilar cisterns and extending into the sylvian fissure bilaterally right greater than left.  Interhemispheric subarachnoid hemorrhage present.  Negative for hydrocephalus.  Patient underwent cerebral angiogram balloon angioplasty of right internal carotid artery right middle cerebral artery and balloon angioplasty of basilar artery.  Keppra initiated for seizure prophylaxis.  Echocardiogram with ejection fraction of 70% with grade 1 diastolic dysfunction.  Maintained on Nimotop  for blood pressure control and vasospasms times protocol 21 days.  Cardiology services consulted 01/23/2020 for wide-complex tachycardia/hypotension and placed on amiodarone 200 mg daily as well is cleared for aspirin therapy...  Latest follow-up MRI of the brain showed mildly increased left subdural hematoma since 01/19/2020 9 mm in thickness.  Smaller right side subdural hematoma has regressed.  Associated mildly increased rightward midline shift was 7 mm.  Presently patient has been cleared to initiate Lovenox for DVT prophylaxis..  Palliative care consulted for goals of care.  Tolerating a regular diet.   Therapy evaluations completed and patient was admitted for a comprehensive rehab program.  Review of Systems  Constitutional: Negative for chills and fever.  HENT: Negative for hearing loss.   Eyes: Negative for blurred vision and double vision.  Respiratory: Negative for cough and shortness of breath.   Cardiovascular: Positive for leg swelling. Negative for chest pain and  palpitations.  Gastrointestinal: Positive for nausea.  Genitourinary: Positive for urgency. Negative for dysuria, flank pain and hematuria.  Musculoskeletal: Positive for joint pain.  Skin: Negative for rash.  Neurological: Positive for headaches.  All other systems reviewed and are negative.  Past Medical History:  Diagnosis Date  . BPH (benign prostatic hypertrophy) 12/20/2011   Dr. Cleatrice Burke  . Colon polyp    Dr. Vernell Barrier, it was recommended that he have a follow-up colonoscopy in 2010.  Marland Kitchen Deviated septum   . Diabetes mellitus without complication [facility])    Eye exam - 2011  . Hepatitis A   . HSV-1 (herpes simplex virus 1) infection   . Hyperlipidemia   . Hypertension   . Migraine headache    Past Surgical History:  Procedure Laterality Date  . IR ANGIO EXTERNAL CAROTID SEL EXT CAROTID BILAT MOD SED  01/15/2020  . IR ANGIO INTRA EXTRACRAN SEL INTERNAL CAROTID BILAT MOD SED  01/15/2020  . IR ANGIO INTRA EXTRACRAN SEL INTERNAL CAROTID BILAT MOD SED  01/31/2020  . IR ANGIO VERTEBRAL SEL VERTEBRAL BILAT MOD SED  01/15/2020  . IR ANGIO VERTEBRAL SEL VERTEBRAL BILAT MOD SED  01/31/2020  . IR ENDOVASC INTRACRANIAL INF OTHER THAN THROMBO ART INC DIAG ANGIO  01/18/2020  . IR ENDOVASC INTRACRANIAL INF OTHER THAN THROMBO ART INC DIAG ANGIO EA ADD  01/18/2020  . IR PTA VASOSPASM ADD DIFF  01/18/2020  . IR PTA VASOSPASM INITIAL  01/18/2020  . NASAL SEPTUM SURGERY    . RADIOLOGY WITH ANESTHESIA N/A 01/15/2020   Procedure: IR WITH ANESTHESIA;  Surgeon: Lisbeth Renshaw, MD;  Location[facility];  Service: Radiology;  Laterality: N/A;  . RADIOLOGY WITH ANESTHESIA N/A 01/18/2020   Procedure: IR WITH ANESTHESIA;  Surgeon: Lisbeth Renshaw, MD;  Location: [facility] OR;  Service: Radiology;  Laterality: N/A;   Family History  Problem Relation Age of Onset  . Diabetes Mother   . Neuropathy Mother   . Diabetes Father   . Cancer Father        Lung Cancer  . Diabetes Sister   . Diabetes Brother    Social  History:  reports that he has never smoked. He has never used smokeless tobacco. He reports current alcohol use. He reports that he does not use drugs. Allergies: No Known Allergies Medications Prior to Admission  Medication Sig Dispense Refill  . aspirin (ASPIRIN ADULT LOW STRENGTH) 81 MG EC tablet Take 81 mg by mouth daily. Swallow whole.    . cholecalciferol (VITAMIN D3) 25 MCG (1000 UNIT) tablet Take 1,000 Units by mouth daily.    . CRESTOR 40 MG tablet Take 1 tablet (40 mg total) by mouth daily. 90 tablet 1  . fenofibrate 160 MG tablet Take 1 tablet (160 mg total) by mouth daily. 90 tablet 3  . glipiZIDE (GLUCOTROL XL) 10 MG 24 hr tablet Take 1 tablet (10 mg total) by mouth daily with breakfast. (Patient not taking: Reported on 01/14/2020) 90 tablet 1  . insulin aspart (NOVOLOG) 100 UNIT/ML FlexPen Inject 75 Units into the skin daily.     . insulin detemir (LEVEMIR) 100 UNIT/ML FlexPen Inject 20 Units into the skin See admin instructions. Sliding scale : Takes 15 units if blood sugar more than 300. Max 20 units.    Marland Kitchen lisinopril (PRINIVIL,ZESTRIL) 20 MG tablet Take 1 tablet (20 mg total) by mouth daily. 90 tablet 3  . omega-3 acid ethyl esters (LOVAZA) 1 G capsule Take 2 capsules (2 g total) by mouth 2 (two) times daily. (Patient not taking: Reported on 01/14/2020) 360 capsule 1  . pioglitazone (ACTOS) 45 MG tablet Take 1 tablet (45 mg total) by mouth daily. (Patient not taking: Reported on 01/14/2020) 90 tablet 1  . sitaGLIPtin-metformin (JANUMET) 50-1000 MG per tablet Take 1 tablet by mouth 2 (two) times daily with a meal. (Patient not taking: Reported on 01/14/2020) 180 tablet 1  . XIGDUO XR 06-999 MG TB24 Take 1 tablet by mouth daily.      Drug Regimen Review Drug regimen was reviewed and remains appropriate with no significant issues identified  Home: Home Living Family/patient expects to be discharged to:: Private residence Living Arrangements: Spouse/significant other Available Help  at Discharge: Family, Available 24 hours/day Type of Home: House Home Access: Stairs to enter Secretary/administrator of Steps: 3 Entrance Stairs-Rails: Right Home Layout: One level Bathroom Shower/Tub: Engineer, manufacturing systems: Standard Home Equipment: Environmental consultant - 2 wheels, Crutches, Bedside commode   Functional History: Prior Function Level of Independence: Independent Comments: working as Electrical engineer Status:  Mobility: Bed Mobility Overal bed mobility: Needs Assistance Bed Mobility: Rolling, Sidelying to Sit, Sit to Supine Rolling: Max assist Sidelying to sit: Max assist, +2 for physical assistance Supine to sit: Max assist, +2 for physical assistance Sit to supine: Max assist, +2 for physical assistance General bed mobility comments: assist to bend knees and L hand across him to roll to R, assist for legs off bed and trunk upright, to supine assist for trunk and legs and repositioning Transfers Overall transfer level: Needs assistance Equipment used: Ambulation equipment used Transfer via Lift Equipment: Stedy Transfers: Sit to/from Stand Sit to Stand: +2 physical assistance, Total assist Stand pivot transfers: (seated on the stedy) General transfer comment: pt not initiating sit to stand  despite two trials with assist for hands on stedy and lifting hips with pad under him, BP too low to continue so returned to supine Ambulation/Gait Ambulation/Gait assistance: Min guard Gait Distance (Feet): 400 Feet Assistive device: None Gait Pattern/deviations: Step-through pattern General Gait Details: pt unable to ambulate at this time Gait velocity: functional Gait velocity interpretation: 1.31 - 2.62 ft/sec, indicative of limited community ambulator  ADL: ADL Overall ADL's : Needs assistance/impaired Eating/Feeding: Set up, Supervision/ safety, Sitting Eating/Feeding Details (indicate cue type and reason): Having pt locate and reach for cup in left visual  field.  Grooming: Set up, Supervision/safety, Cueing for sequencing, Sitting Grooming Details (indicate cue type and reason): cues to maintain attention to task Upper Body Bathing: Maximal assistance, Sitting Lower Body Bathing: Moderate assistance, Sit to/from stand, Sitting/lateral leans Lower Body Bathing Details (indicate cue type and reason): Pt initiating useof LUE during functional tasks; Able to cross RLE over left to bath. Facilitation for forward lean/anterior weight shift to bath LLE; lost attention and did not finish task Upper Body Dressing : Maximal assistance, Bed level Lower Body Dressing: Maximal assistance, Sit to/from stand Lower Body Dressing Details (indicate cue type and reason): Able to reach foot to assist wtih pulling up socks Toilet Transfer: Moderate assistance, Maximal assistance, +2 for physical assistance, +2 for safety/equipment(sara stedy to recliner) Toilet Transfer Details (indicate cue type and reason): Mod A +2 from elevate bed. Max A +2 for elevating from recliner. Use of sara stedy for blocking bil knees and provide bar to pull with BUEs. Toileting- Clothing Manipulation and Hygiene: Total assistance Toileting - Clothing Manipulation Details (indicate cue type and reason): Pt aware of urine collection bag leaking onto floor during session Functional mobility during ADLs: Maximal assistance, +2 for physical assistance General ADL Comments: Pt turned so that wife was on L at end of session. Educated pt/wife and on need to place meaningful items on L to increase attention to L; Educated wife on L neglect - wife asking about prognosis  Cognition: Cognition Overall Cognitive Status: Impaired/Different from baseline Arousal/Alertness: Awake/alert Orientation Level: Oriented to person, Disoriented to place, Disoriented to time, Disoriented to situation Attention: Selective Selective Attention: Impaired Selective Attention Impairment: Verbal basic Memory:  Impaired Memory Impairment: Storage deficit, Retrieval deficit, Decreased recall of new information Awareness: Impaired Awareness Impairment: Intellectual impairment, Emergent impairment Problem Solving: Impaired Problem Solving Impairment: Verbal complex Executive Function: Self Monitoring Self Monitoring: Impaired Self Monitoring Impairment: Functional basic Safety/Judgment: Impaired Comments: slow processing Cognition Arousal/Alertness: Awake/alert Behavior During Therapy: Flat affect Overall Cognitive Status: Impaired/Different from baseline Area of Impairment: Following commands, Awareness, Problem solving, Attention, Safety/judgement Current Attention Level: Focused Memory: Decreased short-term memory Following Commands: Follows one step commands inconsistently Safety/Judgement: Decreased awareness of safety, Decreased awareness of deficits Awareness: Emergent Problem Solving: Slow processing, Decreased initiation, Requires verbal cues, Requires tactile cues, Difficulty sequencing General Comments: nonverbal this session except to say Ow! when something hurts him   Physical Exam: There were no vitals taken for this visit.  Physical Exam  Vitals reviewed. Constitutional: He appears well-developed and well-nourished.  HENT:  Head: Normocephalic and atraumatic.  Eyes: Right eye exhibits no discharge. Left eye exhibits no discharge.  Right eye closed  Neck: No tracheal deviation present. No thyromegaly present.  Respiratory: Effort normal. No stridor. No respiratory distress.  GI: He exhibits distension.  Musculoskeletal:     Comments: LUE edema  Neurological: Patient is alert.  No acute distress.  Right gaze preference with left facial droop.  He does provide his name and age but limited historian.  Follows simple commands . Says "I'm in pain" but no other verbal output this exam Right lean Motor: Limited due to ability to follow commands and attention Right sided  minimal movement noted upper and lower extremities LUE/LLE: 0/5 Right ptosis  Skin: Skin is warm and dry.  Psychiatric:  Unable to assess due to mentation    Results for orders placed or performed during the hospital encounter of 02/02/20 (from the past 48 hour(s))  CBC     Status: Abnormal   Collection Time: 02/02/20  4:29 PM  Result Value Ref Range   WBC 11.6 (H) 4.0 - 10.5 K/uL   RBC 3.65 (L) 4.22 - 5.81 MIL/uL   Hemoglobin 10.6 (L) 13.0 - 17.0 g/dL   HCT 32.4 (L) 39.0 - 52.0 %   MCV 88.8 80.0 - 100.0 fL   MCH 29.0 26.0 - 34.0 pg   MCHC 32.7 30.0 - 36.0 g/dL   RDW 14.4 11.5 - 15.5 %   Platelets 416 (H) 150 - 400 K/uL   nRBC 0.0 0.0 - 0.2 %    Comment: Performed at [facility], [place] [address], [place], [place] [address]  Creatinine, serum     Status: None   Collection Time: 02/02/20  4:29 PM  Result Value Ref Range   Creatinine, Ser 0.62 0.61 - 1.24 mg/dL   GFR calc non Af Amer >60 >60 mL/min   GFR calc Af Amer >60 >60 mL/min    Comment: Performed at [facility] [address], [place], [place] [address]  Glucose, capillary     Status: Abnormal   Collection Time: 02/02/20  4:47 PM  Result Value Ref Range   Glucose-Capillary 180 (H) 70 - 99 mg/dL    Comment: Glucose reference range applies only to samples taken after fasting for at least 8 hours.   VAS [place] TRANSCRANIAL DOPPLER  Result Date: 02/01/2020  Transcranial Doppler Indications: Subarachnoid hemorrhage. Comparison Study: 01/30/20 previous Performing Technologist: Abram Sander RVS  Examination Guidelines: A complete evaluation includes B-mode imaging, spectral Doppler, color Doppler, and power Doppler as needed of all accessible portions of each vessel. Bilateral testing is considered an integral part of a complete examination. Limited examinations for reoccurring indications may be performed as noted.  +----------+-------------+----------+-----------+-------+ RIGHT TCD Right VM (cm)Depth  (cm)PulsatilityComment +----------+-------------+----------+-----------+-------+ MCA          132.00                 0.80            +----------+-------------+----------+-----------+-------+ ACA          -19.00                 0.88            +----------+-------------+----------+-----------+-------+ PCA           26.00                 0.91            +----------+-------------+----------+-----------+-------+ Opthalmic     23.00                 1.87            +----------+-------------+----------+-----------+-------+ ICA siphon    41.00                 1.02            +----------+-------------+----------+-----------+-------+  Vertebral    -43.00                 1.33            +----------+-------------+----------+-----------+-------+  +----------+------------+----------+-----------+-------+ LEFT TCD  Left VM (cm)Depth (cm)PulsatilityComment +----------+------------+----------+-----------+-------+ MCA          58.00                 0.85            +----------+------------+----------+-----------+-------+ Term ICA     20.00                 1.20            +----------+------------+----------+-----------+-------+ PCA          33.00                 0.69            +----------+------------+----------+-----------+-------+ Opthalmic    17.00                 1.81            +----------+------------+----------+-----------+-------+ ICA siphon   120.00                1.14            +----------+------------+----------+-----------+-------+ Vertebral    -47.00                1.43            +----------+------------+----------+-----------+-------+  +------------+-------+-------+             VM cm/sComment +------------+-------+-------+ Prox Basilar-51.00         +------------+-------+-------+ Summary:  Persistent elevated right middle cerebral and left carotid siphon mean flow velocities suggest mild vasospasm.Normal mean flow velocities in  remaining identified vessels of anterior and posterior cerebral circulation *See table(s) above for TCD measurements and observations.  Diagnosing physician: Delia HeadyPramod Sethi MD Electronically signed by Delia HeadyPramod Sethi MD on 02/01/2020 at 5:07:16 PM.    Final    Medical Problem List and Plan: 1.  Left-sided weakness secondary to left greater than right subdural hematoma.  Status post cerebral angiogram balloon angioplasty  -patient may shower  -ELOS/Goals: min assist  with PT, min assist with OT, supervision and min assist with SLP. 27-30 days. 2.  Antithrombotics: -DVT/anticoagulation: Lovenox initiated 01/29/2020  -antiplatelet therapy: Aspirin 325 mg daily 3. Pain Management: Oxycodone as needed, Robaxin as needed 4. Mood: Provide emotional support  -antipsychotic agents: N/A 5. Neuropsych: This patient is not capable of making decisions on his own behalf. 6. Skin/Wound Care: Routine skin checks 7. Fluids/Electrolytes/Nutrition: Routine in and outs with follow-up chemistries. Poor oral intake.  8.  Diabetes mellitus.  Hemoglobin A1c 7.7.  Presently on Levemir 28 units twice daily.  Check blood sugars before meals and at bedtime.  Patient on Tresiba 35 units daily, Glucotrol 10 mg daily, Actos 45 mg daily, Janumet 50-1000 mg twice daily, NovoLog 75 units daily prior to admission.  Resume as needed  5/15: CBG elevated this morning to 204 but only mildly elevated last night. Continue to monitor. Currently on Levemir 30U BID.  9.  Seizure prophylaxis.  Keppra 500 mg twice daily 10.  Arrhythmia/wide-complex tachycardia.  Amiodarone 200 mg daily x4 weeks.  Follow-up cardiology services 11.  Hypertension.  Autoregulate on Nimotop. 12.  Hyperlipidemia.  Crestor/fenofibrate 13. Mild vasospasm evident on 5/14 vascular transcranial doppler.  Mcarthur RossettiDaniel J Angiulli, PA-C 02/01/2020    I have personally  performed a face to face diagnostic evaluation, including, but not limited to relevant history and physical exam  findings, of this patient and developed relevant assessment and plan.  Additionally, I have reviewed and concur with the physician assistant's documentation above.  The patient's status has not changed. The original post admission physician evaluation remains appropriate, and any changes from the pre-admission screening or documentation from the acute chart are noted above.   Nicholas Soda, MD

## 2020-02-02 NOTE — Progress Notes (Signed)
Patient ID: Nicholas [name], male   DOB: 10/09/1950, 69 y.o.   MRN: [id number] Resting quietly.  Seems stable.  CIR candidate

## 2020-02-03 ENCOUNTER — Inpatient Hospital Stay (HOSPITAL_COMMUNITY): Payer: BC Managed Care – PPO

## 2020-02-03 ENCOUNTER — Inpatient Hospital Stay (HOSPITAL_COMMUNITY): Payer: BC Managed Care – PPO | Admitting: Occupational Therapy

## 2020-02-03 ENCOUNTER — Inpatient Hospital Stay (HOSPITAL_COMMUNITY): Payer: BC Managed Care – PPO | Admitting: Speech Pathology

## 2020-02-03 DIAGNOSIS — I609 Nontraumatic subarachnoid hemorrhage, unspecified: Secondary | ICD-10-CM

## 2020-02-03 LAB — GLUCOSE, CAPILLARY
Glucose-Capillary: 150 mg/dL — ABNORMAL HIGH (ref 70–99)
Glucose-Capillary: 110 mg/dL — ABNORMAL HIGH (ref 70–99)
Glucose-Capillary: 177 mg/dL — ABNORMAL HIGH (ref 70–99)
Glucose-Capillary: 217 mg/dL — ABNORMAL HIGH (ref 70–99)
Glucose-Capillary: 258 mg/dL — ABNORMAL HIGH (ref 70–99)
Glucose-Capillary: 213 mg/dL — ABNORMAL HIGH (ref 70–99)

## 2020-02-03 MED ORDER — INSULIN DETEMIR 100 UNIT/ML ~~LOC~~ SOLN
31.0000 [IU] | Freq: Two times a day (BID) | SUBCUTANEOUS | Status: DC
  Administered 2020-02-03 – 2020-02-13 (×19): 31 [IU] via SUBCUTANEOUS
  Filled 2020-02-03 (×21): qty 0.31

## 2020-02-03 MED ORDER — SERTRALINE HCL 50 MG PO TABS
25.0000 mg | ORAL_TABLET | Freq: Every day | ORAL | Status: DC
  Administered 2020-02-03 – 2020-03-07 (×34): 25 mg via ORAL
  Filled 2020-02-03 (×34): qty 1

## 2020-02-03 MED ORDER — BACLOFEN 5 MG HALF TABLET
5.0000 mg | ORAL_TABLET | Freq: Two times a day (BID) | ORAL | Status: DC
  Administered 2020-02-03: 5 mg via ORAL
  Filled 2020-02-03 (×2): qty 1

## 2020-02-03 NOTE — Progress Notes (Signed)
Wife at bedside beginning of shift. Pt took medication with pudding. Pt excessively urinating with incontinence of urine and bowels. Pt was assisted with peri-care Q2-4 hrs and each time the pt had urine drenched on bed covers even with incontinence brief on. Pt last changed at 06:45 and left with bed alarm on/ bed lowest position/ call bell within reach.

## 2020-02-03 NOTE — Evaluation (Signed)
Speech Language Pathology Assessment and Plan  Patient Details  Name: Nicholas [name] MRN: [id number] Date of Birth: November 26, 1950  SLP Diagnosis: Cognitive Impairments  Rehab Potential: Good ELOS: 21-24 days    Today's Date: 02/03/2020 SLP Individual Time: 3875-6433 SLP Individual Time Calculation (min): 60 min   Problem List:  Patient Active Problem List   Diagnosis Date Noted  . Intracranial vasospasm   . Essential hypertension   . Controlled type 2 diabetes mellitus with hyperglycemia ([place])   . Hepatitis   . Abdominal distention   . NSVT (nonsustained ventricular tachycardia) ([place])   . Rate-related bundle branch block   . Acute hypoxemic respiratory failure ([place])   . Aneurysm ([place])   . SAH (subarachnoid hemorrhage) ([place]) 01/14/2020  . Other and unspecified hyperlipidemia 07/17/2013  . Need for prophylactic vaccination and inoculation against influenza 07/17/2013  . Type II or unspecified type diabetes mellitus without mention of complication, not stated as uncontrolled 04/12/2013  . 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 Elevated transaminase measurement 04/12/2013  . Type II or unspecified type diabetes mellitus without mention of complication, uncontrolled 01/05/2013  . Essential hypertension, benign 01/05/2013  . BPH (benign prostatic hyperplasia) 01/05/2013  . Overweight(278.02) 01/05/2013   Past Medical History:  Past Medical History:  Diagnosis Date  . BPH (benign prostatic hypertrophy) 12/20/2011   Dr. Thomasene Mohair  . Colon polyp    Dr. Valli Glance, it was recommended that he have a follow-up colonoscopy in 2010.  Marland Kitchen Deviated septum   . Diabetes mellitus without complication [facility])    Eye exam - 2011  . Hepatitis A   . HSV-1 (herpes simplex virus 1) infection   . Hyperlipidemia   . Hypertension   . Migraine headache    Past Surgical History:  Past Surgical History:  Procedure Laterality Date  . IR ANGIO EXTERNAL CAROTID SEL EXT CAROTID BILAT MOD SED  01/15/2020  . IR ANGIO  INTRA EXTRACRAN SEL INTERNAL CAROTID BILAT MOD SED  01/15/2020  . IR ANGIO INTRA EXTRACRAN SEL INTERNAL CAROTID BILAT MOD SED  01/31/2020  . IR ANGIO VERTEBRAL SEL VERTEBRAL BILAT MOD SED  01/15/2020  . IR ANGIO VERTEBRAL SEL VERTEBRAL BILAT MOD SED  01/31/2020  . IR ENDOVASC INTRACRANIAL INF OTHER THAN THROMBO ART INC DIAG ANGIO  01/18/2020  . IR ENDOVASC INTRACRANIAL INF OTHER THAN THROMBO ART INC DIAG ANGIO EA ADD  01/18/2020  . IR PTA VASOSPASM ADD DIFF  01/18/2020  . IR PTA VASOSPASM INITIAL  01/18/2020  . NASAL SEPTUM SURGERY    . RADIOLOGY WITH ANESTHESIA N/A 01/15/2020   Procedure: IR WITH ANESTHESIA;  Surgeon: Consuella Lose, MD;  Location: [facility];  Service: Radiology;  Laterality: N/A;  . RADIOLOGY WITH ANESTHESIA N/A 01/18/2020   Procedure: IR WITH ANESTHESIA;  Surgeon: Consuella Lose, MD;  Location: [place];  Service: Radiology;  Laterality: N/A;    Assessment / Plan / Recommendation Clinical Impression HPI: Nicholas Gittleman. [name] is a 69 year old right-handed male history of hypertension, diabetes mellitus, hepatitis, BPH.  Per chart review patient lives with wife independent prior to admission.  Presented 01/14/2020 with nausea and headache and left-sided weakness.  CT angiogram of head and neck showed a large volume acute subarachnoid hemorrhage.  Large amount of subarachnoid blood seen in the basilar cisterns and extending into the sylvian fissure bilaterally right greater than left.  Interhemispheric subarachnoid hemorrhage present.  Negative for hydrocephalus.  Patient underwent cerebral angiogram balloon angioplasty of right internal carotid artery right middle cerebral artery and balloon angioplasty  of basilar artery.  Keppra initiated for seizure prophylaxis.  Echocardiogram with ejection fraction of 70% with grade 1 diastolic dysfunction.  Maintained on Nimotop  for blood pressure control and vasospasms times protocol 21 days.  Cardiology services consulted 01/23/2020 for wide-complex  tachycardia/hypotension and placed on amiodarone 200 mg daily as well is cleared for aspirin therapy...  Latest follow-up MRI of the brain showed mildly increased left subdural hematoma since 01/19/2020 9 mm in thickness.  Smaller right side subdural hematoma has regressed.  Associated mildly increased rightward midline shift was 7 mm.  Presently patient has been cleared to initiate Lovenox for DVT prophylaxis..  Palliative care consulted for goals of care.  Tolerating a regular diet.   Therapy evaluations completed and patient was admitted for a comprehensive rehab program.  Clinical swallow evaluation- Patient presents with a functional oral phase of swallowing and no s/sx of pharyngeal dysphagia. Patient demonstrated functional mastication of a regular solid with no oral residues noted. No overt s/sx of aspiration were observed with thin liquids or regular solids. Recommend continue a regular diet and thin liquids. No further skilled SLP services warranted to address swallowing.  Speech/language evaluation- Assessment was limited secondary to patient's limited responses despite max encouragement. Patient was mostly non-verbal with intermittent delayed phrases and short sentences. Patient completed a confrontation naming task with 100% accuracy. Patient would intermittently respond to basic biographical yes/no questions following max verbal cues. Patient responded to basic biographical -wh questions in 1 out of 5 opportunities. Patient followed basic 1 step directions with 100% accuracy. Patient was unable to follow basic 2 step directions. Patient made no attempts to respond to questions targeting: orientation, memory, problem solving, and safety awareness. Left inattention was noted in functional task of eating breakfast.  Attention to task was all reduced during functional task of consuming breakfast tray. Patient would benefit from skilled SLP services during CIR targeting cognitive-linguistic skills to  increase independence upon discharge.    Skilled Therapeutic Interventions          Clinical Swallow Evaluation and Speech/Langauge Evaluation  SLP Assessment  Patient will need skilled Speech Lanaguage Pathology Services during CIR admission    Recommendations  SLP Diet Recommendations: Thin Liquid Administration via: Cup;Straw Medication Administration: Whole meds with liquid Supervision: Staff to assist with self feeding Compensations: Slow rate Postural Changes and/or Swallow Maneuvers: Seated upright 90 degrees Oral Care Recommendations: Oral care BID Recommendations for Other Services: Neuropsych consult Patient destination: Home Follow up Recommendations: Home Health SLP Equipment Recommended: None recommended by SLP    SLP Frequency 3 to 5 out of 7 days   SLP Duration  SLP Intensity  SLP Treatment/Interventions 21-24 days  Minumum of 1-2 x/day, 30 to 90 minutes  Cognitive remediation/compensation;Speech/Language facilitation;Cueing hierarchy;Functional tasks;Internal/external aids;Patient/family education;Therapeutic Exercise;Therapeutic Activities    Pain Pain Assessment Pain Scale: Faces Faces Pain Scale: No hurt  Prior Functioning Cognitive/Linguistic Baseline: Information not available  SLP Evaluation Cognition Overall Cognitive Status: No family/caregiver present to determine baseline cognitive functioning Arousal/Alertness: Awake/alert Attention: Sustained Sustained Attention: Impaired Sustained Attention Impairment: Verbal basic  Comprehension Auditory Comprehension Overall Auditory Comprehension: Impaired Yes/No Questions: Impaired Basic Biographical Questions: 51-75% accurate Basic Immediate Environment Questions: 50-74% accurate Commands: Impaired Two Step Basic Commands: 0-24% accurate Interfering Components: Processing speed Visual Recognition/Discrimination Discrimination: Not tested Reading Comprehension Reading Status: Not  tested Expression Expression Primary Mode of Expression: Verbal Verbal Expression Overall Verbal Expression: Impaired Initiation: Impaired Level of Generative/Spontaneous Verbalization: Phrase Naming: No impairment Written Expression Written Expression: Not  tested Oral Motor Motor Speech Overall Motor Speech: Appears within functional limits for tasks assessed Intelligibility: Intelligible  Bedside Swallowing Assessment General Diet Prior to this Study: Thin liquids;Regular Temperature Spikes Noted: No Respiratory Status: Room air Behavior/Cognition: Alert;Doesn't follow directions Oral Cavity - Dentition: Adequate natural dentition Vision: Functional for self-feeding Patient Positioning: Upright in bed Baseline Vocal Quality: Normal Volitional Swallow: Unable to elicit  Oral Care Assessment Does patient have any of the following "high(er) risk" factors?: None of the above Does patient have any of the following "at risk" factors?: None of the above Patient is LOW RISK: Follow universal precautions (see row information) Thin Liquid Thin Liquid: Within functional limits Presentation: Straw Solid Solid: Within functional limits Presentation: Spoon BSE Assessment Risk for Aspiration Impact on safety and function: Mild aspiration risk Other Related Risk Factors: Cognitive impairment  Short Term Goals: Week 1: SLP Short Term Goal 1 (Week 1): Patient will participate in further assessment of cognitive skills. SLP Short Term Goal 2 (Week 1): Patient will respond to basic yes/no questions targeting wants/needs in 8 out of 10 opportunities with max A. SLP Short Term Goal 3 (Week 1): Patient will follow 2 step directions with 80% accuracy and max A. SLP Short Term Goal 4 (Week 1): Patient will participate in visual scanning tasks scannning left of midline in 5 of 10 opportunities with max A. SLP Short Term Goal 5 (Week 1): Patient will participate in a sustained attention task for  5 minutes with max A.  Refer to Care Plan for Long Term Goals  Recommendations for other services: Neuropsych  Discharge Criteria: Patient will be discharged from SLP if patient refuses treatment 3 consecutive times without medical reason, if treatment goals not met, if there is a change in medical status, if patient makes no progress towards goals or if patient is discharged from hospital.  The above assessment, treatment plan, treatment alternatives and goals were discussed and mutually agreed upon: No family available/patient unable  Cristy Folks 02/03/2020, 8:42 AM

## 2020-02-03 NOTE — Progress Notes (Addendum)
[facility] PHYSICAL MEDICINE & REHABILITATION PROGRESS NOTE   Subjective/Complaints: Mr. Rock has no complaints this morning. Wife feels he is depressed. He has never been on an antidepressant in the past. She feels that he is sleeping well at night and not agitated.  Son is also at bedside now and feels patient is in pain. Not sure where the pain is located but feels that muscle spasm medications helped him.  Review of Systems  Constitutional: Negative for chills and fever.  HENT: Negative for hearing loss.   Eyes: Negative for blurred vision and double vision.  Respiratory: Negative for cough and shortness of breath.   Cardiovascular: Positive for leg swelling. Negative for chest pain and palpitations.  Gastrointestinal: Positive for nausea.  Genitourinary: Positive for urgency. Negative for dysuria, flank pain and hematuria.  Musculoskeletal: Positive for joint pain.  Skin: Negative for rash.  Neurological: Positive for headaches.  All other systems reviewed and are negative.  Objective:   No results found. Recent Labs    02/02/20 1629  WBC 11.6*  HGB 10.6*  HCT 32.4*  PLT 416*   Recent Labs    02/02/20 1629  CREATININE 0.62    Intake/Output Summary (Last 24 hours) at 02/03/2020 1602 Last data filed at 02/02/2020 2130 Gross per 24 hour  Intake 150 ml  Output 1925 ml  Net -1775 ml     Physical Exam: Vital Signs Blood pressure (!) 155/78, pulse 89, temperature 99.1 F (37.3 C), temperature source Oral, resp. rate 18, height 6' (1.829 m), weight 89.8 kg, SpO2 97 %. Vitalsreviewed. Constitutional: He appearswell-developedand well-nourished.  HENT:  Head:Normocephalicand atraumatic.  Eyes: Right eye exhibitsno discharge. Left eye exhibitsno discharge. Right eye closed Neck:No tracheal deviationpresent. No thyromegalypresent.  Respiratory:Effort normal. Nostridor. Norespiratory distress.  GI: He exhibitsdistension.  Musculoskeletal:   Comments: LUE edema Neurological: Patient is alert.  No acute distress.  Right gaze preference with left facial droop.  He does provide his name and age but limited historian.  Follows simple commands . Says "I'm in pain" but no other verbal output this exam Right lean Motor: Limited due to ability to follow commands and attention Right sided minimal movement noted upper and lower extremities LUE/LLE: 0/5 Right ptosis MAS 1+ LUE EF Skin: Skin iswarmand dry.  Psychiatric: Unable to assess due to mentation  Assessment/Plan: 1. Functional deficits secondary to [facility] which require 3+ hours per day of interdisciplinary therapy in a comprehensive inpatient rehab setting.  Physiatrist is providing close team supervision and 24 hour management of active medical problems listed below.  Physiatrist and rehab team continue to assess barriers to discharge/monitor patient progress toward functional and medical goals  Care Tool:  Bathing              Bathing assist       Upper Body Dressing/Undressing Upper body dressing        Upper body assist      Lower Body Dressing/Undressing Lower body dressing      What is the patient wearing?: Incontinence brief     Lower body assist Assist for lower body dressing: Total Assistance - Patient < 25%     Toileting Toileting    Toileting assist Assist for toileting: 2 Helpers     Transfers Chair/bed transfer  Transfers assist     Chair/bed transfer assist level: 2 Helpers     Locomotion Ambulation   Ambulation assist   Ambulation activity did not occur: Safety/medical concerns  Walk 10 feet activity   Assist  Walk 10 feet activity did not occur: Safety/medical concerns        Walk 50 feet activity   Assist Walk 50 feet with 2 turns activity did not occur: Safety/medical concerns         Walk 150 feet activity   Assist Walk 150 feet activity did not occur: Safety/medical concerns          Walk 10 feet on uneven surface  activity   Assist Walk 10 feet on uneven surfaces activity did not occur: Safety/medical concerns         Wheelchair     Assist Will patient use wheelchair at discharge?: Yes Type of Wheelchair: Manual    Wheelchair assist level: Dependent - Patient 0% Max wheelchair distance: 150 ft    Wheelchair 50 feet with 2 turns activity    Assist        Assist Level: Dependent - Patient 0%   Wheelchair 150 feet activity     Assist      Assist Level: Dependent - Patient 0%   Blood pressure (!) 155/78, pulse 89, temperature 99.1 F (37.3 C), temperature source Oral, resp. rate 18, height 6' (1.829 m), weight 89.8 kg, SpO2 97 %.    Medical Problem List and Plan: 1.  Left-sided weakness secondary to left greater than right subdural hematoma.  Status post cerebral angiogram balloon angioplasty             -patient may shower             -ELOS/Goals: min assistwith PT, min assistwith OT, supervision and min assistwith SLP. 27-30 days.  -Continue CIR 2.  Antithrombotics: -DVT/anticoagulation: Lovenox initiated 01/29/2020             -antiplatelet therapy: Aspirin 325 mg daily 3. Pain Management: Oxycodone as needed, Robaxin as needed 4. Mood: Provide emotional support             -antipsychotic agents: N/A  5/16: Wife states patient is depressed. Start Zoloft daily. May benefit from neuropsych eval 5. Neuropsych: This patient is not capable of making decisions on his own behalf. 6. Skin/Wound Care: Routine skin checks 7. Fluids/Electrolytes/Nutrition: Routine in and outs with follow-up chemistries. Poor oral intake. Carb modified diet.  8.  Diabetes mellitus.  Hemoglobin A1c 7.7.  Presently on Levemir 28 units twice daily.  Check blood sugars before meals and at bedtime.  Patient on Tresiba 35 units daily, Glucotrol 10 mg daily, Actos 45 mg daily, Janumet 50-1000 mg twice daily, NovoLog 75 units daily prior to admission.   Resume as needed             5/15: CBG elevated this morning to 204 but only mildly elevated last night. Continue to monitor. Currently on Levemir 30U BID.   5/16: CBGs continue to be elevated. Increase Levemir to 31U BID 9.  Seizure prophylaxis.  Keppra 500 mg twice daily 10.  Arrhythmia/wide-complex tachycardia.  Amiodarone 200 mg daily x4 weeks.  Follow-up cardiology services 11.  Hypertension.  Autoregulate on Nimotop. 12.  Hyperlipidemia.  Crestor/fenofibrate 13. Mild vasospasm evident on 5/14 vascular transcranial doppler. 14. Spasticity: MAS 1+ at LUE EF. Otherwise with minimal spasticity. Patient does complain of pain but cannot localize site of pain. Son says pain was relieved by muscle spasm medications. Will start Baclofen 5mg  TID for pain relief and spasticity. Discussed monitoring for sedation with family. BP elevated and should tolerate medication.    LOS: 1  days A FACE TO FACE EVALUATION WAS PERFORMED  Drema Pry Rayson Rando 02/03/2020, 4:02 PM

## 2020-02-03 NOTE — Evaluation (Signed)
Physical Therapy Assessment and Plan  Patient Details  Name: Nicholas [name] MRN: [id number] Date of Birth: [birth year]  PT Diagnosis: Abnormal posture, Cognitive deficits, Difficulty walking, Hemiparesis non-dominant, Impaired cognition, and Muscle weakness Rehab Potential: Good ELOS: 4 weeks   Today's Date: 02/03/2020 PT Individual Time: 1100-1208 PT Individual Time Calculation (min): 68 min    Problem List:  Patient Active Problem List   Diagnosis Date Noted   Intracranial vasospasm    Essential hypertension    Controlled type 2 diabetes mellitus with hyperglycemia ([place])    Hepatitis    Abdominal distention    NSVT (nonsustained ventricular tachycardia) ([place])    Rate-related bundle branch block    Acute hypoxemic respiratory failure ([place])    Aneurysm ([place])    SAH (subarachnoid hemorrhage) (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 Thomasene Mohair   Colon polyp    Dr. Valli Glance, it was recommended that he have a follow-up colonoscopy in 2010.   Deviated septum    Diabetes mellitus without complication ([place])    Eye exam - 2011   Hepatitis A    HSV-1 (herpes simplex virus 1) infection    Hyperlipidemia    Hypertension    Migraine headache    Past Surgical History:  Past Surgical History:  Procedure Laterality Date   IR ANGIO EXTERNAL CAROTID  SEL EXT CAROTID BILAT MOD SED  01/15/2020   IR ANGIO INTRA EXTRACRAN SEL INTERNAL CAROTID BILAT MOD SED  01/15/2020   IR ANGIO INTRA EXTRACRAN SEL INTERNAL CAROTID BILAT MOD SED  01/31/2020   IR ANGIO VERTEBRAL SEL VERTEBRAL BILAT MOD SED  01/15/2020   IR ANGIO VERTEBRAL SEL VERTEBRAL BILAT MOD SED  01/31/2020   IR ENDOVASC INTRACRANIAL INF OTHER THAN THROMBO ART INC DIAG ANGIO  01/18/2020   IR ENDOVASC INTRACRANIAL INF OTHER THAN THROMBO ART INC DIAG ANGIO EA ADD  01/18/2020   IR PTA VASOSPASM ADD DIFF  01/18/2020   IR PTA VASOSPASM INITIAL  01/18/2020   NASAL SEPTUM SURGERY     RADIOLOGY WITH ANESTHESIA N/A 01/15/2020   Procedure: IR WITH ANESTHESIA;  Surgeon: Consuella Lose, MD;  Location: [facility];  Service: Radiology;  Laterality: N/A;   RADIOLOGY WITH ANESTHESIA N/A 01/18/2020   Procedure: IR WITH ANESTHESIA;  Surgeon: Consuella Lose, MD;  Location: [facility];  Service: Radiology;  Laterality: N/A;    Assessment & Plan Clinical Impression: Patient is a 69 y.o. year old male with  history of hypertension, diabetes mellitus, hepatitis, BPH.  Per chart review patient lives with wife independent prior to admission.  Presented 01/14/2020 with nausea and headache and left-sided weakness.  CT angiogram of head and neck showed a large volume acute subarachnoid hemorrhage.  Large amount of subarachnoid blood seen in the basilar cisterns and extending into the sylvian fissure bilaterally right greater than left.  Interhemispheric subarachnoid hemorrhage present.  Negative for hydrocephalus.  Patient underwent cerebral angiogram balloon angioplasty of right internal  carotid artery right middle cerebral artery and balloon angioplasty of basilar artery.  Keppra initiated for seizure prophylaxis.  Echocardiogram with ejection fraction of 70% with grade 1 diastolic dysfunction.  Maintained on Nimotop  for blood pressure control and vasospasms times protocol 21 days.  Cardiology services consulted 01/23/2020 for  wide-complex tachycardia/hypotension and placed on amiodarone 200 mg daily as well is cleared for aspirin therapy...  Latest follow-up MRI of the brain showed mildly increased left subdural hematoma since 01/19/2020 9 mm in thickness.  Smaller right side subdural hematoma has regressed.  Associated mildly increased rightward midline shift was 7 mm.  Presently patient has been cleared to initiate Lovenox for DVT prophylaxis..  Palliative care consulted for goals of care.  Tolerating a regular diet.   Therapy evaluations completed and patient was admitted for a comprehensive rehab program.  Patient transferred to CIR on 02/02/2020 .   Patient currently requires total with mobility secondary to muscle weakness and muscle joint tightness, abnormal tone and decreased coordination, decreased midline orientation and decreased attention to left, decreased attention, and decreased sitting balance, decreased standing balance, decreased postural control, hemiplegia, and decreased balance strategies.  Prior to hospitalization, patient was independent  with mobility and lived with Spouse in a House home.  Home access is 2Stairs to enter.  Patient will benefit from skilled PT intervention to maximize safe functional mobility, minimize fall risk, and decrease caregiver burden for planned discharge home with 24 hour assist.  Anticipate patient will benefit from follow up [place] at discharge.  PT - End of Session Activity Tolerance: Tolerates 30+ min activity with multiple rests Endurance Deficit: Yes PT Assessment Rehab Potential (ACUTE/IP ONLY): Good PT Barriers to Discharge: Home environment access/layout;Behavior PT Patient demonstrates impairments in the following area(s): Balance;Behavior;Safety;Sensory;Edema;Endurance;Motor;Pain;Nutrition PT Transfers Functional Problem(s): Bed Mobility;Bed to Chair;Car;Furniture PT Locomotion Functional Problem(s): Wheelchair Mobility;Ambulation;Stairs PT Plan PT Intensity: Minimum  of 1-2 x/day ,45 to 90 minutes PT Frequency: 5 out of 7 days PT Duration Estimated Length of Stay: 4 weeks PT Treatment/Interventions: Ambulation/gait training;Neuromuscular re-education;DME/adaptive equipment instruction;Community reintegration;Psychosocial support;Stair training;UE/LE Strength taining/ROM;Wheelchair propulsion/positioning;UE/LE Coordination activities;Therapeutic Activities;Skin care/wound management;Pain management;Functional electrical stimulation;Discharge planning;Balance/vestibular training;Cognitive remediation/compensation;Disease management/prevention;Functional mobility training;Patient/family education;Splinting/orthotics;Therapeutic Exercise;Visual/perceptual remediation/compensation PT Transfers Anticipated Outcome(s): min assist PT Locomotion Anticipated Outcome(s): mod assist PT Recommendation Recommendations for Other Services: Neuropsych consult Follow Up Recommendations: Home health PT Patient destination: Home Equipment Recommended: To be determined  Skilled Therapeutic Intervention Evaluation completed (see details above and below) with education on PT POC and goals and individual treatment initiated with focus on functional mobility, transfers, OOB activity, positioning and education. Pt sidelying in bed upon PT arrival, with R head preference noted. Pt unable to actively move head to midline, requires active assist from therapist. Pt transferred to sitting this session with max-total assist, in sitting pt initially requiring mod-max assist with R lateral lean. Pt able to achieve midline sitting balance with min assist with B UE support, pt with L UE active movement but unable to activate L LE. Pt transferred back to supine with max assist while therapist retrieved TIS w/c. Pt transferred back to sitting max +2 assist and performed sit<>stand within stedy max +2 assist, facilitation and cues for increased extension in standing - pt maintains flexed posture. Pt  declines dizziness today with OOB mobility. Dependent transfer to TIS w/c with stedy, pt positioned and reclined in w/c. Therapist checked vitals with BP 105/53, HR 97 bpm, declines symptoms. Therapist donned teds this session total assist. With pt in w/c worked on  active and passive L cervical rotation/visual scanning, education provided to family regarding working on this between therapies when able. Discussed working on L attention and encouraging use of L UE for functional activities. Pt left in TIS w/c in care of family.    PT Evaluation Precautions/Restrictions Precautions Precautions: None Precaution Comments: Lt hemiparesis with Lt neglect  Restrictions Weight Bearing Restrictions: No Other Position/Activity Restrictions: keep BP >160 General   Vital Signs  Pain Pain Assessment Pain Scale: 0-10 Pain Score: 0-No pain Home Living/Prior Functioning Home Living Available Help at Discharge: Family;Available 24 hours/day Type of Home: House Home Access: Stairs to enter [employer] of Steps: 2 Entrance Stairs-Rails: None Home Layout: One level Bathroom Shower/Tub: Chiropodist: Standard Additional Comments: information taken per chart review, need to confirm  Lives With: Spouse Prior Function Level of Independence: Independent with basic ADLs;Independent with gait  Able to Take Stairs?: Yes Driving: Yes 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 Status: Impaired/Different from baseline Arousal/Alertness: Awake/alert Orientation Level: Oriented to person Attention: Sustained Sustained Attention: Impaired Sustained Attention Impairment: Verbal basic Selective Attention: Impaired Selective Attention Impairment: Verbal basic Safety/Judgment: Impaired Comments: slow processing Sensation Coordination Gross Motor Movements are Fluid and Coordinated: No Fine Motor Movements are Fluid and  Coordinated: No Coordination and Movement Description: coordination impaired secondary to hemiparesis Motor  Motor Motor: Hemiplegia;Abnormal tone;Abnormal postural alignment and control Motor - Skilled Clinical Observations: L hemiparesis LE>UE  Mobility Bed Mobility Bed Mobility: Rolling Right;Rolling Left;Supine to Sit;Sit to Supine Rolling Right: 2 Helpers Rolling Left: 2 Helpers Supine to Sit: 2 Helpers Sit to Supine: 2 Helpers Transfers Transfers: Sit to [employer] via Scientist, research (life sciences) to Stand: 2 Press photographer (Assistive device): (stedy) Transfer via Lift Equipment: Animal nutritionist: No  Trunk/Postural Assessment  Cervical Assessment Cervical Assessment: Exceptions to WFL(head rests in cervical flexion,R rotation) Thoracic Assessment Thoracic Assessment: Exceptions to WFL(rounded shoulders/kyphotic) Lumbar Assessment Lumbar Assessment: Exceptions to WFL(posterior pelvic tilt in sitting) Postural Control Postural Control: Deficits on evaluation(impaired trunk control)  Balance Balance Balance Assessed: Yes Static Sitting Balance Static Sitting - Level of Assistance: 4: Min assist;3: Mod assist Dynamic Sitting Balance Dynamic Sitting - Level of Assistance: 3: Mod assist;2: Max assist Static Standing Balance Static Standing - Level of Assistance: 1: +2 Total assist Dynamic Standing Balance Dynamic Standing - Level of Assistance: 1: +2 Total assist Extremity Assessment  RLE Assessment RLE Assessment: Within Functional Limits LLE Assessment Passive Range of Motion (PROM) Comments: tight hamstrings, heel cords and hip flexors w/ limitations General Strength Comments: no active muscle contraction felt in L LE    Refer to Care Plan for Long Term Goals  Recommendations for other services: Neuropsych  Discharge Criteria: Patient will be discharged from PT if patient refuses treatment 3 consecutive times without medical reason, if treatment  goals not met, if there is a change in medical status, if patient makes no progress towards goals or if patient is discharged from hospital.  The above assessment, treatment plan, treatment alternatives and goals were discussed and mutually agreed upon: by patient and by family  Netta Corrigan, PT, DPT, CSRS 02/03/2020, 12:33 PM

## 2020-02-03 NOTE — Evaluation (Signed)
Occupational Therapy Assessment and Plan  Patient Details  Name: Nicholas [name] MRN: [id number] Date of Birth: 11/30/1950  OT Diagnosis: abnormal posture, acute pain, apraxia, cognitive deficits, disturbance of vision and hemiplegia affecting non-dominant side Rehab Potential: Rehab Potential (ACUTE ONLY): Good ELOS: ~ 4 weeks   Today's Date: 02/03/2020 OT Individual Time: 1400-1505 OT Individual Time Calculation (min): 65 min     Problem List:  Patient Active Problem List   Diagnosis Date Noted  . Intracranial vasospasm   . Essential hypertension   . Controlled type 2 diabetes mellitus with hyperglycemia ([place])   . Hepatitis   . Abdominal distention   . NSVT (nonsustained ventricular tachycardia) ([place])   . Rate-related bundle branch block   . Acute hypoxemic respiratory failure ([place])   . Aneurysm ([place])   . SAH (subarachnoid hemorrhage) ([place]) 01/14/2020  . Other and unspecified hyperlipidemia 07/17/2013  . Need for prophylactic vaccination and inoculation against influenza 07/17/2013  . Type II or unspecified type diabetes mellitus without mention of complication, not stated as uncontrolled 04/12/2013  . 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 Elevated transaminase measurement 04/12/2013  . Type II or unspecified type diabetes mellitus without mention of complication, uncontrolled 01/05/2013  . Essential hypertension, benign 01/05/2013  . BPH (benign prostatic hyperplasia) 01/05/2013  . Overweight(278.02) 01/05/2013    Past Medical History:  Past Medical History:  Diagnosis Date  . BPH (benign prostatic hypertrophy) 12/20/2011   Dr. Thomasene Mohair  . Colon polyp    Dr. Valli Glance, it was recommended that he have a follow-up colonoscopy in 2010.  Marland Kitchen Deviated septum   . Diabetes mellitus without complication [facility])    Eye exam - 2011  . Hepatitis A   . HSV-1 (herpes simplex virus 1) infection   . Hyperlipidemia   . Hypertension   . Migraine headache    Past Surgical History:  Past Surgical  History:  Procedure Laterality Date  . IR ANGIO EXTERNAL CAROTID SEL EXT CAROTID BILAT MOD SED  01/15/2020  . IR ANGIO INTRA EXTRACRAN SEL INTERNAL CAROTID BILAT MOD SED  01/15/2020  . IR ANGIO INTRA EXTRACRAN SEL INTERNAL CAROTID BILAT MOD SED  01/31/2020  . IR ANGIO VERTEBRAL SEL VERTEBRAL BILAT MOD SED  01/15/2020  . IR ANGIO VERTEBRAL SEL VERTEBRAL BILAT MOD SED  01/31/2020  . IR ENDOVASC INTRACRANIAL INF OTHER THAN THROMBO ART INC DIAG ANGIO  01/18/2020  . IR ENDOVASC INTRACRANIAL INF OTHER THAN THROMBO ART INC DIAG ANGIO EA ADD  01/18/2020  . IR PTA VASOSPASM ADD DIFF  01/18/2020  . IR PTA VASOSPASM INITIAL  01/18/2020  . NASAL SEPTUM SURGERY    . RADIOLOGY WITH ANESTHESIA N/A 01/15/2020   Procedure: IR WITH ANESTHESIA;  Surgeon: Consuella Lose, MD;  Location: [place];  Service: Radiology;  Laterality: N/A;  . RADIOLOGY WITH ANESTHESIA N/A 01/18/2020   Procedure: IR WITH ANESTHESIA;  Surgeon: Consuella Lose, MD;  Location: [place];  Service: Radiology;  Laterality: N/A;    Assessment & Plan Clinical Impression: Patient is a 69 y.o. year old male right-handed male history of hypertension, diabetes mellitus, hepatitis, BPH.  Per chart review patient lives with wife independent prior to admission.  Presented 01/14/2020 with nausea and headache and left-sided weakness.  CT angiogram of head and neck showed a large volume acute subarachnoid hemorrhage.  Large amount of subarachnoid blood seen in the basilar cisterns and extending into the sylvian fissure bilaterally right greater than left.  Interhemispheric subarachnoid hemorrhage present.  Negative for hydrocephalus.  Patient  underwent cerebral angiogram balloon angioplasty of right internal carotid artery right middle cerebral artery and balloon angioplasty of basilar artery.  Keppra initiated for seizure prophylaxis.  Echocardiogram with ejection fraction of 70% with grade 1 diastolic dysfunction.  Maintained on Nimotop  for blood pressure control  and vasospasms times protocol 21 days.  Cardiology services consulted 01/23/2020 for wide-complex tachycardia/hypotension and placed on amiodarone 200 mg daily as well is cleared for aspirin therapy...  Latest follow-up MRI of the brain showed mildly increased left subdural hematoma since 01/19/2020 9 mm in thickness.  Smaller right side subdural hematoma has regressed.  Associated mildly increased rightward midline shift was 7 mm.  Presently patient has been cleared to initiate Lovenox for DVT prophylaxis..  Palliative care consulted for goals of care.  Tolerating a regular diet.   Patient transferred to CIR on 02/02/2020 .    Patient currently requires max to total A with ADLs and functional mobility  secondary to muscle weakness, decreased cardiorespiratoy endurance, impaired timing and sequencing, abnormal tone, unbalanced muscle activation, motor apraxia, decreased coordination and decreased motor planning, decreased visual acuity, decreased visual perceptual skills and field cut, decreased midline orientation and decreased attention to left, decreased initiation, decreased attention, decreased awareness, decreased problem solving, decreased safety awareness, decreased memory and delayed processing and decreased sitting balance, decreased standing balance, decreased postural control, hemiplegia and decreased balance strategies.  Prior to hospitalization, patient could complete ADL with independent .  Patient will benefit from skilled intervention to decrease level of assist with basic self-care skills and increase independence with basic self-care skills prior to discharge home with care partner.  Anticipate patient will require 24 hour supervision and min to moderate Assistance and follow up home health.  OT - End of Session Activity Tolerance: Tolerates 30+ min activity with multiple rests Endurance Deficit: Yes OT Assessment Rehab Potential (ACUTE ONLY): Good OT Patient demonstrates impairments in the  following area(s): Balance;Perception;Behavior;Safety;Cognition;Sensory;Edema;Skin Integrity;Endurance;Vision;Motor;Pain;Nutrition OT Basic ADL's Functional Problem(s): Eating;Grooming;Bathing;Dressing;Toileting OT Transfers Functional Problem(s): Toilet;Tub/Shower OT Additional Impairment(s): Fuctional Use of Upper Extremity OT Plan OT Intensity: Minimum of 1-2 x/day, 45 to 90 minutes OT Frequency: 5 out of 7 days OT Duration/Estimated Length of Stay: ~ 4 weeks OT Treatment/Interventions: Balance/vestibular training;Discharge planning;Functional electrical stimulation;Pain management;UE/LE Coordination activities;Visual/perceptual remediation/compensation;Therapeutic Exercise;Skin care/wound managment;Self Care/advanced ADL retraining;Therapeutic Activities;Patient/family education;Functional mobility training;Disease mangement/prevention;Cognitive remediation/compensation;Community reintegration;DME/adaptive equipment instruction;Neuromuscular re-education;Psychosocial support;Splinting/orthotics;UE/LE Strength taining/ROM;Wheelchair propulsion/positioning OT Basic Self-Care Anticipated Outcome(s): min A OT Toileting Anticipated Outcome(s): mod A OT Bathroom Transfers Anticipated Outcome(s): min A OT Recommendation Recommendations for Other Services: Neuropsych consult Patient destination: Home Follow Up Recommendations: 24 hour supervision/assistance;Home health OT Equipment Recommended: To be determined   Skilled Therapeutic Intervention OT eval initiated with OTpurpose, role and goals discussed with pt, pt's wife and pt's son.   Pt received it the w/c. SElf care retraining at sink level. Pt does present with slow processing of commands and social conversation. Also at times seems to be internally distracted. Pt required extra time for all tasks. Pt requires max to total multimodal cues for visual attention to the left visual field and to the left side of his body. Pt  Does present with  apraxia noted with dressing UB. Discussed and introduced hemi dressing techniques. Pt did perform 3 sit to stands at the sink with max A +2 with each sit to stand improving upright posture with mirror for visual feedback. Decr mm activity in left LE (mild activation in the glut but difficulty to detect- left knee remained completely blocked  for facilitation and support. Introduced and executed [employer] transfer chair to bed (going towards his right side with total multimodal cues and more than reasonable amt of time with max A. At EOB pt required max a to maintain static sitting balance. Pt returned to supine with +2 A. Pt left resting in the bed.  Agreed PT on PRAFOs for left LE.   OT Evaluation Precautions/Restrictions  Precautions Precautions: None Precaution Comments: Lt hemiparesis with Lt neglect ; watch BP Restrictions Weight Bearing Restrictions: No Other Position/Activity Restrictions: keep BP >160 General Chart Reviewed: Yes Family/Caregiver Present: Yes(wife and son) Vital Signs Therapy Vitals Temp: 99.1 F (37.3 C) Temp Source: Oral Pulse Rate: 89 Resp: 18 BP: (!) 155/78 Patient Position (if appropriate): Lying Oxygen Therapy SpO2: 97 % O2 Device: Room Air Pain C/o pain in calves- with mobility and repositioning pt reports no pain. Pt and family does report lower back pain prior to admit and was present. Allowed for rest break as needed  Home Living/Prior [place] expects to be discharged to:: Private residence Living Arrangements: Spouse/significant other, Children Available Help at Discharge: Family, Available 24 hours/day Type of Home: House Home Access: Stairs to enter [employer] of Steps: 2 Entrance Stairs-Rails: None Home Layout: One level Bathroom Shower/Tub: Chiropodist: Standard Additional Comments: information taken per chart review, need to confirm  Lives With: Spouse Prior  Function Level of Independence: Independent with basic ADLs, Independent with gait  Able to Take Stairs?: Yes Driving: Yes Vocation: Full time employment Comments: working as Scientist, research (physical sciences) ADL ADL Eating: Maximal assistance Grooming: Dependent Upper Body Bathing: Maximal assistance Where Assessed-Upper Body Bathing: Sitting at sink Lower Body Bathing: Dependent Where Assessed-Lower Body Bathing: Sitting at sink Upper Body Dressing: Maximal assistance Where Assessed-Upper Body Dressing: Sitting at sink Lower Body Dressing: Dependent(+2) Where Assessed-Lower Body Dressing: Sitting at sink Toileting: Not assessed Toilet Transfer: Not assessed [employer] Transfer: Not assessed Vision Baseline Vision/History: Wears glasses Wears Glasses: At all times Patient Visual Report: Blurring of vision Vision Assessment?: Yes Eye Alignment: Impaired (comment) Alignment/Gaze Preference: Gaze right Tracking/Visual Pursuits: Decreased smoothness of horizontal tracking;Decreased smoothness of vertical tracking;Other (comment)(keeps right eye closed 90% of the time) Saccades: Additional eye shifts occurred during testing;Decreased speed of saccadic movement Convergence: Impaired (comment) Additional Comments: left field cut seems to be present/ alot of additional head movements with both eyes open Perception  Perception: Impaired Inattention/Neglect: Does not attend to left side of body;Does not attend to left visual field Praxis Praxis: Impaired Praxis Impairment Details: Motor planning;Initiation Cognition Overall Cognitive Status: Impaired/Different from baseline Arousal/Alertness: Awake/alert Orientation Level: Person;Place;Situation Person: Oriented Place: Oriented Situation: Disoriented Year: 2021 Month: June Day of Week: Incorrect(gave no answer) Memory: Impaired Memory Impairment: Storage deficit;Retrieval deficit;Decreased recall of new information Immediate Memory Recall: (no  response - delayed processing ; decr attention to request) Memory Recall Sock: Not able to recall Memory Recall Blue: Not able to recall Memory Recall Bed: Not able to recall Attention: Focused;Sustained Focused Attention: Appears intact Sustained Attention: Impaired Sustained Attention Impairment: Functional basic;Verbal basic Selective Attention: Impaired Selective Attention Impairment: Verbal basic Awareness: Impaired Awareness Impairment: Intellectual impairment Problem Solving: Impaired Problem Solving Impairment: Functional basic Executive Function: (all impaired due to lower level impairements) Safety/Judgment: Impaired Comments: slow processing Sensation Sensation Light Touch: Impaired Detail Light Touch Impaired Details: Absent LLE;Impaired LUE Hot/Cold: Impaired by gross assessment Proprioception: Impaired Detail Proprioception Impaired Details: Impaired LUE;Absent LLE Coordination Gross Motor Movements are Fluid and Coordinated: No Fine  Motor Movements are Fluid and Coordinated: No Coordination and Movement Description: coordination impaired secondary to hemiparesis Finger Nose Finger Test: impaired Heel Shin Test: unable Motor  Motor Motor: Hemiplegia;Abnormal tone;Abnormal postural alignment and control Motor - Skilled Clinical Observations: L hemiparesis LE>UE Mobility  Bed Mobility Rolling Right: 2 Helpers Rolling Left: 2 Helpers Supine to Sit: 2 Helpers Sit to Supine: 2 Helpers Transfers Sit to Stand: 2 Helpers  Slide board transfers with +2   Trunk/Postural Assessment  Cervical Assessment Cervical Assessment: (certival flexion and head rests to the right) Thoracic Assessment Thoracic Assessment: (flexed to the left - elonged onthe right ; forward flexed) Lumbar Assessment Lumbar Assessment: (posterior pelvic tilt) Postural Control Postural Control: Deficits on evaluation Trunk Control: poor; falls forward and to the left; also pushes toward the  left Righting Reactions: delayed/ absent Protective Responses: absent  Balance Static Sitting Balance Static Sitting - Level of Assistance: 3: Mod assist Dynamic Sitting Balance Dynamic Sitting - Level of Assistance: 2: Max assist Static Standing Balance Static Standing - Level of Assistance: 1: +2 Total assist Dynamic Standing Balance Dynamic Standing - Level of Assistance: (unable) Extremity/Trunk Assessment RUE Assessment RUE Assessment: Within Functional Limits General Strength Comments: apraxia noted with dressing iwth right UE - holds on to things and not let go LUE Assessment LUE Assessment: Exceptions to [facility] LUE Body System: Neuro Brunstrum levels for arm and hand: Arm;Hand Brunstrum level for arm: Stage V Relative Independence from Synergy Brunstrum level for hand: Stage IV Movements deviating from synergies LUE Tone LUE Tone: Mild     Refer to Care Plan for Long Term Goals  Recommendations for other services: Neuropsych   Discharge Criteria: Patient will be discharged from OT if patient refuses treatment 3 consecutive times without medical reason, if treatment goals not met, if there is a change in medical status, if patient makes no progress towards goals or if patient is discharged from hospital.  The above assessment, treatment plan, treatment alternatives and goals were discussed and mutually agreed upon: by patient and by family  Nicoletta Ba 02/03/2020, 5:15 PM

## 2020-02-04 ENCOUNTER — Inpatient Hospital Stay (HOSPITAL_COMMUNITY): Payer: BC Managed Care – PPO | Admitting: Occupational Therapy

## 2020-02-04 ENCOUNTER — Inpatient Hospital Stay (HOSPITAL_COMMUNITY): Payer: BC Managed Care – PPO | Admitting: Physical Therapy

## 2020-02-04 ENCOUNTER — Inpatient Hospital Stay (HOSPITAL_COMMUNITY): Payer: BC Managed Care – PPO | Admitting: Speech Pathology

## 2020-02-04 DIAGNOSIS — D62 Acute posthemorrhagic anemia: Secondary | ICD-10-CM

## 2020-02-04 DIAGNOSIS — I169 Hypertensive crisis, unspecified: Secondary | ICD-10-CM

## 2020-02-04 DIAGNOSIS — E1165 Type 2 diabetes mellitus with hyperglycemia: Secondary | ICD-10-CM

## 2020-02-04 DIAGNOSIS — R7401 Elevation of levels of liver transaminase levels: Secondary | ICD-10-CM

## 2020-02-04 DIAGNOSIS — I472 Ventricular tachycardia: Secondary | ICD-10-CM

## 2020-02-04 DIAGNOSIS — R000 Tachycardia, unspecified: Secondary | ICD-10-CM

## 2020-02-04 DIAGNOSIS — I10 Essential (primary) hypertension: Secondary | ICD-10-CM

## 2020-02-04 DIAGNOSIS — E871 Hypo-osmolality and hyponatremia: Secondary | ICD-10-CM

## 2020-02-04 DIAGNOSIS — S065XAA Traumatic subdural hemorrhage with loss of consciousness status unknown, initial encounter: Secondary | ICD-10-CM

## 2020-02-04 DIAGNOSIS — Z794 Long term (current) use of insulin: Secondary | ICD-10-CM

## 2020-02-04 DIAGNOSIS — S065X9A Traumatic subdural hemorrhage with loss of consciousness of unspecified duration, initial encounter: Secondary | ICD-10-CM

## 2020-02-04 LAB — COMPREHENSIVE METABOLIC PANEL
ALT: 76 U/L — ABNORMAL HIGH (ref 0–44)
AST: 26 U/L (ref 15–41)
Albumin: 2.9 g/dL — ABNORMAL LOW (ref 3.5–5.0)
Alkaline Phosphatase: 223 U/L — ABNORMAL HIGH (ref 38–126)
Anion gap: 12 (ref 5–15)
BUN: 12 mg/dL (ref 8–23)
CO2: 24 mmol/L (ref 22–32)
Calcium: 8.8 mg/dL — ABNORMAL LOW (ref 8.9–10.3)
Chloride: 98 mmol/L (ref 98–111)
Creatinine, Ser: 0.6 mg/dL — ABNORMAL LOW (ref 0.61–1.24)
GFR calc Af Amer: >60 mL/min (ref >60)
GFR calc non Af Amer: >60 mL/min (ref >60)
Glucose, Bld: 129 mg/dL — ABNORMAL HIGH (ref 70–99)
Potassium: 3.7 mmol/L (ref 3.5–5.1)
Sodium: 134 mmol/L — ABNORMAL LOW (ref 135–145)
Total Bilirubin: 0.7 mg/dL (ref 0.3–1.2)
Total Protein: 6.9 g/dL (ref 6.5–8.1)

## 2020-02-04 LAB — CBC WITH DIFFERENTIAL/PLATELET
Abs Immature Granulocytes: 0.04 10*3/uL (ref 0.00–0.07)
Basophils Absolute: 0 10*3/uL (ref 0.0–0.1)
Basophils Relative: 0 %
Eosinophils Absolute: 0 10*3/uL (ref 0.0–0.5)
Eosinophils Relative: 1 %
HCT: 34.1 % — ABNORMAL LOW (ref 39.0–52.0)
Hemoglobin: 11.5 g/dL — ABNORMAL LOW (ref 13.0–17.0)
Immature Granulocytes: 1 %
Lymphocytes Relative: 11 %
Lymphs Abs: 1 10*3/uL (ref 0.7–4.0)
MCH: 29.4 pg (ref 26.0–34.0)
MCHC: 33.7 g/dL (ref 30.0–36.0)
MCV: 87.2 fL (ref 80.0–100.0)
Monocytes Absolute: 0.7 10*3/uL (ref 0.1–1.0)
Monocytes Relative: 8 %
Neutro Abs: 7 10*3/uL (ref 1.7–7.7)
Neutrophils Relative %: 79 %
Platelets: 466 10*3/uL — ABNORMAL HIGH (ref 150–400)
RBC: 3.91 MIL/uL — ABNORMAL LOW (ref 4.22–5.81)
RDW: 14.3 % (ref 11.5–15.5)
WBC: 8.8 10*3/uL (ref 4.0–10.5)
nRBC: 0 % (ref 0.0–0.2)

## 2020-02-04 LAB — GLUCOSE, CAPILLARY
Glucose-Capillary: 119 mg/dL — ABNORMAL HIGH (ref 70–99)
Glucose-Capillary: 157 mg/dL — ABNORMAL HIGH (ref 70–99)
Glucose-Capillary: 187 mg/dL — ABNORMAL HIGH (ref 70–99)
Glucose-Capillary: 199 mg/dL — ABNORMAL HIGH (ref 70–99)

## 2020-02-04 MED ORDER — HYDRALAZINE HCL 25 MG PO TABS
25.0000 mg | ORAL_TABLET | Freq: Three times a day (TID) | ORAL | Status: DC | PRN
  Administered 2020-02-04 – 2020-03-07 (×2): 25 mg via ORAL
  Filled 2020-02-04 (×2): qty 1

## 2020-02-04 MED ORDER — LISINOPRIL 5 MG PO TABS: 5.0000 mg | ORAL_TABLET | Freq: Every day | ORAL | Status: DC

## 2020-02-04 MED ORDER — BACLOFEN 5 MG HALF TABLET
5.0000 mg | ORAL_TABLET | Freq: Three times a day (TID) | ORAL | Status: DC
  Administered 2020-02-04 – 2020-02-07 (×10): 5 mg via ORAL
  Filled 2020-02-04 (×10): qty 1

## 2020-02-04 MED ORDER — LIVING WELL WITH DIABETES BOOK
Freq: Once | Status: AC
  Filled 2020-02-04: qty 1

## 2020-02-04 MED ORDER — BLOOD PRESSURE CONTROL BOOK
Freq: Once | Status: AC
  Filled 2020-02-04: qty 1

## 2020-02-04 NOTE — Progress Notes (Signed)
Occupational Therapy Session Note  Patient Details  Name: Nicholas [name] MRN: [id number] Date of Birth: 1951-09-08  Today's Date: 02/04/2020 OT Individual Time: 1300-1410 OT Individual Time Calculation (min): 70 min    Short Term Goals: Week 1:  OT Short Term Goal 1 (Week 1): Pt will maintain static sitting balance (unsupported) with min A in prep for dyanamic sititng balance in ADL with OT Short Term Goal 2 (Week 1): Pt will don shirt with mod A OT Short Term Goal 3 (Week 1): Pt will perform transfer of max A +1 with LRAD method OT Short Term Goal 4 (Week 1): PT will perform bed mobility in prep for ADL tasks with max A+1 (rolling/ supine to sit)  Skilled Therapeutic Interventions/Progress Updates:  Session 1: Patient met lying supine in bed in agreement with OT treatment session with focus on self-care re-education, NMR, static sitting balance, and L attention as detailed below. Patient found to be incontinent of bowel requiring +2 assist for rolling R<>L and total A for hygiene/clothing management. Patient with stage I pressure injury to buttocks. RN notified. Application of sacral foam pad. Total A to don LB clothing in supine. Supine to EOB with total A. Patient's son present during treatment session able to provide assistance for maintaining sitting balance with patient requiring Max multimodal cueing and Max-Total A secondary to posterior pushing. With cueing, patient able to correct for short periods of time. UB dressing with Max A and patient/family education on hemi-dressing technique. Return to supine with +2 helpers. OT provided family education on patient CLOF and recommendations for sitting on patient's L to encourage attention to L visual field. Family also aware of increased processing time with recommendations of limiting additional environmental distractions. Session concluded with patient lying supine in bed with call bell within reach, bed alarm activated and all needs met. [name].   Therapy Documentation Precautions:  Precautions Precautions: None Precaution Comments: Lt hemiparesis with Lt neglect ; watch BP Restrictions Weight Bearing Restrictions: No Other Position/Activity Restrictions: keep BP >160   Therapy/Group: Individual Therapy  Ysela Hettinger R [place] 02/04/2020, 7:58 AM

## 2020-02-04 NOTE — Progress Notes (Signed)
On call  Notified secondary to B/P elevation  order received Baclofen 5 mg administered and Apresoline po, Patient denies pain or need for medication, repositioned and monitor closely, Continue sleep chart asleep th majority of shift, easily arousable , continue regime,, reassessed VS refer to VS documentation sheet

## 2020-02-04 NOTE — Care Management (Signed)
Inpatient Rehabilitation Center Individual Statement of Services  Patient Name:  Nicholas [name]  Date:  02/04/2020  Welcome to the Inpatient Rehabilitation Center.  Our goal is to provide you with an individualized program based on your diagnosis and situation, designed to meet your specific needs.  With this comprehensive rehabilitation program, you will be expected to participate in at least 3 hours of rehabilitation therapies Monday-Friday, with modified therapy programming on the weekends.  Your rehabilitation program will include the following services:  Physical Therapy (PT), Occupational Therapy (OT), Speech Therapy (ST), 24 hour per day rehabilitation nursing, Therapeutic Recreaction (TR), Neuropsychology, Care Coordinator, Rehabilitation Medicine, Nutrition Services, Pharmacy Services and Other  Weekly team conferences will be held on Wednesdays to discuss your progress.  Your Inpatient Rehabilitation Care Coordinator will talk with you frequently to get your input and to update you on team discussions.  Team conferences with you and your family in attendance may also be held.  Expected length of stay: 20-24 Days  Overall anticipated outcome: Supervision/Min A  Depending on your progress and recovery, your program may change. Your Inpatient Rehabilitation Care Coordinator will coordinate services and will keep you informed of any changes. Your Inpatient Rehabilitation Care Coordinator's name and contact numbers are listed  below.  The following services may also be recommended but are not provided by the Inpatient Rehabilitation Center:    Home Health Rehabiltiation Services  Outpatient Rehabilitation Services    Arrangements will be made to provide these services after discharge if needed.  Arrangements include referral to agencies that provide these services.  Your insurance has been verified to be:  BCBS Your primary doctor is:  Birdena Jubilee, MD  Pertinent information will be  shared with your doctor and your insurance company.  Inpatient Rehabilitation Care Coordinator:  Lavera Guise, [place] [phone] or [phone]  Information discussed with and copy given to patient by: Andria Rhein, 02/04/2020, 11:16 AM

## 2020-02-04 NOTE — Progress Notes (Signed)
Speech Language Pathology Daily Session Note  Patient Details  Name: Nicholas [name] MRN: [id number] Date of Birth: 1950/12/22  Today's Date: 02/04/2020 SLP Individual Time: 1000-1040 SLP Individual Time Calculation (min): 40 min  Missed Time: 20 minutes, fatigue   Short Term Goals: Week 1: SLP Short Term Goal 1 (Week 1): Patient will participate in further assessment of cognitive skills. SLP Short Term Goal 2 (Week 1): Patient will respond to basic yes/no questions targeting wants/needs in 8 out of 10 opportunities with max A. SLP Short Term Goal 3 (Week 1): Patient will follow 2 step directions with 80% accuracy and max A. SLP Short Term Goal 4 (Week 1): Patient will participate in visual scanning tasks scannning left of midline in 5 of 10 opportunities with max A. SLP Short Term Goal 5 (Week 1): Patient will participate in a sustained attention task for 5 minutes with max A.  Skilled Therapeutic Interventions: Skilled treatment session focused on cognitive goals. SLP facilitated session by providing Max verbal, tactile and visual cues for initiation of self-feeding. Patient initially decline PO intake but then would self-feed ~3 bites at a time prior to stopping. Therefore, recommend patient have full supervision with meals to maximize initiation and overall PO intake. Patient was incontinent of bowel without awareness and required extra time and hand over hand assist to initiate bed mobility. Patient with minimal verbal expression and answered yes/no questions in 50% of opportunities but did not answer any open-ended questions. Patient fell asleep after repositioning from donning a new brief, therefore, patient missed remaining 20 minutes of session. Patient left in bed with alarm on and all needs within reach. Continue with current plan of care.     Pain No/Denies Pain  Therapy/Group: Individual Therapy  Janeane Cozart 02/04/2020, 3:12 PM

## 2020-02-04 NOTE — Progress Notes (Signed)
Patient communicated at intervals and oriented to self only, has short conversations,responds but responds when question is asked about pain appropriately.with flat affect, NT assigned to patient states that patient was communicating with his family but once she and another staff employees entered he completely stop talking, Closes his right eye most of the time but will open it when ask, indicates he can see out of his eye without any problem,.Follow some simple commands, monitor and assisted with repositioning Q2 hrs and prn, mouth care. Call bell placed on right side within reach, bed alarm on, .

## 2020-02-04 NOTE — Progress Notes (Signed)
[place] PHYSICAL MEDICINE & REHABILITATION PROGRESS NOTE   Subjective/Complaints: Patient seen sitting up in bed this morning.  Overnight poorly controlled elevated blood pressure.  ROS: Unreliable due to cognition  Objective:   No results found. Recent Labs    02/02/20 1629 02/04/20 0451  WBC 11.6* 8.8  HGB 10.6* 11.5*  HCT 32.4* 34.1*  PLT 416* 466*   Recent Labs    02/02/20 1629 02/04/20 0451  NA  --  134*  K  --  3.7  CL  --  98  CO2  --  24  GLUCOSE  --  129*  BUN  --  12  CREATININE 0.62 0.60*  CALCIUM  --  8.8*    Intake/Output Summary (Last 24 hours) at 02/04/2020 0850 Last data filed at 02/04/2020 0435 Gross per 24 hour  Intake --  Output 950 ml  Net -950 ml     Physical Exam: Vital Signs Blood pressure (!) 145/63, pulse 89, temperature 97.7 F (36.5 C), temperature source Oral, resp. rate 18, height 6' (1.829 m), weight 89.8 kg, SpO2 97 %. Constitutional: No distress . Vital signs reviewed. HENT: Normocephalic.  Atraumatic. Eyes: Keeps her eyes closed.  No discharge. Cardiovascular: No JVD. Respiratory: Normal effort.  No stridor. GI: Non-distended. Skin: Warm and dry.  Intact. Psych: Unable to assess due to mentation Musc: Left upper extremity edema Neurological:  Left facial droop. Inconsistently follows commands Motor: Limited due to ability to follow commands and attention  spontaneously moving all extremities Right ptosis MAS 1+ LUE EF  Assessment/Plan: 1. Functional deficits secondary to [facility] which require 3+ hours per day of interdisciplinary therapy in a comprehensive inpatient rehab setting.  Physiatrist is providing close team supervision and 24 hour management of active medical problems listed below.  Physiatrist and rehab team continue to assess barriers to discharge/monitor patient progress toward functional and medical goals  Care Tool:  Bathing    Body parts bathed by patient: Left arm, Face   Body parts bathed by  helper: Right arm, Chest, Abdomen, Front perineal area, Buttocks, Right upper leg, Left upper leg, Right lower leg, Left lower leg     Bathing assist Assist Level: Maximal Assistance - Patient 24 - 49%     Upper Body Dressing/Undressing Upper body dressing   What is the patient wearing?: Hospital gown only    Upper body assist Assist Level: Maximal Assistance - Patient 25 - 49%    Lower Body Dressing/Undressing Lower body dressing      What is the patient wearing?: Incontinence brief     Lower body assist Assist for lower body dressing: Total Assistance - Patient < 25%     Toileting Toileting    Toileting assist Assist for toileting: 2 Helpers     Transfers Chair/bed transfer  Transfers assist     Chair/bed transfer assist level: 2 Helpers     Locomotion Ambulation   Ambulation assist   Ambulation activity did not occur: Safety/medical concerns          Walk 10 feet activity   Assist  Walk 10 feet activity did not occur: Safety/medical concerns        Walk 50 feet activity   Assist Walk 50 feet with 2 turns activity did not occur: Safety/medical concerns         Walk 150 feet activity   Assist Walk 150 feet activity did not occur: Safety/medical concerns         Walk 10 feet on uneven  surface  activity   Assist Walk 10 feet on uneven surfaces activity did not occur: Safety/medical concerns         Wheelchair     Assist Will patient use wheelchair at discharge?: Yes Type of Wheelchair: Manual    Wheelchair assist level: Dependent - Patient 0% Max wheelchair distance: 150 ft    Wheelchair 50 feet with 2 turns activity    Assist        Assist Level: Dependent - Patient 0%   Wheelchair 150 feet activity     Assist      Assist Level: Dependent - Patient 0%   Blood pressure (!) 145/63, pulse 89, temperature 97.7 F (36.5 C), temperature source Oral, resp. rate 18, height 6' (1.829 m), weight 89.8 kg,  SpO2 97 %.    Medical Problem List and Plan: 1.  Left-sided weakness secondary to left greater than right subdural hematoma.  Status post cerebral angiogram balloon angioplasty  Continue CIR  Therapy eval's reviewed-requiring significant assistance from all disciplines, will likely continue to require min/mod assistance from all disciplines at discharge. 2.  Antithrombotics: -DVT/anticoagulation: Lovenox initiated 01/29/2020             -antiplatelet therapy: Aspirin 325 mg daily 3. Pain Management: Oxycodone as needed, Robaxin as needed 4. Mood: Provide emotional support             -antipsychotic agents: N/A  Started Zoloft daily.  5. Neuropsych: This patient is not capable of making decisions on his own behalf. 6. Skin/Wound Care: Routine skin checks 7. Fluids/Electrolytes/Nutrition: Routine in and outs   Carb modified diet-limited p.o. intake.  8.  Diabetes mellitus with hyperglycemia.  Hemoglobin A1c 7.7.  Check blood sugars before meals and at bedtime.  Patient on Tresiba 35 units daily, Glucotrol 10 mg daily, Actos 45 mg daily, Janumet 50-1000 mg twice daily, NovoLog 75 units daily prior to admission.               Levemir 31U BID  Labile on 5/17 9.  Seizure prophylaxis. Keppra 500 mg twice daily 10.  Arrhythmia/wide-complex tachycardia.  Amiodarone 200 mg daily x4 weeks (~6/12).  Follow-up cardiology services 11.  Hypertension.  Autoregulate on Nimotop.  Lisinopril 5 started on 5/17  Extremely elevated on 5/17 with hypertensive crisis 12.  Hyperlipidemia.  Crestor/fenofibrate 13. Mild vasospasm evident on 5/14 vascular transcranial doppler. 14. Spasticity:  Started baclofen 5mg  TID for pain relief and spasticity.  15.  Hyponatremia  Sodium 134 on 5/17  Continue to monitor 16.  Transaminitis  ALT elevated on 5/17  Continue to monitor 17.  Acute blood loss anemia  Hemoglobin 11.5 on 5/17  Continue to monitor   LOS: 2 days A FACE TO FACE EVALUATION WAS  PERFORMED  Nicholas [name] 6/17 02/04/2020, 8:50 AM

## 2020-02-04 NOTE — IPOC Note (Signed)
Individualized overall Plan of Care (IPOC) Patient Details Name: Nicholas [name] MRN: [id number] DOB: [birth year]  Admitting Diagnosis: SDH (subdural hematoma) [facility])  Hospital Problems: Principal Problem:   SDH (subdural hematoma) ([place]) Active Problems:   SAH (subarachnoid hemorrhage) ([facility])   Acute blood loss anemia   Hypertensive crisis   Hyponatremia   Transaminitis   Wide-complex tachycardia ([place])     Functional Problem List: Nursing Bladder, Bowel, Endurance, Medication Management, Motor, Nutrition, Safety, Sensory, Pain  PT Balance, Behavior, Safety, Sensory, Edema, Endurance, Motor, Pain, Nutrition  OT Balance, Perception, Behavior, Safety, Cognition, Sensory, Edema, Skin Integrity, Endurance, Vision, Motor, Pain, Nutrition  SLP Linguistic, Cognition  TR         Basic ADL's: OT Eating, Grooming, Bathing, Dressing, Toileting     Advanced  ADL's: OT       Transfers: PT Bed Mobility, Bed to Chair, Car, Manufacturing systems engineer, Metallurgist: PT Emergency planning/management officer, Ambulation, Stairs     Additional Impairments: OT Fuctional Use of Upper Extremity  SLP None      TR      Anticipated Outcomes Item Anticipated Outcome  Self Feeding    Swallowing      Basic self-care  min A  Toileting  mod A   Bathroom Transfers min A  Bowel/Bladder  Be continent of bowel/bladder by the time of d/c  Transfers  min assist  Locomotion  mod assist  Communication  Min A  Cognition  Min A  Pain  >2  Safety/Judgment  Able to call for help at the time of d/c   Therapy Plan: PT Intensity: Minimum of 1-2 x/day ,45 to 90 minutes PT Frequency: 5 out of 7 days PT Duration Estimated Length of Stay: 4 weeks OT Intensity: Minimum of 1-2 x/day, 45 to 90 minutes OT Frequency: 5 out of 7 days OT Duration/Estimated Length of Stay: ~ 4 weeks SLP Intensity: Minumum of 1-2 x/day, 30 to 90 minutes SLP Frequency: 3 to 5 out of 7 days SLP Duration/Estimated Length of Stay:  21-24 days    Team Interventions: Nursing Interventions Patient/Family Education, Pain Management, Bladder Management, Medication Management, Bowel Management, Disease Management/Prevention, Discharge Planning  PT interventions Ambulation/gait training, Neuromuscular re-education, DME/adaptive equipment instruction, Community reintegration, Psychosocial support, Stair training, UE/LE Strength taining/ROM, Wheelchair propulsion/positioning, UE/LE Coordination activities, Therapeutic Activities, Skin care/wound management, Pain management, Functional electrical stimulation, Discharge planning, Training and development officer, Cognitive remediation/compensation, Disease management/prevention, Functional mobility training, Patient/family education, Splinting/orthotics, Therapeutic Exercise, Visual/perceptual remediation/compensation  OT Interventions Balance/vestibular training, Discharge planning, Functional electrical stimulation, Pain management, UE/LE Coordination activities, Visual/perceptual remediation/compensation, Therapeutic Exercise, Skin care/wound managment, Self Care/advanced ADL retraining, Therapeutic Activities, Patient/family education, Functional mobility training, Disease mangement/prevention, Cognitive remediation/compensation, Academic librarian, Engineer, drilling, Neuromuscular re-education, Psychosocial support, Splinting/orthotics, UE/LE Strength taining/ROM, Wheelchair propulsion/positioning  SLP Interventions Cognitive remediation/compensation, Speech/Language facilitation, Cueing hierarchy, Functional tasks, Internal/external aids, Patient/family education, Therapeutic Exercise, Therapeutic Activities  TR Interventions    SW/CM Interventions     Barriers to Discharge MD  Medical stability, Incontinence, Neurogenic bowel and bladder and Behavior  Nursing      PT Home environment access/layout, Behavior    OT      SLP      SW       Team Discharge  Planning: Destination: PT-Home ,OT- Home , SLP-Home Projected Follow-up: PT-Home health PT, OT-  24 hour supervision/assistance, Home health OT, SLP-Home Health SLP Projected Equipment Needs: PT-To be determined, OT- To be determined, SLP-None recommended by SLP  Equipment Details: PT- , OT-  Patient/family involved in discharge planning: PT- Patient, Family member/caregiver,  OT-Patient, Family member/caregiver, SLP-Patient unable/family or caregive not available  MD ELOS: 24-27 days. Medical Rehab Prognosis:  Good Assessment: Right-handed male history of hypertension, diabetes mellitus, hepatitis, BPH.  Presented 01/14/2020 with nausea and headache and left-sided weakness.  CT angiogram of head and neck showed a large volume acute subarachnoid hemorrhage.  Large amount of subarachnoid blood seen in the basilar cisterns and extending into the sylvian fissure bilaterally right greater than left.  Interhemispheric subarachnoid hemorrhage present.  Negative for hydrocephalus.  Patient underwent cerebral angiogram balloon angioplasty of right internal carotid artery right middle cerebral artery and balloon angioplasty of basilar artery.  Keppra initiated for seizure prophylaxis.  Echocardiogram with ejection fraction of 70% with grade 1 diastolic dysfunction.  Maintained on Nimotop  for blood pressure control and vasospasms times protocol 21 days.  Cardiology services consulted 01/23/2020 for wide-complex tachycardia/hypotension and placed on amiodarone 200 mg daily as well is cleared for aspirin therapy...  Latest follow-up MRI of the brain showed mildly increased left subdural hematoma since 01/19/2020 9 mm in thickness.  Smaller right side subdural hematoma has regressed.  Associated mildly increased rightward midline shift was 7 mm.  Presently patient has been cleared to initiate Lovenox for DVT prophylaxis.  Palliative care consulted for goals of care.  Patient with resulting functional deficits with mobility,  transfers, balance, cognition. Will set goals for Min/Mod A with PT/OT/SLP.  Due to the current state of emergency, patients may not be receiving their 3-hours of Medicare-mandated therapy.  See Team Conference Notes for weekly updates to the plan of care

## 2020-02-04 NOTE — Progress Notes (Signed)
Physical Therapy Session Note  Patient Details  Name: Nicholas [name] MRN: [id number] Date of Birth: 1951/09/06  Today's Date: 02/04/2020 PT Individual Time: 0800-0856 PT Individual Time Calculation (min): 56 min   Short Term Goals: Week 1:  PT Short Term Goal 1 (Week 1): Pt will perform bed<>chair transfer max +2 without lift equipment PT Short Term Goal 2 (Week 1): Pt will perform sit<>stand with max assist +2 PT Short Term Goal 3 (Week 1): Pt will maintain sitting balance statically EOB with CGA x 5 minutes  Skilled Therapeutic Interventions/Progress Updates:    Patient received in bed, willing to work with therapy but keeping eyes closed for most of session despite cues from therapist. Continues to require MaxAx2 for all functional bed mobility and for bed<->chair sliding board transfers with max cues for forward lean and head/hips relationship. Note BP in 130s/70s in supine with and without knee high TEDS, after transfer to the chair found BP to be 93/56 then 101/57 however RN requested that PT return him to bed due to low pressures. Also worked on cervical rotation to the left with significant difficulty noted in attending to L side and with persistent R gaze noted as well. Able to roll in bed with maxAx2 and use of bed features. Requested clarification of BP parameters with MD via Epic secure messaging, awaiting response. Patient left in bed with all needs met, bed alarm active this morning.   Therapy Documentation Precautions:  Precautions Precautions: None Precaution Comments: Lt hemiparesis with Lt neglect ; watch BP Restrictions Weight Bearing Restrictions: No Other Position/Activity Restrictions: keep BP >160   Pain: Pain Assessment Pain Scale: Faces Faces Pain Scale: Hurts even more Pain Location: Leg Pain Orientation: Left Pain Descriptors / Indicators: Aching;Sore Pain Onset: On-going Patients Stated Pain Goal: 0 Pain Intervention(s): Repositioned;Distraction Multiple  Pain Sites: No    Therapy/Group: Individual Therapy   Windell Norfolk, DPT, PN1   Supplemental Physical Therapist [place]    Pager [phone] Acute Rehab Office [phone]    02/04/2020, 12:26 PM

## 2020-02-04 NOTE — Progress Notes (Signed)
Inpatient Rehabilitation Care Coordinator Assessment and Plan Inpatient Rehabilitation Care Coordinator Assessment and Plan  Patient Details  Name: Nicholas [name] MRN: [id number] Date of Birth: 1951/01/28  Today's Date: 02/04/2020  Problem List:  Patient Active Problem List   Diagnosis Date Noted  . Acute blood loss anemia   . Hypertensive crisis   . Hyponatremia   . SDH (subdural hematoma) (Nicholas [name])   . Transaminitis   . Wide-complex tachycardia (Nicholas [name])   . Intracranial vasospasm   . Essential hypertension   . Controlled type 2 diabetes mellitus with hyperglycemia (Nicholas [name])   . Hepatitis   . Abdominal distention   . NSVT (nonsustained ventricular tachycardia) (Nicholas [name])   . Rate-related bundle branch block   . Acute hypoxemic respiratory failure (Nicholas [name])   . Aneurysm (Nicholas [name])   . SAH (subarachnoid hemorrhage) (Nicholas [name]) 01/14/2020  . Other and unspecified hyperlipidemia 07/17/2013  . Need for prophylactic vaccination and inoculation against influenza 07/17/2013  . Type II or unspecified type diabetes mellitus without mention of complication, not stated as uncontrolled 04/12/2013  . 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 Elevated transaminase measurement 04/12/2013  . Type II or unspecified type diabetes mellitus without mention of complication, uncontrolled 01/05/2013  . Essential hypertension, benign 01/05/2013  . BPH (benign prostatic hyperplasia) 01/05/2013  . Overweight(278.02) 01/05/2013   Past Medical History:  Past Medical History:  Diagnosis Date  . BPH (benign prostatic hypertrophy) 12/20/2011   Dr. Thomasene [name]  . Colon polyp    Dr. Valli [name], it was recommended that he have a follow-up colonoscopy in 2010.  Marland Kitchen Deviated septum   . Diabetes mellitus without complication [facility])    Eye exam - 2011  . Hepatitis A   . HSV-1 (herpes simplex virus 1) infection   . Hyperlipidemia   . Hypertension   . Migraine headache    Past Surgical History:  Past Surgical History:  Procedure Laterality Date  . IR ANGIO  EXTERNAL CAROTID SEL EXT CAROTID BILAT MOD SED  01/15/2020  . IR ANGIO INTRA EXTRACRAN SEL INTERNAL CAROTID BILAT MOD SED  01/15/2020  . IR ANGIO INTRA EXTRACRAN SEL INTERNAL CAROTID BILAT MOD SED  01/31/2020  . IR ANGIO VERTEBRAL SEL VERTEBRAL BILAT MOD SED  01/15/2020  . IR ANGIO VERTEBRAL SEL VERTEBRAL BILAT MOD SED  01/31/2020  . IR ENDOVASC INTRACRANIAL INF OTHER THAN THROMBO ART INC DIAG ANGIO  01/18/2020  . IR ENDOVASC INTRACRANIAL INF OTHER THAN THROMBO ART INC DIAG ANGIO EA ADD  01/18/2020  . IR PTA VASOSPASM ADD DIFF  01/18/2020  . IR PTA VASOSPASM INITIAL  01/18/2020  . NASAL SEPTUM SURGERY    . RADIOLOGY WITH ANESTHESIA N/A 01/15/2020   Procedure: IR WITH ANESTHESIA;  Surgeon: Nicholas Lose, MD;  Location: [name];  Service: Radiology;  Laterality: N/A;  . RADIOLOGY WITH ANESTHESIA N/A 01/18/2020   Procedure: IR WITH ANESTHESIA;  Surgeon: Nicholas Lose, MD;  Location: [name];  Service: Radiology;  Laterality: N/A;   Social History:  reports that he has never smoked. He has never used smokeless tobacco. He reports current alcohol use. He reports that he does not use drugs.  Family / Support Systems Marital Status: Married Patient Roles: Spouse Spouse/Significant Other: Nicholas [name] Children: 3 Children. 2 Sons (in town), 1 daughter (out of town) Other Supports: Familt Friend: Nicholas [name] Anticipated Caregiver: Sons and spouse Ability/Limitations of Caregiver: spouse has knee surgey in past. Can assit, not lift Caregiver Availability: 24/7  Social History Preferred language: English Religion:  Cultural Background: Dance movement psychotherapist Read: Yes Write:  Yes   Abuse/Neglect Abuse/Neglect Assessment Can Be Completed: Unable to assess, patient is non-responsive or altered mental status Physical Abuse: Denies Verbal Abuse: Denies Sexual Abuse: Denies Exploitation of patient/patient's resources: Denies Self-Neglect: Denies  Emotional Status Pt's affect, behavior and adjustment status: Son  states he feel like dad is depressed. SW making referral to neuro, waiting for son to pick a day to participate with spouse Recent Psychosocial Issues: no Psychiatric History: no Substance Abuse History: no  Patient / Family Perceptions, Expectations & Goals Pt/Family understanding of illness & functional limitations: yes Pt/family expectations/goals: goal currently is to discharge home  [employer]: None Premorbid Home Care/DME Agencies: None Transportation available at discharge: family able to transport Resource referrals recommended: Neuropsychology  Discharge Planning Living Arrangements: Spouse/significant other Support Systems: Spouse/significant other, Children, Friends/neighbors Type of Residence: Private residence(1 Level home. 2 steps to enter, no rails) Insurance Resources: Media planner (specify)(BCBS) Money Management: Spouse, Patient Does the patient have any problems obtaining your medications?: No Care Coordinator Barriers to Discharge: Medical stability Care Coordinator Anticipated Follow Up Needs: HH/OP Expected length of stay: 20-24 Days  Clinical Impression SW entered room. Son at bedside. SW introduced self, explained role and process. Son voice that he feels patient is depressed. 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 SW will refer to neuro once son confirms date that will work for patient and mom to participate. SW addressed all questions and concerns. SW will continue to follow up with family.   Andria Rhein 02/04/2020, 12:54 PM

## 2020-02-04 NOTE — Progress Notes (Deleted)
Inpatient Rehabilitation Care Coordinator Assessment and Plan Inpatient Rehabilitation Care Coordinator Assessment and Plan  Patient Details  Name: Nicholas [name] MRN: [id number] Date of Birth: Feb 05, 1951  Today's Date: 02/04/2020  Problem List:  Patient Active Problem List   Diagnosis Date Noted  . Acute blood loss anemia   . Hypertensive crisis   . Hyponatremia   . SDH (subdural hematoma) ([place])   . Transaminitis   . Wide-complex tachycardia ([place])   . Intracranial vasospasm   . Essential hypertension   . Controlled type 2 diabetes mellitus with hyperglycemia ([place])   . Hepatitis   . Abdominal distention   . NSVT (nonsustained ventricular tachycardia) ([place])   . Rate-related bundle branch block   . Acute hypoxemic respiratory failure ([place])   . Aneurysm ([place])   . SAH (subarachnoid hemorrhage) ([place]) 01/14/2020  . Other and unspecified hyperlipidemia 07/17/2013  . Need for prophylactic vaccination and inoculation against influenza 07/17/2013  . Type II or unspecified type diabetes mellitus without mention of complication, not stated as uncontrolled 04/12/2013  . 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 Elevated transaminase measurement 04/12/2013  . Type II or unspecified type diabetes mellitus without mention of complication, uncontrolled 01/05/2013  . Essential hypertension, benign 01/05/2013  . BPH (benign prostatic hyperplasia) 01/05/2013  . Overweight(278.02) 01/05/2013   Past Medical History:  Past Medical History:  Diagnosis Date  . BPH (benign prostatic hypertrophy) 12/20/2011   Dr. Thomasene Mohair  . Colon polyp    Dr. Valli Glance, it was recommended that he have a follow-up colonoscopy in 2010.  Marland Kitchen Deviated septum   . Diabetes mellitus without complication [facility])    Eye exam - 2011  . Hepatitis A   . HSV-1 (herpes simplex virus 1) infection   . Hyperlipidemia   . Hypertension   . Migraine headache    Past Surgical History:  Past Surgical History:  Procedure Laterality Date  . IR ANGIO  EXTERNAL CAROTID SEL EXT CAROTID BILAT MOD SED  01/15/2020  . IR ANGIO INTRA EXTRACRAN SEL INTERNAL CAROTID BILAT MOD SED  01/15/2020  . IR ANGIO INTRA EXTRACRAN SEL INTERNAL CAROTID BILAT MOD SED  01/31/2020  . IR ANGIO VERTEBRAL SEL VERTEBRAL BILAT MOD SED  01/15/2020  . IR ANGIO VERTEBRAL SEL VERTEBRAL BILAT MOD SED  01/31/2020  . IR ENDOVASC INTRACRANIAL INF OTHER THAN THROMBO ART INC DIAG ANGIO  01/18/2020  . IR ENDOVASC INTRACRANIAL INF OTHER THAN THROMBO ART INC DIAG ANGIO EA ADD  01/18/2020  . IR PTA VASOSPASM ADD DIFF  01/18/2020  . IR PTA VASOSPASM INITIAL  01/18/2020  . NASAL SEPTUM SURGERY    . RADIOLOGY WITH ANESTHESIA N/A 01/15/2020   Procedure: IR WITH ANESTHESIA;  Surgeon: Consuella Lose, MD;  Location: [facility];  Service: Radiology;  Laterality: N/A;  . RADIOLOGY WITH ANESTHESIA N/A 01/18/2020   Procedure: IR WITH ANESTHESIA;  Surgeon: Consuella Lose, MD;  Location: [facility];  Service: Radiology;  Laterality: N/A;   Social History:  reports that he has never smoked. He has never used smokeless tobacco. He reports current alcohol use. He reports that he does not use drugs.  Family / Support Systems Marital Status: Married Patient Roles: Spouse Spouse/Significant Other: Mary Children: 3 Children. Oldest daughter out of town. Brothers local Other Supports: 2, SonsFriend: Journalist, newspaper Anticipated Caregiver: Spouse, Mary Ability/Limitations of Caregiver: Spouse had knee surgey in past  Social History Preferred language: English Religion:  Cultural Background: Dance movement psychotherapist Read: Yes Write: Yes   Abuse/Neglect Abuse/Neglect Assessment Can Be Completed: Yes  Physical Abuse: Denies Verbal Abuse: Denies Sexual Abuse: Denies Exploitation of patient/patient's resources: Denies Self-Neglect: Denies  Emotional Status    Patient / Family Perceptions, Expectations & Goals Pt/Family understanding of illness & functional limitations: yes  Building surveyor:  None Premorbid Home Care/DME Agencies: None Resource referrals recommended: Neuropsychology  Discharge Planning Living Arrangements: Spouse/significant other Support Systems: Spouse/significant other Type of Residence: Private residence(1 Level Home, 2 Steps to enter, no rails.) Insurance Resources: Media planner (specify)(BCBS) Does the patient have any problems obtaining your medications?: No Care Coordinator Barriers to Discharge: Medical stability Care Coordinator Anticipated Follow Up Needs: HH/OP Expected length of stay: 20-24 Days  Clinical Impression SW entered room   Andria Rhein 02/04/2020, 12:48 PM

## 2020-02-04 NOTE — Progress Notes (Signed)
Patient information reviewed and entered into eRehab System by Becky Muhanad Torosyan, PPS coordinator. Information including medical coding, function ability, and quality indicators will be reviewed and updated through discharge.   

## 2020-02-05 ENCOUNTER — Inpatient Hospital Stay (HOSPITAL_COMMUNITY): Payer: BC Managed Care – PPO | Admitting: Occupational Therapy

## 2020-02-05 ENCOUNTER — Inpatient Hospital Stay (HOSPITAL_COMMUNITY): Payer: BC Managed Care – PPO | Admitting: Physical Therapy

## 2020-02-05 ENCOUNTER — Inpatient Hospital Stay (HOSPITAL_COMMUNITY): Payer: BC Managed Care – PPO | Admitting: Speech Pathology

## 2020-02-05 DIAGNOSIS — I951 Orthostatic hypotension: Secondary | ICD-10-CM

## 2020-02-05 DIAGNOSIS — L8990 Pressure ulcer of unspecified site, unspecified stage: Secondary | ICD-10-CM | POA: Insufficient documentation

## 2020-02-05 LAB — GLUCOSE, CAPILLARY
Glucose-Capillary: 98 mg/dL (ref 70–99)
Glucose-Capillary: 170 mg/dL — ABNORMAL HIGH (ref 70–99)
Glucose-Capillary: 135 mg/dL — ABNORMAL HIGH (ref 70–99)
Glucose-Capillary: 271 mg/dL — ABNORMAL HIGH (ref 70–99)

## 2020-02-05 NOTE — Progress Notes (Signed)
Physical Therapy Session Note  Patient Details  Name: Nicholas [name] MRN: [id number] Date of Birth: 13-Jan-1951  Today's Date: 02/05/2020 PT Individual Time: 0804-0900 PT Individual Time Calculation (min): 56 min   Short Term Goals: Week 1:  PT Short Term Goal 1 (Week 1): Pt will perform bed<>chair transfer max +2 without lift equipment PT Short Term Goal 2 (Week 1): Pt will perform sit<>stand with max assist +2 PT Short Term Goal 3 (Week 1): Pt will maintain sitting balance statically EOB with CGA x 5 minutes  Skilled Therapeutic Interventions/Progress Updates:    Patient received in bed, more awake today and able to answer simple questions. Donned thigh high TEDS with totalA, then rolled side to side with heavy MaxA for donning of paper scrub pants with totalA for pants management. From there required max-totalAx2 for supine <->sit; once sitting at EOB required MaxA to maintain midline sitting and noted that brief was soaked. Returned to supine and rolled side to side with 2 helpers for change of new brief. From there again returned to sitting at EOB with Max-totalAx2, performed lateral scoot transfer along sliding board with totalAx2 for safety and balance as well. Attempted to correct posture with towel tucked under L side of WC cushion however he continues to demonstrate L lean. BP 173/76 in supine with thigh high TEDs, down to 110/53 when up in [place] chair- MD and RN aware. Per discussion with MD, no need to keep SBP >160 at this time. Patient left up in TIS WC at nursing station, all needs otherwise met.   Therapy Documentation Precautions:  Precautions Precautions: None Precaution Comments: Lt hemiparesis with Lt neglect ; watch BP Restrictions Weight Bearing Restrictions: No Other Position/Activity Restrictions: keep BP >160 Pain: Pain Assessment Pain Scale: Faces Faces Pain Scale: Hurts even more Pain Type: Chronic pain Pain Location: Back Pain Orientation: Lower;Right;Left Pain  Descriptors / Indicators: Discomfort;Aching;Spasm Pain Onset: On-going Patients Stated Pain Goal: 0 Pain Intervention(s): Repositioned;Ambulation/increased activity Multiple Pain Sites: No    Therapy/Group: Individual Therapy   Windell Norfolk, DPT, PN1   Supplemental Physical Therapist [place]    Pager [phone] Acute Rehab Office [phone]    02/05/2020, 12:04 PM

## 2020-02-05 NOTE — Progress Notes (Signed)
[facility] PHYSICAL MEDICINE & REHABILITATION PROGRESS NOTE   Subjective/Complaints: Patient seen laying in bed this morning.  No reported issues overnight.  Limited engagement.  ROS: Unreliable due to cognition  Objective:   No results found. Recent Labs    02/02/20 1629 02/04/20 0451  WBC 11.6* 8.8  HGB 10.6* 11.5*  HCT 32.4* 34.1*  PLT 416* 466*   Recent Labs    02/02/20 1629 02/04/20 0451  NA  --  134*  K  --  3.7  CL  --  98  CO2  --  24  GLUCOSE  --  129*  BUN  --  12  CREATININE 0.62 0.60*  CALCIUM  --  8.8*    Intake/Output Summary (Last 24 hours) at 02/05/2020 0819 Last data filed at 02/05/2020 0500 Gross per 24 hour  Intake 818 ml  Output --  Net 818 ml     Physical Exam: Vital Signs Blood pressure (!) 176/86, pulse 92, temperature 97.6 F (36.4 C), temperature source Oral, resp. rate 16, height 6' (1.829 m), weight 89.8 kg, SpO2 98 %. Constitutional: No distress . Vital signs reviewed. HENT: Normocephalic.  Atraumatic. Eyes: Keeps eyes closed for the most part.  No discharge. Cardiovascular: No JVD. Respiratory: Normal effort.  No stridor. GI: Non-distended. Skin: Warm and dry.  Intact. Psych: Unable to assess due to mentation/engagement Musc: No edema in extremities.  No tenderness in extremities. Neurological: Somnolent Left facial droop. Motor: Limited due to ability to follow commands and attention, spontaneously moving bilateral upper extremities  Right ptosis MAS 1+ LUE EF  Assessment/Plan: 1. Functional deficits secondary to [facility] which require 3+ hours per day of interdisciplinary therapy in a comprehensive inpatient rehab setting.  Physiatrist is providing close team supervision and 24 hour management of active medical problems listed below.  Physiatrist and rehab team continue to assess barriers to discharge/monitor patient progress toward functional and medical goals  Care Tool:  Bathing    Body parts bathed by patient: Left  arm, Face   Body parts bathed by helper: Right arm, Chest, Abdomen, Front perineal area, Buttocks, Right upper leg, Left upper leg, Right lower leg, Left lower leg     Bathing assist Assist Level: Maximal Assistance - Patient 24 - 49%     Upper Body Dressing/Undressing Upper body dressing   What is the patient wearing?: Pull over shirt    Upper body assist Assist Level: Maximal Assistance - Patient 25 - 49%    Lower Body Dressing/Undressing Lower body dressing      What is the patient wearing?: Incontinence brief     Lower body assist Assist for lower body dressing: Maximal Assistance - Patient 25 - 49%     Toileting Toileting    Toileting assist Assist for toileting: Dependent - Patient 0%(Incontinent)     Transfers Chair/bed transfer  Transfers assist     Chair/bed transfer assist level: 2 Helpers     Locomotion Ambulation   Ambulation assist   Ambulation activity did not occur: Safety/medical concerns          Walk 10 feet activity   Assist  Walk 10 feet activity did not occur: Safety/medical concerns        Walk 50 feet activity   Assist Walk 50 feet with 2 turns activity did not occur: Safety/medical concerns         Walk 150 feet activity   Assist Walk 150 feet activity did not occur: Safety/medical concerns  Walk 10 feet on uneven surface  activity   Assist Walk 10 feet on uneven surfaces activity did not occur: Safety/medical concerns         Wheelchair     Assist Will patient use wheelchair at discharge?: Yes Type of Wheelchair: Manual    Wheelchair assist level: Dependent - Patient 0% Max wheelchair distance: 150 ft    Wheelchair 50 feet with 2 turns activity    Assist        Assist Level: Dependent - Patient 0%   Wheelchair 150 feet activity     Assist      Assist Level: Dependent - Patient 0%    Medical Problem List and Plan: 1.  Left-sided weakness secondary to left greater  than right subdural hematoma.  Status post cerebral angiogram balloon angioplasty  Continue CIR  Therapy eval's reviewed-requiring significant assistance from all disciplines, will likely continue to require min/mod assistance from all disciplines at discharge. 2.  Antithrombotics: -DVT/anticoagulation: Lovenox initiated 01/29/2020             -antiplatelet therapy: Aspirin 325 mg daily 3. Pain Management: Oxycodone as needed, Robaxin as needed 4. Mood: Provide emotional support             -antipsychotic agents: N/A  Zoloft daily.  5. Neuropsych: This patient is not capable of making decisions on his own behalf. 6. Skin/Wound Care: Routine skin checks 7. Fluids/Electrolytes/Nutrition: Routine in and outs   Carb modified diet-limited p.o. intake, unchanged.  8.  Diabetes mellitus with hyperglycemia.  Hemoglobin A1c 7.7.  Check blood sugars before meals and at bedtime.  Patient on Tresiba 35 units daily, Glucotrol 10 mg daily, Actos 45 mg daily, Janumet 50-1000 mg twice daily, NovoLog 75 units daily prior to admission.               Levemir 31U BID  Labile on 5/18 9.  Seizure prophylaxis. Keppra 500 mg twice daily 10.  Arrhythmia/wide-complex tachycardia.  Amiodarone 200 mg daily x4 weeks (~6/12).  Follow-up cardiology services 11.  Hypertension with orthostasis.  Autoregulate on Nimotop.  Labile on 5/18, monitor for trend  TEDS and abdominal binder as needed  Discussed blood pressure upper extremity with therapies. 12.  Hyperlipidemia.  Crestor/fenofibrate 13. Mild vasospasm evident on 5/14 vascular transcranial doppler. 14. Spasticity:  Started baclofen 5mg  TID for pain relief and spasticity.  15.  Hyponatremia  Sodium 134 on 5/17  Continue to monitor 16.  Transaminitis  ALT elevated on 5/17  Continue to monitor 17.  Acute blood loss anemia  Hemoglobin 11.5 on 5/17  Continue to monitor   LOS: 3 days A FACE TO FACE EVALUATION WAS PERFORMED  Phynix Horton 6/17 02/05/2020, 8:19  AM

## 2020-02-05 NOTE — Plan of Care (Signed)
  Problem: RH BLADDER ELIMINATION Goal: RH STG MANAGE BLADDER WITH ASSISTANCE Description: STG Manage Bladder With Mod Assistance Outcome: Progressing Goal: RH STG MANAGE BLADDER WITH MEDICATION WITH ASSISTANCE Description: STG Manage Bladder With Medication With Mod I Assistance. Outcome: Progressing   Problem: RH SKIN INTEGRITY Goal: RH STG SKIN FREE OF INFECTION/BREAKDOWN Description: Mod I Outcome: Progressing

## 2020-02-05 NOTE — Progress Notes (Signed)
Occupational Therapy Session Note  Patient Details  Name: Nicholas [name] MRN: [id number] Date of Birth: 1950-10-12  Today's Date: 02/05/2020 OT Individual Time: 1305-1410 OT Individual Time Calculation (min): 65 min    Short Term Goals: Week 1:  OT Short Term Goal 1 (Week 1): Pt will maintain static sitting balance (unsupported) with min A in prep for dyanamic sititng balance in ADL with OT Short Term Goal 2 (Week 1): Pt will don shirt with mod A OT Short Term Goal 3 (Week 1): Pt will perform transfer of max A +1 with LRAD method OT Short Term Goal 4 (Week 1): PT will perform bed mobility in prep for ADL tasks with max A+1 (rolling/ supine to sit)  Skilled Therapeutic Interventions/Progress Updates:    Pt received in tilt in space w/c with son present.  Nurse tech took vitals and pt's blood pressure on low side even with thigh high ted hose.   Pt did engage in following directions with use of his arms and is able to lift his L arm to 110 degrees of flexion.  He holds his R eye closed but claims he does not have any diplopia.    Son talked about pt's love of showering and how getting in the shower could be a strong motivator for patient.  Pt continues to have very impaired postural control and with his low blood pressure it was not appropriate to shower today, but did a  "trial run" using a roll in shower chair that reclines.  Using a standard stedy (the bariatric stedy will be easier to use next time), had pt stand briefly from wc to move to shower chair with TOTAL +2 due to his leaning forward over the bar.  MAX A with multimodal cues to have pt lift head and chest.    On shower seat, pt did maintain midline. Placed at sink to bathe UB with max A.  Used stedy to move back to bed, but pt initially could not come to a full stand even with total A of 2. Pt's wife had just arrived to room. Had pt's wife stand in front of him, and told pt to stand up and kiss his wife. HE DID!  Pt transferred back  bed with max A to move to supine.  Pt adjusted in bed with all needs met.  Bed alarm set.   Therapy Documentation Precautions:  Precautions Precautions: None Precaution Comments: Lt hemiparesis with Lt neglect ; watch BP Restrictions Weight Bearing Restrictions: No Other Position/Activity Restrictions: keep BP >160    Vital Signs: Therapy Vitals Temp: (!) 97.5 F (36.4 C) Temp Source: Oral Pulse Rate: 91 Resp: 17 BP: (!) 105/54 Patient Position (if appropriate): Sitting Oxygen Therapy SpO2: 98 % O2 Device: Room Air Pain: Pain Assessment Pain Scale: Faces Faces Pain Scale: Hurts a little bit Pain Type: Acute pain Pain Location: Back Pain Orientation: Lower Pain Descriptors / Indicators: Discomfort Pain Onset: On-going Patients Stated Pain Goal: 0 Pain Intervention(s): Repositioned Multiple Pain Sites: No   Therapy/Group: Individual Therapy  Accident 02/05/2020, 2:33 PM

## 2020-02-05 NOTE — Progress Notes (Signed)
Speech Language Pathology Daily Session Note  Patient Details  Name: Nicholas [name] MRN: [id number] Date of Birth: 1951-08-14  Today's Date: 02/05/2020 SLP Individual Time: 1100-1156 SLP Individual Time Calculation (min): 56 min  Short Term Goals: Week 1: SLP Short Term Goal 1 (Week 1): Patient will participate in further assessment of cognitive skills. SLP Short Term Goal 2 (Week 1): Patient will respond to basic yes/no questions targeting wants/needs in 8 out of 10 opportunities with max A. SLP Short Term Goal 3 (Week 1): Patient will follow 2 step directions with 80% accuracy and max A. SLP Short Term Goal 4 (Week 1): Patient will participate in visual scanning tasks scannning left of midline in 5 of 10 opportunities with max A. SLP Short Term Goal 5 (Week 1): Patient will participate in a sustained attention task for 5 minutes with max A.  Skilled Therapeutic Interventions: Pt was seen for skilled ST targeting cognitive goals and education with pt's son. Pt required multimodal cues and varying environmental modifications in order to arouse for participation today. He completed a basic calendar making task with Max A primarily verbal cues for scanning left, Min A for sequencing and functional problem solving. He sustained attention with Moderate verbal cues for redirection. Although his verbal responses are delayed, he did answer basic questions regarding occupation and home life accurately (per son). Answered questions from son regarding diet and full supervision requirement, which is due to cognition rather than a swallowing impairment. Also discussed a plan to train son with PO consumption in order for him to be allowed to provide full supervision during meals at next available session. Pt left sitting in tilt in space chair with son present and aware pt would need to be left at RN station for supervision/safety prior to leaving the room. Continue per current plan of care.          Pain Pain  Assessment Pain Score: 0-No pain  Therapy/Group: Individual Therapy  [name] 02/05/2020, 7:10 AM

## 2020-02-05 NOTE — Progress Notes (Signed)
Physical Therapy Session Note  Patient Details  Name: Nicholas [name] MRN: [id number] Date of Birth: 1950-11-28  Today's Date: 02/05/2020 PT Individual Time: 1004-1036 PT Individual Time Calculation (min): 32 min   Short Term Goals: Week 1:  PT Short Term Goal 1 (Week 1): Pt will perform bed<>chair transfer max +2 without lift equipment PT Short Term Goal 2 (Week 1): Pt will perform sit<>stand with max assist +2 PT Short Term Goal 3 (Week 1): Pt will maintain sitting balance statically EOB with CGA x 5 minutes  Skilled Therapeutic Interventions/Progress Updates:    Pt received sitting in TIS w/c with his son present and pt agreeable to therapy session. Pt continues to have R gaze preference. Pt's son reports that he is more alert today compared to yesterday. Therapist educated on importance of pressure relief via tilting backwards in TIS w/c every 30 minutes as well as family engaging pt in L attention conversations/tasks while in room. Transported to/from gym in w/c for time management and energy conservation. Sitting in TIS w/c performed L visual scanning, attention, and L UE NMR task via locating and grasping lego blocks on high-low table with min hand-over-hand assist to facilitate task - demonstrates ability to maintain L attention during the task. Sit<>stand 1x w/c<>stedy with heavy +2 max assist for lifting and balance due to pt pushing with R arm towards the L - tolerates sitting in high perched position in stedy for ~20seconds with pt reporting pain on plantar surface of B LEs pt describes as being located at "points of contact" of feet to the hard surface. Plan to wear shoes for all standing activity for pain management. Transported back to room and performed pressure relief then pt left seated in TIS w/c with needs in reach and his son present.   Therapy Documentation Precautions:  Precautions Precautions: None Precaution Comments: Lt hemiparesis with Lt neglect ; watch  BP Restrictions Weight Bearing Restrictions: No Other Position/Activity Restrictions: keep BP >160  Pain: Reports LBP - medication not due at this time - provided rest breaks and repositioning for pain management. Reports B LE feet pain coming to stand in stedy with pt    Therapy/Group: Individual Therapy  Ginny Forth, PT, DPT 02/05/2020, 7:54 AM

## 2020-02-06 ENCOUNTER — Inpatient Hospital Stay (HOSPITAL_COMMUNITY): Payer: BC Managed Care – PPO | Admitting: Occupational Therapy

## 2020-02-06 ENCOUNTER — Inpatient Hospital Stay (HOSPITAL_COMMUNITY): Payer: BC Managed Care – PPO | Admitting: Physical Therapy

## 2020-02-06 ENCOUNTER — Inpatient Hospital Stay (HOSPITAL_COMMUNITY): Payer: BC Managed Care – PPO | Admitting: Speech Pathology

## 2020-02-06 DIAGNOSIS — R7309 Other abnormal glucose: Secondary | ICD-10-CM

## 2020-02-06 DIAGNOSIS — R0989 Other specified symptoms and signs involving the circulatory and respiratory systems: Secondary | ICD-10-CM

## 2020-02-06 DIAGNOSIS — G8110 Spastic hemiplegia affecting unspecified side: Secondary | ICD-10-CM

## 2020-02-06 LAB — GLUCOSE, CAPILLARY
Glucose-Capillary: 132 mg/dL — ABNORMAL HIGH (ref 70–99)
Glucose-Capillary: 219 mg/dL — ABNORMAL HIGH (ref 70–99)
Glucose-Capillary: 192 mg/dL — ABNORMAL HIGH (ref 70–99)
Glucose-Capillary: 181 mg/dL — ABNORMAL HIGH (ref 70–99)

## 2020-02-06 NOTE — Progress Notes (Signed)
Occupational Therapy Session Note  Patient Details  Name: Nicholas [name] MRN: [id number] Date of Birth: 12-Jun-1951  Today's Date: 02/06/2020 OT Individual Time: 9937-1696 OT Individual Time Calculation (min): 42 min    Short Term Goals: Week 1:  OT Short Term Goal 1 (Week 1): Pt will maintain static sitting balance (unsupported) with min A in prep for dyanamic sititng balance in ADL with OT Short Term Goal 2 (Week 1): Pt will don shirt with mod A OT Short Term Goal 3 (Week 1): Pt will perform transfer of max A +1 with LRAD method OT Short Term Goal 4 (Week 1): PT will perform bed mobility in prep for ADL tasks with max A+1 (rolling/ supine to sit)  Skilled Therapeutic Interventions/Progress Updates:   Pt up in w/c alert, with son Elliot Gurney present, upon OT arrival. Pt agreeable to washing UB sinkside this morning.  Pt leaning to left side needing min VCs to look in mirror for visual feedback and initiate self righting to center.  Pt able to correct initially however unable to sustain.  Pt requiring VCs to initiate and sequence through all tasks due to left sided neglect, delayed processing, and post-stroke apathy.   Provided HOH to facilitate washing right side of body using left hand.  Pt able to carryover part of task after assist with initiation.  VCs and TCs to facilitate lifting of LUE to wash under arm using right hand.   Pt exhibited increased sitting balance and activity tolerance this session evidenced by improved upright posture during squat pivot transfer near end of session however still requiring max VCs and TCs for body mechanics and max assist + 2 to complete.  Removal of condom cathetar complete with total dependence.Slightly more verbal and interactive this day with simple repetitive VCs and increased processing time allowed.  Call bell in reach, bed alarm on, bed rails placed.         Therapy Documentation Precautions:  Precautions Precautions: None Precaution Comments: Lt  hemiparesis with Lt neglect ; watch BP Restrictions Weight Bearing Restrictions: No Other Position/Activity Restrictions: keep BP >160   Pain: Pain Assessment Pain Scale: Faces Faces Pain Scale: No hurt Pain Type: Acute pain Pain Location: Back Pain Orientation: Lower Pain Descriptors / Indicators: Discomfort;Sore Pain Onset: On-going Patients Stated Pain Goal: 0 Pain Intervention(s): Repositioned;Ambulation/increased activity;Therapeutic touch;Emotional support ADL: ADL Eating: Maximal assistance Grooming: Dependent Upper Body Bathing: Maximal assistance Where Assessed-Upper Body Bathing: Sitting at sink Lower Body Bathing: Dependent Where Assessed-Lower Body Bathing: Sitting at sink Upper Body Dressing: Maximal assistance Where Assessed-Upper Body Dressing: Sitting at sink Lower Body Dressing: Dependent(+2) Where Assessed-Lower Body Dressing: Sitting at sink Toileting: Not assessed Toilet Transfer: Not assessed Walk-In Shower Transfer: Not assessed      Therapy/Group: Individual Therapy  Amie Critchley 02/06/2020, 3:06 PM

## 2020-02-06 NOTE — Progress Notes (Signed)
Physical Therapy Session Note  Patient Details  Name: Nicholas [name] MRN: [id number] Date of Birth: [birth year]  Today's Date: 02/06/2020 PT Individual Time: 1624-4695 PT Individual Time Calculation (min): 57 min   Short Term Goals: Week 1:  PT Short Term Goal 1 (Week 1): Pt will perform bed<>chair transfer max +2 without lift equipment PT Short Term Goal 2 (Week 1): Pt will perform sit<>stand with max assist +2 PT Short Term Goal 3 (Week 1): Pt will maintain sitting balance statically EOB with CGA x 5 minutes  Skilled Therapeutic Interventions/Progress Updates:    Patient received in bed, son Nicholas [name] also present this morning and physically assisted in session today. Patient continues to report back pain, performed [facility] and lumbar rotation exercises to improve mobility of lumbar spine and reduce pain- much more resistance and possible tone noted throughout L sided musculature. Found to be soiled and rolled side to side with maxA and use of bed rails, max cues; NT assisted in pericare and changing brief. From there required Max-totalAx2 for practice of supine to sit transfers- on first attempt ended up too close to EOB and needed to return to supine to scoot back from the edge before returning to sitting at EOB. Required max-totalAx2 for sit to stand in the stedy and continues to have difficulty with upright posture/L lean; totalAx2 in stedy to pivot to TIS chair. Left up in TIS [facility] with all needs met, seatbelt on chair placed and son Nicholas [name] providing direct supervision, RN aware of patient status.   Therapy Documentation Precautions:  Precautions Precautions: None Precaution Comments: Lt hemiparesis with Lt neglect ; watch BP Restrictions Weight Bearing Restrictions: No Other Position/Activity Restrictions: keep BP >160 Pain: Pain Assessment Pain Scale: Faces Pain Score: 0-No pain Faces Pain Scale: Hurts little more Pain Type: Acute pain Pain Location: Back Pain Orientation: Lower Pain  Descriptors / Indicators: Discomfort;Sore Pain Onset: On-going Patients Stated Pain Goal: 0 Pain Intervention(s): Repositioned;Ambulation/increased activity;Therapeutic touch;Emotional support    Therapy/Group: Individual Therapy   Windell Norfolk, DPT, PN1   Supplemental Physical Therapist [place]    Pager [phone] Acute Rehab Office [phone]    02/06/2020, 12:12 PM

## 2020-02-06 NOTE — Progress Notes (Signed)
[place] PHYSICAL MEDICINE & REHABILITATION PROGRESS NOTE   Subjective/Complaints: Patient seen laying in bed this morning.  No reported issues overnight.  Patient with some increase in spontaneous responses and minimal following of commands this morning.  ROS: Unreliable due to cognition  Objective:   No results found. Recent Labs    02/04/20 0451  WBC 8.8  HGB 11.5*  HCT 34.1*  PLT 466*   Recent Labs    02/04/20 0451  NA 134*  K 3.7  CL 98  CO2 24  GLUCOSE 129*  BUN 12  CREATININE 0.60*  CALCIUM 8.8*    Intake/Output Summary (Last 24 hours) at 02/06/2020 0743 Last data filed at 02/06/2020 0401 Gross per 24 hour  Intake 100 ml  Output 1050 ml  Net -950 ml     Physical Exam: Vital Signs Blood pressure (!) 157/84, pulse 89, temperature 98.6 F (37 C), temperature source Oral, resp. rate 16, height 6' (1.829 m), weight 89.8 kg, SpO2 96 %. Constitutional: No distress . Vital signs reviewed. HENT: Normocephalic.  Atraumatic. Eyes: Briefly opens eyes.  No discharge. Cardiovascular: No JVD. Respiratory: Normal effort.  No stridor. GI: Non-distended. Skin: Warm and dry.  Intact. Psych: Unable to assess due to mentation Musc: No edema in extremities.  No tenderness in extremities. Neurological: Alert Left facial droop, unchanged. Motor: Limited follow commands, spontaneously moving bilateral upper extremities, unchanged Right ptosis  Assessment/Plan: 1. Functional deficits secondary to [facility] which require 3+ hours per day of interdisciplinary therapy in a comprehensive inpatient rehab setting.  Physiatrist is providing close team supervision and 24 hour management of active medical problems listed below.  Physiatrist and rehab team continue to assess barriers to discharge/monitor patient progress toward functional and medical goals  Care Tool:  Bathing    Body parts bathed by patient: Face   Body parts bathed by helper: Right arm, Left arm, Chest,  Abdomen(UB only)     Bathing assist Assist Level: Maximal Assistance - Patient 24 - 49%     Upper Body Dressing/Undressing Upper body dressing   What is the patient wearing?: Hospital gown only    Upper body assist Assist Level: Maximal Assistance - Patient 25 - 49%    Lower Body Dressing/Undressing Lower body dressing      What is the patient wearing?: Incontinence brief     Lower body assist Assist for lower body dressing: Total Assistance - Patient < 25%     Toileting Toileting    Toileting assist Assist for toileting: Dependent - Patient 0%(Incontinent)     Transfers Chair/bed transfer  Transfers assist     Chair/bed transfer assist level: 2 Helpers     Locomotion Ambulation   Ambulation assist   Ambulation activity did not occur: Safety/medical concerns          Walk 10 feet activity   Assist  Walk 10 feet activity did not occur: Safety/medical concerns        Walk 50 feet activity   Assist Walk 50 feet with 2 turns activity did not occur: Safety/medical concerns         Walk 150 feet activity   Assist Walk 150 feet activity did not occur: Safety/medical concerns         Walk 10 feet on uneven surface  activity   Assist Walk 10 feet on uneven surfaces activity did not occur: Safety/medical concerns         Wheelchair     Assist Will patient use wheelchair at  discharge?: Yes Type of Wheelchair: Manual    Wheelchair assist level: Dependent - Patient 0% Max wheelchair distance: 150 ft    Wheelchair 50 feet with 2 turns activity    Assist        Assist Level: Dependent - Patient 0%   Wheelchair 150 feet activity     Assist      Assist Level: Dependent - Patient 0%    Medical Problem List and Plan: 1.  Left-sided weakness secondary to left greater than right subdural hematoma.  Status post cerebral angiogram balloon angioplasty  Continue CIR  Team conference today to discuss current and goals  and coordination of care, home and environmental barriers, and discharge planning with nursing, case manager, and therapies.  2.  Antithrombotics: -DVT/anticoagulation: Lovenox initiated 01/29/2020             -antiplatelet therapy: Aspirin 325 mg daily 3. Pain Management: Oxycodone as needed, Robaxin as needed 4. Mood: Provide emotional support             -antipsychotic agents: N/A  Zoloft daily.  5. Neuropsych: This patient is not capable of making decisions on his own behalf. 6. Skin/Wound Care: Routine skin checks 7. Fluids/Electrolytes/Nutrition: Routine in and outs   Carb modified diet- p.o. intake remains overall limited. 8.  Diabetes mellitus with hyperglycemia.  Hemoglobin A1c 7.7.  Check blood sugars before meals and at bedtime.  Patient on Tresiba 35 units daily, Glucotrol 10 mg daily, Actos 45 mg daily, Janumet 50-1000 mg twice daily, NovoLog 75 units daily prior to admission.               Levemir 31U BID  Labile on 5/19, will consider changes when PO intake stabilizes  9.  Seizure prophylaxis. Keppra 500 mg twice daily 10.  Arrhythmia/wide-complex tachycardia.  Amiodarone 200 mg daily x4 weeks (~6/12).  Follow-up cardiology services 11.  Hypertension with orthostasis.  Autoregulate on Nimotop.  Labile on 5/19, will avoid overcorrection   TEDS and abdominal binder as needed  12.  Hyperlipidemia.  Crestor/fenofibrate 13. Mild vasospasm evident on 5/14 vascular transcranial doppler. 14. Spasticity:  Started baclofen 5mg  TID for pain relief and spasticity.   Improving 15.  Hyponatremia  Sodium 134 on 5/17  Continue to monitor 16.  Transaminitis  ALT elevated on 5/17  Continue to monitor 17.  Acute blood loss anemia  Hemoglobin 11.5 on 5/17  Continue to monitor   LOS: 4 days A FACE TO FACE EVALUATION WAS PERFORMED  Raahi Korber 6/17 02/06/2020, 7:43 AM

## 2020-02-06 NOTE — Progress Notes (Signed)
Team Conference Report to Patient/Family  Team Conference discussion was reviewed with the patient and caregiver, including goals, any changes in plan of care and target discharge date.  Patient and caregiver express understanding and are in agreement.  The patient has a target discharge date of 03/04/20. Patient still complaining of pain. Informed spouse and son that since patient is cognitively not able to make decision currently we would not be able to complete POA paperwork in hospital.    Andria Rhein 02/06/2020, 2:43 PM Patient ID: Nicholas [name], male   DOB: 1950/11/04, 69 y.o.   MRN: [id number]

## 2020-02-06 NOTE — Patient Care Conference (Signed)
Inpatient RehabilitationTeam Conference and Plan of Care Update Date: 02/06/2020   Time: 2:35 PM    Patient Name: Nicholas [name]      Medical Record Number: [id number]  Date of Birth: [birth year] Sex: Male         Room/Bed: 4W01C/4W01C-01 Payor Info: Payor: BLUE CROSS BLUE SHIELD / Plan: BCBS COMM PPO / Product Type: *No Product type* /    Admit Date/Time:  02/02/2020  3:33 PM  Primary Diagnosis:  SDH (subdural hematoma) (HCC)  Patient Active Problem List   Diagnosis Date Noted  . Spastic hemiparesis (HCC)   . Labile blood pressure   . Labile blood glucose   . Pressure injury of skin 02/05/2020  . Orthostasis   . Acute blood loss anemia   . Hypertensive crisis   . Hyponatremia   . SDH (subdural hematoma) (HCC)   . Transaminitis   . Wide-complex tachycardia (HCC)   . Intracranial vasospasm   . Essential hypertension   . Controlled type 2 diabetes mellitus with hyperglycemia (HCC)   . Hepatitis   . Abdominal distention   . NSVT (nonsustained ventricular tachycardia) (HCC)   . Rate-related bundle branch block   . Acute hypoxemic respiratory failure (HCC)   . Aneurysm (HCC)   . SAH (subarachnoid hemorrhage) (HCC) 01/14/2020  . Other and unspecified hyperlipidemia 07/17/2013  . Need for prophylactic vaccination and inoculation against influenza 07/17/2013  . Type II or unspecified type diabetes mellitus without mention of complication, not stated as uncontrolled 04/12/2013  . 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 Elevated transaminase measurement 04/12/2013  . Type II or unspecified type diabetes mellitus without mention of complication, uncontrolled 01/05/2013  . Essential hypertension, benign 01/05/2013  . BPH (benign prostatic hyperplasia) 01/05/2013  . Overweight(278.02) 01/05/2013    Expected Discharge Date: Expected Discharge Date: 03/04/20  Team Members Present: Physician leading conference: Dr. Maryla Morrow Care Coodinator Present: Cheyenne Adas, RN, BSN, CRRN;Christina  Vita Barley, BSW Nurse Present: Doran Durand, LPN PT Present: Nedra Hai, PT OT Present: Primitivo Gauze, OT SLP Present: Suzzette Righter, CF-SLP PPS Coordinator present : Fae Pippin, SLP     Current Status/Progress Goal Weekly Team Focus  Bowel/Bladder   Incontinence of B/B. Condom cathetar HS.  Regain continence of B/B.  Assess need for toileting. Timed toileting.   Swallow/Nutrition/ Hydration   full supervision due to cognition, regular thin     training family to provide supervision   ADL's   total A of 2 overall due to lethargy, poor postural control, pusher tendencies, apraxia, decreased attention  min A toilet transfer, dressing, bathing; mod A toileting and shower transfer  ADL training, postural control/ balance, L side awareness, LUE NMR   Mobility   max-totalAx2 for all aspects of functional mobilty, maxA to maintain sitting balance, max-totalAx2 for lateral scoot transfers.  mod-maxA, WC with S  low level mobility and balance, lateral transfers, tolerance to upright   Communication             Safety/Cognition/ Behavioral Observations  Mod-Max depending on fatigue  Min  initiation, sustained attention, functional problem solving, recall   Pain   Very minimal complaints of pain. Has Acetaminophen 650mg  and Oxycodone-acetaminophen 5-325mg  PRN.  Maintain control of pain. 0-10 level/ 0.  Assess pain Q-Shift   Skin   Stage 1 to sacrum  Heal skin issues  Assess Q-shift/ Turn Q2H    Rehab Goals Patient on target to meet rehab goals: Yes Rehab Goals Revised: patient evaluation *See Care Plan and progress  notes for long and short-term goals.     Barriers to Discharge  Current Status/Progress Possible Resolutions Date Resolved   Nursing                  PT  Home environment access/layout;Behavior;Inaccessible home environment;Incontinence  heavy levels of physical assistance at current time- might potentially need ramp?              OT                  SLP                 Care Coordinator Medical stability              Discharge Planning/Teaching Needs:  plans to discharge home  will schedule   Team Discussion:  Poor engagement with therapy. Low back pain, pushes, poor attention, low endurance, poor upright tolerance and decreased visual scanning effecting progression noted. Goals set for minimal assistance.  Revisions to Treatment Plan:      Medical Summary Current Status: Left-sided weakness secondary to left greater than right subdural hematoma status post cerebral angiogram balloon angioplasty Weekly Focus/Goal: Improve mobility, CBGs, HTN, spasticity, Na+  Barriers to Discharge: Behavior;Medical stability;Incontinence   Possible Resolutions to Barriers: Therapies, follow labs, optimize spasticity meds, optimize BP/DM meds   Continued Need for Acute Rehabilitation Level of Care: The patient requires daily medical management by a physician with specialized training in physical medicine and rehabilitation for the following reasons: Direction of a multidisciplinary physical rehabilitation program to maximize functional independence : Yes Medical management of patient stability for increased activity during participation in an intensive rehabilitation regime.: Yes Analysis of laboratory values and/or radiology reports with any subsequent need for medication adjustment and/or medical intervention. : Yes   I attest that I was present, lead the team conference, and concur with the assessment and plan of the team.   Dorien Chihuahua B 02/06/2020, 2:35 PM

## 2020-02-06 NOTE — Progress Notes (Signed)
Physical Therapy Session Note  Patient Details  Name: Nicholas [name] MRN: [id number] Date of Birth: December 27, 1950  Today's Date: 02/06/2020 PT Individual Time: 4098-1191 PT Individual Time Calculation (min): 57 min   Short Term Goals: Week 1:  PT Short Term Goal 1 (Week 1): Pt will perform bed<>chair transfer max +2 without lift equipment PT Short Term Goal 2 (Week 1): Pt will perform sit<>stand with max assist +2 PT Short Term Goal 3 (Week 1): Pt will maintain sitting balance statically EOB with CGA x 5 minutes  Skilled Therapeutic Interventions/Progress Updates:    Patient received in bed, fatigued and lethargic- utilized tactile stimulation, wet washcloth, pinched beds of fingernails, and performed sternal rub; patient had localized reaction however did not really wake until we transferred him to EOB with max-totalAx2. Once at EOB and sitting midline generally able to maintain midline with Min-ModA with cues for postural control today! Continues to require Max-totalAx2 for sliding board transfers with max verbal and tactile cues for correct sequencing and participation. Once up in chair noted he was soiled with incontinent BM and used sliding board to get back to bed, required +2 to return to supine and then MaxA for rolling and totalA for pericare. Positioned to comfort in bed and left with all needs met, bed alarm active and son [name] present, RN aware of need for new sacral pad. Discussed progress with [name] and general expected progression with CIR as well as potential shift to 15/7 schedule depending on how the next couple of days go and if rest of team is in agreement.   Therapy Documentation Precautions:  Precautions Precautions: None Precaution Comments: Lt hemiparesis with Lt neglect ; watch BP Restrictions Weight Bearing Restrictions: No Other Position/Activity Restrictions: keep BP >160 General:  Pain: Pain Assessment Pain Scale: Faces Faces Pain Scale: Hurts little more Pain  Type: Acute pain Pain Location: Leg Pain Orientation: Left Pain Descriptors / Indicators: Aching;Sore Pain Onset: With Activity Patients Stated Pain Goal: 0 Pain Intervention(s): Repositioned;Emotional support    Therapy/Group: Individual Therapy   Windell Norfolk, DPT, PN1   Supplemental Physical Therapist [place]    Pager [phone] Acute Rehab Office [phone]    02/06/2020, 3:41 PM

## 2020-02-06 NOTE — Progress Notes (Signed)
Occupational Therapy Session Note  Patient Details  Name: Nicholas [name] MRN: [id number] Date of Birth: 01/29/1951  Today's Date: 02/06/2020 OT Individual Time: 3762-8315 OT Individual Time Calculation (min): 42 min    Short Term Goals: Week 1:  OT Short Term Goal 1 (Week 1): Pt will maintain static sitting balance (unsupported) with min A in prep for dyanamic sititng balance in ADL with OT Short Term Goal 2 (Week 1): Pt will don shirt with mod A OT Short Term Goal 3 (Week 1): Pt will perform transfer of max A +1 with LRAD method OT Short Term Goal 4 (Week 1): PT will perform bed mobility in prep for ADL tasks with max A+1 (rolling/ supine to sit)  Skilled Therapeutic Interventions/Progress Updates:    Upon entering the room, pt supine in bed and sleeping heavily. Pt's son present in the room who reports pt lethargy since last session. OT notified RN and vitals assessed this session all WFLs. OT discussed possible need to adjust pt's schedule or have fixed time to increase participation. Pt very lethargic but did wake up after being in room for ~20 minutes. Sit >supine with total A to EOB. Static sitting balance with min A for ~ 4 minutes with hands placed in lap. Pt then taking several bites of lunch and bringing cup to mouth with increased time, mod multimodal cuing and min - mod A for balance. Minimal amount of pushing noted this session. Pt's fatigue increasing and returned to supine with +2 for safety. Call bell and all needed items within reach upon exiting the room.   Therapy Documentation Precautions:  Precautions Precautions: None Precaution Comments: Lt hemiparesis with Lt neglect ; watch BP Restrictions Weight Bearing Restrictions: No Other Position/Activity Restrictions: keep BP >160  Pain: Pain Assessment Pain Scale: Faces Faces Pain Scale: No hurt Pain Type: Acute pain Pain Location: Back Pain Orientation: Lower Pain Descriptors / Indicators: Discomfort;Sore Pain Onset:  On-going Patients Stated Pain Goal: 0 Pain Intervention(s): Repositioned;Ambulation/increased activity;Therapeutic touch;Emotional support ADL: ADL Eating: Maximal assistance Grooming: Dependent Upper Body Bathing: Maximal assistance Where Assessed-Upper Body Bathing: Sitting at sink Lower Body Bathing: Dependent Where Assessed-Lower Body Bathing: Sitting at sink Upper Body Dressing: Maximal assistance Where Assessed-Upper Body Dressing: Sitting at sink Lower Body Dressing: Dependent(+2) Where Assessed-Lower Body Dressing: Sitting at sink Toileting: Not assessed Toilet Transfer: Not assessed Walk-In Shower Transfer: Not assessed   Therapy/Group: Individual Therapy  Alen Bleacher 02/06/2020, 2:57 PM

## 2020-02-06 NOTE — Plan of Care (Signed)
  Problem: RH BOWEL ELIMINATION Goal: RH STG MANAGE BOWEL W/MEDICATION W/ASSISTANCE Description: STG Manage Bowel with Medication with  Mod Assistance. Outcome: Progressing   Problem: RH BLADDER ELIMINATION Goal: RH STG MANAGE BLADDER WITH ASSISTANCE Description: STG Manage Bladder With Mod Assistance Outcome: Progressing   Problem: RH SAFETY Goal: RH STG DECREASED RISK OF FALL WITH ASSISTANCE Description: STG Decreased Risk of Fall With [employer]. Outcome: Progressing

## 2020-02-06 NOTE — Progress Notes (Signed)
Speech Language Pathology Daily Session Note  Patient Details  Name: Nicholas [name] MRN: [id number] Date of Birth: [birth year]  Today's Date: 02/06/2020 SLP Individual Time: 0952-1019 SLP Individual Time Calculation (min): 27 min  Short Term Goals: Week 1: SLP Short Term Goal 1 (Week 1): Patient will participate in further assessment of cognitive skills. SLP Short Term Goal 2 (Week 1): Patient will respond to basic yes/no questions targeting wants/needs in 8 out of 10 opportunities with max A. SLP Short Term Goal 3 (Week 1): Patient will follow 2 step directions with 80% accuracy and max A. SLP Short Term Goal 4 (Week 1): Patient will participate in visual scanning tasks scannning left of midline in 5 of 10 opportunities with max A. SLP Short Term Goal 5 (Week 1): Patient will participate in a sustained attention task for 5 minutes with max A.  Skilled Therapeutic Interventions: Pt was seen for skilled ST targeting cognitive goals and family education/training for full supervision during meals. Pt's son present for training. SLP reviewed cognitive impairments that would impact pt's safe intake (ex: arousal level, attention, visual scanning, initiation, etc.). Reviewed optimal positioning for safe PO intake in both tilt in space chair as well as when in the bed. Pt's son demonstrated ability to provide appropriate verbal cues for sustained attention, slow rate, as well as necessary repositioning to correct left lean. Pt's son was approved to provide supervision during meals. Pt left sitting in tilt in space chair with son present and needs/questions met to their satisfaction. Continue per current plan of care.        Pain Pain Assessment Pain Scale: Faces Faces Pain Scale: No hurt   Therapy/Group: Individual Therapy  Arbutus Leas 02/06/2020, 6:51 AM

## 2020-02-07 ENCOUNTER — Inpatient Hospital Stay (HOSPITAL_COMMUNITY): Payer: BC Managed Care – PPO | Admitting: Speech Pathology

## 2020-02-07 ENCOUNTER — Inpatient Hospital Stay (HOSPITAL_COMMUNITY): Payer: BC Managed Care – PPO | Admitting: Physical Therapy

## 2020-02-07 ENCOUNTER — Inpatient Hospital Stay (HOSPITAL_COMMUNITY): Payer: BC Managed Care – PPO | Admitting: Occupational Therapy

## 2020-02-07 LAB — GLUCOSE, CAPILLARY
Glucose-Capillary: 76 mg/dL (ref 70–99)
Glucose-Capillary: 187 mg/dL — ABNORMAL HIGH (ref 70–99)
Glucose-Capillary: 191 mg/dL — ABNORMAL HIGH (ref 70–99)
Glucose-Capillary: 211 mg/dL — ABNORMAL HIGH (ref 70–99)

## 2020-02-07 MED ORDER — BACLOFEN 5 MG HALF TABLET
5.0000 mg | ORAL_TABLET | Freq: Every day | ORAL | Status: DC
  Administered 2020-02-07 – 2020-02-19 (×13): 5 mg via ORAL
  Filled 2020-02-07 (×13): qty 1

## 2020-02-07 NOTE — Progress Notes (Signed)
Speech Language Pathology Daily Session Note  Patient Details  Name: Nicholas [name] MRN: [id number] Date of Birth: 03-29-1951  Today's Date: 02/07/2020 SLP Individual Time: 1400-1429 SLP Individual Time Calculation (min): 29 min  Short Term Goals: Week 1: SLP Short Term Goal 1 (Week 1): Patient will participate in further assessment of cognitive skills. SLP Short Term Goal 2 (Week 1): Patient will respond to basic yes/no questions targeting wants/needs in 8 out of 10 opportunities with max A. SLP Short Term Goal 3 (Week 1): Patient will follow 2 step directions with 80% accuracy and max A. SLP Short Term Goal 4 (Week 1): Patient will participate in visual scanning tasks scannning left of midline in 5 of 10 opportunities with max A. SLP Short Term Goal 5 (Week 1): Patient will participate in a sustained attention task for 5 minutes with max A.  Skilled Therapeutic Interventions: Pt was seen for skilled ST targeting cognition. He was very limited by fatigue today, and minimally engaged in functional activities. Extra time and Max A multimodal stimulation required to achieve alert state and focused attention, initially. He then participated in basic self care tasks with overall Min A verbal and tactile cueing for initiation and sustained attention. He became perseverative on action of brushing his teeth, requiring increased cues to cease this task. Pt minimally verbal today but answered "yes" to "do you know where you are?" - unable to elaborate further or state family members' names. Pt left in bed with alarm on and needs within reach, wife and family still present. Continue per current plan of care.       Pain Pain Assessment Pain Scale: Faces Faces Pain Scale: No hurt  Therapy/Group: Individual Therapy  [name] 02/07/2020, 7:27 AM

## 2020-02-07 NOTE — Progress Notes (Signed)
Occupational Therapy Session Note  Patient Details  Name: Nicholas [name] MRN: [id number] Date of Birth: 03/30/1951  Today's Date: 02/07/2020 OT Individual Time: 4854-6270 OT Individual Time Calculation (min): 60 min    Short Term Goals: Week 1:  OT Short Term Goal 1 (Week 1): Pt will maintain static sitting balance (unsupported) with min A in prep for dyanamic sititng balance in ADL with OT Short Term Goal 2 (Week 1): Pt will don shirt with mod A OT Short Term Goal 3 (Week 1): Pt will perform transfer of max A +1 with LRAD method OT Short Term Goal 4 (Week 1): PT will perform bed mobility in prep for ADL tasks with max A+1 (rolling/ supine to sit)  Skilled Therapeutic Interventions/Progress Updates:    Pt awake in bed with wife present for first few minutes of session.  Pt reporting yes when asked if he would like to take a shower.Assessed BP: 164/84 mmHg at beginning of session, therefore continued without compression socks for bathing.    Pt instructed through bed mobility in preperation for bathing.  Pt required max assist and multimodel cueing supine to sit.  Pt pushing posteriorly during static sitting EOB requiring max +2 to shift buttocks back onto mattress and lean trunk forward.  Guided pt through [facility] transfer EOB to reclining shower chair with max assist +2.  Pt leaning significantly to left and able to intermittently and partially correct to center with max VCs.  Educated pt on use of long handled sponge to wash BLE and back with some follow through after therapist providing hand over hand to initiate each task.  Provided neuro re-ed to LUE using dependent hand over hand to hold wash cloth and wash right side UB.    Pt exhibited less active movement in LUE and LLE throughout session this day likely due to inattention to left side.  Limited activity tolerance exhibited by significantly decreased trunk control and left attention after seated showering.  Pt requiring increased multimodal  cueing to complete Stedy STS transfer back to bed.  Total assist needed nearing end of session for bed level dressing due to fatigue noted and decreased attention and processing.  BP checked end of session and WNL.  Call bell in reach, bed rails placed.   Therapy Documentation Precautions:  Precautions Precautions: None Precaution Comments: Lt hemiparesis with Lt neglect ; watch BP Restrictions Weight Bearing Restrictions: No Other Position/Activity Restrictions: keep BP >160 Pain: Pain Assessment Pain Scale: Faces Faces Pain Scale: No hurt ADL: ADL Eating: Maximal assistance Grooming: Dependent Upper Body Bathing: Maximal assistance Where Assessed-Upper Body Bathing: Sitting at sink Lower Body Bathing: Dependent Where Assessed-Lower Body Bathing: Sitting at sink Upper Body Dressing: Maximal assistance Where Assessed-Upper Body Dressing: Sitting at sink Lower Body Dressing: Dependent(+2) Where Assessed-Lower Body Dressing: Sitting at sink Toileting: Not assessed Toilet Transfer: Not assessed Walk-In Shower Transfer: Not assessed     Therapy/Group: Individual Therapy  Amie Critchley 02/07/2020, 12:38 PM

## 2020-02-07 NOTE — Progress Notes (Signed)
[place] PHYSICAL MEDICINE & REHABILITATION PROGRESS NOTE   Subjective/Complaints: Patient seen laying in bed this morning.  He states he slept well overnight.  He engages more this morning.  He states he has "soreness to the small of my back".  ROS: Denies CP, SOB, N/V/D  Objective:   No results found. No results for input(s): WBC, HGB, HCT, PLT in the last 72 hours. No results for input(s): NA, K, CL, CO2, GLUCOSE, BUN, CREATININE, CALCIUM in the last 72 hours.  Intake/Output Summary (Last 24 hours) at 02/07/2020 0817 Last data filed at 02/07/2020 0700 Gross per 24 hour  Intake 1360 ml  Output 500 ml  Net 860 ml     Physical Exam: Vital Signs Blood pressure (!) 164/85, pulse 85, temperature 98.1 F (36.7 C), temperature source Oral, resp. rate 18, height 6' (1.829 m), weight 89.8 kg, SpO2 96 %. Constitutional: No distress . Vital signs reviewed. HENT: Normocephalic.  Atraumatic. Eyes: Right gaze preference.  No discharge. Cardiovascular: No JVD. Respiratory: Normal effort.  No stridor. GI: Non-distended. Skin: Warm and dry.  Intact. Psych: Flat. Musc: No edema in extremities.  No tenderness in extremities. Neurological: Alert Left facial droop, stable Right lean Motor: LUE: 3-3+/5 proximal distal with apraxia LLE: 2+/5 proximal distal (?limited due to participation) Right ptosis Difficult to assess tone due to patient resistance  Assessment/Plan: 1. Functional deficits secondary to [facility] which require 3+ hours per day of interdisciplinary therapy in a comprehensive inpatient rehab setting.  Physiatrist is providing close team supervision and 24 hour management of active medical problems listed below.  Physiatrist and rehab team continue to assess barriers to discharge/monitor patient progress toward functional and medical goals  Care Tool:  Bathing    Body parts bathed by patient: Face   Body parts bathed by helper: Right arm, Left arm, Chest, Abdomen(UB  only)     Bathing assist Assist Level: Maximal Assistance - Patient 24 - 49%     Upper Body Dressing/Undressing Upper body dressing   What is the patient wearing?: Hospital gown only    Upper body assist Assist Level: Maximal Assistance - Patient 25 - 49%    Lower Body Dressing/Undressing Lower body dressing      What is the patient wearing?: Incontinence brief     Lower body assist Assist for lower body dressing: Total Assistance - Patient < 25%     Toileting Toileting    Toileting assist Assist for toileting: Dependent - Patient 0%(Incontinent)     Transfers Chair/bed transfer  Transfers assist     Chair/bed transfer assist level: Dependent - mechanical lift(stedy)     Locomotion Ambulation   Ambulation assist   Ambulation activity did not occur: Safety/medical concerns          Walk 10 feet activity   Assist  Walk 10 feet activity did not occur: Safety/medical concerns        Walk 50 feet activity   Assist Walk 50 feet with 2 turns activity did not occur: Safety/medical concerns         Walk 150 feet activity   Assist Walk 150 feet activity did not occur: Safety/medical concerns         Walk 10 feet on uneven surface  activity   Assist Walk 10 feet on uneven surfaces activity did not occur: Safety/medical concerns         Wheelchair     Assist Will patient use wheelchair at discharge?: Yes Type of Wheelchair: Manual  Wheelchair assist level: Dependent - Patient 0% Max wheelchair distance: 150 ft    Wheelchair 50 feet with 2 turns activity    Assist        Assist Level: Dependent - Patient 0%   Wheelchair 150 feet activity     Assist      Assist Level: Dependent - Patient 0%    Medical Problem List and Plan: 1.  Left-sided weakness secondary to left greater than right subdural hematoma.  Status post cerebral angiogram balloon angioplasty  Continue CIR 2.   Antithrombotics: -DVT/anticoagulation: Lovenox initiated 01/29/2020             -antiplatelet therapy: Aspirin 325 mg daily 3. Pain Management: Oxycodone as needed, Robaxin as needed 4. Mood: Provide emotional support             -antipsychotic agents: N/A  Zoloft daily.  5. Neuropsych: This patient is not capable of making decisions on his own behalf. 6. Skin/Wound Care: Routine skin checks 7. Fluids/Electrolytes/Nutrition: Routine in and outs   Carb modified diet- p.o. intake variable. 8.  Diabetes mellitus with hyperglycemia.  Hemoglobin A1c 7.7.  Check blood sugars before meals and at bedtime.  Patient on Tresiba 35 units daily, Glucotrol 10 mg daily, Actos 45 mg daily, Janumet 50-1000 mg twice daily, NovoLog 75 units daily prior to admission.               Levemir 31U BID  Labile on 5/20, will consider changes when PO intake stabilizes  9.  Seizure prophylaxis. Keppra 500 mg twice daily 10.  Arrhythmia/wide-complex tachycardia.    Amiodarone 200 mg daily x4 weeks (~6/12).  Follow-up cardiology services 11.  Hypertension with orthostasis.  Autoregulate on Nimotop.  Elevated at rest, however orthostasis noted by therapies, will avoid overcorrection   TEDS and abdominal binder as needed  12.  Hyperlipidemia.  Crestor/fenofibrate 13. Mild vasospasm evident on 5/14 vascular transcranial doppler. 14. Spasticity:  Started baclofen 5mg  TID for pain relief and spasticity, changed to nightly due to ?lethargy.  15.  Hyponatremia  Sodium 134 on 5/17, labs ordered for tomorrow  Continue to monitor 16.  Transaminitis  ALT elevated on 5/17  Continue to monitor 17.  Acute blood loss anemia  Hemoglobin 11.5 on 5/17  Continue to monitor   LOS: 5 days A FACE TO FACE EVALUATION WAS PERFORMED  Raissa Dam Lorie Phenix 02/07/2020, 8:17 AM

## 2020-02-07 NOTE — Progress Notes (Signed)
Physical Therapy Session Note  Patient Details  Name: Nicholas [name] MRN: [id number] Date of Birth: 1951-08-15  Today's Date: 02/07/2020 PT Individual Time: 1435-1530 PT Individual Time Calculation (min): 55 min   Short Term Goals: Week 1:  PT Short Term Goal 1 (Week 1): Pt will perform bed<>chair transfer max +2 without lift equipment PT Short Term Goal 2 (Week 1): Pt will perform sit<>stand with max assist +2 PT Short Term Goal 3 (Week 1): Pt will maintain sitting balance statically EOB with CGA x 5 minutes  Skilled Therapeutic Interventions/Progress Updates:    Patient received in bed, fatigued and in need of change. Able to roll with maxA however needed totalA for pericare and doffing soiled pants/donning new ones; continues to require 2 helpers for supine->sit but able to hold sitting balance with MinA at EOB today. Able to perform sliding board transfer to TIS Nicholas [name] with max-totalAx2 for lateral scoot and assist of 3 to scoot hips fully back in chair. Otherwise worked on attention to L side and postural corrections/control by utilizing max VC/TCs for head and eye turn to the left, had patient name which card he was looking at and take it in his left hand, put it in his right then reach far to the right to place it on a table. Required extended time to participate in task but able to do so with Mod-max VC, increased time, and Mod TC/Min-Mod assist. Left up in his TIS Nicholas [name] with son Elliot Gurney providing direct supervision and RN aware of patient status/plan to try to stay up in TIS until 7.   Therapy Documentation Precautions:  Precautions Precautions: None Precaution Comments: Lt hemiparesis with Lt neglect ; watch BP Restrictions Weight Bearing Restrictions: No Other Position/Activity Restrictions: keep BP >160 Pain: Pain Assessment Pain Scale: Faces Faces Pain Scale: Hurts a little bit Pain Type: Acute pain Pain Location: Back Pain Orientation: Lower Pain Descriptors / Indicators:  Aching;Sore Pain Onset: On-going Patients Stated Pain Goal: 0 Pain Intervention(s): Repositioned;Therapeutic touch;Emotional support    Therapy/Group: Individual Therapy   Madelaine Etienne, DPT, PN1   Supplemental Physical Therapist [facility]    Pager [phone] Acute Rehab Office [phone]    02/07/2020, 3:53 PM

## 2020-02-07 NOTE — Progress Notes (Signed)
Physical Therapy Session Note  Patient Details  Name: Nicholas [name] MRN: [id number] Date of Birth: 1951/06/01  Today's Date: 02/07/2020 PT Individual Time: 1120-1200 PT Individual Time Calculation (min): 40 min   Short Term Goals: Week 1:  PT Short Term Goal 1 (Week 1): Pt will perform bed<>chair transfer max +2 without lift equipment PT Short Term Goal 2 (Week 1): Pt will perform sit<>stand with max assist +2 PT Short Term Goal 3 (Week 1): Pt will maintain sitting balance statically EOB with CGA x 5 minutes  Skilled Therapeutic Interventions/Progress Updates:    Patient received in bed in hand-off from OT; required totalA for donning of shirt as well as MaxA for rolling to complete donning shirt in bed today, NT then assisted in repositioning patient in bed as he was quite fatigued overall this morning. Focused session on stretches for lumbar spine as they appear to have significantly reduced back pain however L side remains very tender and hypersensitive to stretches so used caution. Also worked on heel cord stretching as well as cervical stretches to improve rotation to L, also performed activities to assist in improving attention and attending to L side as well. Left in bed with all needs met, bed alarm active this afternoon.   Therapy Documentation Precautions:  Precautions Precautions: None Precaution Comments: Lt hemiparesis with Lt neglect ; watch BP Restrictions Weight Bearing Restrictions: No Other Position/Activity Restrictions: keep BP >160   Pain: Pain Assessment Pain Scale: Faces Faces Pain Scale: No hurt    Therapy/Group: Individual Therapy   Windell Norfolk, DPT, PN1   Supplemental Physical Therapist [place]    Pager [phone] Acute Rehab Office [phone]    02/07/2020, 12:39 PM

## 2020-02-08 ENCOUNTER — Inpatient Hospital Stay (HOSPITAL_COMMUNITY): Payer: BC Managed Care – PPO | Admitting: Speech Pathology

## 2020-02-08 ENCOUNTER — Inpatient Hospital Stay (HOSPITAL_COMMUNITY): Payer: BC Managed Care – PPO | Admitting: Physical Therapy

## 2020-02-08 ENCOUNTER — Inpatient Hospital Stay (HOSPITAL_COMMUNITY): Payer: BC Managed Care – PPO

## 2020-02-08 ENCOUNTER — Inpatient Hospital Stay (HOSPITAL_COMMUNITY): Payer: BC Managed Care – PPO | Admitting: Occupational Therapy

## 2020-02-08 LAB — BASIC METABOLIC PANEL
Anion gap: 11 (ref 5–15)
BUN: 15 mg/dL (ref 8–23)
CO2: 28 mmol/L (ref 22–32)
Calcium: 9 mg/dL (ref 8.9–10.3)
Chloride: 96 mmol/L — ABNORMAL LOW (ref 98–111)
Creatinine, Ser: 0.63 mg/dL (ref 0.61–1.24)
GFR calc Af Amer: >60 mL/min (ref >60)
GFR calc non Af Amer: >60 mL/min (ref >60)
Glucose, Bld: 208 mg/dL — ABNORMAL HIGH (ref 70–99)
Potassium: 3.9 mmol/L (ref 3.5–5.1)
Sodium: 135 mmol/L (ref 135–145)

## 2020-02-08 LAB — GLUCOSE, CAPILLARY
Glucose-Capillary: 66 mg/dL — ABNORMAL LOW (ref 70–99)
Glucose-Capillary: 187 mg/dL — ABNORMAL HIGH (ref 70–99)
Glucose-Capillary: 257 mg/dL — ABNORMAL HIGH (ref 70–99)
Glucose-Capillary: 225 mg/dL — ABNORMAL HIGH (ref 70–99)
Glucose-Capillary: 214 mg/dL — ABNORMAL HIGH (ref 70–99)

## 2020-02-08 IMAGING — DX DG KNEE 1-2V PORT*R*
1 series · 4 of 4 positions shown · non-contrast
Comparison: None.

CLINICAL DATA: Chronic knee pain

EXAM:
PORTABLE RIGHT KNEE - 1-2 VIEW

[Series 1: knee · 0.14mm/px · 4 of 4 slices shown]
[im 1/4]
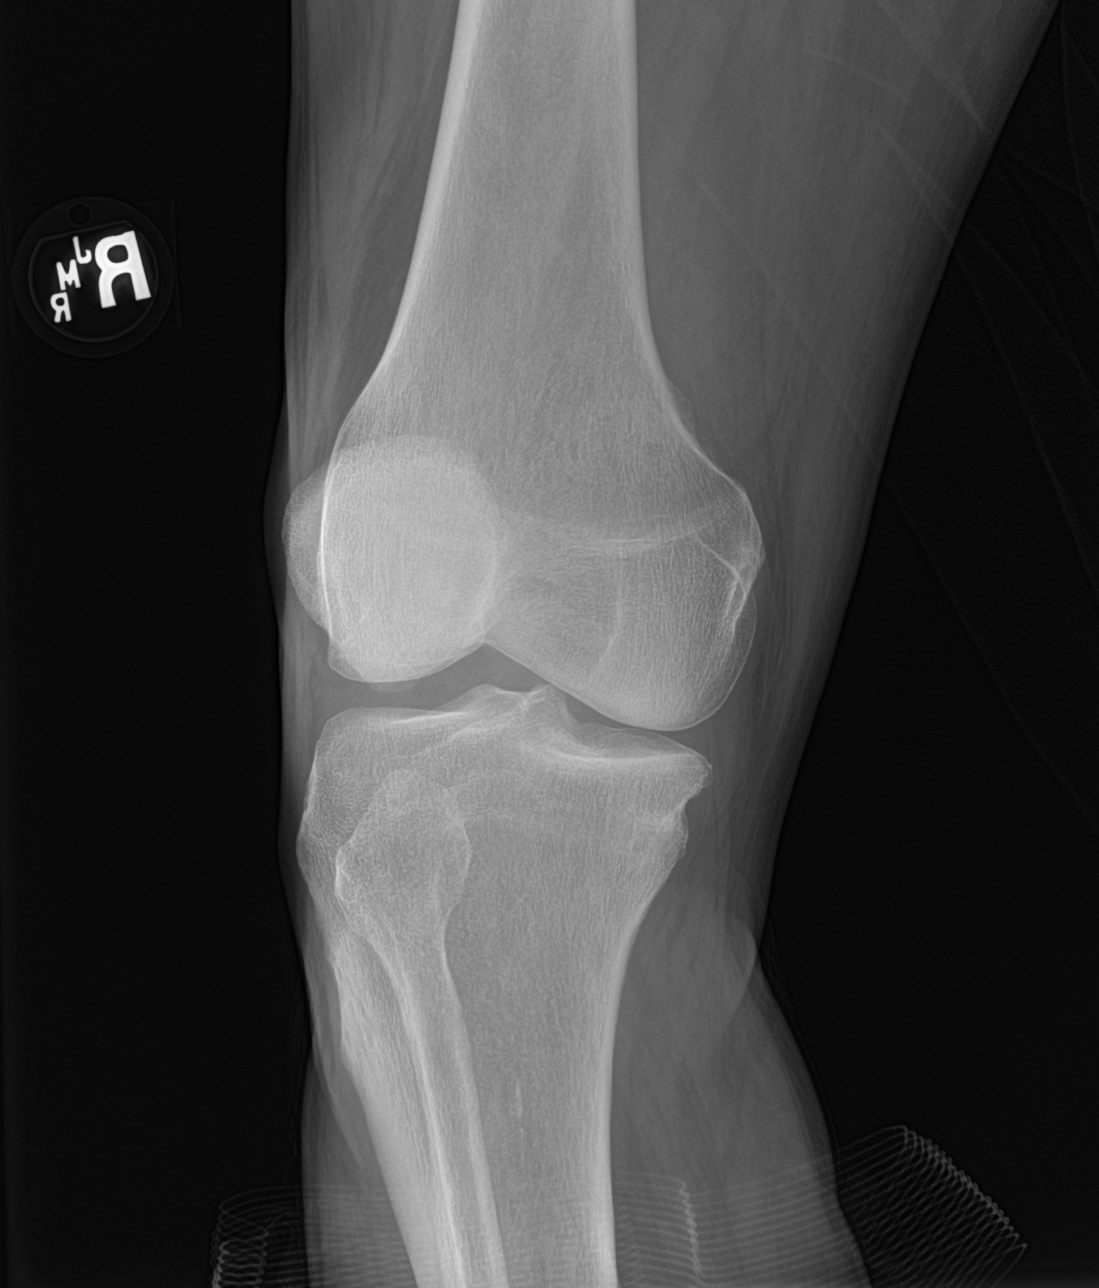
[im 2/4]
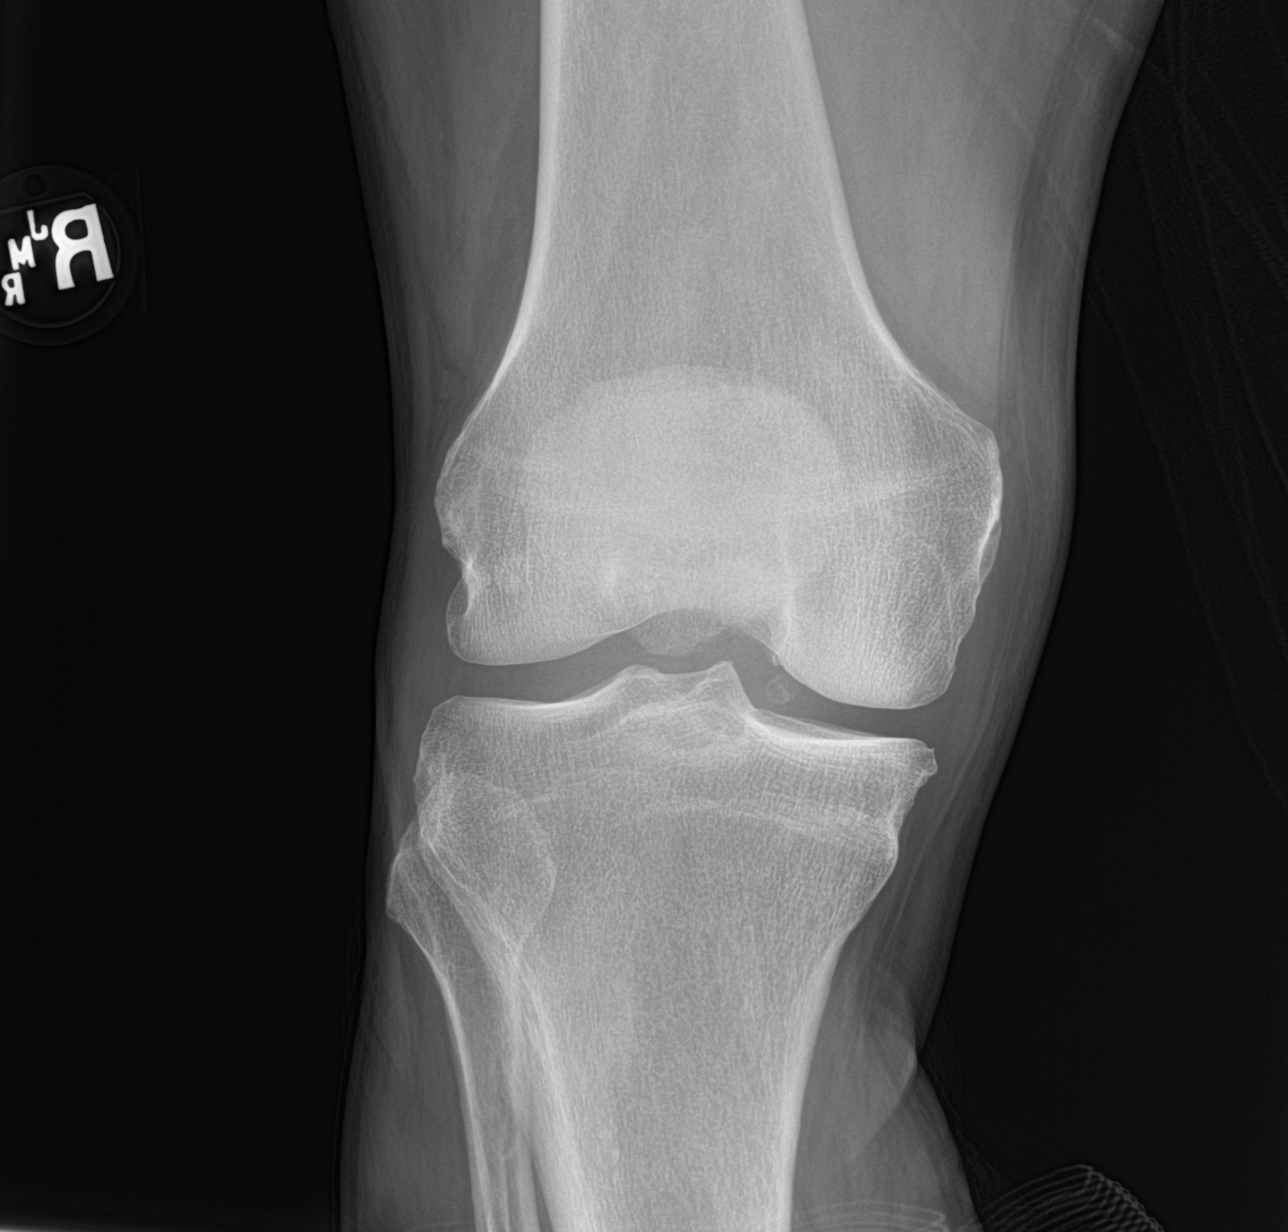
[im 3/4]
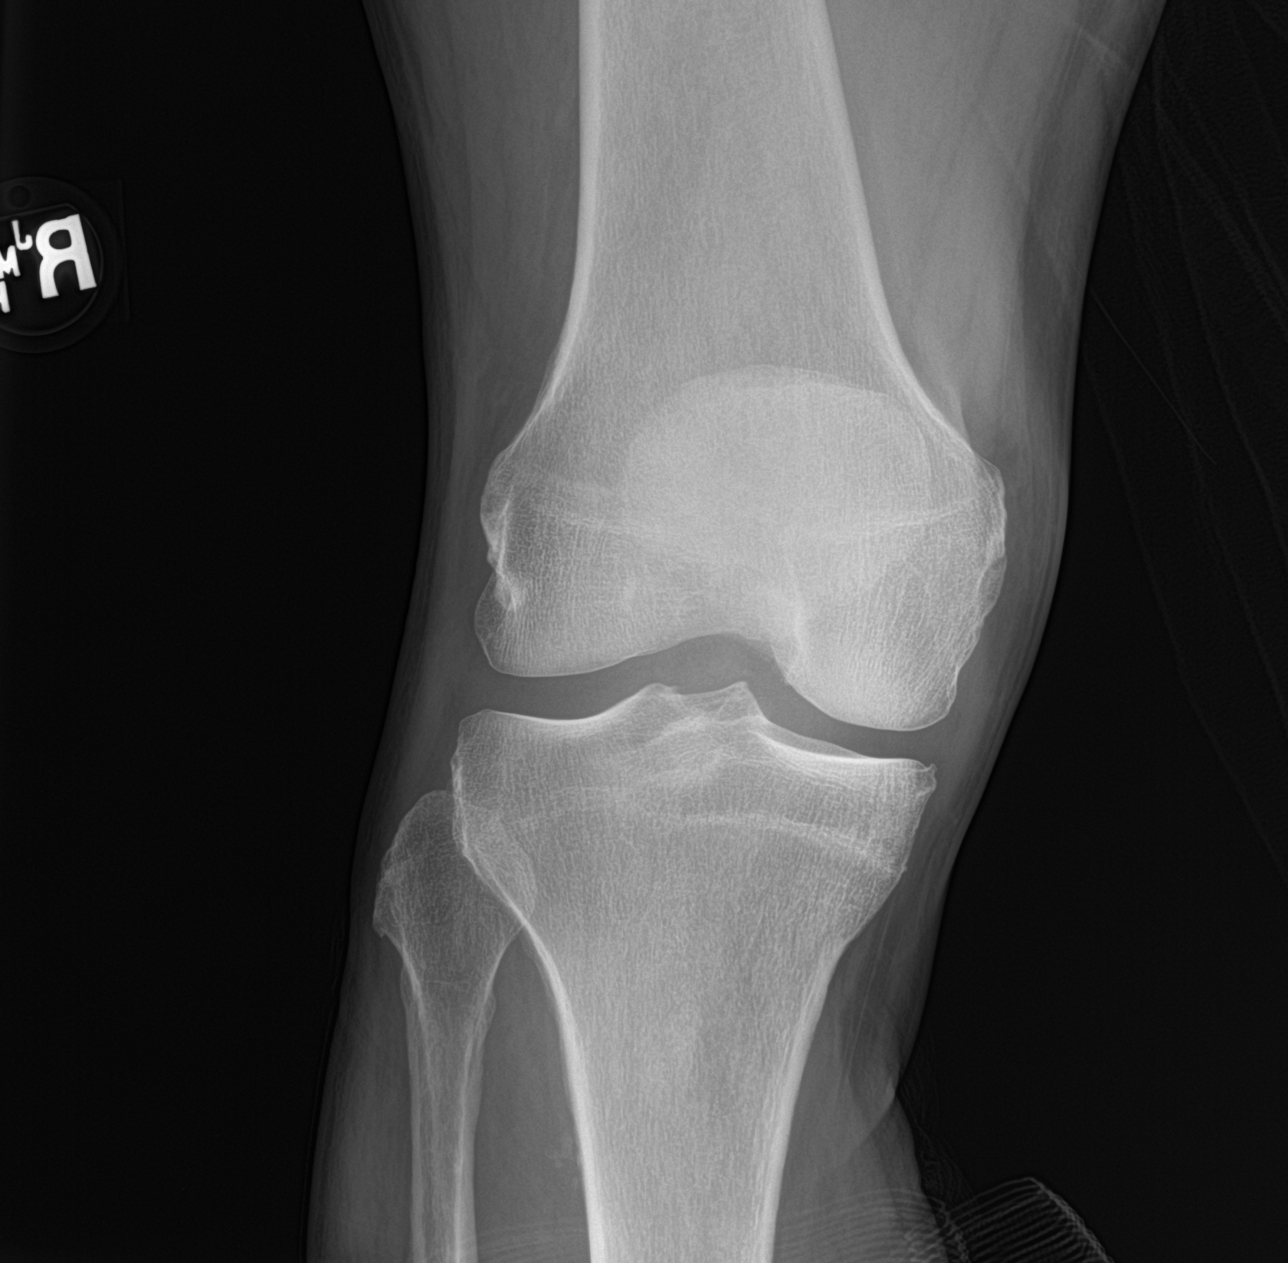
[im 4/4]
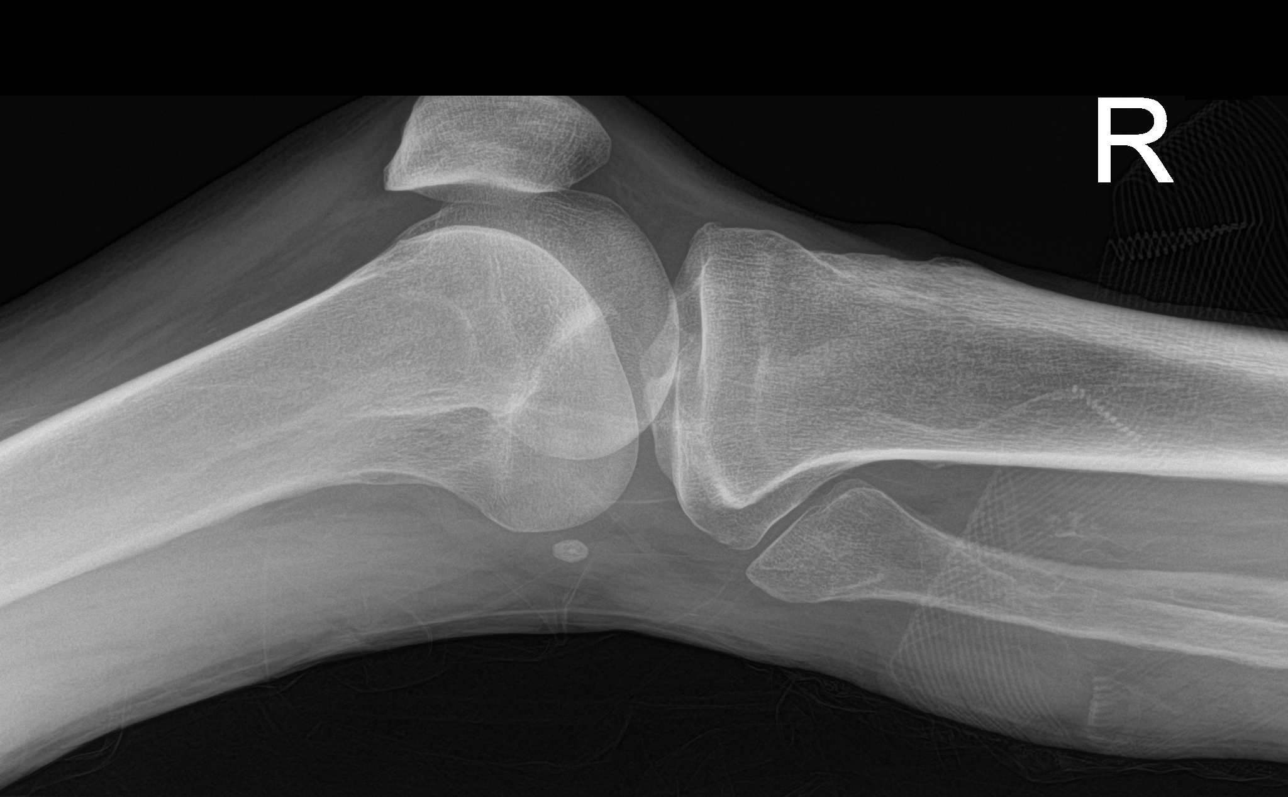

[4 of 4 positions shown; findings below may reference images not displayed]

FINDINGS: No evidence of fracture, dislocation, or joint effusion. No evidence
of arthropathy or other focal bone abnormality. Soft tissues are
unremarkable.
IMPRESSION: No acute abnormality noted.

## 2020-02-08 NOTE — Progress Notes (Signed)
Orthopedic Tech Progress Note Patient Details:  Nicholas [name] [birth year] [id number] Called in order to [facility] for BLE AFO Patient ID: Nicholas [name], male   DOB: [birth year], 69 y.o.   MRN: [id number]   [name] 02/08/2020, 2:01 PM

## 2020-02-08 NOTE — Progress Notes (Addendum)
[place] PHYSICAL MEDICINE & REHABILITATION PROGRESS NOTE   Subjective/Complaints: Does not respond to my questions, but says ouch when therapist is helping him transfer with sliding board. A little restless and anxious overnight and this morning- pulse and respirations elevated so MEWS of 2  ROS: Denies CP, SOB, N/V/D  Objective:   No results found. No results for input(s): WBC, HGB, HCT, PLT in the last 72 hours. No results for input(s): NA, K, CL, CO2, GLUCOSE, BUN, CREATININE, CALCIUM in the last 72 hours.  Intake/Output Summary (Last 24 hours) at 02/08/2020 0829 Last data filed at 02/08/2020 0406 Gross per 24 hour  Intake 600 ml  Output 1500 ml  Net -900 ml     Physical Exam: Vital Signs Blood pressure (!) 147/96, pulse (!) 102, temperature 98.2 F (36.8 C), temperature source Oral, resp. rate 18, height 6' (1.829 m), weight 89.8 kg, SpO2 96 %. Constitutional: No distress . Vital signs reviewed. HENT: Normocephalic.  Atraumatic. Eyes: Right gaze preference.  No discharge. Cardiovascular: No JVD. Respiratory: Normal effort.  No stridor. GI: Non-distended. Skin: Warm and dry.  Intact. Psych: Flat. Musc: No edema in extremities. Sharp posterior right knee pain with ROM Neurological: Alert Left facial droop, stable Right lean Motor: LUE: 3-3+/5 proximal distal with apraxia LLE: 2+/5 proximal distal (?limited due to participation) Right ptosis Difficult to assess tone due to patient resistance Very tight left sided lumbar and cervical paraspinals.   Assessment/Plan: 1. Functional deficits secondary to [facility] which require 3+ hours per day of interdisciplinary therapy in a comprehensive inpatient rehab setting.  Physiatrist is providing close team supervision and 24 hour management of active medical problems listed below.  Physiatrist and rehab team continue to assess barriers to discharge/monitor patient progress toward functional and medical goals  Care  Tool:  Bathing    Body parts bathed by patient: Chest, Abdomen, Front perineal area, Right upper leg, Left upper leg   Body parts bathed by helper: Right arm, Left arm, Chest, Abdomen(UB only)     Bathing assist Assist Level: Maximal Assistance - Patient 24 - 49%     Upper Body Dressing/Undressing Upper body dressing   What is the patient wearing?: Hospital gown only    Upper body assist Assist Level: Maximal Assistance - Patient 25 - 49%    Lower Body Dressing/Undressing Lower body dressing      What is the patient wearing?: Incontinence brief     Lower body assist Assist for lower body dressing: Total Assistance - Patient < 25%     Toileting Toileting    Toileting assist Assist for toileting: Dependent - Patient 0%(Incontinent)     Transfers Chair/bed transfer  Transfers assist     Chair/bed transfer assist level: 2 Helpers     Locomotion Ambulation   Ambulation assist   Ambulation activity did not occur: Safety/medical concerns          Walk 10 feet activity   Assist  Walk 10 feet activity did not occur: Safety/medical concerns        Walk 50 feet activity   Assist Walk 50 feet with 2 turns activity did not occur: Safety/medical concerns         Walk 150 feet activity   Assist Walk 150 feet activity did not occur: Safety/medical concerns         Walk 10 feet on uneven surface  activity   Assist Walk 10 feet on uneven surfaces activity did not occur: Safety/medical concerns  Wheelchair     Assist Will patient use wheelchair at discharge?: Yes Type of Wheelchair: Manual    Wheelchair assist level: Dependent - Patient 0% Max wheelchair distance: 150 ft    Wheelchair 50 feet with 2 turns activity    Assist        Assist Level: Dependent - Patient 0%   Wheelchair 150 feet activity     Assist      Assist Level: Dependent - Patient 0%    Medical Problem List and Plan: 1.  Left-sided  weakness secondary to left greater than right subdural hematoma.  Status post cerebral angiogram balloon angioplasty  Continue CIR 15/17  Bilateral PRAFO ordered 2.  Antithrombotics: -DVT/anticoagulation: Lovenox initiated 01/29/2020             -antiplatelet therapy: Aspirin 325 mg daily 3. Pain Management: Oxycodone as needed, Robaxin as needed. Heating pad to relax tight paraspinals of cervical and lumbar spine  5/21: Sharp posterior right knee pain with ROM: Discussed ordering knee XR but patient refused, 4. Mood: Provide emotional support             -antipsychotic agents: N/A  Zoloft daily.  5. Neuropsych: This patient is not capable of making decisions on his own behalf. 6. Skin/Wound Care: Routine skin checks 7. Fluids/Electrolytes/Nutrition: Routine in and outs   Carb modified diet- p.o. intake variable. 8.  Diabetes mellitus with hyperglycemia.  Hemoglobin A1c 7.7.  Check blood sugars before meals and at bedtime.  Patient on Tresiba 35 units daily, Glucotrol 10 mg daily, Actos 45 mg daily, Janumet 50-1000 mg twice daily, NovoLog 75 units daily prior to admission.               Levemir 31U BID  Labile on 5/20, will consider changes when PO intake stabilizes   5/21: labile.  9.  Seizure prophylaxis. Keppra 500 mg twice daily 10.  Arrhythmia/wide-complex tachycardia.    Amiodarone 200 mg daily x4 weeks (~6/12).  Follow-up cardiology services 11.  Hypertension with orthostasis.  Autoregulate on Nimotop.  Elevated at rest, however orthostasis noted by therapies, will avoid overcorrection   TEDS and abdominal binder as needed  12.  Hyperlipidemia.  Crestor/fenofibrate 13. Mild vasospasm evident on 5/14 vascular transcranial doppler. 14. Spasticity:  Started baclofen 5mg  TID for pain relief and spasticity, changed to nightly due to ?lethargy.  15.  Hyponatremia  Sodium 134 on 5/17, labs ordered for tomorrow  Continue to monitor 16.  Transaminitis  ALT elevated on 5/17  Continue to  monitor 17.  Acute blood loss anemia  Hemoglobin 11.5 on 5/17  Continue to monitor 18. MEWS 2: elevated pulse and respirations on 5/21: monitor vitals closely.    LOS: 6 days A FACE TO FACE EVALUATION WAS PERFORMED  6/21 P Annalia Metzger 02/08/2020, 8:29 AM

## 2020-02-08 NOTE — Progress Notes (Signed)
Speech Language Pathology Daily Session Note  Patient Details  Name: PROCTOR CARRIKER MRN: [id number] Date of Birth: 1950/11/16  Today's Date: 02/08/2020 SLP Individual Time: 1435-1500 SLP Individual Time Calculation (min): 25 min  Short Term Goals: Week 1: SLP Short Term Goal 1 (Week 1): Patient will participate in further assessment of cognitive skills. SLP Short Term Goal 2 (Week 1): Patient will respond to basic yes/no questions targeting wants/needs in 8 out of 10 opportunities with max A. SLP Short Term Goal 3 (Week 1): Patient will follow 2 step directions with 80% accuracy and max A. SLP Short Term Goal 4 (Week 1): Patient will participate in visual scanning tasks scannning left of midline in 5 of 10 opportunities with max A. SLP Short Term Goal 5 (Week 1): Patient will participate in a sustained attention task for 5 minutes with max A.  Skilled Therapeutic Interventions:  Pt was seen for skilled ST targeting cognitive goals.  SLP facilitated the session with a basic peg board task to address visual scanning and sustained attention goals.  Pt removed 10/10 pegs from the board with max assist multimodal cues to maintain attention to and progress through task in light of pt's tendency to perseverate on singular task components.  Pt matched pegs by color with max assist multimodal cues.  He needed max assist to place pegs into a bin on his left side to put away task at the end of today's therapy session.  Pt was left in bed with bed alarm set and family at bedside.  Continue per current plan of care.    Pain Pain Assessment Pain Scale: 0-10 Pain Score: 0-No pain  Therapy/Group: Individual Therapy  Danyia Borunda, Melanee Spry 02/08/2020, 3:35 PM

## 2020-02-08 NOTE — Progress Notes (Signed)
Patient refused knee x-ray, said his knee was not bothering him

## 2020-02-08 NOTE — Plan of Care (Signed)
  Problem: Consults Goal: RH STROKE PATIENT EDUCATION Description: See Patient Education module for education specifics  Outcome: Progressing   Problem: RH BOWEL ELIMINATION Goal: RH STG MANAGE BOWEL W/MEDICATION W/ASSISTANCE Description: STG Manage Bowel with Medication with  Mod Assistance. Outcome: Progressing   Problem: RH BLADDER ELIMINATION Goal: RH STG MANAGE BLADDER WITH ASSISTANCE Description: STG Manage Bladder With Mod Assistance Outcome: Progressing Goal: RH STG MANAGE BLADDER WITH MEDICATION WITH ASSISTANCE Description: STG Manage Bladder With Medication With Mod I Assistance. Outcome: Progressing   Problem: RH SKIN INTEGRITY Goal: RH STG SKIN FREE OF INFECTION/BREAKDOWN Description: Mod I Outcome: Progressing   Problem: RH SAFETY Goal: RH STG ADHERE TO SAFETY PRECAUTIONS W/ASSISTANCE/DEVICE Description: STG Adhere to Safety Precautions With Min Assistance/Device. Outcome: Progressing Goal: RH STG DECREASED RISK OF FALL WITH ASSISTANCE Description: STG Decreased Risk of Fall With Min Assistance. Outcome: Progressing   Problem: RH COGNITION-NURSING Goal: RH STG ANTICIPATES NEEDS/CALLS FOR ASSIST W/ASSIST/CUES Description: STG Anticipates Needs/Calls for Assist With Min  Assistance/Cues. Outcome: Progressing   Problem: RH Vision Goal: RH LTG Vision (Specify) Outcome: Progressing   

## 2020-02-08 NOTE — Progress Notes (Signed)
Physical Therapy Session Note  Patient Details  Name: Nicholas [name] MRN: [id number] Date of Birth: Dec 14, 1950  Today's Date: 02/08/2020 PT Individual Time: 0802-0900 PT Individual Time Calculation (min): 58 min   Short Term Goals: Week 1:  PT Short Term Goal 1 (Week 1): Pt will perform bed<>chair transfer max +2 without lift equipment PT Short Term Goal 2 (Week 1): Pt will perform sit<>stand with max assist +2 PT Short Term Goal 3 (Week 1): Pt will maintain sitting balance statically EOB with CGA x 5 minutes  Skilled Therapeutic Interventions/Progress Updates:    Patient received in bed eating breakfast; assisted in eating more bites of eggs with Max cues for attending to L side while eating and for mouth clearance/swallowing due to perseveratory mastication, made sure food particles were cleared from mouth prior to mobility, then rolled with maxA for brief change/pericare. Continues to require 2 helpers at [facility] for supine to sit transfers as well as for partial stand to clear pedals of stedy when standing from EOB and with great difficulty with upright posture with standing attempts. MD observed transfers and we discussed L sided truncal tone likely contributing to back/neck pain, reported blurry vision with R eye open, and pain behind R knee with passive ROM. Used stedy to pivot to chair, from there went to PT gym and worked on [employer] table with focus on gaining and maintaining upright posture, visual scanning/head turns to L, and reaching to touch cone with L UE. Required Min-ModA for facilitation of upright posture and use of L LE. Also continues to require MaxAx1/MinA of second person (2 helpers total) for SBT to/from chair. BP in chair initially 86/62, applied thigh high TEDS and then 110/63 after TEDS applied. RN present and aware, pt left up in TIS WC in care of RN this morning.   Therapy Documentation Precautions:  Precautions Precautions: None Precaution Comments: Lt hemiparesis with Lt  neglect ; watch BP Restrictions Weight Bearing Restrictions: No Other Position/Activity Restrictions: keep BP >160 Pain: Pain Assessment Pain Scale: Faces Pain Score: 0-No pain Faces Pain Scale: Hurts little more Pain Type: Acute pain Pain Location: Hip Pain Orientation: Left Pain Descriptors / Indicators: Sore Pain Onset: On-going Patients Stated Pain Goal: 0 Pain Intervention(s): Repositioned;Ambulation/increased activity;Heat applied    Therapy/Group: Individual Therapy   Madelaine Etienne, DPT, PN1   Supplemental Physical Therapist [facility]    Pager [phone] Acute Rehab Office [phone]    02/08/2020, 12:09 PM

## 2020-02-08 NOTE — Progress Notes (Signed)
Occupational Therapy Session Note  Patient Details  Name: Nicholas [name] MRN: [id number] Date of Birth: 1950/10/02  Today's Date: 02/08/2020 OT Individual Time: 1173-5670 OT Individual Time Calculation (min): 60 min    Short Term Goals: Week 1:  OT Short Term Goal 1 (Week 1): Pt will maintain static sitting balance (unsupported) with min A in prep for dyanamic sititng balance in ADL with OT Short Term Goal 2 (Week 1): Pt will don shirt with mod A OT Short Term Goal 3 (Week 1): Pt will perform transfer of max A +1 with LRAD method OT Short Term Goal 4 (Week 1): PT will perform bed mobility in prep for ADL tasks with max A+1 (rolling/ supine to sit)  Skilled Therapeutic Interventions/Progress Updates:    Pt received in tilt in space c/o back pain.  Positioned pt more in a full upright position to engage in UE and perceptual tasks, but his pain was still present. Pt agreed to participate in some tasks in sitting for 30 min and then transition to bed to relieve pain.     In sitting worked on doffing old shirt with mod verbal cues but no physical A and donning a new shirt with mod cues and mod A.  Improved ability to follow verbal directions and scanning to left side.  BUE coordination and facilitation of LUE with hands on dowel bar for pushing and pulling exercises. Pt needed frequent cues to fully attend to L hand but did demonstrate improved AROM and follow through with exercises.   Worked on a clock draw task demonstrating severe perceptual impairments of spatial relations and L neglect. Copied a few simple shapes but drew the shapes only on the R side of the paper.    Used slide board to transfer to bed with max A of 2.  From supine, positioned legs with bed and pillows to take pressure off of his back.  Worked on neck ROM to assist pt to with rotation of neck to his L and lateral tilt.     Tried to facilitate LUE AROM with tricep extension, but pt too lethargic.    Wife arrived to room and  provided education on what we are working on in therapy and how to faciliate left visual scanning.  Pt resting in bed with all needs met.  Therapy Documentation Precautions:  Precautions Precautions: None Precaution Comments: Lt hemiparesis with Lt neglect ; watch BP Restrictions Weight Bearing Restrictions: No Other Position/Activity Restrictions: keep BP >160    Vital Signs: Therapy Vitals Temp: 98 F (36.7 C) Pulse Rate: 97 BP: 103/66 Patient Position (if appropriate): Sitting Oxygen Therapy SpO2: 97 % O2 Device: Room Air Pain: Pain Assessment Pain Scale: 0-10 Pain Score: 0-No pain Faces Pain Scale: Hurts whole lot Pain Type: Acute pain Pain Location: Back Pain Orientation: Lower Pain Descriptors / Indicators: Aching Pain Onset: Gradual Pain Intervention(s): Repositioned ADL: ADL Eating: Maximal assistance Grooming: Maximal assistance Upper Body Bathing: Maximal cueing, Moderate assistance Where Assessed-Upper Body Bathing: Shower Lower Body Bathing: Maximal assistance Where Assessed-Lower Body Bathing: Shower Upper Body Dressing: Moderate assistance Where Assessed-Upper Body Dressing: Wheelchair Lower Body Dressing: Dependent Where Assessed-Lower Body Dressing: Bed level Toileting: Not assessed Toilet Transfer: Not assessed Walk-In Shower Transfer: Not assessed Social research officer, government Method: Other (comment)(roll in shower chair) Youth worker: Other (comment)   Therapy/Group: Individual Therapy  [place] 02/08/2020, 11:49 AM

## 2020-02-08 NOTE — Progress Notes (Signed)
Physical Therapy Session Note  Patient Details  Name: Nicholas [name] MRN: [id number] Date of Birth: 02/17/1951  Today's Date: 02/08/2020 PT Individual Time: 1402-1430 PT Individual Time Calculation (min): 28 min   Short Term Goals: Week 1:  PT Short Term Goal 1 (Week 1): Pt will perform bed<>chair transfer max +2 without lift equipment PT Short Term Goal 2 (Week 1): Pt will perform sit<>stand with max assist +2 PT Short Term Goal 3 (Week 1): Pt will maintain sitting balance statically EOB with CGA x 5 minutes  Skilled Therapeutic Interventions/Progress Updates:    patient received in bed finishing changing with NTs, fatigued but willing to participate in therapy today. Focused session on continuing to stretch lumbar musculature and attempt to reduce L-sided truncal tone- continues to have increased discomfort with lumbar stretches especially on L side but able to significantly reduce tone-driven resistance and improve ROM significantly today by going slow and with extended duration stretches. Otherwise educated wife about progress with PT, general prognosis, activities with therapy today, heel cord stretches, and techniques they can use to improve attention to L when they are interacting with him, also importance of self care for herself and their sons. Patient left in bed with all needs met in hand-off to speech therapist.   Therapy Documentation Precautions:  Precautions Precautions: None Precaution Comments: Lt hemiparesis with Lt neglect ; watch BP Restrictions Weight Bearing Restrictions: No Other Position/Activity Restrictions: keep BP >160 Pain: Pain Assessment Pain Scale: Faces Pain Score: 0-No pain Faces Pain Scale: Hurts a little bit Pain Type: Acute pain Pain Location: Back Pain Orientation: Lower Pain Descriptors / Indicators: Sore Pain Onset: On-going Patients Stated Pain Goal: 0 Pain Intervention(s): Repositioned;Emotional support;Ambulation/increased  activity    Therapy/Group: Individual Therapy   Windell Norfolk, DPT, PN1   Supplemental Physical Therapist [facility]    Pager [phone] Acute Rehab Office [phone]    02/08/2020, 3:44 PM

## 2020-02-08 NOTE — Progress Notes (Signed)
Pt is anxious this morning fidgeting with remote and pressing call bell but not cognitively aware of what he is doing. Pt attempting to climb out of bed by throwing legs over side rail. Order placed for tele-monitoring due to safety concerns.

## 2020-02-08 NOTE — Plan of Care (Signed)
As per discussion with therapy team and attending CIR physician, updating plan of care to 15/7 schedule.   Madelaine Etienne, DPT, PN1   Supplemental Physical Therapist [facility]    Pager [phone] Acute Rehab Office [phone]

## 2020-02-09 ENCOUNTER — Inpatient Hospital Stay (HOSPITAL_COMMUNITY): Payer: BC Managed Care – PPO | Admitting: Physical Therapy

## 2020-02-09 ENCOUNTER — Inpatient Hospital Stay (HOSPITAL_COMMUNITY): Payer: BC Managed Care – PPO

## 2020-02-09 ENCOUNTER — Inpatient Hospital Stay (HOSPITAL_COMMUNITY): Payer: BC Managed Care – PPO | Admitting: Speech Pathology

## 2020-02-09 LAB — CREATININE, SERUM
Creatinine, Ser: 0.66 mg/dL (ref 0.61–1.24)
GFR calc Af Amer: >60 mL/min (ref >60)
GFR calc non Af Amer: >60 mL/min (ref >60)

## 2020-02-09 LAB — GLUCOSE, CAPILLARY
Glucose-Capillary: 74 mg/dL (ref 70–99)
Glucose-Capillary: 190 mg/dL — ABNORMAL HIGH (ref 70–99)
Glucose-Capillary: 265 mg/dL — ABNORMAL HIGH (ref 70–99)
Glucose-Capillary: 285 mg/dL — ABNORMAL HIGH (ref 70–99)

## 2020-02-09 MED ORDER — HYDROCODONE-ACETAMINOPHEN 7.5-325 MG PO TABS
1.0000 | ORAL_TABLET | ORAL | Status: DC | PRN
  Administered 2020-02-10 – 2020-02-25 (×15): 1 via ORAL
  Filled 2020-02-09 (×19): qty 1

## 2020-02-09 NOTE — Progress Notes (Signed)
Physical Therapy Session Note  Patient Details  Name: Nicholas [name] MRN: [id number] Date of Birth: 1951-01-25  Today's Date: 02/09/2020 PT Individual Time: 1300-1400 PT Individual Time Calculation (min): 60 min   Short Term Goals: Week 1:  PT Short Term Goal 1 (Week 1): Pt will perform bed<>chair transfer max +2 without lift equipment PT Short Term Goal 2 (Week 1): Pt will perform sit<>stand with max assist +2 PT Short Term Goal 3 (Week 1): Pt will maintain sitting balance statically EOB with CGA x 5 minutes  Skilled Therapeutic Interventions/Progress Updates:  Pt was seen bedside in the pm. Pt sitting up in tilt in space w/c. Pt transferred w/c to edge of bed with squat pivot transfers to right with 2 helpers for safety. Pt completed transfer with max A and verbal cues. Pt transferred edge of bed to supine with total A. Pt rolled L with mod A and R with total A and verbal cues. Dependent to don pants. Pt transferred supine to edge of bed with max A and verbal cues. Pt transferred edge of bed to w/c to L side with 2 helpers for safety. Pt completed transfer with max to total A and verbal cues. Pt transported to rehab gym. Pt stood in parallel bars x 5 with mod to max A and verbal cues. While standing focused on upright posture and midline. Pt returned to room following treatment and left sitting up in tilt in space chair with all needs within reach and son at bedside.   Therapy Documentation Precautions:  Precautions Precautions: None Precaution Comments: Lt hemiparesis with Lt neglect ; watch BP Restrictions Weight Bearing Restrictions: No Other Position/Activity Restrictions: keep BP >160 General:   Pain: No c/o pain.   Therapy/Group: Individual Therapy  Rayford Halsted 02/09/2020, 2:20 PM

## 2020-02-09 NOTE — Progress Notes (Signed)
[place] PHYSICAL MEDICINE & REHABILITATION PROGRESS NOTE   Subjective/Complaints:   Pt did not make any sounds/speech when I was there, however son at bedside- told me pt was c/o back pain- balance between pain control and sedation has been difficult.   Pt admitted he was watching me- L eye open- R eye closed.   No complaints of R knee pain today.   ROS:  Pt denies SOB, abd pain, CP, N/V/C/D, and vision changes   Objective:   DG Knee Right Port  Result Date: 02/09/2020 CLINICAL DATA:  Chronic knee pain EXAM: PORTABLE RIGHT KNEE - 1-2 VIEW COMPARISON:  None. FINDINGS: No evidence of fracture, dislocation, or joint effusion. No evidence of arthropathy or other focal bone abnormality. Soft tissues are unremarkable. IMPRESSION: No acute abnormality noted. Electronically Signed   By: Inez Catalina M.D.   On: 02/09/2020 14:37   No results for input(s): WBC, HGB, HCT, PLT in the last 72 hours. Recent Labs    02/08/20 0729 02/09/20 0540  NA 135  --   K 3.9  --   CL 96*  --   CO2 28  --   GLUCOSE 208*  --   BUN 15  --   CREATININE 0.63 0.66  CALCIUM 9.0  --     Intake/Output Summary (Last 24 hours) at 02/09/2020 1516 Last data filed at 02/09/2020 0911 Gross per 24 hour  Intake 540 ml  Output 1120 ml  Net -580 ml     Physical Exam: Vital Signs Blood pressure 127/65, pulse 92, temperature 98 F (36.7 C), temperature source Oral, resp. rate 17, height 6' (1.829 m), weight 89.8 kg, SpO2 98 %. Constitutional: No distress . Vital signs reviewed.appeared asleep, but then watching me; son at bedside- spoke for pt, NAD HENT: Normocephalic.  Atraumatic. Eyes: R eye closed- L eye 3/4 open- watching me on R Cardiovascular: No JVD Respiratory: CTA B/L GI: Soft, NT, ND, (+)BS  Skin: Warm and dry.  Intact. Psych: very flat affect Musc: No edema in extremities. C/o back pain- TTP- no compaints of R knee pain Neurological: somewhat alert Left facial droop, stable Right lean Motor:  LUE: 3-3+/5 proximal distal with apraxia LLE: 2+/5 proximal distal (?limited due to participation) Right ptosis Difficult to assess tone due to patient resistance Very tight left sided lumbar and cervical paraspinals.   Assessment/Plan: 1. Functional deficits secondary to [facility] which require 3+ hours per day of interdisciplinary therapy in a comprehensive inpatient rehab setting.  Physiatrist is providing close team supervision and 24 hour management of active medical problems listed below.  Physiatrist and rehab team continue to assess barriers to discharge/monitor patient progress toward functional and medical goals  Care Tool:  Bathing    Body parts bathed by patient: Chest, Abdomen, Front perineal area, Right upper leg, Left upper leg   Body parts bathed by helper: Right arm, Left arm, Chest, Abdomen(UB only)     Bathing assist Assist Level: Maximal Assistance - Patient 24 - 49%     Upper Body Dressing/Undressing Upper body dressing   What is the patient wearing?: Pull over shirt    Upper body assist Assist Level: Moderate Assistance - Patient 50 - 74%    Lower Body Dressing/Undressing Lower body dressing      What is the patient wearing?: Incontinence brief     Lower body assist Assist for lower body dressing: Total Assistance - Patient < 25%     Toileting Toileting    Toileting assist Assist  for toileting: Dependent - Patient 0%(Incontinent)     Transfers Chair/bed transfer  Transfers assist     Chair/bed transfer assist level: 2 Helpers     Locomotion Ambulation   Ambulation assist   Ambulation activity did not occur: Safety/medical concerns          Walk 10 feet activity   Assist  Walk 10 feet activity did not occur: Safety/medical concerns        Walk 50 feet activity   Assist Walk 50 feet with 2 turns activity did not occur: Safety/medical concerns         Walk 150 feet activity   Assist Walk 150 feet activity did not  occur: Safety/medical concerns         Walk 10 feet on uneven surface  activity   Assist Walk 10 feet on uneven surfaces activity did not occur: Safety/medical concerns         Wheelchair     Assist Will patient use wheelchair at discharge?: Yes Type of Wheelchair: Manual    Wheelchair assist level: Dependent - Patient 0% Max wheelchair distance: 150 ft    Wheelchair 50 feet with 2 turns activity    Assist        Assist Level: Dependent - Patient 0%   Wheelchair 150 feet activity     Assist      Assist Level: Dependent - Patient 0%    Medical Problem List and Plan: 1.  Left-sided weakness secondary to left greater than right subdural hematoma.  Status post cerebral angiogram balloon angioplasty  Continue CIR 15/17  Bilateral PRAFO ordered 2.  Antithrombotics: -DVT/anticoagulation: Lovenox initiated 01/29/2020             -antiplatelet therapy: Aspirin 325 mg daily 3. Pain Management: Oxycodone as needed, Robaxin as needed. Heating pad to relax tight paraspinals of cervical and lumbar spine  5/21: Sharp posterior right knee pain with ROM: Discussed ordering knee XR but patient refused,  5/22- R knee xray is normal  -will change Oxycodone to Norco 7.5 mg/325 mg q4 hours per son request since been so sedated. 4. Mood: Provide emotional support             -antipsychotic agents: N/A  Zoloft daily.  5. Neuropsych: This patient is not capable of making decisions on his own behalf. 6. Skin/Wound Care: Routine skin checks 7. Fluids/Electrolytes/Nutrition: Routine in and outs   Carb modified diet- p.o. intake variable. 8.  Diabetes mellitus with hyperglycemia.  Hemoglobin A1c 7.7.  Check blood sugars before meals and at bedtime.  Patient on Tresiba 35 units daily, Glucotrol 10 mg daily, Actos 45 mg daily, Janumet 50-1000 mg twice daily, NovoLog 75 units daily prior to admission.               Levemir 31U BID  Labile on 5/20, will consider changes when PO  intake stabilizes    CBG (last 3)  Recent Labs    02/08/20 2116 02/09/20 0644 02/09/20 1123  GLUCAP 214* 74 190*    5/22- variable BGS- 74-214- better than had been- will monitor- as above.  9.  Seizure prophylaxis. Keppra 500 mg twice daily 10.  Arrhythmia/wide-complex tachycardia.    Amiodarone 200 mg daily x4 weeks (~6/12).  Follow-up cardiology services 11.  Hypertension with orthostasis.  Autoregulate on Nimotop.  Elevated at rest, however orthostasis noted by therapies, will avoid overcorrection   TEDS and abdominal binder as needed  12.  Hyperlipidemia.  Crestor/fenofibrate 13. Mild vasospasm  evident on 5/14 vascular transcranial doppler. 14. Spasticity:  Started baclofen 5mg  TID for pain relief and spasticity, changed to nightly due to ?lethargy.  15.  Hyponatremia  Sodium 134 on 5/17, labs ordered for tomorrow  Continue to monitor 16.  Transaminitis  ALT elevated on 5/17  Continue to monitor 17.  Acute blood loss anemia  Hemoglobin 11.5 on 5/17  Continue to monitor 18. MEWS 2: elevated pulse and respirations on 5/21: monitor vitals closely.   5/22- much improved today-w ill monitor  LOS: 7 days A FACE TO FACE EVALUATION WAS PERFORMED  Bindi Klomp 02/09/2020, 3:16 PM

## 2020-02-09 NOTE — Progress Notes (Signed)
Occupational Therapy Session Note  Patient Details  Name: Nicholas [name] MRN: [id number] Date of Birth: [birth year]  Today's Date: 02/09/2020 OT Individual Time: 0800-0910 OT Individual Time Calculation (min): 70 min    Short Term Goals: Week 1:  OT Short Term Goal 1 (Week 1): Pt will maintain static sitting balance (unsupported) with min A in prep for dyanamic sititng balance in ADL with OT Short Term Goal 2 (Week 1): Pt will don shirt with mod A OT Short Term Goal 3 (Week 1): Pt will perform transfer of max A +1 with LRAD method OT Short Term Goal 4 (Week 1): PT will perform bed mobility in prep for ADL tasks with max A+1 (rolling/ supine to sit)  Skilled Therapeutic Interventions/Progress Updates:    1:1. Pt received in bed agreeable to OT. Pt with incontinent bladder movement despite exernal catheter suction bag therefore rolls with MAX A of 1/dependent peri care of two with VC/TC for reaching with BUE to bed rail and sequnce LEs for rolling in B directions (same for BM incontinence at end of session). LB clothing donned with total A rolling in B directions as above. Pt completes supine>sitting with total A +2 and lateral scoot across SB EOB<>w/c<>EOM with MAX A +2 and pt RUE around OT to decrease pushing. VC for pt elbow on R armrest for midline orientation/anchor and shirt donned MOD A using hemi technique. Pt sits EOB on wedges with towel roll along spine to open up across chest and facilitate neutral thoracic spine v kyphosis. Smell of BM therefore pt escorted back to room and returns to bed for dependent peri care of 2. Exited sesion with tp seated in bed, exit alarm on and call light tin reach  Therapy Documentation Precautions:  Precautions Precautions: None Precaution Comments: Lt hemiparesis with Lt neglect ; watch BP Restrictions Weight Bearing Restrictions: No Other Position/Activity Restrictions: keep BP >160 General:   Vital Signs:  Pain:   ADL: ADL Eating: Maximal  assistance Grooming: Maximal assistance Upper Body Bathing: Maximal cueing, Moderate assistance Where Assessed-Upper Body Bathing: Shower Lower Body Bathing: Maximal assistance Where Assessed-Lower Body Bathing: Shower Upper Body Dressing: Moderate assistance Where Assessed-Upper Body Dressing: Wheelchair Lower Body Dressing: Dependent Where Assessed-Lower Body Dressing: Bed level Toileting: Not assessed Toilet Transfer: Not assessed Walk-In Shower Transfer: Not assessed Film/video editor Method: Other (comment)(roll in shower chair) Astronomer: Other (comment) Vision   Perception    Praxis   Exercises:   Other Treatments:     Therapy/Group: Individual Therapy  Shon Hale 02/09/2020, 9:12 AM

## 2020-02-10 ENCOUNTER — Inpatient Hospital Stay (HOSPITAL_COMMUNITY): Payer: BC Managed Care – PPO | Admitting: Physical Therapy

## 2020-02-10 ENCOUNTER — Inpatient Hospital Stay (HOSPITAL_COMMUNITY): Payer: BC Managed Care – PPO | Admitting: Occupational Therapy

## 2020-02-10 LAB — GLUCOSE, CAPILLARY
Glucose-Capillary: 78 mg/dL (ref 70–99)
Glucose-Capillary: 79 mg/dL (ref 70–99)
Glucose-Capillary: 94 mg/dL (ref 70–99)
Glucose-Capillary: 156 mg/dL — ABNORMAL HIGH (ref 70–99)

## 2020-02-10 NOTE — Progress Notes (Signed)
Occupational Therapy Session Note  Patient Details  Name: Nicholas [name] MRN: [id number] Date of Birth: [birth year]  Today's Date: 02/10/2020 OT Individual Time: 1400-1427 OT Individual Time Calculation (min): 27 min   Short Term Goals: Week 1:  OT Short Term Goal 1 (Week 1): Pt will maintain static sitting balance (unsupported) with min A in prep for dyanamic sititng balance in ADL with OT Short Term Goal 2 (Week 1): Pt will don shirt with mod A OT Short Term Goal 3 (Week 1): Pt will perform transfer of max A +1 with LRAD method OT Short Term Goal 4 (Week 1): PT will perform bed mobility in prep for ADL tasks with max A+1 (rolling/ supine to sit)  Skilled Therapeutic Interventions/Progress Updates:    Pt greeted in bed, asleep, opened one eye when called by name and only kept one eye open throughout tx. Stated he felt "okay," unable to verbalize the name of his wife sitting beside him. Tx focus placed on caregiver education with wife, Corrie Dandy. We first discussed importance of daily ROM for the Lt UE in regards to contracture prevention, Lt attention, and preservation of functional ranges. After OT demonstrated techniques, Corrie Dandy had hands on practice with assisting pt with active assisted and passive ROM. Worked on counting aloud with pt to 10 while holding gentle stretches in end ranges and encouraging visual attendance to affected limb. Corrie Dandy reported that she was afraid to move his Lt arm due to fear of hurting him. Discussed symptoms of pain to watch for and ways to modify exercises if pt did show s/s pain. [facility] appreciative. Pt was then placed in partial sidelying using wedges and props, as Corrie Dandy stated that pt was c/o buttocks soreness earlier. Left pt with all needs within reach and 4 bedrails up.     Therapy Documentation Precautions:  Precautions Precautions: None Precaution Comments: Lt hemiparesis with Lt neglect ; watch BP Restrictions Weight Bearing Restrictions: No Other Position/Activity  Restrictions: keep BP >160 Vital Signs: Therapy Vitals Temp: 97.6 F (36.4 C) Temp Source: Oral Pulse Rate: 90 Resp: 20 BP: 115/62 Patient Position (if appropriate): Lying Oxygen Therapy SpO2: 95 % O2 Device: Room Air Pain:   ADL: ADL Eating: Maximal assistance Grooming: Maximal assistance Upper Body Bathing: Maximal cueing, Moderate assistance Where Assessed-Upper Body Bathing: Shower Lower Body Bathing: Maximal assistance Where Assessed-Lower Body Bathing: Shower Upper Body Dressing: Moderate assistance Where Assessed-Upper Body Dressing: Wheelchair Lower Body Dressing: Dependent Where Assessed-Lower Body Dressing: Bed level Toileting: Not assessed Toilet Transfer: Not assessed Walk-In Shower Transfer: Not assessed Film/video editor Method: Other (comment)(roll in shower chair) Astronomer: Other (comment)      Therapy/Group: Individual Therapy  Michaela A Hoffman 02/10/2020, 4:45 PM

## 2020-02-10 NOTE — Progress Notes (Signed)
Physical Therapy Session Note  Patient Details  Name: Nicholas [name] MRN: [id number] Date of Birth: Sep 27, 1950  Today's Date: 02/10/2020 PT Individual Time: 3614-4315 PT Individual Time Calculation (min): 59 min   Short Term Goals: Week 1:  PT Short Term Goal 1 (Week 1): Pt will perform bed<>chair transfer max +2 without lift equipment PT Short Term Goal 2 (Week 1): Pt will perform sit<>stand with max assist +2 PT Short Term Goal 3 (Week 1): Pt will maintain sitting balance statically EOB with CGA x 5 minutes  Skilled Therapeutic Interventions/Progress Updates:  Pt was seen bedside in the am. Pt performed multiple transfers to don brief and pants. Pt rolled R with mod A and side rail. Pt rolled L with max A and side rail. Pt dependent to don pants, TED hoses, socks and shoes. Pt's BP prior to out of bed was 101/57, HR 91 and O2 sat 97%. Pt transferred supine to edge of bed with max A and verbal cues. Pt transferred edge of bed to w/c with squat pivot transfer max A and verbal cues. Pt transported to rehab gym. Pt transferred w/c to mat with squat pivot transfer and max A with verbal cues. Pt tolerated mat about 10 minutes with min to mod A and verbal cues. While on edge of mat focused on midline and upright posture. Pt transferred back to w/c with max to total A and verbal cues. Pt returned to room and left sitting up in w/c with seat belt in place and all needs within reach.   Therapy Documentation Precautions:  Precautions Precautions: None Precaution Comments: Lt hemiparesis with Lt neglect ; watch BP Restrictions Weight Bearing Restrictions: No Other Position/Activity Restrictions: keep BP >160 General:   Pain: No c/o pain at rest bit c/o low back pain with movement.   Therapy/Group: Individual Therapy  Rayford Halsted 02/10/2020, 12:19 PM

## 2020-02-10 NOTE — Progress Notes (Signed)
Physical Therapy Session Note  Patient Details  Name: Nicholas [name] MRN: [id number] Date of Birth: 1950/12/24  Today's Date: 02/10/2020 PT Individual Time: 1445-1500 PT Individual Time Calculation (min): 15 min   Short Term Goals: Week 1:  PT Short Term Goal 1 (Week 1): Pt will perform bed<>chair transfer max +2 without lift equipment PT Short Term Goal 2 (Week 1): Pt will perform sit<>stand with max assist +2 PT Short Term Goal 3 (Week 1): Pt will maintain sitting balance statically EOB with CGA x 5 minutes  Skilled Therapeutic Interventions/Progress Updates:  Pt was seen bedside in the pm. Pt sleeping in bed. Pt would arouse to verbal stimuli however difficult to maintain due to fatigue. Pt would assist with ROM R LE however quickly drifted back off. LE exercises: 2 sets x 10 reps each: heel slides, hip abd/add, SAQs. Pt left sitting up in bed with all needs within reach.   Therapy Documentation Precautions:  Precautions Precautions: None Precaution Comments: Lt hemiparesis with Lt neglect ; watch BP Restrictions Weight Bearing Restrictions: No Other Position/Activity Restrictions: keep BP >160 General: PT Amount of Missed Time (min): 30 Minutes PT Missed Treatment Reason: Patient fatigue Pain: No c/o pain.   Therapy/Group: Individual Therapy  Rayford Halsted 02/10/2020, 3:08 PM

## 2020-02-10 NOTE — Progress Notes (Signed)
[facility] PHYSICAL MEDICINE & REHABILITATION PROGRESS NOTE   Subjective/Complaints:   Pt  Said specifically "back seems OK" today however he complained of a R frontalis HA-  Said bowels are working- denies constipation.    ROS:   Pt denies SOB, abd pain, CP, N/V/C/D, and vision changes  Objective:   DG Knee Right Port  Result Date: 02/09/2020 CLINICAL DATA:  Chronic knee pain EXAM: PORTABLE RIGHT KNEE - 1-2 VIEW COMPARISON:  None. FINDINGS: No evidence of fracture, dislocation, or joint effusion. No evidence of arthropathy or other focal bone abnormality. Soft tissues are unremarkable. IMPRESSION: No acute abnormality noted. Electronically Signed   By: Alcide Clever M.D.   On: 02/09/2020 14:37   No results for input(s): WBC, HGB, HCT, PLT in the last 72 hours. Recent Labs    02/08/20 0729 02/09/20 0540  NA 135  --   K 3.9  --   CL 96*  --   CO2 28  --   GLUCOSE 208*  --   BUN 15  --   CREATININE 0.63 0.66  CALCIUM 9.0  --     Intake/Output Summary (Last 24 hours) at 02/10/2020 1341 Last data filed at 02/09/2020 1846 Gross per 24 hour  Intake 300 ml  Output -  Net 300 ml     Physical Exam: Vital Signs Blood pressure 139/80, pulse 88, temperature 97.7 F (36.5 C), temperature source Oral, resp. rate 19, height 6' (1.829 m), weight 89.8 kg, SpO2 97 %. Constitutional: laying supine in bed- watched me with 1 eye open, NAD HENT: Normocephalic.  Atraumatic. Eyes: R eye closed; L eye completely open, watching me Cardiovascular: RRR Respiratory: CTA B/L- no W/R/R- good air movement GI: Soft, NT, ND, (+)BS  Skin: Warm and dry.  Intact. Psych: speaking more- flat affect- son not at bedside today Musc: No edema in extremities. C/o back pain- TTP- no compaints of R knee pain Neurological: somewhat alert L facial droop notable Right lean Motor: LUE: 3-3+/5 proximal distal with apraxia LLE: 2+/5 proximal distal (?limited due to participation) Difficult to assess tone due  to patient resistance Very tight left sided lumbar and cervical paraspinals- still- more awake   Assessment/Plan: 1. Functional deficits secondary to [facility] which require 3+ hours per day of interdisciplinary therapy in a comprehensive inpatient rehab setting.  Physiatrist is providing close team supervision and 24 hour management of active medical problems listed below.  Physiatrist and rehab team continue to assess barriers to discharge/monitor patient progress toward functional and medical goals  Care Tool:  Bathing    Body parts bathed by patient: Chest, Abdomen, Front perineal area, Right upper leg, Left upper leg   Body parts bathed by helper: Right arm, Left arm, Chest, Abdomen(UB only)     Bathing assist Assist Level: Maximal Assistance - Patient 24 - 49%     Upper Body Dressing/Undressing Upper body dressing   What is the patient wearing?: Pull over shirt    Upper body assist Assist Level: Moderate Assistance - Patient 50 - 74%    Lower Body Dressing/Undressing Lower body dressing      What is the patient wearing?: Incontinence brief     Lower body assist Assist for lower body dressing: Total Assistance - Patient < 25%     Toileting Toileting    Toileting assist Assist for toileting: Dependent - Patient 0%(Incontinent)     Transfers Chair/bed transfer  Transfers assist     Chair/bed transfer assist level: Total Assistance - Patient <  25%     Locomotion Ambulation   Ambulation assist   Ambulation activity did not occur: Safety/medical concerns          Walk 10 feet activity   Assist  Walk 10 feet activity did not occur: Safety/medical concerns        Walk 50 feet activity   Assist Walk 50 feet with 2 turns activity did not occur: Safety/medical concerns         Walk 150 feet activity   Assist Walk 150 feet activity did not occur: Safety/medical concerns         Walk 10 feet on uneven surface  activity   Assist Walk  10 feet on uneven surfaces activity did not occur: Safety/medical concerns         Wheelchair     Assist Will patient use wheelchair at discharge?: Yes Type of Wheelchair: Manual    Wheelchair assist level: Dependent - Patient 0% Max wheelchair distance: 150 ft    Wheelchair 50 feet with 2 turns activity    Assist        Assist Level: Dependent - Patient 0%   Wheelchair 150 feet activity     Assist      Assist Level: Dependent - Patient 0%    Medical Problem List and Plan: 1.  Left-sided weakness secondary to left greater than right subdural hematoma.  Status post cerebral angiogram balloon angioplasty  Continue CIR 15/17  Bilateral PRAFO ordered 2.  Antithrombotics: -DVT/anticoagulation: Lovenox initiated 01/29/2020             -antiplatelet therapy: Aspirin 325 mg daily 3. Pain Management: Oxycodone as needed, Robaxin as needed. Heating pad to relax tight paraspinals of cervical and lumbar spine  5/21: Sharp posterior right knee pain with ROM: Discussed ordering knee XR but patient refused,  5/22- R knee xray is normal  -will change Oxycodone to Norco 7.5 mg/325 mg q4 hours per son request since been so sedated.  5/23- pt reports pain OK today- so no reduction of pain control changing to Norco  4. Mood: Provide emotional support             -antipsychotic agents: N/A  Zoloft daily.  5. Neuropsych: This patient is not capable of making decisions on his own behalf. 6. Skin/Wound Care: Routine skin checks 7. Fluids/Electrolytes/Nutrition: Routine in and outs   Carb modified diet- p.o. intake variable. 8.  Diabetes mellitus with hyperglycemia.  Hemoglobin A1c 7.7.  Check blood sugars before meals and at bedtime.  Patient on Tresiba 35 units daily, Glucotrol 10 mg daily, Actos 45 mg daily, Janumet 50-1000 mg twice daily, NovoLog 75 units daily prior to admission.               Levemir 31U BID  Labile on 5/20, will consider changes when PO intake stabilizes     CBG (last 3)  Recent Labs    02/09/20 2114 02/10/20 0634 02/10/20 1133  GLUCAP 285* 78 79    5/23- BGs variable- 78/79 and 285- depending on how much he eats- cannot increase insulin to compensate to eating more stable 9.  Seizure prophylaxis. Keppra 500 mg twice daily 10.  Arrhythmia/wide-complex tachycardia.    Amiodarone 200 mg daily x4 weeks (~6/12).  Follow-up cardiology services 11.  Hypertension with orthostasis.  Autoregulate on Nimotop.  Elevated at rest, however orthostasis noted by therapies, will avoid overcorrection   TEDS and abdominal binder as needed  12.  Hyperlipidemia.  Crestor/fenofibrate 13. Mild vasospasm  evident on 5/14 vascular transcranial doppler. 14. Spasticity:  Started baclofen 5mg  TID for pain relief and spasticity, changed to nightly due to ?lethargy.  15.  Hyponatremia  Sodium 134 on 5/17, labs ordered for tomorrow  Continue to monitor 16.  Transaminitis  ALT elevated on 5/17  Continue to monitor 17.  Acute blood loss anemia  Hemoglobin 11.5 on 5/17  Continue to monitor 18. MEWS 2: elevated pulse and respirations on 5/21: monitor vitals closely.   5/22- much improved today-w ill monitor 19. R frontal HA  5/23- asked pt to get pain meds- has them to ask for prn- if doesn't improve, have nursing call me   LOS: 8 days A FACE TO FACE EVALUATION WAS PERFORMED  Megan Lovorn 02/10/2020, 1:41 PM

## 2020-02-11 ENCOUNTER — Inpatient Hospital Stay (HOSPITAL_COMMUNITY): Payer: BC Managed Care – PPO | Admitting: Speech Pathology

## 2020-02-11 ENCOUNTER — Inpatient Hospital Stay (HOSPITAL_COMMUNITY): Payer: BC Managed Care – PPO | Admitting: Occupational Therapy

## 2020-02-11 ENCOUNTER — Inpatient Hospital Stay (HOSPITAL_COMMUNITY): Payer: BC Managed Care – PPO | Admitting: Physical Therapy

## 2020-02-11 LAB — GLUCOSE, CAPILLARY
Glucose-Capillary: 53 mg/dL — ABNORMAL LOW (ref 70–99)
Glucose-Capillary: 58 mg/dL — ABNORMAL LOW (ref 70–99)
Glucose-Capillary: 72 mg/dL (ref 70–99)
Glucose-Capillary: 231 mg/dL — ABNORMAL HIGH (ref 70–99)
Glucose-Capillary: 220 mg/dL — ABNORMAL HIGH (ref 70–99)
Glucose-Capillary: 287 mg/dL — ABNORMAL HIGH (ref 70–99)

## 2020-02-11 NOTE — Progress Notes (Signed)
Hypoglycemic Event  CBG: 53 mg/dL on right hand and 58 mg/dL on left hand  Treatment: 4 oz juice administered  Symptoms: Patient denied any sx; no s/sx of hypoglycemia observed  Follow-up CBG: Time: 0652 CBG Result: 72 mg/dL  Possible Reasons for Event: Levemir 31 units administered 02/10/20 @2035  as scheduled for CBG 156 mg/dL  Comments/MD notified: Hypoglycemic protocol followed per facility outline   , MSN, RN, CNL IP Rehab

## 2020-02-11 NOTE — Progress Notes (Signed)
Speech Language Pathology Weekly Progress and Session Note  Patient Details  Name: Nicholas [name] MRN: [id number] Date of Birth: 05/27/1951  Beginning of progress report period: Feb 03, 2020 End of progress report period: Feb 11, 2020  Today's Date: 02/11/2020 SLP Individual Time: 0928-1029 SLP Individual Time Calculation (min): 61 min  Short Term Goals: Week 1: SLP Short Term Goal 1 (Week 1): Patient will participate in further assessment of cognitive skills. SLP Short Term Goal 1 - Progress (Week 1): Met SLP Short Term Goal 2 (Week 1): Patient will respond to basic yes/no questions targeting wants/needs in 8 out of 10 opportunities with max A. SLP Short Term Goal 2 - Progress (Week 1): Met SLP Short Term Goal 3 (Week 1): Patient will follow 2 step directions with 80% accuracy and max A. SLP Short Term Goal 3 - Progress (Week 1): Progressing toward goal SLP Short Term Goal 4 (Week 1): Patient will participate in visual scanning tasks scannning left of midline in 5 of 10 opportunities with max A. SLP Short Term Goal 4 - Progress (Week 1): Met SLP Short Term Goal 5 (Week 1): Patient will participate in a sustained attention task for 5 minutes with max A. SLP Short Term Goal 5 - Progress (Week 1): Met    New Short Term Goals: Week 2: SLP Short Term Goal 1 (Week 2): Pt will sustain attention to functional tasks for 7 minute intervals with Max A cues. SLP Short Term Goal 2 (Week 2): Pt will recall new and daily infor mation with Max A for use of aids or strategies. SLP Short Term Goal 3 (Week 2): Pt will scan left visual field in 7/10 opportunities provided Mod A multimodal cues. SLP Short Term Goal 4 (Week 2): Patient will follow 2 step directions with 80% accuracy and max A. SLP Short Term Goal 5 (Week 2): Pt will demonstrate functional problem solving skills in basic familiar situations with Mod A multimodal cues.  Weekly Progress Updates: Pt has made slow but functional gains and met 4  out of 5 short term goals. Pt is currently Max assist for basic tasks due to cognitive impairments impacting his initiation, sustained attention, recall and functional problem solving. He has also been limited at times by fatigue, slowing progress. Pt has demonstrated improved participation in therapies and verbal initiation, as well as visual scanning tasks. Pt and family education is ongoing. Pt would continue to benefit from skilled ST while inpatient in order to maximize functional independence and reduce burden of care prior to discharge. Anticipate that pt will need 24/7 supervision at discharge in addition to [place] follow up at next level of care.      Intensity: Minumum of 1-2 x/day, 30 to 90 minutes Frequency: 3 to 5 out of 7 days Duration/Length of Stay: 03/04/20 Treatment/Interventions: Cognitive remediation/compensation;Speech/Language facilitation;Cueing hierarchy;Functional tasks;Internal/external aids;Patient/family education;Therapeutic Activities;Environmental controls   Daily Session  Skilled Therapeutic Interventions: Pt was seen for skilled ST targeting cognition. Pt's levels of engagement and ability to sustain attention fluctuated drastically throughout session. One instance of language of confusion noted when describing his weekend to SLP. Pt easily redirected. He was oriented to place and situation, but Total A required for orientation to month. Unable to use calendar functionally. Pt participated in further investigation of cognitive abilities. Although MOCA Basic not administered in full, he did participate in subtests that clearly indicated new finding of severe short term memory and problem solving deficits (also likely impacted by attention). Will initiate appropriate  goals for memory and attention. Pt sorted cards by color with overall Max A verbal and visual cues for sustained attention, problem solving and error awareness. Max A cues to locate pile of cards left of midline  could be faded to Mod A ~3/4 way through the task. Pt left in bed with son present and needs met. Continue per current plan of care.        Pain Pain Assessment Pain Scale: 0-10 Pain Score: 0-No pain  Therapy/Group: Individual Therapy  Arbutus Leas 02/11/2020, 7:05 AM

## 2020-02-11 NOTE — Progress Notes (Signed)
Physical Therapy Weekly Progress Note  Patient Details  Name: Nicholas [name] MRN: [id number] Date of Birth: 10-Aug-1951  Beginning of progress report period: Feb 03, 2020 End of progress report period: Feb 11, 2020  Today's Date: 02/11/2020 PT Individual Time: 8101-7510 PT Individual Time Calculation (min): 45 min   Patient has met 3 of 3 short term goals.  He continues to be easily fatigued and with inconsistent level of participation and interaction with therapists, and can also be inconsistent in terms of physical performance with therapy as well.   Patient continues to demonstrate the following deficits muscle weakness, decreased cardiorespiratoy endurance, unbalanced muscle activation, motor apraxia, decreased coordination and decreased motor planning, decreased visual perceptual skills and field cut, decreased midline orientation, decreased attention to left and decreased motor planning, decreased initiation, decreased attention, decreased awareness, decreased problem solving, decreased safety awareness, decreased memory and delayed processing and decreased sitting balance, decreased standing balance, decreased postural control and decreased balance strategies and therefore will continue to benefit from skilled PT intervention to increase functional independence with mobility.  Patient progressing toward long term goals..  Continue plan of care.  PT Short Term Goals Week 1:  PT Short Term Goal 1 (Week 1): Pt will perform bed<>chair transfer max +2 without lift equipment PT Short Term Goal 1 - Progress (Week 1): Met PT Short Term Goal 2 (Week 1): Pt will perform sit<>stand with max assist +2 PT Short Term Goal 2 - Progress (Week 1): Met PT Short Term Goal 3 (Week 1): Pt will maintain sitting balance statically EOB with CGA x 5 minutes PT Short Term Goal 3 - Progress (Week 1): Met Week 2:  PT Short Term Goal 1 (Week 2): Patient to be able to roll both ways with no more than ModAx2 and use of  bed features PT Short Term Goal 2 (Week 2): patient to be able to get to/from EOB with no more than ModAx2 and use of bed features PT Short Term Goal 3 (Week 2): Patient to be able to transfer from bed to chair with no more than ModAx2 and least restrictive technqiue PT Short Term Goal 4 (Week 2): Patient to tolerate standing for at least 2 minutes in standing frame  Skilled Therapeutic Interventions/Progress Updates:    Patient received up in [facility], much more interactive with therapist and following cues well today for first part of session. Continues to require maxAx2 for sliding board transfers over to mat table with max cues for sequencing and safety. Tolerated sitting at edge of mat table for approximately 10 minutes with min guard-MinAx2, attempted to get him to participate in activities at edge of table however he kept perseverating on "there's some programming going on with my low back and I wish you could press a button and just delete it or modify it" and talking about "Timed schedules and programming, etc". Unable to really figure out what he was talking about but he did answer questions therapist asked him. Returned to his room and performed SBT back to bed with maxAx2, required maxAx2 for return to supine and positioning in bed. Left in bed with all needs met, bed alarm active and family present.   Therapy Documentation Precautions:  Precautions Precautions: None Precaution Comments: Lt hemiparesis with Lt neglect ; watch BP Restrictions Weight Bearing Restrictions: No Other Position/Activity Restrictions: keep BP >160 Pain: Pain Assessment Pain Scale: Faces Pain Score: 0-No pain Faces Pain Scale: Hurts little more Pain Type: Acute pain Pain Location: Generalized Pain  Orientation: Right;Left Pain Descriptors / Indicators: Discomfort Pain Onset: On-going Patients Stated Pain Goal: 0 Pain Intervention(s): Repositioned;Emotional support Multiple Pain Sites: No   Therapy/Group:  Individual Therapy  Windell Norfolk, DPT, PN1   Supplemental Physical Therapist [place]    Pager [phone] Acute Rehab Office [phone]

## 2020-02-11 NOTE — Plan of Care (Signed)
  Problem: Consults Goal: RH STROKE PATIENT EDUCATION Description: See Patient Education module for education specifics  Outcome: Progressing   Problem: RH BOWEL ELIMINATION Goal: RH STG MANAGE BOWEL W/MEDICATION W/ASSISTANCE Description: STG Manage Bowel with Medication with  Mod Assistance. Outcome: Progressing   Problem: RH BLADDER ELIMINATION Goal: RH STG MANAGE BLADDER WITH ASSISTANCE Description: STG Manage Bladder With Mod Assistance Outcome: Progressing Goal: RH STG MANAGE BLADDER WITH MEDICATION WITH ASSISTANCE Description: STG Manage Bladder With Medication With Mod I Assistance. Outcome: Progressing   Problem: RH SKIN INTEGRITY Goal: RH STG SKIN FREE OF INFECTION/BREAKDOWN Description: Mod I Outcome: Progressing   Problem: RH SAFETY Goal: RH STG ADHERE TO SAFETY PRECAUTIONS W/ASSISTANCE/DEVICE Description: STG Adhere to Safety Precautions With Min Assistance/Device. Outcome: Progressing Goal: RH STG DECREASED RISK OF FALL WITH ASSISTANCE Description: STG Decreased Risk of Fall With Min Assistance. Outcome: Progressing   Problem: RH COGNITION-NURSING Goal: RH STG ANTICIPATES NEEDS/CALLS FOR ASSIST W/ASSIST/CUES Description: STG Anticipates Needs/Calls for Assist With Min  Assistance/Cues. Outcome: Progressing   Problem: RH Vision Goal: RH LTG Vision (Specify) Outcome: Progressing   

## 2020-02-11 NOTE — Progress Notes (Signed)
Occupational Therapy Weekly Progress Note  Patient Details  Name: Nicholas [name] MRN: [id number] Date of Birth: 09/07/1951  Beginning of progress report period: Feb 03, 2020 End of progress report period: Feb 11, 2020  Today's Date: 02/11/2020 OT Individual Time: 1105-1208 OT Individual Time Calculation (min): 63 min    Patient has met 1 of 4 short term goals.  Pt requiring significant level of assist with +2 for functional transfers secondary to multiple impairments listed below.  Pt appears quite apathetic with very slow processing and requires max multimodal cueing through sessions to facilitate participation and still with poor initiation/follow during all tasks.  Pt does have a good amount of active movement in LUE however poor attention and recruitment during functional tasks.  Pt continues to close right eye consistently despite cues to open and exhibits poor spatial awareness and a left visual field cut when asked to draw a clock.  Overall slow progress towards goals this week.    Patient continues to demonstrate the following deficits: muscle weakness, decreased cardiorespiratoy endurance, impaired timing and sequencing, unbalanced muscle activation, motor apraxia, ataxia, decreased coordination and decreased motor planning, decreased visual perceptual skills, decreased visual motor skills and field cut, decreased midline orientation, decreased attention to left, left side neglect, decreased motor planning and ideational apraxia and decreased initiation, decreased attention, decreased awareness, decreased problem solving, decreased safety awareness, decreased memory and delayed processing and therefore will continue to benefit from skilled OT intervention to enhance overall performance with BADL.  Patient progressing toward long term goals..  Continue plan of care.  OT Short Term Goals Week 1:  OT Short Term Goal 1 (Week 1): Pt will maintain static sitting balance (unsupported) with min A in  prep for dyanamic sititng balance in ADL with OT Short Term Goal 2 (Week 1): Pt will don shirt with mod A OT Short Term Goal 3 (Week 1): Pt will perform transfer of max A +1 with LRAD method OT Short Term Goal 4 (Week 1): PT will perform bed mobility in prep for ADL tasks with max A+1 (rolling/ supine to sit)   Week 2:  OT Short Term Goal 1 (Week 2): Pt will maintain static sitting balance (unsupported) with min A in prep for dyanamic sititng balance in ADL OT Short Term Goal 2 (Week 2): Pt will don shirt with mod assist. OT Short Term Goal 3 (Week 2): Pt will perform sliding board transfers with max assist +1. OT Short Term Goal 4 (Week 2): Pt will wash UB with mod assist using BUE.   Skilled Therapeutic Interventions/Progress Updates:  Pt appearing flat and mostly nonverbal throughout session this day. Pt received supine in bed, son at side, requesting OT to get pt up for therapy.  Educated pts son on current pt activity tolerance requiring some bed level intervention so as to not over fatigue pt.  Bed level dressing and bathing completed with total assist due to low activity tolerance and poor follow through of one step directions with max multimodal cueing.  Pt instructed in body mechanics during rolling with max assist and max VCs to grasp/release bed rails appropriately (pt grasping bed rail then unable to initiate release on own).  Max assist to complete supine to sit EOB with mattress deflated.  Upong sitting, pt initially falling posteriorly without self righting, however with VCs and external focused directions pt able to sit upright with CGA for approx 15-20 seconds.  Max assist + 2 to complete sliding board transfer EOB to  w/c.  Pt agreeable to brush teeth sinkside.  When asked pt to scan and identify toothbrush on sink, pt reports he cant find it.  Pt brushed teeth with setup and step by step VCs for sequencing due to pt perseverating on each part of task.  Pt brushed hair with setup.   Frequent cues and max assist needed to use mirror for visual feedback and to self right to center due to significant right sided leaning. Trialed left eye occlusion using tape over eyeglass lense.  Instructed pts son to take tape off in about 4 hours.  Noted right eye opening consistently after tape applied to left lense.  Pt up in w/c, safety belt donned, needs in reach, and son at side.     Therapy Documentation Precautions:  Precautions Precautions: None Precaution Comments: Lt hemiparesis with Lt neglect ; watch BP Restrictions Weight Bearing Restrictions: No Other Position/Activity Restrictions: keep BP >160  Therapy Vitals Temp: 98.4 F (36.9 C) Pulse Rate: 97 Resp: 18 BP: 126/71 Patient Position (if appropriate): Sitting Oxygen Therapy SpO2: 98 % O2 Device: Room Air Pain: Pain Assessment Pain Scale: 0-10 Pain Score: 0-No pain ADL: ADL Eating: Maximal assistance Grooming: Maximal assistance Upper Body Bathing: Maximal cueing, Moderate assistance Where Assessed-Upper Body Bathing: Shower Lower Body Bathing: Maximal assistance Where Assessed-Lower Body Bathing: Shower Upper Body Dressing: Moderate assistance Where Assessed-Upper Body Dressing: Wheelchair Lower Body Dressing: Dependent Where Assessed-Lower Body Dressing: Bed level Toileting: Not assessed Toilet Transfer: Not assessed Walk-In Shower Transfer: Not assessed Social research officer, government Method: Other (comment)(roll in shower chair) Youth worker: Other (comment)   Therapy/Group: Individual Therapy  Ezekiel Slocumb 02/11/2020, 2:59 PM

## 2020-02-11 NOTE — Progress Notes (Signed)
[place] PHYSICAL MEDICINE & REHABILITATION PROGRESS NOTE   Subjective/Complaints: Patient seen laying in bed this morning.  He states he slept well overnight.  He states he had a good weekend.  Overnight, noted to have hypoglycemia.   ROS: Denies CP, SOB, N/V/D  Objective:   No results found. No results for input(s): WBC, HGB, HCT, PLT in the last 72 hours. Recent Labs    02/09/20 0540  CREATININE 0.66    Intake/Output Summary (Last 24 hours) at 02/11/2020 1253 Last data filed at 02/11/2020 0911 Gross per 24 hour  Intake 940 ml  Output 651 ml  Net 289 ml     Physical Exam: Vital Signs Blood pressure (!) 110/54, pulse 79, temperature 97.8 F (36.6 C), temperature source Oral, resp. rate 19, height 6' (1.829 m), weight 89.8 kg, SpO2 98 %. Constitutional: No distress . Vital signs reviewed. HENT: Normocephalic.  Atraumatic. Eyes: Keeps right eye closed.  No discharge. Cardiovascular: No JVD. Respiratory: Normal effort.  No stridor. GI: Non-distended. Skin: Warm and dry.  Intact. Psych: Flat.  Delayed.  Slowed. Musc: No edema in extremities.  No tenderness in extremities. Neurological: Alert L facial droop Right lean, unchanged Motor: LUE: 4 -/5 proximal distal with apraxia LLE: 2+/5 proximal distal (?limited due to participation) Difficult to assess tone due to patient resistance  Assessment/Plan: 1. Functional deficits secondary to [facility] which require 3+ hours per day of interdisciplinary therapy in a comprehensive inpatient rehab setting.  Physiatrist is providing close team supervision and 24 hour management of active medical problems listed below.  Physiatrist and rehab team continue to assess barriers to discharge/monitor patient progress toward functional and medical goals  Care Tool:  Bathing    Body parts bathed by patient: Chest, Abdomen, Front perineal area, Right upper leg, Left upper leg   Body parts bathed by helper: Right arm, Left arm, Chest,  Abdomen(UB only)     Bathing assist Assist Level: Maximal Assistance - Patient 24 - 49%     Upper Body Dressing/Undressing Upper body dressing   What is the patient wearing?: Pull over shirt    Upper body assist Assist Level: Moderate Assistance - Patient 50 - 74%    Lower Body Dressing/Undressing Lower body dressing      What is the patient wearing?: Incontinence brief     Lower body assist Assist for lower body dressing: Total Assistance - Patient < 25%     Toileting Toileting    Toileting assist Assist for toileting: Dependent - Patient 0%(Incontinent)     Transfers Chair/bed transfer  Transfers assist     Chair/bed transfer assist level: Total Assistance - Patient < 25%     Locomotion Ambulation   Ambulation assist   Ambulation activity did not occur: Safety/medical concerns          Walk 10 feet activity   Assist  Walk 10 feet activity did not occur: Safety/medical concerns        Walk 50 feet activity   Assist Walk 50 feet with 2 turns activity did not occur: Safety/medical concerns         Walk 150 feet activity   Assist Walk 150 feet activity did not occur: Safety/medical concerns         Walk 10 feet on uneven surface  activity   Assist Walk 10 feet on uneven surfaces activity did not occur: Safety/medical concerns         Wheelchair     Assist Will patient use wheelchair  at discharge?: Yes Type of Wheelchair: Manual    Wheelchair assist level: Dependent - Patient 0% Max wheelchair distance: 150 ft    Wheelchair 50 feet with 2 turns activity    Assist        Assist Level: Dependent - Patient 0%   Wheelchair 150 feet activity     Assist      Assist Level: Dependent - Patient 0%    Medical Problem List and Plan: 1.  Left-sided weakness secondary to left greater than right subdural hematoma.  Status post cerebral angiogram balloon angioplasty  Continue CIR 15/17  Bilateral PRAFO  ordered 2.  Antithrombotics: -DVT/anticoagulation: Lovenox initiated 01/29/2020             -antiplatelet therapy: Aspirin 325 mg daily 3. Pain Management: Oxycodone as needed, Robaxin as needed. Heating pad to relax tight paraspinals of cervical and lumbar spine  Changed Oxycodone to Norco 7.5 mg/325 mg q4 hours per son request since been so sedated.  Controlled on 5/24 4. Mood: Provide emotional support             -antipsychotic agents: N/A  Zoloft daily.  5. Neuropsych: This patient is not capable of making decisions on his own behalf. 6. Skin/Wound Care: Routine skin checks 7. Fluids/Electrolytes/Nutrition: Routine in and outs   Carb modified diet- p.o. intake improving. 8.  Diabetes mellitus with hyperglycemia.  Hemoglobin A1c 7.7.  Check blood sugars before meals and at bedtime.  Patient on Tresiba 35 units daily, Glucotrol 10 mg daily, Actos 45 mg daily, Janumet 50-1000 mg twice daily, NovoLog 75 units daily prior to admission.               Levemir 31U BID  Labile on 5/24   CBG (last 3)  Recent Labs    02/11/20 0633 02/11/20 0653 02/11/20 1131  GLUCAP 58* 72 231*   9.  Seizure prophylaxis. Keppra 500 mg twice daily 10.  Arrhythmia/wide-complex tachycardia.    Amiodarone 200 mg daily x4 weeks (~6/12).  Follow-up cardiology services 11.  Hypertension with orthostasis.  Autoregulate on Nimotop.  Elevated at rest, however orthostasis noted by therapies, will avoid overcorrection   TEDS and abdominal binder as needed   Relatively controlled on 5/24 12.  Hyperlipidemia.  Crestor/fenofibrate 13. Mild vasospasm evident on 5/14 vascular transcranial doppler. 14. Spasticity:  Started baclofen 5mg  TID for pain relief and spasticity, changed to nightly due to ?lethargy.  15.  Hyponatremia  Sodium 135 on 5/21  Continue to monitor 16.  Transaminitis  ALT elevated on 5/17  Continue to monitor 17.  Acute blood loss anemia  Hemoglobin 11.5 on 5/17, labs ordered for  tomorrow  Continue to monitor  LOS: 9 days A FACE TO FACE EVALUATION WAS PERFORMED  Ankit Lorie Phenix 02/11/2020, 12:53 PM

## 2020-02-12 ENCOUNTER — Inpatient Hospital Stay (HOSPITAL_COMMUNITY): Payer: BC Managed Care – PPO | Admitting: Occupational Therapy

## 2020-02-12 ENCOUNTER — Inpatient Hospital Stay (HOSPITAL_COMMUNITY): Payer: BC Managed Care – PPO | Admitting: Speech Pathology

## 2020-02-12 ENCOUNTER — Inpatient Hospital Stay (HOSPITAL_COMMUNITY): Payer: BC Managed Care – PPO | Admitting: Physical Therapy

## 2020-02-12 DIAGNOSIS — Z2989 Encounter for other specified prophylactic measures: Secondary | ICD-10-CM

## 2020-02-12 DIAGNOSIS — Z298 Encounter for other specified prophylactic measures: Secondary | ICD-10-CM

## 2020-02-12 LAB — CBC WITH DIFFERENTIAL/PLATELET
Abs Immature Granulocytes: 0.02 10*3/uL (ref 0.00–0.07)
Basophils Absolute: 0 10*3/uL (ref 0.0–0.1)
Basophils Relative: 1 %
Eosinophils Absolute: 0.1 10*3/uL (ref 0.0–0.5)
Eosinophils Relative: 1 %
HCT: 35 % — ABNORMAL LOW (ref 39.0–52.0)
Hemoglobin: 11.6 g/dL — ABNORMAL LOW (ref 13.0–17.0)
Immature Granulocytes: 0 %
Lymphocytes Relative: 20 %
Lymphs Abs: 1.2 10*3/uL (ref 0.7–4.0)
MCH: 29.4 pg (ref 26.0–34.0)
MCHC: 33.1 g/dL (ref 30.0–36.0)
MCV: 88.8 fL (ref 80.0–100.0)
Monocytes Absolute: 0.6 10*3/uL (ref 0.1–1.0)
Monocytes Relative: 10 %
Neutro Abs: 4.1 10*3/uL (ref 1.7–7.7)
Neutrophils Relative %: 68 %
Platelets: 294 10*3/uL (ref 150–400)
RBC: 3.94 MIL/uL — ABNORMAL LOW (ref 4.22–5.81)
RDW: 14.6 % (ref 11.5–15.5)
WBC: 6 10*3/uL (ref 4.0–10.5)
nRBC: 0 % (ref 0.0–0.2)

## 2020-02-12 LAB — GLUCOSE, CAPILLARY
Glucose-Capillary: 134 mg/dL — ABNORMAL HIGH (ref 70–99)
Glucose-Capillary: 228 mg/dL — ABNORMAL HIGH (ref 70–99)
Glucose-Capillary: 221 mg/dL — ABNORMAL HIGH (ref 70–99)
Glucose-Capillary: 332 mg/dL — ABNORMAL HIGH (ref 70–99)

## 2020-02-12 MED ORDER — LEVETIRACETAM 100 MG/ML PO SOLN
250.0000 mg | Freq: Two times a day (BID) | ORAL | Status: DC
  Administered 2020-02-12 – 2020-02-15 (×6): 250 mg via ORAL
  Filled 2020-02-12 (×6): qty 5

## 2020-02-12 NOTE — Progress Notes (Addendum)
Occupational Therapy Session Note  Patient Details  Name: Nicholas [name] MRN: [id number] Date of Birth: September 06, 1951  Today's Date: 02/13/2020 OT Individual Time:  11:15- 12:00pm (45 minutes)      Short Term Goals: Week 2:  OT Short Term Goal 1 (Week 2): Pt will maintain static sitting balance (unsupported) with min A in prep for dyanamic sititng balance in ADL OT Short Term Goal 2 (Week 2): Pt will don shirt with mod assist. OT Short Term Goal 3 (Week 2): Pt will perform sliding board transfers with max assist +1. OT Short Term Goal 4 (Week 2): Pt will wash UB with mod assist using BUE.  Skilled Therapeutic Interventions/Progress Updates:    Pt supine in bed, flat affect and nonverbal at OT arrival.  Pt noted with both eyes open consistently during session with tape occluding left lens.  Pt's son reports to therapist that pt was very talkative yesterday late afternoon.  Provided cues to facilitate pt to choose desired clothing to wear this day.  Pt requiring significantly lengthened time to process cues and finally pointing to desired pair of pants.  Max multimodal cues provided to attempt to increase pt participation during bed level dressing, however pt very passive throughout dressing tasks.  Donned pants, TED hose, and shirt with total assist including rolling left and right. Pt did grasp bed rails when hand placed however needing max assist to facilitate release.  Max assist to ensure good body mechanics and TCs to bring left shoulder forward supine to sit.  Total assist + 2 sliding board transfer EOB to w/c.  When pt asked how he felt transfer went, pt reported "it hurt like hell", however no number or location specified by pt when asked.  Pt showed no other signs of pain throughout session.  Pt completed oral hygiene sitting sinkside requiring VCs to facilitate initation and sequencing of steps and increased time to process one step directions.  Frequent intermittent multimodal cues including  instruction to use mirror for visual feedback and mod assist to right to midline due to pt significantly leaning right and anteriorly while brushing teeth.  Kyphotic neck posture at rest therefore tilted w/c back for gravity assisted stretch.  Removed tape over left eyeglass lens to rest pts eye.  Seat belt alarm donned, call bell in reach.  Therapy Documentation Precautions:  Precautions Precautions: None Precaution Comments: Lt hemiparesis with Lt neglect ; watch BP Restrictions Weight Bearing Restrictions: No Other Position/Activity Restrictions: keep BP >160  ADL: ADL Eating: Maximal assistance Grooming: Maximal assistance Upper Body Bathing: Maximal cueing, Moderate assistance Where Assessed-Upper Body Bathing: Shower Lower Body Bathing: Maximal assistance Where Assessed-Lower Body Bathing: Shower Upper Body Dressing: Moderate assistance Where Assessed-Upper Body Dressing: Wheelchair Lower Body Dressing: Dependent Where Assessed-Lower Body Dressing: Bed level Toileting: Not assessed Toilet Transfer: Not assessed Walk-In Shower Transfer: Not assessed Film/video editor Method: Other (comment)(roll in shower chair) Astronomer: Other (comment)      Therapy/Group: Individual Therapy  Amie Critchley 02/13/2020, 10:08 AM

## 2020-02-12 NOTE — Progress Notes (Signed)
Speech Language Pathology Daily Session Note  Patient Details  Name: Nicholas [name] MRN: [id number] Date of Birth: April 01, 1951  Today's Date: 02/12/2020 SLP Individual Time: 0930-1014 SLP Individual Time Calculation (min): 44 min  Short Term Goals: Week 2: SLP Short Term Goal 1 (Week 2): Pt will sustain attention to functional tasks for 7 minute intervals with Max A cues. SLP Short Term Goal 2 (Week 2): Pt will recall new and daily infor mation with Max A for use of aids or strategies. SLP Short Term Goal 3 (Week 2): Pt will scan left visual field in 7/10 opportunities provided Mod A multimodal cues. SLP Short Term Goal 4 (Week 2): Patient will follow 2 step directions with 80% accuracy and max A. SLP Short Term Goal 5 (Week 2): Pt will demonstrate functional problem solving skills in basic familiar situations with Mod A multimodal cues.  Skilled Therapeutic Interventions: Pt was seen for skilled ST targeting cognition. Upon arrival, pt found to be incontinent of bowel. NT provided assist with hygiene while SLP provided Mod A multimodal cues for comprehension and execution of basic 1-step directions during bed mobility. Min A verbal cues also provided to assist with pt expressing pain/specifying area. Once clean and dry, pt was very fatigued, requiring constant multimodal stimulation to maintain arousal. He was minimally verbal but followed directions to recreate very basic LEGO structures with Max A multimodal cueing for sustained attention and problem solving. Answered questions from pt's son regarding fluctuations in lethargy and endurance, as well as activities to engage in outside of therapy for cognitive stimulation/retraining when pt is fully alert and restless (as son reports he occasionally is). Pt left laying in bed with alarm set and needs within reach, son present.       Pain Pain Assessment Pain Scale: Faces Pain Score: 0-No pain Faces Pain Scale: Hurts little more Pain Type: Acute  pain Pain Location: Sacrum Pain Descriptors / Indicators: Grimacing;Aching Pain Onset: With Activity Pain Intervention(s): Repositioned Multiple Pain Sites: No  Therapy/Group: Individual Therapy  [name] 02/12/2020, 7:14 AM

## 2020-02-12 NOTE — Plan of Care (Signed)
  Problem: RH SKIN INTEGRITY Goal: RH STG SKIN FREE OF INFECTION/BREAKDOWN Description: Mod I Outcome: Progressing   Problem: RH COGNITION-NURSING Goal: RH STG ANTICIPATES NEEDS/CALLS FOR ASSIST W/ASSIST/CUES Description: STG Anticipates Needs/Calls for Assist With Min  Assistance/Cues. Outcome: Progressing

## 2020-02-12 NOTE — Progress Notes (Signed)
[place] PHYSICAL MEDICINE & REHABILITATION PROGRESS NOTE   Subjective/Complaints: Patient seen laying in bed this morning.  He states he slept well overnight.  He is slow to engage.  No reported issues overnight.  ROS: Denies CP, SOB, N/V/D  Objective:   No results found. Recent Labs    02/12/20 0518  WBC 6.0  HGB 11.6*  HCT 35.0*  PLT 294   No results for input(s): NA, K, CL, CO2, GLUCOSE, BUN, CREATININE, CALCIUM in the last 72 hours.  Intake/Output Summary (Last 24 hours) at 02/12/2020 0820 Last data filed at 02/12/2020 0030 Gross per 24 hour  Intake 720 ml  Output 600 ml  Net 120 ml     Physical Exam: Vital Signs Blood pressure (!) 144/85, pulse 87, temperature 98.2 F (36.8 C), resp. rate 18, height 6' (1.829 m), weight 89.8 kg, SpO2 99 %. Constitutional: No distress . Vital signs reviewed. HENT: Normocephalic.  Atraumatic. Eyes: EOMI, able to open right eye when asked. No discharge. Cardiovascular: No JVD. Respiratory: Normal effort.  No stridor. GI: Non-distended. Skin: Warm and dry.  Intact. Psych: Flat.  Delayed.  Slowed. Musc: No edema in extremities.  No tenderness in extremities. Neurological: Alert L facial droop Right lean, persistent Motor: LUE: 4 -/5 proximal distal with apraxia LLE: 2-/5 proximal distal (?limited due to participation) Difficult to assess tone due to patient resistance  Assessment/Plan: 1. Functional deficits secondary to [facility] which require 3+ hours per day of interdisciplinary therapy in a comprehensive inpatient rehab setting.  Physiatrist is providing close team supervision and 24 hour management of active medical problems listed below.  Physiatrist and rehab team continue to assess barriers to discharge/monitor patient progress toward functional and medical goals  Care Tool:  Bathing    Body parts bathed by patient: Abdomen, Front perineal area, Left upper leg, Face   Body parts bathed by helper: Right arm, Left arm,  Chest, Buttocks, Right lower leg, Left lower leg     Bathing assist Assist Level: Total Assistance - Patient < 25%     Upper Body Dressing/Undressing Upper body dressing   What is the patient wearing?: Pull over shirt    Upper body assist Assist Level: Total Assistance - Patient < 25%    Lower Body Dressing/Undressing Lower body dressing      What is the patient wearing?: Pants, Incontinence brief     Lower body assist Assist for lower body dressing: Dependent - Patient 0%     Toileting Toileting    Toileting assist Assist for toileting: Dependent - Patient 0%     Transfers Chair/bed transfer  Transfers assist     Chair/bed transfer assist level: 2 Helpers(sliding board)     Locomotion Ambulation   Ambulation assist   Ambulation activity did not occur: Safety/medical concerns          Walk 10 feet activity   Assist  Walk 10 feet activity did not occur: Safety/medical concerns        Walk 50 feet activity   Assist Walk 50 feet with 2 turns activity did not occur: Safety/medical concerns         Walk 150 feet activity   Assist Walk 150 feet activity did not occur: Safety/medical concerns         Walk 10 feet on uneven surface  activity   Assist Walk 10 feet on uneven surfaces activity did not occur: Safety/medical concerns         Wheelchair  Assist Will patient use wheelchair at discharge?: Yes Type of Wheelchair: Manual    Wheelchair assist level: Dependent - Patient 0% Max wheelchair distance: 150 ft    Wheelchair 50 feet with 2 turns activity    Assist        Assist Level: Dependent - Patient 0%   Wheelchair 150 feet activity     Assist      Assist Level: Dependent - Patient 0%    Medical Problem List and Plan: 1.  Left-sided weakness secondary to left greater than right subdural hematoma.  Status post cerebral angiogram balloon angioplasty  Continue CIR 15/17  Bilateral PRAFO 2.   Antithrombotics: -DVT/anticoagulation: Lovenox initiated 01/29/2020             -antiplatelet therapy: Aspirin 325 mg daily 3. Pain Management: Oxycodone as needed, Robaxin as needed. Heating pad to relax tight paraspinals of cervical and lumbar spine  Changed Oxycodone to Norco 7.5 mg/325 mg q4 hours per son request since been so sedated.  Controlled on 5/25 4. Mood: Provide emotional support             -antipsychotic agents: N/A  Zoloft daily.  5. Neuropsych: This patient is not capable of making decisions on his own behalf. 6. Skin/Wound Care: Routine skin checks 7. Fluids/Electrolytes/Nutrition: Routine in and outs   Carb modified diet- p.o. intake improved. 8.  Diabetes mellitus with hyperglycemia.  Hemoglobin A1c 7.7.  Check blood sugars before meals and at bedtime.  Patient on Tresiba 35 units daily, Glucotrol 10 mg daily, Actos 45 mg daily, Janumet 50-1000 mg twice daily, NovoLog 75 units daily prior to admission.               Levemir 31U BID  Very labile on 5/25   CBG (last 3)  Recent Labs    02/11/20 1703 02/11/20 2057 02/12/20 0601  GLUCAP 220* 287* 134*   9.  Seizure prophylaxis. Keppra 500 mg twice daily, decreased to 250 twice daily on 5/25 10.  Arrhythmia/wide-complex tachycardia.    Amiodarone 200 mg daily x4 weeks (~6/12).  Follow-up cardiology services 11.  Hypertension with orthostasis.  Autoregulate on Nimotop.  Elevated at rest, however orthostasis noted by therapies, will avoid overcorrection   TEDS and abdominal binder as needed   Labile on 5/25 12.  Hyperlipidemia.  Crestor/fenofibrate 13. Mild vasospasm evident on 5/14 vascular transcranial doppler. 14. Spasticity:  Started baclofen 5mg  TID for pain relief and spasticity, changed to nightly due to ?lethargy.  Will consider deseeding in future if appropriate 15.  Hyponatremia  Sodium 135 on 5/21  Continue to monitor 16.  Transaminitis  ALT elevated on 5/17  Continue to monitor 17.  Acute blood loss  anemia  Hemoglobin 11.6 on 5/25  Continue to monitor  LOS: 10 days A FACE TO FACE EVALUATION WAS PERFORMED  Nicholas [name] 6/25 02/12/2020, 8:20 AM

## 2020-02-12 NOTE — Progress Notes (Signed)
Physical Therapy Session Note  Patient Details  Name: Nicholas [name] MRN: [id number] Date of Birth: Jan 26, 1951  Today's Date: 02/12/2020 PT Individual Time: 1417-1501 PT Individual Time Calculation (min): 44 min   Short Term Goals: Week 2:  PT Short Term Goal 1 (Week 2): Patient to be able to roll both ways with no more than ModAx2 and use of bed features PT Short Term Goal 2 (Week 2): patient to be able to get to/from EOB with no more than ModAx2 and use of bed features PT Short Term Goal 3 (Week 2): Patient to be able to transfer from bed to chair with no more than ModAx2 and least restrictive technqiue PT Short Term Goal 4 (Week 2): Patient to tolerate standing for at least 2 minutes in standing frame  Skilled Therapeutic Interventions/Progress Updates:    Patient received up in [facility] reporting significant back pain; son [name] present and assisted in session. Able to complete 5-6 sit to stands in stedy with MaxAx2 and Max cues/Max tactile facilitation to promote upright midline posture and enough hip extension- able to maintain standing for 10-15 seconds before fatiguing but actually followed cues well today! Seems to respond really well to [name] in/helping with session. Continues to require MaxAx2 for sit to supine and repositioning in bed, otherwise continued working on stretches for low back to reduce tone and attempt to improve comfort while up in TIS WC. Left in bed with all needs met, bed alarm active and family present this afternoon.   Therapy Documentation Precautions:  Precautions Precautions: None Precaution Comments: Lt hemiparesis with Lt neglect ; watch BP Restrictions Weight Bearing Restrictions: No Other Position/Activity Restrictions: keep BP >160 Pain: Pain Assessment Pain Scale: Faces Faces Pain Scale: Hurts little more Pain Type: Acute pain Pain Location: Back Pain Orientation: Right;Left Pain Descriptors / Indicators: Discomfort Pain Onset:  On-going Patients Stated Pain Goal: 0 Pain Intervention(s): Repositioned Multiple Pain Sites: No    Therapy/Group: Individual Therapy   Windell Norfolk, DPT, PN1   Supplemental Physical Therapist [place]    Pager [phone] Acute Rehab Office [phone]   02/12/2020, 3:47 PM

## 2020-02-12 NOTE — Plan of Care (Signed)
  Problem: RH Problem Solving Goal: LTG Patient will demonstrate problem solving for (SLP) Description: LTG:  Patient will demonstrate problem solving for basic/complex daily situations with cues  (SLP) Flowsheets (Taken 02/12/2020 1228) LTG: Patient will demonstrate problem solving for (SLP): Basic daily situations LTG Patient will demonstrate problem solving for: Minimal Assistance - Patient > 75%   Problem: RH Comprehension Communication Goal: LTG Patient will comprehend basic/complex auditory (SLP) Description: LTG: Patient will comprehend basic/complex auditory information with cues (SLP). Flowsheets Taken 02/12/2020 1228 by Suzzette Righter E, CF-SLP LTG: Patient will comprehend: Basic auditory information Taken 02/03/2020 0834 by Olevia Bowens L, CCC-SLP LTG: Patient will comprehend auditory information with cueing (SLP): Minimal Assistance - Patient > 75%   Problem: RH Expression Communication Goal: LTG Patient will verbally express basic/complex needs(SLP) Description: LTG:  Patient will verbally express basic/complex needs, wants or ideas with cues  (SLP) Flowsheets (Taken 02/03/2020 0834 by Arnette Schaumann, CCC-SLP) LTG: Patient will verbally express basic/complex needs, wants or ideas (SLP): Minimal Assistance - Patient > 75%   Problem: RH Attention Goal: LTG Patient will demonstrate this level of attention during functional activites (SLP) Description: LTG:  Patient will will demonstrate this level of attention during functional activites (SLP) Flowsheets Taken 02/12/2020 1228 by [name], CF-SLP Patient will demonstrate during cognitive/linguistic activities the attention type of: Sustained Number of minutes patient will demonstrate attention during cognitive/linguistic activities: 20 Taken 02/03/2020 0834 by Arnette Schaumann, CCC-SLP Patient will demonstrate this level of attention during cognitive/linguistic activities in: Controlled LTG: Patient will demonstrate this level of  attention during cognitive/linguistic activities with assistance of (SLP): Minimal Assistance - Patient > 75%   Problem: RH Awareness Goal: LTG: Patient will demonstrate awareness during functional activites type of (SLP) Description: LTG: Patient will demonstrate awareness during functional activites type of (SLP) Flowsheets (Taken 02/12/2020 1228) Patient will demonstrate during cognitive/linguistic activities awareness type of: Emergent LTG: Patient will demonstrate awareness during cognitive/linguistic activities with assistance of (SLP): Minimal Assistance - Patient > 75%   Problem: RH Memory Goal: LTG Patient will follow step by step directions w/cues (SLP) Description: LTG: Patient will follow step by step directions with cues (SLP). Flowsheets (Taken 02/12/2020 1228) LTG: Patient will follow step by step directions: 2 steps LTG: Patient will follow step by step directions w/cues: Minimal Assistance - Patient > 75% Goal: LTG Patient will demonstrate ability for day to day (SLP) Description: LTG:   Patient will demonstrate ability for day to day recall/carryover during cognitive/linguistic activities with assist  (SLP) Flowsheets (Taken 02/12/2020 1228) LTG: Patient will demonstrate ability for day to day recall: New information LTG: Patient will demonstrate ability for day to day recall/carryover during cognitive/linguistic activities with assist (SLP): Minimal Assistance - Patient > 75%  Original goals were filed under incorrect care plan by error - therefore, goals moved to stroke care plan

## 2020-02-13 ENCOUNTER — Inpatient Hospital Stay (HOSPITAL_COMMUNITY): Payer: BC Managed Care – PPO | Admitting: Speech Pathology

## 2020-02-13 ENCOUNTER — Inpatient Hospital Stay (HOSPITAL_COMMUNITY): Payer: BC Managed Care – PPO | Admitting: Physical Therapy

## 2020-02-13 ENCOUNTER — Inpatient Hospital Stay (HOSPITAL_COMMUNITY): Payer: BC Managed Care – PPO | Admitting: Occupational Therapy

## 2020-02-13 LAB — GLUCOSE, CAPILLARY
Glucose-Capillary: 109 mg/dL — ABNORMAL HIGH (ref 70–99)
Glucose-Capillary: 103 mg/dL — ABNORMAL HIGH (ref 70–99)
Glucose-Capillary: 215 mg/dL — ABNORMAL HIGH (ref 70–99)
Glucose-Capillary: 176 mg/dL — ABNORMAL HIGH (ref 70–99)
Glucose-Capillary: 90 mg/dL (ref 70–99)

## 2020-02-13 MED ORDER — AMANTADINE HCL 100 MG PO CAPS
100.0000 mg | ORAL_CAPSULE | Freq: Two times a day (BID) | ORAL | Status: DC
  Administered 2020-02-13 – 2020-02-14 (×3): 100 mg via ORAL
  Filled 2020-02-13 (×3): qty 1

## 2020-02-13 MED ORDER — INSULIN ASPART 100 UNIT/ML ~~LOC~~ SOLN
3.0000 [IU] | Freq: Three times a day (TID) | SUBCUTANEOUS | Status: DC
  Administered 2020-02-13 – 2020-02-15 (×6): 3 [IU] via SUBCUTANEOUS

## 2020-02-13 MED ORDER — INSULIN DETEMIR 100 UNIT/ML ~~LOC~~ SOLN
28.0000 [IU] | Freq: Two times a day (BID) | SUBCUTANEOUS | Status: DC
  Administered 2020-02-13 – 2020-02-15 (×4): 28 [IU] via SUBCUTANEOUS
  Filled 2020-02-13 (×5): qty 0.28

## 2020-02-13 NOTE — Progress Notes (Signed)
[facility] PHYSICAL MEDICINE & REHABILITATION PROGRESS NOTE   Subjective/Complaints: Patient seen laying in bed this morning.  He states he slept well overnight.  No reported issues overnight.  Limited engagement again.  ROS: Denies CP, SOB, N/V/D  Objective:   No results found. Recent Labs    02/12/20 0518  WBC 6.0  HGB 11.6*  HCT 35.0*  PLT 294   No results for input(s): NA, K, CL, CO2, GLUCOSE, BUN, CREATININE, CALCIUM in the last 72 hours.  Intake/Output Summary (Last 24 hours) at 02/13/2020 0848 Last data filed at 02/13/2020 0834 Gross per 24 hour  Intake 476 ml  Output --  Net 476 ml     Physical Exam: Vital Signs Blood pressure (!) 151/93, pulse 91, temperature 97.6 F (36.4 C), temperature source Oral, resp. rate 18, height 6' (1.829 m), weight 89.8 kg, SpO2 96 %. Constitutional: No distress . Vital signs reviewed. HENT: Normocephalic.  Atraumatic. Eyes: Keeps right eye closed.  No discharge. Cardiovascular: No JVD. Respiratory: Normal effort.  No stridor. GI: Non-distended. Skin: Warm and dry.  Intact. Psych: Flat.  Slowed.  Delayed. Musc: No edema in extremities.  No tenderness in extremities. Neurological: Alert L facial droop Right lean, unchanged Motor: LUE: 4 -/5 proximal distal with apraxia LLE: Was 2-/5 proximal distal, 0/5 today (?limited due to participation) Difficult to assess tone due to patient resistance  Assessment/Plan: 1. Functional deficits secondary to [facility] which require 3+ hours per day of interdisciplinary therapy in a comprehensive inpatient rehab setting.  Physiatrist is providing close team supervision and 24 hour management of active medical problems listed below.  Physiatrist and rehab team continue to assess barriers to discharge/monitor patient progress toward functional and medical goals  Care Tool:  Bathing    Body parts bathed by patient: Abdomen, Front perineal area, Left upper leg, Face   Body parts bathed by helper:  Right arm, Left arm, Chest, Buttocks, Right lower leg, Left lower leg     Bathing assist Assist Level: Total Assistance - Patient < 25%     Upper Body Dressing/Undressing Upper body dressing   What is the patient wearing?: Pull over shirt    Upper body assist Assist Level: Total Assistance - Patient < 25%    Lower Body Dressing/Undressing Lower body dressing      What is the patient wearing?: Pants     Lower body assist Assist for lower body dressing: Total Assistance - Patient < 25%     Toileting Toileting    Toileting assist Assist for toileting: Dependent - Patient 0%     Transfers Chair/bed transfer  Transfers assist     Chair/bed transfer assist level: Dependent - mechanical lift(2 helpers in stedy)     Locomotion Ambulation   Ambulation assist   Ambulation activity did not occur: Safety/medical concerns          Walk 10 feet activity   Assist  Walk 10 feet activity did not occur: Safety/medical concerns        Walk 50 feet activity   Assist Walk 50 feet with 2 turns activity did not occur: Safety/medical concerns         Walk 150 feet activity   Assist Walk 150 feet activity did not occur: Safety/medical concerns         Walk 10 feet on uneven surface  activity   Assist Walk 10 feet on uneven surfaces activity did not occur: Safety/medical concerns         Wheelchair  Assist Will patient use wheelchair at discharge?: Yes Type of Wheelchair: Manual    Wheelchair assist level: Dependent - Patient 0% Max wheelchair distance: 150 ft    Wheelchair 50 feet with 2 turns activity    Assist        Assist Level: Dependent - Patient 0%   Wheelchair 150 feet activity     Assist      Assist Level: Dependent - Patient 0%    Medical Problem List and Plan: 1.  Left-sided weakness secondary to left greater than right subdural hematoma.  Status post cerebral angiogram balloon angioplasty  Continue CIR  15/17  Bilateral PRAFO  Team conference today to discuss current and goals and coordination of care, home and environmental barriers, and discharge planning with nursing, case manager, and therapies.  2.  Antithrombotics: -DVT/anticoagulation: Lovenox initiated 01/29/2020             -antiplatelet therapy: Aspirin 325 mg daily 3. Pain Management: Oxycodone as needed, Robaxin as needed. Heating pad to relax tight paraspinals of cervical and lumbar spine  Changed Oxycodone to Norco 7.5 mg/325 mg q4 hours per son request since been so sedated.  Controlled on 5/26 4. Mood: Provide emotional support             -antipsychotic agents: N/A  Zoloft daily.  5. Neuropsych: This patient is not capable of making decisions on his own behalf. 6. Skin/Wound Care: Routine skin checks 7. Fluids/Electrolytes/Nutrition: Routine in and outs   Carb modified diet- p.o. intake improved. 8.  Diabetes mellitus with hyperglycemia.  Hemoglobin A1c 7.7.  Check blood sugars before meals and at bedtime.  Patient on Tresiba 35 units daily, Glucotrol 10 mg daily, Actos 45 mg daily, Janumet 50-1000 mg twice daily, NovoLog 75 units daily prior to admission.               Levemir 31U BID, decreased to 20 twice daily on 5/26  NovoLog 3 units 3 times daily started on 5/26  Very labile on 5/26   CBG (last 3)  Recent Labs    02/12/20 2111 02/13/20 0612 02/13/20 0811  GLUCAP 332* 109* 103*   9.  Seizure prophylaxis. Keppra 500 mg twice daily, decreased to 250 twice daily on 5/25 10.  Arrhythmia/wide-complex tachycardia.    Amiodarone 200 mg daily x4 weeks (~6/12).  Follow-up cardiology services 11.  Hypertension with orthostasis.  Autoregulate on Nimotop.  Elevated at rest, however orthostasis noted by therapies, will avoid overcorrection   TEDS and abdominal binder as needed   Labile on 5/26 12.  Hyperlipidemia.  Crestor/fenofibrate 13. Mild vasospasm evident on 5/14 vascular transcranial doppler. 14.  Spasticity:  Started baclofen 5mg  TID for pain relief and spasticity, changed to nightly due to ?lethargy.  Will consider d/cing in future if appropriate 15.  Hyponatremia  Sodium 135 on 5/21  Continue to monitor 16.  Transaminitis  ALT elevated on 5/17  Continue to monitor 17.  Acute blood loss anemia  Hemoglobin 11.6 on 5/25  Continue to monitor  LOS: 11 days A FACE TO FACE EVALUATION WAS PERFORMED  Terasa Orsini 6/25 02/13/2020, 8:48 AM

## 2020-02-13 NOTE — Progress Notes (Signed)
Physical Therapy Session Note  Patient Details  Name: Nicholas [name] MRN: [id number] Date of Birth: [birth year]  Today's Date: 02/13/2020 PT Individual Time: 1035-1130 PT Individual Time Calculation (min): 55 min   Short Term Goals: Week 2:  PT Short Term Goal 1 (Week 2): Patient to be able to roll both ways with no more than ModAx2 and use of bed features PT Short Term Goal 2 (Week 2): patient to be able to get to/from EOB with no more than ModAx2 and use of bed features PT Short Term Goal 3 (Week 2): Patient to be able to transfer from bed to chair with no more than ModAx2 and least restrictive technqiue PT Short Term Goal 4 (Week 2): Patient to tolerate standing for at least 2 minutes in standing frame  Skilled Therapeutic Interventions/Progress Updates: Pt presented in bed sleeping with son present agreeable to therapy. Pt noted to be incontinent of bladder, performed rolling L/R with modA x 2 with increased time and HOH assist for sequencing. Peri-care performed total A. PTA donned TED hose, pants, and shoes total A with pt performing rolling to L/R to allow pants to be pulled over hips with maxA x 1 increased time and cues. Performed supine to sit with heavy use of bed features and increased time maxA x 1 with second person present for safety. Pt required mod/maxA for sitting balance due to heavy R lateral or posteriorlateral lean. PTA set up SB and performed SB transfer maxA x 1 with second person present for safety to TIS with son providing verbal instruction "1,2,3 slide". Once in TIS PTA provided manual stretching of hamstrings and heel cords 1 min x 3 bilaterally. Pt then turned to mirror and provided facilitation and multimodal cues to turn head to neutral and to try to maintain. Pt set up and participated in STS in Stedy x 1 with maxA x 2 with pt able to maintain full stand for approx 15 sec. Pt required facilitation at hips to initiate stand and verbal cues for full extension of BLE. Once  completed pt set up with belt alarm on, call bell within reach and needs met.      Therapy Documentation Precautions:  Precautions Precautions: None Precaution Comments: Lt hemiparesis with Lt neglect ; watch BP Restrictions Weight Bearing Restrictions: No Other Position/Activity Restrictions: keep BP >160 General:   Vital Signs:  Pain: Pain Assessment Pain Scale: Faces Faces Pain Scale: No hurt Mobility:   Locomotion :    Trunk/Postural Assessment :    Balance:   Exercises:   Other Treatments:      Therapy/Group: Individual Therapy  Rosita DeChalus 02/13/2020, 2:32 PM

## 2020-02-13 NOTE — Care Management (Signed)
Met with patient's nurse to review nursing concerns: incont of B+B; timed toileting is not effective due to lethargy; continue to monitor incontinence and prompt elimination on a regular basis. Skin; stage 1 has not progressed and patient is on an air mattress. Continue to promote change in positioning and limit lying on his back and increased intake for nutritional needs. Family educational needs assessed and notebooks and handouts available to nursing to review with family.

## 2020-02-13 NOTE — Progress Notes (Signed)
Speech Language Pathology Daily Session Note  Patient Details  Name: Nicholas [name] MRN: [id number] Date of Birth: Dec 29, 1950  Today's Date: 02/13/2020 SLP Individual Time: 1130-1200 SLP Individual Time Calculation (min): 30 min  Short Term Goals: Week 2: SLP Short Term Goal 1 (Week 2): Pt will sustain attention to functional tasks for 7 minute intervals with Max A cues. SLP Short Term Goal 2 (Week 2): Pt will recall new and daily infor mation with Max A for use of aids or strategies. SLP Short Term Goal 3 (Week 2): Pt will scan left visual field in 7/10 opportunities provided Mod A multimodal cues. SLP Short Term Goal 4 (Week 2): Patient will follow 2 step directions with 80% accuracy and max A. SLP Short Term Goal 5 (Week 2): Pt will demonstrate functional problem solving skills in basic familiar situations with Mod A multimodal cues.  Skilled Therapeutic Interventions: Pt was seen for skilled ST targeting cognitive-linguistic goals. Pt greeted therapist and engaged in polite social greetings, but otherwise did not verbally respond to questions (although did follow directions). He used a calendar to orient to today's date accurately with Min A verbal and some tactile assist using his finger to track on a calendar. Pt recalled occurrence of PT session immediately prior to ST and that is was first session of the day. He sorted picture tiles by categories X4 from field of 9 and 12 with overall Max A multimodal cueing for scanning, locating items on left, error awareness, and functional problem solving. Pt left sitting in chair with son present and needs within reach. Continue per current plan of care.        Pain Pain Assessment Pain Scale: Faces Faces Pain Scale: No hurt  Therapy/Group: Individual Therapy  [name] 02/13/2020, 7:00 AM

## 2020-02-13 NOTE — Progress Notes (Signed)
Occupational Therapy Session Note  Patient Details  Name: Nicholas [name] MRN: [id number] Date of Birth: [birth year]  Today's Date: 02/13/2020 OT Individual Time: 1415-1500 OT Individual Time Calculation (min): 45 min    Short Term Goals: Week 2:  OT Short Term Goal 1 (Week 2): Pt will maintain static sitting balance (unsupported) with min A in prep for dyanamic sititng balance in ADL OT Short Term Goal 2 (Week 2): Pt will don shirt with mod assist. OT Short Term Goal 3 (Week 2): Pt will perform sliding board transfers with max assist +1. OT Short Term Goal 4 (Week 2): Pt will wash UB with mod assist using BUE.   Skilled Therapeutic Interventions/Progress Updates:    Upon entering the room, pt's son present and reports needing personal care. Pt also reports, " My butt hurts". OT repositioned pt and slide board to the R with max A and second helper to steady equipment. Pt with increased time to initiate but did assist with transfer. Sit >supine with +2 for safety. Pt had not been incontinent and RN scanning bladder while pt is supine. OT doffed soiled hospital gown and pt initiated putting arms through clean gown this session. Session with focus on mod - max cuing to attend to L side , reach across midline , and bilateral integration task to open/flip pages of cards. OT repositioned pt for comfort and son present in room as therapist exits. All needs within reach.   Therapy Documentation Precautions:  Precautions Precautions: None Precaution Comments: Lt hemiparesis with Lt neglect ; watch BP Restrictions Weight Bearing Restrictions: No Other Position/Activity Restrictions: keep BP >160 Vital Signs: Therapy Vitals Temp: 98.5 F (36.9 C) Pulse Rate: 92 Resp: 18 BP: 128/71 Patient Position (if appropriate): Lying Oxygen Therapy SpO2: 95 % O2 Device: Room Air ADL: ADL Eating: Maximal assistance Grooming: Maximal assistance Upper Body Bathing: Maximal cueing, Moderate  assistance Where Assessed-Upper Body Bathing: Shower Lower Body Bathing: Maximal assistance Where Assessed-Lower Body Bathing: Shower Upper Body Dressing: Moderate assistance Where Assessed-Upper Body Dressing: Wheelchair Lower Body Dressing: Dependent Where Assessed-Lower Body Dressing: Bed level Toileting: Not assessed Toilet Transfer: Not assessed Walk-In Shower Transfer: Not assessed Film/video editor Method: Other (comment)(roll in shower chair) Astronomer: Other (comment)   Therapy/Group: Individual Therapy  Alen Bleacher 02/13/2020, 4:42 PM

## 2020-02-13 NOTE — Patient Care Conference (Signed)
Inpatient RehabilitationTeam Conference and Plan of Care Update Date: 02/13/2020   Time: 2:47 PM    Patient Name: Nicholas [name]      Medical Record Number: [id number]  Date of Birth: 02/19/1951 Sex: Male         Room/Bed: 4W01C/4W01C-01 Payor Info: Payor: BLUE CROSS BLUE [employer]IELD / Plan: BCBS COMM PPO / Product Type: *No Product type* /    Admit Date/Time:  02/02/2020  3:33 PM  Primary Diagnosis:  SDH (subdural hematoma) (HCC)  Patient Active Problem List   Diagnosis Date Noted  . Seizure prophylaxis   . Spastic hemiparesis (HCC)   . Labile blood pressure   . Labile blood glucose   . Pressure injury of skin 02/05/2020  . Orthostasis   . Acute blood loss anemia   . Hypertensive crisis   . Hyponatremia   . SDH (subdural hematoma) (HCC)   . Transaminitis   . Wide-complex tachycardia (HCC)   . Intracranial vasospasm   . Essential hypertension   . Controlled type 2 diabetes mellitus with hyperglycemia (HCC)   . Hepatitis   . Abdominal distention   . NSVT (nonsustained ventricular tachycardia) (HCC)   . Rate-related bundle branch block   . Acute hypoxemic respiratory failure (HCC)   . Aneurysm (HCC)   . SAH (subarachnoid hemorrhage) (HCC) 01/14/2020  . Other and unspecified hyperlipidemia 07/17/2013  . Need for prophylactic vaccination and inoculation against influenza 07/17/2013  . Type II or unspecified type diabetes mellitus without mention of complication, not stated as uncontrolled 04/12/2013  . 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 Elevated transaminase measurement 04/12/2013  . Type II or unspecified type diabetes mellitus without mention of complication, uncontrolled 01/05/2013  . Essential hypertension, benign 01/05/2013  . BPH (benign prostatic hyperplasia) 01/05/2013  . Overweight(278.02) 01/05/2013    Expected Discharge Date: Expected Discharge Date: 03/04/20  Team Members Present: Physician leading conference: Dr. Maryla Morrow Care Coodinator Present: Cheyenne Adas, RN,  BSN, CRRN;Christina Vita Barley, BSW Nurse Present: Other (comment)(Nikki Archie, RN) PT Present: Nedra Hai, PT OT Present: Primitivo Gauze, OT SLP Present: Suzzette Righter, CF-SLP PPS Coordinator present : Edson Snowball, PT     Current Status/Progress Goal Weekly Team Focus  Bowel/Bladder   Patient is incontinent of bowel and bladder. LBM 02/12/20  Patient will regain bowel and bladder continence  Assess toileting needs Qshift and PRN.  Implement timed toileting and strict pericare to maintain skin integrity.   Swallow/Nutrition/ Hydration   full supervision due to cognition         ADL's   improved energy with decreased lethargy, but continues to have severely impaired attention, initiation, praxis and therefore continues to need total A of 2 for LB self care, progressed to max A with UB self care  min A toilet transfer, dressing, bathing; mod A toileting and shower transfer (goals may need to be downgraded to mod -max A)  postural training, balance, L side awareness,  LUE NMR, visual scanning,  cognition, ADL training   Mobility   still maxAx2 for all aspects of functional mobility, MinA to CGA to maintain sitting balance x10 minutes, able ot maintain standing for 10-15 seconds in stedy with max cues/assist/facilitation  mod-maxA, WC with S  stilll low level mobility and transfers, tolerance to upright/OOB, standing tolerance   Communication   Min-Mod verbal initiation  Min  verbal initiation and overall output   Safety/Cognition/ Behavioral Observations  Mod-Max  Min  initiation, sustained attention, functional problem solving, recall   Pain  Patient's pain is well managed with Tylenol and Norco PRN.  Patient will continue with satisfactory pain levels less than 3  Assess pain Qshift and PRN.  Anticipate need for pain management with movement and care.   Skin   Patient has Stage 1 to sacrum  Maintain skin healing and integrity with peri care, barrier creams and foam dressing  Assess  skin Qshift and PRN.  Continue frequent turns and repositions; strict peri care.    Rehab Goals Patient on target to meet rehab goals: Yes Rehab Goals Revised: Patient progressing *See Care Plan and progress notes for long and short-term goals.     Barriers to Discharge  Current Status/Progress Possible Resolutions Date Resolved   Nursing                  PT  Home environment access/layout;Behavior;Inaccessible home environment;Incontinence  still very heavy levels of physical assist and very slow progress with therapy; delayed processing time and reduced cognition              OT                  SLP                Care Coordinator Medical stability              Discharge Planning/Teaching Needs:  Goal is to disccharge home with family to provide care.  Will schedule if recommended   Team Discussion:  Blood pressures fluctuate; continues to have back pain. Lethargy improved however progress is slow.  Md adjusting seizure medication. Patient incontinent of B+B; attempting prompted toileting has been difficult to manage as patient has a delayed processing time. Episodes of staring off into space noted and delayed response to questions or commands. Appears to hear and understand as he will eventually respond. Still at max-total assist overall.  Revisions to Treatment Plan:  SLP: downgraded goals to basic conversation and max assist.     Medical Summary Current Status: Left-sided weakness secondary to left greater than right subdural hematoma.  Status post cerebral angiogram balloon angioplasty Weekly Focus/Goal: Improve mobility, BP, LFTs, CBGs, Na+  Barriers to Discharge: Behavior;Medical stability;Incontinence   Possible Resolutions to Barriers: Therapies, follow labs, optimize BP/DM meds   Continued Need for Acute Rehabilitation Level of Care: The patient requires daily medical management by a physician with specialized training in physical medicine and rehabilitation for the  following reasons: Direction of a multidisciplinary physical rehabilitation program to maximize functional independence : Yes Medical management of patient stability for increased activity during participation in an intensive rehabilitation regime.: Yes Analysis of laboratory values and/or radiology reports with any subsequent need for medication adjustment and/or medical intervention. : Yes   I attest that I was present, lead the team conference, and concur with the assessment and plan of the team.   Dorien Chihuahua B 02/13/2020, 2:47 PM

## 2020-02-13 NOTE — Progress Notes (Signed)
Team Conference Report to Patient/Family  Team Conference discussion was reviewed with the patient and caregiver, including goals, any changes in plan of care and target discharge date.  Patient and caregiver express understanding and are in agreement.  The patient has a target discharge date of 03/04/20.  Andria Rhein 02/13/2020, 2:08 PM Patient ID: Nicholas [name], male   DOB: August 30, 1951, 69 y.o.   MRN: [id number]

## 2020-02-14 ENCOUNTER — Inpatient Hospital Stay (HOSPITAL_COMMUNITY): Payer: BC Managed Care – PPO | Admitting: Occupational Therapy

## 2020-02-14 ENCOUNTER — Inpatient Hospital Stay (HOSPITAL_COMMUNITY): Payer: BC Managed Care – PPO | Admitting: Physical Therapy

## 2020-02-14 ENCOUNTER — Inpatient Hospital Stay (HOSPITAL_COMMUNITY): Payer: BC Managed Care – PPO | Admitting: Speech Pathology

## 2020-02-14 LAB — GLUCOSE, CAPILLARY
Glucose-Capillary: 97 mg/dL (ref 70–99)
Glucose-Capillary: 186 mg/dL — ABNORMAL HIGH (ref 70–99)
Glucose-Capillary: 244 mg/dL — ABNORMAL HIGH (ref 70–99)
Glucose-Capillary: 206 mg/dL — ABNORMAL HIGH (ref 70–99)

## 2020-02-14 MED ORDER — AMANTADINE HCL 100 MG PO CAPS
100.0000 mg | ORAL_CAPSULE | Freq: Two times a day (BID) | ORAL | Status: DC
  Administered 2020-02-14 – 2020-02-16 (×4): 100 mg via ORAL
  Filled 2020-02-14 (×4): qty 1

## 2020-02-14 MED ORDER — MUSCLE RUB 10-15 % EX CREA
TOPICAL_CREAM | CUTANEOUS | Status: DC | PRN
  Filled 2020-02-14: qty 85

## 2020-02-14 NOTE — Progress Notes (Signed)
Occupational Therapy Session Note  Patient Details  Name: Nicholas [name] MRN: [id number] Date of Birth: 1951/06/22  Today's Date: 02/14/2020 OT Individual Time: 1015-1120 OT Individual Time Calculation (min): 65 min    Short Term Goals: Week 2:  OT Short Term Goal 1 (Week 2): Pt will maintain static sitting balance (unsupported) with min A in prep for dyanamic sititng balance in ADL OT Short Term Goal 2 (Week 2): Pt will don shirt with mod assist. OT Short Term Goal 3 (Week 2): Pt will perform sliding board transfers with max assist +1. OT Short Term Goal 4 (Week 2): Pt will wash UB with mod assist using BUE.  Skilled Therapeutic Interventions/Progress Updates:    Pt received in w/c ready for therapy. He was alert holding B eyes open.  Positioned pt at sink for oral care, and to don a shirt.  In tilt and space w/c with pt sitting fairly upright, he can hold his balance. As soon as he is trying to focus on a functional task he is unable to maintain and falls to his L due to his R arm pushing away.  Tried various strategies to adjust his midline to avoid pushing.  The most effective positioning was a wedge under L side of cushion and a wedge support behind L side of trunk.    He was more involved in the tasks today but continues to need multiple cues to slow processing, apraxia, L neglect.    Pt taken to gym to focus on postural control and active use of LUE.  Tried zoom ball and pt did attend to task well and seemed to try to use his arms but he could not actively lift L arm out to side to move the zoom ball (pt has demonstrated the ability to lift his arm previously).  Used light weight dowel for ball bounce activity with slightly increased L shoulder activity.  Again, his attention and interest in activity was fairly good.    For isolated LUE focus, used UE ranger with mod A to guide arm as pt demonstrating apraxia with simple movement patterns of reaching forward and back.    Used mirror for  positioning feedback,  Worked on midline positioning with mod cues and A.    Pt taken back to room and his wife was present to S pt for the 15 minutes until his next therapy.   Therapy Documentation Precautions:  Precautions Precautions: None Precaution Comments: Lt hemiparesis with Lt neglect ; watch BP Restrictions Weight Bearing Restrictions: No Other Position/Activity Restrictions: keep BP >160   Pain: Pain Assessment Pain Scale: 0-10 Pain Score: 0-No pain    Therapy/Group: Individual Therapy  Kodey Xue 02/14/2020, 10:11 AM

## 2020-02-14 NOTE — Progress Notes (Signed)
[facility] PHYSICAL MEDICINE & REHABILITATION PROGRESS NOTE   Subjective/Complaints: Patient seen laying in bed this morning.  He states he slept well overnight.  He keeps his right eye open today.  He notes neck soreness.  ROS: + Sore neck.  Denies CP, SOB, N/V/D  Objective:   No results found. Recent Labs    02/12/20 0518  WBC 6.0  HGB 11.6*  HCT 35.0*  PLT 294   No results for input(s): NA, K, CL, CO2, GLUCOSE, BUN, CREATININE, CALCIUM in the last 72 hours.  Intake/Output Summary (Last 24 hours) at 02/14/2020 1142 Last data filed at 02/14/2020 0811 Gross per 24 hour  Intake 1076 ml  Output -  Net 1076 ml     Physical Exam: Vital Signs Blood pressure (!) 148/79, pulse 88, temperature 99.1 F (37.3 C), temperature source Oral, resp. rate 16, height 6' (1.829 m), weight 89.8 kg, SpO2 100 %. Constitutional: No distress . Vital signs reviewed. HENT: Normocephalic.  Atraumatic. Eyes: EOMI. No discharge. Cardiovascular: No JVD. Respiratory: Normal effort.  No stridor. GI: Non-distended. Skin: Warm and dry.  Intact. Psych: Flat, slowed, delayed, some improvement Musc: No edema in extremities.  No tenderness in extremities. Neurological: Alert L facial droop Right lean Motor: LUE: 4 -/5 proximal distal with apraxia LLE: 2-/5 proximal distal, 0/5 today  Difficult to assess tone due to patient resistance  Assessment/Plan: 1. Functional deficits secondary to [facility] which require 3+ hours per day of interdisciplinary therapy in a comprehensive inpatient rehab setting.  Physiatrist is providing close team supervision and 24 hour management of active medical problems listed below.  Physiatrist and rehab team continue to assess barriers to discharge/monitor patient progress toward functional and medical goals  Care Tool:  Bathing    Body parts bathed by patient: Abdomen, Front perineal area, Left upper leg, Face   Body parts bathed by helper: Right arm, Left arm, Chest,  Buttocks, Right lower leg, Left lower leg     Bathing assist Assist Level: Total Assistance - Patient < 25%     Upper Body Dressing/Undressing Upper body dressing   What is the patient wearing?: Pull over shirt    Upper body assist Assist Level: Total Assistance - Patient < 25%    Lower Body Dressing/Undressing Lower body dressing      What is the patient wearing?: Pants     Lower body assist Assist for lower body dressing: Total Assistance - Patient < 25%     Toileting Toileting    Toileting assist Assist for toileting: Dependent - Patient 0%     Transfers Chair/bed transfer  Transfers assist     Chair/bed transfer assist level: 2 Helpers     Locomotion Ambulation   Ambulation assist   Ambulation activity did not occur: Safety/medical concerns          Walk 10 feet activity   Assist  Walk 10 feet activity did not occur: Safety/medical concerns        Walk 50 feet activity   Assist Walk 50 feet with 2 turns activity did not occur: Safety/medical concerns         Walk 150 feet activity   Assist Walk 150 feet activity did not occur: Safety/medical concerns         Walk 10 feet on uneven surface  activity   Assist Walk 10 feet on uneven surfaces activity did not occur: Safety/medical concerns         Wheelchair     Assist Will  patient use wheelchair at discharge?: Yes Type of Wheelchair: Manual    Wheelchair assist level: Dependent - Patient 0% Max wheelchair distance: 150 ft    Wheelchair 50 feet with 2 turns activity    Assist        Assist Level: Dependent - Patient 0%   Wheelchair 150 feet activity     Assist      Assist Level: Dependent - Patient 0%    Medical Problem List and Plan: 1.  Left-sided weakness secondary to left greater than right subdural hematoma.  Status post cerebral angiogram balloon angioplasty  Continue CIR 15/17  Bilateral PRAFO 2.   Antithrombotics: -DVT/anticoagulation: Lovenox initiated 01/29/2020             -antiplatelet therapy: Aspirin 325 mg daily 3. Pain Management: Oxycodone as needed, Robaxin as needed. Heating pad to relax tight paraspinals of cervical and lumbar spine  Changed Oxycodone to Norco 7.5 mg/325 mg q4 hours per son request since been so sedated.  Muscle rub ordered on 5/27  Controlled on 5/26 4. Mood: Provide emotional support             -antipsychotic agents: N/A  Zoloft daily.   Amantadine 100 with breakfast and lunch started on 5/26 after discussion with therapies-will monitor for efficacy 5. Neuropsych: This patient is not capable of making decisions on his own behalf. 6. Skin/Wound Care: Routine skin checks 7. Fluids/Electrolytes/Nutrition: Routine in and outs   Carb modified diet- p.o. intake improved. 8.  Diabetes mellitus with hyperglycemia.  Hemoglobin A1c 7.7.  Check blood sugars before meals and at bedtime.  Patient on Tresiba 35 units daily, Glucotrol 10 mg daily, Actos 45 mg daily, Janumet 50-1000 mg twice daily, NovoLog 75 units daily prior to admission.               Levemir 31U BID, decreased to 20 twice daily on 5/26  NovoLog 3 units 3 times daily started on 5/26  Labile, but?  Stabilizing on 5/27   CBG (last 3)  Recent Labs    02/13/20 1653 02/13/20 2040 02/14/20 0635  GLUCAP 176* 90 97   9.  Seizure prophylaxis. Keppra 500 mg twice daily, decreased to 250 twice daily on 5/25 10.  Arrhythmia/wide-complex tachycardia.    Amiodarone 200 mg daily x4 weeks (~6/12).  Follow-up cardiology services 11.  Hypertension with orthostasis.  Autoregulate on Nimotop.  Elevated at rest, however orthostasis noted by therapies, will avoid overcorrection   TEDS and abdominal binder as needed   Slightly labile on 5/27 12.  Hyperlipidemia.  Crestor/fenofibrate 13. Mild vasospasm evident on 5/14 vascular transcranial doppler. 14. Spasticity:  Started baclofen 5mg  TID for pain relief and  spasticity, changed to nightly due to ?lethargy.  Will consider d/cing in future if appropriate 15.  Hyponatremia  Sodium 135 on 5/21, labs ordered for tomorrow  Continue to monitor 16.  Transaminitis  ALT elevated on 5/17, labs ordered for tomorrow  Continue to monitor 17.  Acute blood loss anemia  Hemoglobin 11.6 on 5/25  Continue to monitor  LOS: 12 days A FACE TO FACE EVALUATION WAS PERFORMED  Ankit 6/25 02/14/2020, 11:42 AM

## 2020-02-14 NOTE — Progress Notes (Signed)
Physical Therapy Session Note  Patient Details  Name: Nicholas [name] MRN: [id number] Date of Birth: 11-30-1950  Today's Date: 02/14/2020 PT Individual Time: 1132-1200 PT Individual Time Calculation (min): 28 min   Short Term Goals: Week 2:  PT Short Term Goal 1 (Week 2): Patient to be able to roll both ways with no more than ModAx2 and use of bed features PT Short Term Goal 2 (Week 2): patient to be able to get to/from EOB with no more than ModAx2 and use of bed features PT Short Term Goal 3 (Week 2): Patient to be able to transfer from bed to chair with no more than ModAx2 and least restrictive technqiue PT Short Term Goal 4 (Week 2): Patient to tolerate standing for at least 2 minutes in standing frame  Skilled Therapeutic Interventions/Progress Updates:     Patient received in [facility], more awake and willing to participate in session today. Focused session on L attention and postural control from WC level. Had him turn his head to search for specific playing cards on the left side and then take the card and reach far to the right to place card on surface of table to help correct hard L lean. Became very fatigued during session with strong anterior and L lean needing MaxA to correct. Left up in [facility] with all needs met, seatbelt alarm active and family present this morning.   Therapy Documentation Precautions:  Precautions Precautions: None Precaution Comments: Lt hemiparesis with Lt neglect ; watch BP Restrictions Weight Bearing Restrictions: No Other Position/Activity Restrictions: keep BP >160 Pain: Pain Assessment Pain Scale: 0-10 Pain Score: 0-No pain Faces Pain Scale: No hurt    Therapy/Group: Individual Therapy   Windell Norfolk, DPT, PN1   Supplemental Physical Therapist [facility]    Pager [phone] Acute Rehab Office [phone]    02/14/2020, 4:04 PM

## 2020-02-14 NOTE — Progress Notes (Signed)
Speech Language Pathology Daily Session Note  Patient Details  Name: Nicholas [name] MRN: [id number] Date of Birth: [birth year]  Today's Date: 02/14/2020 SLP Individual Time: 1340-1407 SLP Individual Time Calculation (min): 27 min  Short Term Goals: Week 2: SLP Short Term Goal 1 (Week 2): Pt will sustain attention to functional tasks for 7 minute intervals with Max A cues. SLP Short Term Goal 2 (Week 2): Pt will recall new and daily infor mation with Max A for use of aids or strategies. SLP Short Term Goal 3 (Week 2): Pt will scan left visual field in 7/10 opportunities provided Mod A multimodal cues. SLP Short Term Goal 4 (Week 2): Patient will follow 2 step directions with 80% accuracy and max A. SLP Short Term Goal 5 (Week 2): Pt will demonstrate functional problem solving skills in basic familiar situations with Mod A multimodal cues.  Skilled Therapeutic Interventions: Pt seen for skilled ST targeting cognition. Pt was exceptionally fatigued today, requiring Max A multimodal cueing in order to arousal and initiate during tasks. He was minimally verbal, however did provide social greeting. Pt only able to sustain attention to functional and structured tasks for 10-20 second intervals. During a basic novel card game (WAR) pt identified higher value cards from field of 2 with 2/3 accuracy and Max A for sustained attention. Pt unable to use calendar functionally. Discussed fluctuations in fatigue and verbal output with his wife, who was present and supportive at bedside. Pt left laying in bed with alarm set and needs within reach, wife still present. Continue per current plan of care.      Pain Pain Assessment Pain Scale: Faces Faces Pain Scale: No hurt  Therapy/Group: Individual Therapy  [name] 02/14/2020, 7:26 AM

## 2020-02-14 NOTE — Progress Notes (Signed)
Physical Therapy Session Note  Patient Details  Name: Nicholas [name] MRN: [id number] Date of Birth: Jan 27, 1951  Today's Date: 02/14/2020 PT Individual Time: 0800-0855 PT Individual Time Calculation (min): 55 min   Short Term Goals: Week 2:  PT Short Term Goal 1 (Week 2): Patient to be able to roll both ways with no more than ModAx2 and use of bed features PT Short Term Goal 2 (Week 2): patient to be able to get to/from EOB with no more than ModAx2 and use of bed features PT Short Term Goal 3 (Week 2): Patient to be able to transfer from bed to chair with no more than ModAx2 and least restrictive technqiue PT Short Term Goal 4 (Week 2): Patient to tolerate standing for at least 2 minutes in standing frame  Skilled Therapeutic Interventions/Progress Updates:    Patient received in bed, nursing staff present and assisted in changing brief. Able to roll to L with MinA/Max cues however continues to need MaxA/max cues for rolling to R side. Brief changed and pants donned with totalA. Required maxAx2 for supine to sit and MaxA to maintain sitting balance today due to poor initiation and heavy anterior and left lean with no initiation to correct. Attempted postural corrections in mirror in room with mixed results due to very slow processing and interaction today, RN reports he did not rest well last night at all. Otherwise continued working on sitting postural corrections, then left up in T[facility] with seatbelt active and all needs met at nursing station.  Therapy Documentation Precautions:  Precautions Precautions: None Precaution Comments: Lt hemiparesis with Lt neglect ; watch BP Restrictions Weight Bearing Restrictions: No Other Position/Activity Restrictions: keep BP >160  Pain Assessment Pain Scale: Faces Faces Pain Scale: No hurt    Therapy/Group: Individual Therapy   Windell Norfolk, DPT, PN1   Supplemental Physical Therapist [place]    Pager [phone] Acute Rehab Office  [phone]    02/14/2020, 3:57 PM

## 2020-02-15 ENCOUNTER — Inpatient Hospital Stay (HOSPITAL_COMMUNITY): Payer: BC Managed Care – PPO | Admitting: Speech Pathology

## 2020-02-15 ENCOUNTER — Inpatient Hospital Stay (HOSPITAL_COMMUNITY): Payer: BC Managed Care – PPO | Admitting: Occupational Therapy

## 2020-02-15 ENCOUNTER — Inpatient Hospital Stay (HOSPITAL_COMMUNITY): Payer: BC Managed Care – PPO | Admitting: Physical Therapy

## 2020-02-15 LAB — COMPREHENSIVE METABOLIC PANEL
ALT: 32 U/L (ref 0–44)
AST: 22 U/L (ref 15–41)
Albumin: 3.1 g/dL — ABNORMAL LOW (ref 3.5–5.0)
Alkaline Phosphatase: 68 U/L (ref 38–126)
Anion gap: 11 (ref 5–15)
BUN: 22 mg/dL (ref 8–23)
CO2: 26 mmol/L (ref 22–32)
Calcium: 9.2 mg/dL (ref 8.9–10.3)
Chloride: 102 mmol/L (ref 98–111)
Creatinine, Ser: 0.68 mg/dL (ref 0.61–1.24)
GFR calc Af Amer: >60 mL/min (ref >60)
GFR calc non Af Amer: >60 mL/min (ref >60)
Glucose, Bld: 85 mg/dL (ref 70–99)
Potassium: 3.6 mmol/L (ref 3.5–5.1)
Sodium: 139 mmol/L (ref 135–145)
Total Bilirubin: 0.3 mg/dL (ref 0.3–1.2)
Total Protein: 6.6 g/dL (ref 6.5–8.1)

## 2020-02-15 LAB — GLUCOSE, CAPILLARY
Glucose-Capillary: 71 mg/dL (ref 70–99)
Glucose-Capillary: 88 mg/dL (ref 70–99)
Glucose-Capillary: 111 mg/dL — ABNORMAL HIGH (ref 70–99)
Glucose-Capillary: 90 mg/dL (ref 70–99)
Glucose-Capillary: 125 mg/dL — ABNORMAL HIGH (ref 70–99)

## 2020-02-15 MED ORDER — INSULIN ASPART 100 UNIT/ML ~~LOC~~ SOLN
5.0000 [IU] | Freq: Three times a day (TID) | SUBCUTANEOUS | Status: DC
  Administered 2020-02-15 – 2020-02-24 (×23): 5 [IU] via SUBCUTANEOUS

## 2020-02-15 MED ORDER — INSULIN DETEMIR 100 UNIT/ML ~~LOC~~ SOLN
25.0000 [IU] | Freq: Two times a day (BID) | SUBCUTANEOUS | Status: DC
  Administered 2020-02-15 – 2020-02-21 (×12): 25 [IU] via SUBCUTANEOUS
  Filled 2020-02-15 (×13): qty 0.25

## 2020-02-15 NOTE — Progress Notes (Signed)
Patient ID: Nicholas [name], male   DOB: Sep 25, 1950, 69 y.o.   MRN: [id number]  Sw provided patient spouse with team past conference notes

## 2020-02-15 NOTE — Progress Notes (Addendum)
[place] PHYSICAL MEDICINE & REHABILITATION PROGRESS NOTE   Subjective/Complaints: Patient seen sitting up in bed this AM.  He states he slept well overnight.  He does not recall if muscle rub helped with his neck.   ROS: Denies neck pain, CP, SOB, N/V/D  Objective:   No results found. No results for input(s): WBC, HGB, HCT, PLT in the last 72 hours. Recent Labs    02/15/20 0655  NA 139  K 3.6  CL 102  CO2 26  GLUCOSE 85  BUN 22  CREATININE 0.68  CALCIUM 9.2    Intake/Output Summary (Last 24 hours) at 02/15/2020 0928 Last data filed at 02/15/2020 0900 Gross per 24 hour  Intake 1056 ml  Output --  Net 1056 ml     Physical Exam: Vital Signs Blood pressure 138/79, pulse 82, temperature 98.1 F (36.7 C), resp. rate 16, height 6' (1.829 m), weight 89.8 kg, SpO2 98 %. Constitutional: No distress . Vital signs reviewed. HENT: Normocephalic.  Atraumatic. Eyes: Keeps right eye closed. No discharge. Cardiovascular: No JVD. Respiratory: Normal effort.  No stridor. GI: Non-distended. Skin: Warm and dry.  Intact. Psych: Flat, slowed, delayed Musc: No edema in extremities.  No tenderness in extremities. Neurological: Alert L facial droop Right lean Motor: LUE: 4 -/5 proximal distal with apraxia LLE: 1+-2-/5 proximal distal Difficult to assess tone due to patient resistance  Assessment/Plan: 1. Functional deficits secondary to [facility] which require 3+ hours per day of interdisciplinary therapy in a comprehensive inpatient rehab setting.  Physiatrist is providing close team supervision and 24 hour management of active medical problems listed below.  Physiatrist and rehab team continue to assess barriers to discharge/monitor patient progress toward functional and medical goals  Care Tool:  Bathing    Body parts bathed by patient: Abdomen, Front perineal area, Left upper leg, Face   Body parts bathed by helper: Right arm, Left arm, Chest, Buttocks, Right lower leg, Left  lower leg     Bathing assist Assist Level: Total Assistance - Patient < 25%     Upper Body Dressing/Undressing Upper body dressing   What is the patient wearing?: Pull over shirt    Upper body assist Assist Level: Maximal Assistance - Patient 25 - 49%    Lower Body Dressing/Undressing Lower body dressing      What is the patient wearing?: Pants     Lower body assist Assist for lower body dressing: Total Assistance - Patient < 25%     Toileting Toileting    Toileting assist Assist for toileting: Dependent - Patient 0%     Transfers Chair/bed transfer  Transfers assist     Chair/bed transfer assist level: 2 Helpers     Locomotion Ambulation   Ambulation assist   Ambulation activity did not occur: Safety/medical concerns          Walk 10 feet activity   Assist  Walk 10 feet activity did not occur: Safety/medical concerns        Walk 50 feet activity   Assist Walk 50 feet with 2 turns activity did not occur: Safety/medical concerns         Walk 150 feet activity   Assist Walk 150 feet activity did not occur: Safety/medical concerns         Walk 10 feet on uneven surface  activity   Assist Walk 10 feet on uneven surfaces activity did not occur: Safety/medical concerns         Wheelchair  Assist Will patient use wheelchair at discharge?: Yes Type of Wheelchair: Manual    Wheelchair assist level: Dependent - Patient 0% Max wheelchair distance: 150 ft    Wheelchair 50 feet with 2 turns activity    Assist        Assist Level: Dependent - Patient 0%   Wheelchair 150 feet activity     Assist      Assist Level: Dependent - Patient 0%    Medical Problem List and Plan: 1.  Left-sided weakness secondary to left greater than right subdural hematoma.  Status post cerebral angiogram balloon angioplasty  Continue CIR 15/17  Bilateral PRAFO 2.  Antithrombotics: -DVT/anticoagulation: Lovenox initiated  01/29/2020             -antiplatelet therapy: Aspirin 325 mg daily 3. Pain Management: Oxycodone as needed, Robaxin as needed. Heating pad to relax tight paraspinals of cervical and lumbar spine  Changed Oxycodone to Norco 7.5 mg/325 mg q4 hours per son request since been so sedated.  Muscle rub ordered on 5/27  Appears to be controlled on 5/28 4. Mood: Provide emotional support             -antipsychotic agents: N/A  Zoloft daily.   Amantadine 100 with breakfast and lunch started on 5/26 after discussion with therapies-will monitor for efficacy  Appears to have some improvement, discussed with therapies. 5. Neuropsych: This patient is not capable of making decisions on his own behalf. 6. Skin/Wound Care: Routine skin checks 7. Fluids/Electrolytes/Nutrition: Routine in and outs   Carb modified diet- p.o. intake improved. 8.  Diabetes mellitus with hyperglycemia.  Hemoglobin A1c 7.7.  Check blood sugars before meals and at bedtime.  Patient on Tresiba 35 units daily, Glucotrol 10 mg daily, Actos 45 mg daily, Janumet 50-1000 mg twice daily, NovoLog 75 units daily prior to admission.               Levemir 31U BID, decreased to 28 twice daily on 5/26, decreased to 25 BID on 5/28  NovoLog 3 units 3 times daily started on 5/26, increased to 5 TID on 5/28   CBG (last 3)  Recent Labs    02/14/20 2056 02/15/20 0617 02/15/20 0652  GLUCAP 206* 71 88   9.  Seizure prophylaxis. Keppra 500 mg twice daily, decreased to 250 twice daily on 5/25, d/ced on 5/28 10.  Arrhythmia/wide-complex tachycardia.    Amiodarone 200 mg daily x4 weeks (~6/12).  Follow-up cardiology services 11.  Hypertension with orthostasis.  Autoregulate on Nimotop.  Elevated at rest, however orthostasis noted by therapies, will avoid overcorrection   TEDS and abdominal binder as needed   Relatively controlled on 5/28 12.  Hyperlipidemia.  Crestor/fenofibrate 13. Mild vasospasm evident on 5/14 vascular transcranial doppler. 14.  Spasticity:  Started baclofen 5mg  TID for pain relief and spasticity, changed to nightly due to ?lethargy.  Will consider d/cing in future if appropriate 15.  Hyponatremia: Resolved  Sodium 139 on 5/28  Continue to monitor 16. Transaminitis: Resolved  LFTs WNL on 5/28  Continue to monitor 17.  Acute blood loss anemia  Hemoglobin 11.6 on 5/25  Continue to monitor  LOS: 13 days A FACE TO FACE EVALUATION WAS PERFORMED  Nicholas [name] 6/25 02/15/2020, 9:28 AM

## 2020-02-15 NOTE — Progress Notes (Signed)
Patient ID: Nicholas [name], male   DOB: 1951-08-02, 69 y.o.   MRN: [id number]  Sw followed up with family/patient for any questions/concerns we transition into weekend, no concerns

## 2020-02-15 NOTE — Progress Notes (Signed)
Occupational Therapy Session Note  Patient Details  Name: Nicholas [name] MRN: [id number] Date of Birth: Nov 20, 1950  Today's Date: 02/15/2020 OT Individual Time: 1015-1120 OT Individual Time Calculation (min): 65 min    Short Term Goals: Week 1:  OT Short Term Goal 1 (Week 1): Pt will maintain static sitting balance (unsupported) with min A in prep for dyanamic sititng balance in ADL with OT Short Term Goal 1 - Progress (Week 1): Progressing toward goal OT Short Term Goal 2 (Week 1): Pt will don shirt with mod A OT Short Term Goal 2 - Progress (Week 1): Progressing toward goal OT Short Term Goal 3 (Week 1): Pt will perform transfer of max A +1 with LRAD method OT Short Term Goal 3 - Progress (Week 1): Progressing toward goal OT Short Term Goal 4 (Week 1): PT will perform bed mobility in prep for ADL tasks with max A+1 (rolling/ supine to sit) OT Short Term Goal 4 - Progress (Week 1): Met Week 2:  OT Short Term Goal 1 (Week 2): Pt will maintain static sitting balance (unsupported) with min A in prep for dyanamic sititng balance in ADL OT Short Term Goal 2 (Week 2): Pt will don shirt with mod assist. OT Short Term Goal 3 (Week 2): Pt will perform sliding board transfers with max assist +1. OT Short Term Goal 4 (Week 2): Pt will wash UB with mod assist using BUE.  Skilled Therapeutic Interventions/Progress Updates:    Pt received in bed ready for therapy, wife in room and stated he had just been changed. ADL Retraining: From bed level in supine, worked with pt on crossing legs in a figure 4 pattern to have pt don shorts over feet. Pt pulled over feet with mod A to guide him but he did attend to task. Used bridging with mod A to facilitate a full hip lift for pt to pull shorts over hips with mod A to guide hands but pt did initiate trying to pull his shorts up.  From w/c, doffed shirt and donned new tshirt with mod A and mod cues. Improved attention to task.    At end of session, wife helped pt  with brushing teeth and hair.  Transfers: Used sliding board to move from bed to w/c.  From bed, had pt bring knees into flexion and then rolling onto R side with mod A.  With knees pulled in, pt assisted to sit with mod-max A.  Held static balance with mod-max A.  Once slide board positioned by 2nd helper, pt did use hands slightly to help slide into chair but he truly needed max A of 2 people.  He was not able to slide enough with 1 person assist safely.    Pt taken to gym for NMR of LUE and postural control.  Balance: In w/c, pt frequently falling to his left as he was frequently pushing to his L with R arm.   Increased height of L arm rest, placed 2 rolled blankets on L side to A with positioning.    Neuromuscular Re-Education: -Using mirror for feed back, pt worked on positioning with mod A to correct his alignment and finding his midline position.  Pt could hold this for short periods of time without distraction, with distraction he would lose his positioning quickly.   -using zoom ball and dowel bar ball bounce activity to facilitate LUE AROM and attention to L side, pt needed min-mod A physical guidance initially and then he did actively  use his L arm 50-60% of the time.  Overall, pt had improved participation this session, keeping both eyes open and attending fairly well to tasks.  His main obstacle continues to be his postural control and pushing to his L side.   Discussed with wife the therapy goals and she expressed her strong desire for him to be discharged home not to a SNF.  Discussed the potential scenarios with her and that therapy team does understand that desire and are working hard to improve his functional outcomes.  Pt in room with spouse.  Educated her on how to tilt the tilt in space wc and how to lock and unlock brakes as she wanted to help him with grooming at the sink. Belt alarm on.    Therapy Documentation Precautions:  Precautions Precautions: None Precaution  Comments: Lt hemiparesis with Lt neglect ; watch BP Restrictions Weight Bearing Restrictions: No Other Position/Activity Restrictions: keep BP >160   Pain: Pain Assessment Pain Score: 0-No pain ADL: ADL Eating: Maximal assistance Grooming: Minimal assistance Where Assessed-Grooming: Sitting at sink Upper Body Bathing: Maximal cueing, Moderate assistance Where Assessed-Upper Body Bathing: Shower Lower Body Bathing: Maximal assistance Where Assessed-Lower Body Bathing: Shower Upper Body Dressing: Moderate assistance Where Assessed-Upper Body Dressing: Wheelchair Lower Body Dressing: Maximal assistance Where Assessed-Lower Body Dressing: Bed level Toileting: Not assessed Toilet Transfer: Not assessed Gaffer Transfer: Not assessed Social research officer, government Method: Other (comment)(roll in shower chair) Youth worker: Other (comment)  Therapy/Group: Individual Therapy  [place] 02/15/2020, 11:35 AM

## 2020-02-15 NOTE — Progress Notes (Signed)
Physical Therapy Session Note  Patient Details  Name: Nicholas [name] MRN: [id number] Date of Birth: [birth year]  Today's Date: 02/15/2020 PT Individual Time: 5320-2334 PT Individual Time Calculation (min): 68 min   Short Term Goals: Week 2:  PT Short Term Goal 1 (Week 2): Patient to be able to roll both ways with no more than ModAx2 and use of bed features PT Short Term Goal 2 (Week 2): patient to be able to get to/from EOB with no more than ModAx2 and use of bed features PT Short Term Goal 3 (Week 2): Patient to be able to transfer from bed to chair with no more than ModAx2 and least restrictive technqiue PT Short Term Goal 4 (Week 2): Patient to tolerate standing for at least 2 minutes in standing frame  Skilled Therapeutic Interventions/Progress Updates:    Patient received in bed, wife Nicholas [name] present and reports that he has been sitting on the bedpan for about an hour trying to urinate/have BM- checked bedpan but he hadn't produced anything. Worked on rolling side to side in bed with Mod cues and MaxA for managing L LE, with Mod cues to technique and sequencing he was able to eventually roll to both sides with MinA/light ModA and use of railing today! Also worked on activation of musculature in L LE- actually able to get active participation in partial bridges as well as partial ankle pumps (gravity eliminated) and slight activation of musculature for bent knee marches L LE. Continued to require 2 people for safety (MaxAx2) for bed mobility and sliding board transfers, then went to standing frame and tolerated standing for approximately 3-4 minutes with Mod cues and visual feedback for hip extension and full upright posture today! Also got smaller TIS WC more appropriate for his size. Able to perform squat pivot transfer to new TIS chair with maxAx2 today as well. Left up right in TIS chair with all needs met, seatbelt alarm active and son Nicholas [name] present.   Therapy Documentation Precautions:   Precautions Precautions: None Precaution Comments: Lt hemiparesis with Lt neglect ; watch BP Restrictions Weight Bearing Restrictions: No Other Position/Activity Restrictions: keep BP >160 Pain: Pain Assessment Pain Scale: Faces Pain Score: 0-No pain Faces Pain Scale: Hurts a little bit Pain Type: Acute pain Pain Location: Leg Pain Orientation: Left Pain Descriptors / Indicators: Discomfort Pain Onset: On-going Patients Stated Pain Goal: 0 Pain Intervention(s): Medication (See eMAR) Multiple Pain Sites: No    Therapy/Group: Individual Therapy   Windell [place], DPT, PN1   Supplemental Physical Therapist [place]    Pager [phone] Acute Rehab Office [phone]    02/15/2020, 3:45 PM

## 2020-02-15 NOTE — Progress Notes (Signed)
Speech Language Pathology Daily Session Note  Patient Details  Name: Nicholas [name] MRN: [id number] Date of Birth: 09/11/1951  Today's Date: 02/15/2020 SLP Individual Time: 1130-1200 SLP Individual Time Calculation (min): 30 min  Short Term Goals: Week 2: SLP Short Term Goal 1 (Week 2): Pt will sustain attention to functional tasks for 7 minute intervals with Max A cues. SLP Short Term Goal 2 (Week 2): Pt will recall new and daily infor mation with Max A for use of aids or strategies. SLP Short Term Goal 3 (Week 2): Pt will scan left visual field in 7/10 opportunities provided Mod A multimodal cues. SLP Short Term Goal 4 (Week 2): Patient will follow 2 step directions with 80% accuracy and max A. SLP Short Term Goal 5 (Week 2): Pt will demonstrate functional problem solving skills in basic familiar situations with Mod A multimodal cues.  Skilled Therapeutic Interventions: Pt was seen for skilled ST targeting cognition. Pt's wife present throughout session. Pt able to maintain arousal appropriately today, although Max A verbal cues required for sustained attention to tasks in 1-3 minute intervals. Processing remains very slow, however he does benefit from extra time for responses and slower rate of speech when giving directions. Verbal initiation much improved today; he responded to 90% of questions throughout session and expressed wants and needs with Mod A verbal cues. Pt recalled deck of cards from yesterday's ST session, however unable to recall rules of task/what was targeted. Pt required Min A for problem solving, Max A for immediate recall and Mod A for error awareness in identifying the higher value card from field of 2. Total A required to count from 1-10 later in session (counting his stack of cards). Pt stated, "I just can't think" and inquired about interventions to help with back pain and mobility. SLP provided encouragement to continue to participate in therapies in order to maximize  progress, which may be slower than desired. Pt left sitting in chair with alarm set and needs within reach, wife still present. Continue per current plan of care.        Pain Pain Assessment Pain Scale: Faces Faces Pain Scale: Hurts a Nicholas bit Pain Type: Acute pain Pain Location: Back Pain Orientation: Right;Left Pain Descriptors / Indicators: Discomfort Pain Onset: Gradual(with sitting) Multiple Pain Sites: No  Therapy/Group: Individual Therapy  Nicholas [name] 02/15/2020, 7:16 AM

## 2020-02-16 ENCOUNTER — Inpatient Hospital Stay (HOSPITAL_COMMUNITY): Payer: BC Managed Care – PPO | Admitting: Physical Therapy

## 2020-02-16 ENCOUNTER — Inpatient Hospital Stay (HOSPITAL_COMMUNITY): Payer: BC Managed Care – PPO

## 2020-02-16 LAB — GLUCOSE, CAPILLARY
Glucose-Capillary: 62 mg/dL — ABNORMAL LOW (ref 70–99)
Glucose-Capillary: 69 mg/dL — ABNORMAL LOW (ref 70–99)
Glucose-Capillary: 89 mg/dL (ref 70–99)
Glucose-Capillary: 151 mg/dL — ABNORMAL HIGH (ref 70–99)
Glucose-Capillary: 187 mg/dL — ABNORMAL HIGH (ref 70–99)
Glucose-Capillary: 201 mg/dL — ABNORMAL HIGH (ref 70–99)

## 2020-02-16 LAB — CREATININE, SERUM
Creatinine, Ser: 0.67 mg/dL (ref 0.61–1.24)
GFR calc Af Amer: >60 mL/min (ref >60)
GFR calc non Af Amer: >60 mL/min (ref >60)

## 2020-02-16 MED ORDER — MUSCLE RUB 10-15 % EX CREA
TOPICAL_CREAM | Freq: Two times a day (BID) | CUTANEOUS | Status: DC
  Administered 2020-02-18 – 2020-03-04 (×2): 1 via TOPICAL
  Filled 2020-02-16: qty 85

## 2020-02-16 MED ORDER — AMANTADINE HCL 50 MG/5ML PO SYRP
100.0000 mg | ORAL_SOLUTION | Freq: Two times a day (BID) | ORAL | Status: DC
  Administered 2020-02-16 – 2020-02-27 (×21): 100 mg via ORAL
  Filled 2020-02-16 (×23): qty 10

## 2020-02-16 NOTE — Progress Notes (Signed)
Occupational Therapy Session Note  Patient Details  Name: Nicholas [name] MRN: [id number] Date of Birth: 16-Sep-1951  Today's Date: 02/16/2020 OT Individual Time: 1100-1153 OT Individual Time Calculation (min): 53 min    Short Term Goals: Week 2:  OT Short Term Goal 1 (Week 2): Pt will maintain static sitting balance (unsupported) with min A in prep for dyanamic sititng balance in ADL OT Short Term Goal 2 (Week 2): Pt will don shirt with mod assist. OT Short Term Goal 3 (Week 2): Pt will perform sliding board transfers with max assist +1. OT Short Term Goal 4 (Week 2): Pt will wash UB with mod assist using BUE.  Skilled Therapeutic Interventions/Progress Updates:  Patient met seated in TIS wc with son present at bedside in agreement with OT treatment session with focus on functional transfers, NMR and normalized movement patterns as detailed below. Patient reporting discomfort "all over" with request for return to bed. Squat-pivot transfer with +2 assist and cueing for hand placement, sequencing, and head/hip relationship. Lateral scoot toward [facility] and return to supine with +2 assist for trunk and to assist BLE from EOB to bed surface. NMR with patient lying supine in bed with focus on AAROM in LUE using red boomwhacker. Cervical stretching in frontal plane to facilitate soft tissue elongation and decrease discomfort. Session concluded with patient lying supine in bed with call bell within reach and all needs met.   Therapy Documentation Precautions:  Precautions Precautions: None Precaution Comments: Lt hemiparesis with Lt neglect ; watch BP Restrictions Weight Bearing Restrictions: No Other Position/Activity Restrictions: keep BP >160 General:    Therapy/Group: Individual Therapy  Sharee Sturdy R [place] 02/16/2020, 6:53 AM

## 2020-02-16 NOTE — Progress Notes (Signed)
Physical Therapy Session Note  Patient Details  Name: Nicholas [name] MRN: [id number] Date of Birth: 04/15/1951  Today's Date: 02/16/2020 PT Individual Time: 0806-0905 PT Individual Time Calculation (min): 59 min   Short Term Goals: Week 2:  PT Short Term Goal 1 (Week 2): Patient to be able to roll both ways with no more than ModAx2 and use of bed features PT Short Term Goal 2 (Week 2): patient to be able to get to/from EOB with no more than ModAx2 and use of bed features PT Short Term Goal 3 (Week 2): Patient to be able to transfer from bed to chair with no more than ModAx2 and least restrictive technqiue PT Short Term Goal 4 (Week 2): Patient to tolerate standing for at least 2 minutes in standing frame  Skilled Therapeutic Interventions/Progress Updates:    Pt received supine in bed with Santiago Glad, RN present for morning medication administration. Pt agreeable to therapy session - continues to have delayed processing and delayed response to questions but is able to respond with a few words. Pt noted to be incontinent of bowels. Rolling R/L in bed with mod assist of 1 for L LE management and rolling trunk/pelvis while pt using bed railings to assist - max multimodal cuing for sequencing and increased pt participation in task. Therapist performed total assist LB clothing management and peri-care - required increased time for cleanliness due to large BM. In supine, donned pants with max assist for threading LEs - therapist manually facilitated L LE in hooklying position with pt able to clear hips slightly from bed for +2 assist to pull shorts up over hips. Supine>sitting R EOB via logroll technique to increase pt independence with max/total assist of 1 for trunk upright (+2 present for safety)- max multimodal cuing for sequencing and pt to initiate moving B LEs off EOB and use R UE to push trunk upright. Once upright provided cuing for midline orientation due to posterior lean and once in midline pt able to  maintain static sitting with CGA for steadying - improved sitting balance EOB compared to on mat due to mattress providing pelvic support. Sitting EOB with min/mod assist for trunk control while 2nd person donned shirt mod assist and shoes max assist. R lateral scoot transfer EOB>TIS w/c using transfer board with +2 assist for trunk control while placing board and able to perform R scooting with max assist and max multimodal cuing for sequencing, increased anterior trunk lean, and pulling with R arm to scoot hips.  Transported to/from gym in w/c for time management and energy conservation. L lateral scoot transfer w/c>EOM using transfer slide board with +2 assist for trunk control while placing board and able to scoot hips with max assist when given increased time and max multimodal cuing for sequencing and pushing with R arm to scoot hips as well as increased anterior trunk lean. Sitting EOM participated in sitting balance task with mirror feedback for midline orientation - pt with increased difficulty maintaining sitting balance on mat with consistent posterior LOB requiring total assist to prevent falling backwards but once caught can come back to midline with min/mod assist and max cuing. Progressed to anterior and R lateral reaching task to promote increased anterior trunk lean then L attention to place object on his L side - requires same assist as described above. Pt reports fatigue. Sit>supine on mat with +2 max assist for trunk descent and B LE management. Performed supine bridging x10 reps with cuing and manual facilitation for increased  glute muscle activation and hip extension ROM. Supine>sitting R EOM via logroll with total assist of 1 for trunk upright, continued cuing for increased pt participation in BLE management off EOM. R lateral scoot transfer back to TIS w/c as described above. Transported back to room and left seated in TIS w/c in the care of his son. Therapist reinforced education to son  regarding tilting w/c back to perform pressure relief for 1 minute every 30 minutes as well as continuing to engage him in conversations on his L side with L head turn.   Therapy Documentation Precautions:  Precautions Precautions: None Precaution Comments: Lt hemiparesis with Lt neglect ; watch BP Restrictions Weight Bearing Restrictions: No Other Position/Activity Restrictions: keep BP >160  Pain:   Reports back pain during session - especially when initially rolling in the bed to start moving this morning - provided repositioning, rest breaks, and distraction for pain management.   Therapy/Group: Individual Therapy  Ginny Forth, PT, DPT 02/16/2020, 7:53 AM

## 2020-02-16 NOTE — Progress Notes (Signed)
[facility] PHYSICAL MEDICINE & REHABILITATION PROGRESS NOTE   Subjective/Complaints:  Patient seen while lying in bed.  Son is at bedside.  Very sparse verbal output but does admit to low back pain.  He could not tell me when it started.  Cannot give any exacerbating or alleviating factors Son has noted cognition has improved  over the last 3 days ROS: Denies neck pain, CP, SOB, N/V/D, some limitation secondary to cognition  Objective:   No results found. No results for input(s): WBC, HGB, HCT, PLT in the last 72 hours. Recent Labs    02/15/20 0655 02/16/20 0733  NA 139  --   K 3.6  --   CL 102  --   CO2 26  --   GLUCOSE 85  --   BUN 22  --   CREATININE 0.68 0.67  CALCIUM 9.2  --     Intake/Output Summary (Last 24 hours) at 02/16/2020 1241 Last data filed at 02/16/2020 0751 Gross per 24 hour  Intake 760 ml  Output --  Net 760 ml     Physical Exam: Vital Signs Blood pressure (!) 142/80, pulse 77, temperature 98.1 F (36.7 C), resp. rate 16, height 6' (1.829 m), weight 89.8 kg, SpO2 97 %.  General: No acute distress Mood and affect are appropriate Heart: Regular rate and rhythm no rubs murmurs or extra sounds Lungs: Clear to auscultation, breathing unlabored, no rales or wheezes Abdomen: Positive bowel sounds, soft nontender to palpation, nondistended Extremities: No clubbing, cyanosis, or edema Back no tenderness to palpation.  There was perhaps some grimacing according to the son when palpating the gluteus medius area on the right side could not get to the left side gluteus medius secondary to the left lateral decubitus position  Motor: LUE: 4 -/5 proximal distal with apraxia LLE: 1+-2-/5 proximal distal Difficult to assess tone due to patient resistance  Assessment/Plan: 1. Functional deficits secondary to [facility] which require 3+ hours per day of interdisciplinary therapy in a comprehensive inpatient rehab setting.  Physiatrist is providing close team supervision  and 24 hour management of active medical problems listed below.  Physiatrist and rehab team continue to assess barriers to discharge/monitor patient progress toward functional and medical goals  Care Tool:  Bathing    Body parts bathed by patient: Abdomen, Front perineal area, Left upper leg, Face   Body parts bathed by helper: Right arm, Left arm, Chest, Buttocks, Right lower leg, Left lower leg     Bathing assist Assist Level: Total Assistance - Patient < 25%     Upper Body Dressing/Undressing Upper body dressing   What is the patient wearing?: Pull over shirt    Upper body assist Assist Level: Moderate Assistance - Patient 50 - 74%    Lower Body Dressing/Undressing Lower body dressing      What is the patient wearing?: Pants     Lower body assist Assist for lower body dressing: Maximal Assistance - Patient 25 - 49%     Toileting Toileting    Toileting assist Assist for toileting: Dependent - Patient 0%     Transfers Chair/bed transfer  Transfers assist     Chair/bed transfer assist level: 2 Helpers(slide board)     Locomotion Ambulation   Ambulation assist   Ambulation activity did not occur: Safety/medical concerns          Walk 10 feet activity   Assist  Walk 10 feet activity did not occur: Safety/medical concerns  Walk 50 feet activity   Assist Walk 50 feet with 2 turns activity did not occur: Safety/medical concerns         Walk 150 feet activity   Assist Walk 150 feet activity did not occur: Safety/medical concerns         Walk 10 feet on uneven surface  activity   Assist Walk 10 feet on uneven surfaces activity did not occur: Safety/medical concerns         Wheelchair     Assist Will patient use wheelchair at discharge?: Yes Type of Wheelchair: Manual    Wheelchair assist level: Dependent - Patient 0% Max wheelchair distance: 150 ft    Wheelchair 50 feet with 2 turns activity    Assist         Assist Level: Dependent - Patient 0%   Wheelchair 150 feet activity     Assist      Assist Level: Dependent - Patient 0%    Medical Problem List and Plan: 1.  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  Status post cerebral angiogram balloon angioplasty  Continue CIR 15/17  Bilateral PRAFO 2.  Antithrombotics: -DVT/anticoagulation: Lovenox initiated 01/29/2020             -antiplatelet therapy: Aspirin 325 mg daily 3. Pain Management: Oxycodone as needed, Robaxin as needed. Heating pad to relax tight paraspinals of cervical and lumbar spine  Changed Oxycodone to Norco 7.5 mg/325 mg q4 hours per son request since been so sedated.  Muscle rub ordered on 5/27, has not received, will schedule may be used for neck low back and buttock area  Appears to be controlled on 5/28 4. Mood: Provide emotional support             -antipsychotic agents: N/A  Zoloft daily.   Amantadine 100 with breakfast and lunch started on 5/26 after discussion with therapies-will monitor for efficacy  Appears to have some improvement, discussed with therapies. 5. Neuropsych: This patient is not capable of making decisions on his own behalf. 6. Skin/Wound Care: Routine skin checks 7. Fluids/Electrolytes/Nutrition: Routine in and outs   Carb modified diet- p.o. intake improved. 8.  Diabetes mellitus with hyperglycemia.  Hemoglobin A1c 7.7.  Check blood sugars before meals and at bedtime.  Patient on Tresiba 35 units daily, Glucotrol 10 mg daily, Actos 45 mg daily, Janumet 50-1000 mg twice daily, NovoLog 75 units daily prior to admission.               Levemir 31U BID, decreased to 28 twice daily on 5/26, decreased to 25 BID on 5/28  NovoLog 3 units 3 times daily started on 5/26, increased to 5 TID on 5/28   CBG (last 3)  Recent Labs    02/16/20 0704 02/16/20 0754 02/16/20 1156  GLUCAP 69* 89 151*   9.  Seizure prophylaxis. Keppra 500 mg twice daily, decreased to 250 twice  daily on 5/25, d/ced on 5/28 10.  Arrhythmia/wide-complex tachycardia.    Amiodarone 200 mg daily x4 weeks (~6/12).  Follow-up cardiology services 11.  Hypertension with orthostasis.  Autoregulate on Nimotop.  Elevated at rest, however orthostasis noted by therapies, will avoid overcorrection   TEDS and abdominal binder as needed   Relatively controlled on 5/28 12.  Hyperlipidemia.  Crestor/fenofibrate 13. Mild vasospasm evident on 5/14 vascular transcranial doppler. 14. Spasticity:  Started baclofen 5mg  TID for pain relief and spasticity, changed to nightly due to ?lethargy.  Will consider d/cing in future  if appropriate 15.  Hyponatremia: Resolved  Sodium 139 on 5/28  Continue to monitor 16. Transaminitis: Resolved  LFTs WNL on 5/28  Continue to monitor 17.  Acute blood loss anemia  Hemoglobin 11.6 on 5/25  Continue to monitor  LOS: 14 days A FACE TO FACE EVALUATION WAS PERFORMED  Erick Colace 02/16/2020, 12:41 PM

## 2020-02-17 ENCOUNTER — Inpatient Hospital Stay (HOSPITAL_COMMUNITY): Payer: BC Managed Care – PPO

## 2020-02-17 ENCOUNTER — Inpatient Hospital Stay (HOSPITAL_COMMUNITY): Payer: BC Managed Care – PPO | Admitting: Occupational Therapy

## 2020-02-17 LAB — GLUCOSE, CAPILLARY
Glucose-Capillary: 115 mg/dL — ABNORMAL HIGH (ref 70–99)
Glucose-Capillary: 115 mg/dL — ABNORMAL HIGH (ref 70–99)
Glucose-Capillary: 126 mg/dL — ABNORMAL HIGH (ref 70–99)
Glucose-Capillary: 193 mg/dL — ABNORMAL HIGH (ref 70–99)

## 2020-02-17 NOTE — Progress Notes (Signed)
Pt called multiple times and attempted to get out of bed once. When questioned why pt wanted to leave his bed, pt stated 'I need to help my wife with care' and 'I need to take my kids to the spaceship.' Pt was reoriented and it was explained to him that he was currently at [facility] and that his spouse will see him during the day. Pt was readjusted back into bed, and encouraged to get more sleep. Pt had his scheduled meds and PRN robaxin @2024  for muscle spasms.

## 2020-02-17 NOTE — Progress Notes (Signed)
Occupational Therapy Session Note  Patient Details  Name: Nicholas [name] MRN: [id number] Date of Birth: Apr 27, 1951  Today's Date: 02/17/2020 OT Individual Time: 0900-1014 OT Individual Time Calculation (min): 74 min   Short Term Goals: Week 2:  OT Short Term Goal 1 (Week 2): Pt will maintain static sitting balance (unsupported) with min A in prep for dyanamic sititng balance in ADL OT Short Term Goal 2 (Week 2): Pt will don shirt with mod assist. OT Short Term Goal 3 (Week 2): Pt will perform sliding board transfers with max assist +1. OT Short Term Goal 4 (Week 2): Pt will wash UB with mod assist using BUE.     Skilled Therapeutic Interventions/Progress Updates:    Pt greeted in bed with RN present. Per RN, pt was just medicated for pain. Pt unable to tell OT if his brief was clean or not, found to be incontinent of bladder. Pt able to roll Rt>Lt with Mod A, increased time to process and motor plan and HOH at times for guiding arms towards bedrails to assist with rolling. He required Total A for pericare and brief change. Worked on motor planning and bilateral hand use for a bit after when applying lotion to upper legs.Then transitioned to doffing gripper socks with pt able to do on the Rt side with cuing alone. He needed assistance to bring Lt LE closer to torso, with pt doffing sock with Mod A. Noted discomfort/pain with we tried to place Lt LE into figure 4 or modified figure 4 positions. Pt assisted with threading Rt LE into pants with increased time and cuing, also able to complete small bridges with +2 assist during dressing tasks, 1 helper stabilizing Lt LE and 2nd helper pulling up pants. Then pt rolled Rt>Lt with Mod A to fully elevate pants. Total A of 2 for supine<sit with bed DEFLATED, pt with heavy posterior lean. Note that initially tried supine<sit with bed inflated at max firmness with pts hips slipping off of bed. Heavy +2 assist for sit<stand in Stedy from elevated bed. Heavy Lt  lean/push during transfer to [facility], once again pt needing +2 assistance. While sitting on BSC, worked on sitting balance, with vcs for increasing visual attendance to mirror. The Stedy bars helped pt with improving forward weight shift but pt still exhibited heavy Lt lean/push, needing manual cuing to offset. He was ultimately unable to void. +2 for clothing mgt and transfer back to TIS using Stedy. While sitting upright at the sink, pt engaged in oral care. He initiated bilateral inclusion of the Lt hand however needed Max A for motor planning. Note that pt with a lot of perseveration while brushing teeth. Anterior Lt LOBs when leaning forward to spit in the sink, needing assistance to correct/offset. At end of session pt was reclined for safety and comfort in TIS, left him with safety belt, w/c belt, and lateral props for the Lt side to improve upright trunk. Call bell and soft call bell in lap on his Rt side.   Therapy Documentation Precautions:  Precautions Precautions: None Precaution Comments: Lt hemiparesis with Lt neglect ; watch BP Restrictions Weight Bearing Restrictions: No Other Position/Activity Restrictions: keep BP >160 Pain: Pain Assessment Pain Scale: 0-10(premedicated) Pain Score: Asleep Pain Type: Acute pain Pain Location: Back Pain Orientation: Lower Pain Descriptors / Indicators: Aching;Discomfort Pain Frequency: Occasional Pain Onset: With Activity Pain Intervention(s): Medication (See eMAR) ADL: ADL Eating: Maximal assistance Grooming: Minimal assistance Where Assessed-Grooming: Sitting at sink Upper Body Bathing: Maximal cueing,  Moderate assistance Where Assessed-Upper Body Bathing: Shower Lower Body Bathing: Maximal assistance Where Assessed-Lower Body Bathing: Shower Upper Body Dressing: Moderate assistance Where Assessed-Upper Body Dressing: Wheelchair Lower Body Dressing: Maximal assistance Where Assessed-Lower Body Dressing: Bed level Toileting: Not  assessed Toilet Transfer: Not assessed Gaffer Transfer: Not assessed Social research officer, government Method: Other (comment)(roll in shower chair) Youth worker: Other (comment)     Therapy/Group: Individual Therapy  Oona Trammel A Domani Bakos 02/17/2020, 12:22 PM

## 2020-02-17 NOTE — Progress Notes (Signed)
Physical Therapy Session Note  Patient Details  Name: Nicholas [name] MRN: [id number] Date of Birth: 1950/10/18  Today's Date: 02/17/2020 PT Individual Time: 1300-1405 PT Individual Time Calculation (min): 65 min   Short Term Goals:  Week 2:  PT Short Term Goal 1 (Week 2): Patient to be able to roll both ways with no more than ModAx2 and use of bed features PT Short Term Goal 2 (Week 2): patient to be able to get to/from EOB with no more than ModAx2 and use of bed features PT Short Term Goal 3 (Week 2): Patient to be able to transfer from bed to chair with no more than ModAx2 and least restrictive technqiue PT Short Term Goal 4 (Week 2): Patient to tolerate standing for at least 2 minutes in standing frame  Skilled Therapeutic Interventions/Progress Updates:   Pt resting in tilt in space wc; c/o pain 5/10 in low back.  PT adjusted bil legrests and headrest, and added hip positioners to decrease bil hip external rotation/sacral sitting.  Pt stated that these changes felt better.  neuromuscular re-education via multimodal cues for trunk activation to facilitate symmetrical sitting in w/c; seated R active/L active -assistive long arc quad knee extensions.  In sitting upright in w/c, forced use and multimodal cues (active assistance for LLE:  use of Kinetron at 50 cm/sec for alternating reciprocal LEs movements.  With w/c offset to R of Kinetron- use of LLE only with assistance.   Therapeutic activities in sitting in w/c: using bil hands on 1# weighted bar for volleying beach ball x 10, x 20 with improving attention to target; using bil hands on 2# weighted bar x 20.  Pt needed cue prior to each volley to place bar in "starting position" near his chest. He did not need assistance to keep L hand on bar  Slide board transfer to return to bed, to R, sligthly downhill.  Pt needed mod/max assist of 1 person, and additional person to stabilize board.  +2 for sit> supine.  At end of session, pt resting in  bed with needs at hand, bed alarm set.     Therapy Documentation Precautions:  Precautions Precautions: None Precaution Comments: Lt hemiparesis with Lt neglect ; watch BP Restrictions Weight Bearing Restrictions: No Other Position/Activity Restrictions: keep BP >160        Therapy/Group: Individual Therapy  Velvia Mehrer 02/17/2020, 3:19 PM

## 2020-02-18 ENCOUNTER — Inpatient Hospital Stay (HOSPITAL_COMMUNITY): Payer: BC Managed Care – PPO | Admitting: Occupational Therapy

## 2020-02-18 ENCOUNTER — Inpatient Hospital Stay (HOSPITAL_COMMUNITY): Payer: BC Managed Care – PPO | Admitting: Physical Therapy

## 2020-02-18 LAB — GLUCOSE, CAPILLARY
Glucose-Capillary: 91 mg/dL (ref 70–99)
Glucose-Capillary: 128 mg/dL — ABNORMAL HIGH (ref 70–99)
Glucose-Capillary: 89 mg/dL (ref 70–99)
Glucose-Capillary: 167 mg/dL — ABNORMAL HIGH (ref 70–99)

## 2020-02-18 MED ORDER — BISACODYL 10 MG RE SUPP: 10.0000 mg | Freq: Every day | RECTAL | Status: DC

## 2020-02-18 NOTE — Progress Notes (Signed)
[facility] PHYSICAL MEDICINE & REHABILITATION PROGRESS NOTE   Subjective/Complaints: Patient seen laying in bed this morning.  He states he slept well overnight.  He states he had a good weekend.  Response time improving.  ROS: Denies neck pain, CP, SOB, N/V/D  Objective:   No results found. No results for input(s): WBC, HGB, HCT, PLT in the last 72 hours. Recent Labs    02/16/20 0733  CREATININE 0.67    Intake/Output Summary (Last 24 hours) at 02/18/2020 0830 Last data filed at 02/17/2020 1811 Gross per 24 hour  Intake 250 ml  Output --  Net 250 ml     Physical Exam: Vital Signs Blood pressure (!) 154/84, pulse 79, temperature 98.6 F (37 C), temperature source Oral, resp. rate 17, height 6' (1.829 m), weight 89.8 kg, SpO2 100 %. Constitutional: No distress . Vital signs reviewed. HENT: Normocephalic.  Atraumatic. Eyes: EOMI. No discharge. Cardiovascular: No JVD. Respiratory: Normal effort.  No stridor. GI: Non-distended. Skin: Warm and dry.  Intact. Psych: Normal mood.  Normal behavior. Musc: No edema in extremities.  No tenderness in extremities. Neuro: Alert Motor: Motor: LUE: 4 -/5 proximal distal with apraxia LLE: 1+-2-/5 proximal distal, unchanged Tone difficult to assess due to patient resistance  Assessment/Plan: 1. Functional deficits secondary to [facility] which require 3+ hours per day of interdisciplinary therapy in a comprehensive inpatient rehab setting.  Physiatrist is providing close team supervision and 24 hour management of active medical problems listed below.  Physiatrist and rehab team continue to assess barriers to discharge/monitor patient progress toward functional and medical goals  Care Tool:  Bathing    Body parts bathed by patient: Abdomen, Front perineal area, Left upper leg, Face   Body parts bathed by helper: Right arm, Left arm, Chest, Buttocks, Right lower leg, Left lower leg     Bathing assist Assist Level: Total Assistance -  Patient < 25%     Upper Body Dressing/Undressing Upper body dressing   What is the patient wearing?: Pull over shirt    Upper body assist Assist Level: Moderate Assistance - Patient 50 - 74%    Lower Body Dressing/Undressing Lower body dressing      What is the patient wearing?: Pants     Lower body assist Assist for lower body dressing: Maximal Assistance - Patient 25 - 49%     Toileting Toileting    Toileting assist Assist for toileting: 2 Helpers(using Stedy + BSC)     Transfers Chair/bed transfer  Transfers assist     Chair/bed transfer assist level: 2 Helpers     Locomotion Ambulation   Ambulation assist   Ambulation activity did not occur: Safety/medical concerns          Walk 10 feet activity   Assist  Walk 10 feet activity did not occur: Safety/medical concerns        Walk 50 feet activity   Assist Walk 50 feet with 2 turns activity did not occur: Safety/medical concerns         Walk 150 feet activity   Assist Walk 150 feet activity did not occur: Safety/medical concerns         Walk 10 feet on uneven surface  activity   Assist Walk 10 feet on uneven surfaces activity did not occur: Safety/medical concerns         Wheelchair     Assist Will patient use wheelchair at discharge?: Yes Type of Wheelchair: Manual    Wheelchair assist level: Dependent - Patient  0% Max wheelchair distance: 150 ft    Wheelchair 50 feet with 2 turns activity    Assist        Assist Level: Dependent - Patient 0%   Wheelchair 150 feet activity     Assist      Assist Level: Dependent - Patient 0%    Medical Problem List and Plan: 1.  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  Status post cerebral angiogram balloon angioplasty  Continue CIR 15/17  Bilateral PRAFO 2.  Antithrombotics: -DVT/anticoagulation: Lovenox initiated 01/29/2020             -antiplatelet therapy: Aspirin 325 mg  daily 3. Pain Management: Oxycodone as needed, Robaxin as needed. Heating pad to relax tight paraspinals of cervical and lumbar spine  Changed Oxycodone to Norco 7.5 mg/325 mg q4 hours per son request since been so sedated.  Muscle rub ordered on 5/27, scheduled may be used for neck low back and buttock area  Appears to be controlled on 5/31 4. Mood: Provide emotional support             -antipsychotic agents: N/A  Zoloft daily.   Amantadine 100 with breakfast and lunch started on 5/26 after discussion with therapies, improved after discussion with therapies. 5. Neuropsych: This patient is not capable of making decisions on his own behalf. 6. Skin/Wound Care: Routine skin checks 7. Fluids/Electrolytes/Nutrition: Routine in and outs   Carb modified diet- p.o. intake improved. 8.  Diabetes mellitus with hyperglycemia.  Hemoglobin A1c 7.7.  Check blood sugars before meals and at bedtime.  Patient on Tresiba 35 units daily, Glucotrol 10 mg daily, Actos 45 mg daily, Janumet 50-1000 mg twice daily, NovoLog 75 units daily prior to admission.               Levemir 31U BID, decreased to 28 twice daily on 5/26, decreased to 25 BID on 5/28  NovoLog 3 units 3 times daily started on 5/26, increased to 5 TID on 5/28  Labile on 5/31, will consider further medication adjustments as necessary CBG (last 3)  Recent Labs    02/17/20 1645 02/17/20 2002 02/18/20 0627  GLUCAP 126* 193* 91   9.  Seizure prophylaxis. Keppra 500 mg twice daily, decreased to 250 twice daily on 5/25, d/ced on 5/28 10.  Arrhythmia/wide-complex tachycardia.   Amiodarone 200 mg daily x4 weeks (~6/12).  Follow-up cardiology services 11.  Hypertension with orthostasis.  Autoregulate on Nimotop.  Elevated at rest, however orthostasis noted by therapies, will avoid overcorrection   TEDS and abdominal binder as needed   Labile on 5/31 12.  Hyperlipidemia.  Crestor/fenofibrate 13. Mild vasospasm evident on 5/14 vascular transcranial  doppler. 14. Spasticity:  Started baclofen 5mg  TID for pain relief and spasticity, changed to nightly due to ?lethargy.  Will consider d/cing in future if appropriate 15.  Hyponatremia: Resolved  Sodium 139 on 5/28  Continue to monitor 16. Transaminitis: Resolved  LFTs WNL on 5/28  Continue to monitor 17.  Acute blood loss anemia  Hemoglobin 11.6 on 5/25  Continue to monitor  LOS: 16 days A FACE TO FACE EVALUATION WAS PERFORMED  Murphy Duzan Lorie Phenix 02/18/2020, 8:30 AM

## 2020-02-18 NOTE — Progress Notes (Signed)
Speech Language Pathology Weekly Progress and Session Note  Patient Details  Name: Nicholas [name] MRN: [id number] Date of Birth: [birth year]  Beginning of progress report period: Feb 11, 2020 End of progress report period: Feb 18, 2020  Today's Date: 02/18/2020 SLP Individual Time: 1130-1200 SLP Individual Time Calculation (min): 30 min  Short Term Goals: Week 2: SLP Short Term Goal 1 (Week 2): Pt will sustain attention to functional tasks for 7 minute intervals with Max A cues. SLP Short Term Goal 1 - Progress (Week 2): Progressing toward goal SLP Short Term Goal 2 (Week 2): Pt will recall new and daily infor mation with Max A for use of aids or strategies. SLP Short Term Goal 2 - Progress (Week 2): Met SLP Short Term Goal 3 (Week 2): Pt will scan left visual field in 7/10 opportunities provided Mod A multimodal cues. SLP Short Term Goal 3 - Progress (Week 2): Met SLP Short Term Goal 4 (Week 2): Patient will follow 2 step directions with 80% accuracy and max A. SLP Short Term Goal 4 - Progress (Week 2): Met SLP Short Term Goal 5 (Week 2): Pt will demonstrate functional problem solving skills in basic familiar situations with Mod A multimodal cues. SLP Short Term Goal 5 - Progress (Week 2): Progressing toward goal    New Short Term Goals: Week 3: SLP Short Term Goal 1 (Week 3): Pt will sustain attention to functional tasks for 5-7 minute intervals with Max A cues. SLP Short Term Goal 2 (Week 3): Pt will recall new and daily information with Mod A for use of aids or strategies. SLP Short Term Goal 3 (Week 3): Pt will engage in functional tasks with Mod A for use of strategies for visual scanning and/or locating items on left side of environment. SLP Short Term Goal 4 (Week 3): Pt will demonstrate functional problem solving skills in basic familiar situations with Mod A multimodal cues. SLP Short Term Goal 5 (Week 3): Patient will follow 2 step directions with 80% accuracy and Mod A.  Weekly  Progress Updates: Pt has made functional gains and met 3 out of 5 short term goals. Pt is currently Mod-Max assist for basic tasks due to cognitive fluctuations in level of arousal which can limit participation, in combination with cognitive impairments impacting his processing speech, basic problem solving, short term memory, and sustained attention to tasks. Pt also with significant left visual field impairments which can further impact his problem solving abilities and functional performance during tasks. Pt does not have dysphagia, but does require full supervision during meals due to cognition, as well as for assistance with self-feeding. Pt has demonstrated improved verbal and task initiation this week, and ability to follow multi-step commands. Pt and family education is ongoing with pt's son and wife. Pt would continue to benefit from skilled ST while inpatient in order to maximize functional independence and reduce burden of care prior to discharge. Anticipate that pt will need 24/7 supervision at discharge in addition to [place] follow up at next level of care.       Intensity: Minumum of 1-2 x/day, 30 to 90 minutes Frequency: 3 to 5 out of 7 days Duration/Length of Stay: 03/04/20 Treatment/Interventions: Cognitive remediation/compensation;Speech/Language facilitation;Cueing hierarchy;Functional tasks;Internal/external aids;Patient/family education;Therapeutic Activities;Environmental controls   Daily Session  Skilled Therapeutic Interventions: Pt was seen for skilled ST targeting cognition. Pt's son present for 90% of session. Pt was slightly disoriented to date, but used calendar to correct with Mod A for visual scanning.  SLP facilitated session with a familiar Quar[employer] manager task. Pt with recall that this task was "hard" for him in previously targeted session. Max faded to Mod A verbal and visual cues required for problem solving and error awareness in order for pt to reconstruct basic designs  consisting of 4 colors. When design complexity increased slightly, consistent Max A required. Pt sustained his attention with Moderate verbal cues for redirection. Pt also demonstrated ability to follow auditory 2-step basic directions with 100% accuracy and Min A verbal cues for recall. Pt left sitting in wheelchair with seatbelt alarm set and needs within reach. Continue per current plan of care.        Pain Pain Assessment Pain Scale: Faces Faces Pain Scale: No hurt  Therapy/Group: Individual Therapy  Arbutus Leas 02/18/2020, 11:12 AM

## 2020-02-18 NOTE — Progress Notes (Signed)
Physical Therapy Weekly Progress Note  Patient Details  Name: Nicholas [name] MRN: [id number] Date of Birth: 01/31/1951  Beginning of progress report period: Feb 11, 2020 End of progress report period: Feb 18, 2020  Today's Date: 02/18/2020 PT Individual Time: 5638-7564 PT Individual Time Calculation (min): 58 min   Patient has met 2 of 4 short term goals.  His alertness has begun improving since late last week, and he has been more participatory and able to participate with skilled PT services recently! Continues to be limited by poor attention to L side, slow processing and limited attention, and fatigues easily- thus progress has been slow but since becoming more alert last week, steady.Family has been very supportive and follows all therapy directions well and at times will assist in sessions.   Patient continues to demonstrate the following deficits muscle weakness, decreased cardiorespiratoy endurance, impaired timing and sequencing, unbalanced muscle activation, motor apraxia, ataxia, decreased coordination and decreased motor planning, decreased midline orientation, decreased attention to left and decreased motor planning, decreased initiation, decreased attention, decreased awareness, decreased problem solving, decreased safety awareness, decreased memory and delayed processing and decreased sitting balance, decreased standing balance, decreased postural control, hemiplegia and decreased balance strategies and therefore will continue to benefit from skilled PT intervention to increase functional independence with mobility.  Patient progressing toward long term goals..  Continue plan of care.  PT Short Term Goals Week 2:  PT Short Term Goal 1 (Week 2): Patient to be able to roll both ways with no more than ModAx2 and use of bed features PT Short Term Goal 1 - Progress (Week 2): Met PT Short Term Goal 2 (Week 2): patient to be able to get to/from EOB with no more than ModAx2 and use of bed  features PT Short Term Goal 2 - Progress (Week 2): Progressing toward goal PT Short Term Goal 3 (Week 2): Patient to be able to transfer from bed to chair with no more than ModAx2 and least restrictive technqiue PT Short Term Goal 3 - Progress (Week 2): Progressing toward goal PT Short Term Goal 4 (Week 2): Patient to tolerate standing for at least 2 minutes in standing frame PT Short Term Goal 4 - Progress (Week 2): Met Week 3:  PT Short Term Goal 1 (Week 3): Patient to be able to complete all supine<->sit transfers with maxAx1 and use of bed features PT Short Term Goal 2 (Week 3): Patient to be able to consistently perform sliding board transfers wtih Mod-MaxA of 1 and standby of second person for safety PT Short Term Goal 3 (Week 3): Patient to tolerate 3-4 minutes in standing frame PT Short Term Goal 4 (Week 3): Paitent to initiate gait training  Skilled Therapeutic Interventions/Progress Updates:    Patient received up in [facility], pleasant and interactive with PT and able to answer simple questions today. Continued with work in standing frame with visual feedback of mirror, able to maintain standing for `1-2 minutes today but with increased anterior and left lean requiring Mod-max cues to correct, just seemed to be more tired this afternoon. Followed this immediately with multiple stands in stedy for improved carryover- able to stand with MaxA of 2 helpers and continues to need cues to correct L and anterior lean in standing, fatigues easily but was more upright than in past sessions. Used sliding board with 2 helpers (mod-maxA of 2) due to fatigue this afternoon. Otherwise spent time selecting and preparing appropriate size Roho cushion and educated patient and family  on relevance of new cushion as well as connection to potentially being able to stay up in the chair longer. Otherwise worked on L LE muscle activation in supine- definitely able to get greater range of motion with L ankle pumps as well as  improved muscle activation in L LE in general, but still remains very weak. Noted increased ataxia in L UE>L LE today. Left in bed with all needs met, bed alarm active and family present this afternoon.   Therapy Documentation Precautions:  Precautions Precautions: None Precaution Comments: Lt hemiparesis with Lt neglect ; watch BP Restrictions Weight Bearing Restrictions: No Other Position/Activity Restrictions: keep BP >160 General:   Pain: Pain Assessment Pain Scale: Faces Pain Score: 2  Faces Pain Scale: Hurts little more Pain Type: Acute pain Pain Location: Back Pain Orientation: Lower Pain Descriptors / Indicators: Aching;Sore Pain Onset: On-going Patients Stated Pain Goal: 0 Pain Intervention(s): Repositioned;Ambulation/increased activity   Therapy/Group: Individual Therapy  Windell Norfolk, DPT, PN1   Supplemental Physical Therapist [place]    Pager [phone] Acute Rehab Office [phone]

## 2020-02-18 NOTE — Plan of Care (Signed)
  Problem: RH BOWEL ELIMINATION Goal: RH STG MANAGE BOWEL W/MEDICATION W/ASSISTANCE Description: STG Manage Bowel with Medication with  Mod Assistance. Outcome: Progressing   Problem: RH SKIN INTEGRITY Goal: RH STG SKIN FREE OF INFECTION/BREAKDOWN Description: Mod I Outcome: Progressing   Problem: RH SAFETY Goal: RH STG ADHERE TO SAFETY PRECAUTIONS W/ASSISTANCE/DEVICE Description: STG Adhere to Safety Precautions With Min Assistance/Device. Outcome: Progressing Goal: RH STG DECREASED RISK OF FALL WITH ASSISTANCE Description: STG Decreased Risk of Fall With [employer]. Outcome: Progressing   Problem: RH COGNITION-NURSING Goal: RH STG ANTICIPATES NEEDS/CALLS FOR ASSIST W/ASSIST/CUES Description: STG Anticipates Needs/Calls for Assist With Min  Assistance/Cues. Outcome: Progressing   Problem: RH Vision Goal: RH LTG Vision (Specify) Outcome: Progressing   Problem: RH BLADDER ELIMINATION Goal: RH STG MANAGE BLADDER WITH ASSISTANCE Description: STG Manage Bladder With Mod Assistance Outcome: Not Progressing Goal: RH STG MANAGE BLADDER WITH MEDICATION WITH ASSISTANCE Description: STG Manage Bladder With Medication With Mod I Assistance. Outcome: Not Progressing    Still requiring max/total assist for bowel/bladder management due to incontinence.

## 2020-02-18 NOTE — Progress Notes (Signed)
Occupational Therapy Session Note  Patient Details  Name: Nicholas [name] MRN: [id number] Date of Birth: 1951-05-15  Today's Date: 02/18/2020 OT Individual Time: 1530-1600 OT Individual Time Calculation (min): 30 min    Short Term Goals: Week 2:  OT Short Term Goal 1 (Week 2): Pt will maintain static sitting balance (unsupported) with min A in prep for dyanamic sititng balance in ADL OT Short Term Goal 2 (Week 2): Pt will don shirt with mod assist. OT Short Term Goal 3 (Week 2): Pt will perform sliding board transfers with max assist +1. OT Short Term Goal 4 (Week 2): Pt will wash UB with mod assist using BUE.  Skilled Therapeutic Interventions/Progress Updates:    Upon entering the room, pt supine in bed with wife present in the room. Pt with no c/o, signs, or symptoms of pain. Pt declined going outside during this session. Pt able to introduce wife sitting to the L with increased time and cuing to turn head and locate her. Supine >sit max A to EOB. Focus on B UE integration and L visual scanning. Pt picking up and holding card deck in L hand and opening cards with R hand. Pt then sorting cards by visually scanning to the L to find suite. Pt becoming internally distracted and needing maximal cuing to continue with task. Pt then shifting self backwards and needing total A to return to supine for safety. OT repositioned pt in bed and wife with questions regarding neglect. Pt remained in bed with call bell and all needed items within reach upon exiting the room.   Therapy Documentation Precautions:  Precautions Precautions: None Precaution Comments: Lt hemiparesis with Lt neglect ; watch BP Restrictions Weight Bearing Restrictions: No Other Position/Activity Restrictions: keep BP >160 General:   Vital Signs:l Therapy Vitals Temp: 98 F (36.7 C) Temp Source: Oral Pulse Rate: 87 Resp: 18 BP: 120/66 Patient Position (if appropriate): Lying Oxygen Therapy SpO2: 97 % O2 Device: Room  Air Pain: Pain Assessment Pain Scale: Faces Pain Score: 2  Faces Pain Scale: Hurts little more Pain Type: Acute pain Pain Location: Back Pain Orientation: Lower Pain Descriptors / Indicators: Aching;Sore Pain Onset: On-going Patients Stated Pain Goal: 0 Pain Intervention(s): Repositioned;Ambulation/increased activity ADL: ADL Eating: Maximal assistance Grooming: Minimal assistance Where Assessed-Grooming: Sitting at sink Upper Body Bathing: Maximal cueing, Moderate assistance Where Assessed-Upper Body Bathing: Shower Lower Body Bathing: Maximal assistance Where Assessed-Lower Body Bathing: Shower Upper Body Dressing: Moderate assistance Where Assessed-Upper Body Dressing: Wheelchair Lower Body Dressing: Maximal assistance Where Assessed-Lower Body Dressing: Bed level Toileting: Not assessed Toilet Transfer: Not assessed Psychologist, counselling Transfer: Not assessed Film/video editor Method: Other (comment)(roll in shower chair) Astronomer: Other (comment)   Therapy/Group: Individual Therapy  Alen Bleacher 02/18/2020, 4:06 PM

## 2020-02-18 NOTE — Progress Notes (Signed)
Occupational Therapy Session Note  Patient Details  Name: Nicholas [name] MRN: [id number] Date of Birth: 1950/12/02  Today's Date: 02/18/2020 OT Individual Time: 3557-3220 OT Individual Time Calculation (min): 60 min    Short Term Goals: Week 1:  OT Short Term Goal 1 (Week 1): Pt will maintain static sitting balance (unsupported) with min A in prep for dyanamic sititng balance in ADL with OT Short Term Goal 1 - Progress (Week 1): Progressing toward goal OT Short Term Goal 2 (Week 1): Pt will don shirt with mod A OT Short Term Goal 2 - Progress (Week 1): Progressing toward goal OT Short Term Goal 3 (Week 1): Pt will perform transfer of max A +1 with LRAD method OT Short Term Goal 3 - Progress (Week 1): Progressing toward goal OT Short Term Goal 4 (Week 1): PT will perform bed mobility in prep for ADL tasks with max A+1 (rolling/ supine to sit) OT Short Term Goal 4 - Progress (Week 1): Met Week 2:  OT Short Term Goal 1 (Week 2): Pt will maintain static sitting balance (unsupported) with min A in prep for dyanamic sititng balance in ADL OT Short Term Goal 2 (Week 2): Pt will don shirt with mod assist. OT Short Term Goal 3 (Week 2): Pt will perform sliding board transfers with max assist +1. OT Short Term Goal 4 (Week 2): Pt will wash UB with mod assist using BUE.     Skilled Therapeutic Interventions/Progress Updates:    Pt received in room with son present. Pt alert and attentive to therapy today.  From bed level, worked on core exercises with bridging and rolling side to side with knees bent while donning LB clothing.  Pt did help using B hands to partially pull pants over hips.   Focused on core strength with rolling fully onto R side and then pushing up to sit with mod A as pt used his R arm to help push up. Improved postural alignment in sitting EOB for 1 minute and then pt began "falling" to his L needed tactile cues to reach out with R arm to counter balance.   Using sliding board (2nd   Person held board in place), pt worked on scooting to his L with mod -max A of 1 demonstrating good initiation with his legs to weight shift forward and lift hips slightly.   In w/c, sat at sink to brush teeth with set up, doff old shirt and don new one with mod A and mod-max Cues.    Pt did start to verbalize more and even began to tell a story but after 2 sentences, just stopped.    Overall, improved initiation, attention, and motor skills today.   Pt resting in w.c with belt alarm on, seat belt on with all needs met.  Therapy Documentation Precautions:  Precautions Precautions: None Precaution Comments: Lt hemiparesis with Lt neglect ; watch BP Restrictions Weight Bearing Restrictions: No Other Position/Activity Restrictions: keep BP >160  Pain: Pain Assessment Pain Scale: Faces Pain Score: 0-No pain Faces Pain Scale: No hurt Pain Type: Chronic pain Pain Location: Back Pain Orientation: Lower Pain Descriptors / Indicators: Aching;Discomfort Pain Onset: Gradual Patients Stated Pain Goal: 0 Pain Intervention(s): Repositioned    Therapy/Group: Individual Therapy  [place] 02/18/2020, 12:17 PM

## 2020-02-19 ENCOUNTER — Inpatient Hospital Stay (HOSPITAL_COMMUNITY): Payer: BC Managed Care – PPO | Admitting: Speech Pathology

## 2020-02-19 ENCOUNTER — Inpatient Hospital Stay (HOSPITAL_COMMUNITY): Payer: BC Managed Care – PPO | Admitting: Occupational Therapy

## 2020-02-19 ENCOUNTER — Inpatient Hospital Stay (HOSPITAL_COMMUNITY): Payer: BC Managed Care – PPO | Admitting: Physical Therapy

## 2020-02-19 LAB — GLUCOSE, CAPILLARY
Glucose-Capillary: 72 mg/dL (ref 70–99)
Glucose-Capillary: 163 mg/dL — ABNORMAL HIGH (ref 70–99)
Glucose-Capillary: 123 mg/dL — ABNORMAL HIGH (ref 70–99)
Glucose-Capillary: 198 mg/dL — ABNORMAL HIGH (ref 70–99)

## 2020-02-19 NOTE — Progress Notes (Signed)
Speech Language Pathology Daily Session Note  Patient Details  Name: Nicholas [name] MRN: [id number] Date of Birth: 05/29/1951  Today's Date: 02/19/2020 SLP Individual Time: 0815-0900 SLP Individual Time Calculation (min): 45 min  Short Term Goals: Week 3: SLP Short Term Goal 1 (Week 3): Pt will sustain attention to functional tasks for 5-7 minute intervals with Max A cues. SLP Short Term Goal 2 (Week 3): Pt will recall new and daily information with Mod A for use of aids or strategies. SLP Short Term Goal 3 (Week 3): Pt will engage in functional tasks with Mod A for use of strategies for visual scanning and/or locating items on left side of environment. SLP Short Term Goal 4 (Week 3): Pt will demonstrate functional problem solving skills in basic familiar situations with Mod A multimodal cues. SLP Short Term Goal 5 (Week 3): Patient will follow 2 step directions with 80% accuracy and Mod A.  Skilled Therapeutic Interventions: Pt was seen for skilled ST targeting cognition. Upon arrival to room, pt was self-feeding breakfast beautifully, however Min A verbal and visual cues were provided for problem solving, attention, and visual scanning during PO intake. Although pt able to verbally describe and sequence steps of a 3-step action card sequencing task with Mod A verbal and visual cues for problem solving and error awareness, increased Max A for said skills was required for functional problem solving. Pt required Mod A verbal cues for redirection in 3-5 minute intervals to sustain attention to tasks. He verbally responded to ~80% of SLPs questions and expressed basic wants and needs throughout session with Min A verbal cues. Pt left laying in bed with alarm set and needs within reach. Continue per current plan of care.        Pain Pain Assessment Pain Scale: 0-10 Pain Score: 0-No pain  Therapy/Group: Individual Therapy  Nicholas [name] 02/19/2020, 7:01 AM

## 2020-02-19 NOTE — Progress Notes (Signed)
Physical Therapy Session Note  Patient Details  Name: Nicholas [name] MRN: [id number] Date of Birth: [birth year]  Today's Date: 02/19/2020 PT Individual Time: 9774-1423 PT Individual Time Calculation (min): 59 min   Short Term Goals: Week 3:  PT Short Term Goal 1 (Week 3): Patient to be able to complete all supine<->sit transfers with maxAx1 and use of bed features PT Short Term Goal 2 (Week 3): Patient to be able to consistently perform sliding board transfers wtih Mod-MaxA of 1 and standby of second person for safety PT Short Term Goal 3 (Week 3): Patient to tolerate 3-4 minutes in standing frame PT Short Term Goal 4 (Week 3): Paitent to initiate gait training  Skilled Therapeutic Interventions/Progress Updates:    Patient received in bed, willing to work with therapy today. Actually able to roll and perform sidelying to sit with MaxAx1 today however demonstrates ongoing L push and posterior lean requiring MinA to maintain sitting balance at EOB today. Able to transfer to chair with sliding board and Min-ModAx2 with Max cues for sequencing but did very well with this transfer today! Trialed standing at railing (able to stand with ModA but with heavy L lean and became weak very quickly without BUE support) and with 3 musketeers technique- able to stand with 3 musketeers technique but not able to take steps yet due to gross weakness and reduced postural control. Otherwise continued practicing standing in standing frame, tolerated 2 bouts of 2-3 minutes but limited by L knee pain. Left up in [facility] with all needs met, seatbelt alarm active and spouse present, all needs otherwise met.   Therapy Documentation Precautions:  Precautions Precautions: None Precaution Comments: Lt hemiparesis with Lt neglect ; watch BP Restrictions Weight Bearing Restrictions: No Other Position/Activity Restrictions: keep BP >160 Pain: Pain Assessment Pain Scale: Faces Pain Score: 0-No pain Faces Pain Scale: Hurts little  more Pain Type: Acute pain Pain Location: Knee Pain Orientation: Left Pain Descriptors / Indicators: Sore;Discomfort Pain Onset: On-going Patients Stated Pain Goal: 0 Pain Intervention(s): Repositioned;Distraction Multiple Pain Sites: No    Therapy/Group: Individual Therapy   Windell Norfolk, DPT, PN1   Supplemental Physical Therapist [place]    Pager [phone] Acute Rehab Office [phone]    02/19/2020, 3:54 PM

## 2020-02-19 NOTE — Progress Notes (Signed)
Coccyx pressure injury assessed this evening & is healed. Patient has pink skin, slight blanchable redness noted. A foam dressing was replaced for protection

## 2020-02-19 NOTE — Progress Notes (Signed)
Occupational Therapy Session Note  Patient Details  Name: Nicholas [name] MRN: [id number] Date of Birth: [birth year]  Today's Date: 02/19/2020 OT Individual Time: 2951-8841 OT Individual Time Calculation (min): 60 min    Short Term Goals: Week 2:  OT Short Term Goal 1 (Week 2): Pt will maintain static sitting balance (unsupported) with min A in prep for dyanamic sititng balance in ADL OT Short Term Goal 2 (Week 2): Pt will don shirt with mod assist. OT Short Term Goal 3 (Week 2): Pt will perform sliding board transfers with max assist +1. OT Short Term Goal 4 (Week 2): Pt will wash UB with mod assist using BUE.  Skilled Therapeutic Interventions/Progress Updates:    Pt received in bed ready for therapy with pt's son present.  From bed level, worked on LB self care with a focus on pt using bridging and rolling with doffing wet brief and donning new one.  Pt cleansed front perineal area and therapist washed buttocks.  Pt is able to bridge 50% of his range with mod cues.  To don pants, had pt cross legs in figure 4 with max A and then pt reached forward lifting head and flexing trunk actively to place each leg of shorts over feet with no A for L foot and mod A with R foot.  Pt used both hands on waistband of shorts to pull  75% over his hips as he bridged with mod cues.  Pt was able to don his R shoe with figure 4 in supine.   Worked on core strength with rolling to side and pushing up to sit with R arm with mod -max A. Initially with posterior lean in sit and then able to hold static sit with min A and dynamic sit with mod - max A as he doffed his shirt with mod cues but no A and donned new shirt with mod A and mod cues.  Used slide board with 2nd person holding board and max A to scoot across. Pt with less forward wt shift and lift today so 2nd person assisted with elevating hips.  Pt did help with hip lift with scooting back in w/c to adjust hips.    Pt taken to gym to work on trunk control, visual  scanning, reaching with a corn hole game activity. Pt positioned upright in wc with leg rests off.  1 person stood on his R for him to dynamically reach to his R to grab a bean bag and then therapist facilitated weight shift back to midline and forward for pt to throw bag with his R hand.  Mod cues to initiate and reach forward with mod A to shift his weight. Pt hesitant to do so, stood in front of pt and had him practice "pushing" me away with his R hand while his trunk was brought forward.  Pt continues to require a considerable amount of physical A but his attention, initiation and motor planning is improving.  Pt taken back to the room, son helped him with oral care, w.c belt and belt alarm on patient.    Therapy Documentation Precautions:  Precautions Precautions: None Precaution Comments: Lt hemiparesis with Lt neglect ; watch BP Restrictions Weight Bearing Restrictions: No Other Position/Activity Restrictions: keep BP >160  Pain: Pain Assessment Pain Scale: 0-10 Pain Score: 0-No pain ADL: ADL Eating: Maximal assistance Grooming: Minimal assistance Where Assessed-Grooming: Sitting at sink Upper Body Bathing: Maximal cueing, Moderate assistance Where Assessed-Upper Body Bathing: Shower Lower Body Bathing: Maximal  assistance Where Assessed-Lower Body Bathing: Shower Upper Body Dressing: Moderate assistance Where Assessed-Upper Body Dressing: Wheelchair Lower Body Dressing: Maximal assistance Where Assessed-Lower Body Dressing: Bed level Toileting: Not assessed Toilet Transfer: Not assessed Gaffer Transfer: Not assessed Social research officer, government Method: Other (comment)(roll in shower chair) Youth worker: Other (comment)   Therapy/Group: Individual Therapy  [place] 02/19/2020, 10:12 AM

## 2020-02-19 NOTE — Progress Notes (Signed)
[place] PHYSICAL MEDICINE & REHABILITATION PROGRESS NOTE   Subjective/Complaints: Patient seen laying in bed this morning.  He states he slept well overnight.  No reported issues overnight.  He keeps his right eye open today.  ROS: Denies neck pain, CP, SOB, N/V/D  Objective:   No results found. No results for input(s): WBC, HGB, HCT, PLT in the last 72 hours. No results for input(s): NA, K, CL, CO2, GLUCOSE, BUN, CREATININE, CALCIUM in the last 72 hours.  Intake/Output Summary (Last 24 hours) at 02/19/2020 0858 Last data filed at 02/19/2020 0631 Gross per 24 hour  Intake 720 ml  Output --  Net 720 ml     Physical Exam: Vital Signs Blood pressure (!) 143/85, pulse 76, temperature 97.8 F (36.6 C), temperature source Oral, resp. rate 18, height 6' (1.829 m), weight 89.8 kg, SpO2 98 %. Constitutional: No distress . Vital signs reviewed. HENT: Normocephalic.  Atraumatic. Eyes: EOMI. No discharge. Cardiovascular: No JVD. Respiratory: Normal effort.  No stridor. GI: Non-distended. Skin: Warm and dry.  Intact. Psych: Normal mood.  Normal behavior. Musc: No edema in extremities.  No tenderness in extremities. Neuro: Alert Motor: Motor: LUE: 4 -/5 proximal distal with apraxia, unchanged LLE: 1+-2-/5 proximal distal, unchanged Tone difficult to assess due to patient resistance  Assessment/Plan: 1. Functional deficits secondary to [facility] which require 3+ hours per day of interdisciplinary therapy in a comprehensive inpatient rehab setting.  Physiatrist is providing close team supervision and 24 hour management of active medical problems listed below.  Physiatrist and rehab team continue to assess barriers to discharge/monitor patient progress toward functional and medical goals  Care Tool:  Bathing    Body parts bathed by patient: Abdomen, Front perineal area, Left upper leg, Face   Body parts bathed by helper: Right arm, Left arm, Chest, Buttocks, Right lower leg, Left lower  leg     Bathing assist Assist Level: Total Assistance - Patient < 25%     Upper Body Dressing/Undressing Upper body dressing   What is the patient wearing?: Pull over shirt    Upper body assist Assist Level: Moderate Assistance - Patient 50 - 74%    Lower Body Dressing/Undressing Lower body dressing      What is the patient wearing?: Pants     Lower body assist Assist for lower body dressing: Maximal Assistance - Patient 25 - 49%     Toileting Toileting    Toileting assist Assist for toileting: 2 Helpers(using Stedy + BSC)     Transfers Chair/bed transfer  Transfers assist     Chair/bed transfer assist level: 2 Helpers     Locomotion Ambulation   Ambulation assist   Ambulation activity did not occur: Safety/medical concerns          Walk 10 feet activity   Assist  Walk 10 feet activity did not occur: Safety/medical concerns        Walk 50 feet activity   Assist Walk 50 feet with 2 turns activity did not occur: Safety/medical concerns         Walk 150 feet activity   Assist Walk 150 feet activity did not occur: Safety/medical concerns         Walk 10 feet on uneven surface  activity   Assist Walk 10 feet on uneven surfaces activity did not occur: Safety/medical concerns         Wheelchair     Assist Will patient use wheelchair at discharge?: Yes Type of Wheelchair: Manual  Wheelchair assist level: Dependent - Patient 0% Max wheelchair distance: 150 ft    Wheelchair 50 feet with 2 turns activity    Assist        Assist Level: Dependent - Patient 0%   Wheelchair 150 feet activity     Assist      Assist Level: Dependent - Patient 0%    Medical Problem List and Plan: 1.  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  Status post cerebral angiogram balloon angioplasty  Continue CIR 15/17  Bilateral PRAFO 2.  Antithrombotics: -DVT/anticoagulation: Lovenox initiated  01/29/2020             -antiplatelet therapy: Aspirin 325 mg daily 3. Pain Management: Oxycodone as needed, Robaxin as needed. Heating pad to relax tight paraspinals of cervical and lumbar spine  Changed Oxycodone to Norco 7.5 mg/325 mg q4 hours per son request since been so sedated.  Muscle rub ordered on 5/27, scheduled may be used for neck low back and buttock area  Appears to be controlled on 6/1 4. Mood: Provide emotional support             -antipsychotic agents: N/A  Zoloft daily.   Amantadine 100 with breakfast and lunch started on 5/26 after discussion with therapies, improved after discussion with therapies. 5. Neuropsych: This patient is not capable of making decisions on his own behalf. 6. Skin/Wound Care: Routine skin checks 7. Fluids/Electrolytes/Nutrition: Routine in and outs   Carb modified diet- p.o. intake improved. 8.  Diabetes mellitus with hyperglycemia.  Hemoglobin A1c 7.7.  Check blood sugars before meals and at bedtime.  Patient on Tresiba 35 units daily, Glucotrol 10 mg daily, Actos 45 mg daily, Janumet 50-1000 mg twice daily, NovoLog 75 units daily prior to admission.               Levemir 31U BID, decreased to 28 twice daily on 5/26, decreased to 25 BID on 5/28  NovoLog 3 units 3 times daily started on 5/26, increased to 5 TID on 5/28  Labile on 6/1, will consider further decreases tomorrow in long-acting if necessary CBG (last 3)  Recent Labs    02/18/20 1627 02/18/20 2043 02/19/20 0621  GLUCAP 89 167* 72   9.  Seizure prophylaxis. Keppra 500 mg twice daily, decreased to 250 twice daily on 5/25, d/ced on 5/28 10.  Arrhythmia/wide-complex tachycardia.    Amiodarone 200 mg daily x4 weeks (~6/12).  Follow-up cardiology services 11.  Hypertension with orthostasis.  Autoregulate on Nimotop.  Elevated at rest, however orthostasis noted by therapies, will avoid overcorrection   TEDS and abdominal binder as needed   Labile on 6/1 12.  Hyperlipidemia.   Crestor/fenofibrate 13. Mild vasospasm evident on 5/14 vascular transcranial doppler. 14. Spasticity:  Started baclofen 5mg  TID for pain relief and spasticity, changed to nightly due to ?lethargy.  Will consider d/cing in future if appropriate 15.  Hyponatremia: Resolved  Sodium 139 on 5/28  Continue to monitor 16. Transaminitis: Resolved  LFTs WNL on 5/28  Continue to monitor 17.  Acute blood loss anemia  Hemoglobin 11.6 on 5/25, labs ordered for tomorrow  Continue to monitor  LOS: 17 days A FACE TO FACE EVALUATION WAS PERFORMED  Syrianna Schillaci Lorie Phenix 02/19/2020, 8:58 AM

## 2020-02-19 NOTE — Plan of Care (Signed)
  Problem: RH BOWEL ELIMINATION Goal: RH STG MANAGE BOWEL W/MEDICATION W/ASSISTANCE Description: STG Manage Bowel with Medication with  Mod Assistance. Outcome: Progressing   Problem: RH BLADDER ELIMINATION Goal: RH STG MANAGE BLADDER WITH MEDICATION WITH ASSISTANCE Description: STG Manage Bladder With Medication With Mod I Assistance. Outcome: Progressing   Problem: RH SKIN INTEGRITY Goal: RH STG SKIN FREE OF INFECTION/BREAKDOWN Description: Mod I Outcome: Progressing   Problem: RH SAFETY Goal: RH STG ADHERE TO SAFETY PRECAUTIONS W/ASSISTANCE/DEVICE Description: STG Adhere to Safety Precautions With Min Assistance/Device. Outcome: Progressing Goal: RH STG DECREASED RISK OF FALL WITH ASSISTANCE Description: STG Decreased Risk of Fall With [employer]. Outcome: Progressing   Problem: RH COGNITION-NURSING Goal: RH STG ANTICIPATES NEEDS/CALLS FOR ASSIST W/ASSIST/CUES Description: STG Anticipates Needs/Calls for Assist With Min  Assistance/Cues. Outcome: Progressing   Problem: RH Vision Goal: RH LTG Vision (Specify) Outcome: Progressing   Problem: RH BLADDER ELIMINATION Goal: RH STG MANAGE BLADDER WITH ASSISTANCE Description: STG Manage Bladder With Mod Assistance Outcome: Not Progressing

## 2020-02-20 ENCOUNTER — Inpatient Hospital Stay (HOSPITAL_COMMUNITY): Payer: BC Managed Care – PPO | Admitting: Occupational Therapy

## 2020-02-20 ENCOUNTER — Inpatient Hospital Stay (HOSPITAL_COMMUNITY): Payer: BC Managed Care – PPO | Admitting: Speech Pathology

## 2020-02-20 ENCOUNTER — Inpatient Hospital Stay (HOSPITAL_COMMUNITY): Payer: BC Managed Care – PPO | Admitting: Physical Therapy

## 2020-02-20 LAB — GLUCOSE, CAPILLARY
Glucose-Capillary: 87 mg/dL (ref 70–99)
Glucose-Capillary: 97 mg/dL (ref 70–99)
Glucose-Capillary: 153 mg/dL — ABNORMAL HIGH (ref 70–99)
Glucose-Capillary: 162 mg/dL — ABNORMAL HIGH (ref 70–99)

## 2020-02-20 LAB — CBC WITH DIFFERENTIAL/PLATELET
Abs Immature Granulocytes: 0.01 10*3/uL (ref 0.00–0.07)
Basophils Absolute: 0 10*3/uL (ref 0.0–0.1)
Basophils Relative: 1 %
Eosinophils Absolute: 0.1 10*3/uL (ref 0.0–0.5)
Eosinophils Relative: 2 %
HCT: 33.8 % — ABNORMAL LOW (ref 39.0–52.0)
Hemoglobin: 11 g/dL — ABNORMAL LOW (ref 13.0–17.0)
Immature Granulocytes: 0 %
Lymphocytes Relative: 27 %
Lymphs Abs: 1.1 10*3/uL (ref 0.7–4.0)
MCH: 29.6 pg (ref 26.0–34.0)
MCHC: 32.5 g/dL (ref 30.0–36.0)
MCV: 91.1 fL (ref 80.0–100.0)
Monocytes Absolute: 0.5 10*3/uL (ref 0.1–1.0)
Monocytes Relative: 11 %
Neutro Abs: 2.4 10*3/uL (ref 1.7–7.7)
Neutrophils Relative %: 59 %
Platelets: 239 10*3/uL (ref 150–400)
RBC: 3.71 MIL/uL — ABNORMAL LOW (ref 4.22–5.81)
RDW: 15.5 % (ref 11.5–15.5)
WBC: 4 10*3/uL (ref 4.0–10.5)
nRBC: 0 % (ref 0.0–0.2)

## 2020-02-20 NOTE — Progress Notes (Signed)
Team Conference Report to Patient/Family  Team Conference discussion was reviewed with the patient and caregiver, including goals, any changes in plan of care and target discharge date.  Patient and caregiver express understanding and are in agreement.  The patient has a target discharge date of 03/04/20.  Nicholas [name] 02/20/2020, 2:07 PM

## 2020-02-20 NOTE — Progress Notes (Signed)
Speech Language Pathology Daily Session Note  Patient Details  Name: DORN HARTSHORNE MRN: [id number] Date of Birth: 05-07-1951  Today's Date: 02/20/2020 SLP Individual Time: 0815-0900 SLP Individual Time Calculation (min): 45 min  Short Term Goals: Week 3: SLP Short Term Goal 1 (Week 3): Pt will sustain attention to functional tasks for 5-7 minute intervals with Max A cues. SLP Short Term Goal 2 (Week 3): Pt will recall new and daily information with Mod A for use of aids or strategies. SLP Short Term Goal 3 (Week 3): Pt will engage in functional tasks with Mod A for use of strategies for visual scanning and/or locating items on left side of environment. SLP Short Term Goal 4 (Week 3): Pt will demonstrate functional problem solving skills in basic familiar situations with Mod A multimodal cues. SLP Short Term Goal 5 (Week 3): Patient will follow 2 step directions with 80% accuracy and Mod A.  Skilled Therapeutic Interventions: Pt was seen for skilled ST targeting cognition. Overall Min A verbal and visual cues provided for locating items on left and basic problem solving as pt finished self feeding his breakfast. SLP further facilitated session with basic money management tasks. Pt sorted money (4 types of coins and bills) with a visual aid and Mod A verbal cues for scanning and error awareness. He added small amount of change with no more than Min A verbal cues for problem solving (ex: 2 nickles, 3 quarters, etc.), however Max A verbal and visual cues were required to calculate a grand total. Total A required to display set amounts of change. Pt required Mod A verbal and visual cues for redirection to sustain attention to tasks in 5 minute intervals. Pt left laying in bed with alarm set and needs within reach. Continue per current plan of care.          Pain Pain Assessment Pain Scale: Faces Pain Score: 0-No pain Faces Pain Scale: No hurt  Therapy/Group: Individual Therapy  [name] 02/20/2020, 7:02 AM

## 2020-02-20 NOTE — Progress Notes (Signed)
Physical Therapy Session Note  Patient Details  Name: Nicholas [name] MRN: [id number] Date of Birth: [birth year]  Today's Date: 02/20/2020 PT Individual Time: 7425-9563 PT Individual Time Calculation (min): 46 min   Short Term Goals: Week 3:  PT Short Term Goal 1 (Week 3): Patient to be able to complete all supine<->sit transfers with maxAx1 and use of bed features PT Short Term Goal 2 (Week 3): Patient to be able to consistently perform sliding board transfers wtih Mod-MaxA of 1 and standby of second person for safety PT Short Term Goal 3 (Week 3): Patient to tolerate 3-4 minutes in standing frame PT Short Term Goal 4 (Week 3): Paitent to initiate gait training  Skilled Therapeutic Interventions/Progress Updates:    Patient received up in [facility], ready for therapy; dependent for mobility to gym in [facility], however once arriving in gym found that he soiled himself incontinently so returned to his room to get cleaned up. Required MaxAx2 for multiple sliding board transfers today due to heavy left lean and poor initiation, had a very hard time with sequencing/inititation even with max cues from therapist today. Needed maxAX2 for supine<->sit transfers, but able to roll to either side this afternoon with MinA and Mod-max cues for sequencing. TotalA for donning/doffing pants and brief, but once up at [facility] able to provide [place] for changing his shirt although had strong posterior lean requiring MaxA to maintain upright. Left up in [facility] with all needs met, son [name] providing direct supervision. Seemed much more fatigued this afternoon. [name] does report that Bliss has had chronic L knee pain for some time and had surgery in the 70s- will continue to monitor L knee pain with mobility.   Therapy Documentation Precautions:  Precautions Precautions: None Precaution Comments: Lt hemiparesis with Lt neglect ; watch BP Restrictions Weight Bearing Restrictions: No Other Position/Activity Restrictions: keep BP >160  Pain  Assessment Pain Scale: 0-10 Pain Score: 3  Pain Type: Acute pain Pain Location: Generalized Pain Orientation: Other (Comment)(generalized) Pain Descriptors / Indicators: Discomfort;Sore Pain Onset: On-going Patients Stated Pain Goal: 0 Pain Intervention(s): Repositioned;Distraction Multiple Pain Sites: No    Therapy/Group: Individual Therapy   Windell Norfolk, DPT, PN1   Supplemental Physical Therapist [place]    Pager [phone] Acute Rehab Office [phone]    02/20/2020, 3:27 PM

## 2020-02-20 NOTE — Progress Notes (Signed)
[place] PHYSICAL MEDICINE & REHABILITATION PROGRESS NOTE   Subjective/Complaints: Patient seen laying in bed this AM.  He states he slept well overnight. He is more alert this AM, continues to require increased time to respond.   ROS: Denies neck pain, CP, SOB, N/V/D  Objective:   No results found. No results for input(s): WBC, HGB, HCT, PLT in the last 72 hours. No results for input(s): NA, K, CL, CO2, GLUCOSE, BUN, CREATININE, CALCIUM in the last 72 hours.  Intake/Output Summary (Last 24 hours) at 02/20/2020 0804 Last data filed at 02/19/2020 1700 Gross per 24 hour  Intake 612 ml  Output --  Net 612 ml     Physical Exam: Vital Signs Blood pressure 129/89, pulse 80, temperature (!) 97.5 F (36.4 C), resp. rate 16, height 6' (1.829 m), weight 89.8 kg, SpO2 99 %. Constitutional: No distress . Vital signs reviewed. HENT: Normocephalic.  Atraumatic. Eyes: EOMI. No discharge. Cardiovascular: No JVD. Respiratory: Normal effort.  No stridor. GI: Non-distended. Skin: Warm and dry.  Intact. Psych: Normal mood.  Normal behavior. Musc: No edema in extremities.  No tenderness in extremities. Neuro: Alert Motor: Motor: LUE: 4 -/5 proximal distal with apraxia, stable LLE: 1+-2-/5 proximal distal, stable Tone difficult to assess due to patient resistance  Assessment/Plan: 1. Functional deficits secondary to [facility] which require 3+ hours per day of interdisciplinary therapy in a comprehensive inpatient rehab setting.  Physiatrist is providing close team supervision and 24 hour management of active medical problems listed below.  Physiatrist and rehab team continue to assess barriers to discharge/monitor patient progress toward functional and medical goals  Care Tool:  Bathing    Body parts bathed by patient: Abdomen, Front perineal area, Left upper leg, Face   Body parts bathed by helper: Right arm, Left arm, Chest, Buttocks, Right lower leg, Left lower leg     Bathing assist  Assist Level: Total Assistance - Patient < 25%     Upper Body Dressing/Undressing Upper body dressing   What is the patient wearing?: Pull over shirt    Upper body assist Assist Level: Moderate Assistance - Patient 50 - 74%    Lower Body Dressing/Undressing Lower body dressing      What is the patient wearing?: Pants     Lower body assist Assist for lower body dressing: Maximal Assistance - Patient 25 - 49%     Toileting Toileting    Toileting assist Assist for toileting: 2 Helpers(using Stedy + BSC)     Transfers Chair/bed transfer  Transfers assist     Chair/bed transfer assist level: 2 Helpers     Locomotion Ambulation   Ambulation assist   Ambulation activity did not occur: Safety/medical concerns          Walk 10 feet activity   Assist  Walk 10 feet activity did not occur: Safety/medical concerns        Walk 50 feet activity   Assist Walk 50 feet with 2 turns activity did not occur: Safety/medical concerns         Walk 150 feet activity   Assist Walk 150 feet activity did not occur: Safety/medical concerns         Walk 10 feet on uneven surface  activity   Assist Walk 10 feet on uneven surfaces activity did not occur: Safety/medical concerns         Wheelchair     Assist Will patient use wheelchair at discharge?: Yes Type of Wheelchair: Chief of Staff  assist level: Dependent - Patient 0% Max wheelchair distance: 150 ft    Wheelchair 50 feet with 2 turns activity    Assist        Assist Level: Dependent - Patient 0%   Wheelchair 150 feet activity     Assist      Assist Level: Dependent - Patient 0%    Medical Problem List and Plan: 1.  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  Status post cerebral angiogram balloon angioplasty  Continue CIR 15/17  Bilateral PRAFO  Team conference today to discuss current and goals and coordination of care, home and  environmental barriers, and discharge planning with nursing, case manager, and therapies.  2.  Antithrombotics: -DVT/anticoagulation: Lovenox initiated 01/29/2020             -antiplatelet therapy: Aspirin 325 mg daily 3. Pain Management: Oxycodone as needed, Robaxin as needed. Heating pad to relax tight paraspinals of cervical and lumbar spine  Changed Oxycodone to Norco 7.5 mg/325 mg q4 hours per son request since been so sedated.  Muscle rub ordered on 5/27, scheduled may be used for neck low back and buttock area  Appears to be controlled on 6/2 4. Mood: Provide emotional support             -antipsychotic agents: N/A  Zoloft daily.   Amantadine 100 with breakfast and lunch started on 5/26 after discussion with therapies, improved after discussion with therapies. 5. Neuropsych: This patient is not capable of making decisions on his own behalf. 6. Skin/Wound Care: Routine skin checks 7. Fluids/Electrolytes/Nutrition: Routine in and outs   Carb modified diet- p.o. intake improved. 8.  Diabetes mellitus with hyperglycemia.  Hemoglobin A1c 7.7.  Check blood sugars before meals and at bedtime.  Patient on Tresiba 35 units daily, Glucotrol 10 mg daily, Actos 45 mg daily, Janumet 50-1000 mg twice daily, NovoLog 75 units daily prior to admission.               Levemir 31U BID, decreased to 28 twice daily on 5/26, decreased to 25 BID on 5/28  NovoLog 3 units 3 times daily started on 5/26, increased to 5 TID on 5/28  Labile on 6/2 CBG (last 3)  Recent Labs    02/19/20 1655 02/19/20 2101 02/20/20 0546  GLUCAP 123* 198* 87   9.  Seizure prophylaxis. Keppra 500 mg twice daily, decreased to 250 twice daily on 5/25, d/ced on 5/28 10.  Arrhythmia/wide-complex tachycardia. Amiodarone 200 mg daily x4 weeks (~6/12).  Follow-up cardiology services 11.  Hypertension with orthostasis.  Autoregulate on Nimotop.  Elevated at rest, however orthostasis noted by therapies, will avoid overcorrection   TEDS  and abdominal binder as needed   Controlled on 6/2 12.  Hyperlipidemia.  Crestor/fenofibrate 13. Mild vasospasm evident on 5/14 vascular transcranial doppler. 14. Spasticity:  Started baclofen 5mg  TID for pain relief and spasticity, d/ced on 6/2 15.  Hyponatremia: Resolved  Sodium 139 on 5/28  Continue to monitor 16. Transaminitis: Resolved  LFTs WNL on 5/28  Continue to monitor 17.  Acute blood loss anemia  Hemoglobin 11.6 on 5/25, labs pending  Continue to monitor  LOS: 18 days A FACE TO FACE EVALUATION WAS PERFORMED  Nicholas [name] Lorie Phenix 02/20/2020, 8:04 AM

## 2020-02-20 NOTE — Patient Care Conference (Signed)
Inpatient RehabilitationTeam Conference and Plan of Care Update Date: 02/20/2020   Time: 2:45 PM    Patient Name: Nicholas [name]      Medical Record Number: [id number]  Date of Birth: [birth year] Sex: Male         Room/Bed: 4W01C/4W01C-01 Payor Info: Payor: BLUE [employer] BLUE [employer]IELD / Plan: [employer] COMM PPO / Product Type: *No Product type* /    Admit Date/Time:  02/02/2020  3:33 PM  Primary Diagnosis:  SDH (subdural hematoma) (HCC)  Patient Active Problem List   Diagnosis Date Noted  . Seizure prophylaxis   . Spastic hemiparesis (HCC)   . Labile blood pressure   . Labile blood glucose   . Pressure injury of skin 02/05/2020  . Orthostasis   . Acute blood loss anemia   . Hypertensive crisis   . Hyponatremia   . SDH (subdural hematoma) (HCC)   . Transaminitis   . Wide-complex tachycardia (HCC)   . Intracranial vasospasm   . Essential hypertension   . Controlled type 2 diabetes mellitus with hyperglycemia (HCC)   . Hepatitis   . Abdominal distention   . NSVT (nonsustained ventricular tachycardia) (HCC)   . Rate-related bundle branch block   . Acute hypoxemic respiratory failure (HCC)   . Aneurysm (HCC)   . SAH (subarachnoid hemorrhage) (HCC) 01/14/2020  . Other and unspecified hyperlipidemia 07/17/2013  . Need for prophylactic vaccination and inoculation against influenza 07/17/2013  . Type II or unspecified type diabetes mellitus without mention of complication, not stated as uncontrolled 04/12/2013  . 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 Elevated transaminase measurement 04/12/2013  . Type II or unspecified type diabetes mellitus without mention of complication, uncontrolled 01/05/2013  . Essential hypertension, benign 01/05/2013  . BPH (benign prostatic hyperplasia) 01/05/2013  . Overweight(278.02) 01/05/2013    Expected Discharge Date: Expected Discharge Date: 03/04/20  Team Members Present: Physician leading conference: Dr. Maryla Morrow Care Coodinator Present: Cheyenne Adas, RN,  BSN, CRRN;Christina Vita Barley, BSW Nurse Present: Ronny Bacon, RN PT Present: Nedra Hai, PT OT Present: Primitivo Gauze, OT SLP Present: Suzzette Righter, CF-SLP PPS Coordinator present : Edson Snowball, Park Breed, SLP     Current Status/Progress Goal Weekly Team Focus  Bowel/Bladder   Patient is incontinent of bowel and bladder. LBM 02/19/20  Patient will regain bowel and bladder continence  Assess toileting needs Qshift and PRN. Implement timed toileting and strict pericare to maintain skin integrity.   Swallow/Nutrition/ Hydration             ADL's   improved attention, initiation, motor planning overall.  progressing with pt's active participation in self care (mod A UB, max -total LB bed level), overall slow but stedy progress from last week  min A toilet transfer, dressing, bathing; mod A toileting and shower transfer (goals may need to be downgraded to mod -max A)  postural training, balance, L side awareness, LUE NMR, visual scanning, cognition, ADL training   Mobility   improved attention and participation and coming along nicely! Still needs 2 helpers but level of assist varies from Min-max depending on task and fatigue. Standing in standing frame 2-3 minutes. Sliding board transfers getting much better.  mod-maxA, WC with S  bed mobility, transfers ,standing and OOB tolerance, pre-gait as able   Communication   Min-Mod verbal initiation depending on fatigue  Min  overall verbal output and engagement   Safety/Cognition/ Behavioral Observations  Mod-Max  Min  initiation, sustained attention (improving), functional problem solving, recall, error awareness  Pain   Patient's pain is well managed PRN meds.  No c/o or s/sx of pain overnight.  Patient will continue with satisfactory pain levels less than 3.  Assess pain Qshift and PRN. Anticipate need for pain management with movement and care.   Skin   Patient has healed stage 1 to sacrum  Maintain skin healing and integrity  with peri care, barrier creams and foam dressing. Encourage balanced nutrition.  Assess skin Qshift and PRN. Continue frequent turns and repositions; strict peri care.    Rehab Goals Patient on target to meet rehab goals: Yes Rehab Goals Revised: progressing with current goals *See Care Plan and progress notes for long and short-term goals.     Barriers to Discharge  Current Status/Progress Possible Resolutions Date Resolved   Nursing                  PT  Home environment access/layout;Behavior;Inaccessible home environment;Incontinence  overall still very heavy levels of physical assist; delayed procesisng itme and reduced cognition              OT                  SLP                Care Coordinator Medical stability Spouse plans to d/c patient home. Family will need extensive educations on target          Discharge Planning/Teaching Needs:  Patient plans to discharge home with spouse and sons to provide care  Will schedule if recommended.   Team Discussion:  Overall improvements noted this week; CBGs better, insomnia improved, and decreased fatigue. Pain in shoulder improved = MD discontinued baclofen. Making progress; attending to therapy, more involved, initiating and note improved sustained attention. Perceptual deficits and questionable arthritis in left knee causing discomfort and limiting progression.  Revisions to Treatment Plan:  None    Medical Summary Current Status: Left-sided weakness secondary to left greater than rightSDH and  SAH.  Status post cerebral angiogram balloon angioplasty Weekly Focus/Goal: Improve mobility, BP, CBGs, cognition  Barriers to Discharge: Behavior;Medical stability;Incontinence   Possible Resolutions to Barriers: Therapies, follow labs, optimize BP/DM meds   Continued Need for Acute Rehabilitation Level of Care: The patient requires daily medical management by a physician with specialized training in physical medicine and rehabilitation  for the following reasons: Direction of a multidisciplinary physical rehabilitation program to maximize functional independence : Yes Medical management of patient stability for increased activity during participation in an intensive rehabilitation regime.: Yes Analysis of laboratory values and/or radiology reports with any subsequent need for medication adjustment and/or medical intervention. : Yes   I attest that I was present, lead the team conference, and concur with the assessment and plan of the team.   Dorien Chihuahua B 02/20/2020, 2:45 PM

## 2020-02-20 NOTE — Plan of Care (Signed)
  Problem: Consults Goal: RH STROKE PATIENT EDUCATION Description: See Patient Education module for education specifics  Outcome: Progressing   Problem: RH BLADDER ELIMINATION Goal: RH STG MANAGE BLADDER WITH MEDICATION WITH ASSISTANCE Description: STG Manage Bladder With Medication With Mod I Assistance. Outcome: Progressing   Problem: RH SKIN INTEGRITY Goal: RH STG SKIN FREE OF INFECTION/BREAKDOWN Description: Mod I Outcome: Progressing   Problem: RH SAFETY Goal: RH STG ADHERE TO SAFETY PRECAUTIONS W/ASSISTANCE/DEVICE Description: STG Adhere to Safety Precautions With Min Assistance/Device. Outcome: Progressing Goal: RH STG DECREASED RISK OF FALL WITH ASSISTANCE Description: STG Decreased Risk of Fall With [employer]. Outcome: Progressing   Problem: RH COGNITION-NURSING Goal: RH STG ANTICIPATES NEEDS/CALLS FOR ASSIST W/ASSIST/CUES Description: STG Anticipates Needs/Calls for Assist With Min  Assistance/Cues. Outcome: Progressing   Problem: RH Vision Goal: RH LTG Vision (Specify) Outcome: Progressing   Problem: RH BOWEL ELIMINATION Goal: RH STG MANAGE BOWEL W/MEDICATION W/ASSISTANCE Description: STG Manage Bowel with Medication with  Mod Assistance. Outcome: Not Progressing   Problem: RH BLADDER ELIMINATION Goal: RH STG MANAGE BLADDER WITH ASSISTANCE Description: STG Manage Bladder With Mod Assistance Outcome: Not Progressing

## 2020-02-20 NOTE — Progress Notes (Signed)
Occupational Therapy Weekly Progress Note  Patient Details  Name: Nicholas [name] MRN: [id number] Date of Birth: [birth year]  Beginning of progress report period: Feb 11, 2020 End of progress report period: February 20, 2020  Today's Date: 02/20/2020 OT Individual Time: 1000-1100 OT Individual Time Calculation (min): 60 min    Patient has met 4 of 4 short term goals.  Pt has demonstrated progress with his attention, energy levels, initiation, visual focus, response time, LUE functional use and postural control.  He continues to need a considerable amount of A but he is now more actively involved and able to benefit more from therapy.    Patient continues to demonstrate the following deficits: muscle weakness, decreased cardiorespiratoy endurance, motor apraxia, decreased visual perceptual skills, decreased attention to left, decreased awareness, decreased problem solving and delayed processing and decreased sitting balance, decreased standing balance, decreased postural control, hemiplegia and decreased balance strategies and therefore will continue to benefit from skilled OT intervention to enhance overall performance with BADL and Reduce care partner burden.  Patient progressing toward long term goals..  Continue plan of care.  OT Short Term Goals Week 1:  OT Short Term Goal 1 (Week 1): Pt will maintain static sitting balance (unsupported) with min A in prep for dyanamic sititng balance in ADL with OT Short Term Goal 1 - Progress (Week 1): Progressing toward goal OT Short Term Goal 2 (Week 1): Pt will don shirt with mod A OT Short Term Goal 2 - Progress (Week 1): Progressing toward goal OT Short Term Goal 3 (Week 1): Pt will perform transfer of max A +1 with LRAD method OT Short Term Goal 3 - Progress (Week 1): Progressing toward goal OT Short Term Goal 4 (Week 1): PT will perform bed mobility in prep for ADL tasks with max A+1 (rolling/ supine to sit) OT Short Term Goal 4 - Progress (Week 1):  Met Week 2:  OT Short Term Goal 1 (Week 2): Pt will maintain static sitting balance (unsupported) with min A in prep for dyanamic sititng balance in ADL OT Short Term Goal 1 - Progress (Week 2): Met OT Short Term Goal 2 (Week 2): Pt will don shirt with mod assist. OT Short Term Goal 2 - Progress (Week 2): Met OT Short Term Goal 3 (Week 2): Pt will perform sliding board transfers with max assist +1. OT Short Term Goal 3 - Progress (Week 2): Met OT Short Term Goal 4 (Week 2): Pt will wash UB with mod assist using BUE. OT Short Term Goal 4 - Progress (Week 2): Met Week 3:  OT Short Term Goal 1 (Week 3): Pt will be able to maintain his balance at EOB with S while completing UB bathing to demonstrate improved postural control. OT Short Term Goal 2 (Week 3): Pt will be able to don a shirt with min A. OT Short Term Goal 3 (Week 3): Pt will be able to push from sit to stand with max A of 1 to prepare for clothing management over hips. OT Short Term Goal 4 (Week 3): Pt will be able to use slide board with mod A of 1 and 2nd person to stabilize the board.  Skilled Therapeutic Interventions/Progress Updates:    Pt seen this session with the same focus as yesterday to keep a "routine" for patient.  Initially he was in bed with a wet and soiled brief and incontinent of bowel.  Pt cleansed.   From bed level, worked on LB self care with  a focus on pt using bridging  with doffing wet brief and donning new one.  Pt cleansed front perineal area and therapist washed buttocks.  Pt is able to bridge 50% of his range with mod cues.  To don pants, had pt cross legs in figure 4 with max A and then pt reached forward lifting head and flexing trunk actively to place each leg of shorts over feet with no A for L foot and mod A with R foot.  Pt used both hands on waistband of shorts to pull  75% over his hips as he bridged with mod cues but able to lift his hips higher to 75% of his range.  Pt was able to don his R shoe with  figure 4 in supine.   Worked on core strength with rolling to side and pushing up to sit with R arm and L arm with mod A. Initially with posterior lean in sit and then able to hold static sit with min A and dynamic sit with mod - max A as he washed UB. used B hands with hand over hand guidance to start movement of L hand and then pt followed through.  donned new shirt with mod A and mod cues.  Used slide board with 2nd person holding board and max A to scoot across. Pt with improved forward wt shift and lift today.  Pt did help with hip lift with scooting back in w/c to adjust hips.  he then got "stuck" and could not initiate using his back muscles to lean against the back to the w/c.    Education with pt and son on using visual references for his postural control. For ex, cues to lean towards something vs pushing or pulling to correct his balance.  Pt in wc with son in the room to assist his dad with oral care.   Therapy Documentation Precautions:  Precautions Precautions: None Precaution Comments: Lt hemiparesis with Lt neglect ; watch BP Restrictions Weight Bearing Restrictions: No Other Position/Activity Restrictions: keep BP >160     Pain: Pain Assessment Pain Scale: Faces Faces Pain Scale: No hurt ADL: ADL Eating: Maximal assistance Grooming: Minimal assistance Where Assessed-Grooming: Sitting at sink Upper Body Bathing: Minimal cueing, Minimal assistance Where Assessed-Upper Body Bathing: Edge of bed Lower Body Bathing: Maximal assistance Where Assessed-Lower Body Bathing: Bed level Upper Body Dressing: Moderate assistance Where Assessed-Upper Body Dressing: Edge of bed Lower Body Dressing: Maximal assistance Where Assessed-Lower Body Dressing: Bed level Toileting: Not assessed Toilet Transfer: Not assessed  Therapy/Group: Individual Therapy  Marcin Holte 02/20/2020, 12:42 PM

## 2020-02-21 ENCOUNTER — Inpatient Hospital Stay (HOSPITAL_COMMUNITY): Payer: BC Managed Care – PPO | Admitting: Speech Pathology

## 2020-02-21 ENCOUNTER — Inpatient Hospital Stay (HOSPITAL_COMMUNITY): Payer: BC Managed Care – PPO | Admitting: Occupational Therapy

## 2020-02-21 ENCOUNTER — Inpatient Hospital Stay (HOSPITAL_COMMUNITY): Payer: BC Managed Care – PPO | Admitting: Physical Therapy

## 2020-02-21 LAB — GLUCOSE, CAPILLARY
Glucose-Capillary: 69 mg/dL — ABNORMAL LOW (ref 70–99)
Glucose-Capillary: 87 mg/dL (ref 70–99)
Glucose-Capillary: 175 mg/dL — ABNORMAL HIGH (ref 70–99)
Glucose-Capillary: 161 mg/dL — ABNORMAL HIGH (ref 70–99)
Glucose-Capillary: 142 mg/dL — ABNORMAL HIGH (ref 70–99)

## 2020-02-21 MED ORDER — INSULIN DETEMIR 100 UNIT/ML ~~LOC~~ SOLN
22.0000 [IU] | Freq: Two times a day (BID) | SUBCUTANEOUS | Status: DC
  Administered 2020-02-21 – 2020-02-23 (×4): 22 [IU] via SUBCUTANEOUS
  Filled 2020-02-21 (×5): qty 0.22

## 2020-02-21 NOTE — Progress Notes (Signed)
Speech Language Pathology Daily Session Note  Patient Details  Name: Nicholas [name] MRN: [id number] Date of Birth: [birth year]  Today's Date: 02/21/2020 SLP Individual Time: 7494-4967 SLP Individual Time Calculation (min): 41 min  Short Term Goals: Week 3: SLP Short Term Goal 1 (Week 3): Pt will sustain attention to functional tasks for 5-7 minute intervals with Max A cues. SLP Short Term Goal 2 (Week 3): Pt will recall new and daily information with Mod A for use of aids or strategies. SLP Short Term Goal 3 (Week 3): Pt will engage in functional tasks with Mod A for use of strategies for visual scanning and/or locating items on left side of environment. SLP Short Term Goal 4 (Week 3): Pt will demonstrate functional problem solving skills in basic familiar situations with Mod A multimodal cues. SLP Short Term Goal 5 (Week 3): Patient will follow 2 step directions with 80% accuracy and Mod A.  Skilled Therapeutic Interventions: Pt was seen for skilled ST targeting cognition. SLP facilitated session with a familiar basic money management task to assess carryover from yesterday's session. Pt demonstrated some improvements in ability to display set amounts of change after sorting them (ex: show me 35 cents) with Moderate verbal and visual cues for scanning, problem solving, and error awareness. However, Max A was required for working and short term memory both during hands on tasks and in functional conversation regarding therapy sessions. Pt with very limited recall of therapy disciplines and targeted activities outside of current session. He became frustrated and limited by back pain, requiring Max A verbal and visual cues for functional problem solving repositioning options. Pt left sitting in tilt in space chair with seatbelt alarm on, soft call bell in lap. Continue per current plan of care.        Pain Pain Assessment Pain Scale: Faces Pain Score: 0-No pain Faces Pain Scale: Hurts Nicholas  more Pain Type: Acute pain Pain Location: Back Pain Orientation: Lower Pain Descriptors / Indicators: Aching;Grimacing Pain Onset: On-going Pain Intervention(s): Distraction;Repositioned  Therapy/Group: Individual Therapy  Nicholas [name] 02/21/2020, 7:10 AM

## 2020-02-21 NOTE — Progress Notes (Signed)
Hypoglycemic Event  CBG: 69 mg/dL at 4081  Treatment: 4 oz juice administered  Symptoms: No s/sx of hypoglycemia observed.  Patient resting in bed in NAD  Follow-up CBG: Time: 0650   CBG Result: 87 mg/dL  Possible Reasons for Event: Levemir 25 units administered 02/20/20 @2130  as scheduled for CBG 162 mg/dL.  Bedtime snack administered.  Comments/MD notified: Hypoglycemic protocol followed per facility algorithm   , MSN, RN, CNL IP Rehab

## 2020-02-21 NOTE — Progress Notes (Signed)
Physical Therapy Session Note  Patient Details  Name: Nicholas [name] MRN: [id number] Date of Birth: May 28, 1951  Today's Date: 02/21/2020 PT Individual Time: 5247-9980 PT Individual Time Calculation (min): 77 min   Short Term Goals: Week 3:  PT Short Term Goal 1 (Week 3): Patient to be able to complete all supine<->sit transfers with maxAx1 and use of bed features PT Short Term Goal 2 (Week 3): Patient to be able to consistently perform sliding board transfers wtih Mod-MaxA of 1 and standby of second person for safety PT Short Term Goal 3 (Week 3): Patient to tolerate 3-4 minutes in standing frame PT Short Term Goal 4 (Week 3): Paitent to initiate gait training  Skilled Therapeutic Interventions/Progress Updates:    Patient received in TIS WC, willing to participate in session however spouse reports that he is wet- they called nursing to have him ready for session but were not expecting PT to show up early. Required ModA of 2 for SBT back to bed, and MaxAx2 for sit to supine, then able to roll side to side with MinA for doffing soiled brief and donning clean one; able to intermittently use L hand to help with pericare during brief change. Able to get from sidelying to sit with MaxA, then practiced multiple sliding board transfers with Mod-maxAx2 with great difficulty in sequencing and in managing L and anterior lean today- consistently required Mod-MaxAx2 for all transfers for safety today. Tolerated two rounds  In standing frame for 2 minutes 30 seconds on first attempt and 4 minutes on second attempt but with Max cues for upright posture due to anterior and L lean. Otherwise sat unsupported on edge of mat table and worked on trunk rotation and elongation to the right reaching for cone cross-midline and reaching anteriorly to hand cone to second helper with max-totalA to manage weight shifting and trunk mechanics. Very fatigued at EOS, and left up in [facility] with all needs met, soon [name] present and  providing direct supervision.   Therapy Documentation Precautions:  Precautions Precautions: None Precaution Comments: Lt hemiparesis with Lt neglect ; watch BP Restrictions Weight Bearing Restrictions: No Other Position/Activity Restrictions: keep BP >160 Pain: Pain Assessment Pain Scale: Faces Pain Score: 8  Faces Pain Scale: Hurts little more Pain Type: Acute pain Pain Location: Back Pain Orientation: Lower;Mid Pain Descriptors / Indicators: Aching;Discomfort Pain Onset: On-going Patients Stated Pain Goal: 0 Pain Intervention(s): Distraction;Ambulation/increased activity;Repositioned    Therapy/Group: Individual Therapy   Windell Norfolk, DPT, PN1   Supplemental Physical Therapist [place]    Pager [phone] Acute Rehab Office [phone]    02/21/2020, 3:39 PM

## 2020-02-21 NOTE — Progress Notes (Signed)
Physical Therapy Session Note  Patient Details  Name: Nicholas [name] MRN: [id number] Date of Birth: May 07, 1951  Today's Date: 02/21/2020 PT Individual Time: 0920-0947 PT Individual Time Calculation (min): 27 min   Short Term Goals: Week 3:  PT Short Term Goal 1 (Week 3): Patient to be able to complete all supine<->sit transfers with maxAx1 and use of bed features PT Short Term Goal 2 (Week 3): Patient to be able to consistently perform sliding board transfers wtih Mod-MaxA of 1 and standby of second person for safety PT Short Term Goal 3 (Week 3): Patient to tolerate 3-4 minutes in standing frame PT Short Term Goal 4 (Week 3): Paitent to initiate gait training  Skilled Therapeutic Interventions/Progress Updates:     Patient received today in bed, willing to participate in therapy; able to get from supine to sit with ModA, then continues to require 2 helpers (Min-ModA) for sliding board transfer to chair with Mod cues to bring head forward and for sequencing of scooting across the board. Otherwise worked on anterior lean/translation reaching for cones in midline direction to help improve transfers, able to participate but required mod cues and Min-ModA for reaching without lean to L. Left up in TIS WC with all needs met, seatbelt alarm active this morning.   Therapy Documentation Precautions:  Precautions Precautions: None Precaution Comments: Lt hemiparesis with Lt neglect ; watch BP Restrictions Weight Bearing Restrictions: No Other Position/Activity Restrictions: keep BP >160 Pain: Pain Assessment Pain Scale: Faces Pain Score: 0-No pain Faces Pain Scale: No hurt Pain Type: Acute pain Pain Location: Back Pain Orientation: Lower Pain Descriptors / Indicators: Aching;Grimacing Pain Onset: On-going Pain Intervention(s): Distraction;Repositioned    Therapy/Group: Individual Therapy   Windell Norfolk, DPT, PN1   Supplemental Physical Therapist [place]    Pager  [phone] Acute Rehab Office [phone]    02/21/2020, 12:24 PM

## 2020-02-21 NOTE — Patient Care Conference (Deleted)
Inpatient RehabilitationTeam Conference and Plan of Care Update Date: 02/21/2020   Time: 11:35 AM    Patient Name: Nicholas [name]      Medical Record Number: [id number]  Date of Birth: 02-04-1951 Sex: Male         Room/Bed: 4W01C/4W01C-01 Payor Info: Payor: BLUE CROSS BLUE SHIELD / Plan: BCBS COMM PPO / Product Type: *No Product type* /    Admit Date/Time:  02/02/2020  3:33 PM  Primary Diagnosis:  SDH (subdural hematoma) ([place])  Patient Active Problem List   Diagnosis Date Noted  . Seizure prophylaxis   . Spastic hemiparesis ([place])   . Labile blood pressure   . Labile blood glucose   . Pressure injury of skin 02/05/2020  . Orthostasis   . Acute blood loss anemia   . Hypertensive crisis   . Hyponatremia   . SDH (subdural hematoma) ([place])   . Transaminitis   . Wide-complex tachycardia ([place])   . Intracranial vasospasm   . Essential hypertension   . Controlled type 2 diabetes mellitus with hyperglycemia ([place])   . Hepatitis   . Abdominal distention   . NSVT (nonsustained ventricular tachycardia) ([place])   . Rate-related bundle branch block   . Acute hypoxemic respiratory failure ([place])   . Aneurysm ([place])   . SAH (subarachnoid hemorrhage) ([place]) 01/14/2020  . Other and unspecified hyperlipidemia 07/17/2013  . Need for prophylactic vaccination and inoculation against influenza 07/17/2013  . Type II or unspecified type diabetes mellitus without mention of complication, not stated as uncontrolled 04/12/2013  . 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 Elevated transaminase measurement 04/12/2013  . Type II or unspecified type diabetes mellitus without mention of complication, uncontrolled 01/05/2013  . Essential hypertension, benign 01/05/2013  . BPH (benign prostatic hyperplasia) 01/05/2013  . Overweight(278.02) 01/05/2013    Expected Discharge Date: Expected Discharge Date: 03/04/20  Team Members Present: Physician leading conference: Dr. Delice Lesch Care Coodinator Present: Nestor Lewandowsky, RN,  BSN, CRRN;Christina Sampson Goon, BSW Nurse Present: Rayetta Pigg, RN PT Present: Deniece Ree, PT OT Present: Meriel Pica, OT SLP Present: Jettie Booze, CF-SLP PPS Coordinator present : Ileana Ladd, Burna Mortimer, SLP     Current Status/Progress Goal Weekly Team Focus  Bowel/Bladder   Patient is incontinent of bowel and bladder. LBM 02/19/20  Patient will regain bowel and bladder continence  Assess toileting needs Qshift and PRN. Implement timed toileting and strict pericare to maintain skin integrity.   Swallow/Nutrition/ Hydration             ADL's   improved attention, initiation, motor planning overall.  progressing with pt's active participation in self care (mod A UB, max -total LB bed level), overall slow but stedy progress from last week  min A toilet transfer, dressing, bathing; mod A toileting and shower transfer (goals may need to be downgraded to mod -max A)  postural training, balance, L side awareness, LUE NMR, visual scanning, cognition, ADL training   Mobility   improved attention and participation and coming along nicely! Still needs 2 helpers but level of assist varies from Min-max depending on task and fatigue. Standing in standing frame 2-3 minutes. Sliding board transfers getting much better.  mod-maxA, WC with S  bed mobility, transfers ,standing and OOB tolerance, pre-gait as able   Communication   Min-Mod verbal initiation depending on fatigue  Min  overall verbal output and engagement   Safety/Cognition/ Behavioral Observations  Mod-Max  Min  initiation, sustained attention (improving), functional problem solving, recall, error awareness  Pain   Patient's pain is well managed PRN meds.  No c/o or s/sx of pain overnight.  Patient will continue with satisfactory pain levels less than 3.  Assess pain Qshift and PRN. Anticipate need for pain management with movement and care.   Skin   Patient has healed stage 1 to sacrum  Maintain skin healing and integrity  with peri care, barrier creams and foam dressing. Encourage balanced nutrition.  Assess skin Qshift and PRN. Continue frequent turns and repositions; strict peri care.    Rehab Goals Patient on target to meet rehab goals: Yes Rehab Goals Revised: progressing with current goals *See Care Plan and progress notes for long and short-term goals.     Barriers to Discharge  Current Status/Progress Possible Resolutions Date Resolved   Nursing                  PT  Home environment access/layout;Behavior;Inaccessible home environment;Incontinence  overall still very heavy levels of physical assist; delayed procesisng itme and reduced cognition              OT                  SLP                Care Coordinator Medical stability Spouse plans to d/c patient home. Family will need extensive educations on target          Discharge Planning/Teaching Needs:  Patient plans to discharge home with spouse and sons to provide care  Will schedule if recommended.   Team Discussion:  Pain improved with tylenol. Redness to left heel noted = prevalon boot recommended. Anxiety limiting progress, occasional left knee buckling, Continue with min assist goals with discharge planned to ALF.   Revisions to Treatment Plan:  None    Medical Summary Current Status: Left-sided weakness secondary to left greater than rightSDH and  SAH.  Status post cerebral angiogram balloon angioplasty Weekly Focus/Goal: Improve mobility, BP, CBGs, cognition  Barriers to Discharge: Behavior;Medical stability;Incontinence   Possible Resolutions to Barriers: Therapies, follow labs, optimize BP/DM meds   Continued Need for Acute Rehabilitation Level of Care: The patient requires daily medical management by a physician with specialized training in physical medicine and rehabilitation for the following reasons: Direction of a multidisciplinary physical rehabilitation program to maximize functional independence : Yes Medical  management of patient stability for increased activity during participation in an intensive rehabilitation regime.: Yes Analysis of laboratory values and/or radiology reports with any subsequent need for medication adjustment and/or medical intervention. : Yes   I attest that I was present, lead the team conference, and concur with the assessment and plan of the team.   Chana Bode B 02/21/2020, 11:35 AM

## 2020-02-21 NOTE — Progress Notes (Signed)
[facility] PHYSICAL MEDICINE & REHABILITATION PROGRESS NOTE   Subjective/Complaints: Patient seen laying in bed this morning.  He states he slept well overnight.  He states he "feels like he has been through the trenches".  When asked to elaborate or for specifics, he is unable to verbalize.  ROS: Denies neck pain, CP, SOB, N/V/D  Objective:   No results found. Recent Labs    02/20/20 0747  WBC 4.0  HGB 11.0*  HCT 33.8*  PLT 239   No results for input(s): NA, K, CL, CO2, GLUCOSE, BUN, CREATININE, CALCIUM in the last 72 hours.  Intake/Output Summary (Last 24 hours) at 02/21/2020 0842 Last data filed at 02/20/2020 1300 Gross per 24 hour  Intake 118 ml  Output --  Net 118 ml     Physical Exam: Vital Signs Blood pressure (!) 149/91, pulse 84, temperature 98.2 F (36.8 C), temperature source Oral, resp. rate 18, height 6' (1.829 m), weight 89.8 kg, SpO2 100 %. Constitutional: No distress . Vital signs reviewed. HENT: Normocephalic.  Atraumatic. Eyes: EOMI. No discharge. Cardiovascular: No JVD. Respiratory: Normal effort.  No stridor. GI: Non-distended. Skin: Warm and dry.  Intact. Psych: Normal mood.  Normal behavior. Musc: No edema in extremities.  No tenderness in extremities. Neuro: Alert Motor: Motor: LUE: 4 -/5 proximal distal with apraxia, stable LLE: 1+-2-/5 proximal distal, unchanged Tone difficult to assess due to patient resistance  Assessment/Plan: 1. Functional deficits secondary to [facility] which require 3+ hours per day of interdisciplinary therapy in a comprehensive inpatient rehab setting.  Physiatrist is providing close team supervision and 24 hour management of active medical problems listed below.  Physiatrist and rehab team continue to assess barriers to discharge/monitor patient progress toward functional and medical goals  Care Tool:  Bathing    Body parts bathed by patient: Abdomen, Front perineal area, Left upper leg, Face   Body parts bathed by  helper: Right arm, Left arm, Chest, Buttocks, Right lower leg, Left lower leg     Bathing assist Assist Level: Total Assistance - Patient < 25%     Upper Body Dressing/Undressing Upper body dressing   What is the patient wearing?: Pull over shirt    Upper body assist Assist Level: Moderate Assistance - Patient 50 - 74%    Lower Body Dressing/Undressing Lower body dressing      What is the patient wearing?: Pants     Lower body assist Assist for lower body dressing: Maximal Assistance - Patient 25 - 49%     Toileting Toileting    Toileting assist Assist for toileting: 2 Helpers(using Stedy + BSC)     Transfers Chair/bed transfer  Transfers assist     Chair/bed transfer assist level: 2 Helpers     Locomotion Ambulation   Ambulation assist   Ambulation activity did not occur: Safety/medical concerns          Walk 10 feet activity   Assist  Walk 10 feet activity did not occur: Safety/medical concerns        Walk 50 feet activity   Assist Walk 50 feet with 2 turns activity did not occur: Safety/medical concerns         Walk 150 feet activity   Assist Walk 150 feet activity did not occur: Safety/medical concerns         Walk 10 feet on uneven surface  activity   Assist Walk 10 feet on uneven surfaces activity did not occur: Safety/medical concerns  Wheelchair     Assist Will patient use wheelchair at discharge?: Yes Type of Wheelchair: Manual    Wheelchair assist level: Dependent - Patient 0% Max wheelchair distance: 150 ft    Wheelchair 50 feet with 2 turns activity    Assist        Assist Level: Dependent - Patient 0%   Wheelchair 150 feet activity     Assist      Assist Level: Dependent - Patient 0%    Medical Problem List and Plan: 1.  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  Status post cerebral angiogram balloon angioplasty  Continue CIR, no longer  15/7  Bilateral PRAFO 2.  Antithrombotics: -DVT/anticoagulation: Lovenox initiated 01/29/2020             -antiplatelet therapy: Aspirin 325 mg daily 3. Pain Management: Oxycodone as needed, Robaxin as needed. Heating pad to relax tight paraspinals of cervical and lumbar spine  Changed Oxycodone to Norco 7.5 mg/325 mg q4 hours per son request since been so sedated.  Muscle rub ordered on 5/27, scheduled may be used for neck low back and buttock area  Controlled on 6/3 4. Mood: Provide emotional support             -antipsychotic agents: N/A  Zoloft daily.   Amantadine 100 with breakfast and lunch started on 5/26 after discussion with therapies, improved after discussion with therapies. 5. Neuropsych: This patient is not capable of making decisions on his own behalf. 6. Skin/Wound Care: Routine skin checks 7. Fluids/Electrolytes/Nutrition: Routine in and outs   Carb modified diet 8.  Diabetes mellitus with hyperglycemia.  Hemoglobin A1c 7.7.  Check blood sugars before meals and at bedtime.  Patient on Tresiba 35 units daily, Glucotrol 10 mg daily, Actos 45 mg daily, Janumet 50-1000 mg twice daily, NovoLog 75 units daily prior to admission.               Levemir 31U BID, decreased to 28 twice daily on 5/26, decreased to 25 BID on 5/28, decreased to 22 on 6/3  NovoLog 3 units 3 times daily started on 5/26, increased to 5 TID on 5/28  Labile on 6/3 CBG (last 3)  Recent Labs    02/20/20 2125 02/21/20 0620 02/21/20 0650  GLUCAP 162* 69* 87   9.  Seizure prophylaxis. Keppra 500 mg twice daily, decreased to 250 twice daily on 5/25, d/ced on 5/28 10.  Arrhythmia/wide-complex tachycardia.  Amiodarone 200 mg daily x4 weeks (~6/12).  Follow-up cardiology services 11.  Hypertension with orthostasis.  Autoregulate on Nimotop.  Elevated at rest, however orthostasis noted by therapies, will avoid overcorrection   TEDS and abdominal binder as needed   Slightly labile on 6/3 12.  Hyperlipidemia.   Crestor/fenofibrate 13. Mild vasospasm evident on 5/14 vascular transcranial doppler. 14. Spasticity:  Started baclofen 5mg  TID for pain relief and spasticity, d/ced on 6/2 15.  Hyponatremia: Resolved  Sodium 139 on 5/28  Continue to monitor 16. Transaminitis: Resolved  LFTs WNL on 5/28  Continue to monitor 17.  Acute blood loss anemia  Hemoglobin 11.0 on 6/3  Continue to monitor  LOS: 19 days A FACE TO FACE EVALUATION WAS PERFORMED  Arlie Riker 8/3 02/21/2020, 8:42 AM

## 2020-02-21 NOTE — Progress Notes (Signed)
Occupational Therapy Session Note  Patient Details  Name: Nicholas [name] MRN: [id number] Date of Birth: 1951-03-27  Today's Date: 02/21/2020 OT Individual Time: 0830-0920 OT Individual Time Calculation (min): 50 min    Short Term Goals: Week 3:  OT Short Term Goal 1 (Week 3): Pt will be able to maintain his balance at EOB with S while completing UB bathing to demonstrate improved postural control. OT Short Term Goal 2 (Week 3): Pt will be able to don a shirt with min A. OT Short Term Goal 3 (Week 3): Pt will be able to push from sit to stand with max A of 1 to prepare for clothing management over hips. OT Short Term Goal 4 (Week 3): Pt will be able to use slide board with mod A of 1 and 2nd person to stabilize the board.  Skilled Therapeutic Interventions/Progress Updates:    Pt received in bed awake and alert.  Discussed with pt that we did not have physical A from a rehab tech or his son today, so I really need him to help me as much as possible. Pt seemed to respond well as overall he was initiating extremely well, putting in more physical effort and attending to each task for longer.    From supine, pt needed wet brief doffed, cleansed and new brief donned. He cleansed himself and even tried to adjust new brief under himself as he was bridging his hips. Used figure 4 position to don pant over L foot with min A and then he placed R foot in pants with therapist initially holding pants up for him. He used B hands to pull pants over hips 90% of the way with bridging.  Great follow through with task completion.  Donned shirt mod A with HOB elevated.  In supine, Worked on LUE/LLE motor control and strength: - with bridges, hip abd with theraband, hip add with pillow squeezes -ankle mobility with rocking from heels to toes (limited plantar flexion strength on L) -  core strength with knee rocks side to side, ab curls with reaching hands from thighs to knees -theraband in each hand for over head  reaches and sh horizontal abduction  With all exercises, good initiation and focus but continues to need mod cues to fully attend to L side and min to mod physical support with L extremities to fully execute movement patterns.    PT arrived for her session and we worked together to transfer pt to [employer] via [employer] focusing on forward wt shift and trunk control.    Pt with PT for her session.   Therapy Documentation Precautions:  Precautions Precautions: None Precaution Comments: Lt hemiparesis with Lt neglect ; watch BP Restrictions Weight Bearing Restrictions: No Other Position/Activity Restrictions: keep BP >160    Vital Signs: Therapy Vitals Temp: 98.2 F (36.8 C) Temp Source: Oral Pulse Rate: 84 Resp: 18 BP: (!) 149/91 Patient Position (if appropriate): Lying Oxygen Therapy SpO2: 100 % O2 Device: Room Air Pain: Pain Assessment Pain Scale: 0-10 Pain Score: 0-No pain ADL: ADL Eating: Maximal assistance Grooming: Minimal assistance Where Assessed-Grooming: Sitting at sink Upper Body Bathing: Minimal cueing, Minimal assistance Where Assessed-Upper Body Bathing: Edge of bed Lower Body Bathing: Maximal assistance Where Assessed-Lower Body Bathing: Bed level Upper Body Dressing: Moderate assistance Where Assessed-Upper Body Dressing: Edge of bed Lower Body Dressing: Maximal assistance Where Assessed-Lower Body Dressing: Bed level Toileting: Not assessed Toilet Transfer: Not assessed Gaffer Transfer: Not assessed Social research officer, government Method: Other (  comment)(roll in shower chair) Astronomer: Other (comment)   Therapy/Group: Individual Therapy  Cyriah Childrey 02/21/2020, 9:45 AM

## 2020-02-22 ENCOUNTER — Inpatient Hospital Stay (HOSPITAL_COMMUNITY): Payer: BC Managed Care – PPO | Admitting: Occupational Therapy

## 2020-02-22 ENCOUNTER — Inpatient Hospital Stay (HOSPITAL_COMMUNITY): Payer: BC Managed Care – PPO | Admitting: Speech Pathology

## 2020-02-22 ENCOUNTER — Inpatient Hospital Stay (HOSPITAL_COMMUNITY): Payer: BC Managed Care – PPO | Admitting: Physical Therapy

## 2020-02-22 DIAGNOSIS — I10 Essential (primary) hypertension: Secondary | ICD-10-CM

## 2020-02-22 LAB — GLUCOSE, CAPILLARY
Glucose-Capillary: 94 mg/dL (ref 70–99)
Glucose-Capillary: 134 mg/dL — ABNORMAL HIGH (ref 70–99)
Glucose-Capillary: 181 mg/dL — ABNORMAL HIGH (ref 70–99)
Glucose-Capillary: 230 mg/dL — ABNORMAL HIGH (ref 70–99)

## 2020-02-22 NOTE — Progress Notes (Signed)
Occupational Therapy Session Note  Patient Details  Name: Nicholas [name] MRN: [id number] Date of Birth: February 19, 1951  Today's Date: 02/22/2020 OT Individual Time: 0830-0900 OT Individual Time Calculation (min): 30 min    Short Term Goals: Week 3:  OT Short Term Goal 1 (Week 3): Pt will be able to maintain his balance at EOB with S while completing UB bathing to demonstrate improved postural control. OT Short Term Goal 2 (Week 3): Pt will be able to don a shirt with min A. OT Short Term Goal 3 (Week 3): Pt will be able to push from sit to stand with max A of 1 to prepare for clothing management over hips. OT Short Term Goal 4 (Week 3): Pt will be able to use slide board with mod A of 1 and 2nd person to stabilize the board.  Skilled Therapeutic Interventions/Progress Updates:    Pt received in bed sound asleep but did wake easily.  Pt seen this session to facilitate motor planning, L side awareness, processing with dressing from bed level.  Pt had his brief changed so he could self cleanse his front perineal area with cues.  When new brief was being donned, pt made some interesting comments that did not make sense such as "the machine will tell me if it is in the crack".  When asked what he meant by machine, pt tried several times to unfasten brief and remove it as I was adjusting it around his hips.  Pt repeated several times, "well I want the machine to tell me".  I reassured him the brief was covering him well and hand pt feel around his bottom so he tell it was on him.   With donning pants and shirt, improved focus and initiation. Used figure 4 to don over L foot with min A and then he placed R foot in with therapist holding pants.  Used bridging in which pt pulled pants completely over his hips!  He then used B hands on head rail to pull himself up in bed with mod cues and min A.   Donned shirt with mod A and great attention to task demonstrating faster processing.   Pt resting in bed with RN in  room with him.   Therapy Documentation Precautions:  Precautions Precautions: None Precaution Comments: Lt hemiparesis with Lt neglect ; watch BP Restrictions Weight Bearing Restrictions: No Other Position/Activity Restrictions: keep BP >160  Pain: Pain Assessment Pain Score: 0-No pain ADL: ADL Eating: Maximal assistance Grooming: Minimal assistance Where Assessed-Grooming: Sitting at sink Upper Body Bathing: Minimal cueing, Minimal assistance Where Assessed-Upper Body Bathing: Edge of bed Lower Body Bathing: Maximal assistance Where Assessed-Lower Body Bathing: Bed level Upper Body Dressing: Moderate assistance Where Assessed-Upper Body Dressing: Edge of bed Lower Body Dressing: Maximal assistance Where Assessed-Lower Body Dressing: Bed level Toileting: Not assessed Toilet Transfer: Not assessed Psychologist, counselling Transfer: Not assessed Film/video editor Method: Other (comment)(roll in shower chair) Astronomer: Other (comment)  Therapy/Group: Individual Therapy  Eveleigh Crumpler 02/22/2020, 10:19 AM

## 2020-02-22 NOTE — Progress Notes (Signed)
Physical Therapy Session Note  Patient Details  Name: Nicholas [name] MRN: [id number] Date of Birth: 1951-07-26  Today's Date: 02/22/2020 PT Individual Time: 0915-0957 PT Individual Time Calculation (min): 42 min   Short Term Goals: Week 3:  PT Short Term Goal 1 (Week 3): Patient to be able to complete all supine<->sit transfers with maxAx1 and use of bed features PT Short Term Goal 2 (Week 3): Patient to be able to consistently perform sliding board transfers wtih Mod-MaxA of 1 and standby of second person for safety PT Short Term Goal 3 (Week 3): Patient to tolerate 3-4 minutes in standing frame PT Short Term Goal 4 (Week 3): Paitent to initiate gait training  Skilled Therapeutic Interventions/Progress Updates:    Patient received in bed, very confused this morning and with great difficulty following commands- required multiple simple verbal and tactile cues at Max level and even then only accurately followed commands approximately 30% of the time this morning. Had heavy, heavy posterior and L lean needing ModA to maintain upright sitting and required totalAx2 on first attempt at sliding board transfers due to leaning back and almost sliding himself off of the board however all other SBT were performed at ModAx2 today. Otherwise focused on sitting balance and dynamic reaching to promote anterior lean/translation of head as well as trunk rotation and lateral weight shifting with cross midline reaching and max faclitation from therapist. Reactive/protective responses absent today when support was removed in unsupported sitting. Left up in TIS WC with all needs met, seatbelt and alarm belt active this morning.   Therapy Documentation Precautions:  Precautions Precautions: None Precaution Comments: Lt hemiparesis with Lt neglect ; watch BP Restrictions Weight Bearing Restrictions: No Other Position/Activity Restrictions: keep BP >160   Pain: Pain Assessment Pain Scale: Faces Pain Score: 0-No  pain Faces Pain Scale: Hurts little more Pain Type: Chronic pain Pain Location: Back Pain Orientation: Lower;Mid Pain Descriptors / Indicators: Aching;Discomfort Pain Onset: On-going Patients Stated Pain Goal: 0 Pain Intervention(s): Repositioned    Therapy/Group: Individual Therapy   Windell Norfolk, DPT, PN1   Supplemental Physical Therapist [place]    Pager [phone] Acute Rehab Office [phone]    02/22/2020, 10:41 AM

## 2020-02-22 NOTE — Progress Notes (Signed)
[facility] PHYSICAL MEDICINE & REHABILITATION PROGRESS NOTE   Subjective/Complaints: Patient seen laying in bed this morning.  States he slept well overnight.  He had an incontinent bowel episode this AM.   ROS: Denies CP, SOB, N/V/D  Objective:   No results found. Recent Labs    02/20/20 0747  WBC 4.0  HGB 11.0*  HCT 33.8*  PLT 239   No results for input(s): NA, K, CL, CO2, GLUCOSE, BUN, CREATININE, CALCIUM in the last 72 hours.  Intake/Output Summary (Last 24 hours) at 02/22/2020 0901 Last data filed at 02/22/2020 0717 Gross per 24 hour  Intake 480 ml  Output --  Net 480 ml     Physical Exam: Vital Signs Blood pressure (!) 143/87, pulse 77, temperature 97.6 F (36.4 C), temperature source Oral, resp. rate 18, height 6' (1.829 m), weight 89.8 kg, SpO2 98 %. Constitutional: No distress . Vital signs reviewed. HENT: Normocephalic.  Atraumatic. Eyes: EOMI. No discharge. Cardiovascular: No JVD. Respiratory: Normal effort.  No stridor. GI: Non-distended. Skin: Warm and dry.  Intact. Psych: Normal mood.  Normal behavior. Musc: No edema in extremities.  No tenderness in extremities. Neuro: Alert Motor: Motor: LUE: 4 -/5 proximal distal with apraxia, stable LLE: 1+-2-/5 proximal distal, stable Tone difficult to assess due to patient resistance  Assessment/Plan: 1. Functional deficits secondary to [facility] which require 3+ hours per day of interdisciplinary therapy in a comprehensive inpatient rehab setting.  Physiatrist is providing close team supervision and 24 hour management of active medical problems listed below.  Physiatrist and rehab team continue to assess barriers to discharge/monitor patient progress toward functional and medical goals  Care Tool:  Bathing    Body parts bathed by patient: Abdomen, Front perineal area, Left upper leg, Face   Body parts bathed by helper: Right arm, Left arm, Chest, Buttocks, Right lower leg, Left lower leg     Bathing assist  Assist Level: Total Assistance - Patient < 25%     Upper Body Dressing/Undressing Upper body dressing   What is the patient wearing?: Pull over shirt    Upper body assist Assist Level: Moderate Assistance - Patient 50 - 74%    Lower Body Dressing/Undressing Lower body dressing      What is the patient wearing?: Pants     Lower body assist Assist for lower body dressing: Moderate Assistance - Patient 50 - 74%     Toileting Toileting    Toileting assist Assist for toileting: 2 Helpers(using Stedy + BSC)     Transfers Chair/bed transfer  Transfers assist     Chair/bed transfer assist level: 2 Helpers     Locomotion Ambulation   Ambulation assist   Ambulation activity did not occur: Safety/medical concerns          Walk 10 feet activity   Assist  Walk 10 feet activity did not occur: Safety/medical concerns        Walk 50 feet activity   Assist Walk 50 feet with 2 turns activity did not occur: Safety/medical concerns         Walk 150 feet activity   Assist Walk 150 feet activity did not occur: Safety/medical concerns         Walk 10 feet on uneven surface  activity   Assist Walk 10 feet on uneven surfaces activity did not occur: Safety/medical concerns         Wheelchair     Assist Will patient use wheelchair at discharge?: Yes Type of Wheelchair: Manual  Wheelchair assist level: Dependent - Patient 0% Max wheelchair distance: 150 ft    Wheelchair 50 feet with 2 turns activity    Assist        Assist Level: Dependent - Patient 0%   Wheelchair 150 feet activity     Assist      Assist Level: Dependent - Patient 0%    Medical Problem List and Plan: 1.  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  Status post cerebral angiogram balloon angioplasty  Continue CIR  Bilateral PRAFO 2.  Antithrombotics: -DVT/anticoagulation: Lovenox initiated 01/29/2020             -antiplatelet  therapy: Aspirin 325 mg daily 3. Pain Management: Oxycodone as needed, Robaxin as needed. Heating pad to relax tight paraspinals of cervical and lumbar spine  Changed Oxycodone to Norco 7.5 mg/325 mg q4 hours per son request since been so sedated.  Muscle rub ordered on 5/27, scheduled may be used for neck low back and buttock area  Controlled on 6/4 4. Mood: Provide emotional support             -antipsychotic agents: N/A  Zoloft daily.   Amantadine 100 with breakfast and lunch started on 5/26 after discussion with therapies, improved after discussion with therapies. 5. Neuropsych: This patient is not capable of making decisions on his own behalf. 6. Skin/Wound Care: Routine skin checks 7. Fluids/Electrolytes/Nutrition: Routine in and outs   Carb modified diet 8.  Diabetes mellitus with hyperglycemia.  Hemoglobin A1c 7.7.  Check blood sugars before meals and at bedtime.  Patient on Tresiba 35 units daily, Glucotrol 10 mg daily, Actos 45 mg daily, Janumet 50-1000 mg twice daily, NovoLog 75 units daily prior to admission.               Levemir 31U BID, decreased to 28 twice daily on 5/26, decreased to 25 BID on 5/28, decreased to 22 on 6/3  NovoLog 3 units 3 times daily started on 5/26, increased to 5 TID on 5/28  Labile on 6/4 CBG (last 3)  Recent Labs    02/21/20 1724 02/21/20 2127 02/22/20 0620  GLUCAP 161* 142* 94   9.  Seizure prophylaxis. Keppra 500 mg twice daily, decreased to 250 twice daily on 5/25, d/ced on 5/28 10.  Arrhythmia/wide-complex tachycardia.  Amiodarone 200 mg daily x4 weeks (~6/12).  Follow-up cardiology services 11.  Hypertension with orthostasis.  Autoregulate on Nimotop.  Elevated at rest, however orthostasis noted by therapies, will avoid overcorrection   TEDS and abdominal binder as needed   Relatively controlled on 6/4 12.  Hyperlipidemia.  Crestor/fenofibrate 13. Mild vasospasm evident on 5/14 vascular transcranial doppler. 14. Spasticity:  Started  baclofen 5mg  TID for pain relief and spasticity, d/ced on 6/2 15.  Hyponatremia: Resolved  Sodium 139 on 5/28  Continue to monitor 16. Transaminitis: Resolved  LFTs WNL on 5/28  Continue to monitor 17.  Acute blood loss anemia  Hemoglobin 11.0 on 6/3, labs ordered for Monday  Continue to monitor  LOS: 20 days A FACE TO FACE EVALUATION WAS PERFORMED  Usha Slager Lorie Phenix 02/22/2020, 9:01 AM

## 2020-02-22 NOTE — Progress Notes (Signed)
Speech Language Pathology Daily Session Note  Patient Details  Name: Nicholas [name] MRN: [id number] Date of Birth: 06-05-1951  Today's Date: 02/22/2020 SLP Individual Time: 1500-1525 SLP Individual Time Calculation (min): 25 min  Short Term Goals: Week 3: SLP Short Term Goal 1 (Week 3): Pt will sustain attention to functional tasks for 5-7 minute intervals with Max A cues. SLP Short Term Goal 2 (Week 3): Pt will recall new and daily information with Mod A for use of aids or strategies. SLP Short Term Goal 3 (Week 3): Pt will engage in functional tasks with Mod A for use of strategies for visual scanning and/or locating items on left side of environment. SLP Short Term Goal 4 (Week 3): Pt will demonstrate functional problem solving skills in basic familiar situations with Mod A multimodal cues. SLP Short Term Goal 5 (Week 3): Patient will follow 2 step directions with 80% accuracy and Mod A.  Skilled Therapeutic Interventions:  Pt was seen for skilled ST targeting cognitive goals.  Pt needed max assist to reorient to today's date using the calendar posted in his room.  SLP facilitated the session with a simple card game to address attention and visual scanning.  Pt attended to task for ~3-5 minute intervals with mod-max assist verbal cues for redirection.  Pt also benefited from mod assist verbal and visual cues to repeatedly locate his discard pile on his left side.  Pt was left in wheelchair with family at bedside and chair alarm set.  Continue per current plan of care.    Pain Pain Assessment Pain Scale: 0-10 Pain Score: 0-No pain  Therapy/Group: Individual Therapy  Auriana Scalia, Melanee Spry 02/22/2020, 4:15 PM

## 2020-02-23 ENCOUNTER — Inpatient Hospital Stay (HOSPITAL_COMMUNITY): Payer: BC Managed Care – PPO | Admitting: Occupational Therapy

## 2020-02-23 ENCOUNTER — Inpatient Hospital Stay (HOSPITAL_COMMUNITY): Payer: BC Managed Care – PPO | Admitting: Physical Therapy

## 2020-02-23 LAB — GLUCOSE, CAPILLARY
Glucose-Capillary: 70 mg/dL (ref 70–99)
Glucose-Capillary: 255 mg/dL — ABNORMAL HIGH (ref 70–99)
Glucose-Capillary: 176 mg/dL — ABNORMAL HIGH (ref 70–99)
Glucose-Capillary: 237 mg/dL — ABNORMAL HIGH (ref 70–99)

## 2020-02-23 MED ORDER — INSULIN DETEMIR 100 UNIT/ML ~~LOC~~ SOLN
18.0000 [IU] | Freq: Two times a day (BID) | SUBCUTANEOUS | Status: DC
  Administered 2020-02-23 – 2020-02-27 (×8): 18 [IU] via SUBCUTANEOUS
  Filled 2020-02-23 (×10): qty 0.18

## 2020-02-23 NOTE — Progress Notes (Signed)
Physical Therapy Session Note  Patient Details  Name: Nicholas [name] MRN: [id number] Date of Birth: [birth year]  Today's Date: 02/23/2020 PT Individual Time: 0807-0919 PT Individual Time Calculation (min): 72 min   Short Term Goals: Week 3:  PT Short Term Goal 1 (Week 3): Patient to be able to complete all supine<->sit transfers with maxAx1 and use of bed features PT Short Term Goal 2 (Week 3): Patient to be able to consistently perform sliding board transfers wtih Mod-MaxA of 1 and standby of second person for safety PT Short Term Goal 3 (Week 3): Patient to tolerate 3-4 minutes in standing frame PT Short Term Goal 4 (Week 3): Paitent to initiate gait training  Skilled Therapeutic Interventions/Progress Updates:    Pt received asleep, supine in bed and upon awakening agreeable to therapy session. Pt continues with slow/delayed verbal responses to questions but answers appropriately. In supine, donned pants max assist for threading LEs then rolling R/L in bed with max assist for total assist pulling pants over hips - max multimodal cuing for sequencing rolling. Donned shoes total assist. Supine>sitting R EOB via logroll for increased pt participation with max cuing for LE management off EOB and max/total assist for trunk upright. Sitting EOB pt demonstrates strong posterior lean and upon attempts at brining trunk forward pt pushes backwards - requires max multimodal cuing for improved midline orientation but continues to require max assist to maintain upright - donned shirt max assist. Returned to supine with max assist for trunk descent and LE management into the bed with max cuing for sequencing. Supine scoot towards HOB using bed features with max/total assist via bed pads. Returned to sitting R EOB via logroll technique with max assist as pt demonstrates improved ability to use UEs to push trunk upright. Once sitting EOB pt now able to maintain static sitting with min/mod assist. +2 assist arrived. L  lateral scoot transfer EOB>w/c using transfer board with therapist providing max cuing for R trunk lean onto forearm support with min/mod assist while +2 assist places board - performs scooting with max assist of 1 with significantly increased time and cuing for initiation and motor planning.  Transported to/from gym in w/c for time management and energy conservation. R lateral scoot to EOM using transfer board again with min/mod assist for trunk control and LE management while +2 assist placed transfer board then pt able to scoot with max assist of 1 with significantly increased time. Focused on sitting EOM trunk control and anterior weight shift - once EOM requires mod assist to stay upright due to posterior lean - had pt transition to performing seated UE task targeting anterior trunk lean and L UE NMR with pt requiring total assist to prevent anterior trunk LOB during task and max manual facilitation to prevent pushing with RUE - mirror feedback, multimodal cuing, and manual facilitation for midline orientation with pt requiring mod assist. L lateral scoot transfer back to w/c via transfer board again with mod assist of 1 for trunk control while +2 assist placed board then mod assist of 1 (+2 for safety) during scooting with max multimodal cuing and significantly increased time allowed for pt initiation. Transported back to room and left tilted back in TIS w/c with seat belt alarm on, needs in reach, and RN aware of pt's position.  Therapy Documentation Precautions:  Precautions Precautions: None Precaution Comments: Lt hemiparesis with Lt neglect ; watch BP Restrictions Weight Bearing Restrictions: No Other Position/Activity Restrictions: keep BP >160  Pain:  Towards end of session pt reports "I hurt all over" - RN notified.   Therapy/Group: Individual Therapy  Tawana Scale, PT, DPT 02/23/2020, 7:51 AM

## 2020-02-23 NOTE — Progress Notes (Signed)
Occupational Therapy Session Note  Patient Details  Name: Nicholas [name] MRN: [id number] Date of Birth: Nov 15, 1950  Today's Date: 02/23/2020 OT Individual Time: 1250-1400 OT Individual Time Calculation (min): 70 min    Short Term Goals: Week 1:  OT Short Term Goal 1 (Week 1): Pt will maintain static sitting balance (unsupported) with min A in prep for dyanamic sititng balance in ADL with OT Short Term Goal 1 - Progress (Week 1): Progressing toward goal OT Short Term Goal 2 (Week 1): Pt will don shirt with mod A OT Short Term Goal 2 - Progress (Week 1): Progressing toward goal OT Short Term Goal 3 (Week 1): Pt will perform transfer of max A +1 with LRAD method OT Short Term Goal 3 - Progress (Week 1): Progressing toward goal OT Short Term Goal 4 (Week 1): PT will perform bed mobility in prep for ADL tasks with max A+1 (rolling/ supine to sit) OT Short Term Goal 4 - Progress (Week 1): Met  Skilled Therapeutic Interventions/Progress Updates: session focus as follows:  Older son present for session, education and was supportive.  [name] in tilt back chair upon approach.  Much time spent with trunk rotattion, stretching in his left and right frontal planes ofmovements to help break up strong right lateral leans and pushing.    He required extra time to process and attempt to complete motor planning to do so.  Required max A to total A x2 to facilitate  Dynamic and static Sitting balance = moderate to total assist of 1 to 2 persons  He exhibited strong left neglect with his left field cut.   He required tactile and verbal cueing to engaged/attention to items in his left environment.  He toleratred left trapezius stretching as it was very tight, especially posteriorly behind left ear  He required constant cueing to open right eye for 90% of tasks and movements.  He completed sit to stand in [place] with max Ax2 and postured with a slightly squatted position, requiring input at bilateral glutes,  left hamstring, thoracic trunk area to attempt to stand upright and engage both lower extremities.  At end of session, he was left sitting in tilt chair, tilted back with his supportive son nearby.  Continue OT Plan of Care     Therapy Documentation Precautions:  Precautions Precautions: None Precaution Comments: Lt hemiparesis with Lt neglect ; watch BP Restrictions Weight Bearing Restrictions: No Other Position/Activity Restrictions: keep BP >160  Pain:denied   Therapy/Group: Individual Therapy  Alfredia Ferguson [facility] 02/23/2020, 3:19 PM

## 2020-02-23 NOTE — Progress Notes (Signed)
[facility] PHYSICAL MEDICINE & REHABILITATION PROGRESS NOTE   Subjective/Complaints: Patient seen laying in bed this morning.  He states he slept well overnight.  He states he "wants special pajamas to block out negative thoughts".  ROS: Denies CP, SOB, N/V/D  Objective:   No results found. No results for input(s): WBC, HGB, HCT, PLT in the last 72 hours. No results for input(s): NA, K, CL, CO2, GLUCOSE, BUN, CREATININE, CALCIUM in the last 72 hours.  Intake/Output Summary (Last 24 hours) at 02/23/2020 1327 Last data filed at 02/23/2020 1325 Gross per 24 hour  Intake 360 ml  Output 350 ml  Net 10 ml     Physical Exam: Vital Signs Blood pressure (!) 145/87, pulse 75, temperature 97.9 F (36.6 C), resp. rate 18, height 6' (1.829 m), weight 89.8 kg, SpO2 100 %. Constitutional: No distress . Vital signs reviewed. HENT: Normocephalic.  Atraumatic. Eyes: EOMI. No discharge. Cardiovascular: No JVD. Respiratory: Normal effort.  No stridor. GI: Non-distended. Skin: Warm and dry.  Intact. Psych: Slowed. Musc: No edema in extremities.  No tenderness in extremities. Neuro: Alert Motor: Motor: LUE: 4 -/5 proximal distal with apraxia, stable LLE: 3 -/5 proximal distal Tone difficult to assess due to patient resistance  Assessment/Plan: 1. Functional deficits secondary to [facility] which require 3+ hours per day of interdisciplinary therapy in a comprehensive inpatient rehab setting.  Physiatrist is providing close team supervision and 24 hour management of active medical problems listed below.  Physiatrist and rehab team continue to assess barriers to discharge/monitor patient progress toward functional and medical goals  Care Tool:  Bathing    Body parts bathed by patient: Abdomen, Front perineal area, Left upper leg, Face   Body parts bathed by helper: Right arm, Left arm, Chest, Buttocks, Right lower leg, Left lower leg     Bathing assist Assist Level: Total Assistance - Patient <  25%     Upper Body Dressing/Undressing Upper body dressing   What is the patient wearing?: Pull over shirt    Upper body assist Assist Level: Moderate Assistance - Patient 50 - 74%    Lower Body Dressing/Undressing Lower body dressing      What is the patient wearing?: Pants     Lower body assist Assist for lower body dressing: Moderate Assistance - Patient 50 - 74%     Toileting Toileting    Toileting assist Assist for toileting: 2 Helpers(using Stedy + BSC)     Transfers Chair/bed transfer  Transfers assist     Chair/bed transfer assist level: 2 Helpers     Locomotion Ambulation   Ambulation assist   Ambulation activity did not occur: Safety/medical concerns          Walk 10 feet activity   Assist  Walk 10 feet activity did not occur: Safety/medical concerns        Walk 50 feet activity   Assist Walk 50 feet with 2 turns activity did not occur: Safety/medical concerns         Walk 150 feet activity   Assist Walk 150 feet activity did not occur: Safety/medical concerns         Walk 10 feet on uneven surface  activity   Assist Walk 10 feet on uneven surfaces activity did not occur: Safety/medical concerns         Wheelchair     Assist Will patient use wheelchair at discharge?: Yes Type of Wheelchair: Manual    Wheelchair assist level: Dependent - Patient 0% Max  wheelchair distance: 150 ft    Wheelchair 50 feet with 2 turns activity    Assist        Assist Level: Dependent - Patient 0%   Wheelchair 150 feet activity     Assist      Assist Level: Dependent - Patient 0%    Medical Problem List and Plan: 1.  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  Status post cerebral angiogram balloon angioplasty  Continue CIR  Bilateral PRAFO 2.  Antithrombotics: -DVT/anticoagulation: Lovenox initiated 01/29/2020             -antiplatelet therapy: Aspirin 325 mg daily 3. Pain  Management: Oxycodone as needed, Robaxin as needed. Heating pad to relax tight paraspinals of cervical and lumbar spine  Changed Oxycodone to Norco 7.5 mg/325 mg q4 hours per son request since been so sedated.  Muscle rub ordered on 5/27, scheduled may be used for neck low back and buttock area  Controlled on 6/5 4. Mood: Provide emotional support             -antipsychotic agents: N/A  Zoloft daily.   Amantadine 100 with breakfast and lunch started on 5/26 after discussion with therapies, improved after discussion with therapies. 5. Neuropsych: This patient is not capable of making decisions on his own behalf. 6. Skin/Wound Care: Routine skin checks 7. Fluids/Electrolytes/Nutrition: Routine in and outs   BMP within acceptable range on 5/20, labs ordered for Monday  Carb modified diet 8.  Diabetes mellitus with hyperglycemia.  Hemoglobin A1c 7.7.  Check blood sugars before meals and at bedtime.  Patient on Tresiba 35 units daily, Glucotrol 10 mg daily, Actos 45 mg daily, Janumet 50-1000 mg twice daily, NovoLog 75 units daily prior to admission.               Levemir 31U BID, decreased to 28 twice daily on 5/26, decreased to 25 BID on 5/28, decreased to 22 on 6/3, decreased to 18 BID on 6/5  NovoLog 3 units 3 times daily started on 5/26, increased to 5 TID on 5/28, plan to increase tomorrow  Labile on 6/5 CBG (last 3)  Recent Labs    02/22/20 2115 02/23/20 0652 02/23/20 1133  GLUCAP 230* 70 255*   9.  Seizure prophylaxis. Keppra 500 mg twice daily, decreased to 250 twice daily on 5/25, d/ced on 5/28 10.  Arrhythmia/wide-complex tachycardia.  Amiodarone 200 mg daily x4 weeks (~6/12).  Follow-up cardiology services 11.  Hypertension with orthostasis.  Autoregulate on Nimotop.  Elevated at rest, however orthostasis noted by therapies, will avoid overcorrection   TEDS and abdominal binder as needed   Relatively controlled on 6/5 12.  Hyperlipidemia.  Crestor/fenofibrate 13. Mild vasospasm  evident on 5/14 vascular transcranial doppler. 14. Spasticity:  Started baclofen 5mg  TID for pain relief and spasticity, d/ced on 6/2 15.  Hyponatremia: Resolved  Sodium 139 on 5/28  Continue to monitor 16. Transaminitis: Resolved  LFTs WNL on 5/28  Continue to monitor 17.  Acute blood loss anemia  Hemoglobin 11.0 on 6/3, labs ordered for Monday  Continue to monitor  LOS: 21 days A FACE TO FACE EVALUATION WAS PERFORMED  Day Deery Lorie Phenix 02/23/2020, 1:27 PM

## 2020-02-24 ENCOUNTER — Inpatient Hospital Stay (HOSPITAL_COMMUNITY): Payer: BC Managed Care – PPO

## 2020-02-24 LAB — GLUCOSE, CAPILLARY
Glucose-Capillary: 114 mg/dL — ABNORMAL HIGH (ref 70–99)
Glucose-Capillary: 162 mg/dL — ABNORMAL HIGH (ref 70–99)
Glucose-Capillary: 86 mg/dL (ref 70–99)
Glucose-Capillary: 152 mg/dL — ABNORMAL HIGH (ref 70–99)

## 2020-02-24 MED ORDER — INSULIN ASPART 100 UNIT/ML ~~LOC~~ SOLN
8.0000 [IU] | Freq: Three times a day (TID) | SUBCUTANEOUS | Status: DC
  Administered 2020-02-24 – 2020-03-04 (×25): 8 [IU] via SUBCUTANEOUS

## 2020-02-24 NOTE — Plan of Care (Signed)
  Problem: Consults Goal: RH STROKE PATIENT EDUCATION Description: See Patient Education module for education specifics  Outcome: Progressing   Problem: RH BOWEL ELIMINATION Goal: RH STG MANAGE BOWEL W/MEDICATION W/ASSISTANCE Description: STG Manage Bowel with Medication with  Mod Assistance. Outcome: Progressing   Problem: RH BLADDER ELIMINATION Goal: RH STG MANAGE BLADDER WITH ASSISTANCE Description: STG Manage Bladder With Mod Assistance Outcome: Progressing Goal: RH STG MANAGE BLADDER WITH MEDICATION WITH ASSISTANCE Description: STG Manage Bladder With Medication With Mod I Assistance. Outcome: Progressing   Problem: RH SKIN INTEGRITY Goal: RH STG SKIN FREE OF INFECTION/BREAKDOWN Description: Mod I Outcome: Progressing   Problem: RH SAFETY Goal: RH STG ADHERE TO SAFETY PRECAUTIONS W/ASSISTANCE/DEVICE Description: STG Adhere to Safety Precautions With Min Assistance/Device. Outcome: Progressing Goal: RH STG DECREASED RISK OF FALL WITH ASSISTANCE Description: STG Decreased Risk of Fall With Min Assistance. Outcome: Progressing   Problem: RH COGNITION-NURSING Goal: RH STG ANTICIPATES NEEDS/CALLS FOR ASSIST W/ASSIST/CUES Description: STG Anticipates Needs/Calls for Assist With Min  Assistance/Cues. Outcome: Progressing   Problem: RH Vision Goal: RH LTG Vision (Specify) Outcome: Progressing   

## 2020-02-24 NOTE — Progress Notes (Signed)
Physical Therapy Session Note  Patient Details  Name: Nicholas [name] MRN: [id number] Date of Birth: 1951/01/06  Today's Date: 02/24/2020 PT Individual Time: 1017-1120 PT Individual Time Calculation (min): 63 min  Missing time 12 min. Short Term Goals: Week 3:  PT Short Term Goal 1 (Week 3): Patient to be able to complete all supine<->sit transfers with maxAx1 and use of bed features PT Short Term Goal 2 (Week 3): Patient to be able to consistently perform sliding board transfers wtih Mod-MaxA of 1 and standby of second person for safety PT Short Term Goal 3 (Week 3): Patient to tolerate 3-4 minutes in standing frame PT Short Term Goal 4 (Week 3): Paitent to initiate gait training  Skilled Therapeutic Interventions/Progress Updates: Pt presents asleep in bed, and requires arousing to agree to therapy.  Pt slow to respond, but does answer appropriately to questions.  Pt rolled side to side w/ min A using side rails and verbal cues for use of UEs.  Pt requires increased cueing for use of LUE.  Pt rolled to check brief prior to beginning.  Pt required max A for right side-lying to sit transfer to EOB.  Pt w/ strong push to left as well as retropulse.  Pt sat EOB x 5-6' to achieve midline sitting, w/o retropulse.  Pt sitting w/ hands on knees. Unable to place SB w/ 1 person.  Pt performed sit to stand w/ max to total A, but w/ flexed knees.  Pt able to straighten and performed step-pivot bed > w/c, total A.  Pt tilted back to slide hips to back.  Pt now w/ BM and transferred BTB w/ SB and assist of 2, but pt leaning forward and initiating transfer using LEs as well.  Pt required max A sit to supine and then performed rolling side to side w/ min A for total A w/ clothing management and pericare and donning new brief.  Threaded pants over feet w/ pt lifting LEs and then able to pull over knees and attempting bridging to pull over hips.  Pt rolled to complete pulling over hips.  Pt performed right side-lying > sit  transfer w/ mod A and verbal cues for sequencing.  Able to push up w/ right UE as LEs came off bed.  Pt sat EOB w/ improved balance.  Pt performed multiple down on elbows/FA to B sides w/ in A to maintain and then able to push up to midline, right > left.  Pt returned to supine 2/2 fatigue.  Bed alarm on and all needs in reach.     Therapy Documentation Precautions:  Precautions Precautions: None Precaution Comments: Lt hemiparesis with Lt neglect ; watch BP Restrictions Weight Bearing Restrictions: No Other Position/Activity Restrictions: keep BP >160 General: PT Amount of Missed Time (min): 12 Minutes PT Missed Treatment Reason: Patient fatigue Vital Signs:   Pain:does not appear to have any pain. Pain Assessment Pain Scale: Faces Faces Pain Scale: Hurts even more Pain Type: Acute pain Pain Location: Back    Therapy/Group: Individual Therapy  Lucio Edward 02/24/2020, 12:45 PM

## 2020-02-24 NOTE — Progress Notes (Signed)
Occupational Therapy Session Note  Patient Details  Name: Nicholas [name] MRN: [id number] Date of Birth: [birth year]  Today's Date: 02/24/2020 OT Individual Time: 6063-0160 OT Individual Time Calculation (min): 60 min    Short Term Goals: Week 3:  OT Short Term Goal 1 (Week 3): Pt will be able to maintain his balance at EOB with S while completing UB bathing to demonstrate improved postural control. OT Short Term Goal 2 (Week 3): Pt will be able to don a shirt with min A. OT Short Term Goal 3 (Week 3): Pt will be able to push from sit to stand with max A of 1 to prepare for clothing management over hips. OT Short Term Goal 4 (Week 3): Pt will be able to use slide board with mod A of 1 and 2nd person to stabilize the board.  Skilled Therapeutic Interventions/Progress Updates:    Pt resting in bed upon arrival.  Pt incontinent of bladder and had pulled his brief down to urinate on bed covers. Pt with questions about the way he has been eating his yogurt and fruit cup. Pt described technique he had been using and questioned if this was correct. Assured pt that he was scooping food out of containers correctly. Pt initially stated he was at his home and his room was across the hallway. Pt also initially stated he lived in "[place]." When questioned, pt stated he needed a little while to think about the answers. Upon requestioning, pt stated he was in the hospital and he lived in [address] when offered a choice of [address] or [place].  Pt able to state his occupation. Pt not sure why he was in the hospital. Pt agreeable to donning clothing. OT intervention with focus on dressing at bed level.  Pt required tot A for donning pants with rolling R/L with mod A and instructional cues for BUE placement. Pt required max A to sit upright in bed. Pt required max A for threading LUE into shirt sleeve. Pt initially attempted to don shirt X 3 without threading LUE into shirt despite max verbal cues to do so.   Pt also attempted to place head in L shirt sleeve. Pt eventually required max A for donning shirt. Pt remained in bed with all needs withn reach and bed alarm activated.   Therapy Documentation Precautions:  Precautions Precautions: None Precaution Comments: Lt hemiparesis with Lt neglect ; watch BP Restrictions Weight Bearing Restrictions: No Other Position/Activity Restrictions: keep BP >160    Pain:  Pt denies pain this morning   Therapy/Group: Individual Therapy  Rich Brave 02/24/2020, 9:17 AM

## 2020-02-24 NOTE — Progress Notes (Signed)
[facility] PHYSICAL MEDICINE & REHABILITATION PROGRESS NOTE   Subjective/Complaints: Patient seen laying in bed this morning.  He states he slept well overnight.  He is quick to respond, despite just being woken up.  ROS: Denies CP, SOB, N/V/D  Objective:   No results found. No results for input(s): WBC, HGB, HCT, PLT in the last 72 hours. No results for input(s): NA, K, CL, CO2, GLUCOSE, BUN, CREATININE, CALCIUM in the last 72 hours.  Intake/Output Summary (Last 24 hours) at 02/24/2020 0856 Last data filed at 02/23/2020 2336 Gross per 24 hour  Intake 1320 ml  Output 500 ml  Net 820 ml     Physical Exam: Vital Signs Blood pressure 133/75, pulse 81, temperature 98.2 F (36.8 C), resp. rate 18, height 6' (1.829 m), weight 89.8 kg, SpO2 100 %. Constitutional: No distress . Vital signs reviewed. HENT: Normocephalic.  Atraumatic. Eyes: EOMI. No discharge. Cardiovascular: No JVD. Respiratory: Normal effort.  No stridor. GI: Non-distended. Skin: Warm and dry.  Intact. Psych: Flat. Musc: No edema in extremities.  No tenderness in extremities. Neuro: Alert Motor: Motor: LUE: 4 -/5 proximal distal with apraxia, stable LLE: 3/5 proximal distal Tone difficult to assess due to patient resistance  Assessment/Plan: 1. Functional deficits secondary to [facility] which require 3+ hours per day of interdisciplinary therapy in a comprehensive inpatient rehab setting.  Physiatrist is providing close team supervision and 24 hour management of active medical problems listed below.  Physiatrist and rehab team continue to assess barriers to discharge/monitor patient progress toward functional and medical goals  Care Tool:  Bathing    Body parts bathed by patient: Abdomen, Front perineal area, Left upper leg, Face   Body parts bathed by helper: Right arm, Left arm, Chest, Buttocks, Right lower leg, Left lower leg     Bathing assist Assist Level: Total Assistance - Patient < 25%     Upper Body  Dressing/Undressing Upper body dressing   What is the patient wearing?: Pull over shirt    Upper body assist Assist Level: Moderate Assistance - Patient 50 - 74%    Lower Body Dressing/Undressing Lower body dressing      What is the patient wearing?: Pants     Lower body assist Assist for lower body dressing: Moderate Assistance - Patient 50 - 74%     Toileting Toileting    Toileting assist Assist for toileting: 2 Helpers(using Stedy + BSC)     Transfers Chair/bed transfer  Transfers assist     Chair/bed transfer assist level: 2 Helpers     Locomotion Ambulation   Ambulation assist   Ambulation activity did not occur: Safety/medical concerns          Walk 10 feet activity   Assist  Walk 10 feet activity did not occur: Safety/medical concerns        Walk 50 feet activity   Assist Walk 50 feet with 2 turns activity did not occur: Safety/medical concerns         Walk 150 feet activity   Assist Walk 150 feet activity did not occur: Safety/medical concerns         Walk 10 feet on uneven surface  activity   Assist Walk 10 feet on uneven surfaces activity did not occur: Safety/medical concerns         Wheelchair     Assist Will patient use wheelchair at discharge?: Yes Type of Wheelchair: Manual    Wheelchair assist level: Dependent - Patient 0% Max wheelchair distance: 150  ft    Wheelchair 50 feet with 2 turns activity    Assist        Assist Level: Dependent - Patient 0%   Wheelchair 150 feet activity     Assist      Assist Level: Dependent - Patient 0%    Medical Problem List and Plan: 1.  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  Status post cerebral angiogram balloon angioplasty  Continue CIR  Bilateral PRAFO 2.  Antithrombotics: -DVT/anticoagulation: Lovenox initiated 01/29/2020             -antiplatelet therapy: Aspirin 325 mg daily 3. Pain Management: Oxycodone as  needed, Robaxin as needed. Heating pad to relax tight paraspinals of cervical and lumbar spine  Changed Oxycodone to Norco 7.5 mg/325 mg q4 hours per son request since been so sedated.  Muscle rub ordered on 5/27, scheduled may be used for neck low back and buttock area  Controlled on 6/6 4. Mood: Provide emotional support             -antipsychotic agents: N/A  Zoloft daily.   Amantadine 100 with breakfast and lunch started on 5/26 after discussion with therapies, improved after discussion with therapies. 5. Neuropsych: This patient is not capable of making decisions on his own behalf. 6. Skin/Wound Care: Routine skin checks 7. Fluids/Electrolytes/Nutrition: Routine in and outs   BMP within acceptable range on 5/20, labs ordered for tomorrow  Carb modified diet 8.  Diabetes mellitus with hyperglycemia.  Hemoglobin A1c 7.7.  Check blood sugars before meals and at bedtime.  Patient on Tresiba 35 units daily, Glucotrol 10 mg daily, Actos 45 mg daily, Janumet 50-1000 mg twice daily, NovoLog 75 units daily prior to admission.               Levemir 31U BID, decreased to 28 twice daily on 5/26, decreased to 25 BID on 5/28, decreased to 22 on 6/3, decreased to 18 BID on 6/5  NovoLog 3 units 3 times daily started on 5/26, increased to 5 TID on 5/28, increased to 8 3 times daily on 6/6  Labile on 6/6 CBG (last 3)  Recent Labs    02/23/20 1635 02/23/20 2149 02/24/20 0651  GLUCAP 176* 237* 114*   9.  Seizure prophylaxis. Keppra 500 mg twice daily, decreased to 250 twice daily on 5/25, d/ced on 5/28 10.  Arrhythmia/wide-complex tachycardia.  Amiodarone 200 mg daily x4 weeks (~6/12).  Follow-up cardiology services 11.  Hypertension with orthostasis.  Autoregulate on Nimotop.  Elevated at rest, however orthostasis noted by therapies, will avoid overcorrection   TEDS and abdominal binder as needed   Slightly labile on 6/6 12.  Hyperlipidemia.  Crestor/fenofibrate 13. Mild vasospasm evident on 5/14  vascular transcranial doppler. 14. Spasticity:  Started baclofen 5mg  TID for pain relief and spasticity, d/ced on 6/2 15.  Hyponatremia: Resolved  Sodium 139 on 5/28  Continue to monitor 16. Transaminitis: Resolved  LFTs WNL on 5/28  Continue to monitor 17.  Acute blood loss anemia  Hemoglobin 11.0 on 6/3, labs ordered for tomorrow  Continue to monitor  LOS: 22 days A FACE TO FACE EVALUATION WAS PERFORMED  Nicholas [name] 8/3 02/24/2020, 8:56 AM

## 2020-02-25 ENCOUNTER — Inpatient Hospital Stay (HOSPITAL_COMMUNITY): Payer: BC Managed Care – PPO | Admitting: Occupational Therapy

## 2020-02-25 ENCOUNTER — Inpatient Hospital Stay (HOSPITAL_COMMUNITY): Payer: BC Managed Care – PPO | Admitting: Speech Pathology

## 2020-02-25 ENCOUNTER — Inpatient Hospital Stay (HOSPITAL_COMMUNITY): Payer: BC Managed Care – PPO

## 2020-02-25 LAB — BASIC METABOLIC PANEL
Anion gap: 9 (ref 5–15)
BUN: 12 mg/dL (ref 8–23)
CO2: 28 mmol/L (ref 22–32)
Calcium: 9.5 mg/dL (ref 8.9–10.3)
Chloride: 102 mmol/L (ref 98–111)
Creatinine, Ser: 0.58 mg/dL — ABNORMAL LOW (ref 0.61–1.24)
GFR calc Af Amer: >60 mL/min (ref >60)
GFR calc non Af Amer: >60 mL/min (ref >60)
Glucose, Bld: 98 mg/dL (ref 70–99)
Potassium: 3.7 mmol/L (ref 3.5–5.1)
Sodium: 139 mmol/L (ref 135–145)

## 2020-02-25 LAB — CBC WITH DIFFERENTIAL/PLATELET
Abs Immature Granulocytes: 0.01 10*3/uL (ref 0.00–0.07)
Basophils Absolute: 0 10*3/uL (ref 0.0–0.1)
Basophils Relative: 1 %
Eosinophils Absolute: 0.1 10*3/uL (ref 0.0–0.5)
Eosinophils Relative: 1 %
HCT: 36.5 % — ABNORMAL LOW (ref 39.0–52.0)
Hemoglobin: 11.8 g/dL — ABNORMAL LOW (ref 13.0–17.0)
Immature Granulocytes: 0 %
Lymphocytes Relative: 29 %
Lymphs Abs: 1.3 10*3/uL (ref 0.7–4.0)
MCH: 29.9 pg (ref 26.0–34.0)
MCHC: 32.3 g/dL (ref 30.0–36.0)
MCV: 92.6 fL (ref 80.0–100.0)
Monocytes Absolute: 0.4 10*3/uL (ref 0.1–1.0)
Monocytes Relative: 9 %
Neutro Abs: 2.7 10*3/uL (ref 1.7–7.7)
Neutrophils Relative %: 60 %
Platelets: 301 10*3/uL (ref 150–400)
RBC: 3.94 MIL/uL — ABNORMAL LOW (ref 4.22–5.81)
RDW: 15.4 % (ref 11.5–15.5)
WBC: 4.5 10*3/uL (ref 4.0–10.5)
nRBC: 0 % (ref 0.0–0.2)

## 2020-02-25 LAB — GLUCOSE, CAPILLARY
Glucose-Capillary: 94 mg/dL (ref 70–99)
Glucose-Capillary: 128 mg/dL — ABNORMAL HIGH (ref 70–99)
Glucose-Capillary: 121 mg/dL — ABNORMAL HIGH (ref 70–99)
Glucose-Capillary: 77 mg/dL (ref 70–99)

## 2020-02-25 NOTE — Progress Notes (Signed)
Physical Therapy Session Note  Patient Details  Name: Nicholas [name] MRN: [id number] Date of Birth: 12/28/1950  Today's Date: 02/25/2020 PT Individual Time: 1415-1545 PT Individual Time Calculation (min): 90 min   Short Term Goals: Week 1:  PT Short Term Goal 1 (Week 1): Pt will perform bed<>chair transfer max +2 without lift equipment PT Short Term Goal 1 - Progress (Week 1): Met PT Short Term Goal 2 (Week 1): Pt will perform sit<>stand with max assist +2 PT Short Term Goal 2 - Progress (Week 1): Met PT Short Term Goal 3 (Week 1): Pt will maintain sitting balance statically EOB with CGA x 5 minutes PT Short Term Goal 3 - Progress (Week 1): Met Week 2:  PT Short Term Goal 1 (Week 2): Patient to be able to roll both ways with no more than ModAx2 and use of bed features PT Short Term Goal 1 - Progress (Week 2): Met PT Short Term Goal 2 (Week 2): patient to be able to get to/from EOB with no more than ModAx2 and use of bed features PT Short Term Goal 2 - Progress (Week 2): Progressing toward goal PT Short Term Goal 3 (Week 2): Patient to be able to transfer from bed to chair with no more than ModAx2 and least restrictive technqiue PT Short Term Goal 3 - Progress (Week 2): Progressing toward goal PT Short Term Goal 4 (Week 2): Patient to tolerate standing for at least 2 minutes in standing frame PT Short Term Goal 4 - Progress (Week 2): Met Week 3:  PT Short Term Goal 1 (Week 3): Patient to be able to complete all supine<->sit transfers with maxAx1 and use of bed features PT Short Term Goal 2 (Week 3): Patient to be able to consistently perform sliding board transfers wtih Mod-MaxA of 1 and standby of second person for safety PT Short Term Goal 3 (Week 3): Patient to tolerate 3-4 minutes in standing frame PT Short Term Goal 4 (Week 3): Paitent to initiate gait training  Skilled Therapeutic Interventions/Progress Updates:    PAIN pt c/o leg pain due to fatigue w/standing, rest breaks provided as  needed. Pt initially OOB in wc w/wife present.  Therapist brought manual hoyer lift w/goal of initiating training w/family.  Wife initially reluctant and insisting that pt could only go home if he was able to perform SPT w/+1 assist and stated SNF/subacute rehab not an option.  Discussed need for alternate safe transfer due to upcoming dc date and w/continued discussion, education, and reassurance, pt agreed to training.  Therapist demonstrated porper posiitoning of sling and operation of lift controls.  Then, w/pt seated in wc therapist positioned sling/ wife observed.   Wife then assisted therapist w/positioing of lift, attachment of sling, lifing pt from wc then returning pt to sitting in wc via lift and use of tilt feature of wc.  Wife leaves session at this point due to her schedule restriction.  Therapist transported pt to gym for session w/emphasis on standing/upright control/balance. Shoes/socks donned max assist. STS w/max assist at hips, therapist stabilizing L knee and hip.  Once in standing worked on wt shifting via isometric resistance at R lateral hip to promote wt shift to R, multimodal cues to promote protraction of L shoulder and hip/glut activation.  Stood up to 2 min, repeated x 4 reps.  While resting in sitting, cues to maintain midline/shift to R.  Pt transported back to room.  Son present and requesting training w/hoyer lift.  Therefore, extended  session to allow for additional family training.  Son instructed as above w/posiitoining of sling and operation of lift.  Son eager to participate and to position/operate lift.  Son observed positioining of sling in wc and assisted w positioing lift w/cues, w/attaching sling to lift, operating lift controls to raise patient, receptive to feedback on turning patient in lift for sideapproach to bed, to lowering bed to best height for lift clearance, then lowering pt safely to bed all performed w/cues and assist from therapist. Then instructed  w/technique for removing sling via rolling.  Pt rolls to R for removal of sling.  Discussed DC options and pros/cons of subacute rehab vs home.  Son requesting to be permitted to transfer pt via hoyer lift independently, educated son that more training necessary to ensure safety w/this prior to clearance by therapist.   Pt left supine w/rails up x 4, son in room, bed in lowest position, and needs in reach.  Therapy Documentation Precautions:  Precautions Precautions: None Precaution Comments: Lt hemiparesis with Lt neglect ; watch BP Restrictions Weight Bearing Restrictions: No Other Position/Activity Restrictions: keep BP >160    Therapy/Group: Individual Therapy  Callie Fielding, PT   Jerrilyn Cairo 02/25/2020, 4:16 PM

## 2020-02-25 NOTE — Progress Notes (Signed)
Speech Language Pathology Weekly Progress and Session Note  Patient Details  Name: Nicholas [name] MRN: [id number] Date of Birth: 1951/05/15  Beginning of progress report period: Feb 18, 2020 End of progress report period: February 25, 2020  Today's Date: 02/25/2020 SLP Individual Time: 0930-1000 SLP Individual Time Calculation (min): 30 min  Short Term Goals: Week 3: SLP Short Term Goal 1 (Week 3): Pt will sustain attention to functional tasks for 5-7 minute intervals with Max A cues. SLP Short Term Goal 1 - Progress (Week 3): Met SLP Short Term Goal 2 (Week 3): Pt will recall new and daily information with Mod A for use of aids or strategies. SLP Short Term Goal 2 - Progress (Week 3): Progressing toward goal SLP Short Term Goal 3 (Week 3): Pt will engage in functional tasks with Mod A for use of strategies for visual scanning and/or locating items on left side of environment. SLP Short Term Goal 3 - Progress (Week 3): Met SLP Short Term Goal 4 (Week 3): Pt will demonstrate functional problem solving skills in basic familiar situations with Mod A multimodal cues. SLP Short Term Goal 4 - Progress (Week 3): Progressing toward goal SLP Short Term Goal 5 (Week 3): Patient will follow 2 step directions with 80% accuracy and Mod A. SLP Short Term Goal 5 - Progress (Week 3): Met    New Short Term Goals: Week 4: SLP Short Term Goal 1 (Week 4): STG=LTG due to remaining LOS  Weekly Progress Updates: Pt has made slow but functional gains and met 3 out of 5 short term goals. Pt is currently Mod-Max assist for basic tasks due to cognitive impairments impacting his sustained attention, basic problem solving, emergent awareness, and short term recall. Pt also with left inattention that further impacts his functional problem solving and cognitive functioning. Pt has demonstrated improved ability to follow multistep directions, verbal output and response time to questions (although still delayed), and sustained  attention for short intervals of time. Pt and family education is ongoing. Pt would continue to benefit from skilled ST while inpatient in order to maximize functional independence and reduce burden of care prior to discharge. Anticipate that pt will need 24/7 supervision at discharge in addition to [place] follow up at next level of care.          Intensity: Minumum of 1-2 x/day, 30 to 90 minutes Frequency: 3 to 5 out of 7 days Duration/Length of Stay: 03/04/20 Treatment/Interventions: Cognitive remediation/compensation;Speech/Language facilitation;Cueing hierarchy;Functional tasks;Internal/external aids;Patient/family education;Therapeutic Activities;Environmental controls   Daily Session  Skilled Therapeutic Interventions: Pt was seen for skilled ST targeting cognition. Pt required Max A to use calendar to orient to date, and multiple choice cues required to orient to place. At end of session, Max A with use of calendar required to accurately verbally recall month. Although pt's verbal responses have increased, intermittent language of confusion and perseveration of certain topics noted. Max A verbal cues were provided to verbally identify 2 cognitive deficits (attention, memory) and verbally problem solving 1 compensatory strategy for each skill. Max A verbal cues required for recall of OT discipline goals, Mod A for recall of PT discipline goals/1 targeted activity. Pt left laying in bed with alarm set and needs within reach. Continue per current plan of care.          Pain Pain Assessment Pain Scale: 0-10 Pain Score: 0-No pain  Therapy/Group: Individual Therapy  Arbutus Leas 02/25/2020, 7:04 AM

## 2020-02-25 NOTE — Progress Notes (Signed)
Occupational Therapy Session Note  Patient Details  Name: Nicholas [name] MRN: [id number] Date of Birth: [birth year]  Today's Date: 02/25/2020 OT Individual Time: 0109-3235 OT Individual Time Calculation (min): 55 min    Short Term Goals: Week 3:  OT Short Term Goal 1 (Week 3): Pt will be able to maintain his balance at EOB with S while completing UB bathing to demonstrate improved postural control. OT Short Term Goal 2 (Week 3): Pt will be able to don a shirt with min A. OT Short Term Goal 3 (Week 3): Pt will be able to push from sit to stand with max A of 1 to prepare for clothing management over hips. OT Short Term Goal 4 (Week 3): Pt will be able to use slide board with mod A of 1 and 2nd person to stabilize the board.  Skilled Therapeutic Interventions/Progress Updates:   Pt in bed agreeable to working with OT.  Pt noted with soiled brief of bowel needing to be addressed. Pt completed rolling right and left x 2 with min assist using bed rails.  Pt needing consistent VCs and TCs to facilitate cross body reaching for bed rail use and flexion of appropriate knee to increase independence with bed mobility.  Pericare and brief management completed with total assist.  Pt bathed UB in long supported sitting in bed needing mod assist to wash chest and back.  Pt bathed bilateral upper legs attending to left leg without cues.  Pt needing max assist to bath lower legs, feet and peri area.  UB dressing completed with mod assist. Pt attempting to donn shirt over head prior to threading left arm through sleeve completely.  OT facilitated pt to attend to LUE and compare sleeve placement to RUE.  Pt corrected sleeve placement with increased time to process.  Pt donned over head and needing assist to pull shirt down in back.  Pt needing max assist to donn shorts, socks, and shoes.  Pt able to bridge partially and pull shorts over hips using BUE however needing assist to pull left side up fully.   Instructed pt  through functional mobility and static sitting posture sitting EOB.  Mod assist supine to sit with bed flat and without bed rails.  Once sitting up, pt initially significantly pushing posteriorly requiring max assist to sit upright.  OT provided visual target and instructed pt to make his chest touch OTs hand therefore encouraging anterior trunk lean.  Pt able to follow instruction and correct posture thereafter with CGA to maintain upright sitting x 5 minutes without distraction.  Pt presented with distraction therafter and demonstrated difficulty maintaining upright posture when attending to outside stimulus and leaning to left quite a bit.  Pt completed sliding board transfer EOB to w/c needing total assist + 2 with pts wife present to assist.  Seat belt in position and on, wife present upon OT departure.     Therapy Documentation Precautions:  Precautions Precautions: None Precaution Comments: Lt hemiparesis with Lt neglect ; watch BP Restrictions Weight Bearing Restrictions: No Other Position/Activity Restrictions: keep BP >160    Pain: Pain Assessment Pain Scale: Faces Pain Score: 7  Faces Pain Scale: Hurts a little bit Pain Type: Acute pain Pain Location: Back Pain Orientation: Right;Left Pain Radiating Towards: BLE Pain Descriptors / Indicators: Discomfort Pain Frequency: Intermittent Pain Onset: On-going Pain Intervention(s): Repositioned;Distraction Multiple Pain Sites: No ADL: ADL Eating: Maximal assistance Grooming: Minimal assistance Where Assessed-Grooming: Sitting at sink Upper Body Bathing: Minimal cueing, Minimal assistance  Where Assessed-Upper Body Bathing: Edge of bed Lower Body Bathing: Maximal assistance Where Assessed-Lower Body Bathing: Bed level Upper Body Dressing: Moderate assistance Where Assessed-Upper Body Dressing: Edge of bed Lower Body Dressing: Maximal assistance Where Assessed-Lower Body Dressing: Bed level Toileting: Not assessed Toilet  Transfer: Not assessed Gaffer Transfer: Not assessed Social research officer, government Method: Other (comment)(roll in shower chair) Youth worker: Other (comment)   Therapy/Group: Individual Therapy  Ezekiel Slocumb 02/25/2020, 1:58 PM

## 2020-02-25 NOTE — Progress Notes (Signed)
[place] PHYSICAL MEDICINE & REHABILITATION PROGRESS NOTE   Subjective/Complaints:  No issues overnite, pt oriented to self and "medical hospital" but not time or situation   ROS: Denies CP, SOB, N/V/D  Objective:   No results found. No results for input(s): WBC, HGB, HCT, PLT in the last 72 hours. No results for input(s): NA, K, CL, CO2, GLUCOSE, BUN, CREATININE, CALCIUM in the last 72 hours.  Intake/Output Summary (Last 24 hours) at 02/25/2020 0819 Last data filed at 02/25/2020 0817 Gross per 24 hour  Intake 680 ml  Output 275 ml  Net 405 ml     Physical Exam: Vital Signs Blood pressure (!) 143/98, pulse 80, temperature 97.7 F (36.5 C), temperature source Oral, resp. rate 16, height 6' (1.829 m), weight 89.8 kg, SpO2 97 %.  General: No acute distress Mood and affect are appropriate Heart: Regular rate and rhythm no rubs murmurs or extra sounds Lungs: Clear to auscultation, breathing unlabored, no rales or wheezes Abdomen: Positive bowel sounds, soft nontender to palpation, nondistended Extremities: No clubbing, cyanosis, or edema Skin: No evidence of breakdown, no evidence of rash  Neuro: Alert Motor: Motor: LUE: 4 -/5 proximal distal with apraxia, stable LLE: 3/5 proximal distal Tone difficult to assess due to patient resistance  Assessment/Plan: 1. Functional deficits secondary to [facility] which require 3+ hours per day of interdisciplinary therapy in a comprehensive inpatient rehab setting.  Physiatrist is providing close team supervision and 24 hour management of active medical problems listed below.  Physiatrist and rehab team continue to assess barriers to discharge/monitor patient progress toward functional and medical goals  Care Tool:  Bathing    Body parts bathed by patient: Abdomen, Front perineal area, Left upper leg, Face   Body parts bathed by helper: Right arm, Left arm, Chest, Buttocks, Right lower leg, Left lower leg     Bathing assist Assist  Level: Total Assistance - Patient < 25%     Upper Body Dressing/Undressing Upper body dressing   What is the patient wearing?: Pull over shirt    Upper body assist Assist Level: Maximal Assistance - Patient 25 - 49%    Lower Body Dressing/Undressing Lower body dressing      What is the patient wearing?: Pants     Lower body assist Assist for lower body dressing: Total Assistance - Patient < 25%     Toileting Toileting    Toileting assist Assist for toileting: 2 Helpers(using Stedy + BSC)     Transfers Chair/bed transfer  Transfers assist     Chair/bed transfer assist level: 2 Helpers     Locomotion Ambulation   Ambulation assist   Ambulation activity did not occur: Safety/medical concerns          Walk 10 feet activity   Assist  Walk 10 feet activity did not occur: Safety/medical concerns        Walk 50 feet activity   Assist Walk 50 feet with 2 turns activity did not occur: Safety/medical concerns         Walk 150 feet activity   Assist Walk 150 feet activity did not occur: Safety/medical concerns         Walk 10 feet on uneven surface  activity   Assist Walk 10 feet on uneven surfaces activity did not occur: Safety/medical concerns         Wheelchair     Assist Will patient use wheelchair at discharge?: Yes Type of Wheelchair: Manual    Wheelchair assist level: Dependent -  Patient 0% Max wheelchair distance: 150 ft    Wheelchair 50 feet with 2 turns activity    Assist        Assist Level: Dependent - Patient 0%   Wheelchair 150 feet activity     Assist      Assist Level: Dependent - Patient 0%    Medical Problem List and Plan: 1.  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  Status post cerebral angiogram balloon angioplasty  Continue CIR  Bilateral PRAFO 2.  Antithrombotics: -DVT/anticoagulation: Lovenox initiated 01/29/2020             -antiplatelet therapy:  Aspirin 325 mg daily 3. Pain Management: Oxycodone as needed, Robaxin as needed. Heating pad to relax tight paraspinals of cervical and lumbar spine  Changed Oxycodone to Norco 7.5 mg/325 mg q4 hours per son request since been so sedated.  Muscle rub ordered on 5/27, scheduled may be used for neck low back and buttock area  Controlled on 6/7, no pain c/os 4. Mood: Provide emotional support             -antipsychotic agents: N/A  Zoloft daily.   Amantadine 100 with breakfast and lunch started on 5/26 after discussion with therapies, improved after discussion with therapies. 5. Neuropsych: This patient is not capable of making decisions on his own behalf. 6. Skin/Wound Care: Routine skin checks 7. Fluids/Electrolytes/Nutrition: Routine in and outs   BMP within acceptable range on 5/20, labs ordered for tomorrow  Carb modified diet 8.  Diabetes mellitus with hyperglycemia.  Hemoglobin A1c 7.7.  Check blood sugars before meals and at bedtime.  Patient on Tresiba 35 units daily, Glucotrol 10 mg daily, Actos 45 mg daily, Janumet 50-1000 mg twice daily, NovoLog 75 units daily prior to admission.               Levemir 31U BID, decreased to 28 twice daily on 5/26, decreased to 25 BID on 5/28, decreased to 22 on 6/3, decreased to 18 BID on 6/5  NovoLog 3 units 3 times daily started on 5/26, increased to 5 TID on 5/28, increased to 8 3 times daily on 6/6   CBG (last 3)  Recent Labs    02/24/20 1635 02/24/20 2107 02/25/20 0549  GLUCAP 86 152* 94  Some lability but generally controlled 9.  Seizure prophylaxis. Keppra 500 mg twice daily, decreased to 250 twice daily on 5/25, d/ced on 5/28 10.  Arrhythmia/wide-complex tachycardia.  Amiodarone 200 mg daily x4 weeks (~6/12).  Follow-up cardiology services 11.  Hypertension with orthostasis.  Autoregulate on Nimotop.  Elevated at rest, however orthostasis noted by therapies, will avoid overcorrection   TEDS and abdominal binder as needed    Vitals:    02/24/20 1920 02/25/20 0448  BP: 132/77 (!) 143/98  Pulse: 82 80  Resp: 16 16  Temp: 98.1 F (36.7 C) 97.7 F (36.5 C)  SpO2: 98% 97%  elevated diastolic will monitor  12.  Hyperlipidemia.  Crestor/fenofibrate 13. Mild vasospasm evident on 5/14 vascular transcranial doppler. 14. Spasticity:  Started baclofen 5mg  TID for pain relief and spasticity, d/ced on 6/2 15.  Hyponatremia: Resolved  Sodium 139 on 5/28  Continue to monitor 16. Transaminitis: Resolved  LFTs WNL on 5/28  Continue to monitor 17.  Acute blood loss anemia  Hemoglobin 11.0 on 6/3, labs ordered for tomorrow  Continue to monitor  LOS: 23 days A FACE TO FACE EVALUATION WAS PERFORMED  8/3 02/25/2020, 8:19 AM

## 2020-02-26 ENCOUNTER — Inpatient Hospital Stay (HOSPITAL_COMMUNITY): Payer: BC Managed Care – PPO | Admitting: Occupational Therapy

## 2020-02-26 ENCOUNTER — Inpatient Hospital Stay (HOSPITAL_COMMUNITY): Payer: BC Managed Care – PPO | Admitting: Speech Pathology

## 2020-02-26 ENCOUNTER — Inpatient Hospital Stay (HOSPITAL_COMMUNITY): Payer: BC Managed Care – PPO

## 2020-02-26 LAB — GLUCOSE, CAPILLARY
Glucose-Capillary: 134 mg/dL — ABNORMAL HIGH (ref 70–99)
Glucose-Capillary: 215 mg/dL — ABNORMAL HIGH (ref 70–99)
Glucose-Capillary: 48 mg/dL — ABNORMAL LOW (ref 70–99)
Glucose-Capillary: 93 mg/dL (ref 70–99)
Glucose-Capillary: 273 mg/dL — ABNORMAL HIGH (ref 70–99)

## 2020-02-26 NOTE — Progress Notes (Signed)
Son in room, concerned that pt having difficulty with redirecting pt, also eats 100% of meals and didn't want to eat lunch, felt that was poisoned. Also blood sugars elevated as well as blood pressure. Notified PA, no verbal orders at this time.

## 2020-02-26 NOTE — Progress Notes (Signed)
[place] PHYSICAL MEDICINE & REHABILITATION PROGRESS NOTE   Subjective/Complaints: Patient seen laying in bed this AM.  He states he slept well overnight.  Incoherent thought.   ROS: Denies CP, SOB, N/V/D  Objective:   No results found. Recent Labs    02/25/20 0744  WBC 4.5  HGB 11.8*  HCT 36.5*  PLT 301   Recent Labs    02/25/20 0859  NA 139  K 3.7  CL 102  CO2 28  GLUCOSE 98  BUN 12  CREATININE 0.58*  CALCIUM 9.5    Intake/Output Summary (Last 24 hours) at 02/26/2020 0813 Last data filed at 02/26/2020 0411 Gross per 24 hour  Intake 1400 ml  Output 175 ml  Net 1225 ml     Physical Exam: Vital Signs Blood pressure (!) 157/85, pulse 78, temperature 97.7 F (36.5 C), temperature source Oral, resp. rate 17, height 6' (1.829 m), weight 89.8 kg, SpO2 98 %. Constitutional: No distress . Vital signs reviewed. HENT: Normocephalic.  Atraumatic. Eyes: EOMI. No discharge. Cardiovascular: No JVD. Respiratory: Normal effort.  No stridor. GI: Non-distended. Skin: Warm and dry.  Intact. Psych: Normal mood.  Normal behavior. Musc: No edema in extremities.  No tenderness in extremities. Neuro: Alert Motor: Motor: LUE: 4 --4/5 proximal distal with apraxia LLE: 3/5 proximal distal, stable Tone difficult to assess due to patient resistance  Assessment/Plan: 1. Functional deficits secondary to [facility] which require 3+ hours per day of interdisciplinary therapy in a comprehensive inpatient rehab setting.  Physiatrist is providing close team supervision and 24 hour management of active medical problems listed below.  Physiatrist and rehab team continue to assess barriers to discharge/monitor patient progress toward functional and medical goals  Care Tool:  Bathing    Body parts bathed by patient: Right arm, Left arm, Abdomen, Right upper leg, Left upper leg, Face   Body parts bathed by helper: Front perineal area, Buttocks, Right lower leg, Left lower leg, Chest      Bathing assist Assist Level: Moderate Assistance - Patient 50 - 74%     Upper Body Dressing/Undressing Upper body dressing   What is the patient wearing?: Pull over shirt    Upper body assist Assist Level: Maximal Assistance - Patient 25 - 49%    Lower Body Dressing/Undressing Lower body dressing      What is the patient wearing?: Incontinence brief, Pants     Lower body assist Assist for lower body dressing: Maximal Assistance - Patient 25 - 49%     Toileting Toileting    Toileting assist Assist for toileting: Dependent - Patient 0%     Transfers Chair/bed transfer  Transfers assist     Chair/bed transfer assist level: Dependent - mechanical lift     Locomotion Ambulation   Ambulation assist   Ambulation activity did not occur: Safety/medical concerns          Walk 10 feet activity   Assist  Walk 10 feet activity did not occur: Safety/medical concerns        Walk 50 feet activity   Assist Walk 50 feet with 2 turns activity did not occur: Safety/medical concerns         Walk 150 feet activity   Assist Walk 150 feet activity did not occur: Safety/medical concerns         Walk 10 feet on uneven surface  activity   Assist Walk 10 feet on uneven surfaces activity did not occur: Safety/medical concerns  Wheelchair     Assist Will patient use wheelchair at discharge?: Yes Type of Wheelchair: Manual    Wheelchair assist level: Dependent - Patient 0% Max wheelchair distance: 150 ft    Wheelchair 50 feet with 2 turns activity    Assist        Assist Level: Dependent - Patient 0%   Wheelchair 150 feet activity     Assist      Assist Level: Dependent - Patient 0%    Medical Problem List and Plan: 1.  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  Status post cerebral angiogram balloon angioplasty  Continue CIR  Bilateral PRAFO 2.  Antithrombotics: -DVT/anticoagulation:  Lovenox initiated 01/29/2020             -antiplatelet therapy: Aspirin 325 mg daily 3. Pain Management: Oxycodone as needed, Robaxin as needed. Heating pad to relax tight paraspinals of cervical and lumbar spine  Changed Oxycodone to Norco 7.5 mg/325 mg q4 hours per son request since been so sedated.  Muscle rub ordered on 5/27, scheduled may be used for neck low back and buttock area  Controlled on 6/8 4. Mood: Provide emotional support             -antipsychotic agents: N/A  Zoloft daily.   Amantadine 100 with breakfast and lunch started on 5/26 after discussion with therapies, improved after discussion with therapies. 5. Neuropsych: This patient is not capable of making decisions on his own behalf. 6. Skin/Wound Care: Routine skin checks 7. Fluids/Electrolytes/Nutrition: Routine in and outs   BMP within acceptable range on 6/7  Carb modified diet 8.  Diabetes mellitus with hyperglycemia.  Hemoglobin A1c 7.7.  Check blood sugars before meals and at bedtime.  Patient on Tresiba 35 units daily, Glucotrol 10 mg daily, Actos 45 mg daily, Janumet 50-1000 mg twice daily, NovoLog 75 units daily prior to admission.               Levemir 31U BID, decreased to 28 twice daily on 5/26, decreased to 25 BID on 5/28, decreased to 22 on 6/3, decreased to 18 BID on 6/5  NovoLog 3 units 3 times daily started on 5/26, increased to 5 TID on 5/28, increased to 8 3 times daily on 6/6   CBG (last 3)  Recent Labs    02/25/20 1701 02/25/20 2041 02/26/20 0609  GLUCAP 121* 77 134*   Relatively controlled on 6/8 9.  Seizure prophylaxis. Keppra 500 mg twice daily, decreased to 250 twice daily on 5/25, d/ced on 5/28 10.  Arrhythmia/wide-complex tachycardia.  Amiodarone 200 mg daily x4 weeks (~6/12).  Follow-up cardiology services 11.  Hypertension with orthostasis.  Autoregulate on Nimotop.  Elevated at rest, however orthostasis noted by therapies, will avoid overcorrection   TEDS and abdominal binder as needed     Vitals:   02/25/20 1941 02/26/20 0400  BP: 129/77 (!) 157/85  Pulse: 80 78  Resp: 18 17  Temp: 98.1 F (36.7 C) 97.7 F (36.5 C)  SpO2: 97% 98%   Elevated on 6/8, otherwise relatively controlled 12.  Hyperlipidemia.  Crestor/fenofibrate 13. Mild vasospasm evident on 5/14 vascular transcranial doppler. 14. Spasticity:  Started baclofen 5mg  TID for pain relief and spasticity, d/ced on 6/2 15.  Hyponatremia: Resolved  Sodium 139 on 5/28  Continue to monitor 16. Transaminitis: Resolved  LFTs WNL on 5/28  Continue to monitor 17.  Acute blood loss anemia  Hemoglobin 11.8 on 6/7  Continue to monitor  LOS:  24 days A FACE TO FACE EVALUATION WAS PERFORMED  Samari Gorby Karis Juba 02/26/2020, 8:13 AM

## 2020-02-26 NOTE — Significant Event (Signed)
Hypoglycemic Event  CBG: 48  Treatment: 4 oz juice/soda  Symptoms: None  Follow-up CBG: Time:1647 CBG Result:93  Possible Reasons for Event: Inadequate meal intake  Comments/MD notified: Standing orders followed    Scot Jun Quinn Bartling

## 2020-02-26 NOTE — Progress Notes (Signed)
Speech Language Pathology Daily Session Note  Patient Details  Name: Nicholas [name] MRN: [id number] Date of Birth: [birth year]  Today's Date: 02/26/2020 SLP Individual Time: 2993-7169 SLP Individual Time Calculation (min): 29 min  Short Term Goals: Week 4: SLP Short Term Goal 1 (Week 4): STG=LTG due to remaining LOS  Skilled Therapeutic Interventions: Pt was seen for skilled ST targeting cognition. Pt's son present throughout session. SLP facilitated session with introduction of memory notebook as compensatory strategy to assist with recall and carryover of new information/techniques from rehab stay and daily activities. Total A required for recall of course of hospitalization and names of medical/therapy team. Multiple choice cues continue to be required for pt to discriminate between therapy disciplines and recall purpose/targeted activities. Pt independently oriented to place today, however Mod A required for orientation to date with use of calendar, and Max A for use of aids to recall date at end of session (~15 minute delay). Overall Max A multimodal cues required for sustained attention to functional tasks and conversation. Discussed interventions and challenges with new learning/carryover in the setting of acute brain injury/SDH with son. Pt left sitting in chair with alarm set and needs within reach, son still present. Continue per current plan of care.        Pain Pain Assessment Pain Scale: Faces Faces Pain Scale: No hurt   Therapy/Group: Individual Therapy  [name] 02/26/2020, 7:21 AM

## 2020-02-26 NOTE — Progress Notes (Signed)
Physical Therapy Session Note  Patient Details  Name: Nicholas [name] MRN: [id number] Date of Birth: March 16, 1951  Today's Date: 02/26/2020 PT Individual Time: 1415-1500 PT Individual Time Calculation (min): 45 min   Short Term Goals: Week 1:  PT Short Term Goal 1 (Week 1): Pt will perform bed<>chair transfer max +2 without lift equipment PT Short Term Goal 1 - Progress (Week 1): Met PT Short Term Goal 2 (Week 1): Pt will perform sit<>stand with max assist +2 PT Short Term Goal 2 - Progress (Week 1): Met PT Short Term Goal 3 (Week 1): Pt will maintain sitting balance statically EOB with CGA x 5 minutes PT Short Term Goal 3 - Progress (Week 1): Met Week 2:  PT Short Term Goal 1 (Week 2): Patient to be able to roll both ways with no more than ModAx2 and use of bed features PT Short Term Goal 1 - Progress (Week 2): Met PT Short Term Goal 2 (Week 2): patient to be able to get to/from EOB with no more than ModAx2 and use of bed features PT Short Term Goal 2 - Progress (Week 2): Progressing toward goal PT Short Term Goal 3 (Week 2): Patient to be able to transfer from bed to chair with no more than ModAx2 and least restrictive technqiue PT Short Term Goal 3 - Progress (Week 2): Progressing toward goal PT Short Term Goal 4 (Week 2): Patient to tolerate standing for at least 2 minutes in standing frame PT Short Term Goal 4 - Progress (Week 2): Met Week 3:  PT Short Term Goal 1 (Week 3): Patient to be able to complete all supine<->sit transfers with maxAx1 and use of bed features PT Short Term Goal 2 (Week 3): Patient to be able to consistently perform sliding board transfers wtih Mod-MaxA of 1 and standby of second person for safety PT Short Term Goal 3 (Week 3): Patient to tolerate 3-4 minutes in standing frame PT Short Term Goal 4 (Week 3): Paitent to initiate gait training  Skilled Therapeutic Interventions/Progress Updates:    PAIN no c/os today  Pt seen bedside for session.  Son present x 63,  wife arrived for last 20mn.  Pt agreeable to treatment but very slow and inconsistently following simple commande. Supine to R side to sit w/max assist.  Initially pushing strongly w/LUE and leaning posteriorly.  Worked on reaching anteriorly and reaching across midline to improve midline sitting.  Pt engaged in reaching w/simple commands/reaching to targets. Squat pvt bed to wc total assist of 1 due to pushing.  During transfer, pt noted to require change of brief/pants due to urinary incontinence.  Wife arrived at this time so treatment then focused on hoyer lift training back to bed w/son/wife.  Wife able to position sling w/max cueing/assist from therapist.  Wife then operated/positioned hoyer lift as son managed wc and therapist talked them thru steps of use.  Wife recalled steps for attaching sling to lift, max cues for operating controls to raise/lower patient, maneuver lift to bed, management of controls/brakes etc.  Pt transferred safely to bed w/therapist guidance/instruction, cueing and occasional physical assist.   Pt left supine in bed w/rails up x 4, family in room, and NT notified/agreed to assist w/changing pt/urinary incontinence. . Family agreeable to training today and would benefit from further training w/lift as this is most likely going to be needed if dc to home.   Therapy Documentation Precautions:  Precautions Precautions: None Precaution Comments: Lt hemiparesis with Lt neglect ;  watch BP Restrictions Weight Bearing Restrictions: No Other Position/Activity Restrictions: keep BP >160   Therapy/Group: Individual Therapy  Callie Fielding, PT   Jerrilyn Cairo 02/26/2020, 4:12 PM

## 2020-02-26 NOTE — Progress Notes (Signed)
Occupational Therapy Session Note  Patient Details  Name: Nicholas [name] MRN: [id number] Date of Birth: Jul 14, 1951  Today's Date: 02/26/2020 OT Individual Time: 1005-1105 OT Individual Time Calculation (min): 60 min    Short Term Goals: Week 3:  OT Short Term Goal 1 (Week 3): Pt will be able to maintain his balance at EOB with S while completing UB bathing to demonstrate improved postural control. OT Short Term Goal 2 (Week 3): Pt will be able to don a shirt with min A. OT Short Term Goal 3 (Week 3): Pt will be able to push from sit to stand with max A of 1 to prepare for clothing management over hips. OT Short Term Goal 4 (Week 3): Pt will be able to use slide board with mod A of 1 and 2nd person to stabilize the board.  Skilled Therapeutic Interventions/Progress Updates:    Pt up in w/c with son at side upon OT arrival.  Pt exhibiting mild confusion initially reporting combing his hair and preparing for artillary.  Pts son reports pt was just watching the news and may be getting confused because of that.  OT reoriented pt to current OT session to facilitate improved pt participation.  Instructed pt through attending to task and BUE use to donn compression socks and shoes.  Pt reaching to initiate however with very little follow through during donning and needing max cues to keep visual attention on task.    Pt completed sit <> stand in standing frame with VCs and visual cues to provide pt with target to facilitate forward trunk leaning to center. Provided visual cue to facilitate anterior lean frequently and intermittently throughout session.  Pt cued to use BUE to support in increased upright standing posture with good follow through noted.  Facilitated left sided scanning and right sided weight shifting to grasp cups and horse shoes (on left side) and give to tech (placed on right side).  Pt needing intermittent seated rest break due to pt reporting low back pain and some knee discomfort from pad  of standing frame.    Next, seated neuro re-ed to facilitate seated righting reactions and cervical/head posturing completed with visual feedback from mirror.  Pt initially needing added visual target to correct, for example "make your right shoulder touch my hand", however OT able to fade additional cueing with pt self correcting posture to midline using mirror and min VCs.  BUE batting ball over target on floor using 2# dowel with VCs and TCs to maximize quality of movement.  Pt able to track ball and hit accurately approximately 80% of the time.  Upright posture and self righting delayed when added distraction of ball activity.  Pt returned to room with seat belt alarm donned, call bell in reach.    Therapy Documentation Precautions:  Precautions Precautions: None Precaution Comments: Lt hemiparesis with Lt neglect ; watch BP Restrictions Weight Bearing Restrictions: No Other Position/Activity Restrictions: keep BP >160   Pain: Pain Assessment Pain Scale: Faces Pain Score: 3  Faces Pain Scale: No hurt Pain Type: Acute pain Pain Location: Back Pain Orientation: Right;Left Pain Descriptors / Indicators: Aching Pain Frequency: Constant Pain Onset: On-going Patients Stated Pain Goal: 0 Pain Intervention(s): Medication (See eMAR) ADL: ADL Eating: Maximal assistance Grooming: Minimal assistance Where Assessed-Grooming: Sitting at sink Upper Body Bathing: Minimal cueing, Minimal assistance Where Assessed-Upper Body Bathing: Edge of bed Lower Body Bathing: Maximal assistance Where Assessed-Lower Body Bathing: Bed level Upper Body Dressing: Moderate assistance Where Assessed-Upper Body  Dressing: Edge of bed Lower Body Dressing: Maximal assistance Where Assessed-Lower Body Dressing: Bed level Toileting: Not assessed Toilet Transfer: Not assessed Gaffer Transfer: Not assessed Social research officer, government Method: Other (comment)(roll in shower chair) Youth worker:  Other (comment)      Therapy/Group: Individual Therapy  Nicholas [name] 02/26/2020, 12:55 PM

## 2020-02-27 ENCOUNTER — Inpatient Hospital Stay (HOSPITAL_COMMUNITY): Payer: BC Managed Care – PPO | Admitting: Occupational Therapy

## 2020-02-27 ENCOUNTER — Inpatient Hospital Stay (HOSPITAL_COMMUNITY): Payer: BC Managed Care – PPO | Admitting: Speech Pathology

## 2020-02-27 ENCOUNTER — Inpatient Hospital Stay (HOSPITAL_COMMUNITY): Payer: BC Managed Care – PPO | Admitting: Physical Therapy

## 2020-02-27 DIAGNOSIS — M791 Myalgia, unspecified site: Secondary | ICD-10-CM

## 2020-02-27 LAB — GLUCOSE, CAPILLARY
Glucose-Capillary: 115 mg/dL — ABNORMAL HIGH (ref 70–99)
Glucose-Capillary: 177 mg/dL — ABNORMAL HIGH (ref 70–99)
Glucose-Capillary: 146 mg/dL — ABNORMAL HIGH (ref 70–99)
Glucose-Capillary: 208 mg/dL — ABNORMAL HIGH (ref 70–99)

## 2020-02-27 MED ORDER — NIMODIPINE 6 MG/ML PO SOLN
60.0000 mg | ORAL | Status: DC
Start: 2020-02-27 — End: 2020-02-27

## 2020-02-27 MED ORDER — NIMODIPINE 30 MG PO CAPS
30.0000 mg | ORAL_CAPSULE | ORAL | Status: DC
  Filled 2020-02-27 (×4): qty 1

## 2020-02-27 MED ORDER — INSULIN DETEMIR 100 UNIT/ML ~~LOC~~ SOLN
10.0000 [IU] | Freq: Two times a day (BID) | SUBCUTANEOUS | Status: DC
  Administered 2020-02-27 – 2020-03-06 (×16): 10 [IU] via SUBCUTANEOUS
  Filled 2020-02-27 (×17): qty 0.1

## 2020-02-27 MED ORDER — NIMODIPINE 6 MG/ML PO SOLN
30.0000 mg | ORAL | Status: DC
  Administered 2020-02-27 – 2020-02-29 (×12): 30 mg via ORAL
  Filled 2020-02-27 (×13): qty 5

## 2020-02-27 MED ORDER — AMANTADINE HCL 50 MG/5ML PO SYRP
50.0000 mg | ORAL_SOLUTION | Freq: Two times a day (BID) | ORAL | Status: DC
  Administered 2020-02-27 – 2020-03-01 (×6): 50 mg via ORAL
  Filled 2020-02-27 (×7): qty 5

## 2020-02-27 MED ORDER — NIMODIPINE 30 MG PO CAPS: 30.0000 mg | ORAL_CAPSULE | ORAL | Status: DC

## 2020-02-27 NOTE — [place] FL2 (Addendum)
[place] MEDICAID FL2 LEVEL OF CARE SCREENING TOOL     IDENTIFICATION  Patient Name: Nicholas [name] Birthdate: [birth year] Sex: male Admission Date (Current Location): 02/02/2020  [facility] and [place] Number:  Producer, television/film/video and Address:  [facility] [place], [address] [place], [place] [address]      Provider Number:    Attending Physician Name and Address:  Marcello Fennel, MD  Relative Name and Phone Number:  Javelle Donigan [phone]    Current Level of Care: Hospital Recommended Level of Care: Nursing Facility, Skilled Nursing Facility Prior Approval Number:    Date Approved/Denied:   PASRR Number:   [id number]  Discharge Plan: SNF    Current Diagnoses: Patient Active Problem List   Diagnosis Date Noted   Myalgia    Benign essential HTN    Seizure prophylaxis    Spastic hemiparesis (HCC)    Labile blood pressure    Labile blood glucose    Pressure injury of skin 02/05/2020   Orthostasis    Acute blood loss anemia    Hypertensive crisis    Hyponatremia    SDH (subdural hematoma) (HCC)    Transaminitis    Wide-complex tachycardia (HCC)    Intracranial vasospasm    Essential hypertension    Controlled type 2 diabetes mellitus with hyperglycemia (HCC)    Hepatitis    Abdominal distention    NSVT (nonsustained ventricular tachycardia) (HCC)    Rate-related bundle branch block    Acute hypoxemic respiratory failure (HCC)    Aneurysm (HCC)    SAH (subarachnoid hemorrhage) (HCC) 01/14/2020   Other and unspecified hyperlipidemia 07/17/2013   Need for prophylactic vaccination and inoculation against influenza 07/17/2013   Type II or unspecified type diabetes mellitus without mention of complication, not stated as uncontrolled 04/12/2013   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   Elevated transaminase measurement 04/12/2013   Type II or unspecified type diabetes mellitus without mention of complication, uncontrolled  01/05/2013   Essential hypertension, benign 01/05/2013   BPH (benign prostatic hyperplasia) 01/05/2013   Overweight(278.02) 01/05/2013    Orientation RESPIRATION BLADDER Height & Weight        Normal Incontinent Weight: 197 lb 15.6 oz (89.8 kg) Height:  6' (182.9 cm)  BEHAVIORAL SYMPTOMS/MOOD NEUROLOGICAL BOWEL NUTRITION STATUS      Incontinent Diet  AMBULATORY STATUS COMMUNICATION OF NEEDS Skin   Extensive Assist Verbally Normal                       Personal Care Assistance Level of Assistance  Total care, Bathing, Feeding, Dressing Bathing Assistance: Maximum assistance Feeding assistance: Maximum assistance Dressing Assistance: Maximum assistance Total Care Assistance: Maximum assistance   Functional Limitations Info             SPECIAL CARE FACTORS FREQUENCY  PT (By licensed PT), OT (By licensed OT), Speech therapy     PT Frequency: 5x a week OT Frequency: 5x a week     Speech Therapy Frequency: 5x a week      Contractures      Additional Factors Info                  Current Medications (02/27/2020):  This is the current hospital active medication list Current Facility-Administered Medications  Medication Dose Route Frequency Provider Last Rate Last Admin   acetaminophen (TYLENOL) tablet 650 mg  650 mg Oral Q4H PRN Charlton Amor, PA-C   650 mg at 02/26/20  1812   Or   acetaminophen (TYLENOL) 160 MG/5ML solution 650 mg  650 mg Per Tube Q4H PRN Angiulli, Lavon Paganini, PA-C       Or   acetaminophen (TYLENOL) suppository 650 mg  650 mg Rectal Q4H PRN Angiulli, Lavon Paganini, PA-C       amantadine (SYMMETREL) 50 MG/5ML solution 50 mg  50 mg Oral BID WC Jamse Arn, MD   50 mg at 02/27/20 1249   amiodarone (PACERONE) tablet 200 mg  200 mg Oral Daily Cathlyn Parsons, PA-C   200 mg at 02/27/20 7564   aspirin tablet 325 mg  325 mg Oral Daily Cathlyn Parsons, PA-C   325 mg at 02/27/20 3329   bisacodyl (DULCOLAX) suppository 10 mg  10 mg  Rectal Daily PRN Cathlyn Parsons, PA-C       cholecalciferol (VITAMIN D3) tablet 1,000 Units  1,000 Units Oral Daily Cathlyn Parsons, PA-C   1,000 Units at 02/27/20 0828   enoxaparin (LOVENOX) injection 40 mg  40 mg Subcutaneous Q24H Cathlyn Parsons, PA-C   40 mg at 02/26/20 1812   fenofibrate tablet 160 mg  160 mg Oral Daily Cathlyn Parsons, PA-C   160 mg at 02/27/20 5188   hydrALAZINE (APRESOLINE) tablet 25 mg  25 mg Oral Q8H PRN Izora Ribas, MD   25 mg at 02/04/20 0610   HYDROcodone-acetaminophen (NORCO) 7.5-325 MG per tablet 1 tablet  1 tablet Oral Q4H PRN Lovorn, Jinny Blossom, MD   1 tablet at 02/25/20 1027   insulin aspart (novoLOG) injection 0-6 Units  0-6 Units Subcutaneous TID WC AngiulliLavon Paganini, PA-C   1 Units at 02/27/20 1245   insulin aspart (novoLOG) injection 8 Units  8 Units Subcutaneous TID WC Jamse Arn, MD   8 Units at 02/27/20 1245   insulin detemir (LEVEMIR) injection 10 Units  10 Units Subcutaneous BID Jamse Arn, MD       methocarbamol (ROBAXIN) tablet 500 mg  500 mg Oral Q8H PRN Jamse Arn, MD   500 mg at 02/26/20 4166   Muscle Rub CREA   Topical BID Charlett Blake, MD   Given at 02/27/20 0831   niMODipine (NYMALIZE) 6 MG/ML oral solution 30 mg  30 mg Oral Q4H Jamse Arn, MD   30 mg at 02/27/20 1240   Or   niMODipine (NIMOTOP) capsule 30 mg  30 mg Oral Q4H Jamse Arn, MD       rosuvastatin (CRESTOR) tablet 40 mg  40 mg Oral Daily Cathlyn Parsons, PA-C   40 mg at 02/27/20 0630   senna-docusate (Senokot-S) tablet 1 tablet  1 tablet Oral BID Cathlyn Parsons, PA-C   1 tablet at 02/26/20 2140   sertraline (ZOLOFT) tablet 25 mg  25 mg Oral Daily Raulkar, Clide Deutscher, MD   25 mg at 02/27/20 1601   simethicone (MYLICON) chewable tablet 160 mg  160 mg Oral QID PRN AngiulliLavon Paganini, PA-C       sorbitol 70 % solution 30 mL  30 mL Oral Daily PRN Cathlyn Parsons, PA-C   30 mL at 02/25/20 0932      Discharge Medications: Please see discharge summary for a list of discharge medications.  Relevant Imaging Results:  Relevant Lab Results:   Additional Information    Dyanne Iha

## 2020-02-27 NOTE — Progress Notes (Signed)
Occupational Therapy Session Note  Patient Details  Name: Nicholas [name] MRN: [id number] Date of Birth: 05-15-1951  Today's Date: 02/27/2020 OT Individual Time: 1005-1105 OT Individual Time Calculation (min): 60 min    Short Term Goals: Week 3:  OT Short Term Goal 1 (Week 3): Pt will be able to maintain his balance at EOB with S while completing UB bathing to demonstrate improved postural control. OT Short Term Goal 2 (Week 3): Pt will be able to don a shirt with min A. OT Short Term Goal 3 (Week 3): Pt will be able to push from sit to stand with max A of 1 to prepare for clothing management over hips. OT Short Term Goal 4 (Week 3): Pt will be able to use slide board with mod A of 1 and 2nd person to stabilize the board.  Skilled Therapeutic Interventions/Progress Updates:    Pt supine in bed with son at bedside.  Pt nonverbal for most of session this day.  No signs exhibited indicating pain. Pt needing Min VCs to initiate washing left arm and to move to next step of sequence due to pt perseverating on each step. Pt able to wash bilateral feet with min assist to bring LE into figure 4 position.  Total assist needed to donn TED hose.  Max assist to thread and pull pants up over hips in bridge position however pt showing some effort to use BUE to assist in pulling pants.  Shoes donned with total assist.  Pt completed supine to sit with max assist with max VCs and TCs for optimum body mechanics, and immediately with significant posterior push present upon upright position.  Pt less responsive to VCs and visual cues to correct sitting posture today.  Pt needing Max assist with +1 of rehab tech to hold sliding board to complete EOB to w/c transfer.  Total assist +2 to reposition buttocks posteriorly in chair. Pt completed oral hygiene, grooming hair, and application of deoderant at sink needing max multimodal cues to move to next task and stay on task due to pt easily distracted by external stimuli.   Intermittent mod assist needed to re-center trunk because pt significantly leaning to left despite max multimodal cues.  Seat belt alarm donned/on, call bell in reach upon OT departure.  Therapy Documentation Precautions:  Precautions Precautions: None Precaution Comments: Lt hemiparesis with Lt neglect ; watch BP Restrictions Weight Bearing Restrictions: No Other Position/Activity Restrictions: keep BP >160   Pain: Pain Assessment Pain Scale: Faces Faces Pain Scale: No hurt ADL: ADL Eating: Maximal assistance Grooming: Minimal assistance Where Assessed-Grooming: Sitting at sink Upper Body Bathing: Minimal cueing, Minimal assistance Where Assessed-Upper Body Bathing: Edge of bed Lower Body Bathing: Maximal assistance Where Assessed-Lower Body Bathing: Bed level Upper Body Dressing: Moderate assistance Where Assessed-Upper Body Dressing: Edge of bed Lower Body Dressing: Maximal assistance Where Assessed-Lower Body Dressing: Bed level Toileting: Not assessed Toilet Transfer: Not assessed Psychologist, counselling Transfer: Not assessed Film/video editor Method: Other (comment)(roll in shower chair) Astronomer: Other (comment)      Therapy/Group: Individual Therapy  Amie Critchley 02/27/2020, 1:15 PM

## 2020-02-27 NOTE — Progress Notes (Signed)
Patient ID: Nicholas [name], male   DOB: 1951/07/28, 69 y.o.   MRN: [id number]  Team Conference Report to Patient/Family  Team Conference discussion was reviewed with the patient and caregiver, including goals, any changes in plan of care and target discharge date.  Patient and caregiver express understanding and are in agreement.  The patient has a target discharge date of 03/04/20.  Andria Rhein 02/27/2020, 1:33 PM

## 2020-02-27 NOTE — Progress Notes (Signed)
Speech Language Pathology Daily Session Note  Patient Details  Name: Nicholas [name] MRN: [id number] Date of Birth: Dec 20, 1950  Today's Date: 02/27/2020 SLP Individual Time: 1132-1200 SLP Individual Time Calculation (min): 28 min  Short Term Goals: Week 4: SLP Short Term Goal 1 (Week 4): STG=LTG due to remaining LOS  Skilled Therapeutic Interventions: Pt was seen for skilled ST targeting cognitive goals. Pt was independently oriented to place, however Mod A verbal and visual cues required for orientation to time, Min A question cues to recall general reason for hospitalization. Majority of session spent redirecting pt to functional conversation and hands on task; he was highly distracted by his shoe laces, continuously attempting to untie them. Max A verbal cues provided for sustained attention and recall within tasks. Pt also required Moderate verbal and visual cues for problem solving a 2 step action card sequencing task. Pt left sitting in chair with seatbelt alarm in place and on, call bell at side. Continue per current plan of care.      Pain Pain Assessment Pain Scale: Faces Faces Pain Scale: No hurt  Therapy/Group: Individual Therapy  Nicholas [name] 02/27/2020, 7:19 AM

## 2020-02-27 NOTE — Patient Care Conference (Signed)
Inpatient RehabilitationTeam Conference and Plan of Care Update Date: 02/27/2020   Time: 2:44 PM    Patient Name: Nicholas [name]      Medical Record Number: [id number]  Date of Birth: Nov 01, 1950 Sex: Male         Room/Bed: 4W01C/4W01C-01 Payor Info: Payor: [place] / Plan: BCBS COMM PPO / Product Type: *No Product type* /    Admit Date/Time:  02/02/2020  3:33 PM  Primary Diagnosis:  SDH (subdural hematoma) ([place])  Patient Active Problem List   Diagnosis Date Noted  . Myalgia   . Benign essential HTN   . Seizure prophylaxis   . Spastic hemiparesis ([place])   . Labile blood pressure   . Labile blood glucose   . Pressure injury of skin 02/05/2020  . Orthostasis   . Acute blood loss anemia   . Hypertensive crisis   . Hyponatremia   . SDH (subdural hematoma) ([place])   . Transaminitis   . Wide-complex tachycardia ([place])   . Intracranial vasospasm   . Essential hypertension   . Controlled type 2 diabetes mellitus with hyperglycemia ([place])   . Hepatitis   . Abdominal distention   . NSVT (nonsustained ventricular tachycardia) ([place])   . Rate-related bundle branch block   . Acute hypoxemic respiratory failure ([place])   . Aneurysm ([place])   . SAH (subarachnoid hemorrhage) ([place]) 01/14/2020  . Other and unspecified hyperlipidemia 07/17/2013  . Need for prophylactic vaccination and inoculation against influenza 07/17/2013  . Type II or unspecified type diabetes mellitus without mention of complication, not stated as uncontrolled 04/12/2013  . 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 Elevated transaminase measurement 04/12/2013  . Type II or unspecified type diabetes mellitus without mention of complication, uncontrolled 01/05/2013  . Essential hypertension, benign 01/05/2013  . BPH (benign prostatic hyperplasia) 01/05/2013  . Overweight(278.02) 01/05/2013    Expected Discharge Date: Expected Discharge Date: 03/04/20  Team Members Present: Physician leading conference: Dr. Delice Lesch Care  Coodinator Present: Nestor Lewandowsky, RN, BSN, CRRN;Christina Sampson Goon, BSW Nurse Present: Isla Pence, RN PT Present: Deniece Ree, PT OT Present: Other (comment)(Bonnie Irish Elders, OT) SLP Present: Jettie Booze, CF-SLP PPS Coordinator present : Ileana Ladd, PT     Current Status/Progress Goal Weekly Team Focus  Bowel/Bladder   Patient incontinent of bowel and bladder. LBM 02/25/2020  Patient will regain bowel and bladder continence  Assess toileting needs every shift and PRN   Swallow/Nutrition/ Hydration             ADL's   Max to total assist LB ADLs and toileting, mod assist UB dressing and bathing; mod to max assist for functional transfers  min A toilet transfer, dressing, bathing; mod A toileting and shower transfer (goals may need to be downgraded to mod -max A)  postural training, balance, L side awareness, neuro re-ed, righting reaction, ADL training, functional transfer training   Mobility   attention and participation are still better BUT can be very inconsistent and ranges from ModAx2 to max-totalAx2 even with sliding board transfers. Standing frame 3-4 minutes. heavy left and anterior lean. Likely downgrading goals soon. Would benefit from SNF.  mod-maxA, WC with S  family training w/hoyer lift, need continued training   Communication   Min A, does exhibit language of confusion at times  Min A  goal met   Safety/Cognition/ Behavioral Observations  Mod-Max, very little change/progress this week  Min A  sustained attention, recall, orientation, error awareness, functional problem solving   Pain  Pain well managed with FACES pain rating a 2  Patient will continue with satisfactory pain levels less than 3.  Assess every shift and PRN   Skin   Skin intact.  Maintain skin healing and integrity with peri care, barrier creams and foam dressing. Encourage balanced nutrition.  Assess skin every shift and PRN. Continue turns and pericare    Rehab Goals Patient on target to meet  rehab goals: Yes Rehab Goals Revised: progressing with current goals, possbile revision *See Care Plan and progress notes for long and short-term goals.     Barriers to Discharge  Current Status/Progress Possible Resolutions Date Resolved   Nursing                  PT  Home environment access/layout;Behavior;Inaccessible home environment;Incontinence;Decreased caregiver support;Lack of/limited family support  very heavy levels of physical assist, reduced cognition. Likely downgrading goals soon.              OT                  SLP                Care Coordinator Medical stability;Lack of/limited family support              Discharge Planning/Teaching Needs:  Patient plans to discharge home with spouse and sons to provide care  Will schedule if recommended.   Team Discussion:  Patient reported bilateral thigh pain at HS. CBGs fluctuating on low end; Lantus dose was adjusted per MD. Function overall varies due to patient pushing left during activities, tangential and it is difficult to change thoughts patient is not consistently following commands and continues to exhibit a language of confusion. MD to adjust medications. Staff concerned that the patient is too heavy duty care for the wife to provide and the wife is apprehensive about discharging to a SNF and not to their home.   Revisions to Treatment Plan:  SLP goals down -graded to MOD assist due to poor recall and language of confusion. Family conference set for 02/28/20 to review care required at discharge with family.    Medical Summary Current Status: Left-sided weakness secondary to left greater than right SDH and  SAH. Weekly Focus/Goal: Improve mobility, optimize CBGs/BP, wide complex tachycardia  Barriers to Discharge: Behavior;Medical stability;Incontinence;Decreased family/caregiver support   Possible Resolutions to Barriers: Therapies, follow labs, optimize BP/DM meds, continue amiodarone with target d/c date on  6/12   Continued Need for Acute Rehabilitation Level of Care: The patient requires daily medical management by a physician with specialized training in physical medicine and rehabilitation for the following reasons: Direction of a multidisciplinary physical rehabilitation program to maximize functional independence : Yes Medical management of patient stability for increased activity during participation in an intensive rehabilitation regime.: Yes Analysis of laboratory values and/or radiology reports with any subsequent need for medication adjustment and/or medical intervention. : Yes   I attest that I was present, lead the team conference, and concur with the assessment and plan of the team.   Dorien Chihuahua B 02/27/2020, 2:44 PM

## 2020-02-27 NOTE — Plan of Care (Signed)
  Problem: RH Balance Goal: LTG Patient will maintain dynamic standing balance (PT) Description: LTG:  Patient will maintain dynamic standing balance with assistance during mobility activities (PT) Flowsheets (Taken 02/27/2020 1611) LTG: Pt will maintain dynamic standing balance during mobility activities with:: (goal DC due to slow progress) -- Note: Goal DC due to slow progress/lack of progress    Problem: Sit to Stand Goal: LTG:  Patient will perform sit to stand with assistance level (PT) Description: LTG:  Patient will perform sit to stand with assistance level (PT) Flowsheets (Taken 02/03/2020 1414 by Colvin Caroli, Irving Burton, PT) LTG: PT will perform sit to stand in preparation for functional mobility with assistance level: Moderate Assistance - Patient 50 - 74%   Problem: RH Bed Mobility Goal: LTG Patient will perform bed mobility with assist (PT) Description: LTG: Patient will perform bed mobility with assistance, with/without cues (PT). Flowsheets (Taken 02/27/2020 1611) LTG: Pt will perform bed mobility with assistance level of: (goal downgraded due to cognitive/motor planning deficits) Maximal Assistance - Patient 25 - 49% Note: Goal downgraded due to motor planning/cognitive deficits    Problem: RH Bed to Chair Transfers Goal: LTG Patient will perform bed/chair transfers w/assist (PT) Description: LTG: Patient will perform bed to chair transfers with assistance (PT). Flowsheets (Taken 02/27/2020 1611) LTG: Pt will perform Bed to Chair Transfers with assistance level: (goal downgraded due to cognitive/motor planning deficits and likely DC home) Dependent - mechanical lift Note: Goal downgraded due to cognitive/motor planning deficits and likely DC home with family    Problem: RH Car Transfers Goal: LTG Patient will perform car transfers with assist (PT) Description: LTG: Patient will perform car transfers with assistance (PT). Flowsheets (Taken 02/27/2020 1611) LTG: Pt will perform car  transfers with assist:: (goal discharged- will need medical transport home) -- Note: Goal discharged due to cognitive/motor planning/safety impairments, will need medical transport home    Problem: RH Ambulation Goal: LTG Patient will ambulate in controlled environment (PT) Description: LTG: Patient will ambulate in a controlled environment, # of feet with assistance (PT). Flowsheets (Taken 02/27/2020 1611) LTG: Pt will ambulate in controlled environ  assist needed:: (goal discharged due to motor planning and safety concerns) -- Note: Goal discharged due to motor planning and safety concerns    Problem: RH Wheelchair Mobility Goal: LTG Patient will propel w/c in controlled environment (PT) Description: LTG: Patient will propel wheelchair in controlled environment, # of feet with assist (PT) Flowsheets (Taken 02/27/2020 1611) LTG: Pt will propel w/c in controlled environ  assist needed:: (goal discharged due to cognitive/motor planning/safety concerns) -- Note: Goal discharged due to motor planning/cognitive/safety concerns  Goal: LTG Patient will propel w/c in home environment (PT) Description: LTG: Patient will propel wheelchair in home environment, # of feet with assistance (PT). Flowsheets (Taken 02/27/2020 1611) LTG: Pt will propel w/c in home environ  assist needed:: (goal discharged due to motor planning/cognitive/safety concerns) -- Note: Goal discharged due to motor planning/cognitive/safety concerns    Problem: RH Stairs Goal: LTG Patient will ambulate up and down stairs w/assist (PT) Description: LTG: Patient will ambulate up and down # of stairs with assistance (PT) Flowsheets (Taken 02/27/2020 1611) LTG: Pt will ambulate up/down stairs assist needed:: (goal discharged due to motor planning/cognitive/safety concerns) -- Note: Goal discharged due to motor planning/cognitive/safety concerns

## 2020-02-27 NOTE — Progress Notes (Signed)
Physical Therapy Weekly Progress Note  Patient Details  Name: Nicholas [name] MRN: [id number] Date of Birth: 1951/02/07  Beginning of progress report period: Feb 18, 2020 End of progress report period: February 27, 2020  Today's Date: 02/27/2020 PT Individual Time: 1455-1540 PT Individual Time Calculation (min): 45 min   Patient has met 1 of 4 short term goals.  Progress has been severely limited by cognitive deficits including reduced attention, increased processing time, poor initiation, apraxia, and performance in therapies this past week has been extremely inconsistent. He continues to require very high levels of cuing as well as well as increased time and heavy levels of physical assist (often two helpers at [facility] complete all tasks at this time.   Patient continues to demonstrate the following deficits muscle weakness, decreased cardiorespiratoy endurance, impaired timing and sequencing, abnormal tone, unbalanced muscle activation, motor apraxia, decreased coordination and decreased motor planning, decreased midline orientation, decreased attention to left and decreased motor planning, decreased initiation, decreased attention, decreased awareness, decreased problem solving, decreased safety awareness, decreased memory and delayed processing and decreased sitting balance, decreased standing balance, decreased postural control, hemiplegia and decreased balance strategies and therefore will continue to benefit from skilled PT intervention to increase functional independence with mobility.  Patient not progressing toward long term goals.  See goal revision..    PT Short Term Goals Week 3:  PT Short Term Goal 1 (Week 3): Patient to be able to complete all supine<->sit transfers with maxAx1 and use of bed features PT Short Term Goal 1 - Progress (Week 3): Not progressing PT Short Term Goal 2 (Week 3): Patient to be able to consistently perform sliding board transfers wtih Mod-MaxA of 1 and standby  of second person for safety PT Short Term Goal 2 - Progress (Week 3): Not progressing PT Short Term Goal 3 (Week 3): Patient to tolerate 3-4 minutes in standing frame PT Short Term Goal 3 - Progress (Week 3): Met PT Short Term Goal 4 (Week 3): Paitent to initiate gait training PT Short Term Goal 4 - Progress (Week 3): Not progressing Week 4:  PT Short Term Goal 1 (Week 4): STG = LTGs due to remaining LOS  Skilled Therapeutic Interventions/Progress Updates:    Patient received up in [facility], perseverating on untying and taking off his shoes, and requiring Max redirection to focus on task as well as frequent redirection during session today. Very confused and making multiple nonsensical statements not related to task at hand and often trailing off midsentence. Requires mod-maxAx2 for safe sliding board transfers to and from mat table and attempted reaching activities however limited by poor attention span and distractability today. Also continued to require maxX2 for return to bed at [facility]. Discussed current level of function as well as PT recommendations if patient were to return home vs subacute rehab with son [name]. Left patient in bed with all needs met, son providing direct supervision today.   Therapy Documentation Precautions:  Precautions Precautions: None Precaution Comments: Lt hemiparesis with Lt neglect ; watch BP Restrictions Weight Bearing Restrictions: No Other Position/Activity Restrictions: keep BP >160 General:    Pain: Pain Assessment Pain Scale: Faces Faces Pain Scale: Hurts little more Pain Type: Acute pain Pain Location: Generalized Pain Orientation: Right;Left Pain Descriptors / Indicators: Aching;Sore Pain Onset: On-going Patients Stated Pain Goal: 0 Pain Intervention(s): Repositioned;Distraction    Therapy/Group: Individual Therapy  Windell Norfolk, DPT, PN1   Supplemental Physical Therapist [place]    Pager [phone] Acute Rehab Office  [phone]

## 2020-02-27 NOTE — Progress Notes (Signed)
[place] PHYSICAL MEDICINE & REHABILITATION PROGRESS NOTE   Subjective/Complaints: Patient seen laying in bed this morning.  States he did not sleep well overnight due to leg pain. He is alert this AM.  ROS: ?Bilateral thigh pain.  Denies CP, SOB, N/V/D  Objective:   No results found. Recent Labs    02/25/20 0744  WBC 4.5  HGB 11.8*  HCT 36.5*  PLT 301   Recent Labs    02/25/20 0859  NA 139  K 3.7  CL 102  CO2 28  GLUCOSE 98  BUN 12  CREATININE 0.58*  CALCIUM 9.5    Intake/Output Summary (Last 24 hours) at 02/27/2020 0806 Last data filed at 02/27/2020 0600 Gross per 24 hour  Intake 640 ml  Output --  Net 640 ml     Physical Exam: Vital Signs Blood pressure (!) 161/96, pulse 84, temperature 98.1 F (36.7 C), temperature source Oral, resp. rate 18, height 6' (1.829 m), weight 89.8 kg, SpO2 98 %. Constitutional: No distress . Vital signs reviewed. HENT: Normocephalic.  Atraumatic. Eyes: EOMI. No discharge. Cardiovascular: No JVD. Respiratory: Normal effort.  No stridor. GI: Non-distended. Skin: Warm and dry.  Intact. Psych: Normal mood.  Normal behavior. Musc: No edema in extremities.   ?Tenderness in bilateral thighs Neuro: Alert Motor: Motor: LUE: 4 --4/5 proximal distal with apraxia LLE: 3/5 proximal distal, improving Tone difficult to assess due to patient resistance  Assessment/Plan: 1. Functional deficits secondary to [facility] which require 3+ hours per day of interdisciplinary therapy in a comprehensive inpatient rehab setting.  Physiatrist is providing close team supervision and 24 hour management of active medical problems listed below.  Physiatrist and rehab team continue to assess barriers to discharge/monitor patient progress toward functional and medical goals  Care Tool:  Bathing    Body parts bathed by patient: Right arm, Left arm, Abdomen, Right upper leg, Left upper leg, Face   Body parts bathed by helper: Front perineal area, Buttocks,  Right lower leg, Left lower leg, Chest     Bathing assist Assist Level: Moderate Assistance - Patient 50 - 74%     Upper Body Dressing/Undressing Upper body dressing   What is the patient wearing?: Pull over shirt    Upper body assist Assist Level: Maximal Assistance - Patient 25 - 49%    Lower Body Dressing/Undressing Lower body dressing      What is the patient wearing?: Incontinence brief, Pants     Lower body assist Assist for lower body dressing: Maximal Assistance - Patient 25 - 49%     Toileting Toileting    Toileting assist Assist for toileting: Dependent - Patient 0%     Transfers Chair/bed transfer  Transfers assist     Chair/bed transfer assist level: Total Assistance - Patient < 25%     Locomotion Ambulation   Ambulation assist   Ambulation activity did not occur: Safety/medical concerns          Walk 10 feet activity   Assist  Walk 10 feet activity did not occur: Safety/medical concerns        Walk 50 feet activity   Assist Walk 50 feet with 2 turns activity did not occur: Safety/medical concerns         Walk 150 feet activity   Assist Walk 150 feet activity did not occur: Safety/medical concerns         Walk 10 feet on uneven surface  activity   Assist Walk 10 feet on uneven surfaces activity  did not occur: Safety/medical concerns         Wheelchair     Assist Will patient use wheelchair at discharge?: Yes Type of Wheelchair: Manual    Wheelchair assist level: Dependent - Patient 0% Max wheelchair distance: 150 ft    Wheelchair 50 feet with 2 turns activity    Assist        Assist Level: Dependent - Patient 0%   Wheelchair 150 feet activity     Assist      Assist Level: Dependent - Patient 0%    Medical Problem List and Plan: 1.  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  Status post cerebral angiogram balloon angioplasty  Continue CIR  Bilateral  PRAFO  Team conference today to discuss current and goals and coordination of care, home and environmental barriers, and discharge planning with nursing, case manager, and therapies.  2.  Antithrombotics: -DVT/anticoagulation: Lovenox initiated 01/29/2020             -antiplatelet therapy: Aspirin 325 mg daily 3. Pain Management: Oxycodone as needed  Robaxin prn with scheduled qhs  Changed Oxycodone to Norco 7.5 mg/325 mg q4 hours per son request since been so sedated.  Muscle rub ordered on 5/27, scheduled may be used for neck low back and buttock area 4. Mood: Provide emotional support             -antipsychotic agents: N/A  Zoloft daily.   Amantadine 100 with breakfast and lunch started on 5/26 after discussion with therapies, improved after discussion with therapies. 5. Neuropsych: This patient is not capable of making decisions on his own behalf. 6. Skin/Wound Care: Routine skin checks 7. Fluids/Electrolytes/Nutrition: Routine in and outs   BMP within acceptable range on 6/7  Carb modified diet 8.  Diabetes mellitus with hyperglycemia.  Hemoglobin A1c 7.7.  Check blood sugars before meals and at bedtime.  Patient on Tresiba 35 units daily, Glucotrol 10 mg daily, Actos 45 mg daily, Janumet 50-1000 mg twice daily, NovoLog 75 units daily prior to admission.               Levemir 31U BID, decreased to 28 twice daily on 5/26, decreased to 25 BID on 5/28, decreased to 22 on 6/3, decreased to 18 BID on 6/5, decreased to 10 BID on 6/9  NovoLog 3 units 3 times daily started on 5/26, increased to 5 TID on 5/28, increased to 8 3 times daily on 6/6   CBG (last 3)  Recent Labs    02/26/20 1647 02/26/20 2117 02/27/20 0608  GLUCAP 93 273* 115*   Extremely labile with hypoglycemia on 6/9 9.  Seizure prophylaxis. Keppra 500 mg twice daily, decreased to 250 twice daily on 5/25, d/ced on 5/28 10.  Arrhythmia/wide-complex tachycardia.  Amiodarone 200 mg daily x4 weeks (~6/12).  Follow-up cardiology  services 11.  Hypertension with orthostasis.    Nimotop decreased on 6/9.  Will likely require another antihypertensive medication  TEDS and abdominal binder as needed    Vitals:   02/27/20 0102 02/27/20 0538  BP: (!) 147/93 (!) 161/96  Pulse: 79 84  Resp:  18  Temp:  98.1 F (36.7 C)  SpO2:  98%   Labile on 6/9 12.  Hyperlipidemia.  Crestor/fenofibrate 13. Mild vasospasm evident on 5/14 vascular transcranial doppler. 14. Spasticity:  Started baclofen 5mg  TID for pain relief and spasticity, d/ced on 6/2 15.  Hyponatremia: Resolved  Sodium 139 on 5/28  Continue to monitor  16. Transaminitis: Resolved  LFTs WNL on 5/28  Continue to monitor 17.  Acute blood loss anemia  Hemoglobin 11.8 on 6/7  Continue to monitor  LOS: 25 days A FACE TO FACE EVALUATION WAS PERFORMED  Nicholas [name] Lorie Phenix 02/27/2020, 8:06 AM

## 2020-02-27 NOTE — Plan of Care (Signed)
  Problem: RH Problem Solving Goal: LTG Patient will demonstrate problem solving for (SLP) Description: LTG:  Patient will demonstrate problem solving for basic/complex daily situations with cues  (SLP) Flowsheets (Taken 02/27/2020 1222) LTG Patient will demonstrate problem solving for: Maximal Assistance - Patient 25 - 49% Note: Downgraded due to slower than anticipated progress   Problem: RH Attention Goal: LTG Patient will demonstrate this level of attention during functional activites (SLP) Description: LTG:  Patient will will demonstrate this level of attention during functional activites (SLP) Flowsheets (Taken 02/27/2020 1222) LTG: Patient will demonstrate this level of attention during cognitive/linguistic activities with assistance of (SLP): Maximal Assistance - Patient 25 - 49% Note: Downgraded due to slower than anticipated progress   Problem: RH Awareness Goal: LTG: Patient will demonstrate awareness during functional activites type of (SLP) Description: LTG: Patient will demonstrate awareness during functional activites type of (SLP) Flowsheets (Taken 02/27/2020 1222) LTG: Patient will demonstrate awareness during cognitive/linguistic activities with assistance of (SLP): Maximal Assistance - Patient 25 - 49% Note: Downgraded due to slower than anticipated progress   Problem: RH Memory Goal: LTG Patient will demonstrate ability for day to day (SLP) Description: LTG:   Patient will demonstrate ability for day to day recall/carryover during cognitive/linguistic activities with assist  (SLP) Flowsheets (Taken 02/27/2020 1222) LTG: Patient will demonstrate ability for day to day recall/carryover during cognitive/linguistic activities with assist (SLP): Maximal Assistance - Patient 25 - 49% Note: Downgraded due to slower than anticipated progress  The above listed goals were downgraded due to slower than anticipated progress in CIR

## 2020-02-28 ENCOUNTER — Inpatient Hospital Stay (HOSPITAL_COMMUNITY): Payer: BC Managed Care – PPO | Admitting: Speech Pathology

## 2020-02-28 ENCOUNTER — Inpatient Hospital Stay (HOSPITAL_COMMUNITY): Payer: BC Managed Care – PPO | Admitting: Physical Therapy

## 2020-02-28 ENCOUNTER — Inpatient Hospital Stay (HOSPITAL_COMMUNITY): Payer: BC Managed Care – PPO

## 2020-02-28 DIAGNOSIS — R451 Restlessness and agitation: Secondary | ICD-10-CM

## 2020-02-28 LAB — GLUCOSE, CAPILLARY
Glucose-Capillary: 100 mg/dL — ABNORMAL HIGH (ref 70–99)
Glucose-Capillary: 149 mg/dL — ABNORMAL HIGH (ref 70–99)
Glucose-Capillary: 97 mg/dL (ref 70–99)
Glucose-Capillary: 122 mg/dL — ABNORMAL HIGH (ref 70–99)

## 2020-02-28 NOTE — Progress Notes (Signed)
Patient ID: Nicholas [name], male   DOB: 16-Dec-1950, 69 y.o.   MRN: [id number]   MOST form completed with spouse. Will have attending MD sign.

## 2020-02-28 NOTE — Progress Notes (Signed)
Patient ID: Nicholas [name], male   DOB: 1951/01/08, 69 y.o.   MRN: [id number]  Patient/Family Conference  Patient/family in attendance: Nicholas [name]- Spouse [phone]  Staff in attendance: SW- Trula Ore, PT- Baxter Hire, OT- Kendal Hymen, [place]Denny Peon  Main focus: Discharge Planning (Preparing spouse for what discharge looks like: SNF vs. Home)  Synopsis of information shared: Patient is showing inconsistent progress due to: fatigue, processing, cognition and confusion. Therapy and SW preparing spouse for what discharge looks like at home: patient level of care, DME and resources. Therapy recommending SNF follow up for patient.   SW also informed spouse of what a SNF stay for a therapy stay looks like. Provided SNF coverage, SNF locations and information about therapy at SNF. Spouse provided SW with 3 facilities ([facility], [place] and [facility]) to send referral, if she decides to not take patient home. Spouse goal is to take patient home to provide care as a family.   Recommendations for discharge home: Supervision 24/7, SN/SW/HHaide/PT/OT/ST. Patient will require: Hospital bed with  pressure alleviating mattress and bed alarm, specialty wheelchair (appointment with PT:02/28/2020), Michiel Sites Lift and ramp. Patient will require a PTAR transfer home. Patient will discharge at bed level care/tolieting: patient not recommended to use BSC, sliding board. Family will be provided and recommended to attend ALL therapy sessions and nursing education until discharge.   Barriers/concerns expressed by patient and family: Spouse finically unable to cover SNF private pay or co-payments. Preference.    Patient/family response: Spouse states she is finacially unable to pay for SNF and is not patients wishes. Spouse wishes to continue to plan to take patient home to provide care in home. Spouse is more open to SNF and has provided options for SNF. Spouse will result to SNF if facilities accepts and insurance is able to  cover.  Spouse would like to update patients MOST form- SW will assist.     Follow-up/action plans: Therapy team will continue to work with patient and family in preparation for discharge home. SW will follow up assisting spouse with SNF options and HC options. SW will also provide information to spouse about in-home PCP's. If spouse receives offer at SNF location she is interested in, we will move forward. Therapy and nursing will continue to educate family. SW will continue plan for discharge following recommendations. Spouse is aware and understands due to patient's insurance and need level of care [facility] may be difficult to receive and possible not to receive initially after discharge. SW will continue to follow up with questions and concerns.

## 2020-02-28 NOTE — Progress Notes (Addendum)
[place] PHYSICAL MEDICINE & REHABILITATION PROGRESS NOTE   Subjective/Complaints: Patient seen laying in bed this morning.  He states he slept well overnight.  Continues to have language of confusion.  ROS: Limited due to cognition  Objective:   No results found. No results for input(s): WBC, HGB, HCT, PLT in the last 72 hours. Recent Labs    02/25/20 0859  NA 139  K 3.7  CL 102  CO2 28  GLUCOSE 98  BUN 12  CREATININE 0.58*  CALCIUM 9.5    Intake/Output Summary (Last 24 hours) at 02/28/2020 0833 Last data filed at 02/28/2020 0433 Gross per 24 hour  Intake 720 ml  Output --  Net 720 ml     Physical Exam: Vital Signs Blood pressure (!) 157/94, pulse 83, temperature 97.7 F (36.5 C), temperature source Oral, resp. rate 20, height 6' (1.829 m), weight 89.8 kg, SpO2 98 %. Constitutional: No distress . Vital signs reviewed. HENT: Normocephalic.  Atraumatic. Eyes: EOMI. No discharge. Cardiovascular: No JVD. Respiratory: Normal effort.  No stridor. GI: Non-distended. Skin: Warm and dry.  Intact. Psych: Confused. Musc: No edema in extremities.  No tenderness in extremities. Neuro: Alert Motor: Motor: LUE: 4 --4/5 proximal distal with apraxia LLE: 3/5 proximal distal, stable Tone difficult to assess due to patient resistance  Assessment/Plan: 1. Functional deficits secondary to [facility] which require 3+ hours per day of interdisciplinary therapy in a comprehensive inpatient rehab setting.  Physiatrist is providing close team supervision and 24 hour management of active medical problems listed below.  Physiatrist and rehab team continue to assess barriers to discharge/monitor patient progress toward functional and medical goals  Care Tool:  Bathing    Body parts bathed by patient: Right arm, Left arm, Right upper leg, Left upper leg, Right lower leg, Left lower leg, Face   Body parts bathed by helper: Front perineal area, Buttocks, Right lower leg, Left lower leg,  Chest Body parts n/a: Chest, Abdomen, Front perineal area, Buttocks   Bathing assist Assist Level: Minimal Assistance - Patient > 75%     Upper Body Dressing/Undressing Upper body dressing   What is the patient wearing?: Pull over shirt    Upper body assist Assist Level: Moderate Assistance - Patient 50 - 74%    Lower Body Dressing/Undressing Lower body dressing      What is the patient wearing?: Pants     Lower body assist Assist for lower body dressing: Maximal Assistance - Patient 25 - 49%     Toileting Toileting    Toileting assist Assist for toileting: Dependent - Patient 0%     Transfers Chair/bed transfer  Transfers assist     Chair/bed transfer assist level: 2 Helpers     Locomotion Ambulation   Ambulation assist   Ambulation activity did not occur: Safety/medical concerns          Walk 10 feet activity   Assist  Walk 10 feet activity did not occur: Safety/medical concerns        Walk 50 feet activity   Assist Walk 50 feet with 2 turns activity did not occur: Safety/medical concerns         Walk 150 feet activity   Assist Walk 150 feet activity did not occur: Safety/medical concerns         Walk 10 feet on uneven surface  activity   Assist Walk 10 feet on uneven surfaces activity did not occur: Safety/medical concerns         Wheelchair  Assist Will patient use wheelchair at discharge?: Yes Type of Wheelchair: Manual    Wheelchair assist level: Dependent - Patient 0% Max wheelchair distance: 150 ft    Wheelchair 50 feet with 2 turns activity    Assist        Assist Level: Dependent - Patient 0%   Wheelchair 150 feet activity     Assist      Assist Level: Dependent - Patient 0%    Medical Problem List and Plan: 1.  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  Status post cerebral angiogram balloon angioplasty  Continue CIR  Bilateral PRAFO 2.   Antithrombotics: -DVT/anticoagulation: Lovenox initiated 01/29/2020             -antiplatelet therapy: Aspirin 325 mg daily 3. Pain Management: Oxycodone as needed  Robaxin prn with scheduled qhs  Changed Oxycodone to Norco 7.5 mg/325 mg q4 hours per son request since been so sedated.  Muscle rub ordered on 5/27, scheduled may be used for neck low back and buttock area 4. Mood: Provide emotional support             -antipsychotic agents: N/A  Zoloft daily.   Amantadine 100 with breakfast and lunch, decreased to 50 on 6/10 and monitor for improvement in restlessness/distraction. 5. Neuropsych: This patient is not capable of making decisions on his own behalf. 6. Skin/Wound Care: Routine skin checks 7. Fluids/Electrolytes/Nutrition: Routine in and outs   BMP within acceptable range on 6/7  Carb modified diet 8.  Diabetes mellitus with hyperglycemia.  Hemoglobin A1c 7.7.  Check blood sugars before meals and at bedtime.  Patient on Tresiba 35 units daily, Glucotrol 10 mg daily, Actos 45 mg daily, Janumet 50-1000 mg twice daily, NovoLog 75 units daily prior to admission.               Levemir 31U BID, decreased to 28 twice daily on 5/26, decreased to 25 BID on 5/28, decreased to 22 on 6/3, decreased to 18 BID on 6/5, decreased to 10 BID on 6/9  NovoLog 3 units 3 times daily started on 5/26, increased to 5 TID on 5/28, increased to 8 3 times daily on 6/6   CBG (last 3)  Recent Labs    02/27/20 1702 02/27/20 2105 02/28/20 0615  GLUCAP 146* 208* 100*   Labile on 6/10 9.  Seizure prophylaxis. Keppra 500 mg twice daily, decreased to 250 twice daily on 5/25, d/ced on 5/28 10.  Arrhythmia/wide-complex tachycardia.  Amiodarone 200 mg daily x4 weeks (~6/12).  Follow-up cardiology services 11.  Hypertension with orthostasis.    Nimotop decreased on 6/9.  Will likely require another antihypertensive medication  TEDS and abdominal binder as needed    Vitals:   02/27/20 1935 02/28/20 0430  BP:  128/63 (!) 157/94  Pulse: 81 83  Resp: 14 20  Temp: 97.8 F (36.6 C) 97.7 F (36.5 C)  SpO2: 97% 98%   Remains labile on 6/10 12.  Hyperlipidemia.  Crestor/fenofibrate 13. Mild vasospasm evident on 5/14 vascular transcranial doppler. 14. Spasticity:  Started baclofen 5mg  TID for pain relief and spasticity, d/ced on 6/2 15.  Hyponatremia: Resolved  Sodium 139 on 5/28  Continue to monitor 16. Transaminitis: Resolved  LFTs WNL on 5/28  Continue to monitor 17.  Acute blood loss anemia  Hemoglobin 11.8 on 6/7  Continue to monitor  LOS: 26 days A FACE TO FACE EVALUATION WAS PERFORMED  Nana Vastine 8/7 02/28/2020, 8:33 AM

## 2020-02-28 NOTE — Progress Notes (Signed)
Speech Language Pathology Daily Session Note  Patient Details  Name: Nicholas [name] MRN: [id number] Date of Birth: 1951/04/08  Today's Date: 02/28/2020 SLP Individual Time: 0829-0929 SLP Individual Time Calculation (min): 60 min  Short Term Goals: Week 4: SLP Short Term Goal 1 (Week 4): STG=LTG due to remaining LOS  Skilled Therapeutic Interventions: Pt was seen for skilled ST targeting cognition. Pt used memory notebook to recall events from yesterday with Mod A verbal and visual cues for visual scanning. Pt also recalled function/purpose of notebook with Mod A question cues. Pt required Max A verbal and visual cues for functional reading and interpretation of his daily therapy schedule. Although Total A required for legible writing of a 5 item grocery list, pt used it to find 5 items within a grocery ad with Max A multimodal cues for problem solving. Pt verbally recalled 2 out of 5 items from the list with Mod A for use of rehearsal/repetition strategy. Use of list as external aid required for recall of remaining 3. Pt with slight improvement in sustained attention to structured and functional tasks today - Mod A verbal and visual cues provided for redirection in 3-6 minute intervals. Pt left laying in bed with alarm set and needs within reach. Continue per current plan of care.        Pain Pain Assessment Pain Scale: 0-10 Pain Score: 0-No pain  Therapy/Group: Individual Therapy  Nicholas [name] 02/28/2020, 7:05 AM

## 2020-02-28 NOTE — Progress Notes (Signed)
Patient ID: Nicholas [name], male   DOB: 1951-01-31, 69 y.o.   MRN: [id number]   Hospital bed and hoyer lift ordered

## 2020-02-28 NOTE — Progress Notes (Addendum)
Occupational Therapy Session Note  Patient Details  Name: Nicholas [name] MRN: [id number] Date of Birth: 09-17-1951  Today's Date: 02/28/2020 OT Individual Time:  -       Short Term Goals: Week 3:  OT Short Term Goal 1 (Week 3): Pt will be able to maintain his balance at EOB with S while completing UB bathing to demonstrate improved postural control. OT Short Term Goal 2 (Week 3): Pt will be able to don a shirt with min A. OT Short Term Goal 3 (Week 3): Pt will be able to push from sit to stand with max A of 1 to prepare for clothing management over hips. OT Short Term Goal 4 (Week 3): Pt will be able to use slide board with mod A of 1 and 2nd person to stabilize the board.  Skilled Therapeutic Interventions/Progress Updates:    Pt supine in bed with wife at bedside. Pt and wife agreeable to caregiver education throughout self care this morning due to reported desire for pt to possibly discharge to home setting with wife.  Educated pt's wife on body mechanics, energy conservation, and compensatory techniques during bathing and dressing at bed level while giving didactic instruction, visual demonstration, and intermittent TCs.   Educated pts wife on cueing techniques to help facilitate increased pt participation throughout self care. Demonstrated safe pt positioning to roll left and right as needed. Pt indicating need to urinate, therefore educated pts wife on positioning of urinal.  Pt urinated successfully in urinal with max assist.  Pt bathed UB with min assist and LB with max assist at bed level.  Pt donned shirt overhead with max assist needing cues to attend to left arm and sleeve.  Pt needing max assist to donn pants with pt able to partially bridge to pull over hips.  Total assist to donn socks and shoes.  Supine to sit with max assist and max VCs and TCs. Pt completed sliding board transfer with max assist and wife to hold sliding board in place.  Pt exhibited significant posterior pushing at EOB  and during transfer despited multimodal cues provided. Seat belt alarm donned/on, call bell in reach.        Therapy Documentation Precautions:  Precautions Precautions: None Precaution Comments: Lt hemiparesis with Lt neglect ; watch BP Restrictions Weight Bearing Restrictions: No Other Position/Activity Restrictions: keep BP >160  Pain: Pain Assessment Pain Scale: Faces Faces Pain Scale: Hurts a little bit Pain Type: Chronic pain Pain Location: Generalized Pain Orientation: Right;Left Pain Descriptors / Indicators: Aching;Sore Pain Onset: On-going Patients Stated Pain Goal: 0 Pain Intervention(s): Repositioned;Distraction Multiple Pain Sites: No ADL: ADL Eating: Maximal assistance Grooming: Minimal assistance Where Assessed-Grooming: Sitting at sink Upper Body Bathing: Minimal cueing, Minimal assistance Where Assessed-Upper Body Bathing: Edge of bed Lower Body Bathing: Maximal assistance Where Assessed-Lower Body Bathing: Bed level Upper Body Dressing: Moderate assistance Where Assessed-Upper Body Dressing: Edge of bed Lower Body Dressing: Maximal assistance Where Assessed-Lower Body Dressing: Bed level Toileting: Not assessed Toilet Transfer: Not assessed Psychologist, counselling Transfer: Not assessed Film/video editor Method: Other (comment) (roll in shower chair) Astronomer: Other (comment)     Therapy/Group: Individual Therapy  Amie Critchley 02/28/2020, 4:51 PM

## 2020-02-28 NOTE — Progress Notes (Signed)
Physical Therapy Session Note  Patient Details  Name: Nicholas [name] MRN: [id number] Date of Birth: 12-16-1950  Today's Date: 02/28/2020 PT Individual Time: 1302-1400 PT Individual Time Calculation (min): 18 min   Short Term Goals: Week 4:  PT Short Term Goal 1 (Week 4): STG = LTGs due to remaining LOS  Skilled Therapeutic Interventions/Progress Updates:    Patient received in [facility]; spent majority of session on [facility] consult with Corene Cornea from [place] discussing WC options and benefits of recommended chair, as well as general PT prognosis and positioning needs, in agreement that patient will continue to need [employer] and Education officer, museum informed. Spouse reports she is feeling more open to considering SNF/further subacute rehab after meeting with therapists and social worker earlier today, is planning to research several facilities in the area (encouragd to do so by therapist) but had to leave for a personal meeting/unable to participate in family ed today. Due to limited time left in session/spouse unavailable, focused remainder of this session on sliding board transfers and sitting balance/reaching- continues to require ModAx2 for safe SBT and today needed Min-ModA to maintain sitting balance and facilitate reaching for clothes pins at edge of mat table. Left up in [facility] with all needs met, son [name] present, and seatbelt alarm active.   Therapy Documentation Precautions:  Precautions Precautions: None Precaution Comments: Lt hemiparesis with Lt neglect ; watch BP Restrictions Weight Bearing Restrictions: No Other Position/Activity Restrictions: keep BP >160 Pain: Pain Assessment Pain Scale: Faces Faces Pain Scale: Hurts a little bit Pain Type: Chronic pain Pain Location: Generalized Pain Orientation: Right;Left Pain Descriptors / Indicators: Aching;Sore Pain Onset: On-going Patients Stated Pain Goal: 0 Pain Intervention(s): Repositioned;Distraction Multiple Pain Sites: No    Therapy/Group:  Individual Therapy   Windell Norfolk, DPT, PN1   Supplemental Physical Therapist [place]    Pager [phone] Acute Rehab Office [phone]    02/28/2020, 2:41 PM

## 2020-02-29 ENCOUNTER — Inpatient Hospital Stay (HOSPITAL_COMMUNITY): Payer: BC Managed Care – PPO | Admitting: Physical Therapy

## 2020-02-29 ENCOUNTER — Inpatient Hospital Stay (HOSPITAL_COMMUNITY): Payer: BC Managed Care – PPO | Admitting: Speech Pathology

## 2020-02-29 ENCOUNTER — Inpatient Hospital Stay (HOSPITAL_COMMUNITY): Payer: BC Managed Care – PPO | Admitting: Occupational Therapy

## 2020-02-29 LAB — GLUCOSE, CAPILLARY
Glucose-Capillary: 97 mg/dL (ref 70–99)
Glucose-Capillary: 308 mg/dL — ABNORMAL HIGH (ref 70–99)
Glucose-Capillary: 208 mg/dL — ABNORMAL HIGH (ref 70–99)
Glucose-Capillary: 148 mg/dL — ABNORMAL HIGH (ref 70–99)

## 2020-02-29 MED ORDER — AMIODARONE HCL 200 MG PO TABS
200.0000 mg | ORAL_TABLET | Freq: Every day | ORAL | Status: AC
  Administered 2020-03-01: 200 mg via ORAL
  Filled 2020-02-29: qty 1

## 2020-02-29 NOTE — Progress Notes (Signed)
Occupational Therapy Weekly Progress Note  Patient Details  Name: Nicholas [name] MRN: [id number] Date of Birth: 1950-11-09  Beginning of progress report period: February 20, 2020 End of progress report period: February 29, 2020  Today's Date: 02/29/2020 OT Individual Time: 800-900      Patient has met 0 of 4 short term goals.  Pt has shown some improvement in areas of sequencing, motor processing, orientation to midline, left attention, and motor planning this week, however these improvements are intermittent and require fluctuating levels of assist and amount of cueing to achieve from day to day and even vary from different times of the same day.  Pt perseverates on tasks and has difficulty alternating attention despite max cues. Pt also appears more active but without purpose and with lack of safety awareness (I.e. pressing call bell repetitively with no requests, pulling at brief, pressing buttons on bed control without purpose) Pt continues to present with intermittent posterior pushing when EOB and leans significantly to the left when supported in w/c especially when needing to divide attention. Pt also needs consistent cues to attend to left side, and right gaze present unless max VCs provided. Pt is completing bed mobility with less level of assist needed consistently and has been able to transfer with max assist +1 with second person to manage sliding board consistently as well. Pt also has shown some signs of improved urinary continence awareness and control with one episode with OT present when he was able to indicate need to urinate and successfully use urinal with max assist.  Patient continues to demonstrate the following deficits: muscle weakness, decreased cardiorespiratoy endurance, impaired timing and sequencing, unbalanced muscle activation, motor apraxia, ataxia, decreased coordination and decreased motor planning, decreased visual acuity, decreased visual perceptual skills, decreased visual  motor skills and field cut, decreased midline orientation, decreased attention to left, left side neglect and decreased motor planning, decreased initiation, decreased attention, decreased awareness, decreased problem solving, decreased safety awareness, decreased memory and delayed processing and decreased sitting balance, decreased standing balance, decreased postural control and decreased balance strategies and therefore will continue to benefit from skilled OT intervention to enhance overall performance with BADL.  Patient progressing toward long term goals..  Continue plan of care.  OT Short Term Goals Week 3:  OT Short Term Goal 1 (Week 3): Pt will be able to maintain his balance at EOB with S while completing UB bathing to demonstrate improved postural control. OT Short Term Goal 2 (Week 3): Pt will be able to don a shirt with min A. OT Short Term Goal 3 (Week 3): Pt will be able to push from sit to stand with max A of 1 to prepare for clothing management over hips. OT Short Term Goal 4 (Week 3): Pt will be able to use slide board with mod A of 1 and 2nd person to stabilize the board.  Skilled Therapeutic Interventions/Progress Updates:    Pt supine in bed attempting to place RLE on floor over bed rail.  When OT asked pt what he would like to do during OT session today, pt responded "Id like to get on the toilet, and get some work done, and go out and buy some better food".  OT asked pt if he needed to urinate or have a bowel movement, and pt responded "I already went in this" indicating in his brief.  Pt agreeable to bed level bathing in order to provide time to get up and complete UB dressing, grooming, hygiene at  sinkside.  UB/LB bathing, and LB dressing, completed with total assist due to time constraint.  Pt completed supine to sit with mod assist today with increased appropriate pushing up with RUE and improved body mechanics.  Pt initially pushing posteriorly, however able to correct with  multimodal cues and sitting at midline without BUE support with CGA.  Pt instructed through sliding board transfer with max assist +1 with second person to manage sliding board EOB to w/c.  Pt was able to follow commands to assist using BUE in repositioning in w/c for good pelvic and spinal alignment.  Pt instructed through sequencing of donning sweater overhead needing only min assist today and mod VCs to orient to front/back and attend to left sleeve.  Pt showed more initiative today when given options and choosing next task he would like to complete.  Pt brushed teeth sitting sinkside needing min assist and max multimodal cues to sequence and discontinue task due to perseveration on task once started.  Pt combed hair and applied deoderant with min assist.  Pt noted leaning significantly to left when attending to hygiene/grooming tasks with difficulty righting self to midline despite multimodal cues and needing min assist to reposition and maintain upright posture.  Seat belt alarm donned/turned on, call bell in reach, nursing notified of pts location.    Therapy Documentation Precautions:  Precautions Precautions: None Precaution Comments: Lt hemiparesis with Lt neglect ; watch BP Restrictions Weight Bearing Restrictions: No Other Position/Activity Restrictions: keep BP >160  Pain: Pain Assessment Pain Scale: Faces Pain Score: 0-No pain Faces Pain Scale: Hurts little more Pain Type: Chronic pain Pain Location: Back Pain Orientation: Right;Left;Lower Pain Descriptors / Indicators: Sore Pain Onset: On-going Patients Stated Pain Goal: 0 Pain Intervention(s): Repositioned;Distraction ADL: ADL Eating: Maximal assistance Grooming: Minimal assistance Where Assessed-Grooming: Sitting at sink Upper Body Bathing: Minimal cueing, Minimal assistance Where Assessed-Upper Body Bathing: Edge of bed Lower Body Bathing: Maximal assistance Where Assessed-Lower Body Bathing: Bed level Upper Body  Dressing: Moderate assistance Where Assessed-Upper Body Dressing: Edge of bed Lower Body Dressing: Maximal assistance Where Assessed-Lower Body Dressing: Bed level Toileting: Not assessed Toilet Transfer: Not assessed Gaffer Transfer: Not assessed Social research officer, government Method: Other (comment) (roll in shower chair) Youth worker: Other (comment)  Therapy/Group: Individual Therapy  Ezekiel Slocumb 02/29/2020, 4:44 PM

## 2020-02-29 NOTE — Progress Notes (Signed)
[place] PHYSICAL MEDICINE & REHABILITATION PROGRESS NOTE   Subjective/Complaints: Patient seen laying in bed this morning.  He states he slept well overnight.  Persistent negative confusion.  ROS: Limited due to cognition  Objective:   No results found. No results for input(s): WBC, HGB, HCT, PLT in the last 72 hours. No results for input(s): NA, K, CL, CO2, GLUCOSE, BUN, CREATININE, CALCIUM in the last 72 hours.  Intake/Output Summary (Last 24 hours) at 02/29/2020 0824 Last data filed at 02/28/2020 1755 Gross per 24 hour  Intake 360 ml  Output 150 ml  Net 210 ml     Physical Exam: Vital Signs Blood pressure (!) 161/81, pulse 72, temperature 98.3 F (36.8 C), temperature source Oral, resp. rate 18, height 6' (1.829 m), weight 89.8 kg, SpO2 99 %. Constitutional: No distress . Vital signs reviewed. HENT: Normocephalic.  Atraumatic. Eyes: EOMI. No discharge. Cardiovascular: No JVD. Respiratory: Normal effort.  No stridor. GI: Non-distended. Skin: Warm and dry.  Intact. Psych: Confused. Musc: No edema in extremities.  No tenderness in extremities. Neuro: Alert Motor: Motor: LUE: 4 --4/5 proximal distal with apraxia LLE: 3/5 proximal distal, unchanged Tone difficult to assess due to patient resistance  Assessment/Plan: 1. Functional deficits secondary to [facility] which require 3+ hours per day of interdisciplinary therapy in a comprehensive inpatient rehab setting.  Physiatrist is providing close team supervision and 24 hour management of active medical problems listed below.  Physiatrist and rehab team continue to assess barriers to discharge/monitor patient progress toward functional and medical goals  Care Tool:  Bathing    Body parts bathed by patient: Right arm, Left arm, Right upper leg, Left upper leg, Right lower leg, Left lower leg, Face   Body parts bathed by helper: Front perineal area, Buttocks, Right lower leg, Left lower leg, Chest Body parts n/a: Chest,  Abdomen, Front perineal area, Buttocks   Bathing assist Assist Level: Minimal Assistance - Patient > 75%     Upper Body Dressing/Undressing Upper body dressing   What is the patient wearing?: Pull over shirt    Upper body assist Assist Level: Moderate Assistance - Patient 50 - 74%    Lower Body Dressing/Undressing Lower body dressing      What is the patient wearing?: Pants     Lower body assist Assist for lower body dressing: Maximal Assistance - Patient 25 - 49%     Toileting Toileting    Toileting assist Assist for toileting: Total Assistance - Patient < 25% (using urinal)     Transfers Chair/bed transfer  Transfers assist     Chair/bed transfer assist level: 2 Helpers     Locomotion Ambulation   Ambulation assist   Ambulation activity did not occur: Safety/medical concerns          Walk 10 feet activity   Assist  Walk 10 feet activity did not occur: Safety/medical concerns        Walk 50 feet activity   Assist Walk 50 feet with 2 turns activity did not occur: Safety/medical concerns         Walk 150 feet activity   Assist Walk 150 feet activity did not occur: Safety/medical concerns         Walk 10 feet on uneven surface  activity   Assist Walk 10 feet on uneven surfaces activity did not occur: Safety/medical concerns         Wheelchair     Assist Will patient use wheelchair at discharge?: Yes Type of Wheelchair:  Manual    Wheelchair assist level: Dependent - Patient 0% Max wheelchair distance: 150 ft    Wheelchair 50 feet with 2 turns activity    Assist        Assist Level: Dependent - Patient 0%   Wheelchair 150 feet activity     Assist      Assist Level: Dependent - Patient 0%    Medical Problem List and Plan: 1.  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  Status post cerebral angiogram balloon angioplasty  Continue CIR  Bilateral PRAFO 2.   Antithrombotics: -DVT/anticoagulation: Lovenox initiated 01/29/2020             -antiplatelet therapy: Aspirin 325 mg daily 3. Pain Management: Oxycodone as needed  Robaxin prn with scheduled qhs  Changed Oxycodone to Norco 7.5 mg/325 mg q4 hours per son request since been so sedated.  Muscle rub ordered on 5/27, scheduled may be used for neck low back and buttock area 4. Mood: Provide emotional support             -antipsychotic agents: N/A  Zoloft daily.   Amantadine 100 with breakfast and lunch, decreased to 50 on 6/10 and monitor for improvement in restlessness/distraction. 5. Neuropsych: This patient is not capable of making decisions on his own behalf. 6. Skin/Wound Care: Routine skin checks 7. Fluids/Electrolytes/Nutrition: Routine in and outs   BMP within acceptable range on 6/7  Carb modified diet 8.  Diabetes mellitus with hyperglycemia.  Hemoglobin A1c 7.7.  Check blood sugars before meals and at bedtime.  Patient on Tresiba 35 units daily, Glucotrol 10 mg daily, Actos 45 mg daily, Janumet 50-1000 mg twice daily, NovoLog 75 units daily prior to admission.               Levemir 31U BID, decreased to 28 twice daily on 5/26, decreased to 25 BID on 5/28, decreased to 22 on 6/3, decreased to 18 BID on 6/5, decreased to 10 BID on 6/9  NovoLog 3 units 3 times daily started on 5/26, increased to 5 TID on 5/28, increased to 8 3 times daily on 6/6   CBG (last 3)  Recent Labs    02/28/20 1636 02/28/20 2032 02/29/20 0603  GLUCAP 97 122* 97   Improving control on 6/11 9.  Seizure prophylaxis. Keppra 500 mg twice daily, decreased to 250 twice daily on 5/25, d/ced on 5/28 10.  Arrhythmia/wide-complex tachycardia.  Amiodarone 200 mg daily x4 weeks (6/12).  Follow-up cardiology services 11.  Hypertension with orthostasis.    Nimotop decreased on 6/9, DC'd on 6/11.  May require another antihypertensive medication  TEDS and abdominal binder as needed    Vitals:   02/28/20 1930 02/29/20  0509  BP: 124/64 (!) 161/81  Pulse: 83 72  Resp: 18 18  Temp: 98.7 F (37.1 C) 98.3 F (36.8 C)  SpO2: 95% 99%   Remains labile on 6/11 12.  Hyperlipidemia.  Crestor/fenofibrate 13. Mild vasospasm evident on 5/14 vascular transcranial doppler. 14. Spasticity:  Started baclofen 5mg  TID for pain relief and spasticity, d/ced on 6/2 15.  Hyponatremia: Resolved  Sodium 139 on 5/28  Continue to monitor 16. Transaminitis: Resolved  LFTs WNL on 5/28  Continue to monitor 17.  Acute blood loss anemia  Hemoglobin 11.8 on 6/7  Continue to monitor  LOS: 27 days A FACE TO FACE EVALUATION WAS PERFORMED  Shawnee Gambone 8/7 02/29/2020, 8:24 AM

## 2020-02-29 NOTE — Progress Notes (Signed)
Speech Language Pathology Daily Session Note  Patient Details  Name: ROBERTSON COLCLOUGH MRN: [id number] Date of Birth: 07/11/1951  Today's Date: 02/29/2020 SLP Individual Time: 3702-3017 SLP Individual Time Calculation (min): 28 min  Short Term Goals: Week 4: SLP Short Term Goal 1 (Week 4): STG=LTG due to remaining LOS  Skilled Therapeutic Interventions: Pt was seen for skilled ST targeting cognition. SLp provided Max A verbal and visual cues for scanning when reading and functional ue of memory notebook to recall 2 activities from earlier OT session. He required multiple choice cues to orient to time and situation, as well as to recall safety precautions (Max A cueing) in functional conversation regarding slide board transfer method. Min A verbal and visual cues provided for problme solving use of remote function on call bell. Pt demonstrated accruate use of call bell to request assistance when prompted with only Min A cues for problem solving, but Max A required to idenitfy situations in which he may need to initiate request for assistance. Pt left sitting in chair with alarm set and needs within reach. Continue per current plan of care.        Pain Pain Assessment Pain Scale: 0-10 Pain Score: 0-No pain  Therapy/Group: Individual Therapy  [name] 02/29/2020, 7:08 AM

## 2020-02-29 NOTE — Progress Notes (Signed)
Patient ID: Nicholas [name], male   DOB: July 02, 1951, 69 y.o.   MRN: [id number]  Patient declined by [facility]

## 2020-02-29 NOTE — Progress Notes (Signed)
Patient ID: Nicholas [name], male   DOB: 06/28/1951, 69 y.o.   MRN: [id number]   Patient decline by [facility] and [facility]

## 2020-02-29 NOTE — Progress Notes (Signed)
Patient ID: Nicholas [name], male   DOB: 07-02-1951, 69 y.o.   MRN: [id number]   Patient declined by interim [facility], [facility]

## 2020-02-29 NOTE — Progress Notes (Signed)
Physical Therapy Session Note  Patient Details  Name: Nicholas [name] MRN: [id number] Date of Birth: 07/18/1951  Today's Date: 02/29/2020 PT Individual Time: 1447-1535 PT Individual Time Calculation (min): 48 min   Short Term Goals: Week 4:  PT Short Term Goal 1 (Week 4): STG = LTGs due to remaining LOS  Skilled Therapeutic Interventions/Progress Updates:    Patient received up in [facility], pleasantly confused and willing to participate in session but very fidgety and with very short attention span today, required Mod cues for attention to task. Found quiet, calm area and attempted to focus attention for longer period of time playing connect 4, patient able to lean anteriorly and reach using both L and R hands and place game pieces, but quickly became internally distracted due to back pain and poor attention span. From here tolerated riding in [place] for 6-7 minutes with 50% assist from PT for pressing down with LLE, but able to attend to task without becoming externally or internally distracted today! Finished session with coordination task for L LE involving kicking static ball- strength in L quad based on this task estimated to be 2+ to 3-/5, unable to get full extension against gravity but able to consistently attend to and appropriately respond to stimulus of ball. Education provided to son regarding differences in apraxia, ability to spontaneously perform automatic movements, and being able to correctly perform tasks involved in transfers/gait given apraxia as well as poor attention/slow processing/confusion. Left up in [facility] with all needs met, seatbelt alarm active and son [name] present.   Therapy Documentation Precautions:  Precautions Precautions: None Precaution Comments: Lt hemiparesis with Lt neglect ; watch BP Restrictions Weight Bearing Restrictions: No Other Position/Activity Restrictions: keep BP >160 Pain: Pain Assessment Pain Scale: Faces Pain Score: 0-No pain Faces Pain Scale:  Hurts little more Pain Type: Chronic pain Pain Location: Back Pain Orientation: Right;Left;Lower Pain Descriptors / Indicators: Sore Pain Onset: On-going Patients Stated Pain Goal: 0 Pain Intervention(s): Repositioned;Distraction    Therapy/Group: Individual Therapy    Windell Norfolk, DPT, PN1   Supplemental Physical Therapist [place]    Pager [phone] Acute Rehab Office [phone]    02/29/2020, 4:26 PM

## 2020-03-01 ENCOUNTER — Inpatient Hospital Stay (HOSPITAL_COMMUNITY): Payer: BC Managed Care – PPO

## 2020-03-01 ENCOUNTER — Inpatient Hospital Stay (HOSPITAL_COMMUNITY): Payer: BC Managed Care – PPO | Admitting: Occupational Therapy

## 2020-03-01 LAB — CREATININE, SERUM
Creatinine, Ser: 0.66 mg/dL (ref 0.61–1.24)
GFR calc Af Amer: >60 mL/min (ref >60)
GFR calc non Af Amer: >60 mL/min (ref >60)

## 2020-03-01 LAB — GLUCOSE, CAPILLARY
Glucose-Capillary: 96 mg/dL (ref 70–99)
Glucose-Capillary: 184 mg/dL — ABNORMAL HIGH (ref 70–99)
Glucose-Capillary: 97 mg/dL (ref 70–99)
Glucose-Capillary: 147 mg/dL — ABNORMAL HIGH (ref 70–99)

## 2020-03-01 MED ORDER — PROPRANOLOL HCL 10 MG PO TABS
10.0000 mg | ORAL_TABLET | Freq: Three times a day (TID) | ORAL | Status: DC
  Administered 2020-03-01 – 2020-03-06 (×16): 10 mg via ORAL
  Filled 2020-03-01 (×16): qty 1

## 2020-03-01 NOTE — Progress Notes (Signed)
Speech Language Pathology Daily Session Note  Patient Details  Name: Nicholas [name] MRN: [id number] Date of Birth: [birth year]  Today's Date: 03/01/2020 SLP Individual Time: 0905-0920 SLP Individual Time Calculation (min): 15 min  Short Term Goals: Week 4: SLP Short Term Goal 1 (Week 4): STG=LTG due to remaining LOS  Skilled Therapeutic Interventions: Skilled ST services focused on cognitive skills. SLP facilitated orientation of place, situation and time with visual aids, pt was able to read month and date when SLP pointed directly. SLP also facilitated recall of call bell function, pt was able to press the soft call bell following max A visual cues and returned demonstration with 5 minute delay with max A visual cues. SLP attempted to facilitated safety awareness in identifying safe verse unsafe situations when presented with picture cards, however pt did not attempt to point or verbalize with max A encouragement and eyes remained mostly closed.Treatment session ended 15 minutes earlier due to fatigue. Pt was left in room with call bell within reach and bed alarm set. ST recommends to continue skilled ST services.      Pain Pain Assessment Pain Scale: 0-10 Pain Score: 0-No pain  Therapy/Group: Individual Therapy  Lillyann Ahart  [facility] 03/01/2020, 9:44 AM

## 2020-03-01 NOTE — Progress Notes (Signed)
[place] PHYSICAL MEDICINE & REHABILITATION PROGRESS NOTE   Subjective/Complaints: Pt lying in bed. Appears to have slept overnight. Restless this morning  ROS: Limited due to cognitive/behavioral    Objective:   No results found. No results for input(s): WBC, HGB, HCT, PLT in the last 72 hours. Recent Labs    03/01/20 0504  CREATININE 0.66    Intake/Output Summary (Last 24 hours) at 03/01/2020 0946 Last data filed at 02/29/2020 1900 Gross per 24 hour  Intake 354 ml  Output --  Net 354 ml     Physical Exam: Vital Signs Blood pressure (!) 148/108, pulse 86, temperature 98.5 F (36.9 C), resp. rate 18, height 6' (1.829 m), weight 89.8 kg, SpO2 97 %. Constitutional: No distress . Vital signs reviewed. HEENT: EOMI, oral membranes moist Neck: supple Cardiovascular: RRR without murmur. No JVD    Respiratory/Chest: CTA Bilaterally without wheezes or rales. Normal effort    GI/Abdomen: BS +, non-tender, non-distended Ext: no clubbing, cyanosis, or edema Psych: restless, distracted Musc: No edema in extremities.  No tenderness in extremities. Neuro: Alert Motor: RUE and RLE 4/5? Motor: LUE: 4 --4/5 proximal distal with ongoing apraxia LLE: 3/5 proximal distal, unchanged Senses pain in all 4's.   Assessment/Plan: 1. Functional deficits secondary to [facility] [employer] which require 3+ hours per day of interdisciplinary therapy in a comprehensive inpatient rehab setting.  Physiatrist is providing close team supervision and 24 hour management of active medical problems listed below.  Physiatrist and rehab team continue to assess barriers to discharge/monitor patient progress toward functional and medical goals  Care Tool:  Bathing    Body parts bathed by patient: Right arm, Left arm, Right upper leg, Left upper leg, Right lower leg, Left lower leg, Face   Body parts bathed by helper: Front perineal area, Buttocks, Right lower leg, Left lower leg, Chest Body parts n/a: Chest, Abdomen,  Front perineal area, Buttocks   Bathing assist Assist Level: Minimal Assistance - Patient > 75%     Upper Body Dressing/Undressing Upper body dressing   What is the patient wearing?: Pull over shirt    Upper body assist Assist Level: Minimal Assistance - Patient > 75%    Lower Body Dressing/Undressing Lower body dressing      What is the patient wearing?: Pants     Lower body assist Assist for lower body dressing: Maximal Assistance - Patient 25 - 49%     Toileting Toileting    Toileting assist Assist for toileting: Total Assistance - Patient < 25%     Transfers Chair/bed transfer  Transfers assist     Chair/bed transfer assist level: 2 Helpers     Locomotion Ambulation   Ambulation assist   Ambulation activity did not occur: Safety/medical concerns          Walk 10 feet activity   Assist  Walk 10 feet activity did not occur: Safety/medical concerns        Walk 50 feet activity   Assist Walk 50 feet with 2 turns activity did not occur: Safety/medical concerns         Walk 150 feet activity   Assist Walk 150 feet activity did not occur: Safety/medical concerns         Walk 10 feet on uneven surface  activity   Assist Walk 10 feet on uneven surfaces activity did not occur: Safety/medical concerns         Wheelchair     Assist Will patient use wheelchair at discharge?: Yes Type  of Wheelchair: Manual    Wheelchair assist level: Dependent - Patient 0% Max wheelchair distance: 150 ft    Wheelchair 50 feet with 2 turns activity    Assist        Assist Level: Dependent - Patient 0%   Wheelchair 150 feet activity     Assist      Assist Level: Dependent - Patient 0%    Medical Problem List and Plan: 1.  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  Status post cerebral angiogram balloon angioplasty  Continue CIR  Bilateral PRAFO 2.  Antithrombotics: -DVT/anticoagulation:  Lovenox initiated 01/29/2020             -antiplatelet therapy: Aspirin 325 mg daily 3. Pain Management: Oxycodone as needed  Robaxin prn with scheduled qhs  Changed Oxycodone to Norco 7.5 mg/325 mg q4 hours per son request since been so sedated.  Muscle rub ordered on 5/27, scheduled may be used for neck low back and buttock area 4. Mood: Provide emotional support             -antipsychotic agents: N/A  Zoloft daily.   Amantadine 100 with breakfast and lunch, decreased to 50 on 6/10 and monitor for improvement in restlessness/distraction.  6/12 still very restless. Hold amantadine and observe   -trial of propranolol for distracted/restless behavior 5. Neuropsych: This patient is not capable of making decisions on his own behalf. 6. Skin/Wound Care: Routine skin checks 7. Fluids/Electrolytes/Nutrition: Routine in and outs   BMP within acceptable range on 6/7  Carb modified diet 8.  Diabetes mellitus with hyperglycemia.  Hemoglobin A1c 7.7.  Check blood sugars before meals and at bedtime.  Patient on Tresiba 35 units daily, Glucotrol 10 mg daily, Actos 45 mg daily, Janumet 50-1000 mg twice daily, NovoLog 75 units daily prior to admission.               Levemir 31U BID, decreased to 28 twice daily on 5/26, decreased to 25 BID on 5/28, decreased to 22 on 6/3, decreased to 18 BID on 6/5, decreased to 10 BID on 6/9  NovoLog 3 units 3 times daily started on 5/26, increased to 5 TID on 5/28, increased to 8 3 times daily on 6/6   CBG (last 3)  Recent Labs    02/29/20 1621 02/29/20 2114 03/01/20 0621  GLUCAP 208* 148* 96   Sugars still labile but trending down. Consider further adjustment tomorrow if still occasionally spiking in the 200-300's.  9.  Seizure prophylaxis. Keppra 500 mg twice daily, decreased to 250 twice daily on 5/25, d/ced on 5/28 10.  Arrhythmia/wide-complex tachycardia.  Amiodarone 200 mg daily x4 weeks (6/12).  Follow-up cardiology services 11.  Hypertension with orthostasis.     Nimotop decreased on 6/9, DC'd on 6/11.  May require another antihypertensive medication  TEDS and abdominal binder as needed    Vitals:   02/29/20 1922 03/01/20 0429  BP: 115/72 (!) 148/108  Pulse: 95 86  Resp: 18 18  Temp: 98.8 F (37.1 C) 98.5 F (36.9 C)  SpO2: 98% 97%   Remains labile on 6/12  -add propranolol 10mg  tid as above 12.  Hyperlipidemia.  Crestor/fenofibrate 13. Mild vasospasm evident on 5/14 vascular transcranial doppler. 14. Spasticity:  Started baclofen 5mg  TID for pain relief and spasticity, d/ced on 6/2 15.  Hyponatremia: Resolved  Sodium 139 on 5/28  Continue to monitor 16. Transaminitis: Resolved  LFTs WNL on 5/28  Continue to monitor 17.  Acute blood loss anemia  Hemoglobin 11.8 on 6/7  Continue to monitor  LOS: 28 days A FACE TO FACE EVALUATION WAS PERFORMED  Ranelle Oyster 03/01/2020, 9:46 AM

## 2020-03-01 NOTE — Progress Notes (Signed)
Occupational Therapy Session Note  Patient Details  Name: Nicholas [name] MRN: [id number] Date of Birth: 12-16-1950  Today's Date: 03/01/2020 OT Individual Time: 1300-1340 OT Individual Time Calculation (min): 40 min   Skilled Therapeutic Interventions/Progress Updates:    Pt greeted in bed, just finished lunch. He drank his juice with supervision for slowing rate, no s/s aspiration. Next OT set him up with sanitizer for his hands as he was perseverating on wiping hands with napkin. Pt required vcs for Lt attention during this bimanual task, Min A and vcs for lotion application afterwards as well using both UEs. Pts brief was clean so next donned pants bedlevel with pt assisting to pull pants up over hips using bridge technique while Lt foot was stabilized. Total A for donning gripper socks and +2 for supine<sit. While EOB, pt required Mod-Max A for sitting balance due to posterior lean while 2nd helper set up TIS for transfer. Pt required +2 assist for placement of slideboard, Max A to scoot with facilitation for anterior weight shift and head/hips relationship, 2nd helper at this time assisted with stabilizing the slideboard. Max A for reciprocal scooting back in w/c with pt actively trying to assist but still needing max facilitation to offset posterior lean. Pt was repositioned in TIS using props to promote neutral alignment, safety belt fastened and TIS tilted back for safety and comfort. Left pt in the w/c with all needs within reach.    Therapy Documentation Precautions:  Precautions Precautions: None Precaution Comments: Lt hemiparesis with Lt neglect ; watch BP Restrictions Weight Bearing Restrictions: No Other Position/Activity Restrictions: keep BP >160 Vital Signs: Therapy Vitals Temp: 98.7 F (37.1 C) Pulse Rate: 70 Resp: 16 BP: 117/65 Patient Position (if appropriate): Sitting Oxygen Therapy SpO2: 98 % O2 Device: Room Air Pain: When pt was asked he stated "I'm checking on it."  Pt did c/o some buttocks pain when scooting across slideboard, no further c/o once he was reclined in the TIS.    ADL: ADL Eating: Maximal assistance Grooming: Minimal assistance Where Assessed-Grooming: Sitting at sink Upper Body Bathing: Minimal cueing, Minimal assistance Where Assessed-Upper Body Bathing: Edge of bed Lower Body Bathing: Maximal assistance Where Assessed-Lower Body Bathing: Bed level Upper Body Dressing: Moderate assistance Where Assessed-Upper Body Dressing: Edge of bed Lower Body Dressing: Maximal assistance Where Assessed-Lower Body Dressing: Bed level Toileting: Not assessed Toilet Transfer: Not assessed Psychologist, counselling Transfer: Not assessed Film/video editor Method: Other (comment) (roll in shower chair) Astronomer: Other (comment)      Therapy/Group: Individual Therapy  [name] A Orland Visconti 03/01/2020, 3:53 PM

## 2020-03-01 NOTE — Progress Notes (Signed)
Physical Therapy Session Note  Patient Details  Name: Nicholas [name] MRN: [id number] Date of Birth: 1951/02/12  Today's Date: 03/01/2020 PT Individual Time: 0950-1045 PT Individual Time Calculation (min): 55 min   Short Term Goals: Week 4:  PT Short Term Goal 1 (Week 4): STG = LTGs due to remaining LOS  Skilled Therapeutic Interventions/Progress Updates:    Pt supine in bed upon PT arrival, agreeable to therapy tx and denies pain at rest. Therapist donned teds and shoes total assist this session. Pt transferred to sitting EOB with +2 assist and performed slideboard transfer to the w/c this session with +2 assist, cues for techniques. Pt transported to the gym in w/c dependently. Pt worked on standing and ambulation this session within the parallel bars. Pt performed x 3 sit<>stands throughout session while within the parallel bars with max assist, cues for techniques/anterior weightshift and then upon standing facilitation for B hip extension and for L knee extension. Pt ambulated x 3 ft, x 4 ft and x 3 ft this session with max assist +2 for w/c follow, UE support on parallel bars, cues for sequencing, therapist blocking L knee during stance and facilitating hip/knee extension, pt advances his R LE during swing but requires assist to advance L LE during swing. Sit<>stand within parallel bars mod assist to adjust shorts while within parallel bars, staying flexed at hips/knees. While standing pt incontinent of bladder this session. Pt performed sit<>stand within the stedy max +2 assist while second helper changed brief and assisted with pericare. Standing within parallel bars to pull shorts over hips. Pt transported back to room at end of session and left with needs in reach and chair alarm set, tilted back in TIS w/c.   Therapy Documentation Precautions:  Precautions Precautions: None Precaution Comments: Lt hemiparesis with Lt neglect ; watch BP Restrictions Weight Bearing Restrictions: No Other  Position/Activity Restrictions: keep BP >160   Therapy/Group: Individual Therapy  Cresenciano Genre, PT, DPT, CSRS 03/01/2020, 8:06 AM

## 2020-03-02 ENCOUNTER — Inpatient Hospital Stay (HOSPITAL_COMMUNITY): Payer: BC Managed Care – PPO | Admitting: Speech Pathology

## 2020-03-02 ENCOUNTER — Inpatient Hospital Stay (HOSPITAL_COMMUNITY): Payer: BC Managed Care – PPO | Admitting: Occupational Therapy

## 2020-03-02 LAB — GLUCOSE, CAPILLARY
Glucose-Capillary: 118 mg/dL — ABNORMAL HIGH (ref 70–99)
Glucose-Capillary: 153 mg/dL — ABNORMAL HIGH (ref 70–99)
Glucose-Capillary: 202 mg/dL — ABNORMAL HIGH (ref 70–99)
Glucose-Capillary: 133 mg/dL — ABNORMAL HIGH (ref 70–99)

## 2020-03-02 NOTE — Progress Notes (Signed)
[place] PHYSICAL MEDICINE & REHABILITATION PROGRESS NOTE   Subjective/Complaints: Had a reasonable night. Still with impulsive,inappropriate behavior at times  ROS: Limited due to cognitive/behavioral    Objective:   No results found. No results for input(s): WBC, HGB, HCT, PLT in the last 72 hours. Recent Labs    03/01/20 0504  CREATININE 0.66    Intake/Output Summary (Last 24 hours) at 03/02/2020 0904 Last data filed at 03/01/2020 1700 Gross per 24 hour  Intake 418 ml  Output --  Net 418 ml     Physical Exam: Vital Signs Blood pressure (!) 181/88, pulse 70, temperature 98 F (36.7 C), temperature source Oral, resp. rate 18, height 6' (1.829 m), weight 89.8 kg, SpO2 97 %. Constitutional: No distress . Vital signs reviewed. HEENT: EOMI, oral membranes moist Neck: supple Cardiovascular: RRR without murmur. No JVD    Respiratory/Chest: CTA Bilaterally without wheezes or rales. Normal effort    GI/Abdomen: BS +, non-tender, non-distended Ext: no clubbing, cyanosis, or edema Psych: remains distracted Musc: No edema in extremities.  No tenderness in extremities. Neuro: Alert Motor: RUE and RLE 4/5? Motor: LUE: 4 --4/5 proximal distal with ongoing apraxia LLE: 3/5 proximal distal, unchanged Senses pain in all 4's.   Assessment/Plan: 1. Functional deficits secondary to [facility] which require 3+ hours per day of interdisciplinary therapy in a comprehensive inpatient rehab setting.  Physiatrist is providing close team supervision and 24 hour management of active medical problems listed below.  Physiatrist and rehab team continue to assess barriers to discharge/monitor patient progress toward functional and medical goals  Care Tool:  Bathing    Body parts bathed by patient: Right arm, Left arm, Right upper leg, Left upper leg, Right lower leg, Left lower leg, Face   Body parts bathed by helper: Front perineal area, Buttocks, Right lower leg, Left lower leg, Chest Body  parts n/a: Chest, Abdomen, Front perineal area, Buttocks   Bathing assist Assist Level: Minimal Assistance - Patient > 75%     Upper Body Dressing/Undressing Upper body dressing   What is the patient wearing?: Pull over shirt    Upper body assist Assist Level: Minimal Assistance - Patient > 75%    Lower Body Dressing/Undressing Lower body dressing      What is the patient wearing?: Pants     Lower body assist Assist for lower body dressing: Maximal Assistance - Patient 25 - 49%     Toileting Toileting    Toileting assist Assist for toileting: Total Assistance - Patient < 25%     Transfers Chair/bed transfer  Transfers assist     Chair/bed transfer assist level: 2 Helpers     Locomotion Ambulation   Ambulation assist   Ambulation activity did not occur: Safety/medical concerns  Assist level: 2 helpers Assistive device: Parallel bars Max distance: 4 ft   Walk 10 feet activity   Assist  Walk 10 feet activity did not occur: Safety/medical concerns        Walk 50 feet activity   Assist Walk 50 feet with 2 turns activity did not occur: Safety/medical concerns         Walk 150 feet activity   Assist Walk 150 feet activity did not occur: Safety/medical concerns         Walk 10 feet on uneven surface  activity   Assist Walk 10 feet on uneven surfaces activity did not occur: Safety/medical concerns         Wheelchair     Assist Will  patient use wheelchair at discharge?: Yes Type of Wheelchair: Manual    Wheelchair assist level: Dependent - Patient 0% Max wheelchair distance: 150 ft    Wheelchair 50 feet with 2 turns activity    Assist        Assist Level: Dependent - Patient 0%   Wheelchair 150 feet activity     Assist      Assist Level: Dependent - Patient 0%    Medical Problem List and Plan: 1.  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  Status post cerebral angiogram balloon  angioplasty  Continue CIR  Bilateral PRAFO 2.  Antithrombotics: -DVT/anticoagulation: Lovenox initiated 01/29/2020             -antiplatelet therapy: Aspirin 325 mg daily 3. Pain Management: Oxycodone as needed  Robaxin prn with scheduled qhs  Changed Oxycodone to Norco 7.5 mg/325 mg q4 hours per son request since been so sedated.  Muscle rub ordered on 5/27, scheduled may be used for neck low back and buttock area 4. Mood: Provide emotional support             -antipsychotic agents: N/A  Zoloft daily.   Amantadine 100 with breakfast and lunch, decreased to 50 on 6/10 and monitor for improvement in restlessness/distraction.  6/13 trial of propranolol started  6/12 for restlessness/agitation   -amantadine stopped 6/12 5. Neuropsych: This patient is not capable of making decisions on his own behalf. 6. Skin/Wound Care: Routine skin checks 7. Fluids/Electrolytes/Nutrition: Routine in and outs   BMP within acceptable range on 6/7   Carb modified diet 8.  Diabetes mellitus with hyperglycemia.  Hemoglobin A1c 7.7.  Check blood sugars before meals and at bedtime.  Patient on Tresiba 35 units daily, Glucotrol 10 mg daily, Actos 45 mg daily, Janumet 50-1000 mg twice daily, NovoLog 75 units daily prior to admission.               Levemir 31U BID, decreased to 28 twice daily on 5/26, decreased to 25 BID on 5/28, decreased to 22 on 6/3, decreased to 18 BID on 6/5, decreased to 10 BID on 6/9  NovoLog 3 units 3 times daily started on 5/26, increased to 5 TID on 5/28, increased to 8 3 times daily on 6/6   CBG (last 3)  Recent Labs    03/01/20 1645 03/01/20 2108 03/02/20 0622  GLUCAP 97 147* 118*   6/13 sugars inconsistent but improving. Continue same schedule  9.  Seizure prophylaxis. Keppra 500 mg twice daily, decreased to 250 twice daily on 5/25, d/ced on 5/28 10.  Arrhythmia/wide-complex tachycardia.  Amiodarone 200 mg daily x4 weeks (6/12).  Follow-up cardiology services 11.  Hypertension with  orthostasis.    Nimotop decreased on 6/9, DC'd on 6/11.  May require another antihypertensive medication  TEDS and abdominal binder as needed    Vitals:   03/01/20 1944 03/02/20 0619  BP: 122/66 (!) 181/88  Pulse: 77 70  Resp: 16 18  Temp: 98.1 F (36.7 C) 98 F (36.7 C)  SpO2: 99% 97%   Remains labile on 6/13  -observe with addn of propranolol 10mg  tid as above 12.  Hyperlipidemia.  Crestor/fenofibrate 13. Mild vasospasm evident on 5/14 vascular transcranial doppler. 14. Spasticity:  Started baclofen 5mg  TID for pain relief and spasticity, d/ced on 6/2 15.  Hyponatremia: Resolved  Sodium 139 on 5/28  Continue to monitor 16. Transaminitis: Resolved  LFTs WNL on 5/28  Continue to monitor 17.  Acute blood loss anemia  Hemoglobin 11.8 on 6/7  Continue to monitor  LOS: 29 days A FACE TO FACE EVALUATION WAS PERFORMED  Ranelle Oyster 03/02/2020, 9:04 AM

## 2020-03-02 NOTE — Progress Notes (Signed)
Occupational Therapy Session Note  Patient Details  Name: Nicholas [name] MRN: [id number] Date of Birth: [birth year]  Today's Date: 03/02/2020 OT Individual Time: 0800-0900 OT Individual Time Calculation (min): 60 min    Skilled Therapeutic Interventions/Progress Updates:    1:1 Pt received in bed. With [facility] elevated pt initiated moving towards EOB after command but still required max A to come to EOB due to motor planning. Pt with initiation of movement in left UE and LE during all mobility tasks today. Sit to stand in the STEDY with max A and transported into the bathroom and into shower to shower chair with back (our roll in shower chair). Pt able to bathe with min A with max cues for terminating perseverating on bathing body parts. Able to direct with tactile cues and hand over hand to redirect. Pt transferred with STEDY into TIS w/c again with max A. Pt with significant lean to the right today throughout session requiring multimodal cuing to return to midline. Pt don shirt with backwards chaining with modA. Pt don pants with max A with backwards chaining. PT was able to perform sit to stand with max A +2 2x to pull up pants. Pt able to initiated forward weight shift but needs to break down steps in order to successfully stand. Pt used left hand as a gross assist in tasks but continues to present with motor apraxia with tasks and difficulty releasing items at times. Pt engaged in self feeding himself breakfast with min A with extra time and cues for pace and bite size. Pt with difficulty with switching between items he could eat with his hands and utensils.  Left resting up in the w/c with [employer] [address] playing.   Therapy Documentation Precautions:  Precautions Precautions: None Precaution Comments: Lt hemiparesis with Lt neglect ; watch BP Restrictions Weight Bearing Restrictions: No Other Position/Activity Restrictions: keep BP >160 Pain: Pain Assessment Pain Scale: 0-10 Pain Score: 0-No  pain   Therapy/Group: Individual Therapy  Roney Mans [facility] 03/02/2020, 10:46 AM

## 2020-03-02 NOTE — Progress Notes (Signed)
Occupational Therapy Session Note  Patient Details  Name: Nicholas [name] MRN: [id number] Date of Birth: 02-28-1951  Today's Date: 03/02/2020 OT Individual Time: 1400-1445 OT Individual Time Calculation (min): 45 min    Short Term Goals: Week 2:  OT Short Term Goal 1 (Week 2): Pt will maintain static sitting balance (unsupported) with min A in prep for dyanamic sititng balance in ADL OT Short Term Goal 1 - Progress (Week 2): Met OT Short Term Goal 2 (Week 2): Pt will don shirt with mod assist. OT Short Term Goal 2 - Progress (Week 2): Met OT Short Term Goal 3 (Week 2): Pt will perform sliding board transfers with max assist +1. OT Short Term Goal 3 - Progress (Week 2): Met OT Short Term Goal 4 (Week 2): Pt will wash UB with mod assist using BUE. OT Short Term Goal 4 - Progress (Week 2): Met Week 3:  OT Short Term Goal 1 (Week 3): Pt will be able to maintain his balance at EOB with supervision while completing UB bathing to demonstrate improved postural control. OT Short Term Goal 1 - Progress (Week 3): Progressing toward goal OT Short Term Goal 2 (Week 3): Pt will be able to don a shirt with min A. OT Short Term Goal 2 - Progress (Week 3): Progressing toward goal OT Short Term Goal 3 (Week 3): Pt will be able to push from sit to stand with max A of 1 to prepare for clothing management over hips. OT Short Term Goal 3 - Progress (Week 3): Progressing toward goal OT Short Term Goal 4 (Week 3): Pt will be able to use slide board with mod A of 1 and 2nd person to stabilize the board. OT Short Term Goal 4 - Progress (Week 3): Progressing toward goal Week 4:  OT Short Term Goal 1 (Week 4): Pt will be able to right to midline sitting EOB with min assist while bathing UB. OT Short Term Goal 2 (Week 4): Pt will be able to stand with mod assist + 1 in order to pull pants over hips. OT Short Term Goal 3 (Week 4): Pt will complete sliding board transfer with mod assist and second helper to manage sliding  board. OT Short Term Goal 4 (Week 4): Pt will be able to complete oral hygiene and grooming with CGA and ability to right to midline with mod multimodal cues.  Skilled Therapeutic Interventions/Progress Updates:    1:1 NMR with focus on standing in the standing frame with varying Assist levels to achieve and maintain upright trunk posture in order to participate in bilateral hand task. Focus on ability to initiate and follow through with lateral weight shifts in both directions, follow one to two step directions, sustained to selective attention in minimally distracting environment and functional use of left hand to manipulate small building blocks and grasp and release. Pt required mod A for attention to task and to follow through while in standing position and min A for follow through with directions. Pt initially required max A to achieve and maintain midline but faded to min A.   Squat pivot back into bed with max A to the right with more than reasonable amt of time to allow for processing and motor planning. Max A to return to supine. Left resting int he bed.    Therapy Documentation Precautions:  Precautions Precautions: None Precaution Comments: Lt hemiparesis with Lt neglect ; watch BP Restrictions Weight Bearing Restrictions: No Other Position/Activity Restrictions: keep BP >160  General:   Vital Signs:  Pain: Pain Assessment Pain Scale: 0-10 Pain Score: 0-No pain   Therapy/Group: Individual Therapy  Willeen Cass [facility] 03/02/2020, 2:53 PM

## 2020-03-02 NOTE — Progress Notes (Signed)
Patient grabbed this nurses' buttocks while staff was providing care. This nurse removed patients hand and asked the patient to be respectful to staff and the patient smiled and said "ok". This nurse informed the charge nurse Asher Muir.

## 2020-03-02 NOTE — Progress Notes (Signed)
Speech Language Pathology Daily Session Note  Patient Details  Name: Nicholas [name] MRN: [id number] Date of Birth: October 04, 1950  Today's Date: 03/02/2020 SLP Individual Time: 1136-1201 SLP Individual Time Calculation (min): 25 min  Short Term Goals: Week 4: SLP Short Term Goal 1 (Week 4): STG=LTG due to remaining LOS  Skilled Therapeutic Interventions:  Pt was seen for skilled ST targeting cognitive goals.  Pt was received at nursing station due to reports of restlessness while in his room by himself.  SLP facilitated the session with a calendar task targeting goals for sustained attention and visual scanning.  Pt required max faded to min assist verbal, auditory, and visual cues to place number cards in numerical order onto a large print wall calendar.  More assistance was needed consistently to place cards in the placeholders on his left side.  Pt was able to sustain his attention to task in a quiet, minimally distracting environment for 1-3 minute intervals with mod assist verbal cues for redirection.   Once task was completed, pt ceased interacting with therapist so SLP played music (70s hits) in the attempt to improve pt participation.  Pt's affect was noted to brighten when listening to music and he was even able to remember the names of both songs the therapist played for him after a delay of ~1 minute with min assist verbal cues.  Pt was returned to nursing station at the end of today's session.  Continue per current plan of care.    Pain Pain Assessment Pain Scale: 0-10 Pain Score: 0-No pain  Therapy/Group: Individual Therapy  Nicholas [name], Melanee Spry 03/02/2020, 12:32 PM

## 2020-03-03 ENCOUNTER — Inpatient Hospital Stay (HOSPITAL_COMMUNITY): Payer: BC Managed Care – PPO | Admitting: Speech Pathology

## 2020-03-03 ENCOUNTER — Inpatient Hospital Stay (HOSPITAL_COMMUNITY): Payer: BC Managed Care – PPO | Admitting: Occupational Therapy

## 2020-03-03 ENCOUNTER — Inpatient Hospital Stay (HOSPITAL_COMMUNITY): Payer: BC Managed Care – PPO | Admitting: Physical Therapy

## 2020-03-03 LAB — GLUCOSE, CAPILLARY
Glucose-Capillary: 128 mg/dL — ABNORMAL HIGH (ref 70–99)
Glucose-Capillary: 150 mg/dL — ABNORMAL HIGH (ref 70–99)
Glucose-Capillary: 98 mg/dL (ref 70–99)
Glucose-Capillary: 58 mg/dL — ABNORMAL LOW (ref 70–99)

## 2020-03-03 MED ORDER — LISINOPRIL 2.5 MG PO TABS
2.5000 mg | ORAL_TABLET | Freq: Every day | ORAL | Status: DC
  Administered 2020-03-03: 2.5 mg via ORAL
  Filled 2020-03-03: qty 1

## 2020-03-03 NOTE — Progress Notes (Addendum)
Physical Therapy Discharge Summary (Addendum 03/05/20)  Patient Details  Name: Nicholas [name] MRN: [id number] Date of Birth: 1951/01/19  Today's Date: 03/03/2020    PATIENT DID NOT NOT DISCHARGE TO SNF AS PLANNED.  WILL CONTINUE TO SEE PATIENT QD.  LTG'S ADJUSTED.     Patient has met 1 of 3 long term goals due to functional use of  left upper extremity and left lower extremity however note that progress and outcomes have been severely affected by cognitive factors including attention, processing, general awareness, safety impairments, and poor carryover.  Patient to discharge at a wheelchair level max-total assist of 2 people .   Patient's care partner requires assistance to provide the necessary physical and cognitive assistance at discharge.  Reasons goals not met: Progress and outcomes have been severely limited by cognitive factors including attention, processing time, general awareness, safety impairments, and poor carryover, and patient has had huge amounts of inconsistency from day to day with physical and cognitive performance.   Recommendation:  Patient will benefit from ongoing skilled PT services in skilled nursing facility setting to continue to advance safe functional mobility, address ongoing impairments in gross functional mobility, strength, pregait and gait skills, attention and processing, WC mobility, and minimize fall risk.  Equipment: No equipment provided (for discharge to SNF)  Reasons for discharge: lack of progress toward goals and discharge from hospital  Patient/family agrees with progress made and goals achieved: Yes  PT Discharge Precautions/Restrictions Precautions Precautions: None Precaution Comments: Lt hemiparesis with Lt neglect ; watch BP Vital Signs Therapy Vitals Temp: 98 F (36.7 C) Pulse Rate: 83 Resp: 17 BP: 114/65 Patient Position (if appropriate): Sitting Oxygen Therapy SpO2: 98 % O2 Device: Room Air Pain Pain Assessment Pain Scale:  Faces Faces Pain Scale: Hurts a little bit Pain Type: Chronic pain Pain Location: Back Pain Orientation: Right;Left Pain Descriptors / Indicators: Discomfort Pain Onset: On-going Patients Stated Pain Goal: 0 Pain Intervention(s): Repositioned Multiple Pain Sites: No Vision/Perception  Vision - Assessment Eye Alignment: Impaired (comment) Alignment/Gaze Preference: Gaze right Tracking/Visual Pursuits: Decreased smoothness of horizontal tracking;Decreased smoothness of vertical tracking;Other (comment) Convergence: Impaired (comment) Perception Perception: Impaired Inattention/Neglect: Does not attend to left side of body;Does not attend to left visual field Praxis Praxis: Impaired Praxis Impairment Details: Motor planning;Initiation  Cognition Overall Cognitive Status: Impaired/Different from baseline Arousal/Alertness: Awake/alert Orientation Level: Oriented to person;Oriented to place Attention: Focused Focused Attention: Appears intact Sustained Attention: Impaired Selective Attention: Impaired Memory: Impaired Awareness: Impaired Problem Solving: Impaired Executive Function:  (all impaired due to lower level deficits) Self Monitoring: Impaired Behaviors: Impulsive;Restless Safety/Judgment: Impaired Comments: slow processing, restless, no safety awareness Sensation Sensation Light Touch: Impaired Detail Peripheral sensation comments: unable to accurately assess due to cognition Hot/Cold: Impaired by gross assessment Proprioception: Impaired by gross assessment Stereognosis: Impaired by gross assessment Coordination Gross Motor Movements are Fluid and Coordinated: No Fine Motor Movements are Fluid and Coordinated: No Coordination and Movement Description: coordination impaired secondary to hemiparesis, has also been having increasing ataxia LLE/LUE Finger Nose Finger Test: impaired Heel Shin Test: impaired/unable to perform due to motor apraxia Motor   Motor Motor: Hemiplegia;Abnormal tone;Abnormal postural alignment and control;Motor apraxia Motor - Skilled Clinical Observations: L hemiparesis LE>UE Motor - Discharge Observations: L hemipareiss LE>UE, ataxia, motor apraxia  Mobility Bed Mobility Bed Mobility: Rolling Right;Rolling Left;Supine to Sit;Sit to Supine Rolling Right: Moderate Assistance - Patient 50-74% Rolling Left: Moderate Assistance - Patient 50-74% Supine to Sit: 2 Helpers Sit to Supine: 2 Helpers Transfers Transfers: Sit  to Stand;Lateral/Scoot Transfers;Stand to Sit;Stand Pivot Transfers Sit to Stand: 2 Helpers Stand to Sit: 2 Helpers Stand Pivot Transfers: 2 Helpers Lateral/Scoot Transfers: 2 Press photographer (Assistive device): Other (Comment) (sliding board) Locomotion  Gait Ambulation: Yes Gait Assistance: 2 Helpers Gait Distance (Feet): 4 Feet Assistive device: Parallel bars Gait Assistance Details: Verbal cues for sequencing;Verbal cues for technique;Verbal cues for precautions/safety;Verbal cues for gait pattern;Manual facilitation for weight shifting;Manual facilitation for placement;Manual facilitation for weight bearing Gait Gait: Yes Gait Pattern: Impaired Gait velocity: decreased Stairs / Additional Locomotion Stairs: No Wheelchair Mobility Wheelchair Mobility: Yes Wheelchair Assistance: Dependent - Patient 0% Wheelchair Parts Management: Needs assistance Distance: dependent for all distances  Trunk/Postural Assessment  Cervical Assessment Cervical Assessment: Exceptions to [facility] (forward head and head rests to R) Thoracic Assessment Thoracic Assessment: Exceptions to [facility] (forward flexed, flexed to R/elongated on L) Lumbar Assessment Lumbar Assessment: Exceptions to [facility] (posterior pelvic tilt) Postural Control Trunk Control: poor; falls forward and to the left; also pushes toward the left Righting Reactions: delayed/ absent Protective Responses: absent  Balance Balance Balance Assessed:  Yes Static Sitting Balance Static Sitting - Level of Assistance: 3: Mod assist Dynamic Sitting Balance Dynamic Sitting - Level of Assistance: 2: Max assist Sitting balance - Comments: fluctuates depending on the day- can need MaxA due to forward and left lean/push Static Standing Balance Static Standing - Level of Assistance: 1: +2 Total assist Dynamic Standing Balance Dynamic Standing - Level of Assistance: 1: +2 Total assist Extremity Assessment  RUE Assessment RUE Assessment: Within Functional Limits General Strength Comments: apraxia, difficult to assess MMT due to cognition LUE Assessment LUE Assessment: Exceptions to [facility] General Strength Comments: apraxia, difficult to assess MMT due to cognition, hemiparetic RLE Assessment RLE Assessment: Within Functional Limits General Strength Comments: apraxia, difficult to assess MMT due to cognition LLE Assessment LLE Assessment: Exceptions to [facility] General Strength Comments: apraxia, difficult to assess MMT due to cognition; esimate based on functional strength 2+/5 to 3-/5 grossly.    Windell Norfolk, DPT, PN1   Supplemental Physical Therapist [facility]    Pager [phone] Acute Rehab Office [phone]

## 2020-03-03 NOTE — Progress Notes (Signed)
Patient ID: Nicholas [name], male   DOB: 01-31-1951, 69 y.o.   MRN: [id number]   SW followed up with [facility] for Spouse. Admission director Ander Slade will be reaching out to spouse to provide information about the facility.

## 2020-03-03 NOTE — Progress Notes (Signed)
Patient ID: Nicholas [name], male   DOB: 1951/07/23, 69 y.o.   MRN: [id number]   SW received message to continue setting up patient for discharge home tomorrow on June 15th

## 2020-03-03 NOTE — Progress Notes (Signed)
Patient ID: Nicholas [name], male   DOB: February 18, 1951, 69 y.o.   MRN: [id number]   PTAR transfer for tomorrow 03/03/20 cancelled.

## 2020-03-03 NOTE — Progress Notes (Signed)
Physical Therapy Session Note  Patient Details  Name: Nicholas [name] MRN: [id number] Date of Birth: Jan 02, 1951  Today's Date: 03/03/2020 PT Individual Time: 0800-0857 PT Individual Time Calculation (min): 57 min   Short Term Goals: Week 4:  PT Short Term Goal 1 (Week 4): STG = LTGs due to remaining LOS  Skilled Therapeutic Interventions/Progress Updates:    Patient received in bed, A&O to self and location only and reoriented as necessary and repeatedly during session. Checked brief and found to be dry, then needed totalA for donning shorts and ModA for rolling side to side in bed for donning of pants. Needed totalAx2 for bed mobility due to strong posterior lean and resisting all mobility today, max cues and max-totalAx2 to remain upright at EOB without sliding off the EOB. MaxAx2 for sliding board transfer into TIS WC. Depending on WC mobility today, as per usual. Attempted to engage in kicking ball with L LE for NMR/strength training but limited due to poor initiation and processing, also attempted to use dynavision for visual scanning but unable to get him to engage in this task either. Left up in TIS WC at nurses station with all needs met, seatbelt alarm active this morning.   Therapy Documentation Precautions:  Precautions Precautions: None Precaution Comments: Lt hemiparesis with Lt neglect ; watch BP Restrictions Weight Bearing Restrictions: No Other Position/Activity Restrictions: keep BP >160   Pain: Pain Assessment Pain Scale: Faces Pain Score: 0-No pain Faces Pain Scale: Hurts a little bit Pain Type: Chronic pain Pain Location: Back Pain Orientation: Right;Left Pain Descriptors / Indicators: Discomfort Pain Onset: On-going Patients Stated Pain Goal: 0 Pain Intervention(s): Repositioned Multiple Pain Sites: No    Therapy/Group: Individual Therapy   Windell Norfolk, DPT, PN1   Supplemental Physical Therapist [place]    Pager [phone] Acute Rehab Office  [phone]    03/03/2020, 12:08 PM

## 2020-03-03 NOTE — Progress Notes (Signed)
Speech Language Pathology Daily Session Note  Patient Details  Name: Nicholas [name] MRN: [id number] Date of Birth: 11/11/1950  Today's Date: 03/03/2020 SLP Individual Time: 1131-1158 SLP Individual Time Calculation (min): 27 min  Short Term Goals: Week 4: SLP Short Term Goal 1 (Week 4): STG=LTG due to remaining LOS  Skilled Therapeutic Interventions: Pt was seen for skilled ST targeting final education session with pt and his wife. SLP and pt's wife discussed d/c plans, and follow up recommendations. Also discussed impact of pt's processing, working and short term memory, and initiation on speech output as well as language of confusion and how to provide feedback/redirect pt to more functional topics and tasks. Handout and explanation of compensatory memory strategies provided; encouraged pt's wife to continue memory notebook as external aid, minimize distractions, and establish a routine to optimize memory function and facilitated greatest chance for carryover. SLP provided Max A multimodal cues for use of memory notebook for pt to verbally recall PT name and 1 activity targeted this morning. Pt left sitting in chair with alarm set, wife still present, all questions answered and needs met to their satisfaction. Continue per current plan of care.          Pain Pain Assessment Pain Scale: 0-10 Pain Score: Asleep  Therapy/Group: Individual Therapy  Arbutus Leas 03/03/2020, 7:09 AM

## 2020-03-03 NOTE — Discharge Summary (Addendum)
Physician Discharge Summary  Patient ID: Nicholas [name] MRN: [id number] DOB/AGE: 1950/11/05 69 y.o.  Admit date: 02/02/2020 Discharge date: 03/07/2020  Discharge Diagnoses:  Principal Problem:   SDH (subdural hematoma) (HCC) Active Problems:   SAH (subarachnoid hemorrhage) (HCC)   Acute blood loss anemia   Transaminitis   Pressure injury of skin   Orthostasis   Spastic hemiparesis (HCC)   Benign essential HTN   Myalgia   Restlessness    Discharged Condition: stable   Significant Diagnostic Studies: DG Knee Right Port  Result Date: 02/09/2020 CLINICAL DATA:  Chronic knee pain EXAM: PORTABLE RIGHT KNEE - 1-2 VIEW COMPARISON:  None. FINDINGS: No evidence of fracture, dislocation, or joint effusion. No evidence of arthropathy or other focal bone abnormality. Soft tissues are unremarkable. IMPRESSION: No acute abnormality noted. Electronically Signed   By: Alcide Clever M.D.   On: 02/09/2020 14:37     Labs:  Basic Metabolic Panel: BMP Latest Ref Rng & Units 03/01/2020 02/25/2020 02/16/2020  Glucose 70 - 99 mg/dL - 98 -  BUN 8 - 23 mg/dL - 12 -  Creatinine 7.62 - 1.24 mg/dL 8.31 5.17(O) 1.60  Sodium 135 - 145 mmol/L - 139 -  Potassium 3.5 - 5.1 mmol/L - 3.7 -  Chloride 98 - 111 mmol/L - 102 -  CO2 22 - 32 mmol/L - 28 -  Calcium 8.9 - 10.3 mg/dL - 9.5 -    CBC: CBC Latest Ref Rng & Units 02/25/2020 02/20/2020 02/12/2020  WBC 4.0 - 10.5 K/uL 4.5 4.0 6.0  Hemoglobin 13.0 - 17.0 g/dL 11.8(L) 11.0(L) 11.6(L)  Hematocrit 39 - 52 % 36.5(L) 33.8(L) 35.0(L)  Platelets 150 - 400 K/uL 301 239 294    CBG: Recent Labs  Lab 03/06/20 2107 03/07/20 0557 03/07/20 1235 03/07/20 1625 03/07/20 2058  GLUCAP 275* 127* 222* 271* 242*    Brief HPI:   Nicholas [name] is a 69 y.o. male with history of HTN, T2DM, hepatitis, BPH who was admitted on 01/14/2020 with nausea, headache and left-sided weakness.  CTA of head and neck showed large volume acute SAH in basilar cisterns extending into sylvian  fissure bilaterally -right greater than left as well as interhemispheric subarachnoid hemorrhage.  Patient underwent cerebral angiogram with balloon angioplasty of right internal carotid artery, right-MCA and balloon angioplasty of basilar artery.  Keppra initiated for seizure prophylaxis.  He was started on Nimotop for blood pressure as well as vasospasm with recommendations to continue for 21 days.  Cardiology was consulted for input on wide-complex tachycardia and amiodarone added at 200 mg daily.  Follow-up MRI showed mild increase in left subdural hematoma and smaller right-sided SDH had regressed with associated midline shift.  Patient was cleared to initiate Lovenox for DVT prophylaxis as well as low-dose ASA.  Palliative care consulted for goals of care and patient is full code.  He has been tolerating regular diet.  Therapy evaluations completed revealing functional decline and CIR was recommended for follow-up therapy   Hospital Course: Nicholas [name] was admitted to rehab 02/02/2020 for inpatient therapies to consist of PT, ST and OT at least three hours five days a week. Past admission physiatrist, therapy team and rehab RN have worked together to provide customized collaborative inpatient rehab. He continues on ASA for secondary stroke prevention.  Follow-up CBC shows acute blood loss anemia is improving and reactive leukocytosis has resolved. Serial check of BMET showed that lytes and renal status is WNL. His blood pressures were monitored on TID basis  and Nimotop has been weaned off.  Orthostatic hypotension was treated with use of abdominal binder and TEDS.  Blood pressures have started  started trending upwards therefore lisinopril was added and titrated to 7.5 mg on 06/15. His diabetes has been monitored with ac/hs CBG checks with SSI on prn for elevated BS.  Levemir and meal coverage has been adjusted to help prevent hypoglycemic episodes.  Recommend continue monitoring blood sugars ac/hs and  continue to further titrate insulin as indicated.   Keppra has been weaned off by 05/28 and he has been seizure free during his stay. Ego support has been provided by team.  Amiodarone 200 mg X 4 weeks completed and was d/c by 5/28. Zoloft was added for mood stabilization.  Inderal was started on 6/13 to help with restlessness and distraction.  Neck and low back pain has been managed with as needed use of tylenol as well as muscle rub for local measures. He continues to be limited by left hemiparesis with left neglect, apraxia and cognitive deficits affecting overall functional status.  He has made slow progress and currently requires max to total assist. Wife is unable to provide care needed and SNF recommended for follow up therapy. He was discharged to [facility] on 03/04/20.     Rehab course: During patient's stay in rehab weekly team conferences were held to monitor patient's progress, set goals and discuss barriers to discharge. At admission, patient required total assist with mobility and max to total assist with ADL task.  He was mostly nonverbal with intermittent delayed phrases and was able to intermittently respond to basic biographical yes/no questions with max verbal cues.  Swallow function was intact without signs of dysphagia. He  has had improvement in activity tolerance, balance, postural control as well as ability to compensate for deficits.  He requires max verbal and tactile cues for grooming and max to total assist for dressing tasks. He requires max assist X 2 for SB transfers.  He continues to have language of confusion with short-term memory deficits affecting processing and attention. He requires max multimodal cues for use of memory notebook for recall.     Disposition:  Skilled Nursing Facility  Diet: Carb Modified.   Special Instructions: 1. Avoid QTC prolongation medications.   Discharge Instructions    Ambulatory referral to Physical Medicine Rehab   Complete  by: As directed    Moderate complexity follow-up 1 to 2 weeks left greater than right subdural hematoma     Allergies as of 03/07/2020   No Known Allergies     Medication List    STOP taking these medications   Aspirin Adult Low Strength 81 MG EC tablet Generic drug: aspirin Replaced by: aspirin 325 MG tablet   glipiZIDE 10 MG 24 hr tablet Commonly known as: GLUCOTROL XL   insulin aspart 100 UNIT/ML FlexPen Commonly known as: NOVOLOG Replaced by: insulin aspart 100 UNIT/ML injection   omega-3 acid ethyl esters 1 g capsule Commonly known as: Lovaza   pioglitazone 45 MG tablet Commonly known as: ACTOS   sitaGLIPtin-metformin 50-1000 MG tablet Commonly known as: JANUMET   Xigduo XR 06-999 MG Tb24 Generic drug: Dapagliflozin-metFORMIN HCl ER     TAKE these medications   amantadine 50 MG/5ML solution Commonly known as: SYMMETREL Take 10 mLs (100 mg total) by mouth 2 (two) times daily. Start taking on: March 08, 2020   aspirin 325 MG tablet Take 1 tablet (325 mg total) by mouth daily. Replaces: Aspirin Adult Low  Strength 81 MG EC tablet   cholecalciferol 25 MCG (1000 UNIT) tablet Commonly known as: VITAMIN D3 Take 1,000 Units by mouth daily.   Crestor 40 MG tablet Generic drug: rosuvastatin Take 1 tablet (40 mg total) by mouth daily.   fenofibrate 160 MG tablet Take 1 tablet (160 mg total) by mouth daily.   insulin aspart 100 UNIT/ML injection Commonly known as: novoLOG Inject 5 Units into the skin 3 (three) times daily with meals. Replaces: insulin aspart 100 UNIT/ML FlexPen   insulin detemir 100 UNIT/ML injection Commonly known as: LEVEMIR Inject 0.1 mLs (10 Units total) into the skin 2 (two) times daily. What changed:   how much to take  when to take this  additional instructions   lisinopril 5 MG tablet Commonly known as: ZESTRIL Take 3 tablets (15 mg total) by mouth at bedtime. Start taking on: March 08, 2020 What changed:   medication  strength  how much to take  when to take this   Muscle Rub 10-15 % Crea Apply 1 application topically 2 (two) times daily. To neck, lumbar and buttock area.   propranolol 10 MG tablet Commonly known as: INDERAL Take 3 tablets (30 mg total) by mouth 3 (three) times daily. Start taking on: March 08, 2020   senna-docusate 8.6-50 MG tablet Commonly known as: Senokot-S Take 1 tablet by mouth 2 (two) times daily.   sertraline 25 MG tablet Commonly known as: ZOLOFT Take 1 tablet (25 mg total) by mouth daily.   simethicone 80 MG chewable tablet Commonly known as: MYLICON Chew 2 tablets (160 mg total) by mouth 4 (four) times daily as needed for flatulence.        Follow-up Information    Jamse Arn, MD Follow up.   Specialty: Physical Medicine and Rehabilitation Why: Office to call for appointment Contact information: [address] [place] [address] [place] [address] [phone]        Pixie Casino, MD Follow up.   Specialty: Cardiology Why: Call for appointment Contact information: [address] [place] [address] [phone]               Signed: Bary Leriche 03/07/2020, 10:36 PM Patient was seen, face-face, and physical exam performed by me on day of discharge, greater than 30 minutes of total time spent.. Please see progress note from day of discharge as well.  Delice Lesch, MD, ABPMR

## 2020-03-03 NOTE — Progress Notes (Addendum)
Patient ID: Nicholas [name], male   DOB: February 23, 1951, 69 y.o.   MRN: [id number]   Sw just received notification from spouse that she would like patient discharge to SNF [facility].) Mr. Nicholas [name] and therapy notified. Spouse understanding that patient will not receive therapy while awaiting SNF transfer.

## 2020-03-03 NOTE — Progress Notes (Addendum)
Speech Language Pathology Discharge Summary  Patient Details  Name: Nicholas [name] MRN: [id number] Date of Birth: December 03, 1950  *Addendum 03/05/20* Pt did not discharge to SNF as planned yesterday (03/04/20). ST will continue to treat pt QD while waiting for placement at next venue of care. LTGs have been adjusted appropriately.  Patient has met 7 of 7 long term goals.  Patient to discharge at overall Max;Min level.  Reasons goals not met: n/a   Clinical Impression/Discharge Summary:   [name] made minimal slow gains during his admission, and as a result most treatment goals were downgraded. Pt ultimately met 7 out of 7 long term goals this admission. Pt currently requires Max assist for basic cognitive tasks due to impairments impacting his sustained attention, problem solving, intellectual and emergent awareness, and short term memory. He also demonstrates language of confusion, disorientation to time and occasionally to place and situation. His processing speed is delayed, requiring extra time and sometimes repetition to provide responses and follow directions. However, pt demonstrates improved verbal initiation since admission, as well as total verbal output and response time to staff and therapists' questions. Due to severity of cognitive deficits, carryover of new learning has been challenging for [name] during this admission. As a result, pt will require strict 24/7 supervision and hands on care upon d/c. Therapy team (including ST) recommends SNF as safest d/c plan and next venue of care - pt's family is now in agreement with this recommendation and pt is set to d/c to SNF today 03/04/20. Recommend pt continue to receive skilled ST services at SNF upon discharge. Pt and family education has been ongoing, as they have been present for nearly every ST session, and is complete at this time.    Care Partner:  Caregiver Able to Provide Assistance: Yes  Type of Caregiver Assistance: Cognitive  Recommendation:   Home Health SLP;24 hour supervision/assistance;Skilled Nursing facility  Rationale for SLP Follow Up: Maximize cognitive function and independence;Reduce caregiver burden;Maximize functional communication   Equipment: none   Reasons for discharge: Discharged from hospital   Patient/Family Agrees with Progress Made and Goals Achieved: Yes    Arbutus Leas 03/04/2020, 12:43 PM

## 2020-03-03 NOTE — Progress Notes (Addendum)
[place] PHYSICAL MEDICINE & REHABILITATION PROGRESS NOTE   Subjective/Complaints: Patient seen laying in bed this morning.  He states he slept well overnight.  No reported issues.  ROS: Limited due to cognition     Objective:   No results found. No results for input(s): WBC, HGB, HCT, PLT in the last 72 hours. Recent Labs    03/01/20 0504  CREATININE 0.66    Intake/Output Summary (Last 24 hours) at 03/03/2020 0827 Last data filed at 03/03/2020 0711 Gross per 24 hour  Intake 1062 ml  Output 200 ml  Net 862 ml     Physical Exam: Vital Signs Blood pressure (!) 186/93, pulse 72, temperature 98.3 F (36.8 C), temperature source Oral, resp. rate 16, height 6' (1.829 m), weight 89.8 kg, SpO2 99 %. Constitutional: No distress . Vital signs reviewed. HENT: Normocephalic.  Atraumatic. Eyes: EOMI. No discharge. Cardiovascular: No JVD. Respiratory: Normal effort.  No stridor. GI: Non-distended. Skin: Warm and dry.  Intact. Psych: Limited due to mentation.  Confused. Musc: No edema in extremities.  No tenderness in extremities. Neuro: Alert Motor:  Motor: LUE: 4 --4/5 proximal distal with apraxia, unchanged LLE: 3/5 proximal distal, unchanged  Assessment/Plan: 1. Functional deficits secondary to [facility] which require 3+ hours per day of interdisciplinary therapy in a comprehensive inpatient rehab setting.  Physiatrist is providing close team supervision and 24 hour management of active medical problems listed below.  Physiatrist and rehab team continue to assess barriers to discharge/monitor patient progress toward functional and medical goals  Care Tool:  Bathing    Body parts bathed by patient: Right arm, Left arm, Chest, Abdomen, Front perineal area, Right upper leg, Left upper leg, Face   Body parts bathed by helper: Buttocks, Right lower leg, Left lower leg Body parts n/a: Chest, Abdomen, Front perineal area, Buttocks   Bathing assist Assist Level: Minimal Assistance  - Patient > 75%     Upper Body Dressing/Undressing Upper body dressing   What is the patient wearing?: Pull over shirt    Upper body assist Assist Level: Moderate Assistance - Patient 50 - 74%    Lower Body Dressing/Undressing Lower body dressing      What is the patient wearing?: Pants (max A and +2 for sit to stands)     Lower body assist Assist for lower body dressing: 2 Helpers     Toileting Toileting Toileting Activity did not occur (Clothing management and hygiene only): N/A (no void or bm)  Toileting assist Assist for toileting: Total Assistance - Patient < 25%     Transfers Chair/bed transfer  Transfers assist     Chair/bed transfer assist level: Dependent - mechanical lift (STEDY)     Locomotion Ambulation   Ambulation assist   Ambulation activity did not occur: Safety/medical concerns  Assist level: 2 helpers Assistive device: Parallel bars Max distance: 4 ft   Walk 10 feet activity   Assist  Walk 10 feet activity did not occur: Safety/medical concerns        Walk 50 feet activity   Assist Walk 50 feet with 2 turns activity did not occur: Safety/medical concerns         Walk 150 feet activity   Assist Walk 150 feet activity did not occur: Safety/medical concerns         Walk 10 feet on uneven surface  activity   Assist Walk 10 feet on uneven surfaces activity did not occur: Safety/medical concerns         Wheelchair  Assist Will patient use wheelchair at discharge?: Yes Type of Wheelchair: Manual    Wheelchair assist level: Dependent - Patient 0% Max wheelchair distance: 150 ft    Wheelchair 50 feet with 2 turns activity    Assist        Assist Level: Dependent - Patient 0%   Wheelchair 150 feet activity     Assist      Assist Level: Dependent - Patient 0%    Medical Problem List and Plan: 1.  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  Status post cerebral  angiogram balloon angioplasty  Continue CIR  Bilateral PRAFO 2.  Antithrombotics: -DVT/anticoagulation: Lovenox initiated 01/29/2020             -antiplatelet therapy: Aspirin 325 mg daily 3. Pain Management: Oxycodone as needed  Robaxin prn with scheduled qhs  Changed Oxycodone to Norco 7.5 mg/325 mg q4 hours per son request since been so sedated.  Muscle rub ordered on 5/27, scheduled may be used for neck low back and buttock area  Controlled on 6/14 4. Mood: Provide emotional support             -antipsychotic agents: N/A  Zoloft daily.   Amantadine 100 with breakfast and lunch, decreased to 50 on 6/10 and monitor for improvement in restlessness/distraction, DC'd on 6/12.  Propranolol started on 6/13 5. Neuropsych: This patient is not capable of making decisions on his own behalf. 6. Skin/Wound Care: Routine skin checks 7. Fluids/Electrolytes/Nutrition: Routine in and outs   BMP within acceptable range on 6/7   Carb modified diet 8.  Diabetes mellitus with hyperglycemia.  Hemoglobin A1c 7.7.  Check blood sugars before meals and at bedtime.  Patient on Tresiba 35 units daily, Glucotrol 10 mg daily, Actos 45 mg daily, Janumet 50-1000 mg twice daily, NovoLog 75 units daily prior to admission.               Levemir 31U BID, decreased to 28 twice daily on 5/26, decreased to 25 BID on 5/28, decreased to 22 on 6/3, decreased to 18 BID on 6/5, decreased to 10 BID on 6/9  NovoLog 3 units 3 times daily started on 5/26, increased to 5 TID on 5/28, increased to 8 3 times daily on 6/6   CBG (last 3)  Recent Labs    03/02/20 1642 03/02/20 2058 03/03/20 0601  GLUCAP 202* 133* 128*   Slightly labile on 6/14 9.  Seizure prophylaxis. Keppra 500 mg twice daily, decreased to 250 twice daily on 5/25, d/ced on 5/28 10.  Arrhythmia/wide-complex tachycardia.  Amiodarone 200 mg daily x4 weeks, completed on 6/12.  Follow-up cardiology services 11.  Hypertension with orthostasis.    Nimotop decreased on  6/9, DC'd on 6/11.  May require another antihypertensive medication  TEDS and abdominal binder as needed  Lisinopril 2.5 nightly started on 6/14   Vitals:   03/02/20 1954 03/03/20 0553  BP: (!) 123/53 (!) 186/93  Pulse: 76 72  Resp: 16 16  Temp: 98.7 F (37.1 C) 98.3 F (36.8 C)  SpO2: 98% 99%   Extremely labile with hypertensive crisis on 6/14 12.  Hyperlipidemia.  Crestor/fenofibrate 13. Mild vasospasm evident on 5/14 vascular transcranial doppler. 14. Spasticity:  Started baclofen 5mg  TID for pain relief and spasticity, d/ced on 6/2 15.  Hyponatremia: Resolved  Sodium 139 on 5/28  Continue to monitor 16. Transaminitis: Resolved  LFTs WNL on 5/28  Continue to monitor 17.  Acute blood loss anemia  Hemoglobin 11.8 on 6/7  Continue to monitor  LOS: 30 days A FACE TO FACE EVALUATION WAS PERFORMED  Annlouise Gerety Lorie Phenix 03/03/2020, 8:27 AM

## 2020-03-03 NOTE — Progress Notes (Signed)
Occupational Therapy Session Note  Patient Details  Name: FYNN VANBLARCOM MRN: [id number] Date of Birth: 02-Nov-1950  Today's Date: 03/03/2020 OT Individual Time: 1001-1105 OT Individual Time Calculation (min): 64 min    Short Term Goals: Week 4:  OT Short Term Goal 1 (Week 4): Pt will be able to right to midline sitting EOB with min assist while bathing UB. OT Short Term Goal 2 (Week 4): Pt will be able to stand with mod assist + 1 in order to pull pants over hips. OT Short Term Goal 3 (Week 4): Pt will complete sliding board transfer with mod assist and second helper to manage sliding board. OT Short Term Goal 4 (Week 4): Pt will be able to complete oral hygiene and grooming with CGA and ability to right to midline with mod multimodal cues.  Skilled Therapeutic Interventions/Progress Updates:    Pt sitting up in w/c agreeable to participating with skilled OT.  Pt reported no pain throughout session.  Pt completed oral hygiene sitting sinkside with multimodal cues to discontinue after reasonable completion of task achieved due to pt perseveration.  Pt attempting to pick up toothbrush again and initating combing hair with item.  OT provided max verbal and tactile cues to redirect and grasp comb to complete task.  Pt next donned shirt with mod assist to thread over left arm and to pull down fully over back and stomach needing increased cueing to attend to task today.  Pt attempted to pull shirt back off over head needing VCs and TCs to orient pt to correct sequence of task and discontinue taking shirt back off.  Pt completed sit<>stand in [place] with + 2 assist (pt 50%) to maintain upright posture in order to pull pants off and donn clean pants with max assist for clothing mgt.  Total assist to donn shoes.  Pts wife arrived midway through sessio reporting dc to home may still be an option.  Educated pts wife on use of hoyer mechanical lift to transfer pt safely from bed<>chair. Pts wife needing max VCs and  visual demo and min assist to safely apply hoyer pad, connect pad to hoyer lift, and raise pt with lift to from tilt in space w/c.  Pt in w/c with seat belt alarm donned/on, call bell in reach at end of OT session.    Therapy Documentation Precautions:  Precautions Precautions: None Precaution Comments: Lt hemiparesis with Lt neglect ; watch BP Restrictions Weight Bearing Restrictions: No Other Position/Activity Restrictions: keep BP >160     Therapy/Group: Individual Therapy  Amie Critchley 03/03/2020, 3:13 PM

## 2020-03-03 NOTE — Progress Notes (Signed)
Patient ID: Nicholas [name], male   DOB: Mar 01, 1951, 69 y.o.   MRN: [id number]   Patient PTAR scheduled for 12:00 PM on 03/04/2020. SW will prepare packet to leave at nurses station.

## 2020-03-04 LAB — GLUCOSE, CAPILLARY
Glucose-Capillary: 107 mg/dL — ABNORMAL HIGH (ref 70–99)
Glucose-Capillary: 165 mg/dL — ABNORMAL HIGH (ref 70–99)
Glucose-Capillary: 140 mg/dL — ABNORMAL HIGH (ref 70–99)
Glucose-Capillary: 95 mg/dL (ref 70–99)

## 2020-03-04 LAB — SARS CORONAVIRUS 2 (TAT 6-24 HRS): SARS Coronavirus 2: NEGATIVE

## 2020-03-04 MED ORDER — SERTRALINE HCL 25 MG PO TABS: 25.0000 mg | ORAL_TABLET | Freq: Every day | ORAL | Status: AC

## 2020-03-04 MED ORDER — SENNOSIDES-DOCUSATE SODIUM 8.6-50 MG PO TABS: 1.0000 | ORAL_TABLET | Freq: Two times a day (BID) | ORAL | Status: AC

## 2020-03-04 MED ORDER — PROPRANOLOL HCL 10 MG PO TABS: 10.0000 mg | ORAL_TABLET | Freq: Three times a day (TID) | ORAL | Status: DC

## 2020-03-04 MED ORDER — INSULIN DETEMIR 100 UNIT/ML ~~LOC~~ SOLN: 10.0000 [IU] | Freq: Two times a day (BID) | SUBCUTANEOUS | 11 refills | Status: AC

## 2020-03-04 MED ORDER — LISINOPRIL 2.5 MG PO TABS: 7.5000 mg | ORAL_TABLET | Freq: Every day | ORAL | Status: DC

## 2020-03-04 MED ORDER — INSULIN ASPART 100 UNIT/ML ~~LOC~~ SOLN
5.0000 [IU] | Freq: Three times a day (TID) | SUBCUTANEOUS | Status: DC
  Administered 2020-03-04 – 2020-03-07 (×11): 5 [IU] via SUBCUTANEOUS

## 2020-03-04 MED ORDER — SIMETHICONE 80 MG PO CHEW: 160.0000 mg | CHEWABLE_TABLET | Freq: Four times a day (QID) | ORAL | 0 refills | Status: AC | PRN

## 2020-03-04 MED ORDER — INSULIN ASPART 100 UNIT/ML ~~LOC~~ SOLN: 5.0000 [IU] | Freq: Three times a day (TID) | SUBCUTANEOUS | 11 refills | Status: AC

## 2020-03-04 MED ORDER — LISINOPRIL 5 MG PO TABS
7.5000 mg | ORAL_TABLET | Freq: Every day | ORAL | Status: DC
  Administered 2020-03-04 – 2020-03-06 (×3): 7.5 mg via ORAL
  Filled 2020-03-04 (×3): qty 1

## 2020-03-04 MED ORDER — MUSCLE RUB 10-15 % EX CREA: 1.0000 "application " | TOPICAL_CREAM | Freq: Two times a day (BID) | CUTANEOUS | 0 refills | Status: AC

## 2020-03-04 MED ORDER — ASPIRIN 325 MG PO TABS: 325.0000 mg | ORAL_TABLET | Freq: Every day | ORAL | Status: AC

## 2020-03-04 NOTE — Progress Notes (Addendum)
Patient ID: Nicholas [name], male   DOB: 10-11-1950, 69 y.o.   MRN: [id number]   Sw called [employer] to follow up on patient auth denial. SW provided with peer to peer phone number. SW will follow up with Dr. Allena Katz to get this information submitted.

## 2020-03-04 NOTE — Plan of Care (Signed)
  Problem: Consults Goal: RH STROKE PATIENT EDUCATION Description: See Patient Education module for education specifics  Outcome: Progressing   Problem: RH BOWEL ELIMINATION Goal: RH STG MANAGE BOWEL W/MEDICATION W/ASSISTANCE Description: STG Manage Bowel with Medication with  Mod Assistance. Outcome: Progressing   Problem: RH BLADDER ELIMINATION Goal: RH STG MANAGE BLADDER WITH ASSISTANCE Description: STG Manage Bladder With Mod Assistance Outcome: Progressing Goal: RH STG MANAGE BLADDER WITH MEDICATION WITH ASSISTANCE Description: STG Manage Bladder With Medication With Mod I Assistance. Outcome: Progressing   Problem: RH SKIN INTEGRITY Goal: RH STG SKIN FREE OF INFECTION/BREAKDOWN Description: Mod I Outcome: Progressing   Problem: RH SAFETY Goal: RH STG ADHERE TO SAFETY PRECAUTIONS W/ASSISTANCE/DEVICE Description: STG Adhere to Safety Precautions With Min Assistance/Device. Outcome: Progressing Goal: RH STG DECREASED RISK OF FALL WITH ASSISTANCE Description: STG Decreased Risk of Fall With Min Assistance. Outcome: Progressing   Problem: RH COGNITION-NURSING Goal: RH STG ANTICIPATES NEEDS/CALLS FOR ASSIST W/ASSIST/CUES Description: STG Anticipates Needs/Calls for Assist With Min  Assistance/Cues. Outcome: Progressing   Problem: RH Vision Goal: RH LTG Vision (Specify) Outcome: Progressing   

## 2020-03-04 NOTE — [place] FL2 (Signed)
[place] MEDICAID FL2 LEVEL OF CARE SCREENING TOOL     IDENTIFICATION  Patient Name: Nicholas [name] Birthdate: 1950-11-24 Sex: male Admission Date (Current Location): 02/02/2020  [facility] and [place] Number:  Producer, television/film/video and Address:  [facility] [place], [address], [place], [place] [address]      Provider Number:    Attending Physician Name and Address:  Marcello Fennel, MD  Relative Name and Phone Number:  Alexander Bergeron    Current Level of Care: Hospital Recommended Level of Care: Skilled Nursing Facility Prior Approval Number:    Date Approved/Denied: 03/04/20 PASRR Number: [id number]  Discharge Plan: SNF    Current Diagnoses: Patient Active Problem List   Diagnosis Date Noted  . Restlessness   . Myalgia   . Benign essential HTN   . Seizure prophylaxis   . Spastic hemiparesis (HCC)   . Labile blood pressure   . Labile blood glucose   . Pressure injury of skin 02/05/2020  . Orthostasis   . Acute blood loss anemia   . Hypertensive crisis   . Hyponatremia   . SDH (subdural hematoma) (HCC)   . Transaminitis   . Wide-complex tachycardia (HCC)   . Intracranial vasospasm   . Essential hypertension   . Controlled type 2 diabetes mellitus with hyperglycemia (HCC)   . Hepatitis   . Abdominal distention   . NSVT (nonsustained ventricular tachycardia) (HCC)   . Rate-related bundle branch block   . Acute hypoxemic respiratory failure (HCC)   . Aneurysm (HCC)   . SAH (subarachnoid hemorrhage) (HCC) 01/14/2020  . Other and unspecified hyperlipidemia 07/17/2013  . Need for prophylactic vaccination and inoculation against influenza 07/17/2013  . Type II or unspecified type diabetes mellitus without mention of complication, not stated as uncontrolled 04/12/2013  . 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 Elevated transaminase measurement 04/12/2013  . Type II or unspecified type diabetes mellitus without mention of complication, uncontrolled  01/05/2013  . Essential hypertension, benign 01/05/2013  . BPH (benign prostatic hyperplasia) 01/05/2013  . Overweight(278.02) 01/05/2013    Orientation RESPIRATION BLADDER Height & Weight     Self, Situation  Normal Incontinent Weight:  (unable to way d/t spec bed) Height:  6' (182.9 cm)  BEHAVIORAL SYMPTOMS/MOOD NEUROLOGICAL BOWEL NUTRITION STATUS      Incontinent Diet  AMBULATORY STATUS COMMUNICATION OF NEEDS Skin   Extensive Assist Verbally Normal                       Personal Care Assistance Level of Assistance  Bathing, Feeding, Dressing Bathing Assistance: Maximum assistance Feeding assistance: Limited assistance Dressing Assistance: Maximum assistance Total Care Assistance: Maximum assistance   Functional Limitations Info             SPECIAL CARE FACTORS FREQUENCY  PT (By licensed PT), OT (By licensed OT), Speech therapy     PT Frequency: 5x a week OT Frequency: 5x a week     Speech Therapy Frequency: 5x a week      Contractures Contractures Info: Not present    Additional Factors Info                  Current Medications (03/04/2020):  This is the current hospital active medication list Current Facility-Administered Medications  Medication Dose Route Frequency Provider Last Rate Last Admin  . acetaminophen (TYLENOL) tablet 650 mg  650 mg Oral Q4H PRN Charlton Amor, PA-C   650 mg at 03/03/20 1920  Or  . acetaminophen (TYLENOL) 160 MG/5ML solution 650 mg  650 mg Per Tube Q4H PRN Cathlyn Parsons, PA-C   650 mg at 02/27/20 1527   Or  . acetaminophen (TYLENOL) suppository 650 mg  650 mg Rectal Q4H PRN Angiulli, Lavon Paganini, PA-C      . aspirin tablet 325 mg  325 mg Oral Daily Cathlyn Parsons, PA-C   325 mg at 03/04/20 0745  . bisacodyl (DULCOLAX) suppository 10 mg  10 mg Rectal Daily PRN Angiulli, Lavon Paganini, PA-C      . cholecalciferol (VITAMIN D3) tablet 1,000 Units  1,000 Units Oral Daily Cathlyn Parsons, PA-C   1,000 Units at  03/04/20 0745  . enoxaparin (LOVENOX) injection 40 mg  40 mg Subcutaneous Q24H AngiulliLavon Paganini, PA-C   40 mg at 03/03/20 1721  . fenofibrate tablet 160 mg  160 mg Oral Daily Cathlyn Parsons, PA-C   160 mg at 03/04/20 0744  . hydrALAZINE (APRESOLINE) tablet 25 mg  25 mg Oral Q8H PRN Izora Ribas, MD   25 mg at 02/04/20 0610  . HYDROcodone-acetaminophen (NORCO) 7.5-325 MG per tablet 1 tablet  1 tablet Oral Q4H PRN Lovorn, Jinny Blossom, MD   1 tablet at 02/25/20 1027  . insulin aspart (novoLOG) injection 0-6 Units  0-6 Units Subcutaneous TID WC AngiulliLavon Paganini, PA-C   2 Units at 03/02/20 1735  . insulin aspart (novoLOG) injection 5 Units  5 Units Subcutaneous TID WC Patel, Domenick Bookbinder, MD      . insulin detemir (LEVEMIR) injection 10 Units  10 Units Subcutaneous BID Jamse Arn, MD   10 Units at 03/04/20 0745  . lisinopril (ZESTRIL) tablet 7.5 mg  7.5 mg Oral QHS Jamse Arn, MD      . methocarbamol (ROBAXIN) tablet 500 mg  500 mg Oral Q8H PRN Jamse Arn, MD   500 mg at 03/03/20 1920  . Muscle Rub CREA   Topical BID Charlett Blake, MD   Given at 03/04/20 [id number]  . propranolol (INDERAL) tablet 10 mg  10 mg Oral TID Meredith Staggers, MD   10 mg at 03/04/20 0744  . rosuvastatin (CRESTOR) tablet 40 mg  40 mg Oral Daily Cathlyn Parsons, PA-C   40 mg at 03/04/20 0744  . senna-docusate (Senokot-S) tablet 1 tablet  1 tablet Oral BID Cathlyn Parsons, PA-C   1 tablet at 03/04/20 0744  . sertraline (ZOLOFT) tablet 25 mg  25 mg Oral Daily Raulkar, Clide Deutscher, MD   25 mg at 03/04/20 0744  . simethicone (MYLICON) chewable tablet 160 mg  160 mg Oral QID PRN Angiulli, Lavon Paganini, PA-C      . sorbitol 70 % solution 30 mL  30 mL Oral Daily PRN Cathlyn Parsons, PA-C   30 mL at 02/25/20 2947     Discharge Medications: Please see discharge summary for a list of discharge medications.  Relevant Imaging Results:  Relevant Lab Results:   Additional Information    Dyanne Iha

## 2020-03-04 NOTE — Progress Notes (Addendum)
[facility] PHYSICAL MEDICINE & REHABILITATION PROGRESS NOTE   Subjective/Complaints: Patient seen laying in bed this morning.  He states he slept well overnight.  Hypoglycemia overnight.  ROS: Limited due to cognition   Objective:   No results found. No results for input(s): WBC, HGB, HCT, PLT in the last 72 hours. No results for input(s): NA, K, CL, CO2, GLUCOSE, BUN, CREATININE, CALCIUM in the last 72 hours.  Intake/Output Summary (Last 24 hours) at 03/04/2020 0827 Last data filed at 03/03/2020 1832 Gross per 24 hour  Intake 300 ml  Output --  Net 300 ml     Physical Exam: Vital Signs Blood pressure (!) 167/104, pulse 82, temperature 97.8 F (36.6 C), temperature source Oral, resp. rate 18, height 6' (1.829 m), weight 89.8 kg, SpO2 98 %. Constitutional: No distress . Vital signs reviewed. HENT: Normocephalic.  Atraumatic. Eyes: EOMI. No discharge. Cardiovascular: No JVD. Respiratory: Normal effort.  No stridor. GI: Non-distended. Skin: Warm and dry.  Intact. Psych: Confused.  Distracted. Musc: No edema in extremities.  No tenderness in extremities. Neuro: Alert Motor:  Motor: LUE: 4 --4/5 proximal distal with apraxia, unchanged LLE: 3/5 proximal distal, stable  Assessment/Plan: 1. Functional deficits secondary to [facility] which require 3+ hours per day of interdisciplinary therapy in a comprehensive inpatient rehab setting.  Physiatrist is providing close team supervision and 24 hour management of active medical problems listed below.  Physiatrist and rehab team continue to assess barriers to discharge/monitor patient progress toward functional and medical goals  Care Tool:  Bathing    Body parts bathed by patient: Right arm, Left arm, Chest, Abdomen, Front perineal area, Right upper leg, Left upper leg, Face   Body parts bathed by helper: Buttocks, Right lower leg, Left lower leg Body parts n/a: Chest, Abdomen, Front perineal area, Buttocks   Bathing assist Assist  Level: Minimal Assistance - Patient > 75%     Upper Body Dressing/Undressing Upper body dressing   What is the patient wearing?: Pull over shirt    Upper body assist Assist Level: Moderate Assistance - Patient 50 - 74%    Lower Body Dressing/Undressing Lower body dressing      What is the patient wearing?: Pants (max A and +2 for sit to stands)     Lower body assist Assist for lower body dressing: 2 Helpers (sitting and standing)     Toileting Toileting Toileting Activity did not occur (Clothing management and hygiene only): N/A (no void or bm)  Toileting assist Assist for toileting: Total Assistance - Patient < 25%     Transfers Chair/bed transfer  Transfers assist     Chair/bed transfer assist level: 2 Helpers     Locomotion Ambulation   Ambulation assist   Ambulation activity did not occur: Safety/medical concerns (fall risk/processing)  Assist level: 2 helpers Assistive device: Parallel bars Max distance: 4 ft   Walk 10 feet activity   Assist  Walk 10 feet activity did not occur: Safety/medical concerns (fall risk/processing)        Walk 50 feet activity   Assist Walk 50 feet with 2 turns activity did not occur: Safety/medical concerns (fall risk/processing)         Walk 150 feet activity   Assist Walk 150 feet activity did not occur: Safety/medical concerns (fall risk/processing)         Walk 10 feet on uneven surface  activity   Assist Walk 10 feet on uneven surfaces activity did not occur: Safety/medical concerns (fall risk/processing)  Wheelchair     Assist Will patient use wheelchair at discharge?: Yes Type of Wheelchair: Manual    Wheelchair assist level: Dependent - Patient 0% Max wheelchair distance: 150 ft    Wheelchair 50 feet with 2 turns activity    Assist        Assist Level: Dependent - Patient 0%   Wheelchair 150 feet activity     Assist      Assist Level: Dependent - Patient  0%    Medical Problem List and Plan: 1.  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  Status post cerebral angiogram balloon angioplasty  Plan was for discharge today, however wife now states she cannot care for patient and is requesting SNF.  Later notified of plans for discharge today to SNF. Later notified on cancelled d/c due to insurance denial.    Bilateral PRAFO  Patient will require specialty wheelchair for mobility given cognitive and physical deficits.  Will see patient for hospital follow-up in 1 month post discharge 2.  Antithrombotics: -DVT/anticoagulation: Lovenox initiated 01/29/2020             -antiplatelet therapy: Aspirin 325 mg daily 3. Pain Management: Oxycodone as needed  Robaxin prn with scheduled qhs  Changed Oxycodone to Norco 7.5 mg/325 mg q4 hours per son request since been so sedated.  Muscle rub ordered on 5/27, scheduled may be used for neck low back and buttock area  Controlled on 6/15 4. Mood: Provide emotional support             -antipsychotic agents: N/A  Zoloft daily.   Amantadine 100 with breakfast and lunch, decreased to 50 on 6/10 and monitor for improvement in restlessness/distraction, DC'd on 6/12.  Propranolol started on 6/13 5. Neuropsych: This patient is not capable of making decisions on his own behalf. 6. Skin/Wound Care: Routine skin checks 7. Fluids/Electrolytes/Nutrition: Routine in and outs   BMP within acceptable range on 6/7   Carb modified diet 8.  Diabetes mellitus with hyperglycemia.  Hemoglobin A1c 7.7.  Check blood sugars before meals and at bedtime.  Patient on Tresiba 35 units daily, Glucotrol 10 mg daily, Actos 45 mg daily, Janumet 50-1000 mg twice daily, NovoLog 75 units daily prior to admission.               Levemir 31U BID, decreased to 28 twice daily on 5/26, decreased to 25 BID on 5/28, decreased to 22 on 6/3, decreased to 18 BID on 6/5, decreased to 10 BID on 6/9  NovoLog 3 units 3 times daily  started on 5/26, increased to 5 TID on 5/28, increased to 8 3 times daily on 6/6, decreased to 5 3 times daily on 6/15   CBG (last 3)  Recent Labs    03/03/20 1630 03/03/20 2105 03/04/20 0614  GLUCAP 98 58* 107*   Labile with hypoglycemia 9.  Seizure prophylaxis. Keppra 500 mg twice daily, decreased to 250 twice daily on 5/25, d/ced on 5/28 10.  Arrhythmia/wide-complex tachycardia.  Amiodarone 200 mg daily x4 weeks, completed on 6/12.  Follow-up cardiology services 11.  Hypertension with orthostasis.    Nimotop decreased on 6/9, DC'd on 6/11.  TEDS and abdominal binder as needed  Lisinopril 2.5 nightly started on 6/14, increased to 7.5 on 6/15   Vitals:   03/03/20 1920 03/04/20 0445  BP: (!) 194/105 (!) 167/104  Pulse: 91 82  Resp: 18 18  Temp: 97.7 F (36.5 C) 97.8 F (36.6 C)  SpO2: 100% 98%   Extremely labile with hypertensive crisis on 6/15, had been otherwise relatively controlled with 1 exception 12.  Hyperlipidemia.  Crestor/fenofibrate 13. Mild vasospasm evident on 5/14 vascular transcranial doppler. 14. Spasticity:  Started baclofen 5mg  TID for pain relief and spasticity, d/ced on 6/2 15.  Hyponatremia: Resolved  Sodium 139 on 5/28  Continue to monitor 16. Transaminitis: Resolved  LFTs WNL on 5/28  Continue to monitor 17.  Acute blood loss anemia  Hemoglobin 11.8 on 6/7  Continue to monitor  > 30 minutes spent in total in discharge planning between myself and PA regarding aforementioned, as well discussion regarding DME equipment, follow-up appointments, follow-up therapies, discharge medications, discharge recommendations  LOS: 31 days A FACE TO FACE EVALUATION WAS PERFORMED  Shariq Puig Lorie Phenix 03/04/2020, 8:27 AM

## 2020-03-04 NOTE — Progress Notes (Addendum)
Occupational Therapy Discharge Summary (Addendum)  Patient Details  Name: Nicholas [name] MRN: [id number] Date of Birth: [birth year]  Today's Date: 03/04/2020    PATIENT DID NOT NOT DISCHARGE TO SNF AS PLANNED.  WILL CONTINUE TO SEE PATIENT QD.  LTG'S ADJUSTED.    Patient has met 1 of 15 long term goals due to functional use of  LEFT upper extremity.  Patient to discharge at overall max assist + 2 at w/c level.  Pt will be transitioning to skilled nursing facility for continued skilled therapy and will receive assist from nursing to provide the necessary physical and cognitive assistance at discharge.      Reasons goals not met:  Progress towards goals significantly impacted by cognitive deficits including impaired sequencing, attention including left neglect and impaired divided attention, problem solving, safety awareness, impulse control, orientation, and balance deficits including impaired sitting and standing static and dynamic with intermittent severe left, posterior, and anterior lean with difficulty righting to midline, and motor planning deficits with left ataxic movements.  Pt also exhibits incontinence of bowel and bladder which acts as a barrier to increased independence with toileting.  Recommendation:  Patient will benefit from ongoing skilled OT services in skilled nursing facility setting to continue to advance skills in the area of left sided attention, sequencing, safety awareness, LUE motor planning, problem solving, balance and righting reactions, in order to increase BADL independence.  Equipment: No equipment provided  Reasons for discharge: lack of progress toward goals and discharge from hospital  Patient/family agrees with progress made and goals achieved: Yes  OT Discharge Precautions/Restrictions  Precautions Precaution Comments: Lt hemiparesis with Lt neglect ; watch BP    ADL ADL Eating: Moderate assistance Where Assessed-Eating: Wheelchair Grooming: Minimal  assistance Where Assessed-Grooming: Sitting at sink Upper Body Bathing: Minimal cueing, Minimal assistance Where Assessed-Upper Body Bathing: Bed level Lower Body Bathing: Moderate assistance Where Assessed-Lower Body Bathing: Bed level Upper Body Dressing: Moderate assistance Where Assessed-Upper Body Dressing: Wheelchair Lower Body Dressing: Maximal assistance Where Assessed-Lower Body Dressing: Bed level Toileting: Dependent Where Assessed-Toileting: Other (Comment) (Pt incontinent of bowel and bladder) Toilet Transfer: Not assessed Tub/Shower Transfer: Maximal assistance (+ 2) Social research officer, government: Maximal assistance Social research officer, government Method: Other (comment) (roll in shower chair) Youth worker: Other (comment) Vision Baseline Vision/History: Wears glasses Wears Glasses: At all times Patient Visual Report: Other (comment) Vision Assessment?: Yes Ocular Range of Motion: Within Functional Limits Alignment/Gaze Preference: Gaze right Tracking/Visual Pursuits: Decreased smoothness of horizontal tracking;Decreased smoothness of vertical tracking;Other (comment) Saccades: Additional eye shifts occurred during testing;Decreased speed of saccadic movement Perception  Perception: Impaired Inattention/Neglect: Does not attend to left side of body;Does not attend to left visual field Praxis Praxis: Impaired Praxis Impairment Details: Motor planning;Initiation Cognition Overall Cognitive Status: Impaired/Different from baseline Arousal/Alertness: Awake/alert Attention: Focused;Divided;Alternating;Sustained Focused Attention: Appears intact Sustained Attention: Impaired Sustained Attention Impairment: Verbal basic;Functional basic Selective Attention: Impaired Selective Attention Impairment: Verbal basic;Functional basic Alternating Attention: Impaired Alternating Attention Impairment: Verbal basic;Functional basic Divided Attention: Impaired Divided Attention  Impairment: Verbal basic;Functional basic Memory: Impaired Memory Impairment: Storage deficit;Retrieval deficit;Decreased recall of new information Awareness: Impaired Awareness Impairment: Intellectual impairment;Emergent impairment;Anticipatory impairment Problem Solving: Impaired Problem Solving Impairment: Verbal basic;Functional basic Executive Function: Reasoning;Sequencing;Decision Making;Initiating;Self Correcting Reasoning: Impaired Sequencing: Impaired Sequencing Impairment: Verbal basic;Functional basic Decision Making: Impaired Decision Making Impairment: Verbal basic;Functional basic Initiating: Impaired Initiating Impairment: Verbal basic;Functional basic Self Monitoring: Impaired Self Monitoring Impairment: Verbal basic;Functional basic Self Correcting: Impaired Self Correcting Impairment: Verbal basic;Functional basic Behaviors:  Impulsive;Restless Safety/Judgment: Impaired Comments: slow processing, restless, no safety awareness Sensation Sensation Light Touch: Impaired by gross assessment Peripheral sensation comments: unable to accurately assess due to cognition Light Touch Impaired Details: Absent LLE;Impaired LUE Hot/Cold: Impaired by gross assessment Proprioception: Impaired by gross assessment Proprioception Impaired Details: Impaired LUE;Absent LLE Stereognosis: Impaired by gross assessment Coordination Gross Motor Movements are Fluid and Coordinated: No Fine Motor Movements are Fluid and Coordinated: No Coordination and Movement Description: coordination impaired secondary to hemiparesis, has also been having increasing ataxia LLE/LUE Finger Nose Finger Test: impaired Motor  Motor Motor: Hemiplegia;Abnormal tone;Abnormal postural alignment and control;Motor apraxia Motor - Skilled Clinical Observations: L hemiparesis LE>UE Motor - Discharge Observations: L hemipareiss LE>UE, ataxia, motor apraxia Mobility  Bed Mobility Bed Mobility: Rolling  Right;Rolling Left;Supine to Sit;Sit to Supine Rolling Right: Moderate Assistance - Patient 50-74% Rolling Left: Moderate Assistance - Patient 50-74% Supine to Sit: 2 Helpers Sit to Supine: 2 Helpers Transfers Sit to Stand: 2 Helpers Stand to Sit: 2 Helpers  Trunk/Postural Assessment  Cervical Assessment Cervical Assessment: Exceptions to [facility] Cervical AROM Overall Cervical AROM Comments: kyphotic posture Thoracic Assessment Thoracic Assessment: Exceptions to [facility] Thoracic AROM Overall Thoracic AROM: Other (comment) Overall Thoracic AROM Comments: forward flexed; laterally flexed to left Lumbar Assessment Lumbar Assessment: Exceptions to [facility] Lumbar AROM Overall Lumbar AROM: Other (comment) Overall Lumbar AROM Comments: posterior pelvic tilt  Balance Balance Balance Assessed: Yes Static Sitting Balance Static Sitting - Balance Support: No upper extremity supported Static Sitting - Level of Assistance: 3: Mod assist Dynamic Sitting Balance Dynamic Sitting - Balance Support: Bilateral upper extremity supported Dynamic Sitting - Level of Assistance: 2: Max assist Sitting balance - Comments: fluctuates depending on the day- can need MaxA due to forward and left lean/push Static Standing Balance Static Standing - Balance Support: Bilateral upper extremity supported Static Standing - Level of Assistance: 1: +2 Total assist Dynamic Standing Balance Dynamic Standing - Balance Support: Bilateral upper extremity supported Dynamic Standing - Level of Assistance: 1: +2 Total assist Extremity/Trunk Assessment RUE Assessment RUE Assessment: Within Functional Limits LUE Assessment LUE Assessment: Exceptions to [facility] General Strength Comments: apraxia, difficult to assess MMT due to cognition, hemiparetic LUE Body System: Neuro Brunstrum level for arm: Stage V Relative Independence from [employer]   Sukaina Toothaker L Markitta Ausburn 03/04/2020, 12:57 PM

## 2020-03-04 NOTE — Progress Notes (Signed)
Patient ID: Nicholas [name], male   DOB: Mar 06, 1951, 69 y.o.   MRN: [id number]   Patient set up with [facility] before making decision to transition to SNF. SW included contact information for [facility] with d/c orders.

## 2020-03-04 NOTE — Progress Notes (Addendum)
Patient ID: Nicholas [name], male   DOB: 10/14/1950, 69 y.o.   MRN: [id number]   SW received message from Patton Salles, RN (Nurse Liaison for [facility]) facility has bed for patient today. Would like patient to transfer today. SW follow up with PA and Nurse, rapid COVID order placed for patient. SW followed up with spouse to inform her of the transfer today, she is ok with patient transporting today. SW set patient transport for 4PM today, spouse notified. SW will leave discharge package at nursing station.

## 2020-03-04 NOTE — Progress Notes (Signed)
Patient ID: Nicholas [name], male   DOB: 02-May-1951, 69 y.o.   MRN: [id number]   Patient discharge package left a nursing station. Package includes: Facesheet, AVS, Medical necessity transport form and current FL2.  NURSING PLEASE ADD COVID RESULTS WHEN RETURNED,  Transport set for 4 PM!  Thank you!

## 2020-03-04 NOTE — Progress Notes (Signed)
BS=58; no s/s of distress. Patient given orange juice. BS rechecked= 119.

## 2020-03-05 LAB — GLUCOSE, CAPILLARY
Glucose-Capillary: 129 mg/dL — ABNORMAL HIGH (ref 70–99)
Glucose-Capillary: 186 mg/dL — ABNORMAL HIGH (ref 70–99)
Glucose-Capillary: 168 mg/dL — ABNORMAL HIGH (ref 70–99)
Glucose-Capillary: 227 mg/dL — ABNORMAL HIGH (ref 70–99)

## 2020-03-05 MED ORDER — AMANTADINE HCL 50 MG/5ML PO SYRP
100.0000 mg | ORAL_SOLUTION | Freq: Two times a day (BID) | ORAL | Status: DC
  Administered 2020-03-05 – 2020-03-07 (×6): 100 mg via ORAL
  Filled 2020-03-05 (×7): qty 10

## 2020-03-05 NOTE — Progress Notes (Signed)
Patient ID: Nicholas [name], male   DOB: Feb 03, 1951, 69 y.o.   MRN:   SW has informed spouse of potential of patient being discharged home. Pending peer to peer phone call. In preporation sw has patient set up for services inside home and provided spouse with [facility] resources. SW will place DME order again in case patient will need to discharge. Will follow up once updated.

## 2020-03-05 NOTE — Plan of Care (Signed)
  Problem: Consults Goal: RH STROKE PATIENT EDUCATION Description: See Patient Education module for education specifics  Outcome: Progressing   Problem: RH BOWEL ELIMINATION Goal: RH STG MANAGE BOWEL W/MEDICATION W/ASSISTANCE Description: STG Manage Bowel with Medication with  Mod Assistance. Outcome: Progressing   Problem: RH BLADDER ELIMINATION Goal: RH STG MANAGE BLADDER WITH ASSISTANCE Description: STG Manage Bladder With Mod Assistance Outcome: Progressing Goal: RH STG MANAGE BLADDER WITH MEDICATION WITH ASSISTANCE Description: STG Manage Bladder With Medication With Mod I Assistance. Outcome: Progressing   Problem: RH SKIN INTEGRITY Goal: RH STG SKIN FREE OF INFECTION/BREAKDOWN Description: Mod I Outcome: Progressing   Problem: RH SAFETY Goal: RH STG ADHERE TO SAFETY PRECAUTIONS W/ASSISTANCE/DEVICE Description: STG Adhere to Safety Precautions With Min Assistance/Device. Outcome: Progressing Goal: RH STG DECREASED RISK OF FALL WITH ASSISTANCE Description: STG Decreased Risk of Fall With [employer]. Outcome: Progressing   Problem: RH COGNITION-NURSING Goal: RH STG ANTICIPATES NEEDS/CALLS FOR ASSIST W/ASSIST/CUES Description: STG Anticipates Needs/Calls for Assist With Min  Assistance/Cues. Outcome: Progressing   Problem: RH Vision Goal: RH LTG Vision (Specify) Outcome: Progressing

## 2020-03-05 NOTE — Progress Notes (Addendum)
Patient ID: Nicholas [name], male   DOB: 05/01/1951, 69 y.o.   MRN: [id number]  Team Conference Report to Patient/Family  Team Conference discussion was reviewed with the patient and caregiver, including goals, any changes in plan of care and target discharge date.  Patient and caregiver express understanding and are in agreement.  The patient has a target discharge date of  (SNF pending/Home with wife).  Andria Rhein 03/05/2020, 2:07 PM

## 2020-03-05 NOTE — Patient Care Conference (Signed)
Inpatient RehabilitationTeam Conference and Plan of Care Update Date: 03/05/2020   Time: 1:55 PM    Patient Name: Nicholas [name]      Medical Record Number: [id number]  Date of Birth: 1951-05-23 Sex: Male         Room/Bed: 4W01C/4W01C-01 Payor Info: Payor: BLUE [employer] BLUE [employer]IELD / Plan: [employer] COMM PPO / Product Type: *No Product type* /    Admit Date/Time:  02/02/2020  3:33 PM  Primary Diagnosis:  SDH (subdural hematoma) (HCC)  Patient Active Problem List   Diagnosis Date Noted  . Restlessness   . Myalgia   . Benign essential HTN   . Spastic hemiparesis (HCC)   . Labile blood pressure   . Labile blood glucose   . Pressure injury of skin 02/05/2020  . Orthostasis   . Acute blood loss anemia   . SDH (subdural hematoma) (HCC)   . Transaminitis   . Intracranial vasospasm   . Essential hypertension   . Controlled type 2 diabetes mellitus with hyperglycemia (HCC)   . Hepatitis   . Abdominal distention   . NSVT (nonsustained ventricular tachycardia) (HCC)   . Rate-related bundle branch block   . Acute hypoxemic respiratory failure (HCC)   . Aneurysm (HCC)   . SAH (subarachnoid hemorrhage) (HCC) 01/14/2020  . Other and unspecified hyperlipidemia 07/17/2013  . Need for prophylactic vaccination and inoculation against influenza 07/17/2013  . Type II or unspecified type diabetes mellitus without mention of complication, not stated as uncontrolled 04/12/2013  . 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 Elevated transaminase measurement 04/12/2013  . Type II or unspecified type diabetes mellitus without mention of complication, uncontrolled 01/05/2013  . Essential hypertension, benign 01/05/2013  . BPH (benign prostatic hyperplasia) 01/05/2013  . Overweight(278.02) 01/05/2013    Expected Discharge Date: Expected Discharge Date:  (SNF pending/Home with wife)  Team Members Present: Physician leading conference: Dr. Maryla Morrow Care Coodinator Present: Cheyenne Adas, RN, BSN, CRRN;Christina  Vita Barley, BSW Nurse Present: Other (comment) Mauro Kaufmann, LPN) PT Present: Nedra Hai, PT OT Present: Primitivo Gauze, OT SLP Present: Suzzette Righter, CF-SLP PPS Coordinator present : Edson Snowball, PT     Current Status/Progress Goal Weekly Team Focus  Bowel/Bladder   Incontinent of bowel and bladder  Timed toileting while awake and few periods of incontinence  assess toileting needs qshift and PRN   Swallow/Nutrition/ Hydration             ADL's   max to total A with self care  downgraded to mod -max A  postural training, balance, L side awareness, functional mobility training   Mobility             Communication             Safety/Cognition/ Behavioral Observations  Max A, very little change over last week  Most goals downgraded to Max A  sustained attention, orientation, recall with aids, functional problem solving   Pain   Chronic lower back noted pain under control with PRN robaxin and tylenol.  pain less than 3  assess pain qshift and PRN   Skin   skin intact  Maintain skin healing and integrity with peri care, barrier creams and foam dressing. Encourage balanced nutrition.  assess skin qshift and PRN    Rehab Goals Patient on target to meet rehab goals: Yes Rehab Goals Revised: Patient goals decresed *See Care Plan and progress notes for long and short-term goals.     Barriers to Discharge  Current Status/Progress Possible Resolutions  Date Resolved   Nursing                  PT                    OT                  SLP                Care Coordinator     on target          Discharge Planning/Teaching Needs:  Patient discharging to SNF  Family educated   Team Discussion:  Little progress/change this week. Note max-total assist for care. Insurance denied SNF; appeal pending peer to peer. Continue to note fidgeting and difficulty keeping on tasks; requires max assist for recall and processing.  Revisions to Treatment Plan:  Q-day therapy sessions     Medical Summary Current Status: Left-sided weakness secondary to left greater than right SDH and  SAH.  Status post cerebral angiogram balloon angioplasty Weekly Focus/Goal: Improve mobility, optimize CBGs/BP, cognition  Barriers to Discharge: Behavior;Medical stability;Decreased family/caregiver support  Barriers to Discharge Comments: Insurance denied SNF Possible Resolutions to Barriers: Therapies, optimize BP/DM meds   Continued Need for Acute Rehabilitation Level of Care: The patient requires daily medical management by a physician with specialized training in physical medicine and rehabilitation for the following reasons: Direction of a multidisciplinary physical rehabilitation program to maximize functional independence : Yes Medical management of patient stability for increased activity during participation in an intensive rehabilitation regime.: Yes Analysis of laboratory values and/or radiology reports with any subsequent need for medication adjustment and/or medical intervention. : Yes   I attest that I was present, lead the team conference, and concur with the assessment and plan of the team.   Dorien Chihuahua B 03/05/2020, 1:55 PM

## 2020-03-05 NOTE — Plan of Care (Signed)
GOAL ADDENDUM DUE TO PATIENT STAYING ON UNIT 03/05/20   Problem: RH Balance Goal: LTG Patient will maintain dynamic standing balance (PT) Description: LTG:  Patient will maintain dynamic standing balance with assistance during mobility activities (PT) Flowsheets (Taken 03/05/2020 1603) LTG: Pt will maintain dynamic standing balance during mobility activities with:: (reinstated goal due to patient staying on unit) Moderate Assistance - Patient 50 - 74% Note: Reinstated goal due to patient staying on unit    Problem: Sit to Stand Goal: LTG:  Patient will perform sit to stand with assistance level (PT) Description: LTG:  Patient will perform sit to stand with assistance level (PT) Flowsheets (Taken 02/03/2020 1414 by Colvin Caroli, Irving Burton, PT) LTG: PT will perform sit to stand in preparation for functional mobility with assistance level: Moderate Assistance - Patient 50 - 74%   Problem: RH Bed Mobility Goal: LTG Patient will perform bed mobility with assist (PT) Description: LTG: Patient will perform bed mobility with assistance, with/without cues (PT). Flowsheets (Taken 03/05/2020 1603) LTG: Pt will perform bed mobility with assistance level of: (reinstated goal due to patient staying on unit) Maximal Assistance - Patient 25 - 49% Note: Reinstated goal due to patient staying on unit    Problem: RH Bed to Chair Transfers Goal: LTG Patient will perform bed/chair transfers w/assist (PT) Description: LTG: Patient will perform bed to chair transfers with assistance (PT). Flowsheets (Taken 03/05/2020 1605) LTG: Pt will perform Bed to Chair Transfers with assistance level: Moderate Assistance - Patient 50 - 74%  Madelaine Etienne, DPT, PN1   Supplemental Physical Therapist [facility]    Pager [phone] Acute Rehab Office [phone]

## 2020-03-05 NOTE — Plan of Care (Signed)
°  Problem: RH Memory Goal: LTG Patient will use memory compensatory aids to (SLP) Description: LTG:  Patient will use memory compensatory aids to recall biographical/new, daily complex information with cues (SLP) Flowsheets (Taken 03/05/2020 1212) LTG: Patient will use memory compensatory aids to (SLP): Moderate Assistance - Patient 50 - 74%   Problem: RH Attention Goal: LTG Patient will demonstrate this level of attention during functional activites (SLP) Description: LTG:  Patient will will demonstrate this level of attention during functional activites (SLP) Flowsheets (Taken 03/05/2020 1212) Patient will demonstrate during cognitive/linguistic activities the attention type of: Sustained Patient will demonstrate this level of attention during cognitive/linguistic activities in: Controlled LTG: Patient will demonstrate this level of attention during cognitive/linguistic activities with assistance of (SLP): Moderate Assistance - Patient 50 - 74% Number of minutes patient will demonstrate attention during cognitive/linguistic activities: 5   Problem: RH Cognition - SLP Goal: RH LTG Patient will demonstrate orientation with cues Description:  LTG:  Patient will demonstrate orientation to person/place/time/situation with cues (SLP)   Flowsheets (Taken 03/05/2020 1212) LTG Patient will demonstrate orientation to:  Place  Time  Situation  Person LTG: Patient will demonstrate orientation using cueing (SLP): Moderate Assistance - Patient 50 - 74%  New LTGs initiated 03/05/20 to target while pt is awaiting final d/c placement/plan.

## 2020-03-05 NOTE — Progress Notes (Addendum)
Patient ID: Nicholas [name], male   DOB: 03/29/1951, 69 y.o.   MRN: [id number]   Patient DME re ordered. [employer] scheduling delivery with spouse

## 2020-03-05 NOTE — Progress Notes (Signed)
[place] PHYSICAL MEDICINE & REHABILITATION PROGRESS NOTE   Subjective/Complaints: Patient seen laying in bed this morning.  He indicates he slept well overnight.  He is slowed.  Yesterday, back and forth regarding discharge to SNF.  Ultimately denied by insurance.  ROS: Limited due to cognition   Objective:   No results found. No results for input(s): WBC, HGB, HCT, PLT in the last 72 hours. No results for input(s): NA, K, CL, CO2, GLUCOSE, BUN, CREATININE, CALCIUM in the last 72 hours.  Intake/Output Summary (Last 24 hours) at 03/05/2020 0922 Last data filed at 03/04/2020 1830 Gross per 24 hour  Intake 440 ml  Output --  Net 440 ml     Physical Exam: Vital Signs Blood pressure (!) 166/87, pulse 83, temperature 98 F (36.7 C), temperature source Oral, resp. rate 16, height 6' (1.829 m), weight 89.8 kg, SpO2 99 %. Constitutional: No distress . Vital signs reviewed. HENT: Normocephalic.  Atraumatic. Eyes: EOMI. No discharge. Cardiovascular: No JVD. Respiratory: Normal effort.  No stridor. GI: Non-distended. Skin: Warm and dry.  Intact. Psych: Confused.  Slowed. Musc: No edema in extremities.  No tenderness in extremities. Neuro: Alert Motor:  Motor: LUE: 4 --4/5 proximal distal with apraxia, unchanged LLE: 3/5 proximal distal, unchanged  Assessment/Plan: 1. Functional deficits secondary to [facility] which require 3+ hours per day of interdisciplinary therapy in a comprehensive inpatient rehab setting.  Physiatrist is providing close team supervision and 24 hour management of active medical problems listed below.  Physiatrist and rehab team continue to assess barriers to discharge/monitor patient progress toward functional and medical goals  Care Tool:  Bathing    Body parts bathed by patient: Right arm, Left arm, Chest, Abdomen, Front perineal area, Right upper leg, Left upper leg, Face   Body parts bathed by helper: Buttocks, Right lower leg, Left lower leg Body parts  n/a: Chest, Abdomen, Front perineal area, Buttocks   Bathing assist Assist Level: Moderate Assistance - Patient 50 - 74%     Upper Body Dressing/Undressing Upper body dressing   What is the patient wearing?: Pull over shirt    Upper body assist Assist Level: Moderate Assistance - Patient 50 - 74%    Lower Body Dressing/Undressing Lower body dressing      What is the patient wearing?: Pants, Incontinence brief     Lower body assist Assist for lower body dressing: Maximal Assistance - Patient 25 - 49% (bed level)     Toileting Toileting Toileting Activity did not occur (Clothing management and hygiene only): N/A (no void or bm)  Toileting assist Assist for toileting: Total Assistance - Patient < 25%     Transfers Chair/bed transfer  Transfers assist     Chair/bed transfer assist level: 2 Helpers     Locomotion Ambulation   Ambulation assist   Ambulation activity did not occur: Safety/medical concerns (fall risk/processing)  Assist level: 2 helpers Assistive device: Parallel bars Max distance: 4 ft   Walk 10 feet activity   Assist  Walk 10 feet activity did not occur: Safety/medical concerns (fall risk/processing)        Walk 50 feet activity   Assist Walk 50 feet with 2 turns activity did not occur: Safety/medical concerns (fall risk/processing)         Walk 150 feet activity   Assist Walk 150 feet activity did not occur: Safety/medical concerns (fall risk/processing)         Walk 10 feet on uneven surface  activity   Assist Walk  10 feet on uneven surfaces activity did not occur: Safety/medical concerns (fall risk/processing)         Wheelchair     Assist Will patient use wheelchair at discharge?: Yes Type of Wheelchair: Manual    Wheelchair assist level: Dependent - Patient 0% Max wheelchair distance: 150 ft    Wheelchair 50 feet with 2 turns activity    Assist        Assist Level: Dependent - Patient 0%    Wheelchair 150 feet activity     Assist      Assist Level: Dependent - Patient 0%    Medical Problem List and Plan: 1.  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  Status post cerebral angiogram balloon angioplasty  Cont CIR  Bilateral PRAFO  Patient will require specialty wheelchair for mobility given cognitive and physical deficits.  Team conference today to discuss current and goals and coordination of care, home and environmental barriers, and discharge planning with nursing, case manager, and therapies.   Will discussed with medical director at insurance company regarding insurance denial. 2.  Antithrombotics: -DVT/anticoagulation: Lovenox initiated 01/29/2020             -antiplatelet therapy: Aspirin 325 mg daily 3. Pain Management: Oxycodone as needed  Robaxin prn with scheduled qhs  Changed Oxycodone to Norco 7.5 mg/325 mg q4 hours per son request since been so sedated.  Muscle rub ordered on 5/27, scheduled may be used for neck low back and buttock area  Controlled on 6/16 4. Mood: Provide emotional support             -antipsychotic agents: N/A  Zoloft daily.   Amantadine 100 with breakfast and lunch, decreased to 50 on 6/10 and monitor for improvement in restlessness/distraction, DC'd on 6/12.,  Will consider restarting.  Propranolol started on 6/13 5. Neuropsych: This patient is not capable of making decisions on his own behalf. 6. Skin/Wound Care: Routine skin checks 7. Fluids/Electrolytes/Nutrition: Routine in and outs   BMP within acceptable range on 6/7   Carb modified diet 8.  Diabetes mellitus with hyperglycemia.  Hemoglobin A1c 7.7.  Check blood sugars before meals and at bedtime.  Patient on Tresiba 35 units daily, Glucotrol 10 mg daily, Actos 45 mg daily, Janumet 50-1000 mg twice daily, NovoLog 75 units daily prior to admission.               Levemir 31U BID, decreased to 28 twice daily on 5/26, decreased to 25 BID on 5/28,  decreased to 22 on 6/3, decreased to 18 BID on 6/5, decreased to 10 BID on 6/9  NovoLog 3 units 3 times daily started on 5/26, increased to 5 TID on 5/28, increased to 8 3 times daily on 6/6, decreased to 5 3 times daily on 6/15   CBG (last 3)  Recent Labs    03/04/20 1731 03/04/20 2124 03/05/20 0609  GLUCAP 140* 95 129*   Labile 6/16 9.  Seizure prophylaxis. Keppra 500 mg twice daily, decreased to 250 twice daily on 5/25, d/ced on 5/28 10.  Arrhythmia/wide-complex tachycardia.  Amiodarone 200 mg daily x4 weeks, completed on 6/12.  Follow-up cardiology services 11.  Hypertension with orthostasis.    Nimotop decreased on 6/9, DC'd on 6/11.  TEDS and abdominal binder as needed  Lisinopril 2.5 nightly started on 6/14, increased to 7.5 on 6/15   Vitals:   03/05/20 0403 03/05/20 0406  BP: (!) 172/82 (!) 166/87  Pulse: 81 83  Resp: 16   Temp: 98 F (36.7 C)   SpO2: 99%    Labile, but improving on 6/16 12.  Hyperlipidemia.  Crestor/fenofibrate 13. Mild vasospasm evident on 5/14 vascular transcranial doppler. 14. Spasticity:  Started baclofen 5mg  TID for pain relief and spasticity, d/ced on 6/2 15.  Hyponatremia: Resolved  Sodium 139 on 5/28  Continue to monitor 16. Transaminitis: Resolved  LFTs WNL on 5/28  Continue to monitor 17.  Acute blood loss anemia  Hemoglobin 11.8 on 6/7  Continue to monitor  LOS: 32 days A FACE TO FACE EVALUATION WAS PERFORMED  Izyk Marty 8/7 03/05/2020, 9:22 AM

## 2020-03-05 NOTE — Plan of Care (Signed)
  Problem: RH Balance Goal: LTG: Patient will maintain dynamic sitting balance (OT) Description: LTG:  Patient will maintain dynamic sitting balance with assistance during activities of daily living (OT) 03/05/2020 1119 by Earle Gell, OT Flowsheets (Taken 03/05/2020 1119) LTG: Pt will maintain dynamic sitting balance during ADLs with: (LTG downgraded due to limited progress) Moderate Assistance - Patient 50 - 74% 03/05/2020 1119 by Earle Gell, OT Flowsheets (Taken 03/05/2020 1119) LTG: Pt will maintain dynamic sitting balance during ADLs with: (LTG downgraded due to limited progress) Moderate Assistance - Patient 50 - 74% 03/05/2020 1112 by Earle Gell, OT Flowsheets (Taken 03/05/2020 1110) LTG: Pt will maintain dynamic sitting balance during ADLs with: Moderate Assistance - Patient 50 - 74%   Problem: RH Balance Goal: LTG Patient will maintain dynamic standing with ADLs (OT) Description: LTG:  Patient will maintain dynamic standing balance with assist during activities of daily living (OT)  Flowsheets (Taken 03/05/2020 1110) LTG: Pt will maintain dynamic standing balance during ADLs with: (LTG discontinued due to limited progress) --   Problem: Sit to Stand Goal: LTG:  Patient will perform sit to stand in prep for activites of daily living with assistance level (OT) Description: LTG:  Patient will perform sit to stand in prep for activites of daily living with assistance level (OT) Flowsheets (Taken 03/05/2020 1112) LTG: PT will perform sit to stand in prep for activites of daily living with assistance level: (LTG downgraded due to limited progress) Maximal Assistance - Patient 25 - 49%   Problem: RH Grooming Goal: LTG Patient will perform grooming w/assist,cues/equip (OT) Description: LTG: Patient will perform grooming with assist, with/without cues using equipment (OT) Flowsheets (Taken 03/05/2020 1112) LTG: Pt will perform grooming with assistance level of: (LTG downgraded  due to limited progress) Moderate Assistance - Patient 50 - 74%   Problem: RH Bathing Goal: LTG Patient will bathe all body parts with assist levels (OT) Description: LTG: Patient will bathe all body parts with assist levels (OT) Flowsheets (Taken 03/05/2020 1112) LTG: Pt will perform bathing with assistance level/cueing: (LTG downgraded due to limited progress) Moderate Assistance - Patient 50 - 74%   Problem: RH Dressing Goal: LTG Patient will perform upper body dressing (OT) Description: LTG Patient will perform upper body dressing with assist, with/without cues (OT). Flowsheets (Taken 03/05/2020 1112) LTG: Pt will perform upper body dressing with assistance level of: (LTG downgraded due to limited progress) Moderate Assistance - Patient 50 - 74%   Problem: RH Toilet Transfers Goal: LTG Patient will perform toilet transfers w/assist (OT) Description: LTG: Patient will perform toilet transfers with assist, with/without cues using equipment (OT) Flowsheets (Taken 03/05/2020 1112) LTG: Pt will perform toilet transfers with assistance level of: (LTG discontinued due to incontinence) --   Problem: RH Tub/Shower Transfers Goal: LTG Patient will perform tub/shower transfers w/assist (OT) Description: LTG: Patient will perform tub/shower transfers with assist, with/without cues using equipment (OT) Flowsheets (Taken 03/05/2020 1112) LTG: Pt will perform tub/shower stall transfers with assistance level of: (LTG discontinued due to safety concerns) --

## 2020-03-06 ENCOUNTER — Inpatient Hospital Stay (HOSPITAL_COMMUNITY): Payer: BC Managed Care – PPO | Admitting: Physical Therapy

## 2020-03-06 ENCOUNTER — Inpatient Hospital Stay (HOSPITAL_COMMUNITY): Payer: BC Managed Care – PPO | Admitting: Speech Pathology

## 2020-03-06 ENCOUNTER — Inpatient Hospital Stay (HOSPITAL_COMMUNITY): Payer: BC Managed Care – PPO | Admitting: Occupational Therapy

## 2020-03-06 LAB — GLUCOSE, CAPILLARY
Glucose-Capillary: 179 mg/dL — ABNORMAL HIGH (ref 70–99)
Glucose-Capillary: 196 mg/dL — ABNORMAL HIGH (ref 70–99)
Glucose-Capillary: 219 mg/dL — ABNORMAL HIGH (ref 70–99)
Glucose-Capillary: 275 mg/dL — ABNORMAL HIGH (ref 70–99)

## 2020-03-06 MED ORDER — INSULIN DETEMIR 100 UNIT/ML ~~LOC~~ SOLN
12.0000 [IU] | Freq: Two times a day (BID) | SUBCUTANEOUS | Status: DC
  Administered 2020-03-06 – 2020-03-07 (×3): 12 [IU] via SUBCUTANEOUS
  Filled 2020-03-06 (×4): qty 0.12

## 2020-03-06 MED ORDER — PROPRANOLOL HCL 20 MG PO TABS
30.0000 mg | ORAL_TABLET | Freq: Three times a day (TID) | ORAL | Status: DC
  Administered 2020-03-06 – 2020-03-07 (×5): 30 mg via ORAL
  Filled 2020-03-06 (×5): qty 1

## 2020-03-06 NOTE — Progress Notes (Signed)
Patient ID: Nicholas [name], male   DOB: 08-08-1951, 69 y.o.   MRN: [id number]  Sw received telephone notification that they left spouse a voicemail on yesterday/today for returned phone call to schedule delivery.   Call back number: [phone]

## 2020-03-06 NOTE — Progress Notes (Signed)
Patient ID: ELDRIGE PITKIN, male   DOB: 01/22/1951, 69 y.o.   MRN: [id number]  SW returned spouse phone call this morning. Spouse informed SW [employer] informed her they accepted patient's insurance. SW attempted to explain to spouse that company accepts patient insurance, but cannot accept his current needs. Spouse became very rude with sw screaming. Spouse then stated "well when am I going to hear from [facility]" SW attempted to inform spouse HH begins 24 to 48 hours after discharge. Also, that discharge would be set once DME delivery is scheduled. Spouse yells out "speak to her like she's stupid". SW apologized for not ensuring she was understanding and continued to attempt to educate spouse.   SW will attempt to call spouse back with supervisor.

## 2020-03-06 NOTE — Progress Notes (Signed)
Palliative Medicine RN Note: Rec'd a call from Nicholas [name], family friend who is helping wife Nicholas [name] with discharge. Nicholas [name] is concerned that because there is no bed at home, if Nicholas [name] is discharged, Nicholas [name] will have to bring him right back to the ED.  I messaged SW Nicholas [name], Nicholas [name], and Nicholas [name] to let them know of these concerns. Nicholas [name] gave me the number for Nicholas [name] to call for DME, which I gave to Nicholas [name]. Nicholas [name] will be available for further concerns of Nicholas [name]'s; Nicholas [name] won't be discharged until DME is in place.  Nicholas Chance Jhonny Calixto, RN, BSN, [name] [place] Palliative Medicine Team 03/06/2020 11:05 AM Office [phone]

## 2020-03-06 NOTE — Progress Notes (Signed)
Physical Therapy Weekly Progress Note  Patient Details  Name: Nicholas [name] MRN: [id number] Date of Birth: 08-Nov-1950  Beginning of progress report period: February 27, 2020 End of progress report period: March 06, 2020  Today's Date: 03/06/2020 PT Individual Time: 3953-2023 PT Individual Time Calculation (min): 30 min   Patient has met 0 of 4 short term goals.  He was going to DC to SNF however he was denied coverage for a SNF stay per insurance. Continue to work towards [place] until [place]. Progress very limited by cognition at this point.   Patient continues to demonstrate the following deficits muscle weakness, decreased cardiorespiratoy endurance, motor apraxia, decreased coordination and decreased motor planning, decreased midline orientation, decreased attention to left, left side neglect and decreased motor planning, decreased initiation, decreased attention, decreased awareness, decreased problem solving, decreased safety awareness, decreased memory and delayed processing and decreased sitting balance, decreased standing balance, decreased postural control, hemiplegia and decreased balance strategies and therefore will continue to benefit from skilled PT intervention to increase functional independence with mobility.  Patient not progressing toward long term goals.  See goal revision..  Continue plan of care.  PT Short Term Goals Week 4:  PT Short Term Goal 1 (Week 4): STG = LTGs due to remaining LOS PT Short Term Goal 1 - Progress (Week 4): Not progressing Week 5:  PT Short Term Goal 1 (Week 5): STG = LTGs due to ELOS  Skilled Therapeutic Interventions/Progress Updates:     Patient received in bed, very flat cognition and A&O to self and year only, reoriented as appropriate and repeatedly during session. Seemed very pale and with shallow mouth breathing, all vitals WNL nad RN present to assess and confirm patient status as being OK. Attempted rolling side to side and multiple supine to sit  transfers but uanble to really complete any mobility today due to pain in left hip. Palpated L hip very deeply without any signs of pain from patient, however when asked where he hurts during mobility he states "where I had the injection". Very confused and with virtually no initiation, awareness, attention, and very slow processing today. Session limited by pain and 15 minutes lost. Left in bed with RN present and attending.   Therapy Documentation Precautions:  Precautions Precautions: None Precaution Comments: Lt hemiparesis with Lt neglect ; watch BP Restrictions Weight Bearing Restrictions: No Other Position/Activity Restrictions: keep BP >160 General:   Vital Signs:  Pain: Pain Assessment Pain Scale: Faces Faces Pain Scale: Hurts whole lot Pain Type: Chronic pain Pain Location: Hip Pain Orientation: Left Pain Descriptors / Indicators: Aching;Sharp Pain Onset: On-going Patients Stated Pain Goal: 0 Pain Intervention(s): RN made aware;Repositioned Multiple Pain Sites: No    Therapy/Group: Individual Therapy   Windell Norfolk, DPT, PN1   Supplemental Physical Therapist [place]    Pager [phone] Acute Rehab Office [phone]    03/06/2020, 12:44 PM

## 2020-03-06 NOTE — Progress Notes (Signed)
Speech Language Pathology Daily Session Note  Patient Details  Name: Nicholas [name] MRN: [id number] Date of Birth: December 22, 1950  Today's Date: 03/06/2020 SLP Individual Time: 0131-4388 SLP Individual Time Calculation (min): 48 min  Short Term Goals: Week 5: SLP Short Term Goal 1 (Week 5): STG=LTG due to remaining LOS  Skilled Therapeutic Interventions: Pt was seen for skilled ST targeting cognition. Pt was difficult to engage today, with flat affect, but denied pain. He was minimally verbal, limited to yes/no responses to answer therapist's questions regarding his needs and preferences throughout session. SLP facilitated session with Max A verbal and visual cues for basic problem solving, visual scanning, sustained attention, and sequencing during functional tasks such as meal consumption. Pt required consistent cues to sequence steps of self feeding (ex: set your cup down, pick up the chip, bring it to your lips), however he did comprehend and follow all directions. Pt left laying in bed with alarm set and needs met to his satisfaction. Continue per current plan of care.        Pain Pain Assessment Pain Scale: 0-10 Pain Score: 0-No pain   Therapy/Group: Individual Therapy  Arbutus Leas 03/06/2020, 7:24 AM

## 2020-03-06 NOTE — Progress Notes (Signed)
[facility] PHYSICAL MEDICINE & REHABILITATION PROGRESS NOTE   Subjective/Complaints: Patient seen laying in bed this morning.  He indicates he slept well overnight.  No reported issues overnight, discussed with nursing.  Discussed with medical director of insurance company, patient denied for SNF.  ROS: Limited due to cognition  Objective:   No results found. No results for input(s): WBC, HGB, HCT, PLT in the last 72 hours. No results for input(s): NA, K, CL, CO2, GLUCOSE, BUN, CREATININE, CALCIUM in the last 72 hours.  Intake/Output Summary (Last 24 hours) at 03/06/2020 0905 Last data filed at 03/05/2020 1400 Gross per 24 hour  Intake 0 ml  Output --  Net 0 ml     Physical Exam: Vital Signs Blood pressure (!) 165/98, pulse 78, temperature 98.3 F (36.8 C), resp. rate 16, height 6' (1.829 m), weight 89.8 kg, SpO2 99 %. Constitutional: No distress . Vital signs reviewed. HENT: Normocephalic.  Atraumatic. Eyes: EOMI. No discharge. Cardiovascular: No JVD.  RRR. Respiratory: Normal effort.  No stridor.  Bilaterally clear to auscultation. GI: Non-distended. Skin: Warm and dry.  Intact. Psych: Confused.  Distracted.  Slowed. Musc: No edema in extremities.  No tenderness in extremities. Neuro: Alert Motor:  Motor: LUE: 4 --4/5 proximal distal with apraxia, unchanged LLE: 3/5 proximal distal, appears stable  Assessment/Plan: 1. Functional deficits secondary to [facility] which require 3+ hours per day of interdisciplinary therapy in a comprehensive inpatient rehab setting.  Physiatrist is providing close team supervision and 24 hour management of active medical problems listed below.  Physiatrist and rehab team continue to assess barriers to discharge/monitor patient progress toward functional and medical goals  Care Tool:  Bathing    Body parts bathed by patient: Right arm, Left arm, Chest, Abdomen, Front perineal area, Right upper leg, Left upper leg, Face   Body parts bathed by  helper: Buttocks, Right lower leg, Left lower leg Body parts n/a: Chest, Abdomen, Front perineal area, Buttocks   Bathing assist Assist Level: Moderate Assistance - Patient 50 - 74%     Upper Body Dressing/Undressing Upper body dressing   What is the patient wearing?: Pull over shirt    Upper body assist Assist Level: Moderate Assistance - Patient 50 - 74%    Lower Body Dressing/Undressing Lower body dressing      What is the patient wearing?: Pants, Incontinence brief     Lower body assist Assist for lower body dressing: Maximal Assistance - Patient 25 - 49% (bed level)     Toileting Toileting Toileting Activity did not occur (Clothing management and hygiene only): N/A (no void or bm)  Toileting assist Assist for toileting: Total Assistance - Patient < 25%     Transfers Chair/bed transfer  Transfers assist     Chair/bed transfer assist level: 2 Helpers     Locomotion Ambulation   Ambulation assist   Ambulation activity did not occur: Safety/medical concerns (fall risk/processing)  Assist level: 2 helpers Assistive device: Parallel bars Max distance: 4 ft   Walk 10 feet activity   Assist  Walk 10 feet activity did not occur: Safety/medical concerns (fall risk/processing)        Walk 50 feet activity   Assist Walk 50 feet with 2 turns activity did not occur: Safety/medical concerns (fall risk/processing)         Walk 150 feet activity   Assist Walk 150 feet activity did not occur: Safety/medical concerns (fall risk/processing)         Walk 10 feet on  uneven surface  activity   Assist Walk 10 feet on uneven surfaces activity did not occur: Safety/medical concerns (fall risk/processing)         Wheelchair     Assist Will patient use wheelchair at discharge?: Yes Type of Wheelchair: Manual    Wheelchair assist level: Dependent - Patient 0% Max wheelchair distance: 150 ft    Wheelchair 50 feet with 2 turns  activity    Assist        Assist Level: Dependent - Patient 0%   Wheelchair 150 feet activity     Assist      Assist Level: Dependent - Patient 0%    Medical Problem List and Plan: 1.  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  Status post cerebral angiogram balloon angioplasty  Continue CIR, restart therapies  Bilateral PRAFO  Patient will require specialty wheelchair for mobility given cognitive and physical deficits. 2.  Antithrombotics: -DVT/anticoagulation: Lovenox initiated 01/29/2020             -antiplatelet therapy: Aspirin 325 mg daily 3. Pain Management: Oxycodone as needed  Robaxin prn with scheduled qhs  Changed Oxycodone to Norco 7.5 mg/325 mg q4 hours per son request since been so sedated.  Muscle rub ordered on 5/27, scheduled may be used for neck low back and buttock area  Controlled on 6/17 4. Mood: Provide emotional support             -antipsychotic agents: N/A  Zoloft daily.   Amantadine 100 with breakfast and lunch, decreased to 50 on 6/10 and monitor for improvement in restlessness/distraction, DC'd on 6/12.,  Will consider restarting.  Propranolol started on 6/13, increased to 30 3 times daily on 6/17 5. Neuropsych: This patient is not capable of making decisions on his own behalf. 6. Skin/Wound Care: Routine skin checks 7. Fluids/Electrolytes/Nutrition: Routine in and outs   BMP within acceptable range on 6/7, labs ordered for tomorrow  Carb modified diet 8.  Diabetes mellitus with hyperglycemia.  Hemoglobin A1c 7.7.  Check blood sugars before meals and at bedtime.  Patient on Tresiba 35 units daily, Glucotrol 10 mg daily, Actos 45 mg daily, Janumet 50-1000 mg twice daily, NovoLog 75 units daily prior to admission.               Levemir 31U BID, decreased to 28 twice daily on 5/26, decreased to 25 BID on 5/28, decreased to 22 on 6/3, decreased to 18 BID on 6/5, decreased to 10 BID on 6/9, increased to 12 twice daily on  6/17  NovoLog 3 units 3 times daily started on 5/26, increased to 5 TID on 5/28, increased to 8 3 times daily on 6/6, decreased to 5 3 times daily on 6/15   CBG (last 3)  Recent Labs    03/05/20 1801 03/05/20 2109 03/06/20 0602  GLUCAP 168* 227* 179*   Labile 6/17 9.  Seizure prophylaxis. Keppra 500 mg twice daily, decreased to 250 twice daily on 5/25, d/ced on 5/28 10.  Arrhythmia/wide-complex tachycardia.  Amiodarone 200 mg daily x4 weeks, completed on 6/12.  Follow-up cardiology services 11.  Hypertension with orthostasis.    Nimotop decreased on 6/9, DC'd on 6/11.  TEDS and abdominal binder as needed  Lisinopril 2.5 nightly started on 6/14, increased to 7.5 on 6/15  See #4   Vitals:   03/05/20 2010 03/06/20 0339  BP: 135/73 (!) 165/98  Pulse: 87 78  Resp: 18 16  Temp: 97.8 F (36.6  C) 98.3 F (36.8 C)  SpO2: 97% 99%   Labile, but improving on 6/17 12.  Hyperlipidemia.  Crestor/fenofibrate 13. Mild vasospasm evident on 5/14 vascular transcranial doppler. 14. Spasticity:  Started baclofen 5mg  TID for pain relief and spasticity, d/ced on 6/2 15.  Hyponatremia: Resolved  Sodium 139 on 5/28  Continue to monitor 16. Transaminitis: Resolved  LFTs WNL on 5/28  Continue to monitor 17.  Acute blood loss anemia  Hemoglobin 11.8 on 6/7, labs ordered for tomorrow  Continue to monitor  LOS: 33 days A FACE TO FACE EVALUATION WAS PERFORMED  Negar Sieler 8/7 03/06/2020, 9:05 AM

## 2020-03-06 NOTE — Progress Notes (Signed)
Occupational Therapy Session Note  Patient Details  Name: Nicholas [name] MRN: [id number] Date of Birth: April 09, 1951  Today's Date: 03/06/2020 OT Individual Time: 1660-6301 OT Individual Time Calculation (min): 45 min    Short Term Goals: Week 1:  OT Short Term Goal 1 (Week 1): Pt will maintain static sitting balance (unsupported) with min A in prep for dyanamic sititng balance in ADL with OT Short Term Goal 1 - Progress (Week 1): Progressing toward goal OT Short Term Goal 2 (Week 1): Pt will don shirt with mod A OT Short Term Goal 2 - Progress (Week 1): Progressing toward goal OT Short Term Goal 3 (Week 1): Pt will perform transfer of max A +1 with LRAD method OT Short Term Goal 3 - Progress (Week 1): Progressing toward goal OT Short Term Goal 4 (Week 1): PT will perform bed mobility in prep for ADL tasks with max A+1 (rolling/ supine to sit) OT Short Term Goal 4 - Progress (Week 1): Met Week 2:  OT Short Term Goal 1 (Week 2): Pt will maintain static sitting balance (unsupported) with min A in prep for dyanamic sititng balance in ADL OT Short Term Goal 1 - Progress (Week 2): Met OT Short Term Goal 2 (Week 2): Pt will don shirt with mod assist. OT Short Term Goal 2 - Progress (Week 2): Met OT Short Term Goal 3 (Week 2): Pt will perform sliding board transfers with max assist +1. OT Short Term Goal 3 - Progress (Week 2): Met OT Short Term Goal 4 (Week 2): Pt will wash UB with mod assist using BUE. OT Short Term Goal 4 - Progress (Week 2): Met Week 3:  OT Short Term Goal 1 (Week 3): Pt will be able to maintain his balance at EOB with supervision while completing UB bathing to demonstrate improved postural control. OT Short Term Goal 1 - Progress (Week 3): Progressing toward goal OT Short Term Goal 2 (Week 3): Pt will be able to don a shirt with min A. OT Short Term Goal 2 - Progress (Week 3): Progressing toward goal OT Short Term Goal 3 (Week 3): Pt will be able to push from sit to stand with  max A of 1 to prepare for clothing management over hips. OT Short Term Goal 3 - Progress (Week 3): Progressing toward goal OT Short Term Goal 4 (Week 3): Pt will be able to use slide board with mod A of 1 and 2nd person to stabilize the board. OT Short Term Goal 4 - Progress (Week 3): Progressing toward goal Week 4:  OT Short Term Goal 1 (Week 4): Pt will be able to right to midline sitting EOB with min assist while bathing UB. OT Short Term Goal 2 (Week 4): Pt will be able to stand with mod assist + 1 in order to pull pants over hips. OT Short Term Goal 3 (Week 4): Pt will complete sliding board transfer with mod assist and second helper to manage sliding board. OT Short Term Goal 4 (Week 4): Pt will be able to complete oral hygiene and grooming with CGA and ability to right to midline with mod multimodal cues.     Skilled Therapeutic Interventions/Progress Updates:    Pt received in bed finishing breakfast with NT assisting him.   Pt needed to have his clothing changed.  He was very stiff this morning and many of the movement patterns needed (flexing trunk, turning head to L, reaching forward, lifting hips) were inducing pain and  pt was displaying resistive movement patterns.  He expressed extreme pain with "ouch" and grimacing with slight L hip flexion.  Pt needed to be moved passively as he was NOT initiating any functional movement with doffing old clothing and donning new clothing.  He required total A with all self care.  After donning new clothing, therapist smelled a strong odor.   Therapist asked pt if he had a bowel movement and pt stated he did not think so.  Pt did in fact have one.  Clothing doffed, pt cleansed and changed and pants redonned for pt.  He c/o pain often with the rolling from side to side.  1 x pt reached and touched this therapist's chest. Therapist just pushed pt's hand down and redirected him.  Unsure if this was inappropriate touch or if pt did not see where he was  reaching.  Pt resting in bed with all needs met.  Therapy Documentation Precautions:  Precautions Precautions: None Precaution Comments: Lt hemiparesis with Lt neglect ; watch BP Restrictions Weight Bearing Restrictions: No Other Position/Activity Restrictions: keep BP >160   Pain: Pain Assessment Pain Scale: Faces Pain Score: 0-No pain Faces Pain Scale: Hurts whole lot Pain Type: Chronic pain Pain Location: Hip Pain Orientation: Left Pain Descriptors / Indicators: Aching;Sharp Pain Onset: With Activity Pain Intervention(s): Repositioned      Therapy/Group: Individual Therapy  [place] 03/06/2020, 9:35 AM

## 2020-03-06 NOTE — Progress Notes (Signed)
Patient ID: Nicholas [name], male   DOB: Dec 12, 1950, 69 y.o.   MRN: [id number]   Sw received notification from [facility] [place] that patient cognition level is to low currently for tilt and space wheelchair. Patient evaluated on Monday, patient evaluated again today and patient cognition has declined since. PT feels it would be appropriate for patient to wait to begin PT at home. Once PT has began working with patient they can inform Barbara Cower if patient has become appropriate at that point. Barbara Cower has all information needed to submit order.   Sw will provide [facility] with [facility] contact information.  From PT standpoint patient will be going home bed bound until appropriate for wheelchair.

## 2020-03-06 NOTE — Progress Notes (Signed)
Patient ID: Nicholas [name], male   DOB: September 08, 1951, 69 y.o.   MRN: [id number]   SW and MD received secure chat from: Mariana Arn, RN (palliative) stating that she spoke to spouse's friend Maralyn Sago and wanted to inform us patient could not D/C home without DME and that he would return to hospital without bed.  Sw informed Shawna Orleans, RN that team was aware and d/c would be set once spouse arranges scheduling with [facility]. SW also provided contact information to [employer] to [place]. She states she will pass along this information to Maralyn Sago as well.   [employer] Callback: [phone]

## 2020-03-06 NOTE — Discharge Instructions (Signed)
Inpatient Rehab Discharge Instructions  Nicholas [name] Discharge date and time: No discharge date for patient encounter.   Activities/Precautions/ Functional Status: Activity: activity as tolerated Diet: Diabetic diet Wound Care: none needed Functional status:  ___ No restrictions     ___ Walk up steps independently ___ 24/7 supervision/assistance   ___ Walk up steps with assistance ___ Intermittent supervision/assistance  ___ Bathe/dress independently ___ Walk with walker     _x__ Bathe/dress with assistance ___ Walk Independently    ___ Shower independently ___ Walk with assistance    ___ Shower with assistance ___ No alcohol     ___ Return to work/school ________ COMMUNITY REFERRALS UPON DISCHARGE:    Home Health:   PT     OT    ST    RN    SNA    SW                   Agency: [facility]  Phone: [phone]    Medical Equipment/Items Ordered: Hospital Bed, Specialty Wheelchair, 16x20 Roho Cushion, [employer] Lift                                                 Agency/Supplier: [employer]  Special Instructions: PCP follow up with: [facility] (Dr. Therisa Doyne) No driving smoking or alcohol    My questions have been answered and I understand these instructions. I will adhere to these goals and the provided educational materials after my discharge from the hospital.  Patient/Caregiver Signature _______________________________ Date __________  Clinician Signature _______________________________________ Date __________  Please bring this form and your medication list with you to all your follow-up doctor's appointments.

## 2020-03-06 NOTE — Progress Notes (Signed)
[facility]:  [facility]  Pt has been approved for home care hospice with our services. I have reached out to the spouse to discuss goals, services and equipment needs. However, unable to leave a voice mail  and no answer.  Will follow up tomorrow.   Norm Parcel RN [phone]

## 2020-03-06 NOTE — Progress Notes (Signed)
Patient ID: Nicholas [name], male   DOB: [birth year], 69 y.o.   MRN: [id number]   Sw contacted [facility] [place] per spouse request. Drenda Freeze in admission will begin reviewing patient information and follow back up with SW.

## 2020-03-06 NOTE — Progress Notes (Addendum)
Palliative Care Progress Note Reason: Care Coordination, Symptom Management, Continuity Visit  Nicholas [name] is a 69 yo man who had a spontaneous SAH and Subsequent subdural bleed on 4/26 with a complicated hospitalization due to vasospasm and persistent hypertension. He was admitted to CIR on 5/15 with what was presumed to be a good outlook for rehabilitation. We were consulted at the time of his hospital admission for advance care planning and holistic serious illness support. His wife requested re-consultation from palliative care services on 6/16 due to concerns about his progress on acute rehab, difficult to manage post-stroke symptoms and overall concerns about his discharge planning and future prognosis. He was recently denied for additional rehabilitation days by his insurance provider and also after peer-to-peer. She wants him to have ongoing specialized rehabilitation services to maximize his function and to preserve or restore as much QOL as he can given the outcomes to date since his stroke -now at 6 weeks.  Nicholas [name] had a very high functional status prior to his stroke and this has been devastating for him and his family. By chart review and from discussion with his wife his rehabilitation has been extremely difficult and he has struggled to make progress due to difficult to manage cognitive and neuromuscular post-stroke symptoms. She reports feeling unprepared to care for him at home and also feels like she does not have a good grasp on what his possible prognosis is for additional recovery or what to expect in this transition. She is clear that Nicholas [name] would not want his life unnecessarily prolonged if he could not regain at least some of his independence and be able to experience joy and happiness in his life.  I spoke with his wife by phone and evaluated Nicholas [name] this evening. He has lost weight since I last saw him on Neuro-ICU. When I arrived in his room he was lying in bed and appeared to be in moderate  distress. He was shaking his head back and forth in a rhythmic pattern, but he was sleeping deeply. I had difficulty waking him fully but allowed him to continue to rest. His wife is concerned about his increased lethargy over the past few days-she feels it is different and is requesting this be addressed. We also discussed Code Status and wishes should he experience a cardiac arrest.  I provided support to [facility] who is grieving this loss and coming to terms with her role as caregiver to her husband- her children have been a huge support to them both.  Recommendations:  1. DNR/DNI, full scope medical interventions for reversible illness 2. Aggressive Management of Cognitive and Dyskinetic Post Stroke Symptoms 3. Explore all possible resources to support his care at home including provision of primary care services, ongoing PT/Rehabilitation in the home, equipment in the home, caregiver education and crisis management plan. 4. I would support additional skilled rehabilitation days to further manage his symptoms of lethargy, pain and dyskinetics.  Anderson Malta, DO Palliative Medicine [phone]

## 2020-03-07 LAB — GLUCOSE, CAPILLARY
Glucose-Capillary: 127 mg/dL — ABNORMAL HIGH (ref 70–99)
Glucose-Capillary: 222 mg/dL — ABNORMAL HIGH (ref 70–99)
Glucose-Capillary: 271 mg/dL — ABNORMAL HIGH (ref 70–99)
Glucose-Capillary: 242 mg/dL — ABNORMAL HIGH (ref 70–99)

## 2020-03-07 LAB — BASIC METABOLIC PANEL
Anion gap: 8 (ref 5–15)
BUN: 24 mg/dL — ABNORMAL HIGH (ref 8–23)
CO2: 30 mmol/L (ref 22–32)
Calcium: 9.5 mg/dL (ref 8.9–10.3)
Chloride: 102 mmol/L (ref 98–111)
Creatinine, Ser: 0.67 mg/dL (ref 0.61–1.24)
GFR calc Af Amer: >60 mL/min (ref >60)
GFR calc non Af Amer: >60 mL/min (ref >60)
Glucose, Bld: 132 mg/dL — ABNORMAL HIGH (ref 70–99)
Potassium: 3.5 mmol/L (ref 3.5–5.1)
Sodium: 140 mmol/L (ref 135–145)

## 2020-03-07 LAB — CBC WITH DIFFERENTIAL/PLATELET
Abs Immature Granulocytes: 0.02 10*3/uL (ref 0.00–0.07)
Basophils Absolute: 0.1 10*3/uL (ref 0.0–0.1)
Basophils Relative: 1 %
Eosinophils Absolute: 0.1 10*3/uL (ref 0.0–0.5)
Eosinophils Relative: 1 %
HCT: 41.7 % (ref 39.0–52.0)
Hemoglobin: 13.2 g/dL (ref 13.0–17.0)
Immature Granulocytes: 0 %
Lymphocytes Relative: 24 %
Lymphs Abs: 1.9 10*3/uL (ref 0.7–4.0)
MCH: 29.3 pg (ref 26.0–34.0)
MCHC: 31.7 g/dL (ref 30.0–36.0)
MCV: 92.7 fL (ref 80.0–100.0)
Monocytes Absolute: 0.7 10*3/uL (ref 0.1–1.0)
Monocytes Relative: 9 %
Neutro Abs: 5.2 10*3/uL (ref 1.7–7.7)
Neutrophils Relative %: 65 %
Platelets: 289 10*3/uL (ref 150–400)
RBC: 4.5 MIL/uL (ref 4.22–5.81)
RDW: 14.6 % (ref 11.5–15.5)
WBC: 8.1 10*3/uL (ref 4.0–10.5)
nRBC: 0 % (ref 0.0–0.2)

## 2020-03-07 MED ORDER — PROPRANOLOL HCL 10 MG PO TABS: 30.0000 mg | ORAL_TABLET | Freq: Three times a day (TID) | ORAL | 0 refills | Status: AC

## 2020-03-07 MED ORDER — LISINOPRIL 5 MG PO TABS: 15.0000 mg | ORAL_TABLET | Freq: Every day | ORAL | 0 refills | Status: AC

## 2020-03-07 MED ORDER — LISINOPRIL 5 MG PO TABS
15.0000 mg | ORAL_TABLET | Freq: Every day | ORAL | Status: DC
  Administered 2020-03-07: 15 mg via ORAL
  Filled 2020-03-07: qty 1

## 2020-03-07 MED ORDER — AMANTADINE HCL 50 MG/5ML PO SYRP: 100.0000 mg | ORAL_SOLUTION | Freq: Two times a day (BID) | ORAL | 0 refills | Status: AC

## 2020-03-07 NOTE — Progress Notes (Signed)
Spoke with wife Nicholas [name] regarding ambulance policy regarding code status for transport. Pt's wife agreed to transport and aware that if patient declines CPR will be performed.

## 2020-03-07 NOTE — Progress Notes (Signed)
Speech Language Pathology Discharge Summary  Patient Details  Name: Nicholas [name] MRN: [id number] Date of Birth: 07-Jun-1951  Patient has met  (0) of 3 long term goals.  Patient to discharge at overall Max level.  Reasons goals not met: Pt required Max A   Clinical Impression/Discharge Summary:   Nicholas [name] made minimal slow gains during his admission, and as a result most treatment goals were downgraded during his stay. Although he had planned to d/c 03/04/20 to SNF, plans did not go through and goals were updated to target on QD basis while new d/c plans were pending. Pt met 0 out of 3 newly instated goals, which were set at Mod A level. Pt currently requires Max assist for basic cognitive tasks due to impairments impacting his sustained attention, problem solving, intellectual and emergent awareness, and short term memory. He also demonstrates language of confusion, disorientation to time and occasionally to place and situation; confusion has increased over the last 2 days. His processing speed is delayed, requiring extra time and sometimes repetition to provide responses and follow directions. However, pt demonstrates improved verbal initiation since admission, as well as total verbal output and response time to staff and therapists' questions. Due to severity of cognitive deficits, carryover of new learning has been challenging for Nicholas [name] during this admission. As a result, pt will require strict 24/7 supervision and hands on care upon d/c. Therapy team (including ST) recommends SNF as safest d/c plan and next venue of care, however pt's family has chosen to take pt home with hospice care. Recommend pt continue to receive skilled ST services at discharge. Pt and family education has been ongoing, as they have been present for nearly every ST session, and is complete at this time.  Care Partner:  Caregiver Able to Provide Assistance: Yes  Type of Caregiver Assistance: Cognitive  Recommendation:  24 hour  supervision/assistance;Skilled Nursing facility  Rationale for SLP Follow Up: Maximize cognitive function and independence;Reduce caregiver burden;Maximize functional communication   Equipment: none   Reasons for discharge: Discharged from hospital   Patient/Family Agrees with Progress Made and Goals Achieved: Yes    Arbutus Leas 03/07/2020, 3:01 PM

## 2020-03-07 NOTE — Progress Notes (Signed)
Patient ID: JOE GEE, male   DOB: 16-Jun-1951, 69 y.o.   MRN: [id number]   SW spoke with Cheri. Her MD will handle medications. SW will leave discharge package at nursing station. Cheri will schedule [facility] once DME has been delivered. Nurse aware. Also provided Cheri with nursing station number.

## 2020-03-07 NOTE — Progress Notes (Signed)
Patient ID: Nicholas [name], male   DOB: February 20, 1951, 69 y.o.   MRN: [id number]   SW updated by Dennard Nip. Spouse reports to have been sleeping. Cheri ordered patient DME.Marland Kitchen Spouse expected to schedule delivery with team. Once spouse sets delivery SW will set [facility] transfer.   If patient transports after 3PM, Dennard Nip will contact [facility] to set transfer. Patient expected to discharge today pending upon DME scheduling by spouse.   Will follow up with staff

## 2020-03-07 NOTE — Progress Notes (Signed)
Patient ID: Nicholas [name], male   DOB: 11/11/1950, 69 y.o.   MRN: [id number]  Update from Norm Parcel:  "Good morning-- I have reached out multiple times to the wife-- Can not leave a VM on home phone and left VM on cell phone with no return call as of yet. The pt has been approved for services but I have not been ablet o confirm equipment needs to have delivered yet. Just wanted you to know that we will continue to follow"

## 2020-03-07 NOTE — Progress Notes (Signed)
[facility] PHYSICAL MEDICINE & REHABILITATION PROGRESS NOTE   Subjective/Complaints: Patient seen laying in bed this morning.  He states he slept well overnight.  No reported issues overnight.  He was seen by palliative care yesterday, communication exchanged-regarding hospice.  He is slightly quicker to respond.    ROS: Limited due to cognition.  Objective:   No results found. Recent Labs    03/07/20 0445  WBC 8.1  HGB 13.2  HCT 41.7  PLT 289   Recent Labs    03/07/20 0445  NA 140  K 3.5  CL 102  CO2 30  GLUCOSE 132*  BUN 24*  CREATININE 0.67  CALCIUM 9.5   No intake or output data in the 24 hours ending 03/07/20 0907   Physical Exam: Vital Signs Blood pressure (!) 195/95, pulse 71, temperature 98 F (36.7 C), resp. rate 16, height 6' (1.829 m), weight 89.8 kg, SpO2 99 %. Constitutional: No distress . Vital signs reviewed. HENT: Normocephalic.  Atraumatic. Eyes: EOMI. No discharge. Cardiovascular: No JVD.  RRR. Respiratory: Normal effort.  No stridor.  Bilaterally clear to auscultation. GI: Non-distended. Skin: Warm and dry.  Intact. Psych: Confused.  Distracted. Musc: No edema in extremities.  No tenderness in extremities. Neuro: Alert Motor:  Motor: LUE: 4 --4/5 proximal distal with apraxia, unchanged LLE: 3/5 proximal distal, unchanged  Assessment/Plan: 1. Functional deficits secondary to [facility] which require 3+ hours per day of interdisciplinary therapy in a comprehensive inpatient rehab setting.  Physiatrist is providing close team supervision and 24 hour management of active medical problems listed below.  Physiatrist and rehab team continue to assess barriers to discharge/monitor patient progress toward functional and medical goals  Care Tool:  Bathing    Body parts bathed by patient: Right arm, Left arm, Chest, Abdomen, Front perineal area, Right upper leg, Left upper leg, Face   Body parts bathed by helper: Buttocks, Right lower leg, Left lower  leg Body parts n/a: Chest, Abdomen, Front perineal area, Buttocks   Bathing assist Assist Level: Moderate Assistance - Patient 50 - 74%     Upper Body Dressing/Undressing Upper body dressing   What is the patient wearing?: Pull over shirt    Upper body assist Assist Level: Dependent - Patient 0%    Lower Body Dressing/Undressing Lower body dressing      What is the patient wearing?: Pants, Incontinence brief     Lower body assist Assist for lower body dressing: Dependent - Patient 0%     Toileting Toileting Toileting Activity did not occur (Clothing management and hygiene only): N/A (no void or bm)  Toileting assist Assist for toileting: Dependent - Patient 0%     Transfers Chair/bed transfer  Transfers assist     Chair/bed transfer assist level: 2 Helpers     Locomotion Ambulation   Ambulation assist   Ambulation activity did not occur: Safety/medical concerns (fall risk/processing)  Assist level: 2 helpers Assistive device: Parallel bars Max distance: 4 ft   Walk 10 feet activity   Assist  Walk 10 feet activity did not occur: Safety/medical concerns (fall risk/processing)        Walk 50 feet activity   Assist Walk 50 feet with 2 turns activity did not occur: Safety/medical concerns (fall risk/processing)         Walk 150 feet activity   Assist Walk 150 feet activity did not occur: Safety/medical concerns (fall risk/processing)         Walk 10 feet on uneven surface  activity  Assist Walk 10 feet on uneven surfaces activity did not occur: Safety/medical concerns (fall risk/processing)         Wheelchair     Assist Will patient use wheelchair at discharge?: Yes Type of Wheelchair: Manual    Wheelchair assist level: Dependent - Patient 0% Max wheelchair distance: 150 ft    Wheelchair 50 feet with 2 turns activity    Assist        Assist Level: Dependent - Patient 0%   Wheelchair 150 feet activity      Assist      Assist Level: Dependent - Patient 0%    Medical Problem List and Plan: 1.  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  Status post cerebral angiogram balloon angioplasty  Continue CIR  Bilateral PRAFO  Patient will require specialty wheelchair for mobility given cognitive and physical deficits.  Wife would like transfer to hospice - approved.  Appreciate Palliative care recs. 2.  Antithrombotics: -DVT/anticoagulation: Lovenox initiated 01/29/2020             -antiplatelet therapy: Aspirin 325 mg daily 3. Pain Management: Oxycodone as needed  Robaxin prn with scheduled qhs  Changed Oxycodone to Norco 7.5 mg/325 mg q4 hours per son request since been so sedated.  Muscle rub ordered on 5/27, scheduled may be used for neck low back and buttock area  Controlled on 6/18 4. Mood: Provide emotional support             -antipsychotic agents: N/A  Zoloft daily.   Amantadine 100 with breakfast and lunch, decreased to 50 on 6/10 and monitor for improvement in restlessness/distraction, DC'd on 6/12., restarted 6/16 at 50.  Propranolol started on 6/13, increased to 30 3 times daily on 6/17 5. Neuropsych: This patient is not capable of making decisions on his own behalf. 6. Skin/Wound Care: Routine skin checks 7. Fluids/Electrolytes/Nutrition: Routine in and outs   BUN elevated, encourage fluids  Carb modified diet 8.  Diabetes mellitus with hyperglycemia.  Hemoglobin A1c 7.7.  Check blood sugars before meals and at bedtime.  Patient on Tresiba 35 units daily, Glucotrol 10 mg daily, Actos 45 mg daily, Janumet 50-1000 mg twice daily, NovoLog 75 units daily prior to admission.               Levemir 31U BID, decreased to 28 twice daily on 5/26, decreased to 25 BID on 5/28, decreased to 22 on 6/3, decreased to 18 BID on 6/5, decreased to 10 BID on 6/9, increased to 12 twice daily on 6/17  NovoLog 3 units 3 times daily started on 5/26, increased to 5 TID on  5/28, increased to 8 3 times daily on 6/6, decreased to 5 3 times daily on 6/15   CBG (last 3)  Recent Labs    03/06/20 1640 03/06/20 2107 03/07/20 0557  GLUCAP 219* 275* 127*   Labile on 6/18 9.  Seizure prophylaxis. Keppra 500 mg twice daily, decreased to 250 twice daily on 5/25, d/ced on 5/28 10.  Arrhythmia/wide-complex tachycardia.  Amiodarone 200 mg daily x4 weeks, completed on 6/12.  Follow-up cardiology services 11.  Hypertension with orthostasis.    Nimotop decreased on 6/9, DC'd on 6/11.  TEDS and abdominal binder as needed  Lisinopril 2.5 nightly started on 6/14, increased to 7.5 on 6/15, increased to 15 on 6/18  See #4   Vitals:   03/06/20 2005 03/07/20 0552  BP: (!) 155/55 (!) 195/95  Pulse: 71 71  Resp:  16 16  Temp: 97.6 F (36.4 C) 98 F (36.7 C)  SpO2: 100% 99%   Labile on 6/18 12.  Hyperlipidemia.  Crestor/fenofibrate 13. Mild vasospasm evident on 5/14 vascular transcranial doppler. 14. Spasticity:  Started baclofen 5mg  TID for pain relief and spasticity, d/ced on 6/2 15.  Hyponatremia: Resolved  Sodium 139 on 5/28  Continue to monitor 16. Transaminitis: Resolved  LFTs WNL on 5/28  Continue to monitor 17.  Acute blood loss anemia: Resolved  Hemoglobin 13.2 on 6/18  Continue to monitor  LOS: 34 days A FACE TO FACE EVALUATION WAS PERFORMED  Malonie Tatum 7/18 03/07/2020, 9:07 AM

## 2020-03-07 NOTE — Progress Notes (Signed)
Brief Note:  Patient scheduled to be discharge to home today with hospice.  Discussed with nursing delays in transportation.  If convalescent transportation not unavailable prior to 11PM will hold discharge today and schedule for tomorrow AM due to concerns for safety and support at home.

## 2020-03-07 NOTE — Progress Notes (Signed)
Patient ID: Nicholas [name], male   DOB: 05-19-1951, 69 y.o.   MRN: [id number]   SW informed [facility] that patient is ready for discharge and would need follow up with family to inform patient is ready. Unable to reach spouse  [facility]D will follow up with SW

## 2020-03-07 NOTE — Progress Notes (Addendum)
Brief note:  Patient discharged home.  Will see patient for transitional care management in 1-2 weeks post-discharge  > 30 minutes spent in total in discharge planning between myself and PA regarding aforementioned, as well discussion regarding DME equipment, follow-up appointments, follow-up therapies, discharge medications, discharge recommendations - see multiple notes from today.

## 2020-03-07 NOTE — Progress Notes (Signed)
Patient ID: Nicholas [name], male   DOB: November 10, 1950, 69 y.o.   MRN: [id number]   SW spoke with [facility] via telephone to inform of patient DME needs. Dennard Nip will continue to attempt to reach spouse. If unable to reach her today they will continue tomorrow.

## 2020-03-07 NOTE — Progress Notes (Signed)
Patient ID: Nicholas [name], male   DOB: [birth year], 69 y.o.   MRN: [id number]  Sw spoke with son Wylder Macomber [phone]. Informed him that we're unable to contact or locate his mother. Son informed SW he would attempt to reach out to his mother

## 2020-03-10 NOTE — Discharge Summary (Addendum)
Physician Discharge Summary  Patient ID: Nicholas [name] MRN: [id number] DOB/AGE: 24-Mar-1951 69 y.o.  Admit date: 02/02/2020 Discharge date: 03/07/2020  Discharge Diagnoses:  Principal Problem:   SDH (subdural hematoma) (HCC) Active Problems:   SAH (subarachnoid hemorrhage) (HCC)   Acute blood loss anemia   Transaminitis   Pressure injury of skin   Orthostasis   Spastic hemiparesis (HCC)   Benign essential HTN   Myalgia   Restlessness Diabetes mellitus Seizure prophylaxis   Discharged Condition: Guarded  Significant Diagnostic Studies: DG Knee Right Port  Result Date: 02/09/2020 CLINICAL DATA:  Chronic knee pain EXAM: PORTABLE RIGHT KNEE - 1-2 VIEW COMPARISON:  None. FINDINGS: No evidence of fracture, dislocation, or joint effusion. No evidence of arthropathy or other focal bone abnormality. Soft tissues are unremarkable. IMPRESSION: No acute abnormality noted. Electronically Signed   By: Alcide Clever M.D.   On: 02/09/2020 14:37    Labs:  Basic Metabolic Panel: Recent Labs  Lab 03/07/20 0445  NA 140  K 3.5  CL 102  CO2 30  GLUCOSE 132*  BUN 24*  CREATININE 0.67  CALCIUM 9.5    CBC: Recent Labs  Lab 03/07/20 0445  WBC 8.1  NEUTROABS 5.2  HGB 13.2  HCT 41.7  MCV 92.7  PLT 289    CBG: Recent Labs  Lab 03/06/20 2107 03/07/20 0557 03/07/20 1235 03/07/20 1625 03/07/20 2058  GLUCAP 275* 127* 222* 271* 242*    Brief HPI:   Nicholas [name] is a 69 y.o. right-handed male with history of hypertension, diabetes mellitus, hepatitis and BPH.   Hospital Course: Nicholas [name] was admitted to rehab 02/02/2020 for inpatient therapies to consist of PT, ST and OT at least three hours five days a week. Past admission physiatrist, therapy team and rehab RN have worked together to provide customized collaborative inpatient rehab.  Per chart review patient lives with wife independent prior to admission.  Presented 01/14/2020 with nausea and headache left-sided weakness.  CT  angiogram of head and neck showed a large volume acute subarachnoid hemorrhage.  Large amount of subarachnoid blood seen in the basilar cisterns and extending into the sylvian fissure.  Negative for hydrocephalus.  Patient underwent cerebral angiogram balloon angioplasty right internal carotid artery middle cerebral artery and balloon angioplasty of basilar artery.  Keppra for seizure prophylaxis.  Echocardiogram with ejection fraction of 70% grade 1 diastolic dysfunction.  Maintained on Nimotop for blood pressure control.  Cardiology service consulted 01/23/2020 for wide-complex tachycardia hypotension placed on amiodarone and cleared for low-dose aspirin.  Latest follow-up MRI of the brain showed mildly increased left subdural hematoma since 01/18/2018 9 mm in thickness.  Small right sided subdural hematoma regressed.  Associated mildly increased right midline shift of 7 mm.  Patient was cleared to initiate Lovenox for DVT prophylaxis.  Palliative care consulted for goals of care.  During patient's rehabilitation stay he was followed by neurosurgery after cerebral angiogram balloon angioplasty with slow overall progress.  Lovenox was initiated for DVT prophylaxis 01/29/2020 no bleeding episodes he did continue low-dose aspirin therapy.  Pain management use of oxycodone changed to Norco due to sedation.  Mood stabilization with Zoloft low-dose amantadine as well as propranolol started 03/02/2020 increased to 30 mg 3 times daily 03/06/2020.  Blood sugars monitored hemoglobin A1c of 7.7 insulin therapy as directed.  Seizure prophylaxis maintained on Keppra he was weaned off no seizure activity.  Hospital course arrhythmia wide-complex tachycardia amiodarone x4 weeks completed 03/01/2020 cardiac rate controlled.  Blood pressure  monitored bouts of orthostasis his nimotop regimen was completed.  Noted bouts of spasticity maintained on baclofen later discontinued.  Patient followed closely by palliative care as well as hospice  consulted with overall slow progress plan was discharged home with hospice care.   Blood pressures were monitored on TID basis and soft and monitored  Diabetes has been monitored with ac/hs CBG checks and SSI was use prn for tighter BS control.    Rehab course: During patient's stay in rehab weekly team conferences were held to monitor patient's progress, set goals and discuss barriers to discharge. At admission, patient required minimal guard mobility +2 physical assist supine to sit +2 physical assist sit to supine.  Max assist upper body bathing moderate assist lower body bathing max is upper body dressing max assist lower body dressing  Physical exam.  Blood pressure 110/60.  Pulse 78 respirations 18 oxygen saturations 92% room air Constitutional no acute distress Head.  Normocephalic and atraumatic Eyes.  Pupils reactive to light with right eye ptosis. Neck.  Supple nontender no JVD without thyromegaly Cardiac regular rate rhythm without any extra sounds or murmur heard Abdomen.  Soft nontender positive bowel sounds without rebound Respiratory effort normal no respiratory distress without wheeze. Musculoskeletal.  Left upper extremity edema.  Patient with right gaze preference left facial droop.  He did follow limited commands.  MotorMotor limited due to limited ability to follow commands right side minimal movement noted upper and lower extremities.  Left upper left lower extremity 0/5.   He/She  has had improvement in activity tolerance, balance, postural control as well as ability to compensate for deficits. He/She has had improvement in functional use RUE/LUE  and RLE/LLE as well as improvement in awareness.  Progress and outcomes severely limited by cognitive factors including attention processing time general awareness safety impairments and poor carryover.  Needed total assist for bed mobility due to short posterior lean and resisting all mobility at times.  Full teaching completed  long conversations held with family regards to palliative hospice care patient discharged home with hospice care.       Disposition: Discharged home    Diet: Diabetic diet  Special Instructions: No smoking or alcohol  Medications at discharge 1.  Amantadine 10 mL twice daily 2.  Aspirin 325 mg daily 3.  Vitamin D daily 4.  Crestor 40 mg daily 5.  Fenofibrate 160 mg daily 6.  NovoLog 5 units 3 times daily with meals 7.  Levemir 10 units twice daily 8.  Lisinopril 5 mg 3 tablets nightly 9.  Propranolol 30 mg 3 times daily 10.  Zoloft 25 mg daily  Discharge Instructions     Ambulatory referral to Physical Medicine Rehab   Complete by: As directed    Moderate complexity follow-up 1 to 2 weeks left greater than right subdural hematoma      Allergies as of 03/07/2020   No Known Allergies      Medication List     STOP taking these medications    Aspirin Adult Low Strength 81 MG EC tablet Generic drug: aspirin Replaced by: aspirin 325 MG tablet   glipiZIDE 10 MG 24 hr tablet Commonly known as: GLUCOTROL XL   insulin aspart 100 UNIT/ML FlexPen Commonly known as: NOVOLOG Replaced by: insulin aspart 100 UNIT/ML injection   omega-3 acid ethyl esters 1 g capsule Commonly known as: Lovaza   pioglitazone 45 MG tablet Commonly known as: ACTOS   sitaGLIPtin-metformin 50-1000 MG tablet Commonly known as: JANUMET  Xigduo XR 06-999 MG Tb24 Generic drug: Dapagliflozin-metFORMIN HCl ER       TAKE these medications    amantadine 50 MG/5ML solution Commonly known as: SYMMETREL Take 10 mLs (100 mg total) by mouth 2 (two) times daily.   aspirin 325 MG tablet Take 1 tablet (325 mg total) by mouth daily. Replaces: Aspirin Adult Low Strength 81 MG EC tablet   cholecalciferol 25 MCG (1000 UNIT) tablet Commonly known as: VITAMIN D3 Take 1,000 Units by mouth daily.   Crestor 40 MG tablet Generic drug: rosuvastatin Take 1 tablet (40 mg total) by mouth daily.    fenofibrate 160 MG tablet Take 1 tablet (160 mg total) by mouth daily.   insulin aspart 100 UNIT/ML injection Commonly known as: novoLOG Inject 5 Units into the skin 3 (three) times daily with meals. Replaces: insulin aspart 100 UNIT/ML FlexPen   insulin detemir 100 UNIT/ML injection Commonly known as: LEVEMIR Inject 0.1 mLs (10 Units total) into the skin 2 (two) times daily. What changed:  how much to take when to take this additional instructions   lisinopril 5 MG tablet Commonly known as: ZESTRIL Take 3 tablets (15 mg total) by mouth at bedtime. What changed:  medication strength how much to take when to take this   Muscle Rub 10-15 % Crea Apply 1 application topically 2 (two) times daily. To neck, lumbar and buttock area.   propranolol 10 MG tablet Commonly known as: INDERAL Take 3 tablets (30 mg total) by mouth 3 (three) times daily.   senna-docusate 8.6-50 MG tablet Commonly known as: Senokot-S Take 1 tablet by mouth 2 (two) times daily.   sertraline 25 MG tablet Commonly known as: ZOLOFT Take 1 tablet (25 mg total) by mouth daily.   simethicone 80 MG chewable tablet Commonly known as: MYLICON Chew 2 tablets (160 mg total) by mouth 4 (four) times daily as needed for flatulence.        Follow-up Information     Jamse Arn, MD Follow up.   Specialty: Physical Medicine and Rehabilitation Why: Office to call for appointment Contact information: [address] [place] [address] [phone]         Pixie Casino, MD Follow up.   Specialty: Cardiology Why: Call for appointment Contact information: [address] [place] [address] [phone]                 Signed: Cathlyn Parsons 03/10/2020, 8:56 AM Patient was seen, face-face, and physical exam performed by me on day of discharge, greater than 30 minutes of total time spent.. Please see progress note from day of discharge as well.  Delice Lesch, MD, ABPMR

## 2020-03-10 NOTE — Progress Notes (Signed)
Physical Therapy Discharge Summary  Patient Details  Name: Nicholas [name] MRN: [id number] Date of Birth: 1950/12/05  Patient has met 0 of 4 long term goals due to functional use of  left upper extremity and left lower extremity however note that progress and outcomes have been severely affected by cognitive factors including attention, processing, general awareness, safety impairments, and poor carryover.  Patient to discharge at a wheelchair level max-total assist of 2 people .   Patient's care partner requires assistance to provide the necessary physical and cognitive assistance at discharge.  Reasons goals not met: Progress and outcomes have been severely limited by cognitive factors including attention, processing time, general awareness, safety impairments, and poor carryover, and patient has had huge amounts of inconsistency from day to day with physical and cognitive performance. Had very considerable functional decline in time between date of initial (but cancelled) DC to and and DC home, which is why he now has not met any goals.   Recommendation:  Patient will benefit from ongoing skilled PT services in home  Setting (if hospice allows)  to continue to advance safe functional mobility, address ongoing impairments in gross functional mobility, strength, pregait and gait skills, attention and processing, WC mobility, and minimize fall risk.  Equipment: Hospital bed, hoyer lift, home healthcare team and 24/7A; per [facility], holding on custom chair pending patient status due to recent severe and significant functional decline- will be available to family for custom chair fabrication later on if patient status improves.   Reasons for discharge: lack of progress toward goals and discharge from hospital  Patient/family agrees with progress made and goals achieved: Yes  PT Discharge Precautions/Restrictions Precautions Precautions: None Precaution Comments: Lt hemiparesis with Lt neglect ;  watch BP Vital Signs Therapy Vitals Temp: 98 F (36.7 C) Pulse Rate: 83 Resp: 17 BP: 114/65 Patient Position (if appropriate): Sitting Oxygen Therapy SpO2: 98 % O2 Device: Room Air Pain Pain Assessment Pain Scale: Faces Faces Pain Scale: Hurts a little bit Pain Type: Chronic pain Pain Location: Back Pain Orientation: Right;Left Pain Descriptors / Indicators: Discomfort Pain Onset: On-going Patients Stated Pain Goal: 0 Pain Intervention(s): Repositioned Multiple Pain Sites: No Vision/Perception  Vision - Assessment Eye Alignment: Impaired (comment) Alignment/Gaze Preference: Gaze right Tracking/Visual Pursuits: Decreased smoothness of horizontal tracking;Decreased smoothness of vertical tracking;Other (comment) Convergence: Impaired (comment) Perception Perception: Impaired Inattention/Neglect: Does not attend to left side of body;Does not attend to left visual field Praxis Praxis: Impaired Praxis Impairment Details: Motor planning;Initiation  Cognition Overall Cognitive Status: Impaired/Different from baseline Arousal/Alertness: Awake/alert Orientation Level: Oriented to person;Oriented to place Attention: Focused Focused Attention: Appears intact Sustained Attention: Impaired Selective Attention: Impaired Memory: Impaired Awareness: Impaired Problem Solving: Impaired Executive Function:  (all impaired due to lower level deficits) Self Monitoring: Impaired Behaviors: Impulsive;Restless Safety/Judgment: Impaired Comments: slow processing, restless, no safety awareness Sensation Sensation Light Touch: Impaired Detail Peripheral sensation comments: unable to accurately assess due to cognition Hot/Cold: Impaired by gross assessment Proprioception: Impaired by gross assessment Stereognosis: Impaired by gross assessment Coordination Gross Motor Movements are Fluid and Coordinated: No Fine Motor Movements are Fluid and Coordinated: No Coordination and Movement  Description: coordination impaired secondary to hemiparesis, has also been having increasing ataxia LLE/LUE Finger Nose Finger Test: impaired Heel Shin Test: impaired/unable to perform due to motor apraxia Motor  Motor Motor: Hemiplegia;Abnormal tone;Abnormal postural alignment and control;Motor apraxia Motor - Skilled Clinical Observations: L hemiparesis LE>UE Motor - Discharge Observations: L hemipareiss LE>UE, ataxia, motor apraxia  Mobility Bed Mobility  Bed Mobility: Rolling Right;Rolling Left;Supine to Sit;Sit to Supine Rolling Right: Moderate Assistance - Patient 50-74% Rolling Left: Moderate Assistance - Patient 50-74% Supine to Sit: 2 Helpers Sit to Supine: 2 Helpers Transfers Transfers: Sit to Stand;Lateral/Scoot Transfers;Stand to [employer] Transfers Sit to Stand: 2 Helpers Stand to Sit: 2 Teacher, music Transfers: 2 Helpers Lateral/Scoot Transfers: 2 Press photographer (Assistive device): Other (Comment) (sliding board) Locomotion  Gait Ambulation: Yes Gait Assistance: 2 Helpers Gait Distance (Feet): 4 Feet Assistive device: Parallel bars Gait Assistance Details: Verbal cues for sequencing;Verbal cues for technique;Verbal cues for precautions/safety;Verbal cues for gait pattern;Manual facilitation for weight shifting;Manual facilitation for placement;Manual facilitation for weight bearing Gait Gait: Yes Gait Pattern: Impaired Gait velocity: decreased Stairs / Additional Locomotion Stairs: No Wheelchair Mobility Wheelchair Mobility: Yes Wheelchair Assistance: Dependent - Patient 0% Wheelchair Parts Management: Needs assistance Distance: dependent for all distances  Trunk/Postural Assessment  Cervical Assessment Cervical Assessment: Exceptions to [facility] (forward head and head rests to R) Thoracic Assessment Thoracic Assessment: Exceptions to [facility] (forward flexed, flexed to R/elongated on L) Lumbar Assessment Lumbar Assessment: Exceptions to [facility] (posterior  pelvic tilt) Postural Control Trunk Control: poor; falls forward and to the left; also pushes toward the left Righting Reactions: delayed/ absent Protective Responses: absent  Balance Balance Balance Assessed: Yes Static Sitting Balance Static Sitting - Level of Assistance: 3: Mod assist Dynamic Sitting Balance Dynamic Sitting - Level of Assistance: 2: Max assist Sitting balance - Comments: fluctuates depending on the day- can need MaxA due to forward and left lean/push Static Standing Balance Static Standing - Level of Assistance: 1: +2 Total assist Dynamic Standing Balance Dynamic Standing - Level of Assistance: 1: +2 Total assist Extremity Assessment  RUE Assessment RUE Assessment: Within Functional Limits General Strength Comments: apraxia, difficult to assess MMT due to cognition LUE Assessment LUE Assessment: Exceptions to [facility] General Strength Comments: apraxia, difficult to assess MMT due to cognition, hemiparetic RLE Assessment RLE Assessment: Within Functional Limits General Strength Comments: apraxia, difficult to assess MMT due to cognition LLE Assessment LLE Assessment: Exceptions to [facility] General Strength Comments: apraxia, difficult to assess MMT due to cognition; esimate based on functional strength 2+/5 to 3-/5 grossly.    Windell Norfolk, DPT, PN1   Supplemental Physical Therapist [facility]    Pager [phone] Acute Rehab Office [phone]

## 2020-03-11 NOTE — Progress Notes (Signed)
Occupational Therapy Discharge Summary  Patient Details  Name: Nicholas [name] MRN: [id number] Date of Birth: Oct 27, 1950  Patient has met 1 of 15 long term goals due to functional use of  LEFT upper extremity.   The initial discharge plan was for the patient to discharge to skilled nursing facility on 03/04/20 for continued skilled therapy and to receive assist from nursing to provide the necessary physical and cognitive assistance at discharge.   The discharge plan changed that day and pt was seen for therapy 1x, with goals downgraded.  He was then discharged to home with his family on 03/07/20.  At this point, it is safest for spouse to do all self care at bed level due to his requiring max A of 2 from w/c level.  Pt is incontinent and not able to safely sit up by himself so use of a BSC and shower chair is not feasible at this time.  Pt's spouse has received education and has been trained with use of a hoyer lift.     Reasons goals not met:  Progress towards goals significantly impacted by cognitive deficits including impaired sequencing, attention including left neglect and impaired divided attention, problem solving, safety awareness, impulse control, orientation, and balance deficits including impaired sitting and standing static and dynamic with intermittent severe left, posterior, and anterior lean with difficulty righting to midline, and motor planning deficits with left ataxic movements.  Pt also exhibits incontinence of bowel and bladder which acts as a barrier to increased independence with toileting.  Recommendation:  Patient will benefit from home health OT services to continue to advance skills in the area of left sided attention, sequencing, safety awareness, LUE motor planning, problem solving, balance and righting reactions, in order to increase BADL independence.  Equipment: No equipment provided  Reasons for discharge: lack of progress toward goals and discharge from  hospital  Patient/family agrees with progress made and goals achieved: Yes  OT Discharge Precautions/Restrictions  Precautions Precaution Comments: Lt hemiparesis with Lt neglect ; watch BP  ADL ADL Eating: Moderate assistance Where Assessed-Eating: Wheelchair Grooming: Minimal assistance Where Assessed-Grooming: Sitting at sink Upper Body Bathing: Minimal cueing, Minimal assistance Where Assessed-Upper Body Bathing: Bed level Lower Body Bathing: Moderate assistance Where Assessed-Lower Body Bathing: Bed level Upper Body Dressing: Moderate assistance Where Assessed-Upper Body Dressing: Wheelchair Lower Body Dressing: Maximal assistance Where Assessed-Lower Body Dressing: Bed level Toileting: Dependent Where Assessed-Toileting: Other (Comment) (Pt incontinent of bowel and bladder) Toilet Transfer: Not assessed Tub/Shower Transfer: Maximal assistance (+ 2) Social research officer, government: Maximal assistance Social research officer, government Method: Other (comment) (roll in shower chair) Youth worker: Other (comment) Vision Baseline Vision/History: Wears glasses Wears Glasses: At all times Patient Visual Report: Other (comment) Vision Assessment?: Yes Ocular Range of Motion: Within Functional Limits Alignment/Gaze Preference: Gaze right Tracking/Visual Pursuits: Decreased smoothness of horizontal tracking;Decreased smoothness of vertical tracking;Other (comment) Saccades: Additional eye shifts occurred during testing;Decreased speed of saccadic movement Perception  Perception: Impaired Inattention/Neglect: Does not attend to left side of body;Does not attend to left visual field Praxis Praxis: Impaired Praxis Impairment Details: Motor planning;Initiation Cognition Overall Cognitive Status: Impaired/Different from baseline Arousal/Alertness: Awake/alert Attention: Focused;Divided;Alternating;Sustained Focused Attention: Appears intact Sustained Attention:  Impaired Sustained Attention Impairment: Verbal basic;Functional basic Selective Attention: Impaired Selective Attention Impairment: Verbal basic;Functional basic Alternating Attention: Impaired Alternating Attention Impairment: Verbal basic;Functional basic Divided Attention: Impaired Divided Attention Impairment: Verbal basic;Functional basic Memory: Impaired Memory Impairment: Storage deficit;Retrieval deficit;Decreased recall of new information Awareness: Impaired Awareness Impairment: Intellectual impairment;Emergent  impairment;Anticipatory impairment Problem Solving: Impaired Problem Solving Impairment: Verbal basic;Functional basic Executive Function: Reasoning;Sequencing;Decision Making;Initiating;Self Correcting Reasoning: Impaired Sequencing: Impaired Sequencing Impairment: Verbal basic;Functional basic Decision Making: Impaired Decision Making Impairment: Verbal basic;Functional basic Initiating: Impaired Initiating Impairment: Verbal basic;Functional basic Self Monitoring: Impaired Self Monitoring Impairment: Verbal basic;Functional basic Self Correcting: Impaired Self Correcting Impairment: Verbal basic;Functional basic Behaviors: Impulsive;Restless Safety/Judgment: Impaired Comments: slow processing, restless, no safety awareness Sensation Sensation Light Touch: Impaired by gross assessment Peripheral sensation comments: unable to accurately assess due to cognition Light Touch Impaired Details: Absent LLE;Impaired LUE Hot/Cold: Impaired by gross assessment Proprioception: Impaired by gross assessment Proprioception Impaired Details: Impaired LUE;Absent LLE Stereognosis: Impaired by gross assessment Coordination Gross Motor Movements are Fluid and Coordinated: No Fine Motor Movements are Fluid and Coordinated: No Coordination and Movement Description: coordination impaired secondary to hemiparesis, has also been having increasing ataxia LLE/LUE Finger Nose  Finger Test: impaired Motor  Motor Motor: Hemiplegia;Abnormal tone;Abnormal postural alignment and control;Motor apraxia Motor - Skilled Clinical Observations: L hemiparesis LE>UE Motor - Discharge Observations: L hemipareiss LE>UE, ataxia, motor apraxia Mobility  Bed Mobility Bed Mobility: Rolling Right;Rolling Left;Supine to Sit;Sit to Supine Rolling Right: Moderate Assistance - Patient 50-74% Rolling Left: Moderate Assistance - Patient 50-74% Supine to Sit: 2 Helpers Sit to Supine: 2 Helpers Transfers Sit to Stand: 2 Helpers Stand to Sit: 2 Helpers  Trunk/Postural Assessment  Cervical Assessment Cervical Assessment: Exceptions to [facility] Cervical AROM Overall Cervical AROM Comments: kyphotic posture Thoracic Assessment Thoracic Assessment: Exceptions to [facility] Thoracic AROM Overall Thoracic AROM: Other (comment) Overall Thoracic AROM Comments: forward flexed; laterally flexed to left Lumbar Assessment Lumbar Assessment: Exceptions to [facility] Lumbar AROM Overall Lumbar AROM: Other (comment) Overall Lumbar AROM Comments: posterior pelvic tilt  Balance Balance Balance Assessed: Yes Static Sitting Balance Static Sitting - Balance Support: No upper extremity supported Static Sitting - Level of Assistance: 3: Mod assist Dynamic Sitting Balance Dynamic Sitting - Balance Support: Bilateral upper extremity supported Dynamic Sitting - Level of Assistance: 2: Max assist Sitting balance - Comments: fluctuates depending on the day- can need MaxA due to forward and left lean/push Static Standing Balance Static Standing - Balance Support: Bilateral upper extremity supported Static Standing - Level of Assistance: 1: +2 Total assist Dynamic Standing Balance Dynamic Standing - Balance Support: Bilateral upper extremity supported Dynamic Standing - Level of Assistance: 1: +2 Total assist Extremity/Trunk Assessment RUE Assessment RUE Assessment: Within Functional Limits LUE Assessment LUE  Assessment: Exceptions to [facility] General Strength Comments: apraxia, difficult to assess MMT due to cognition, hemiparetic LUE Body System: Neuro Brunstrum level for arm: Stage V Relative Independence from [employer]         [place] 03/11/2020, 4:59 PM

## 2020-03-20 ENCOUNTER — Encounter
Payer: BC Managed Care – PPO | Attending: Physical Medicine & Rehabilitation | Admitting: Physical Medicine & Rehabilitation

## 2024-03-17 ENCOUNTER — Encounter (HOSPITAL_COMMUNITY): Payer: Self-pay | Admitting: Interventional Radiology
